# Patient Record
Sex: Female | Born: 1995 | Hispanic: No | Marital: Single | Smoking: Former smoker
Health system: Southern US, Community
[De-identification: ages and names within clinical notes are randomized; demographics above are authoritative.]

## PROBLEM LIST (undated history)

## (undated) ENCOUNTER — Inpatient Hospital Stay (HOSPITAL_COMMUNITY): Payer: Self-pay

## (undated) DIAGNOSIS — R569 Unspecified convulsions: Secondary | ICD-10-CM

## (undated) DIAGNOSIS — A419 Sepsis, unspecified organism: Secondary | ICD-10-CM

## (undated) DIAGNOSIS — I10 Essential (primary) hypertension: Secondary | ICD-10-CM

## (undated) DIAGNOSIS — F419 Anxiety disorder, unspecified: Secondary | ICD-10-CM

## (undated) DIAGNOSIS — J45909 Unspecified asthma, uncomplicated: Secondary | ICD-10-CM

## (undated) DIAGNOSIS — F32A Depression, unspecified: Secondary | ICD-10-CM

## (undated) DIAGNOSIS — F329 Major depressive disorder, single episode, unspecified: Secondary | ICD-10-CM

## (undated) DIAGNOSIS — N12 Tubulo-interstitial nephritis, not specified as acute or chronic: Secondary | ICD-10-CM

## (undated) HISTORY — DX: Essential (primary) hypertension: I10

## (undated) HISTORY — DX: Unspecified convulsions: R56.9

## (undated) HISTORY — PX: NO PAST SURGERIES: SHX2092

## (undated) HISTORY — PX: OTHER SURGICAL HISTORY: SHX169

---
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
## 2017-09-10 ENCOUNTER — Emergency Department (HOSPITAL_BASED_OUTPATIENT_CLINIC_OR_DEPARTMENT_OTHER)
Admission: EM | Admit: 2017-09-10 | Discharge: 2017-09-10 | Disposition: A | Discharge status: Home / Self Care | Payer: Self-pay | Attending: Emergency Medicine | Admitting: Emergency Medicine

## 2017-09-10 ENCOUNTER — Encounter (HOSPITAL_BASED_OUTPATIENT_CLINIC_OR_DEPARTMENT_OTHER): Payer: Self-pay | Admitting: Emergency Medicine

## 2017-09-10 ENCOUNTER — Other Ambulatory Visit: Payer: Self-pay

## 2017-09-10 DIAGNOSIS — M791 Myalgia, unspecified site: Secondary | ICD-10-CM | POA: Insufficient documentation

## 2017-09-10 DIAGNOSIS — R0981 Nasal congestion: Secondary | ICD-10-CM | POA: Insufficient documentation

## 2017-09-10 DIAGNOSIS — B349 Viral infection, unspecified: Secondary | ICD-10-CM | POA: Insufficient documentation

## 2017-09-10 DIAGNOSIS — Z79899 Other long term (current) drug therapy: Secondary | ICD-10-CM | POA: Insufficient documentation

## 2017-09-10 DIAGNOSIS — R112 Nausea with vomiting, unspecified: Secondary | ICD-10-CM | POA: Insufficient documentation

## 2017-09-10 DIAGNOSIS — F17210 Nicotine dependence, cigarettes, uncomplicated: Secondary | ICD-10-CM | POA: Insufficient documentation

## 2017-09-10 LAB — URINALYSIS, ROUTINE W REFLEX MICROSCOPIC
Glucose, UA: NEGATIVE mg/dL
Hgb urine dipstick: NEGATIVE
KETONES UR: 15 mg/dL — AB
Leukocytes, UA: NEGATIVE
NITRITE: NEGATIVE
Protein, ur: 30 mg/dL — AB
Specific Gravity, Urine: 1.01 (ref 1.005–1.030)
pH: 8 (ref 5.0–8.0)

## 2017-09-10 LAB — PREGNANCY, URINE: Preg Test, Ur: NEGATIVE

## 2017-09-10 LAB — URINALYSIS, MICROSCOPIC (REFLEX): RBC / HPF: NONE SEEN RBC/hpf (ref 0–5)

## 2017-09-10 MED ORDER — ONDANSETRON 8 MG PO TBDP
8.0000 mg | ORAL_TABLET | Freq: Once | ORAL | Status: AC
  Administered 2017-09-10: 8 mg via ORAL
  Filled 2017-09-10: qty 1

## 2017-09-10 MED ORDER — ONDANSETRON 8 MG PO TBDP: 8.0000 mg | ORAL_TABLET | Freq: Three times a day (TID) | ORAL | 0 refills | Status: DC | PRN

## 2017-09-10 MED ORDER — IBUPROFEN 400 MG PO TABS
400.0000 mg | ORAL_TABLET | Freq: Once | ORAL | Status: AC
  Administered 2017-09-10: 400 mg via ORAL
  Filled 2017-09-10: qty 1

## 2017-09-10 NOTE — ED Triage Notes (Signed)
Vomiting since 9:30 last night.

## 2017-09-10 NOTE — ED Provider Notes (Signed)
[facility] EMERGENCY DEPARTMENT Provider Note   CSN: [id number] Arrival date & time: 09/10/17  [id number]     History   Chief Complaint Chief Complaint  Patient presents with  . Emesis    HPI EritreaVictoria Michalik is a 22 y.o. female.  Patient with nausea and vomiting in past day. A few episode. Emesis is not bloody or bilious. No diarrhea. Denies abd pain. Also w nasal congestion, and body aches. No fevers. Family member w recent similar symptoms. No known bad food ingestion. Denies headache. Dysuria or gu c/o. Having normal periods.      The history is provided by the patient.  Emesis   Associated symptoms include myalgias. Pertinent negatives include no abdominal pain, no fever and no headaches.    History reviewed. No pertinent past medical history.  There are no active problems to display for this patient.   History reviewed. No pertinent surgical history.  OB History    No data available       Home Medications    Prior to Admission medications   Medication Sig Start Date End Date Taking? Authorizing Provider  norethindrone-ethinyl estradiol (JUNEL FE,GILDESS FE,LOESTRIN FE) 1-20 MG-MCG tablet Take 1 tablet by mouth daily.   Yes [provider]    Family History No family history on file.  Social History Social History   Tobacco Use  . Smoking status: Current Every Day Smoker    Packs/day: 0.50    Types: Cigarettes  . Smokeless tobacco: Never Used  Substance Use Topics  . Alcohol use: Yes    Comment: 2-3 days/week  . Drug use: No     Allergies   Patient has no known allergies.   Review of Systems Review of Systems  Constitutional: Negative for fever.  HENT: Positive for congestion and rhinorrhea. Negative for sore throat.   Eyes: Negative for redness.  Respiratory: Negative for shortness of breath.   Cardiovascular: Negative for chest pain.  Gastrointestinal: Positive for vomiting. Negative for abdominal pain.    Genitourinary: Negative for dysuria and flank pain.  Musculoskeletal: Positive for myalgias. Negative for back pain, neck pain and neck stiffness.  Skin: Negative for rash.  Neurological: Negative for headaches.  Hematological: Does not bruise/bleed easily.  Psychiatric/Behavioral: Negative for confusion.     Physical Exam Updated Vital Signs BP 127/64 (BP Location: Left Arm)   Pulse (!) 112   Temp 98.8 F (37.1 C) (Oral)   Resp 18   Ht 1.702 m (5\' 7" )   Wt 63.5 kg (140 lb)   LMP 09/03/2017 (Approximate)   SpO2 98%   BMI 21.93 kg/m   Physical Exam  Constitutional: She appears well-developed and well-nourished. No distress.  HENT:  Mouth/Throat: Oropharynx is clear and moist.  Nasal congestion  Eyes: Conjunctivae are normal. No scleral icterus.  Neck: Neck supple. No tracheal deviation present.  No stiffness/rigidity  Cardiovascular: Normal rate, regular rhythm, normal heart sounds and intact distal pulses.  No murmur heard. Pulmonary/Chest: Effort normal and breath sounds normal. No respiratory distress.  Abdominal: Soft. Normal appearance. She exhibits no distension. There is no tenderness.  Genitourinary:  Genitourinary Comments: No cva tenderness  Musculoskeletal: She exhibits no edema.  Neurological: She is alert.  Skin: Skin is warm and dry. No rash noted. She is not diaphoretic.  Psychiatric: She has a normal mood and affect.  Nursing note and vitals reviewed.    ED Treatments / Results  Labs (all labs ordered are listed, but only abnormal results are  displayed) Results for orders placed or performed during the hospital encounter of 09/10/17  Urinalysis, Routine w reflex microscopic  Result Value Ref Range   Color, Urine YELLOW YELLOW   APPearance CLEAR CLEAR   Specific Gravity, Urine 1.010 1.005 - 1.030   pH 8.0 5.0 - 8.0   Glucose, UA NEGATIVE NEGATIVE mg/dL   Hgb urine dipstick NEGATIVE NEGATIVE   Bilirubin Urine SMALL (A) NEGATIVE   Ketones, ur 15  (A) NEGATIVE mg/dL   Protein, ur 30 (A) NEGATIVE mg/dL   Nitrite NEGATIVE NEGATIVE   Leukocytes, UA NEGATIVE NEGATIVE  Pregnancy, urine  Result Value Ref Range   Preg Test, Ur NEGATIVE NEGATIVE  Urinalysis, Microscopic (reflex)  Result Value Ref Range   RBC / HPF NONE SEEN 0 - 5 RBC/hpf   WBC, UA 0-5 0 - 5 WBC/hpf   Bacteria, UA RARE (A) NONE SEEN   Squamous Epithelial / LPF 0-5 (A) NONE SEEN    EKG  EKG Interpretation None       Radiology No results found.  Procedures Procedures (including critical care time)  Medications Ordered in ED Medications  ondansetron (ZOFRAN-ODT) disintegrating tablet 8 mg (8 mg Oral Given 09/10/17 0903)  ibuprofen (ADVIL,MOTRIN) tablet 400 mg (400 mg Oral Given 09/10/17 0903)     Initial Impression / Assessment and Plan / ED Course  I have reviewed the triage vital signs and the nursing notes.  Pertinent labs & imaging results that were available during my care of the patient were reviewed by me and considered in my medical decision making (see chart for details).  zofran po.  Po fluids.  Reviewed nursing notes and prior charts for additional history.   Labs reviewed, small ketones on ua.  Pt declines iv. Po fluids.    Pt tolerating po fluids well. abd is soft nt.   Suspect viral illness.  Patient currently appears stable for d/c.     Final Clinical Impressions(s) / ED Diagnoses   Final diagnoses:  None    ED Discharge Orders    None       Cathren Laine, MD 09/10/17 [phone]

## 2017-09-10 NOTE — Discharge Instructions (Signed)
It was our pleasure to provide your ER care today - we hope that you feel better.  Your symptoms appear most consistent with a viral or flu-like illness that should get better over the course of the next few days.  Rest. Drink plenty of fluids.  If nausea, you may take zofran as need.   Take acetaminophen and/or ibuprofen as need for body aches.  Follow up with primary care doctor in 1 week if symptoms fail to improve/resolve.  Return to ER if worse, new symptoms, new or severe  pain, persistent vomiting, trouble breathing, severe abdominal pain, other concern.

## 2017-09-10 NOTE — ED Notes (Signed)
Ginger Ale given  

## 2017-09-10 NOTE — ED Notes (Signed)
ED Provider at bedside. 

## 2018-01-31 ENCOUNTER — Ambulatory Visit: Payer: Managed Care, Other (non HMO) | Admitting: Family Medicine

## 2018-01-31 ENCOUNTER — Telehealth: Payer: Self-pay | Admitting: Family Medicine

## 2018-01-31 ENCOUNTER — Other Ambulatory Visit (HOSPITAL_COMMUNITY)
Admission: RE | Admit: 2018-01-31 | Discharge: 2018-01-31 | Disposition: A | Discharge status: Home / Self Care | Payer: Managed Care, Other (non HMO) | Source: Ambulatory Visit | Attending: Family Medicine | Admitting: Family Medicine

## 2018-01-31 ENCOUNTER — Other Ambulatory Visit: Payer: Self-pay

## 2018-01-31 ENCOUNTER — Encounter: Payer: Self-pay | Admitting: Family Medicine

## 2018-01-31 VITALS — BP 110/80 | HR 86 | Temp 98.9°F | Ht 67.0 in | Wt 148.4 lb

## 2018-01-31 DIAGNOSIS — F411 Generalized anxiety disorder: Secondary | ICD-10-CM | POA: Diagnosis not present

## 2018-01-31 DIAGNOSIS — N6001 Solitary cyst of right breast: Secondary | ICD-10-CM

## 2018-01-31 DIAGNOSIS — Z113 Encounter for screening for infections with a predominantly sexual mode of transmission: Secondary | ICD-10-CM

## 2018-01-31 DIAGNOSIS — Z114 Encounter for screening for human immunodeficiency virus [HIV]: Secondary | ICD-10-CM | POA: Diagnosis not present

## 2018-01-31 DIAGNOSIS — F17290 Nicotine dependence, other tobacco product, uncomplicated: Secondary | ICD-10-CM | POA: Insufficient documentation

## 2018-01-31 DIAGNOSIS — J45909 Unspecified asthma, uncomplicated: Secondary | ICD-10-CM | POA: Insufficient documentation

## 2018-01-31 DIAGNOSIS — Z720 Tobacco use: Secondary | ICD-10-CM | POA: Insufficient documentation

## 2018-01-31 DIAGNOSIS — Z0000 Encounter for general adult medical examination without abnormal findings: Secondary | ICD-10-CM | POA: Insufficient documentation

## 2018-01-31 DIAGNOSIS — B370 Candidal stomatitis: Secondary | ICD-10-CM

## 2018-01-31 DIAGNOSIS — R21 Rash and other nonspecific skin eruption: Secondary | ICD-10-CM

## 2018-01-31 HISTORY — DX: Nicotine dependence, other tobacco product, uncomplicated: F17.290

## 2018-01-31 LAB — TSH: TSH: 2.6 u[IU]/mL (ref 0.35–4.50)

## 2018-01-31 LAB — LIPID PANEL
CHOLESTEROL: 164 mg/dL (ref 0–200)
HDL: 81.4 mg/dL (ref >39.00)
LDL Cholesterol: 58 mg/dL (ref 0–99)
NonHDL: 82.83
TRIGLYCERIDES: 122 mg/dL (ref 0.0–149.0)
Total CHOL/HDL Ratio: 2
VLDL: 24.4 mg/dL (ref 0.0–40.0)

## 2018-01-31 LAB — CBC WITH DIFFERENTIAL/PLATELET
BASOS PCT: 0.7 % (ref 0.0–3.0)
Basophils Absolute: 0 10*3/uL (ref 0.0–0.1)
EOS ABS: 0.1 10*3/uL (ref 0.0–0.7)
Eosinophils Relative: 2.6 % (ref 0.0–5.0)
HEMATOCRIT: 42.5 % (ref 36.0–46.0)
HEMOGLOBIN: 14.5 g/dL (ref 12.0–15.0)
LYMPHS PCT: 24.9 % (ref 12.0–46.0)
Lymphs Abs: 1.2 10*3/uL (ref 0.7–4.0)
MCHC: 34 g/dL (ref 30.0–36.0)
MCV: 107.3 fl — ABNORMAL HIGH (ref 78.0–100.0)
Monocytes Absolute: 0.5 10*3/uL (ref 0.1–1.0)
Monocytes Relative: 9.3 % (ref 3.0–12.0)
Neutro Abs: 3 10*3/uL (ref 1.4–7.7)
Neutrophils Relative %: 62.5 % (ref 43.0–77.0)
Platelets: 215 10*3/uL (ref 150.0–400.0)
RBC: 3.97 Mil/uL (ref 3.87–5.11)
RDW: 18.4 % — AB (ref 11.5–15.5)
WBC: 4.9 10*3/uL (ref 4.0–10.5)

## 2018-01-31 LAB — COMPREHENSIVE METABOLIC PANEL
ALBUMIN: 4.5 g/dL (ref 3.5–5.2)
ALT: 19 U/L (ref 0–35)
AST: 54 U/L — AB (ref 0–37)
Alkaline Phosphatase: 68 U/L (ref 39–117)
BUN: 4 mg/dL — ABNORMAL LOW (ref 6–23)
CALCIUM: 9.5 mg/dL (ref 8.4–10.5)
CHLORIDE: 98 meq/L (ref 96–112)
CO2: 29 mEq/L (ref 19–32)
CREATININE: 0.62 mg/dL (ref 0.40–1.20)
GFR: 128.92 mL/min (ref >60.00)
Glucose, Bld: 83 mg/dL (ref 70–99)
POTASSIUM: 3 meq/L — AB (ref 3.5–5.1)
Sodium: 141 mEq/L (ref 135–145)
Total Bilirubin: 0.6 mg/dL (ref 0.2–1.2)
Total Protein: 7.2 g/dL (ref 6.0–8.3)

## 2018-01-31 MED ORDER — NORETHIN ACE-ETH ESTRAD-FE 1-20 MG-MCG PO TABS: 1.0000 | ORAL_TABLET | Freq: Every day | ORAL | 3 refills | Status: DC

## 2018-01-31 MED ORDER — TRIAMCINOLONE ACETONIDE 0.1 % EX CREA: TOPICAL_CREAM | CUTANEOUS | 1 refills | Status: DC

## 2018-01-31 MED ORDER — NYSTATIN 100000 UNIT/ML MT SUSP: 5.0000 mL | Freq: Four times a day (QID) | OROMUCOSAL | 0 refills | Status: DC

## 2018-01-31 MED ORDER — HYDROXYZINE HCL 25 MG PO TABS: ORAL_TABLET | ORAL | 3 refills | Status: DC

## 2018-01-31 NOTE — Telephone Encounter (Signed)
Copied from CRM [phone]#[id number]. Topic: Quick Communication - See Telephone Encounter >> Jan 31, 2018 12:48 PM Lorrine KinMcGee, Warner Laduca B, NT wrote: CRM for notification. See Telephone encounter for: 01/31/18. Patient's grandmother calling and states that the patient was seen this morning and Dr Artis FlockWolfe told the patient that she would sent something to the pharmacy for vaginal yeast. Patient went to pick up medication and there was nothing therre for her yeast infection. Please advise. [id number] - [place], [place] - [address]  CB#:[phone]

## 2018-01-31 NOTE — Progress Notes (Signed)
Patient: Natasha Pope MRN: [id number] DOB: [birth year] PCP: Orma Flaming, MD     Subjective:  Chief Complaint  Patient presents with  . Establish Care    requesting cpe    HPI: The patient is a 22 y.o. female who presents today for annual exam. She denies any changes to past medical history. There have been no recent hospitalizations. They are following a well balanced diet and exercise plan. Weight has been stable. No complaints today. She just moved from [place], [place]. Looking for jobs and planning to enroll in some college classes. She has concerns for anxiety.   Anxiety: She first noticed this about one year, but just recently got intense. Grandma states she has had anxiety since she was 22 years old. She has had a lot of changes with her parents getting a divorce and moving from [place] to [place]. She is not sure what triggers her anxiety. She has called her grandma crying and can't get out of bed. Not sure why she is crying because she doesn't feel sad. Family history of possible depression in parents, but unsure if this is a clinical diagnosis or just family diagnosis. No SI/HI/Ah/VH. She does take walks when she is feeling anxious. She does not exercise regularly. She does have panic attacks and gets shaky, sweaty, chest pressure, fast heart rate. She states they can last anywhere from short to 1.5 hours. She has never been on any medication for depression or anxiety. She has  Done counseling in the past and didn't really think it helped her anxiety.   Rash: she has had a rash for at least 2 months that started out on her neck and then spread diffusely over her body to her breasts, abdomen. It was purple/dark in color and in "spots." her neck had a scaly appearance to it as well. It really itched her and it burned. It is getting much better.   Right areola issue: she noticed a few days ago that she has little bump on the medial aspect of her right nipple. It looked like  a little bubble and was painful.   Immunization History  Administered Date(s) Administered  . DTaP 01/19/1997, 04/01/1997, 05/31/1997, 01/13/2002  . Hepatitis B 11/18/1996, 01/19/1997, 04/01/1997, 05/31/1997  . HiB (PRP-OMP) 01/19/1997, 04/01/1997  . IPV 01/19/1997, 04/01/1997, 01/13/2002  . MMR 01/13/2002, 03/08/2008  . Meningococcal Conjugate 03/08/2008  . Td 03/08/2008  . Tdap 03/08/2008  . Varicella 03/08/2008    Pap smear: 2016 Tdap: declined today  hiv screen: never, today  Gc/c screen: today    Review of Systems  Constitutional: Negative for activity change, appetite change and fatigue.  Respiratory: Negative for shortness of breath.   Cardiovascular: Negative for chest pain.  Gastrointestinal: Negative for abdominal pain, nausea and vomiting.  Genitourinary: Negative.   Musculoskeletal: Negative for back pain and neck pain.  Neurological: Positive for dizziness. Negative for headaches.  Psychiatric/Behavioral: The patient is nervous/anxious.     Allergies Patient has No Known Allergies.  Past Medical History Patient  has no past medical history on file.  Surgical History Patient  has no past surgical history on file.  Family History [name]'s family history includes Alcohol abuse in her father, maternal grandfather, and paternal grandmother; Depression in her mother; Hyperlipidemia in her maternal grandmother; Hypertension in her father and paternal grandfather; Learning disabilities in her sister; Miscarriages / Stillbirths in her mother.  Social History Patient  reports that she has been smoking cigarettes.  She has been smoking about 0.50  packs per day. She has never used smokeless tobacco. She reports that she drinks alcohol. She reports that she does not use drugs.    Objective: Vitals:   01/31/18 0844  BP: 110/80  Pulse: 86  Temp: 98.9 F (37.2 C)  TempSrc: Oral  SpO2: 98%  Weight: 148 lb 6.4 oz (67.3 kg)  Height: '5\' 7"'$  (1.702 m)    Body mass  index is 23.24 kg/m.  Physical Exam  Constitutional: She is oriented to person, place, and time. She appears well-developed and well-nourished.  HENT:  Right Ear: External ear normal.  Left Ear: External ear normal.  Mouth/Throat: Oropharynx is clear and moist.  TM pearly with light reflex bilaterally  thrush on tongue   Eyes: Pupils are equal, round, and reactive to light. Conjunctivae and EOM are normal.  Neck: Normal range of motion. Neck supple. No thyromegaly present.  Cardiovascular: Normal rate, regular rhythm, normal heart sounds and intact distal pulses.  No murmur heard. Pulmonary/Chest: Effort normal and breath sounds normal.  Abdominal: Soft. Bowel sounds are normal. She exhibits no distension. There is no tenderness.  Lymphadenopathy:    She has no cervical adenopathy.  Neurological: She is alert and oriented to person, place, and time. She displays normal reflexes. No cranial nerve deficit. Coordination normal.  Skin: Skin is warm and dry. Rash noted.  She has a slightly scaly, plaque like rash with mild violet color all around her neck. Other areas of rash have cleared up.   Right areola: very small <10m enlarged areolar gland medial aspect of right areola   Psychiatric: She has a normal mood and affect. Her behavior is normal.  Vitals reviewed.      GAD 7 : Generalized Anxiety Score 01/31/2018  Nervous, Anxious, on Edge 2  Control/stop worrying 1  Worry too much - different things 1  Trouble relaxing 1  Restless 2  Easily annoyed or irritable 1  Afraid - awful might happen 1  Total GAD 7 Score 9  Anxiety Difficulty Somewhat difficult    Dep[facility] screen [facility] 01/31/2018 01/31/2018  Decreased Interest 0 -  Down, Depressed, Hopeless 2 1  PHQ - 2 Score 2 1  Altered sleeping 2 -  Tired, decreased energy 1 -  Change in appetite 0 -  Feeling bad or failure about yourself  1 -  Trouble concentrating 0 -  Moving slowly or fidgety/restless 1 -  Suicidal  thoughts 0 -  PHQ-9 Score 7 -  Difficult doing work/chores Not difficult at all -     Assessment/plan: 1. Encounter for screening for HIV  - HIV antibody  2. Annual physical exam Routine labs. Declined tdap today due to fear. Will try again next time. Needs pap at next visit.  Patient counseling '[x]'$    Nutrition: Stressed importance of moderation in sodium/caffeine intake, saturated fat and cholesterol, caloric balance, sufficient intake of fresh fruits, vegetables, fiber, calcium, iron, and 1 mg of folate supplement per day (for females capable of pregnancy).  '[x]'$    Stressed the importance of regular exercise.   '[x]'$    Substance Abuse: Discussed cessation/primary prevention of tobacco, alcohol, or other drug use; driving or other dangerous activities under the influence; availability of treatment for abuse.   '[x]'$    Injury prevention: Discussed safety belts, safety helmets, smoke detector, smoking near bedding or upholstery.   '[x]'$    Sexuality: Discussed sexually transmitted diseases, partner selection, use of condoms, avoidance of unintended pregnancy  and contraceptive alternatives.  '[x]'$   Dental health: Discussed importance of regular tooth brushing, flossing, and dental visits.  '[x]'$    Health maintenance and immunizations reviewed. Please refer to Health maintenance section.    - Comprehensive metabolic panel - CBC with Differential/Platelet - TSH - Lipid panel  3. Screen for STD (sexually transmitted disease)  - Urine cytology ancillary only; Future - Urine cytology ancillary only  4. GAD (generalized anxiety disorder) I feel like her anxiety is much worse than what her GAD7 score is showing.   She has a score of 9, but is telling me it affects her daily and has bad anxiety. We are going to start her on an as needed medication as she is very hesitant to start a daily medication, even though I think she really needs this. Recommended counseling, but she is not interested in this  either. Recommended daily exercise/yoga as this is the best thing she can do besides counseling for anxiety. I do feel like a lot of this stems from family issues and all of the changes she has been through. Im going to give her a month to see how the hydroxyzine helps her, but if not better in one month am really going to encourage we start a SSRI/daily medication to help with this.   5. Rash I think she has a few different things going on. Rash on body has almost disappeared, but her neck looks more like eczema/dermatitis. We are going to do steroid cream BID and emollient. Will follow up on this in one month. Discussed not to use on face or for prolonged stretches of time.   6. Cyst of right breast Not really a cyst. It's an enlarged areolar gland. Warm compresses TID. If not getting better, growing, more painful she is to return so we can drain.   7. Oral thrush Nystatin oral QID.     Return in about 1 month (around 03/03/2018) for rash/anxiety/pap smear. 45 minutes please .     Orma Flaming, MD [place]  01/31/2018

## 2018-01-31 NOTE — Telephone Encounter (Signed)
See CRM.  Incorrect information in message.  Spoke to patient's TEPPCO Partnersgrandmother-Natasha Pope.  Original message stated that "Dr. Artis FlockWolfe told patient that she would send something to the pharmacy for *vaginal yeast* .  Patient's grandmother confirmed that was incorrect info and that rx was supposed to be sent to pharmacy for oral thrush (yeast).  Spoke to Dr. Artis FlockWolfe and [facility] [address]  Pt's grandmother aware.

## 2018-01-31 NOTE — Patient Instructions (Signed)
Steroid cream: put on neck twice a day for up to 7 days then off for 7 days. Can also use cetaphil cream to help.    Hydroxyzine pill. 1/2-1 tablet up to three times a day as needed for anxiety.

## 2018-02-01 LAB — HIV ANTIBODY (ROUTINE TESTING W REFLEX): HIV: NONREACTIVE

## 2018-02-03 ENCOUNTER — Other Ambulatory Visit: Payer: Self-pay | Admitting: Family Medicine

## 2018-02-03 DIAGNOSIS — D7589 Other specified diseases of blood and blood-forming organs: Secondary | ICD-10-CM

## 2018-02-03 LAB — URINE CYTOLOGY ANCILLARY ONLY
CHLAMYDIA, DNA PROBE: NEGATIVE
Neisseria Gonorrhea: NEGATIVE

## 2018-03-03 ENCOUNTER — Other Ambulatory Visit: Payer: Self-pay | Admitting: Family Medicine

## 2018-03-03 ENCOUNTER — Ambulatory Visit: Payer: Managed Care, Other (non HMO)

## 2018-03-03 ENCOUNTER — Ambulatory Visit (INDEPENDENT_AMBULATORY_CARE_PROVIDER_SITE_OTHER): Payer: Managed Care, Other (non HMO) | Admitting: Family Medicine

## 2018-03-03 ENCOUNTER — Telehealth: Payer: Self-pay | Admitting: Family Medicine

## 2018-03-03 ENCOUNTER — Encounter: Payer: Self-pay | Admitting: Family Medicine

## 2018-03-03 ENCOUNTER — Other Ambulatory Visit: Payer: Managed Care, Other (non HMO)

## 2018-03-03 ENCOUNTER — Other Ambulatory Visit (HOSPITAL_COMMUNITY)
Admission: RE | Admit: 2018-03-03 | Discharge: 2018-03-03 | Disposition: A | Discharge status: Home / Self Care | Payer: Managed Care, Other (non HMO) | Source: Ambulatory Visit | Attending: Family Medicine | Admitting: Family Medicine

## 2018-03-03 ENCOUNTER — Telehealth: Payer: Self-pay

## 2018-03-03 VITALS — BP 132/90 | HR 74 | Temp 98.1°F | Ht 67.0 in | Wt 149.2 lb

## 2018-03-03 DIAGNOSIS — F411 Generalized anxiety disorder: Secondary | ICD-10-CM | POA: Diagnosis not present

## 2018-03-03 DIAGNOSIS — E876 Hypokalemia: Secondary | ICD-10-CM

## 2018-03-03 DIAGNOSIS — E875 Hyperkalemia: Secondary | ICD-10-CM

## 2018-03-03 DIAGNOSIS — D7589 Other specified diseases of blood and blood-forming organs: Secondary | ICD-10-CM

## 2018-03-03 DIAGNOSIS — Z124 Encounter for screening for malignant neoplasm of cervix: Secondary | ICD-10-CM

## 2018-03-03 DIAGNOSIS — R748 Abnormal levels of other serum enzymes: Secondary | ICD-10-CM

## 2018-03-03 DIAGNOSIS — R87610 Atypical squamous cells of undetermined significance on cytologic smear of cervix (ASC-US): Secondary | ICD-10-CM | POA: Insufficient documentation

## 2018-03-03 LAB — COMPREHENSIVE METABOLIC PANEL
ALBUMIN: 4.5 g/dL (ref 3.5–5.2)
ALT: 24 U/L (ref 0–35)
AST: 91 U/L — ABNORMAL HIGH (ref 0–37)
Alkaline Phosphatase: 86 U/L (ref 39–117)
BUN: 4 mg/dL — ABNORMAL LOW (ref 6–23)
CALCIUM: 9.6 mg/dL (ref 8.4–10.5)
CHLORIDE: 97 meq/L (ref 96–112)
CO2: 33 meq/L — AB (ref 19–32)
CREATININE: 0.87 mg/dL (ref 0.40–1.20)
GFR: 87.13 mL/min (ref >60.00)
Glucose, Bld: 95 mg/dL (ref 70–99)
POTASSIUM: 2.8 meq/L — AB (ref 3.5–5.1)
Sodium: 140 mEq/L (ref 135–145)
Total Bilirubin: 1.3 mg/dL — ABNORMAL HIGH (ref 0.2–1.2)
Total Protein: 7.1 g/dL (ref 6.0–8.3)

## 2018-03-03 LAB — FOLATE: FOLATE: 3.2 ng/mL — AB (ref >5.9)

## 2018-03-03 LAB — VITAMIN B12: VITAMIN B 12: 217 pg/mL (ref 211–911)

## 2018-03-03 MED ORDER — POTASSIUM CHLORIDE ER 10 MEQ PO TBCR: 10.0000 meq | EXTENDED_RELEASE_TABLET | Freq: Two times a day (BID) | ORAL | 0 refills | Status: DC

## 2018-03-03 MED ORDER — FOLIC ACID 1 MG PO TABS: 1.0000 mg | ORAL_TABLET | Freq: Every day | ORAL | 1 refills | Status: DC

## 2018-03-03 NOTE — Telephone Encounter (Signed)
CRITICAL VALUE STICKER  CRITICAL VALUE: 2.8  RECEIVER (on-site recipient of call): [employer]Callie Pope.  Verbal result given to [facility], CMA.    DATE & TIME NOTIFIED: 03/03/18 @ 3:20 pm  MESSENGER (representative from Pope): Natasha [facility]  MD NOTIFIED: yes-Dr. Orland MustardAllison Pope  TIME OF NOTIFICATION: 3:20 p.m.  RESPONSE:

## 2018-03-03 NOTE — Addendum Note (Signed)
Addended by: Olevia BowensZELLMER, Connar Keating M on: 03/03/2018 04:50 PM   Modules accepted: Orders

## 2018-03-03 NOTE — Telephone Encounter (Signed)
Pt notified verbally in person that potassium [facility].

## 2018-03-03 NOTE — Progress Notes (Signed)
Patient: Natasha Pope MRN: [id number] DOB: 05/13/1996 PCP: Orland MustardWolfe, Ahlia Lemanski, MD     Subjective:  Chief Complaint  Patient presents with  . Follow-up    anxiety  . Gynecologic Exam    HPI: The patient is a 22 y.o. female who presents today for anxiety and pap smear as well as follow up for some labs.   Anxiety: I saw her a month ago and her GAd7 score was a 9; however, her anxiety seemed to be much worse than this on a day to day basis. She feels like her anxiety is much better. We started her on hydroxyzine and she really thinks this has helped her. She can take it at work and does well and it doesn't make her drowsy. Her grandmother is with her and can tell a big difference.  She also has a job now.   Gyn history: She had a pap smear in 2016. She states she has had 2 or 3 of them 6 months apart. Menarche age 22 or [age]. Periods are monthly and last for a few days. Period is well controlled on her ocp. She takes her ocp daily at the same time. She went on the ocp for painful periods. Sexually active with one person. Does not use condoms. 1/2 and 1/2. In monogamous relationship for 3-4 years. Gc/C were negative. No vaginal complaints, no pain with sex and no urinary issues. No breast cancer or colon cancer in the family.   Elevated liver enzymes: she had some elevated AST and macrocytosis. She drank a lot of liquor about 2 years ago and drink heavy nightly about one year ago when she was living with her father. She moved out of his house a few months ago and cut down to 1-2 beers/night. When I got her labs back, she cut this out as well. She drinks about one beer every few nights. Her dad is likely an alcoholic and his mother also had an issue with drugs/alcholism. She denies tylenol use and has not used any tylenol since I got her labs back last month.   Headache: she has had a headache for a week. Right temple and radiates to the back of her head. Comes and goes. Not worst headache of  her life, does not wake her up and no focal deficits. Has not taken any medication for this.   Review of Systems  Constitutional: Positive for fatigue.  Respiratory: Negative for shortness of breath.   Cardiovascular: Negative for chest pain.  Gastrointestinal: Positive for diarrhea. Negative for abdominal pain, blood in stool, nausea and vomiting.  Neurological: Positive for headaches. Negative for dizziness.  Psychiatric/Behavioral: Positive for sleep disturbance. The patient is not nervous/anxious.     Allergies Patient has No Known Allergies.  Past Medical History Patient  has no past medical history on file.  Surgical History Patient  has no past surgical history on file.  Family History [name]'s family history includes Alcohol abuse in her father, maternal grandfather, and paternal grandmother; Depression in her mother; Hyperlipidemia in her maternal grandmother; Hypertension in her father and paternal grandfather; Learning disabilities in her sister; Miscarriages / Stillbirths in her mother.  Social History Patient  reports that she has been smoking cigarettes. She has been smoking about 0.50 packs per day. She has never used smokeless tobacco. She reports that she drinks alcohol. She reports that she does not use drugs.    Objective: Vitals:   03/03/18 1002  BP: 132/90  Pulse: 74  Temp: 98.1  F (36.7 C)  TempSrc: Oral  SpO2: 98%  Weight: 149 lb 3.2 oz (67.7 kg)  Height: 5\' 7"  (1.702 m)    Body mass index is 23.37 kg/m.  Physical Exam  Constitutional: She is oriented to person, place, and time. She appears well-developed and well-nourished.  Neck: Normal range of motion. Neck supple. No thyromegaly present.  Cardiovascular: Normal rate, regular rhythm and normal heart sounds.  Pulmonary/Chest: Effort normal and breath sounds normal.  Abdominal: Soft. Bowel sounds are normal.  Genitourinary: Rectum normal, vagina normal and uterus normal. Cervix exhibits  friability. Cervix exhibits no motion tenderness and no discharge. Right adnexum displays no tenderness. Left adnexum displays no tenderness. No vaginal discharge found.  Neurological: She is alert and oriented to person, place, and time.  Skin: Skin is warm and dry. No rash noted.  Vitals reviewed.   GAD 7 : Generalized Anxiety Score 03/03/2018 01/31/2018  Nervous, Anxious, on Edge 1 2  Control/stop worrying 0 1  Worry too much - different things 0 1  Trouble relaxing 0 1  Restless 0 2  Easily annoyed or irritable 1 1  Afraid - awful might happen 0 1  Total GAD 7 Score 2 9  Anxiety Difficulty Somewhat difficult Somewhat difficult    .    Assessment/plan: 1. GAD (generalized anxiety disorder) GAD7 score significantly improved. She is doing well with prn hydroxyzine and even her grandmother agrees. She is well controlled on current prn medication. Want her to keep working on exercise and discussed counseling again. If seems to be uncontrolled can return; otherwise f/u in 6-12 months.   2. Cervical cancer screening Gc/c already checked and negative. Pap today with reflux ascus. Discussed safe sex and that I really want her to use condoms to help decrease risk of hpv, herpes, hiv, etc... - Cytology - PAP  3. Elevated liver enzymes Repeat today. **after labs returned AST trending upwards despite her saying she is not drinking. Will try to add on hepatitis panel and check liver ultrasound. Again abstain from tylenol and all alcohol.  - Comprehensive metabolic panel - Hepatic function panel  4. Macrocytosis Checking folate and b12. Folate found to be decreased. Likely from drinking. Starting folic acid daily. Repeat in 3 months.  - Folate - Vitamin B12  5. Hypokalemia After labs found to have critical potassium at 2.8. I had her come back and repeat potassium as well as check ekg. ekg wnl and repeat potassium was 3.2. I sent in replacement to take daily x 3 days. Will need to watch  this. Increase potassium in diet and may need to work this up.  - EKG 12-Lead - Hepatic function panel  6. Low serum potassium See above.  - Basic metabolic panel      Return in about 6 months (around 09/03/2018).     Orland MustardAllison Alyxandria Wentz, MD [place] [facility]   03/03/2018

## 2018-03-03 NOTE — Telephone Encounter (Signed)
[facility] called with critical lab result. Potassium of 2.8

## 2018-03-03 NOTE — Progress Notes (Signed)
Repeat potassium today. If still low needs to go to ER.

## 2018-03-03 NOTE — Telephone Encounter (Signed)
Hey jen- Can you have her come back for a repeat draw today? She was asymptomatic. We can do EKG as well. We need to send lab stat if we can and if still low will need to go to ER. I put in order for another lab today and ekg.

## 2018-03-03 NOTE — Progress Notes (Signed)
[facility] wnl. Repeat potassium was much improved at 3.2. Sent in 3 days of potassium replacement which was already discussed with patient. Please see lab notes for rest of her labs.

## 2018-03-03 NOTE — Telephone Encounter (Signed)
Called and spoke to patient/grandmother.  Advised of critically low potassium level.  Explained to pt that she needs to come back in to office for repeat potassium level as well as EKG.  Explained that if they could not get here to office before we close at 5 pm, would need to go directly to ED.  Pt's grandmother verbalized understanding.  Will head over to office now.

## 2018-03-03 NOTE — Telephone Encounter (Signed)
Let her know I sent oral potassium replacement over to pharmacy. She will take two/day for the next 3 days. If potassium is critically low again will need to go to ER

## 2018-03-03 NOTE — Patient Instructions (Signed)
Tension Headache A tension headache is pain, pressure, or aching that is felt over the front and sides of your head. These headaches can last from 30 minutes to several days. Follow these instructions at home: Managing pain  Take over-the-counter and prescription medicines only as told by your doctor.  Lie down in a dark, quiet room when you have a headache.  If directed, apply ice to your head and neck area: ? Put ice in a plastic bag. ? Place a towel between your skin and the bag. ? Leave the ice on for 20 minutes, 2-3 times per day.  Use a heating pad or a hot shower to apply heat to your head and neck area as told by your doctor. Eating and drinking  Eat meals on a regular schedule.  Do not drink a lot of alcohol.  Do not use a lot of caffeine, or stop using caffeine. General instructions  Keep all follow-up visits as told by your doctor. This is important.  Keep a journal to find out if certain things bring on headaches. For example, write down: ? What you eat and drink. ? How much sleep you get. ? Any change to your diet or medicines.  Try getting a massage, or doing other things that help you to relax.  Lessen stress.  Sit up straight. Do not tighten (tense) your muscles.  Do not use tobacco products. This includes cigarettes, chewing tobacco, or e-cigarettes. If you need help quitting, ask your doctor.  Exercise regularly as told by your doctor.  Get enough sleep. This may mean 7-9 hours of sleep. Contact a doctor if:  Your symptoms are not helped by medicine.  You have a headache that feels different from your usual headache.  You feel sick to your stomach (nauseous) or you throw up (vomit).  You have a fever. Get help right away if:  Your headache becomes very bad.  You keep throwing up.  You have a stiff neck.  You have trouble seeing.  You have trouble speaking.  You have pain in your eye or ear.  Your muscles are weak or you lose muscle  control.  You lose your balance or you have trouble walking.  You feel like you will pass out (faint) or you pass out.  You have confusion. This information is not intended to replace advice given to you by your health care provider. Make sure you discuss any questions you have with your health care provider. Document Released: 10/03/2009 Document Revised: 03/08/2016 Document Reviewed: 11/01/2014 Elsevier Interactive Patient Education  2018 [employer]  

## 2018-03-03 NOTE — Progress Notes (Signed)
Per orders of Dr. Artis FlockWolfe, pt returned this afternoon 03/03/18 for an ekg and repeat STAT potassium level as well as hepatitis panel due to critically low potassium drawn this morning when patient was seen for office visit.  Results of EKG reviewed with Dr. Artis FlockWolfe.

## 2018-03-04 NOTE — Addendum Note (Signed)
Addended by: Olevia BowensZELLMER, JENNIFER M on: 03/04/2018 09:08 AM   Modules accepted: Orders

## 2018-03-04 NOTE — Progress Notes (Signed)
I called [facility] to add on acute hepatitis panel and the magnesium. If not enough blood was sent they will call and notify.

## 2018-03-05 ENCOUNTER — Other Ambulatory Visit: Payer: Self-pay | Admitting: Family Medicine

## 2018-03-05 DIAGNOSIS — R748 Abnormal levels of other serum enzymes: Secondary | ICD-10-CM

## 2018-03-05 LAB — BASIC METABOLIC PANEL
BUN / CREAT RATIO: 5 (calc) — AB (ref 6–22)
BUN: 4 mg/dL — ABNORMAL LOW (ref 7–25)
CHLORIDE: 98 mmol/L (ref 98–110)
CO2: 32 mmol/L (ref 20–32)
CREATININE: 0.78 mg/dL (ref 0.50–1.10)
Calcium: 9.3 mg/dL (ref 8.6–10.2)
GLUCOSE: 100 mg/dL — AB (ref 65–99)
Potassium: 3.2 mmol/L — ABNORMAL LOW (ref 3.5–5.3)
Sodium: 139 mmol/L (ref 135–146)

## 2018-03-05 LAB — TEST AUTHORIZATION 2

## 2018-03-05 LAB — TEST AUTHORIZATION

## 2018-03-05 LAB — HEPATIC FUNCTION PANEL
AG RATIO: 1.7 (calc) (ref 1.0–2.5)
ALBUMIN MSPROF: 4.3 g/dL (ref 3.6–5.1)
ALKALINE PHOSPHATASE (APISO): 88 U/L (ref 33–115)
ALT: 21 U/L (ref 6–29)
AST: 67 U/L — AB (ref 10–30)
Bilirubin, Direct: 0.3 mg/dL — ABNORMAL HIGH (ref 0.0–0.2)
Globulin: 2.5 g/dL (calc) (ref 1.9–3.7)
Indirect Bilirubin: 0.6 mg/dL (calc) (ref 0.2–1.2)
TOTAL PROTEIN: 6.8 g/dL (ref 6.1–8.1)
Total Bilirubin: 0.9 mg/dL (ref 0.2–1.2)

## 2018-03-05 LAB — HEPATITIS PANEL, ACUTE
HEP A IGM: NONREACTIVE
HEP B S AG: NONREACTIVE
Hep B C IgM: NONREACTIVE
Hepatitis C Ab: NONREACTIVE
SIGNAL TO CUT-OFF: 0.03 (ref <1.00)

## 2018-03-05 LAB — MAGNESIUM: Magnesium: 1.7 mg/dL (ref 1.5–2.5)

## 2018-03-07 ENCOUNTER — Encounter: Payer: Self-pay | Admitting: Family Medicine

## 2018-03-07 ENCOUNTER — Other Ambulatory Visit: Payer: Self-pay

## 2018-03-07 DIAGNOSIS — E876 Hypokalemia: Secondary | ICD-10-CM

## 2018-03-07 DIAGNOSIS — E538 Deficiency of other specified B group vitamins: Secondary | ICD-10-CM

## 2018-03-07 DIAGNOSIS — D539 Nutritional anemia, unspecified: Secondary | ICD-10-CM

## 2018-03-07 DIAGNOSIS — R87610 Atypical squamous cells of undetermined significance on cytologic smear of cervix (ASC-US): Secondary | ICD-10-CM | POA: Insufficient documentation

## 2018-03-07 LAB — CYTOLOGY - PAP
Diagnosis: UNDETERMINED — AB
HPV: NOT DETECTED

## 2018-04-08 ENCOUNTER — Telehealth: Payer: Self-pay | Admitting: Family Medicine

## 2018-04-08 NOTE — Telephone Encounter (Signed)
Copied from CRM [id number]. Topic: Quick Communication - See Telephone Encounter >> Apr 08, 2018  4:04 PM Luanna Coleawoud, Jessica L wrote: CRM for notification. See Telephone encounter for: 04/08/18. Pt called and stated that she is feeling tired. Pt states its hard to get out of bed. Pt would like to talk to Orland Mustardallison Wolfe about this. Pt states she needs some help. She feels like her mind and body is slow. Pt would like energy pills Please advise.

## 2018-04-08 NOTE — Telephone Encounter (Signed)
Called and spoke with patient. She endorses all symptoms in previous call. She states that she really needs an energy pill. She has tried the 5 hr energy drinks and energy pills from stores and she always vomits after taking them. She does endorse intermittent depression and anxiety. She states she has woken up in the middle of the night and can't breath. She denies any suicidal or homicidal thoughts. I explained that Dr Artis FlockWolfe would want to see her prior to recommending any treatment. Patient agreed to schedule an appt for tomorrow morning.

## 2018-04-09 ENCOUNTER — Ambulatory Visit: Payer: Managed Care, Other (non HMO) | Admitting: Family Medicine

## 2018-04-09 DIAGNOSIS — Z0289 Encounter for other administrative examinations: Secondary | ICD-10-CM

## 2018-04-09 NOTE — Telephone Encounter (Signed)
Noted.  Pt has appt today 9/18 @ 9:15 am.

## 2018-04-10 ENCOUNTER — Ambulatory Visit: Payer: Managed Care, Other (non HMO) | Admitting: Family Medicine

## 2018-04-14 ENCOUNTER — Other Ambulatory Visit: Payer: Self-pay | Admitting: Family Medicine

## 2018-04-17 ENCOUNTER — Encounter: Payer: Self-pay | Admitting: Family Medicine

## 2018-05-19 ENCOUNTER — Ambulatory Visit: Payer: Self-pay

## 2018-05-19 ENCOUNTER — Telehealth: Payer: Self-pay | Admitting: Family Medicine

## 2018-05-19 ENCOUNTER — Other Ambulatory Visit: Payer: Self-pay

## 2018-05-19 ENCOUNTER — Encounter: Payer: Self-pay | Admitting: Family Medicine

## 2018-05-19 ENCOUNTER — Emergency Department (HOSPITAL_COMMUNITY): Payer: Managed Care, Other (non HMO)

## 2018-05-19 ENCOUNTER — Other Ambulatory Visit: Payer: Self-pay | Admitting: Family Medicine

## 2018-05-19 ENCOUNTER — Inpatient Hospital Stay (HOSPITAL_COMMUNITY)
Admission: EM | Admit: 2018-05-19 | Discharge: 2018-05-23 | DRG: 872 | Disposition: A | Discharge status: Home / Self Care | Payer: Managed Care, Other (non HMO) | Attending: Family Medicine | Admitting: Family Medicine

## 2018-05-19 ENCOUNTER — Ambulatory Visit (INDEPENDENT_AMBULATORY_CARE_PROVIDER_SITE_OTHER): Payer: Managed Care, Other (non HMO) | Admitting: Family Medicine

## 2018-05-19 ENCOUNTER — Encounter (HOSPITAL_COMMUNITY): Payer: Self-pay

## 2018-05-19 VITALS — BP 96/62 | HR 100 | Temp 100.3°F | Resp 26

## 2018-05-19 DIAGNOSIS — R1011 Right upper quadrant pain: Secondary | ICD-10-CM | POA: Diagnosis not present

## 2018-05-19 DIAGNOSIS — F1010 Alcohol abuse, uncomplicated: Secondary | ICD-10-CM | POA: Diagnosis present

## 2018-05-19 DIAGNOSIS — N10 Acute tubulo-interstitial nephritis: Secondary | ICD-10-CM | POA: Diagnosis present

## 2018-05-19 DIAGNOSIS — A419 Sepsis, unspecified organism: Secondary | ICD-10-CM

## 2018-05-19 DIAGNOSIS — E878 Other disorders of electrolyte and fluid balance, not elsewhere classified: Secondary | ICD-10-CM | POA: Diagnosis present

## 2018-05-19 DIAGNOSIS — N390 Urinary tract infection, site not specified: Secondary | ICD-10-CM

## 2018-05-19 DIAGNOSIS — R402363 Coma scale, best motor response, obeys commands, at hospital admission: Secondary | ICD-10-CM | POA: Diagnosis present

## 2018-05-19 DIAGNOSIS — H538 Other visual disturbances: Secondary | ICD-10-CM | POA: Diagnosis present

## 2018-05-19 DIAGNOSIS — Z8659 Personal history of other mental and behavioral disorders: Secondary | ICD-10-CM

## 2018-05-19 DIAGNOSIS — R402143 Coma scale, eyes open, spontaneous, at hospital admission: Secondary | ICD-10-CM | POA: Diagnosis present

## 2018-05-19 DIAGNOSIS — D696 Thrombocytopenia, unspecified: Secondary | ICD-10-CM

## 2018-05-19 DIAGNOSIS — Z793 Long term (current) use of hormonal contraceptives: Secondary | ICD-10-CM

## 2018-05-19 DIAGNOSIS — Z811 Family history of alcohol abuse and dependence: Secondary | ICD-10-CM

## 2018-05-19 DIAGNOSIS — N12 Tubulo-interstitial nephritis, not specified as acute or chronic: Secondary | ICD-10-CM

## 2018-05-19 DIAGNOSIS — Z720 Tobacco use: Secondary | ICD-10-CM | POA: Diagnosis present

## 2018-05-19 DIAGNOSIS — A4151 Sepsis due to Escherichia coli [E. coli]: Principal | ICD-10-CM | POA: Diagnosis present

## 2018-05-19 DIAGNOSIS — R112 Nausea with vomiting, unspecified: Secondary | ICD-10-CM

## 2018-05-19 DIAGNOSIS — N179 Acute kidney failure, unspecified: Secondary | ICD-10-CM | POA: Diagnosis not present

## 2018-05-19 DIAGNOSIS — E876 Hypokalemia: Secondary | ICD-10-CM

## 2018-05-19 DIAGNOSIS — R6520 Severe sepsis without septic shock: Secondary | ICD-10-CM

## 2018-05-19 DIAGNOSIS — Z789 Other specified health status: Secondary | ICD-10-CM

## 2018-05-19 DIAGNOSIS — D7589 Other specified diseases of blood and blood-forming organs: Secondary | ICD-10-CM | POA: Diagnosis present

## 2018-05-19 DIAGNOSIS — I951 Orthostatic hypotension: Secondary | ICD-10-CM | POA: Diagnosis present

## 2018-05-19 DIAGNOSIS — F411 Generalized anxiety disorder: Secondary | ICD-10-CM | POA: Diagnosis present

## 2018-05-19 DIAGNOSIS — F17210 Nicotine dependence, cigarettes, uncomplicated: Secondary | ICD-10-CM | POA: Diagnosis present

## 2018-05-19 DIAGNOSIS — D539 Nutritional anemia, unspecified: Secondary | ICD-10-CM | POA: Diagnosis present

## 2018-05-19 DIAGNOSIS — D509 Iron deficiency anemia, unspecified: Secondary | ICD-10-CM | POA: Diagnosis present

## 2018-05-19 DIAGNOSIS — J45909 Unspecified asthma, uncomplicated: Secondary | ICD-10-CM | POA: Diagnosis present

## 2018-05-19 DIAGNOSIS — Z818 Family history of other mental and behavioral disorders: Secondary | ICD-10-CM

## 2018-05-19 DIAGNOSIS — R402253 Coma scale, best verbal response, oriented, at hospital admission: Secondary | ICD-10-CM | POA: Diagnosis present

## 2018-05-19 DIAGNOSIS — Z79899 Other long term (current) drug therapy: Secondary | ICD-10-CM

## 2018-05-19 DIAGNOSIS — Z7289 Other problems related to lifestyle: Secondary | ICD-10-CM

## 2018-05-19 DIAGNOSIS — F1090 Alcohol use, unspecified, uncomplicated: Secondary | ICD-10-CM

## 2018-05-19 DIAGNOSIS — F17290 Nicotine dependence, other tobacco product, uncomplicated: Secondary | ICD-10-CM | POA: Diagnosis present

## 2018-05-19 DIAGNOSIS — E538 Deficiency of other specified B group vitamins: Secondary | ICD-10-CM | POA: Diagnosis present

## 2018-05-19 HISTORY — DX: Unspecified asthma, uncomplicated: J45.909

## 2018-05-19 HISTORY — DX: Thrombocytopenia, unspecified: D69.6

## 2018-05-19 LAB — URINALYSIS, ROUTINE W REFLEX MICROSCOPIC
GLUCOSE, UA: NEGATIVE mg/dL
Ketones, ur: NEGATIVE mg/dL
Nitrite: NEGATIVE
RBC / HPF: >50 RBC/hpf — ABNORMAL HIGH (ref 0–5)
Specific Gravity, Urine: 1.026 (ref 1.005–1.030)
pH: 5 (ref 5.0–8.0)

## 2018-05-19 LAB — CBC WITH DIFFERENTIAL/PLATELET
ABS IMMATURE GRANULOCYTES: 0.48 10*3/uL — AB (ref 0.00–0.07)
BASOS ABS: 0 10*3/uL (ref 0.0–0.1)
Basophils Relative: 0 %
EOS PCT: 0 %
Eosinophils Absolute: 0.1 10*3/uL (ref 0.0–0.5)
HCT: 28.3 % — ABNORMAL LOW (ref 36.0–46.0)
Hemoglobin: 9.7 g/dL — ABNORMAL LOW (ref 12.0–15.0)
IMMATURE GRANULOCYTES: 2 %
LYMPHS ABS: 0.9 10*3/uL (ref 0.7–4.0)
LYMPHS PCT: 5 %
MCH: 36.7 pg — ABNORMAL HIGH (ref 26.0–34.0)
MCHC: 34.3 g/dL (ref 30.0–36.0)
MCV: 107.2 fL — ABNORMAL HIGH (ref 80.0–100.0)
Monocytes Absolute: 0.8 10*3/uL (ref 0.1–1.0)
Monocytes Relative: 4 %
NEUTROS ABS: 17.7 10*3/uL — AB (ref 1.7–7.7)
NRBC: 0.2 % (ref 0.0–0.2)
Neutrophils Relative %: 89 %
PLATELETS: 105 10*3/uL — AB (ref 150–400)
RBC: 2.64 MIL/uL — ABNORMAL LOW (ref 3.87–5.11)
RDW: 15.6 % — ABNORMAL HIGH (ref 11.5–15.5)
WBC: 20 10*3/uL — ABNORMAL HIGH (ref 4.0–10.5)

## 2018-05-19 LAB — I-STAT CHEM 8, ED
BUN: 8 mg/dL (ref 6–20)
BUN: 11 mg/dL (ref 6–20)
CALCIUM ION: 0.74 mmol/L — AB (ref 1.15–1.40)
CREATININE: 1.1 mg/dL — AB (ref 0.44–1.00)
Calcium, Ion: 0.76 mmol/L — CL (ref 1.15–1.40)
Chloride: 96 mmol/L — ABNORMAL LOW (ref 98–111)
Chloride: 104 mmol/L (ref 98–111)
Creatinine, Ser: 1.2 mg/dL — ABNORMAL HIGH (ref 0.44–1.00)
GLUCOSE: 88 mg/dL (ref 70–99)
Glucose, Bld: 88 mg/dL (ref 70–99)
HCT: 29 % — ABNORMAL LOW (ref 36.0–46.0)
HEMATOCRIT: 30 % — AB (ref 36.0–46.0)
HEMOGLOBIN: 9.9 g/dL — AB (ref 12.0–15.0)
Hemoglobin: 10.2 g/dL — ABNORMAL LOW (ref 12.0–15.0)
Potassium: 2 mmol/L — CL (ref 3.5–5.1)
Potassium: 3.5 mmol/L (ref 3.5–5.1)
SODIUM: 139 mmol/L (ref 135–145)
Sodium: 137 mmol/L (ref 135–145)
TCO2: 29 mmol/L (ref 22–32)
TCO2: 13 mmol/L — AB (ref 22–32)

## 2018-05-19 LAB — I-STAT CG4 LACTIC ACID, ED
Lactic Acid, Venous: 3.57 mmol/L (ref 0.5–1.9)
Lactic Acid, Venous: 2.29 mmol/L (ref 0.5–1.9)

## 2018-05-19 LAB — COMPREHENSIVE METABOLIC PANEL
ALBUMIN: 2.7 g/dL — AB (ref 3.5–5.0)
ALK PHOS: 61 U/L (ref 38–126)
ALT: 21 U/L (ref 0–44)
ANION GAP: 15 (ref 5–15)
AST: 39 U/L (ref 15–41)
BUN: 10 mg/dL (ref 6–20)
CALCIUM: 6.6 mg/dL — AB (ref 8.9–10.3)
CO2: 26 mmol/L (ref 22–32)
CREATININE: 1.36 mg/dL — AB (ref 0.44–1.00)
Chloride: 99 mmol/L (ref 98–111)
GFR calc Af Amer: >60 mL/min (ref >60)
GFR calc non Af Amer: 55 mL/min — ABNORMAL LOW (ref >60)
GLUCOSE: 92 mg/dL (ref 70–99)
Potassium: <2 mmol/L — CL (ref 3.5–5.1)
Sodium: 140 mmol/L (ref 135–145)
Total Bilirubin: 2.4 mg/dL — ABNORMAL HIGH (ref 0.3–1.2)
Total Protein: 5.6 g/dL — ABNORMAL LOW (ref 6.5–8.1)

## 2018-05-19 LAB — MAGNESIUM
MAGNESIUM: 0.7 mg/dL — AB (ref 1.7–2.4)
Magnesium: 1.5 mg/dL — ABNORMAL LOW (ref 1.7–2.4)

## 2018-05-19 LAB — I-STAT BETA HCG BLOOD, ED (MC, WL, AP ONLY): I-stat hCG, quantitative: 16.5 m[IU]/mL — ABNORMAL HIGH (ref <5)

## 2018-05-19 LAB — POTASSIUM: POTASSIUM: 2.6 mmol/L — AB (ref 3.5–5.1)

## 2018-05-19 LAB — RETICULOCYTES
Immature Retic Fract: 30.9 % — ABNORMAL HIGH (ref 2.3–15.9)
RBC.: 2.48 MIL/uL — ABNORMAL LOW (ref 3.87–5.11)
RETIC CT PCT: 2.5 % (ref 0.4–3.1)
Retic Count, Absolute: 61.3 10*3/uL (ref 19.0–186.0)

## 2018-05-19 LAB — LIPASE, BLOOD: Lipase: 15 U/L (ref 11–51)

## 2018-05-19 LAB — LACTATE DEHYDROGENASE: LDH: 258 U/L — ABNORMAL HIGH (ref 98–192)

## 2018-05-19 LAB — CBG MONITORING, ED: Glucose-Capillary: 98 mg/dL (ref 70–99)

## 2018-05-19 LAB — PHOSPHORUS: Phosphorus: 1.6 mg/dL — ABNORMAL LOW (ref 2.5–4.6)

## 2018-05-19 LAB — HCG, QUANTITATIVE, PREGNANCY: hCG, Beta Chain, Quant, S: <1 m[IU]/mL (ref <5)

## 2018-05-19 MED ORDER — SODIUM CHLORIDE 0.9 % IV BOLUS
1500.0000 mL | Freq: Once | INTRAVENOUS | Status: AC
  Administered 2018-05-19: 1500 mL via INTRAVENOUS

## 2018-05-19 MED ORDER — SODIUM CHLORIDE 0.9 % IV BOLUS
1000.0000 mL | Freq: Once | INTRAVENOUS | Status: AC
  Administered 2018-05-19: 1000 mL via INTRAVENOUS

## 2018-05-19 MED ORDER — SODIUM CHLORIDE 0.9 % IJ SOLN
INTRAMUSCULAR | Status: AC
  Filled 2018-05-19: qty 50

## 2018-05-19 MED ORDER — POTASSIUM CHLORIDE 10 MEQ/100ML IV SOLN
10.0000 meq | Freq: Once | INTRAVENOUS | Status: AC
  Administered 2018-05-19: 10 meq via INTRAVENOUS

## 2018-05-19 MED ORDER — MORPHINE SULFATE (PF) 2 MG/ML IV SOLN: 2.0000 mg | INTRAVENOUS | Status: DC | PRN

## 2018-05-19 MED ORDER — ZOLPIDEM TARTRATE 5 MG PO TABS
5.0000 mg | ORAL_TABLET | Freq: Every evening | ORAL | Status: DC | PRN
  Administered 2018-05-20 – 2018-05-21 (×2): 5 mg via ORAL
  Filled 2018-05-19 (×2): qty 1

## 2018-05-19 MED ORDER — VITAMIN B-1 100 MG PO TABS
100.0000 mg | ORAL_TABLET | Freq: Every day | ORAL | Status: DC
  Administered 2018-05-20 – 2018-05-23 (×4): 100 mg via ORAL
  Filled 2018-05-19 (×4): qty 1

## 2018-05-19 MED ORDER — ACETAMINOPHEN 325 MG PO TABS
650.0000 mg | ORAL_TABLET | Freq: Four times a day (QID) | ORAL | Status: DC | PRN
  Administered 2018-05-20 – 2018-05-22 (×5): 650 mg via ORAL
  Filled 2018-05-19 (×5): qty 2

## 2018-05-19 MED ORDER — ONDANSETRON HCL 4 MG PO TABS: 4.0000 mg | ORAL_TABLET | Freq: Four times a day (QID) | ORAL | Status: DC | PRN

## 2018-05-19 MED ORDER — ACETAMINOPHEN 325 MG PO TABS
650.0000 mg | ORAL_TABLET | Freq: Once | ORAL | Status: AC
  Administered 2018-05-19: 650 mg via ORAL
  Filled 2018-05-19: qty 2

## 2018-05-19 MED ORDER — POTASSIUM CHLORIDE 20 MEQ/15ML (10%) PO SOLN
40.0000 meq | Freq: Once | ORAL | Status: AC
  Administered 2018-05-19: 40 meq via ORAL
  Filled 2018-05-19: qty 30

## 2018-05-19 MED ORDER — CALCIUM GLUCONATE-NACL 2-0.675 GM/100ML-% IV SOLN
2.0000 g | Freq: Once | INTRAVENOUS | Status: AC
  Administered 2018-05-19: 2000 mg via INTRAVENOUS
  Filled 2018-05-19: qty 100

## 2018-05-19 MED ORDER — LORAZEPAM 2 MG/ML IJ SOLN: 1.0000 mg | Freq: Four times a day (QID) | INTRAMUSCULAR | Status: AC | PRN

## 2018-05-19 MED ORDER — ONDANSETRON HCL 4 MG/2ML IJ SOLN
4.0000 mg | Freq: Once | INTRAMUSCULAR | Status: AC
  Administered 2018-05-19: 4 mg via INTRAVENOUS
  Filled 2018-05-19: qty 2

## 2018-05-19 MED ORDER — ONDANSETRON HCL 4 MG/2ML IJ SOLN: 4.0000 mg | Freq: Four times a day (QID) | INTRAMUSCULAR | Status: DC | PRN

## 2018-05-19 MED ORDER — LORAZEPAM 1 MG PO TABS
1.0000 mg | ORAL_TABLET | Freq: Four times a day (QID) | ORAL | Status: AC | PRN
  Administered 2018-05-22 (×2): 1 mg via ORAL
  Filled 2018-05-19 (×3): qty 1

## 2018-05-19 MED ORDER — FENTANYL CITRATE (PF) 100 MCG/2ML IJ SOLN
50.0000 ug | Freq: Once | INTRAMUSCULAR | Status: AC
  Administered 2018-05-19: 50 ug via INTRAVENOUS
  Filled 2018-05-19: qty 2

## 2018-05-19 MED ORDER — LORAZEPAM 2 MG/ML IJ SOLN
0.0000 mg | Freq: Two times a day (BID) | INTRAMUSCULAR | Status: DC
  Administered 2018-05-22: 1 mg via INTRAVENOUS
  Filled 2018-05-19: qty 1

## 2018-05-19 MED ORDER — MAGNESIUM SULFATE 2 GM/50ML IV SOLN
2.0000 g | Freq: Once | INTRAVENOUS | Status: AC
  Administered 2018-05-20: 2 g via INTRAVENOUS
  Filled 2018-05-19: qty 50

## 2018-05-19 MED ORDER — IOPAMIDOL (ISOVUE-300) INJECTION 61%
100.0000 mL | Freq: Once | INTRAVENOUS | Status: AC | PRN
  Administered 2018-05-19: 100 mL via INTRAVENOUS

## 2018-05-19 MED ORDER — HYDROXYZINE HCL 25 MG PO TABS: ORAL_TABLET | ORAL | 3 refills | Status: DC

## 2018-05-19 MED ORDER — MAGNESIUM SULFATE 2 GM/50ML IV SOLN
2.0000 g | Freq: Once | INTRAVENOUS | Status: AC
  Administered 2018-05-19: 2 g via INTRAVENOUS
  Filled 2018-05-19: qty 50

## 2018-05-19 MED ORDER — ALBUTEROL SULFATE (2.5 MG/3ML) 0.083% IN NEBU
2.5000 mg | INHALATION_SOLUTION | Freq: Four times a day (QID) | RESPIRATORY_TRACT | Status: DC | PRN
  Administered 2018-05-22: 2.5 mg via RESPIRATORY_TRACT
  Filled 2018-05-19: qty 3

## 2018-05-19 MED ORDER — SODIUM CHLORIDE 0.9 % IV SOLN
2.0000 g | INTRAVENOUS | Status: DC
  Administered 2018-05-19 – 2018-05-20 (×2): 2 g via INTRAVENOUS
  Filled 2018-05-19 (×2): qty 20
  Filled 2018-05-19: qty 2

## 2018-05-19 MED ORDER — THIAMINE HCL 100 MG/ML IJ SOLN: 100.0000 mg | Freq: Every day | INTRAMUSCULAR | Status: DC

## 2018-05-19 MED ORDER — SODIUM CHLORIDE 0.9 % IV SOLN
INTRAVENOUS | Status: DC
  Administered 2018-05-19 – 2018-05-23 (×5): via INTRAVENOUS

## 2018-05-19 MED ORDER — LORAZEPAM 2 MG/ML IJ SOLN
0.0000 mg | Freq: Four times a day (QID) | INTRAMUSCULAR | Status: AC
  Administered 2018-05-19: 2 mg via INTRAVENOUS
  Administered 2018-05-20 – 2018-05-21 (×4): 1 mg via INTRAVENOUS
  Filled 2018-05-19 (×5): qty 1

## 2018-05-19 MED ORDER — NORETHIN ACE-ETH ESTRAD-FE 1-20 MG-MCG PO TABS: 1.0000 | ORAL_TABLET | Freq: Every day | ORAL | 2 refills | Status: DC

## 2018-05-19 MED ORDER — TRIAMCINOLONE ACETONIDE 0.1 % EX CREA
1.0000 "application " | TOPICAL_CREAM | Freq: Two times a day (BID) | CUTANEOUS | Status: DC | PRN
  Filled 2018-05-19: qty 15

## 2018-05-19 MED ORDER — LORAZEPAM 2 MG/ML IJ SOLN
INTRAMUSCULAR | Status: AC
  Filled 2018-05-19: qty 1

## 2018-05-19 MED ORDER — METRONIDAZOLE IN NACL 5-0.79 MG/ML-% IV SOLN
500.0000 mg | Freq: Three times a day (TID) | INTRAVENOUS | Status: DC
  Filled 2018-05-19: qty 100

## 2018-05-19 MED ORDER — FOLIC ACID 1 MG PO TABS
1.0000 mg | ORAL_TABLET | Freq: Every day | ORAL | Status: DC
  Administered 2018-05-20 – 2018-05-23 (×5): 1 mg via ORAL
  Filled 2018-05-19 (×5): qty 1

## 2018-05-19 MED ORDER — ACETAMINOPHEN 650 MG RE SUPP: 650.0000 mg | Freq: Four times a day (QID) | RECTAL | Status: DC | PRN

## 2018-05-19 MED ORDER — NORETHIN ACE-ETH ESTRAD-FE 1-20 MG-MCG PO TABS: 1.0000 | ORAL_TABLET | Freq: Every day | ORAL | Status: DC

## 2018-05-19 MED ORDER — POTASSIUM CHLORIDE 20 MEQ/15ML (10%) PO SOLN
40.0000 meq | Freq: Once | ORAL | Status: AC
  Administered 2018-05-20: 40 meq via ORAL
  Filled 2018-05-19 (×2): qty 30

## 2018-05-19 MED ORDER — MAGNESIUM SULFATE 50 % IJ SOLN: 2.0000 g | Freq: Once | INTRAVENOUS | Status: DC

## 2018-05-19 MED ORDER — MORPHINE SULFATE (PF) 4 MG/ML IV SOLN: 4.0000 mg | Freq: Once | INTRAVENOUS | Status: DC

## 2018-05-19 MED ORDER — POTASSIUM CHLORIDE 10 MEQ/100ML IV SOLN
10.0000 meq | Freq: Once | INTRAVENOUS | Status: DC
  Filled 2018-05-19: qty 100

## 2018-05-19 MED ORDER — ADULT MULTIVITAMIN W/MINERALS CH
1.0000 | ORAL_TABLET | Freq: Every day | ORAL | Status: DC
  Administered 2018-05-20 – 2018-05-23 (×5): 1 via ORAL
  Filled 2018-05-19 (×5): qty 1

## 2018-05-19 MED ORDER — HYDROXYZINE HCL 25 MG PO TABS
25.0000 mg | ORAL_TABLET | Freq: Three times a day (TID) | ORAL | Status: DC | PRN
  Administered 2018-05-20 – 2018-05-22 (×4): 25 mg via ORAL
  Filled 2018-05-19 (×4): qty 1

## 2018-05-19 MED ORDER — IOPAMIDOL (ISOVUE-300) INJECTION 61%
INTRAVENOUS | Status: AC
  Filled 2018-05-19: qty 100

## 2018-05-19 NOTE — Progress Notes (Addendum)
Patient: Natasha Pope MRN: [id number] DOB: [birth year] PCP: Orland Mustard, MD     Subjective:  Chief Complaint  Patient presents with  . Emesis  . Abdominal Pain  . Headache    HPI: The patient is a 22 y.o. female who presents today for abdominal pain and emesis x 3 days. She woke up 3 days ago and had abdominal pain in the RLQ that transfers to her back. She thinks she has had a temperature as well. She has been throwing up constantly and has thrown up 9x today. Her vomit is yellow and chunky. Pain in her stomach is constant and is rated as 10/10. She is unable to keep fluids down. She can not recall any sick contacts. She came by [facility] and has thrown up in office and is shaking. She stopped drinking 1.5 weeks ago. Has hx of elevated AST and never followed up with imaging/labs.   So weak she can hardly stand..   Review of Systems  Constitutional: Positive for chills and fever.  Respiratory: Negative for cough and shortness of breath.   Cardiovascular: Negative for chest pain.  Gastrointestinal: Positive for abdominal pain, nausea and vomiting. Negative for blood in stool.  Neurological: Positive for dizziness, weakness and light-headedness. Negative for syncope.    Allergies Patient has No Known Allergies.  Past Medical History Patient  has no past medical history on file.  Surgical History Patient  has no past surgical history on file.  Family History [name]'s family history includes Alcohol abuse in her father, maternal grandfather, and paternal grandmother; Depression in her mother; Hyperlipidemia in her maternal grandmother; Hypertension in her father and paternal grandfather; Learning disabilities in her sister; Miscarriages / Stillbirths in her mother.  Social History Patient  reports that she has been smoking cigarettes. She has been smoking about 0.50 packs per day. She has never used smokeless tobacco. She reports that she drinks alcohol. She reports that  she does not use drugs.    Objective: Vitals:   05/19/18 1314  BP: 96/62  Pulse: 100  Resp: (!) 26  Temp: 100.3 F (37.9 C)  TempSrc: Temporal    There is no height or weight on file to calculate BMI.  Physical Exam  Constitutional: She appears ill. She appears distressed.  Eyes: Scleral icterus (very mild ) is present.  Cardiovascular: Regular rhythm and normal heart sounds.  No murmur heard. Tachycardic   Pulmonary/Chest: Effort normal and breath sounds normal.  Abdominal: Bowel sounds are decreased. There is tenderness in the right upper quadrant. There is positive Murphy's sign. There is no rebound, no guarding and no tenderness at McBurney's point.  Negative psoas, negative obturator and negative rvosing sign.   Skin: Capillary refill takes less than 2 seconds.  Vitals reviewed.      Assessment/plan: 1. Intractable vomiting with nausea, unspecified vomiting type Needs IVF. Hypotensive/tachycardic and can't keep anything down. 911 called for transfer to hospital.   2. RUQ pain Concern for cholecystitis. Fever, pain in RUQ and ill appearing.  Needs urgent work up. 911 to transfer to hospital for further care.    3. Alcohol abuse -has hx of alcohol abuse but states she stopped drinking 1.5 weeks ago. Unsure how truthful this is and concern for alcohol withdraw as well.    Return if symptoms worsen or fail to improve.    Orland Mustard, MD [facility]   05/19/2018

## 2018-05-19 NOTE — ED Notes (Signed)
Informed PA JORDYN OF RESULT OF I-STAT LACTIC ACID 3.57

## 2018-05-19 NOTE — ED Triage Notes (Signed)
Per EMS Pt from pcp. 3 days ago, pt had sudden onset of RUQ abdominal pain.  Pt thought it was menstrual cramps, but it never went away.  Pt reports feeling dizzy when sitting/standing up.  Pt has not eaten/drank in 3 days due to N/V.  PCP is concerned for gall-bladder.EMS gave 400 cc.  Initial BP 100/60. Pt's initial pulse was 110 laying down and 140 sitting up

## 2018-05-19 NOTE — ED Notes (Signed)
Patient transported to CT 

## 2018-05-19 NOTE — ED Notes (Signed)
Bed: WA01 Expected date:  Expected time:  Means of arrival:  Comments: EMS- 22yo F, abdominal pain/orthostatic

## 2018-05-19 NOTE — Telephone Encounter (Signed)
Pt seen for appt today and after visit was transported to [facility] via EMS per Dr. Artis Flock.

## 2018-05-19 NOTE — Telephone Encounter (Signed)
Pt. Reports she has had vomiting x 3 days. Has vomited 8 times today and had 3 diarrhea stools. States she is not "keeping anything down, but I am taking sips of water." Voiding without difficulty. C/o pain to right side of abdomen - " My ribs and goes around to my back.My grandmother thinks it's my appendix."  Reports she is "sore all over."States she might have a fever, but does not have a thermometer. Appointment made for today.  Reason for Disposition . [1] MILD or MODERATE vomiting AND [2] present > 48 hours (2 days) (Exception: mild vomiting with associated diarrhea)  Answer Assessment - Initial Assessment Questions 1. VOMITING SEVERITY: "How many times have you vomited in the past 24 hours?"     - MILD:  1 - 2 times/day    - MODERATE: 3 - 5 times/day, decreased oral intake without significant weight loss or symptoms of dehydration    - SEVERE: 6 or more times/day, vomits everything or nearly everything, with significant weight loss, symptoms of dehydration      8 2. ONSET: "When did the vomiting begin?"      3 days ago 3. FLUIDS: "What fluids or food have you vomited up today?" "Have you been able to keep any fluids down?"     Not keeping anything down 4. ABDOMINAL PAIN: "Are your having any abdominal pain?" If yes : "How bad is it and what does it feel like?" (e.g., crampy, dull, intermittent, constant)      Constant pain 5. DIARRHEA: "Is there any diarrhea?" If so, ask: "How many times today?"       Diarrhea x 3 today 6. CONTACTS: "Is there anyone else in the family with the same symptoms?"       No 7. CAUSE: "What do you think is causing your vomiting?"     Unsure 8. HYDRATION STATUS: "Any signs of dehydration?" (e.g., dry mouth [not only dry lips], too weak to stand) "When did you last urinate?"     Voiding well - a little dark 9. OTHER SYMPTOMS: "Do you have any other symptoms?" (e.g., fever, headache, vertigo, vomiting blood or coffee grounds, recent head injury)     Right rib  pain, headache 10. PREGNANCY: "Is there any chance you are pregnant?" "When was your last menstrual period?"       No  Protocols used: [facility]

## 2018-05-19 NOTE — Progress Notes (Signed)
Needs refill of anxiety and birth control. Did at her visit today.

## 2018-05-19 NOTE — ED Notes (Signed)
I gave critical I Stat Chem 8 results to PA [facility]

## 2018-05-19 NOTE — ED Notes (Signed)
Pt has began breathing at a normal rate (respiration 12) with a heart rate of 130. Pt stated she doesn't feel like she is having trouble catching her breath now and no pain. Stated she was just scared earlier because she didn't know what was going on with her body.

## 2018-05-19 NOTE — ED Notes (Signed)
Pt refused second set of cultures.

## 2018-05-19 NOTE — ED Notes (Addendum)
Pt is c/o of 6/10 pain in right flank as well as new onset of "blurry vision". Reports everything "looks like static." Pt assisted to the bathroom. PA notified

## 2018-05-19 NOTE — ED Notes (Addendum)
Natasha Lundborg, MD at bedside. Pt is aware about new onset of blurry vision

## 2018-05-19 NOTE — Telephone Encounter (Signed)
See note  Copied from CRM [id number]. Topic: General - Inquiry >> May 19, 2018  3:03 PM Crist Infante wrote: Reason for CRM: pt saw Dr Artis Flock at pm .Grandmother would like Dr Blair Heys asst to call her.  She wants to know how the visit went today. She is worried about the pt.

## 2018-05-19 NOTE — H&P (Addendum)
History and Physical    Natasha Pope [name]:[id number] DOB: Jul 17, 1996 DOA: 05/19/2018  Referring MD/NP/PA:   PCP: Orland Mustard, MD   Patient coming from:  The patient is coming from home.  At baseline, pt is independent for most of ADL.   Chief Complaint: Right flank pain, fever, chills, dysuria  HPI: Natasha Pope is a 22 y.o. female with medical history significant of asthma, tobacco abuse, who presents with right flank pain, fever, chills, dysuria.  Patient states that she has been having right flank pain, fever, chills for more than 3 days.  The right flank pain is constant, initially 10 out of 10 severity, sharp, radiating to the right upper quadrant, currently 4 out of 10 in severity.  It is associated with nausea, and non-bloody and non-biliary vomiting for more than 30 times in the past 2 days.  She also had 7 times of loose stool bowel movement in the initial 2 days, which has resolved.  Currently no diarrhea.  Patient denies chest pain, shortness breath, cough, wheezing.  She also denies any rectal bleeding, hematuria, hematemesis, vaginal bleeding.  She states that she has blurred  vision in both eyes in the ED, no unilateral weakness, numbness or tingling in her extremities, no facial droop or slurred speech.  She also has symptoms of UTI, including dysuria, burning on urination and increased urinary frequency.  Patient states that he quit smoking 10 days ago, she states that she does not need nicotine patch.  She used to drink 2 beers each day, approximately 4 times each week, but she quit drinking beer a week ago,.  ED Course: pt was found to have positive urinalysis (cloudy appearance, moderate amount of leukocyte, many bacteria, WBC>50), WBC 20.0, lactic acid 3.57-->2.29, hemoglobin dropped from 14. 5 on 01/31/18 to 9.9, lipase normal 15, pregnancy test with beta hCG 16.5, quantitative beta-hCG less than 1, electrolytes disturbance (hypokalemia with potassium  2.0, hypomagnesemia with magnesium 0.7, hypocalcemia with calcium 6.6 which is corrected to 8.0), temperature 100.9, tachycardia, tachypnea, oxygen saturation 92% on room air.  CT abdomen/pelvis showed evidence of right pyelonephritis without hydronephrosis.  Patient is placed on telemetry bed of observation.  Review of Systems:   General: has fevers, chills, no body weight gain, has poor appetite, has fatigue HEENT: has blurry vision, hearing changes or sore throat Respiratory: no dyspnea, coughing, wheezing CV: no chest pain, no palpitations GI: has nausea, vomiting, abdominal pain, no diarrhea, constipation GU: has dysuria, burning on urination, increased urinary frequency, no hematuria. Has right flank pain. Ext: no leg edema Neuro: no unilateral weakness, numbness, or tingling, no hearing loss. Has bilateral blurry vision. Skin: no rash, no skin tear. MSK: No muscle spasm, no deformity, no limitation of range of movement in spin Heme: No easy bruising.  Travel history: No recent long distant travel.  Allergy: No Known Allergies  Past Medical History:  Diagnosis Date  . Asthma     Past Surgical History:  Procedure Laterality Date  . dental procedure      Social History:  reports that she has been smoking cigarettes. She has been smoking about 0.50 packs per day. She has never used smokeless tobacco. She reports that she drinks alcohol. She reports that she does not use drugs.  Family History:  Family History  Problem Relation Age of Onset  . Depression Mother   . Miscarriages / [place] Mother   . Hypertension Father   . Alcohol abuse Father   . Learning disabilities  Sister   . Hyperlipidemia Maternal Grandmother   . Alcohol abuse Maternal Grandfather   . Alcohol abuse Paternal Grandmother   . Hypertension Paternal Grandfather      Prior to Admission medications   Medication Sig Start Date End Date Taking? Authorizing Provider  folic acid (FOLVITE) 1 MG tablet  Take 1 tablet (1 mg total) by mouth daily. 03/03/18  Yes Orland Mustard, MD  hydrOXYzine (ATARAX/VISTARIL) 25 MG tablet Take 1/2-1 tablet up to three times a day as needed for anxiety Patient taking differently: Take 25 mg by mouth 3 (three) times daily as needed for anxiety. Take 1 tablet up to three times a day as needed for anxiety 05/19/18  Yes Orland Mustard, MD  Multiple Vitamins-Minerals (MULTIVITAMIN ADULT PO) Take 1 tablet by mouth daily.   Yes [provider]  norethindrone-ethinyl estradiol (JUNEL FE,GILDESS FE,LOESTRIN FE) 1-20 MG-MCG tablet Take 1 tablet by mouth daily. 05/19/18  Yes Orland Mustard, MD  triamcinolone cream (KENALOG) 0.1 % Can use twice a day for 7 days then off for 7 days Patient taking differently: Apply 1 application topically 2 (two) times daily as needed (dryness).  01/31/18  Yes Orland Mustard, MD  nystatin (MYCOSTATIN) 100000 UNIT/ML suspension Take 5 mLs (500,000 Units total) by mouth 4 (four) times daily. Swish in mouth for as long as possible Patient not taking: Reported on 05/19/2018 01/31/18   Orland Mustard, MD  potassium chloride (K-DUR) 10 MEQ tablet Take 1 tablet (10 mEq total) by mouth 2 (two) times daily. Patient not taking: Reported on 05/19/2018 03/03/18   Orland Mustard, MD    Physical Exam: Vitals:   05/19/18 2000 05/19/18 2002 05/19/18 2030 05/19/18 2100  BP:  112/75 119/73 (!) 107/92  Pulse: (!) 103 (!) 104 (!) 108 (!) 113  Resp:  16 (!) 21 (!) 26  Temp:  98.7 F (37.1 C)    TempSrc:  Oral    SpO2: 99% 100% 100% 100%  Weight:      Height:       General: Not in acute distress HEENT:       Eyes: PERRL, EOMI, no scleral icterus.       ENT: No discharge from the ears and nose, no pharynx injection, no tonsillar enlargement.        Neck: No JVD, no bruit, no mass felt. Heme: No neck lymph node enlargement. Cardiac: S1/S2, RRR, No murmurs, No gallops or rubs. Respiratory: No rales, wheezing, rhonchi or rubs. GI: Soft, nondistended,  nontender, no rebound pain, no organomegaly, BS present. GU: Has positive right CVA tenderness Ext: No pitting leg edema bilaterally. 2+DP/PT pulse bilaterally. Musculoskeletal: No joint deformities, No joint redness or warmth, no limitation of ROM in spin. Skin: No rashes.  Neuro: Alert, oriented X3, cranial nerves II-XII grossly intact, moves all extremities normally.  Psych: Patient is not psychotic, no suicidal or hemocidal ideation.  Labs on Admission: I have personally reviewed following labs and imaging studies  CBC: Recent Labs  Lab 05/19/18 1607 05/19/18 1834 05/19/18 2252  WBC 20.0*  --   --   NEUTROABS 17.7*  --   --   HGB 9.7* 9.9* 10.2*  HCT 28.3* 29.0* 30.0*  MCV 107.2*  --   --   PLT 105*  --   --    Basic Metabolic Panel: Recent Labs  Lab 05/19/18 1607 05/19/18 1834 05/19/18 1839 05/19/18 2252  NA 140 139  --  137  K <2.0* 2.0* 2.6* 3.5  CL 99 96*  --  104  CO2 26  --   --   --   GLUCOSE 92 88  --  88  BUN 10 8  --  11  CREATININE 1.36* 1.20*  --  1.10*  CALCIUM 6.6*  --   --   --   MG  --   --  0.7*  --    GFR: Estimated Creatinine Clearance: 78.7 mL/min (A) (by C-G formula based on SCr of 1.1 mg/dL (H)). Liver Function Tests: Recent Labs  Lab 05/19/18 1607  AST 39  ALT 21  ALKPHOS 61  BILITOT 2.4*  PROT 5.6*  ALBUMIN 2.7*   Recent Labs  Lab 05/19/18 1607  LIPASE 15   No results for input(s): AMMONIA in the last 168 hours. Coagulation Profile: No results for input(s): INR, PROTIME in the last 168 hours. Cardiac Enzymes: No results for input(s): CKTOTAL, CKMB, CKMBINDEX, TROPONINI in the last 168 hours. BNP (last 3 results) No results for input(s): PROBNP in the last 8760 hours. HbA1C: No results for input(s): HGBA1C in the last 72 hours. CBG: Recent Labs  Lab 05/19/18 1521  GLUCAP 98   Lipid Profile: No results for input(s): CHOL, HDL, LDLCALC, TRIG, CHOLHDL, LDLDIRECT in the last 72 hours. Thyroid Function Tests: No results  for input(s): TSH, T4TOTAL, FREET4, T3FREE, THYROIDAB in the last 72 hours. Anemia Panel: No results for input(s): VITAMINB12, FOLATE, FERRITIN, TIBC, IRON, RETICCTPCT in the last 72 hours. Urine analysis:    Component Value Date/Time   COLORURINE AMBER (A) 05/19/2018 1516   APPEARANCEUR CLOUDY (A) 05/19/2018 1516   LABSPEC 1.026 05/19/2018 1516   PHURINE 5.0 05/19/2018 1516   GLUCOSEU NEGATIVE 05/19/2018 1516   HGBUR SMALL (A) 05/19/2018 1516   BILIRUBINUR MODERATE (A) 05/19/2018 1516   KETONESUR NEGATIVE 05/19/2018 1516   PROTEINUR >=300 (A) 05/19/2018 1516   NITRITE NEGATIVE 05/19/2018 1516   LEUKOCYTESUR MODERATE (A) 05/19/2018 1516   Sepsis Labs: @LABRCNTIP (procalcitonin:4,lacticidven:4) )No results found for this or any previous visit (from the past 240 hour(s)).   Radiological Exams on Admission: Ct Abdomen Pelvis W Contrast  Result Date: 05/19/2018 CLINICAL DATA:  Right lower quadrant pain with nausea and vomiting x3 days. EXAM: CT ABDOMEN AND PELVIS WITH CONTRAST TECHNIQUE: Multidetector CT imaging of the abdomen and pelvis was performed using the standard protocol following bolus administration of intravenous contrast. CONTRAST:  ISOVUE-300 IOPAMIDOL (ISOVUE-300) INJECTION 61% COMPARISON:  None. FINDINGS: Lower chest: No acute abnormality. Hepatobiliary: Steatosis of the liver. More focal fatty infiltration about the falciform ligament is also identified. The gallbladder is physiologically distended without stones. Pancreas: Normal Spleen: Normal Adrenals/Urinary Tract: Abnormal striated enhancement pattern of the right kidney with mild to moderate nephromegaly and perinephric fat stranding. Findings suggest the presence of pyelonephritis. No lobar nephronia. No hydroureteronephrosis. The urinary bladder is decompressed. Stomach/Bowel: Stomach is within normal limits. Appendix appears normal. No evidence of bowel wall thickening, distention, or inflammatory changes.  Vascular/Lymphatic: No significant vascular findings are present. No enlarged abdominal or pelvic lymph nodes. Reproductive: Uterus and bilateral adnexa are unremarkable. Other: Trace edema and fluid along the right paracolic gutter. Musculoskeletal: No acute or significant osseous findings. IMPRESSION: Nephromegaly with striated enhancement pattern and perinephric edema about the right kidney consistent with right-sided pyelonephritis. Electronically Signed   By: Tollie Eth M.D.   On: 05/19/2018 18:43     EKG: Independently reviewed.  Sinus rhythm, tachycardia, QTC 453, nonspecific T wave change.  Assessment/Plan Principal Problem:   Acute pyelonephritis Active Problems:   Tobacco  abuse   Asthma   Sepsis (HCC)   Hypokalemia   Hypocalcemia   Hypomagnesemia   AKI (acute kidney injury) (HCC)   Macrocytic anemia   Electrolyte disturbance   Thrombocytopenia (HCC)   Alcohol use   Sepsis due to  acute pyelonephritis: Patient meets criteria for sepsis with leukocytosis, fever, tachycardia and tachypnea.  Elevated lactic acid, 3.57, 2.29.  Currently hemodynamically stable.  - Place on med-surg bed for obs - Ceftriaxone by IV - Follow up results of urine and blood cx and amend antibiotic regimen if needed per sensitivity results - prn Zofran for nausea and moprhin for pain - will get Procalcitonin and trend lactic acid levels per sepsis protocol. - IVF: 2.5L of NS bolus in ED, followed by 125 cc/h -->bolus as needed  Profound electrolyte disturbance: Hypokalemia, Hypocalcemia, Hypomagnesemia; -repleted all-->will give more repletion depending on the repeated Labs -check phosphorus level - Follow-up BMP, magnesium level in the morning  Tobacco abuse and alcohol use: quit drinking 10 days ago, refused nicotine patch -Encouraged pt not to restart smoking again. -start CiWA protocol   Asthma: stable. -prn albuterol nebs  AKI (acute kidney injury) [facility]): Creatinine 1.20, BUN 8.  Likely  due to pyelonephritis, -IV fluid as above -Follow-up renal function by BMP  Macrocytic anemia: Hemoglobin dropped from 14.5 on 01/31/18-9.9.  Patient denies any bleeding.  No vaginal bleeding, rectal bleeding, or hematemesis. -Check anemia panel -Follow-up with CBC  Thrombocytopenia (HCC): Possibly due to ongoing infection.  Platelets 105, no bleeding tendency.  Mental status normal. -Check LDH, peripheral smear  Blurry vision: This is a started in the ED.  Etiology is not clear.  No focal neurologic Findings on physical examination.  Very low risk for stroke.  Possibly due to profound electrolytes disturbance. - Observe closely while correcting electrolytes disturbance.    DVT ppx: SCD Code Status: Full code Family Communication: None at bed side Disposition Plan:  Anticipate discharge back to previous home environment Consults called:  none Admission status: Obs / tele   Date of Service 05/19/2018    Lorretta Harp [facility] Pager [phone]  If 7PM-7AM, please contact night-coverage [email or web address] Password [email or web address] 05/19/2018, 11:14 PM

## 2018-05-19 NOTE — ED Notes (Signed)
Ekg given to Pfeiffer, MD after pt has slowed down breathing

## 2018-05-19 NOTE — ED Notes (Signed)
Pt is refusing any more medication. Toniann Fail, RN- AD at bedside talking to pt and grandmother explaining the importance of medication. MD notified

## 2018-05-19 NOTE — ED Notes (Addendum)
Attempted to call report to 4W. Rn stated she will call right back

## 2018-05-19 NOTE — ED Notes (Addendum)
While giving report to [place], RN on the floor, pt began screaming uncontrollably. Pt began hyperventilating begging for someone to help her stating "I don't feel like myself. I feel like an alien." Pt heart rate at 165 and shaking. MD and PA at bedside. Verbal order for I-stat chem 8 and magnesium to determine new lab values. Pt has been deescalated through verbal communication techniques and heart rate lowering as well as respiration. Pt given 2mg  Ativan total (1mg  per administration) 5 minutes apart per verbal order from [place], MD at 2235 and 2240.

## 2018-05-19 NOTE — Telephone Encounter (Signed)
Called and spoke with patient's grandmother and advised that patient was seen here in office and advised of patient's state when she came in for appt.  Pt was seen and sent via EMS to [facility] ER for further evaluation.  Advised that she contact hospital for further information.  Pt's grandmother verbalized understanding.

## 2018-05-19 NOTE — ED Notes (Addendum)
Report given to Jill Side on the 4th floor, updating her regarding pt condition and letting them know that the pt is on the way up. Toniann Fail, RN-AD is taking pt up and will sit with her to keep her calm

## 2018-05-19 NOTE — ED Provider Notes (Signed)
Natasha Pope Provider Note   CSN: [id number] Arrival date & time: 05/19/18  1440     History   Chief Complaint Chief Complaint  Patient presents with  . Abdominal Pain  . orthostatic hypotension    HPI Natasha Pope is a 22 y.o. female w PMHx asthma, GAD, presenting to the ED from PCP office with acute onset of RUQ abdominal pain and fever since Friday. Pt states she woke up in the middle of the night Friday and went to sit up, felt immediate terrible pain in RUQ. Shortly after began vomiting, vomiting is described as "chunky yellow" and clear. Has had N/V and persistent pain in the RUQ since that time. Pain is constant and intermittently sharp/stabbing, made worse with some movements, sometimes radiates to her back. Has not been able to keep fluids down. Endorses fever all weekend. Intermittent normal loose stools. Today woke up feeling very weak, and went to PCP who sent here with concern for cholecystitis. Pt endorses alcohol use, last drink about 1.5 weeks ago. She states she usually drinks 1-2 beers about 3-4 days out of the week. No hx of DT. Denies urinary sx, pelvic complaints, constipation.  No sick contacts. No medications tried PTA. No hx abdominal surgeries.  The history is provided by the patient and medical records.    Past Medical History:  Diagnosis Date  . Asthma     Patient Active Problem List   Diagnosis Date Noted  . Acute pyelonephritis 05/19/2018  . Sepsis (HCC) 05/19/2018  . Hypokalemia 05/19/2018  . Hypocalcemia 05/19/2018  . Hypomagnesemia 05/19/2018  . AKI (acute kidney injury) (HCC) 05/19/2018  . Macrocytic anemia 05/19/2018  . Electrolyte disturbance 05/19/2018  . Thrombocytopenia (HCC) 05/19/2018  . Alcohol use 05/19/2018  . ASCUS of cervix with negative high risk HPV 03/07/2018  . Tobacco abuse 01/31/2018  . Asthma 01/31/2018  . GAD (generalized anxiety disorder) 01/31/2018    Past Surgical  History:  Procedure Laterality Date  . dental procedure       OB History   None      Home Medications    Prior to Admission medications   Medication Sig Start Date End Date Taking? Authorizing Provider  folic acid (FOLVITE) 1 MG tablet Take 1 tablet (1 mg total) by mouth daily. 03/03/18  Yes Orland Mustard, MD  hydrOXYzine (ATARAX/VISTARIL) 25 MG tablet Take 1/2-1 tablet up to three times a day as needed for anxiety Patient taking differently: Take 25 mg by mouth 3 (three) times daily as needed for anxiety. Take 1 tablet up to three times a day as needed for anxiety 05/19/18  Yes Orland Mustard, MD  Multiple Vitamins-Minerals (MULTIVITAMIN ADULT PO) Take 1 tablet by mouth daily.   Yes [provider]  norethindrone-ethinyl estradiol (JUNEL FE,GILDESS FE,LOESTRIN FE) 1-20 MG-MCG tablet Take 1 tablet by mouth daily. 05/19/18  Yes Orland Mustard, MD  triamcinolone cream (KENALOG) 0.1 % Can use twice a day for 7 days then off for 7 days Patient taking differently: Apply 1 application topically 2 (two) times daily as needed (dryness).  01/31/18  Yes Orland Mustard, MD  nystatin (MYCOSTATIN) 100000 UNIT/ML suspension Take 5 mLs (500,000 Units total) by mouth 4 (four) times daily. Swish in mouth for as long as possible Patient not taking: Reported on 05/19/2018 01/31/18   Orland Mustard, MD  potassium chloride (K-DUR) 10 MEQ tablet Take 1 tablet (10 mEq total) by mouth 2 (two) times daily. Patient not taking: Reported on 05/19/2018  03/03/18   Orland Mustard, MD    Family History Family History  Problem Relation Age of Onset  . Depression Mother   . Miscarriages / [place] Mother   . Hypertension Father   . Alcohol abuse Father   . Learning disabilities Sister   . Hyperlipidemia Maternal Grandmother   . Alcohol abuse Maternal Grandfather   . Alcohol abuse Paternal Grandmother   . Hypertension Paternal Grandfather     Social History Social History   Tobacco Use  . Smoking  status: Current Every Day Smoker    Packs/day: 0.50    Types: Cigarettes  . Smokeless tobacco: Never Used  Substance Use Topics  . Alcohol use: Yes    Comment: 2-3 days/week  . Drug use: No     Allergies   Patient has no known allergies.   Review of Systems Review of Systems  Constitutional: Positive for appetite change, fatigue and fever.  Gastrointestinal: Positive for abdominal pain, diarrhea, nausea and vomiting. Negative for blood in stool and constipation.  Genitourinary: Negative for dysuria, frequency, vaginal bleeding and vaginal discharge.  Neurological: Positive for weakness.  All other systems reviewed and are negative.    Physical Exam Updated Vital Signs BP 125/77 (BP Location: Right Arm)   Pulse (!) 128   Temp 99.6 F (37.6 C) (Oral)   Resp 20   Ht 5\' 7"  (1.702 m)   Wt 70.8 kg   LMP 05/13/2018 (Within Days)   SpO2 91%   BMI 24.45 kg/m   Physical Exam  Constitutional: She is oriented to person, place, and time. She appears well-developed and well-nourished. She appears ill.  Pt is slightly pale-appearing, shaky.   HENT:  Head: Normocephalic and atraumatic.  Mouth/Throat: Oropharynx is clear and moist.  Eyes: Pupils are equal, round, and reactive to light. Conjunctivae and EOM are normal.  Neck: Normal range of motion.  Cardiovascular: Regular rhythm, normal heart sounds and intact distal pulses.  tachycardic  Pulmonary/Chest: Effort normal and breath sounds normal. No respiratory distress.  Abdominal: Soft. Normal appearance and bowel sounds are normal. She exhibits no distension and no mass. There is tenderness (RUQ to right flank) in the right upper quadrant. There is no rebound and no guarding.  Neurological: She is alert and oriented to person, place, and time.  Skin: Skin is warm.  Psychiatric: She has a normal mood and affect. Her behavior is normal.  Nursing note and vitals reviewed.    ED Treatments / Results  Labs (all labs ordered  are listed, but only abnormal results are displayed) Labs Reviewed  COMPREHENSIVE METABOLIC PANEL - Abnormal; Notable for the following components:      Result Value   Potassium <2.0 (*)    Creatinine, Ser 1.36 (*)    Calcium 6.6 (*)    Total Protein 5.6 (*)    Albumin 2.7 (*)    Total Bilirubin 2.4 (*)    GFR calc non Af Amer 55 (*)    All other components within normal limits  CBC WITH DIFFERENTIAL/PLATELET - Abnormal; Notable for the following components:   WBC 20.0 (*)    RBC 2.64 (*)    Hemoglobin 9.7 (*)    HCT 28.3 (*)    MCV 107.2 (*)    MCH 36.7 (*)    RDW 15.6 (*)    Platelets 105 (*)    Neutro Abs 17.7 (*)    Abs Immature Granulocytes 0.48 (*)    All other components within normal limits  URINALYSIS,  ROUTINE W REFLEX MICROSCOPIC - Abnormal; Notable for the following components:   Color, Urine AMBER (*)    APPearance CLOUDY (*)    Hgb urine dipstick SMALL (*)    Bilirubin Urine MODERATE (*)    Protein, ur >=300 (*)    Leukocytes, UA MODERATE (*)    RBC / HPF >50 (*)    WBC, UA >50 (*)    Bacteria, UA MANY (*)    All other components within normal limits  MAGNESIUM - Abnormal; Notable for the following components:   Magnesium 0.7 (*)    All other components within normal limits  POTASSIUM - Abnormal; Notable for the following components:   Potassium 2.6 (*)    All other components within normal limits  LACTATE DEHYDROGENASE - Abnormal; Notable for the following components:   LDH 258 (*)    All other components within normal limits  PHOSPHORUS - Abnormal; Notable for the following components:   Phosphorus 1.6 (*)    All other components within normal limits  RETICULOCYTES - Abnormal; Notable for the following components:   RBC. 2.48 (*)    Immature Retic Fract 30.9 (*)    All other components within normal limits  MAGNESIUM - Abnormal; Notable for the following components:   Magnesium 1.5 (*)    All other components within normal limits  I-STAT BETA HCG  BLOOD, ED ([facility], [facility], [facility] ONLY) - Abnormal; Notable for the following components:   I-stat hCG, quantitative 16.5 (*)    All other components within normal limits  I-STAT CG4 LACTIC ACID, ED - Abnormal; Notable for the following components:   Lactic Acid, Venous 3.57 (*)    All other components within normal limits  I-STAT CG4 LACTIC ACID, ED - Abnormal; Notable for the following components:   Lactic Acid, Venous 2.29 (*)    All other components within normal limits  I-STAT CHEM 8, ED - Abnormal; Notable for the following components:   Potassium 2.0 (*)    Chloride 96 (*)    Creatinine, Ser 1.20 (*)    Calcium, Ion 0.76 (*)    Hemoglobin 9.9 (*)    HCT 29.0 (*)    All other components within normal limits  I-STAT CHEM 8, ED - Abnormal; Notable for the following components:   Creatinine, Ser 1.10 (*)    Calcium, Ion 0.74 (*)    TCO2 13 (*)    Hemoglobin 10.2 (*)    HCT 30.0 (*)    All other components within normal limits  CULTURE, BLOOD (ROUTINE X 2)  CULTURE, BLOOD (ROUTINE X 2)  URINE CULTURE  LIPASE, BLOOD  HCG, QUANTITATIVE, PREGNANCY  SAVE SMEAR (SSMR)  PATHOLOGIST SMEAR REVIEW  VITAMIN B12  FOLATE  IRON AND TIBC  FERRITIN  LACTIC ACID, PLASMA  LACTIC ACID, PLASMA  PROCALCITONIN  PROTIME-INR  APTT  HIV ANTIBODY (ROUTINE TESTING W REFLEX)  MAGNESIUM  BASIC METABOLIC PANEL  CBG MONITORING, ED    EKG None  Radiology Ct Abdomen Pelvis W Contrast  Result Date: 05/19/2018 CLINICAL DATA:  Right lower quadrant pain with nausea and vomiting x3 days. EXAM: CT ABDOMEN AND PELVIS WITH CONTRAST TECHNIQUE: Multidetector CT imaging of the abdomen and pelvis was performed using the standard protocol following bolus administration of intravenous contrast. CONTRAST:  ISOVUE-300 IOPAMIDOL (ISOVUE-300) INJECTION 61% COMPARISON:  None. FINDINGS: Lower chest: No acute abnormality. Hepatobiliary: Steatosis of the liver. More focal fatty infiltration about the falciform ligament  is also identified. The gallbladder is physiologically distended  without stones. Pancreas: Normal Spleen: Normal Adrenals/Urinary Tract: Abnormal striated enhancement pattern of the right kidney with mild to moderate nephromegaly and perinephric fat stranding. Findings suggest the presence of pyelonephritis. No lobar nephronia. No hydroureteronephrosis. The urinary bladder is decompressed. Stomach/Bowel: Stomach is within normal limits. Appendix appears normal. No evidence of bowel wall thickening, distention, or inflammatory changes. Vascular/Lymphatic: No significant vascular findings are present. No enlarged abdominal or pelvic lymph nodes. Reproductive: Uterus and bilateral adnexa are unremarkable. Other: Trace edema and fluid along the right paracolic gutter. Musculoskeletal: No acute or significant osseous findings. IMPRESSION: Nephromegaly with striated enhancement pattern and perinephric edema about the right kidney consistent with right-sided pyelonephritis. Electronically Signed   By: Tollie Eth M.D.   On: 05/19/2018 18:43    Procedures Procedures (including critical care time) CRITICAL CARE Performed by: [place] Reyce Lubeck   Total critical care time: 80 minutes  Critical care time was exclusive of separately billable procedures and treating other patients.  Critical care was necessary to treat or prevent imminent or life-threatening deterioration.  Critical care was time spent personally by me on the following activities: development of treatment plan with patient and/or surrogate as well as nursing, discussions with consultants, evaluation of patient's response to treatment, examination of patient, obtaining history from patient or surrogate, ordering and performing treatments and interventions, ordering and review of laboratory studies, ordering and review of radiographic studies, pulse oximetry and re-evaluation of patient's condition multiple times.  Medications Ordered in  ED Medications  cefTRIAXone (ROCEPHIN) 2 g in sodium chloride 0.9 % 100 mL IVPB (0 g Intravenous Stopped 05/19/18 1734)  potassium chloride 10 mEq in 100 mL IVPB (10 mEq Intravenous Not Given 05/19/18 1815)  iopamidol (ISOVUE-300) 61 % injection (has no administration in time range)  sodium chloride 0.9 % injection (has no administration in time range)  0.9 %  sodium chloride infusion ( Intravenous New Bag/Given 05/19/18 2041)  hydrOXYzine (ATARAX/VISTARIL) tablet 25 mg (has no administration in time range)  norethindrone-ethinyl estradiol (JUNEL FE,GILDESS FE,LOESTRIN FE) 1-20 MG-MCG per tablet 1 tablet (1 tablet Oral Not Given 05/19/18 2358)  folic acid (FOLVITE) tablet 1 mg (has no administration in time range)  multivitamin with minerals tablet 1 tablet (has no administration in time range)  triamcinolone cream (KENALOG) 0.1 % 1 application (has no administration in time range)  morphine 2 MG/ML injection 2 mg (has no administration in time range)  albuterol (PROVENTIL) (2.5 MG/3ML) 0.083% nebulizer solution 2.5 mg (has no administration in time range)  acetaminophen (TYLENOL) tablet 650 mg (has no administration in time range)    Or  acetaminophen (TYLENOL) suppository 650 mg (has no administration in time range)  ondansetron (ZOFRAN) tablet 4 mg (has no administration in time range)    Or  ondansetron (ZOFRAN) injection 4 mg (has no administration in time range)  zolpidem (AMBIEN) tablet 5 mg (has no administration in time range)  potassium chloride 20 MEQ/15ML (10%) solution 40 mEq (has no administration in time range)  LORazepam (ATIVAN) 2 MG/ML injection (  Canceled Entry 05/19/18 2353)  LORazepam (ATIVAN) tablet 1 mg (has no administration in time range)    Or  LORazepam (ATIVAN) injection 1 mg (has no administration in time range)  thiamine (VITAMIN B-1) tablet 100 mg (has no administration in time range)    Or  thiamine (B-1) injection 100 mg (has no administration in time  range)  LORazepam (ATIVAN) injection 0-4 mg (2 mg Intravenous Given 05/19/18 2351)    Followed by  LORazepam (ATIVAN) injection 0-4 mg (has no administration in time range)  magnesium sulfate IVPB 2 g 50 mL (has no administration in time range)  sodium chloride 0.9 % bolus 1,000 mL (0 mLs Intravenous Stopped 05/19/18 1746)  ondansetron (ZOFRAN) injection 4 mg (4 mg Intravenous Given 05/19/18 1602)  fentaNYL (SUBLIMAZE) injection 50 mcg (50 mcg Intravenous Given 05/19/18 1603)  acetaminophen (TYLENOL) tablet 650 mg (650 mg Oral Given 05/19/18 1605)  potassium chloride 10 mEq in 100 mL IVPB (0 mEq Intravenous Stopped 05/19/18 1952)  magnesium sulfate IVPB 2 g 50 mL (0 g Intravenous Stopped 05/19/18 1952)  iopamidol (ISOVUE-300) 61 % injection 100 mL (100 mLs Intravenous Contrast Given 05/19/18 1830)  potassium chloride 20 MEQ/15ML (10%) solution 40 mEq (40 mEq Oral Given 05/19/18 2020)  calcium gluconate 2 g/ 100 mL sodium chloride IVPB (2,000 mg Intravenous New Bag/Given 05/19/18 2349)  sodium chloride 0.9 % bolus 1,500 mL (1,500 mLs Intravenous New Bag/Given 05/19/18 2042)     Initial Impression / Assessment and Plan / ED Course  I have reviewed the triage vital signs and the nursing notes.  Pertinent labs & imaging results that were available during my care of the patient were reviewed by me and considered in my medical decision making (see chart for details).  Clinical Course as of Oct 29 0001  Mon May 19, 2018  1810 Pt vital signs improving. Lactic improved   [JR]  1930 Patient reevaluated, reporting improvement in symptoms.  Discussed all results and recommendation for admission for sepsis secondary to pyelonephritis, as well as severe electrolyte disturbances.  Patient agreeable to plan.   [JR]  2300 Pt began crying out in the Ed. At bedside, appears patient is panicking, and may be hallucinating. Pt hyperventilating with tachycardia into 170s. Dr. Donnald Garre at bedside as well. Pt  given ativan with some improvement. Recheck istat chem 8 and mg level.    [JR]  2315 Discussed with Dr. Clyde Lundborg, concern for possible alcohol withdrawal. He will order CIWA protocol.    [JR]  2330 Pt reportedly becoming anxious again, and refusing medications. Administered another dose of ativan, pt does not have mental capacity at this time to make medical decisions.   [JR]    Clinical Course User Index [JR] Tymeer Vaquera, [place] N, PA-C    Pt presenting from PCP office with 3 days of RUQ abd pain, N/V, fever. On arrival, pt is tachycardic to 123, fever 100.34F, BP 115/71. Pt is ill-appearing. Abdomen is soft with tenderness to the RUQ and right flank, no peritoneal signs. Code sepsis initiated. Pt resuscitated with fluids, tylenol, intra-abdominal abx ordered. CT abdomen ordered with concern for acute cholecystitis vs bowel obstruction vs bowel perforation vs pyelonephritis vs intraabdominal abscess. Lactic is 3.57. Significant leukocytosis of 20. Hgb 9.7, down from 14.5 three months ago. Mild AKI. Lipase wnl. U/A suspicious of infection, though patient is currently menstruating. hcg neg. Potassium resulting as critically low,  <2.0, no assoc EKG changes. Two bags of potassium ordered, 2g magnesium. Mg level ordered and critically low as well at 0.7. Calcium low, calcium ionized low, corrected Ca 8. Calcium ordered as well. On re-evaluation, pt reporting improvement in sx. Discussed critical results and recommendation for admission for sepsis d/t pyelonephritis, as well as severe electrolyte disturbances. Pt agreeable to plan.  Dr. Clyde Lundborg with [facility] accepting admission to telemetry.  Pt became hysterical prior to transfer to inpatient bed. Pt hyperventilating and significantly tachycardic. Question hallucinating? Treated with ativan. Repeat K and Mg levels drawn.  Discussed with Dr. Clyde Lundborg, suggesting possibility of EtOH withdraw. CIWA protocol ordered by Dr. Clyde Lundborg.  Pt needed another dose of ativan prior to  transport upstairs.   Patient discussed with and evaluated by Dr. Donnald Garre.  Final Clinical Impressions(s) / ED Diagnoses   Final diagnoses:  Pyelonephritis of right kidney  Sepsis due to urinary tract infection (HCC)  Acute hypokalemia  Hypomagnesemia  Hypocalcemia    ED Discharge Orders    None       Reese Stockman, [place] N, PA-C 05/20/18 2130    Arby Barrette, MD 05/20/18 [phone]

## 2018-05-19 NOTE — ED Notes (Signed)
ED TO INPATIENT HANDOFF REPORT  Name/Age/Gender Natasha Pope 22 y.o. female  Code Status    Code Status Orders  (From admission, onward)         Start     Ordered   05/19/18 2101  Full code  Continuous     05/19/18 2101        Code Status History    This patient has a current code status but no historical code status.      Home/SNF/Other Home  Chief Complaint Abd Pain; Hypotensive  Level of Care/Admitting Diagnosis ED Disposition    ED Disposition Condition [facility] Area: [place] [[id number]]  Level of Care: Telemetry [5]  Admit to tele based on following criteria: Other see comments  Comments: Sepsis, tachycardia, severe electrolyte disturbance  Diagnosis: Acute pyelonephritis [185631]  Admitting Physician: Ivor Costa [[id number]]  Attending Physician: Ivor Costa [[id number]]  PT Class (Do Not Modify): Observation [104]  PT Acc Code (Do Not Modify): Observation [10022]       Medical History History reviewed. No pertinent past medical history.  Allergies No Known Allergies  IV Location/Drains/Wounds Patient Lines/Drains/Airways Status   Active Line/Drains/Airways    Name:   Placement date:   Placement time:   Site:   Days:   Peripheral IV 05/19/18 Left Hand   05/19/18    -    Hand   less than 1          Labs/Imaging Results for orders placed or performed during the hospital encounter of 05/19/18 (from the past 48 hour(s))  Urinalysis, Routine w reflex microscopic     Status: Abnormal   Collection Time: 05/19/18  3:16 PM  Result Value Ref Range   Color, Urine AMBER (A) YELLOW    Comment: BIOCHEMICALS MAY BE AFFECTED BY COLOR   APPearance CLOUDY (A) CLEAR   Specific Gravity, Urine 1.026 1.005 - 1.030   pH 5.0 5.0 - 8.0   Glucose, UA NEGATIVE NEGATIVE mg/dL   Hgb urine dipstick SMALL (A) NEGATIVE   Bilirubin Urine MODERATE (A) NEGATIVE   Ketones, ur NEGATIVE NEGATIVE mg/dL   Protein, ur >=300 (A) NEGATIVE  mg/dL   Nitrite NEGATIVE NEGATIVE   Leukocytes, UA MODERATE (A) NEGATIVE   RBC / HPF >50 (H) 0 - 5 RBC/hpf   WBC, UA >50 (H) 0 - 5 WBC/hpf   Bacteria, UA MANY (A) NONE SEEN   Squamous Epithelial / LPF 0-5 0 - 5   WBC Clumps PRESENT    Mucus PRESENT    Hyaline Casts, UA PRESENT     Comment: Performed at [facility], [place] [address] [place], [place] [address]  CBG monitoring, ED     Status: None   Collection Time: 05/19/18  3:21 PM  Result Value Ref Range   Glucose-Capillary 98 70 - 99 mg/dL  Comprehensive metabolic panel     Status: Abnormal   Collection Time: 05/19/18  4:07 PM  Result Value Ref Range   Sodium 140 135 - 145 mmol/L   Potassium <2.0 (LL) 3.5 - 5.1 mmol/L    Comment: REPEATED TO VERIFY CRITICAL RESULT CALLED TO, READ BACK BY AND VERIFIED WITH: GRIFFEN,M RN '@1804'$  ON 05/19/18 JACKSON,K    Chloride 99 98 - 111 mmol/L   CO2 26 22 - 32 mmol/L   Glucose, Bld 92 70 - 99 mg/dL   BUN 10 6 - 20 mg/dL   Creatinine, Ser 1.36 (H) 0.44 - 1.00 mg/dL  Calcium 6.6 (L) 8.9 - 10.3 mg/dL   Total Protein 5.6 (L) 6.5 - 8.1 g/dL   Albumin 2.7 (L) 3.5 - 5.0 g/dL   AST 39 15 - 41 U/L   ALT 21 0 - 44 U/L   Alkaline Phosphatase 61 38 - 126 U/L   Total Bilirubin 2.4 (H) 0.3 - 1.2 mg/dL   GFR calc non Af Amer 55 (L) >60 mL/min   GFR calc Af Amer >60 >60 mL/min    Comment: (NOTE) The eGFR has been calculated using the CKD EPI equation. This calculation has not been validated in all clinical situations. eGFR's persistently <60 mL/min signify possible Chronic Kidney Disease.    Anion gap 15 5 - 15    Comment: Performed at [facility], [place] [address] [place], [place] [address]  Lipase, blood     Status: None   Collection Time: 05/19/18  4:07 PM  Result Value Ref Range   Lipase 15 11 - 51 U/L    Comment: Performed at [facility], [place] [address], [place], [place] [address]  CBC with Differential     Status: Abnormal    Collection Time: 05/19/18  4:07 PM  Result Value Ref Range   WBC 20.0 (H) 4.0 - 10.5 K/uL   RBC 2.64 (L) 3.87 - 5.11 MIL/uL   Hemoglobin 9.7 (L) 12.0 - 15.0 g/dL   HCT 28.3 (L) 36.0 - 46.0 %   MCV 107.2 (H) 80.0 - 100.0 fL   MCH 36.7 (H) 26.0 - 34.0 pg   MCHC 34.3 30.0 - 36.0 g/dL   RDW 15.6 (H) 11.5 - 15.5 %   Platelets 105 (L) 150 - 400 K/uL    Comment: Immature Platelet Fraction may be clinically indicated, consider ordering this additional test [id number]    nRBC 0.2 0.0 - 0.2 %   Neutrophils Relative % 89 %   Neutro Abs 17.7 (H) 1.7 - 7.7 K/uL   Lymphocytes Relative 5 %   Lymphs Abs 0.9 0.7 - 4.0 K/uL   Monocytes Relative 4 %   Monocytes Absolute 0.8 0.1 - 1.0 K/uL   Eosinophils Relative 0 %   Eosinophils Absolute 0.1 0.0 - 0.5 K/uL   Basophils Relative 0 %   Basophils Absolute 0.0 0.0 - 0.1 K/uL   WBC Morphology MILD LEFT SHIFT (1-5% METAS, OCC MYELO, OCC BANDS)     Comment: WHITE COUNT CONFIRMED ON SMEAR   Smear Review PLATELET COUNT CONFIRMED BY SMEAR    Immature Granulocytes 2 %   Abs Immature Granulocytes 0.48 (H) 0.00 - 0.07 K/uL    Comment: Performed at [facility], [place] [address], [place],  [address]  hCG, quantitative, pregnancy     Status: None   Collection Time: 05/19/18  4:07 PM  Result Value Ref Range   hCG, Beta Chain, Quant, S <1 <5 mIU/mL    Comment:          GEST. AGE      CONC.  (mIU/mL)   <=1 WEEK        5 - 50     2 WEEKS       50 - 500     3 WEEKS       100 - 10,000     4 WEEKS     1,000 - 30,000     5 WEEKS     3,500 - 115,000   6-8 WEEKS     12,000 - 270,000  12 WEEKS     15,000 - 220,000        FEMALE AND NON-PREGNANT FEMALE:     LESS THAN 5 mIU/mL Performed at [facility], [place] [address], [place], [place] [address]   I-Stat beta hCG blood, ED     Status: Abnormal   Collection Time: 05/19/18  4:30 PM  Result Value Ref Range   I-stat hCG, quantitative 16.5 (H) <5 mIU/mL   Comment 3             Comment:   GEST. AGE      CONC.  (mIU/mL)   <=1 WEEK        5 - 50     2 WEEKS       50 - 500     3 WEEKS       100 - 10,000     4 WEEKS     1,000 - 30,000        FEMALE AND NON-PREGNANT FEMALE:     LESS THAN 5 mIU/mL   I-Stat CG4 Lactic Acid, ED     Status: Abnormal   Collection Time: 05/19/18  4:30 PM  Result Value Ref Range   Lactic Acid, Venous 3.57 (HH) 0.5 - 1.9 mmol/L   Comment NOTIFIED PHYSICIAN   I-Stat CG4 Lactic Acid, ED     Status: Abnormal   Collection Time: 05/19/18  6:34 PM  Result Value Ref Range   Lactic Acid, Venous 2.29 (HH) 0.5 - 1.9 mmol/L   Comment NOTIFIED PHYSICIAN   I-Stat Chem 8, ED     Status: Abnormal   Collection Time: 05/19/18  6:34 PM  Result Value Ref Range   Sodium 139 135 - 145 mmol/L   Potassium 2.0 (LL) 3.5 - 5.1 mmol/L   Chloride 96 (L) 98 - 111 mmol/L   BUN 8 6 - 20 mg/dL   Creatinine, Ser 1.20 (H) 0.44 - 1.00 mg/dL   Glucose, Bld 88 70 - 99 mg/dL   Calcium, Ion 0.76 (LL) 1.15 - 1.40 mmol/L   TCO2 29 22 - 32 mmol/L   Hemoglobin 9.9 (L) 12.0 - 15.0 g/dL   HCT 29.0 (L) 36.0 - 46.0 %   Comment NOTIFIED PHYSICIAN   Magnesium     Status: Abnormal   Collection Time: 05/19/18  6:39 PM  Result Value Ref Range   Magnesium 0.7 (LL) 1.7 - 2.4 mg/dL    Comment: CRITICAL RESULT CALLED TO, READ BACK BY AND VERIFIED WITH: OXENDINE,J RN '@1942'$  ON 05/19/18 JACKSON,K Performed at [facility], [place] [address], [place], [place] [address]   Potassium     Status: Abnormal   Collection Time: 05/19/18  6:39 PM  Result Value Ref Range   Potassium 2.6 (LL) 3.5 - 5.1 mmol/L    Comment: SLIGHT HEMOLYSIS CRITICAL RESULT CALLED TO, READ BACK BY AND VERIFIED WITH: OXENDINE,J RN '@1942'$  ON 05/19/18 JACKSON,K Performed at [facility], [place] [address], [place], [place] [address]    Ct Abdomen Pelvis W Contrast  Result Date: 05/19/2018 CLINICAL DATA:  Right lower quadrant pain with nausea and vomiting x3 days. EXAM: CT  ABDOMEN AND PELVIS WITH CONTRAST TECHNIQUE: Multidetector CT imaging of the abdomen and pelvis was performed using the standard protocol following bolus administration of intravenous contrast. CONTRAST:  137m ISOVUE-300 IOPAMIDOL (ISOVUE-300) INJECTION 61% COMPARISON:  None. FINDINGS: Lower chest: No acute abnormality. Hepatobiliary: Steatosis of the liver. More focal fatty infiltration about the falciform ligament is also  identified. The gallbladder is physiologically distended without stones. Pancreas: Normal Spleen: Normal Adrenals/Urinary Tract: Abnormal striated enhancement pattern of the right kidney with mild to moderate nephromegaly and perinephric fat stranding. Findings suggest the presence of pyelonephritis. No lobar nephronia. No hydroureteronephrosis. The urinary bladder is decompressed. Stomach/Bowel: Stomach is within normal limits. Appendix appears normal. No evidence of bowel wall thickening, distention, or inflammatory changes. Vascular/Lymphatic: No significant vascular findings are present. No enlarged abdominal or pelvic lymph nodes. Reproductive: Uterus and bilateral adnexa are unremarkable. Other: Trace edema and fluid along the right paracolic gutter. Musculoskeletal: No acute or significant osseous findings. IMPRESSION: Nephromegaly with striated enhancement pattern and perinephric edema about the right kidney consistent with right-sided pyelonephritis. Electronically Signed   By: Ashley Royalty M.D.   On: 05/19/2018 18:43    Pending Labs Unresulted Labs (From admission, onward)    Start     Ordered   05/19/18 2100  Lactic acid, plasma  STAT Now then every 3 hours,   STAT     05/19/18 2059   05/19/18 2100  Procalcitonin  STAT,   R     05/19/18 2059   05/19/18 2100  Protime-INR  Once,   R     05/19/18 2059   05/19/18 2100  APTT  Once,   R     05/19/18 2059   05/19/18 2059  Phosphorus  Once,   R     05/19/18 2058   05/19/18 2059  Vitamin B12  (Anemia Panel (PNL))  Once,   R      05/19/18 2058   05/19/18 2059  Folate  (Anemia Panel (PNL))  Once,   R     05/19/18 2058   05/19/18 2059  Iron and TIBC  (Anemia Panel (PNL))  Once,   R     05/19/18 2058   05/19/18 2059  Ferritin  (Anemia Panel (PNL))  Once,   R     05/19/18 2058   05/19/18 2059  Reticulocytes  (Anemia Panel (PNL))  Once,   R     05/19/18 2058   05/19/18 2058  Lactate dehydrogenase  Once,   R     05/19/18 2057   05/19/18 2058  Save Smear  Once,   R     05/19/18 2057   05/19/18 2058  Pathologist smear review  Once,   R     05/19/18 2057   05/19/18 1939  Urine culture  Add-on,   STAT     05/19/18 1938   05/19/18 1513  Culture, blood (routine x 2)  BLOOD CULTURE X 2,   STAT     05/19/18 1512          Vitals/Pain Today's Vitals   05/19/18 1957 05/19/18 2000 05/19/18 2002 05/19/18 2031  BP: 125/89  112/75   Pulse:  (!) 103 (!) 104   Resp:   16   Temp:   98.7 F (37.1 C)   TempSrc:   Oral   SpO2:  99% 100%   Weight:      Height:      PainSc:    5     Isolation Precautions No active isolations  Medications Medications  cefTRIAXone (ROCEPHIN) 2 g in sodium chloride 0.9 % 100 mL IVPB (0 g Intravenous Stopped 05/19/18 1734)  potassium chloride 10 mEq in 100 mL IVPB (10 mEq Intravenous Not Given 05/19/18 1815)  iopamidol (ISOVUE-300) 61 % injection (has no administration in time range)  sodium chloride 0.9 % injection (has no  administration in time range)  calcium gluconate 2 g/ 100 mL sodium chloride IVPB (has no administration in time range)  sodium chloride 0.9 % bolus 1,500 mL (1,500 mLs Intravenous New Bag/Given 05/19/18 2042)  0.9 %  sodium chloride infusion ( Intravenous New Bag/Given 05/19/18 2041)  hydrOXYzine (ATARAX/VISTARIL) tablet 25 mg (has no administration in time range)  norethindrone-ethinyl estradiol (JUNEL FE,GILDESS FE,LOESTRIN FE) 1-20 MG-MCG per tablet 1 tablet (has no administration in time range)  folic acid (FOLVITE) tablet 1 mg (has no administration in time  range)  MULTIVITAMIN ADULT TABS (has no administration in time range)  triamcinolone cream (KENALOG) 0.1 % 1 application (has no administration in time range)  morphine 2 MG/ML injection 2 mg (has no administration in time range)  albuterol (PROVENTIL) (2.5 MG/3ML) 0.083% nebulizer solution 2.5 mg (has no administration in time range)  acetaminophen (TYLENOL) tablet 650 mg (has no administration in time range)    Or  acetaminophen (TYLENOL) suppository 650 mg (has no administration in time range)  ondansetron (ZOFRAN) tablet 4 mg (has no administration in time range)    Or  ondansetron (ZOFRAN) injection 4 mg (has no administration in time range)  zolpidem (AMBIEN) tablet 5 mg (has no administration in time range)  sodium chloride 0.9 % bolus 1,000 mL (0 mLs Intravenous Stopped 05/19/18 1746)  ondansetron (ZOFRAN) injection 4 mg (4 mg Intravenous Given 05/19/18 1602)  fentaNYL (SUBLIMAZE) injection 50 mcg (50 mcg Intravenous Given 05/19/18 1603)  acetaminophen (TYLENOL) tablet 650 mg (650 mg Oral Given 05/19/18 1605)  potassium chloride 10 mEq in 100 mL IVPB (0 mEq Intravenous Stopped 05/19/18 1952)  magnesium sulfate IVPB 2 g 50 mL (0 g Intravenous Stopped 05/19/18 1952)  iopamidol (ISOVUE-300) 61 % injection 100 mL (100 mLs Intravenous Contrast Given 05/19/18 1830)  potassium chloride 20 MEQ/15ML (10%) solution 40 mEq (40 mEq Oral Given 05/19/18 2020)    Mobility walks with person assist (standby assist due to dizzy)

## 2018-05-20 ENCOUNTER — Encounter (HOSPITAL_COMMUNITY): Payer: Self-pay

## 2018-05-20 ENCOUNTER — Other Ambulatory Visit: Payer: Self-pay

## 2018-05-20 ENCOUNTER — Inpatient Hospital Stay (HOSPITAL_COMMUNITY): Payer: Managed Care, Other (non HMO)

## 2018-05-20 DIAGNOSIS — H538 Other visual disturbances: Secondary | ICD-10-CM | POA: Diagnosis present

## 2018-05-20 DIAGNOSIS — Z7289 Other problems related to lifestyle: Secondary | ICD-10-CM | POA: Diagnosis not present

## 2018-05-20 DIAGNOSIS — I951 Orthostatic hypotension: Secondary | ICD-10-CM | POA: Diagnosis present

## 2018-05-20 DIAGNOSIS — E878 Other disorders of electrolyte and fluid balance, not elsewhere classified: Secondary | ICD-10-CM | POA: Diagnosis not present

## 2018-05-20 DIAGNOSIS — R402363 Coma scale, best motor response, obeys commands, at hospital admission: Secondary | ICD-10-CM | POA: Diagnosis present

## 2018-05-20 DIAGNOSIS — N179 Acute kidney failure, unspecified: Secondary | ICD-10-CM | POA: Diagnosis present

## 2018-05-20 DIAGNOSIS — Z811 Family history of alcohol abuse and dependence: Secondary | ICD-10-CM | POA: Diagnosis not present

## 2018-05-20 DIAGNOSIS — R402253 Coma scale, best verbal response, oriented, at hospital admission: Secondary | ICD-10-CM | POA: Diagnosis present

## 2018-05-20 DIAGNOSIS — E538 Deficiency of other specified B group vitamins: Secondary | ICD-10-CM | POA: Diagnosis present

## 2018-05-20 DIAGNOSIS — Z793 Long term (current) use of hormonal contraceptives: Secondary | ICD-10-CM | POA: Diagnosis not present

## 2018-05-20 DIAGNOSIS — F1010 Alcohol abuse, uncomplicated: Secondary | ICD-10-CM | POA: Diagnosis present

## 2018-05-20 DIAGNOSIS — Z818 Family history of other mental and behavioral disorders: Secondary | ICD-10-CM | POA: Diagnosis not present

## 2018-05-20 DIAGNOSIS — J4520 Mild intermittent asthma, uncomplicated: Secondary | ICD-10-CM

## 2018-05-20 DIAGNOSIS — E876 Hypokalemia: Secondary | ICD-10-CM | POA: Diagnosis present

## 2018-05-20 DIAGNOSIS — N12 Tubulo-interstitial nephritis, not specified as acute or chronic: Secondary | ICD-10-CM | POA: Diagnosis not present

## 2018-05-20 DIAGNOSIS — D509 Iron deficiency anemia, unspecified: Secondary | ICD-10-CM | POA: Diagnosis present

## 2018-05-20 DIAGNOSIS — A4151 Sepsis due to Escherichia coli [E. coli]: Secondary | ICD-10-CM | POA: Diagnosis present

## 2018-05-20 DIAGNOSIS — J45909 Unspecified asthma, uncomplicated: Secondary | ICD-10-CM | POA: Diagnosis present

## 2018-05-20 DIAGNOSIS — R402143 Coma scale, eyes open, spontaneous, at hospital admission: Secondary | ICD-10-CM | POA: Diagnosis present

## 2018-05-20 DIAGNOSIS — Z8659 Personal history of other mental and behavioral disorders: Secondary | ICD-10-CM | POA: Diagnosis not present

## 2018-05-20 DIAGNOSIS — N10 Acute tubulo-interstitial nephritis: Secondary | ICD-10-CM | POA: Diagnosis present

## 2018-05-20 DIAGNOSIS — F411 Generalized anxiety disorder: Secondary | ICD-10-CM | POA: Diagnosis present

## 2018-05-20 DIAGNOSIS — D696 Thrombocytopenia, unspecified: Secondary | ICD-10-CM | POA: Diagnosis present

## 2018-05-20 DIAGNOSIS — F17210 Nicotine dependence, cigarettes, uncomplicated: Secondary | ICD-10-CM | POA: Diagnosis present

## 2018-05-20 DIAGNOSIS — R7989 Other specified abnormal findings of blood chemistry: Secondary | ICD-10-CM | POA: Diagnosis not present

## 2018-05-20 DIAGNOSIS — Z79899 Other long term (current) drug therapy: Secondary | ICD-10-CM | POA: Diagnosis not present

## 2018-05-20 DIAGNOSIS — D539 Nutritional anemia, unspecified: Secondary | ICD-10-CM | POA: Diagnosis present

## 2018-05-20 LAB — PROTIME-INR
INR: 1.1
PROTHROMBIN TIME: 14.1 s (ref 11.4–15.2)

## 2018-05-20 LAB — BLOOD CULTURE ID PANEL (REFLEXED)
ACINETOBACTER BAUMANNII: NOT DETECTED
CANDIDA GLABRATA: NOT DETECTED
CANDIDA KRUSEI: NOT DETECTED
CANDIDA PARAPSILOSIS: NOT DETECTED
CANDIDA TROPICALIS: NOT DETECTED
Candida albicans: NOT DETECTED
Carbapenem resistance: NOT DETECTED
ESCHERICHIA COLI: DETECTED — AB
Enterobacter cloacae complex: NOT DETECTED
Enterobacteriaceae species: DETECTED — AB
Enterococcus species: NOT DETECTED
Haemophilus influenzae: NOT DETECTED
KLEBSIELLA OXYTOCA: NOT DETECTED
KLEBSIELLA PNEUMONIAE: NOT DETECTED
Listeria monocytogenes: NOT DETECTED
NEISSERIA MENINGITIDIS: NOT DETECTED
PROTEUS SPECIES: NOT DETECTED
Pseudomonas aeruginosa: NOT DETECTED
STREPTOCOCCUS AGALACTIAE: NOT DETECTED
Serratia marcescens: NOT DETECTED
Staphylococcus aureus (BCID): NOT DETECTED
Staphylococcus species: NOT DETECTED
Streptococcus pneumoniae: NOT DETECTED
Streptococcus pyogenes: NOT DETECTED
Streptococcus species: NOT DETECTED

## 2018-05-20 LAB — BASIC METABOLIC PANEL
Anion gap: 10 (ref 5–15)
Anion gap: 12 (ref 5–15)
BUN: 11 mg/dL (ref 6–20)
BUN: 11 mg/dL (ref 6–20)
CHLORIDE: 108 mmol/L (ref 98–111)
CHLORIDE: 106 mmol/L (ref 98–111)
CO2: 20 mmol/L — ABNORMAL LOW (ref 22–32)
CO2: 20 mmol/L — ABNORMAL LOW (ref 22–32)
CREATININE: 1.1 mg/dL — AB (ref 0.44–1.00)
CREATININE: 1.13 mg/dL — AB (ref 0.44–1.00)
Calcium: 6.4 mg/dL — CL (ref 8.9–10.3)
Calcium: 6.5 mg/dL — ABNORMAL LOW (ref 8.9–10.3)
GFR calc Af Amer: >60 mL/min (ref >60)
GFR calc Af Amer: >60 mL/min (ref >60)
GFR calc non Af Amer: >60 mL/min (ref >60)
GFR calc non Af Amer: >60 mL/min (ref >60)
GLUCOSE: 97 mg/dL (ref 70–99)
GLUCOSE: 94 mg/dL (ref 70–99)
POTASSIUM: 2 mmol/L — AB (ref 3.5–5.1)
Potassium: 2.4 mmol/L — CL (ref 3.5–5.1)
SODIUM: 138 mmol/L (ref 135–145)
Sodium: 138 mmol/L (ref 135–145)

## 2018-05-20 LAB — PROCALCITONIN: Procalcitonin: 8.49 ng/mL

## 2018-05-20 LAB — LACTIC ACID, PLASMA
LACTIC ACID, VENOUS: 2.1 mmol/L — AB (ref 0.5–1.9)
LACTIC ACID, VENOUS: 5.8 mmol/L — AB (ref 0.5–1.9)
Lactic Acid, Venous: 2.8 mmol/L (ref 0.5–1.9)
Lactic Acid, Venous: 1.9 mmol/L (ref 0.5–1.9)

## 2018-05-20 LAB — RAPID URINE DRUG SCREEN, HOSP PERFORMED
AMPHETAMINES: NOT DETECTED
Barbiturates: NOT DETECTED
Benzodiazepines: POSITIVE — AB
COCAINE: NOT DETECTED
OPIATES: NOT DETECTED
Tetrahydrocannabinol: NOT DETECTED

## 2018-05-20 LAB — SAVE SMEAR(SSMR), FOR PROVIDER SLIDE REVIEW

## 2018-05-20 LAB — CBC
HEMATOCRIT: 26.5 % — AB (ref 36.0–46.0)
HEMOGLOBIN: 8.8 g/dL — AB (ref 12.0–15.0)
MCH: 36.2 pg — AB (ref 26.0–34.0)
MCHC: 33.2 g/dL (ref 30.0–36.0)
MCV: 109.1 fL — AB (ref 80.0–100.0)
Platelets: 87 10*3/uL — ABNORMAL LOW (ref 150–400)
RBC: 2.43 MIL/uL — ABNORMAL LOW (ref 3.87–5.11)
RDW: 15.9 % — AB (ref 11.5–15.5)
WBC: 16.9 10*3/uL — ABNORMAL HIGH (ref 4.0–10.5)
nRBC: 0.3 % — ABNORMAL HIGH (ref 0.0–0.2)

## 2018-05-20 LAB — D-DIMER, QUANTITATIVE: D-Dimer, Quant: 3.46 ug/mL-FEU — ABNORMAL HIGH (ref 0.00–0.50)

## 2018-05-20 LAB — IRON AND TIBC
Iron: 8 ug/dL — ABNORMAL LOW (ref 28–170)
SATURATION RATIOS: 6 % — AB (ref 10.4–31.8)
TIBC: 127 ug/dL — AB (ref 250–450)
UIBC: 119 ug/dL

## 2018-05-20 LAB — POTASSIUM: Potassium: 2.9 mmol/L — ABNORMAL LOW (ref 3.5–5.1)

## 2018-05-20 LAB — APTT: aPTT: 37 seconds — ABNORMAL HIGH (ref 24–36)

## 2018-05-20 LAB — MAGNESIUM: MAGNESIUM: 2.1 mg/dL (ref 1.7–2.4)

## 2018-05-20 LAB — PATHOLOGIST SMEAR REVIEW

## 2018-05-20 LAB — FERRITIN: FERRITIN: 345 ng/mL — AB (ref 11–307)

## 2018-05-20 LAB — FOLATE: Folate: 2.7 ng/mL — ABNORMAL LOW (ref >5.9)

## 2018-05-20 LAB — VITAMIN B12: Vitamin B-12: 686 pg/mL (ref 180–914)

## 2018-05-20 LAB — HIV ANTIBODY (ROUTINE TESTING W REFLEX): HIV Screen 4th Generation wRfx: NONREACTIVE

## 2018-05-20 MED ORDER — TRAMADOL HCL 50 MG PO TABS
50.0000 mg | ORAL_TABLET | Freq: Three times a day (TID) | ORAL | Status: DC
  Administered 2018-05-20 (×2): 50 mg via ORAL
  Filled 2018-05-20 (×2): qty 1

## 2018-05-20 MED ORDER — POTASSIUM CHLORIDE 10 MEQ/100ML IV SOLN: 10.0000 meq | INTRAVENOUS | Status: DC

## 2018-05-20 MED ORDER — IOPAMIDOL (ISOVUE-370) INJECTION 76%
100.0000 mL | Freq: Once | INTRAVENOUS | Status: AC | PRN
  Administered 2018-05-20: 100 mL via INTRAVENOUS

## 2018-05-20 MED ORDER — TRAMADOL HCL 50 MG PO TABS
50.0000 mg | ORAL_TABLET | Freq: Two times a day (BID) | ORAL | Status: DC
  Administered 2018-05-20: 50 mg via ORAL
  Filled 2018-05-20: qty 1

## 2018-05-20 MED ORDER — POTASSIUM CHLORIDE CRYS ER 20 MEQ PO TBCR
40.0000 meq | EXTENDED_RELEASE_TABLET | Freq: Three times a day (TID) | ORAL | Status: AC
  Administered 2018-05-20 – 2018-05-21 (×3): 40 meq via ORAL
  Filled 2018-05-20 (×3): qty 2

## 2018-05-20 MED ORDER — IOPAMIDOL (ISOVUE-370) INJECTION 76%
INTRAVENOUS | Status: AC
  Filled 2018-05-20: qty 100

## 2018-05-20 MED ORDER — SODIUM CHLORIDE 0.9 % IV BOLUS
500.0000 mL | Freq: Once | INTRAVENOUS | Status: AC
  Administered 2018-05-20: 500 mL via INTRAVENOUS

## 2018-05-20 MED ORDER — SODIUM CHLORIDE 0.9 % IV BOLUS: 500.0000 mL | Freq: Once | INTRAVENOUS | Status: DC

## 2018-05-20 MED ORDER — POTASSIUM CHLORIDE 20 MEQ/15ML (10%) PO SOLN
40.0000 meq | Freq: Once | ORAL | Status: AC
  Administered 2018-05-20: 40 meq via ORAL
  Filled 2018-05-20: qty 30

## 2018-05-20 MED ORDER — IBUPROFEN 800 MG PO TABS
800.0000 mg | ORAL_TABLET | Freq: Four times a day (QID) | ORAL | Status: DC | PRN
  Administered 2018-05-20: 800 mg via ORAL
  Filled 2018-05-20: qty 1

## 2018-05-20 MED ORDER — SODIUM CHLORIDE 0.9 % IJ SOLN
INTRAMUSCULAR | Status: AC
  Filled 2018-05-20: qty 50

## 2018-05-20 MED ORDER — POTASSIUM PHOSPHATES 15 MMOLE/5ML IV SOLN
20.0000 meq | Freq: Once | INTRAVENOUS | Status: AC
  Administered 2018-05-20: 20 meq via INTRAVENOUS
  Filled 2018-05-20: qty 4.55

## 2018-05-20 MED ORDER — SODIUM CHLORIDE 0.9 % IV BOLUS
1000.0000 mL | Freq: Once | INTRAVENOUS | Status: AC
  Administered 2018-05-20: 1000 mL via INTRAVENOUS

## 2018-05-20 MED ORDER — ENOXAPARIN SODIUM 40 MG/0.4ML ~~LOC~~ SOLN
40.0000 mg | SUBCUTANEOUS | Status: DC
  Administered 2018-05-20: 40 mg via SUBCUTANEOUS
  Filled 2018-05-20: qty 0.4

## 2018-05-20 MED ORDER — FERROUS SULFATE 325 (65 FE) MG PO TABS
325.0000 mg | ORAL_TABLET | Freq: Every day | ORAL | Status: DC
  Administered 2018-05-21 – 2018-05-23 (×3): 325 mg via ORAL
  Filled 2018-05-20 (×3): qty 1

## 2018-05-20 MED ORDER — CALCIUM GLUCONATE-NACL 2-0.675 GM/100ML-% IV SOLN
2.0000 g | Freq: Once | INTRAVENOUS | Status: AC
  Administered 2018-05-20: 2000 mg via INTRAVENOUS
  Filled 2018-05-20: qty 100

## 2018-05-20 NOTE — Progress Notes (Signed)
10/29 @ 0305:  Called by primary nurse, in reference to elevated Lactic Acid of 5.8.  Pt admitted with Acute Pyelonephritis on 10/28.  Lactic Acid had previously been at 2.6 at 1839 on the 28th.  Patient's current temp is 100.2.  HR 130s, lowest BP for the last 6 hours has been 113/82, which was current, with the previous ones all having SBP above 100.  Primary nurse did advise me that patient had a "Panic Attack" earlier in ED, which required the assistant director to stay with patient up until an hour ago.  Nurse also advised me that she is on her 3rd bolus, which is to washout the Lactic Acid, boluses not given for low BP.  Patient also has a history of ETOH abuse and is current getting CIWAs q 6 hours.  Dr. Clyde Lundborg aware of all the above.  I advised the primary nurse that if resting HR gets above 150 or SBP gets below 95 to notify me.  Also advised her that I am available at anytime and that if she has any other concerns to please call the rapid response phone at [phone].  Jewel Baize Chance Karam,RN,BSN,MHA,CCRN [facility] ICU/SD  RN IV / Care Coordinator / Rapid Response Nurse Rapid Response Number:  [phone]

## 2018-05-20 NOTE — Progress Notes (Signed)
Admitting MD notified pt now sleeping with HR at 128. Pt up to BR and HR up to 150's. BP 125/77 temp 99.36F oral. Pt has received 4 mg ativan, hydroxyzine PO IVPB calcium and mag. MIVF NS running at 125 mL/hr. Awaiting call back/new orders. Chiropodist of ED at bedside.

## 2018-05-20 NOTE — Progress Notes (Addendum)
D-dimer results paged to Dr. Clyde Lundborg and Merdis Delay, NP.

## 2018-05-20 NOTE — Progress Notes (Signed)
Critical labs paged to Dr. Clyde Lundborg : lactic 2.1, K 2.0 (down from previous), Calcium 6.4

## 2018-05-20 NOTE — Progress Notes (Signed)
Schorr, NP notified of CTA results negative for PE, recent SVT up to 161 bpm. Pt wants to leave AMA.

## 2018-05-20 NOTE — Progress Notes (Signed)
PHARMACY - PHYSICIAN COMMUNICATION CRITICAL VALUE ALERT - BLOOD CULTURE IDENTIFICATION (BCID)  Natasha Pope is an 22 y.o. female who presented to [facility] on 05/19/2018 with a chief complaint of right flank pain, chills/fever, and dysuria.  Assessment:  Admitted with sepsis 2/2 pyelonephritis. BCID resulted with E.coli bacteremia.   Name of physician (or Provider) Contacted: Dr. Margo Aye  Current antibiotics: Ceftriaxone 2 g IV q24h  Changes to prescribed antibiotics recommended:  Patient is on recommended antibiotics - No changes needed. Discussed with MD.  Results for orders placed or performed during the hospital encounter of 05/19/18  Blood Culture ID Panel (Reflexed) (Collected: 05/19/2018  4:07 PM)  Result Value Ref Range   Enterococcus species NOT DETECTED NOT DETECTED   Listeria monocytogenes NOT DETECTED NOT DETECTED   Staphylococcus species NOT DETECTED NOT DETECTED   Staphylococcus aureus (BCID) NOT DETECTED NOT DETECTED   Streptococcus species NOT DETECTED NOT DETECTED   Streptococcus agalactiae NOT DETECTED NOT DETECTED   Streptococcus pneumoniae NOT DETECTED NOT DETECTED   Streptococcus pyogenes NOT DETECTED NOT DETECTED   Acinetobacter baumannii NOT DETECTED NOT DETECTED   Enterobacteriaceae species DETECTED (A) NOT DETECTED   Enterobacter cloacae complex NOT DETECTED NOT DETECTED   Escherichia coli DETECTED (A) NOT DETECTED   Klebsiella oxytoca NOT DETECTED NOT DETECTED   Klebsiella pneumoniae NOT DETECTED NOT DETECTED   Proteus species NOT DETECTED NOT DETECTED   Serratia marcescens NOT DETECTED NOT DETECTED   Carbapenem resistance NOT DETECTED NOT DETECTED   Haemophilus influenzae NOT DETECTED NOT DETECTED   Neisseria meningitidis NOT DETECTED NOT DETECTED   Pseudomonas aeruginosa NOT DETECTED NOT DETECTED   Candida albicans NOT DETECTED NOT DETECTED   Candida glabrata NOT DETECTED NOT DETECTED   Candida krusei NOT DETECTED NOT DETECTED   Candida  parapsilosis NOT DETECTED NOT DETECTED   Candida tropicalis NOT DETECTED NOT DETECTED    Cindi Carbon, PharmD, BCPS Clinical Pharmacist 05/20/2018  11:49 AM

## 2018-05-20 NOTE — Progress Notes (Signed)
Dr. Clyde Lundborg notified of critical lactic acid of 5.8

## 2018-05-20 NOTE — Progress Notes (Addendum)
PROGRESS NOTE  Natasha Pope [name]:[id number] DOB: 11/15/1995 DOA: 05/19/2018 PCP: Orland Mustard, MD  HPI/Recap of past 24 hours: Natasha Pope is a 22 y.o. female with medical history significant of asthma, tobacco abuse,  alcohol use, who presents with right flank pain, fever, chills, dysuria.  CT abdomen pelvis with contrast revealed right pyelonephritis.  On IV Rocephin empirically.  05/20/2018: Patient seen and examined with her mother at bedside.  Reports right flank pain is improving with pain medications.  Nausea is also improving.  Sirs criteriae are persistent.  Patient will require another day of IV antibiotics administration.  Update: Blood culture positive for E. coli.  We will continue Rocephin 2 g daily.  Pharmacy also managing.  Assessment/Plan: Principal Problem:   Acute pyelonephritis Active Problems:   Tobacco abuse   Asthma   Sepsis (HCC)   Hypokalemia   Hypocalcemia   Hypomagnesemia   AKI (acute kidney injury) (HCC)   Macrocytic anemia   Electrolyte disturbance   Thrombocytopenia (HCC)   Alcohol use  Sepsis secondary to acute pyelonephritis and E. coli bacteremia SIRS criteriae persistent with fever T-max 100.8, tachycardia with heart rate of 130, and respiration rate of 24 Continue IV antibiotics Rocephin Continue IV fluid Monitor fever curve and WBC Lactic acidosis is resolving Repeat CBC in the morning  E. coli bacteremia Continue IV antibiotics as stated above Repeat blood cultures tomorrow  Severe hypokalemia Potassium on presentation 2.0 Continue to replete and repeat potassium level this afternoon at 2 PM Magnesium 2.1  Iron deficiency anemia/macrocytic anemia secondary to folate deficiency Start ferrous sulfate 325 mg daily Continue folate supplement  Thrombocytopenia possibly from chronic alcohol use Plt 87K on 05/20/2018 Platelet on 01/31/2018 was 215 K No sign of overt bleeding Repeat CBC in the  morning  Resolving lactic acidosis Lactic acid from 5.8 on presentation to 1.9 today 05/20/18 Continue gentle IV fluid hydration  Alcohol use disorder Continue CIWA protocol  Tobacco use disorder Continue nicotine patch  Risks: High risk for decompensation due to ongoing sepsis and bacteremia with persistent fevers requiring IV antibiotics and IV fluid administration.   Code Status: Full code  Family Communication: Mother at bedside  Disposition Plan: Home possibly 1 to 2 days when clinically stable   Consultants:  None  Procedures:  None  Antimicrobials:  Rocephin  DVT prophylaxis: Subcu Lovenox daily   Objective: Vitals:   05/19/18 2343 05/20/18 0235 05/20/18 0537 05/20/18 0653  BP:  113/82 117/78 112/65  Pulse:  (!) 130 (!) 125 (!) 126  Resp:  20 (!) 24 (!) 22  Temp:  100.2 F (37.9 C) (!) 100.8 F (38.2 C) 100.2 F (37.9 C)  TempSrc:  Oral Oral Oral  SpO2:  100% 97% 99%  Weight: 70.8 kg     Height: 5\' 7"  (1.702 m)       Intake/Output Summary (Last 24 hours) at 05/20/2018 1222 Last data filed at 05/20/2018 0510 Gross per 24 hour  Intake 4157.47 ml  Output -  Net 4157.47 ml   Filed Weights   05/19/18 1521 05/19/18 2343  Weight: 68 kg 70.8 kg    Exam:  . General: 22 y.o. year-old female well developed well nourished in no acute distress.  Alert and oriented x3. . Cardiovascular: Regular rate and rhythm with no rubs or gallops.  No thyromegaly or JVD noted.   Marland Kitchen Respiratory: Clear to auscultation with no wheezes or rales. Good inspiratory effort. . Abdomen: Soft nontender nondistended with normal bowel sounds  x4 quadrants.  Mild tenderness on palpation of right flank. . Musculoskeletal: No lower extremity edema. 2/4 pulses in all 4 extremities. . Skin: No ulcerative lesions noted or rashes, . Psychiatry: Mood is appropriate for condition and setting   Data Reviewed: CBC: Recent Labs  Lab 05/19/18 1607 05/19/18 1834 05/19/18 2252  05/20/18 0552  WBC 20.0*  --   --  16.9*  NEUTROABS 17.7*  --   --   --   HGB 9.7* 9.9* 10.2* 8.8*  HCT 28.3* 29.0* 30.0* 26.5*  MCV 107.2*  --   --  109.1*  PLT 105*  --   --  87*   Basic Metabolic Panel: Recent Labs  Lab 05/19/18 1607 05/19/18 1834 05/19/18 1839 05/19/18 2239 05/19/18 2252 05/19/18 2320 05/20/18 0344 05/20/18 0552  NA 140 139  --   --  137  --  138 138  K <2.0* 2.0* 2.6*  --  3.5  --  2.0* 2.4*  CL 99 96*  --   --  104  --  108 106  CO2 26  --   --   --   --   --  20* 20*  GLUCOSE 92 88  --   --  88  --  97 94  BUN 10 8  --   --  11  --  11 11  CREATININE 1.36* 1.20*  --   --  1.10*  --  1.10* 1.13*  CALCIUM 6.6*  --   --   --   --   --  6.4* 6.5*  MG  --   --  0.7* 1.5*  --   --  2.1  --   PHOS  --   --   --   --   --  1.6*  --   --    GFR: Estimated Creatinine Clearance: 76.6 mL/min (A) (by C-G formula based on SCr of 1.13 mg/dL (H)). Liver Function Tests: Recent Labs  Lab 05/19/18 1607  AST 39  ALT 21  ALKPHOS 61  BILITOT 2.4*  PROT 5.6*  ALBUMIN 2.7*   Recent Labs  Lab 05/19/18 1607  LIPASE 15   No results for input(s): AMMONIA in the last 168 hours. Coagulation Profile: Recent Labs  Lab 05/19/18 2320  INR 1.10   Cardiac Enzymes: No results for input(s): CKTOTAL, CKMB, CKMBINDEX, TROPONINI in the last 168 hours. BNP (last 3 results) No results for input(s): PROBNP in the last 8760 hours. HbA1C: No results for input(s): HGBA1C in the last 72 hours. CBG: Recent Labs  Lab 05/19/18 1521  GLUCAP 98   Lipid Profile: No results for input(s): CHOL, HDL, LDLCALC, TRIG, CHOLHDL, LDLDIRECT in the last 72 hours. Thyroid Function Tests: No results for input(s): TSH, T4TOTAL, FREET4, T3FREE, THYROIDAB in the last 72 hours. Anemia Panel: Recent Labs    05/19/18 2320 05/19/18 2321  VITAMINB12  --  686  FOLATE  --  2.7*  FERRITIN  --  345*  TIBC  --  127*  IRON  --  8*  RETICCTPCT 2.5  --    Urine analysis:    Component Value  Date/Time   COLORURINE AMBER (A) 05/19/2018 1516   APPEARANCEUR CLOUDY (A) 05/19/2018 1516   LABSPEC 1.026 05/19/2018 1516   PHURINE 5.0 05/19/2018 1516   GLUCOSEU NEGATIVE 05/19/2018 1516   HGBUR SMALL (A) 05/19/2018 1516   BILIRUBINUR MODERATE (A) 05/19/2018 1516   KETONESUR NEGATIVE 05/19/2018 1516   PROTEINUR >=300 (A) 05/19/2018 1516  NITRITE NEGATIVE 05/19/2018 1516   LEUKOCYTESUR MODERATE (A) 05/19/2018 1516   Sepsis Labs: @LABRCNTIP (procalcitonin:4,lacticidven:4)  ) Recent Results (from the past 240 hour(s))  Culture, blood (routine x 2)     Status: None (Preliminary result)   Collection Time: 05/19/18  4:07 PM  Result Value Ref Range Status   Specimen Description   Final    BLOOD LEFT ANTECUBITAL Performed at [facility], [address] [place], [place] [address]    Special Requests   Final    BOTTLES DRAWN AEROBIC AND ANAEROBIC Blood Culture results may not be optimal due to an excessive volume of blood received in culture bottles Performed at [facility], [address], [place], [place] [address]    Culture  Setup Time   Final    GRAM NEGATIVE RODS IN BOTH AEROBIC AND ANAEROBIC BOTTLES Organism ID to follow CRITICAL RESULT CALLED TO, READ BACK BY AND VERIFIED WITH: Erling Cruz PharmD 11:35 05/20/18 ([email or web address])    Culture GRAM NEGATIVE RODS  Final   Report Status PENDING  Incomplete  Blood Culture ID Panel (Reflexed)     Status: Abnormal   Collection Time: 05/19/18  4:07 PM  Result Value Ref Range Status   Enterococcus species NOT DETECTED NOT DETECTED Final   Listeria monocytogenes NOT DETECTED NOT DETECTED Final   Staphylococcus species NOT DETECTED NOT DETECTED Final   Staphylococcus aureus (BCID) NOT DETECTED NOT DETECTED Final   Streptococcus species NOT DETECTED NOT DETECTED Final   Streptococcus agalactiae NOT DETECTED NOT DETECTED Final   Streptococcus pneumoniae NOT DETECTED NOT DETECTED Final   Streptococcus  pyogenes NOT DETECTED NOT DETECTED Final   Acinetobacter baumannii NOT DETECTED NOT DETECTED Final   Enterobacteriaceae species DETECTED (A) NOT DETECTED Final    Comment: Enterobacteriaceae represent a large family of gram-negative bacteria, not a single organism. CRITICAL RESULT CALLED TO, READ BACK BY AND VERIFIED WITH: Erling Cruz PharmD 11:35 05/20/18 ([email or web address])    Enterobacter cloacae complex NOT DETECTED NOT DETECTED Final   Escherichia coli DETECTED (A) NOT DETECTED Final    Comment: CRITICAL RESULT CALLED TO, READ BACK BY AND VERIFIED WITH: Erling Cruz PharmD 11:35 05/20/18 ([email or web address])    Klebsiella oxytoca NOT DETECTED NOT DETECTED Final   Klebsiella pneumoniae NOT DETECTED NOT DETECTED Final   Proteus species NOT DETECTED NOT DETECTED Final   Serratia marcescens NOT DETECTED NOT DETECTED Final   Carbapenem resistance NOT DETECTED NOT DETECTED Final   Haemophilus influenzae NOT DETECTED NOT DETECTED Final   Neisseria meningitidis NOT DETECTED NOT DETECTED Final   Pseudomonas aeruginosa NOT DETECTED NOT DETECTED Final   Candida albicans NOT DETECTED NOT DETECTED Final   Candida glabrata NOT DETECTED NOT DETECTED Final   Candida krusei NOT DETECTED NOT DETECTED Final   Candida parapsilosis NOT DETECTED NOT DETECTED Final   Candida tropicalis NOT DETECTED NOT DETECTED Final      Studies: Ct Abdomen Pelvis W Contrast  Result Date: 05/19/2018 CLINICAL DATA:  Right lower quadrant pain with nausea and vomiting x3 days. EXAM: CT ABDOMEN AND PELVIS WITH CONTRAST TECHNIQUE: Multidetector CT imaging of the abdomen and pelvis was performed using the standard protocol following bolus administration of intravenous contrast. CONTRAST:  ISOVUE-300 IOPAMIDOL (ISOVUE-300) INJECTION 61% COMPARISON:  None. FINDINGS: Lower chest: No acute abnormality. Hepatobiliary: Steatosis of the liver. More focal fatty infiltration about the falciform ligament is also identified. The gallbladder is  physiologically distended without stones. Pancreas: Normal Spleen: Normal Adrenals/Urinary Tract: Abnormal striated enhancement pattern of  the right kidney with mild to moderate nephromegaly and perinephric fat stranding. Findings suggest the presence of pyelonephritis. No lobar nephronia. No hydroureteronephrosis. The urinary bladder is decompressed. Stomach/Bowel: Stomach is within normal limits. Appendix appears normal. No evidence of bowel wall thickening, distention, or inflammatory changes. Vascular/Lymphatic: No significant vascular findings are present. No enlarged abdominal or pelvic lymph nodes. Reproductive: Uterus and bilateral adnexa are unremarkable. Other: Trace edema and fluid along the right paracolic gutter. Musculoskeletal: No acute or significant osseous findings. IMPRESSION: Nephromegaly with striated enhancement pattern and perinephric edema about the right kidney consistent with right-sided pyelonephritis. Electronically Signed   By: Tollie Eth M.D.   On: 05/19/2018 18:43    Scheduled Meds: . folic acid  1 mg Oral Daily  . LORazepam  0-4 mg Intravenous Q6H   Followed by  . [START ON 05/21/2018] LORazepam  0-4 mg Intravenous Q12H  . multivitamin with minerals  1 tablet Oral Daily  . norethindrone-ethinyl estradiol  1 tablet Oral QHS  . potassium chloride  40 mEq Oral TID  . thiamine  100 mg Oral Daily   Or  . thiamine  100 mg Intravenous Daily  . traMADol  50 mg Oral Q12H    Continuous Infusions: . sodium chloride 75 mL/hr at 05/20/18 0734  . cefTRIAXone (ROCEPHIN)  IV Stopped (05/19/18 1734)     LOS: 0 days     Darlin Drop, MD [facility] Pager [phone]  If 7PM-7AM, please contact night-coverage [email or web address] Password [email or web address] 05/20/2018, 12:22 PM

## 2018-05-20 NOTE — Progress Notes (Signed)
Notified on call provider of HR 130s, temp 99.61F, VS ordered Q2

## 2018-05-20 NOTE — Progress Notes (Signed)
DR. Clyde Lundborg notified of critical lactic acid 2.8 up from 2.1 and potassium of 2.4 up from 2.0.

## 2018-05-20 NOTE — ED Notes (Signed)
Sat with patient to help her focus and concentrate on her breathing. Pt was extremely anxious. With patient I spoke to pts grandmother on Speakerphone, grandmother stressed the importance of taking medications to slow her heart rate.  pt finally agreed to take heart medications if I stayed with er " so she didn't die".  I agreed to take patient up to the floor and sat with her until 0200 when patient was asleep.

## 2018-05-21 ENCOUNTER — Inpatient Hospital Stay (HOSPITAL_COMMUNITY): Payer: Managed Care, Other (non HMO)

## 2018-05-21 DIAGNOSIS — E876 Hypokalemia: Secondary | ICD-10-CM

## 2018-05-21 DIAGNOSIS — D539 Nutritional anemia, unspecified: Secondary | ICD-10-CM

## 2018-05-21 DIAGNOSIS — N12 Tubulo-interstitial nephritis, not specified as acute or chronic: Secondary | ICD-10-CM

## 2018-05-21 DIAGNOSIS — E878 Other disorders of electrolyte and fluid balance, not elsewhere classified: Secondary | ICD-10-CM

## 2018-05-21 DIAGNOSIS — A419 Sepsis, unspecified organism: Secondary | ICD-10-CM

## 2018-05-21 DIAGNOSIS — N390 Urinary tract infection, site not specified: Secondary | ICD-10-CM

## 2018-05-21 DIAGNOSIS — N10 Acute tubulo-interstitial nephritis: Secondary | ICD-10-CM

## 2018-05-21 DIAGNOSIS — D696 Thrombocytopenia, unspecified: Secondary | ICD-10-CM

## 2018-05-21 DIAGNOSIS — R7989 Other specified abnormal findings of blood chemistry: Secondary | ICD-10-CM

## 2018-05-21 LAB — CBC
HCT: 34 % — ABNORMAL LOW (ref 36.0–46.0)
HEMOGLOBIN: 11.8 g/dL — AB (ref 12.0–15.0)
MCH: 36.4 pg — ABNORMAL HIGH (ref 26.0–34.0)
MCHC: 34.7 g/dL (ref 30.0–36.0)
MCV: 104.9 fL — ABNORMAL HIGH (ref 80.0–100.0)
NRBC: 0.8 % — AB (ref 0.0–0.2)
PLATELETS: 64 10*3/uL — AB (ref 150–400)
RBC: 3.24 MIL/uL — AB (ref 3.87–5.11)
RDW: 16.2 % — ABNORMAL HIGH (ref 11.5–15.5)
WBC: 11.2 10*3/uL — ABNORMAL HIGH (ref 4.0–10.5)

## 2018-05-21 LAB — BASIC METABOLIC PANEL
ANION GAP: 10 (ref 5–15)
BUN: 7 mg/dL (ref 6–20)
CHLORIDE: 109 mmol/L (ref 98–111)
CO2: 18 mmol/L — ABNORMAL LOW (ref 22–32)
Calcium: 7.3 mg/dL — ABNORMAL LOW (ref 8.9–10.3)
Creatinine, Ser: 0.79 mg/dL (ref 0.44–1.00)
GFR calc Af Amer: >60 mL/min (ref >60)
GLUCOSE: 78 mg/dL (ref 70–99)
POTASSIUM: 3.3 mmol/L — AB (ref 3.5–5.1)
Sodium: 137 mmol/L (ref 135–145)

## 2018-05-21 MED ORDER — IBUPROFEN 200 MG PO TABS
600.0000 mg | ORAL_TABLET | Freq: Four times a day (QID) | ORAL | Status: DC | PRN
  Administered 2018-05-21 – 2018-05-23 (×2): 600 mg via ORAL
  Filled 2018-05-21 (×2): qty 3

## 2018-05-21 MED ORDER — OXYCODONE HCL 5 MG PO TABS
5.0000 mg | ORAL_TABLET | ORAL | Status: DC | PRN
  Administered 2018-05-22 – 2018-05-23 (×4): 5 mg via ORAL
  Filled 2018-05-21 (×4): qty 1

## 2018-05-21 MED ORDER — POTASSIUM CHLORIDE CRYS ER 20 MEQ PO TBCR
40.0000 meq | EXTENDED_RELEASE_TABLET | Freq: Once | ORAL | Status: AC
  Administered 2018-05-21: 40 meq via ORAL
  Filled 2018-05-21: qty 2

## 2018-05-21 MED ORDER — SODIUM CHLORIDE 0.9 % IV SOLN
1.0000 g | Freq: Three times a day (TID) | INTRAVENOUS | Status: DC
  Administered 2018-05-21 – 2018-05-22 (×3): 1 g via INTRAVENOUS
  Filled 2018-05-21 (×4): qty 1

## 2018-05-21 NOTE — Progress Notes (Signed)
Dr. Sharl Ma made aware of MEWS score of 5. Natasha Pope

## 2018-05-21 NOTE — Progress Notes (Signed)
[facility]  PROGRESS NOTE  Natasha Pope ZOX:[id number] DOB: [birth year] DOA: 05/19/2018 PCP: Orland Mustard, MD   Brief HPI:   22 year old female with a history of asthma, tobacco use, alcohol use came to the hospital with right flank pain, fever chills, dysuria.  CT abdomen and pelvis with contrast revealed right pyonephritis.  Patient started on IV ceftriaxone.    Subjective   Patient seen and examined, complains of headache this morning.   Assessment/Plan:     1. Sepsis secondary to acute pyelonephritis and E. coli bacteremia-patient presented with tachycardia, temp 101, tachypnea with respiration greater than 20.  Initially started on IV Rocephin.  Patient continues to spike fever, will switch to IV meropenem.  Urine and blood culture growing E. coli, sensitivities pending.  We will follow the results.  2. E. coli bacteremia-continue IV meropenem as above, follow blood culture results.  3. Hypokalemia-potassium was replaced yesterday, today potassium is 3.3.  Will give another dose of K-Dur 40 mg p.o. x1.  Follow BMP in am.  4. Iron deficiency anemia/macrocytic anemia-folate 2.7, iron saturation 6%, continue iron and folate supplementation.  5. Thrombocytopenia-likely from sepsis, platelet count on 01/31/2018 was 215.  Continue to monitor platelet count in the hospital.  Today platelets are 64.  6. Alcohol use disorder-continue CIWA protocol  7. Tobacco use-continue nicotine patch.     CBG: Recent Labs  Lab 05/19/18 1521  GLUCAP 98    CBC: Recent Labs  Lab 05/19/18 1607 05/19/18 1834 05/19/18 2252 05/20/18 0552 05/21/18 0548  WBC 20.0*  --   --  16.9* 11.2*  NEUTROABS 17.7*  --   --   --   --   HGB 9.7* 9.9* 10.2* 8.8* 11.8*  HCT 28.3* 29.0* 30.0* 26.5* 34.0*  MCV 107.2*  --   --  109.1* 104.9*  PLT 105*  --   --  87* 64*    Basic Metabolic Panel: Recent Labs  Lab 05/19/18 1607 05/19/18 1834 05/19/18 1839 05/19/18 2239 05/19/18 2252  05/19/18 2320 05/20/18 0344 05/20/18 0552 05/20/18 1348 05/21/18 0729  NA 140 139  --   --  137  --  138 138  --  137  K <2.0* 2.0* 2.6*  --  3.5  --  2.0* 2.4* 2.9* 3.3*  CL 99 96*  --   --  104  --  108 106  --  109  CO2 26  --   --   --   --   --  20* 20*  --  18*  GLUCOSE 92 88  --   --  88  --  97 94  --  78  BUN 10 8  --   --  11  --  11 11  --  7  CREATININE 1.36* 1.20*  --   --  1.10*  --  1.10* 1.13*  --  0.79  CALCIUM 6.6*  --   --   --   --   --  6.4* 6.5*  --  7.3*  MG  --   --  0.7* 1.5*  --   --  2.1  --   --   --   PHOS  --   --   --   --   --  1.6*  --   --   --   --      DVT prophylaxis: SCDs  Code Status: Full code  Family Communication: Discussed with mother and grandmother at bedside  Disposition Plan:  likely home when medically ready for discharge   Consultants:  None  Procedures:  None   Antibiotics:   Anti-infectives (From admission, onward)   Start     Dose/Rate Route Frequency Ordered Stop   05/21/18 1400  meropenem (MERREM) 1 g in sodium chloride 0.9 % 100 mL IVPB     1 g 200 mL/hr over 30 Minutes Intravenous Every 8 hours 05/21/18 1348     05/19/18 1615  cefTRIAXone (ROCEPHIN) 2 g in sodium chloride 0.9 % 100 mL IVPB  Status:  Discontinued     2 g 200 mL/hr over 30 Minutes Intravenous Every 24 hours 05/19/18 1606 05/21/18 1322   05/19/18 1615  metroNIDAZOLE (FLAGYL) IVPB 500 mg  Status:  Discontinued     500 mg 100 mL/hr over 60 Minutes Intravenous Every 8 hours 05/19/18 1606 05/19/18 1850       Objective   Vitals:   05/20/18 2006 05/21/18 0152 05/21/18 0600 05/21/18 1314  BP: (!) 146/101 (!) 128/92 (!) 136/102 130/89  Pulse: (!) 130 (!) 131 (!) 136 (!) 153  Resp: 16 16 16 20   Temp: 99.8 F (37.7 C) 98.3 F (36.8 C) 98.3 F (36.8 C) (!) 101.9 F (38.8 C)  TempSrc: Oral Oral Oral Oral  SpO2: 98% 96% 96% 96%  Weight:      Height:        Intake/Output Summary (Last 24 hours) at 05/21/2018 1501 Last data filed at  05/21/2018 0200 Gross per 24 hour  Intake 1780 ml  Output -  Net 1780 ml   Filed Weights   05/19/18 1521 05/19/18 2343  Weight: 68 kg 70.8 kg     Physical Examination:    General: Appears in no acute distress  Cardiovascular: S1-S2, regular, no murmurs auscultated  Respiratory: Clear to auscultation bilaterally  Abdomen: Soft, tenderness in right lower quadrant, no organomegaly  Extremities: No edema of the lower extremities  Neurologic: Alert, oriented x3     Data Reviewed: I have personally reviewed following labs and imaging studies   Recent Results (from the past 240 hour(s))  Urine culture     Status: Abnormal (Preliminary result)   Collection Time: 05/19/18  3:16 PM  Result Value Ref Range Status   Specimen Description   Final    URINE, CLEAN CATCH Performed at [facility], [address], [place], [place] [address]    Special Requests   Final    NONE Performed at [facility], [address], [place], [place] [address]    Culture >=100,000 COLONIES/mL ESCHERICHIA COLI (A)  Final   Report Status PENDING  Incomplete  Culture, blood (routine x 2)     Status: Abnormal (Preliminary result)   Collection Time: 05/19/18  4:07 PM  Result Value Ref Range Status   Specimen Description   Final    BLOOD LEFT ANTECUBITAL Performed at [facility], [address], [place], [place] [address]    Special Requests   Final    BOTTLES DRAWN AEROBIC AND ANAEROBIC Blood Culture results may not be optimal due to an excessive volume of blood received in culture bottles Performed at [facility], [address], [place], [place] [address]    Culture  Setup Time   Final    GRAM NEGATIVE RODS IN BOTH AEROBIC AND ANAEROBIC BOTTLES CRITICAL RESULT CALLED TO, READ BACK BY AND VERIFIED WITH: Erling Cruz PharmD 11:35 05/20/18 ([email or web address])    Culture (A)  Final  ESCHERICHIA COLI SUSCEPTIBILITIES TO  FOLLOW Performed at [facility], [address], [place], [place] [address]    Report Status PENDING  Incomplete  Blood Culture ID Panel (Reflexed)     Status: Abnormal   Collection Time: 05/19/18  4:07 PM  Result Value Ref Range Status   Enterococcus species NOT DETECTED NOT DETECTED Final   Listeria monocytogenes NOT DETECTED NOT DETECTED Final   Staphylococcus species NOT DETECTED NOT DETECTED Final   Staphylococcus aureus (BCID) NOT DETECTED NOT DETECTED Final   Streptococcus species NOT DETECTED NOT DETECTED Final   Streptococcus agalactiae NOT DETECTED NOT DETECTED Final   Streptococcus pneumoniae NOT DETECTED NOT DETECTED Final   Streptococcus pyogenes NOT DETECTED NOT DETECTED Final   Acinetobacter baumannii NOT DETECTED NOT DETECTED Final   Enterobacteriaceae species DETECTED (A) NOT DETECTED Final    Comment: Enterobacteriaceae represent a large family of gram-negative bacteria, not a single organism. CRITICAL RESULT CALLED TO, READ BACK BY AND VERIFIED WITH: Erling Cruz PharmD 11:35 05/20/18 ([email or web address])    Enterobacter cloacae complex NOT DETECTED NOT DETECTED Final   Escherichia coli DETECTED (A) NOT DETECTED Final    Comment: CRITICAL RESULT CALLED TO, READ BACK BY AND VERIFIED WITH: Erling Cruz PharmD 11:35 05/20/18 ([email or web address])    Klebsiella oxytoca NOT DETECTED NOT DETECTED Final   Klebsiella pneumoniae NOT DETECTED NOT DETECTED Final   Proteus species NOT DETECTED NOT DETECTED Final   Serratia marcescens NOT DETECTED NOT DETECTED Final   Carbapenem resistance NOT DETECTED NOT DETECTED Final   Haemophilus influenzae NOT DETECTED NOT DETECTED Final   Neisseria meningitidis NOT DETECTED NOT DETECTED Final   Pseudomonas aeruginosa NOT DETECTED NOT DETECTED Final   Candida albicans NOT DETECTED NOT DETECTED Final   Candida glabrata NOT DETECTED NOT DETECTED Final   Candida krusei NOT DETECTED NOT DETECTED Final   Candida parapsilosis NOT DETECTED NOT DETECTED Final    Candida tropicalis NOT DETECTED NOT DETECTED Final  Culture, blood (routine x 2)     Status: None (Preliminary result)   Collection Time: 05/20/18 12:36 AM  Result Value Ref Range Status   Specimen Description   Final    BLOOD RIGHT ANTECUBITAL Performed at [facility], [address], [place], [place] [address]    Special Requests   Final    BOTTLES DRAWN AEROBIC AND ANAEROBIC Blood Culture adequate volume Performed at [facility], [address], [place], [place] [address]    Culture   Final    NO GROWTH 1 DAY Performed at [facility], [address] [place], [place] [address]    Report Status PENDING  Incomplete     Liver Function Tests: Recent Labs  Lab 05/19/18 1607  AST 39  ALT 21  ALKPHOS 61  BILITOT 2.4*  PROT 5.6*  ALBUMIN 2.7*   Recent Labs  Lab 05/19/18 1607  LIPASE 15   No results for input(s): AMMONIA in the last 168 hours.  Cardiac Enzymes: No results for input(s): CKTOTAL, CKMB, CKMBINDEX, TROPONINI in the last 168 hours. BNP (last 3 results) No results for input(s): BNP in the last 8760 hours.  ProBNP (last 3 results) No results for input(s): PROBNP in the last 8760 hours.    Studies: Ct Angio Chest Pe W Or Wo Contrast  Result Date: 05/20/2018 CLINICAL DATA:  Elevated D-dimer EXAM: CT ANGIOGRAPHY CHEST WITH CONTRAST TECHNIQUE: Multidetector CT imaging of the chest was performed using the standard protocol during bolus administration of intravenous  contrast. Multiplanar CT image reconstructions and MIPs were obtained to evaluate the vascular anatomy. CONTRAST:  ISOVUE-370 IOPAMIDOL (ISOVUE-370) INJECTION 76% COMPARISON:  None. FINDINGS: Cardiovascular: --Pulmonary arteries: Contrast injection is sufficient to demonstrate satisfactory opacification of the pulmonary arteries to the segmental level. There is no pulmonary embolus. The main pulmonary artery is within normal limits for size. --Aorta:  Limited opacification of the aorta due to bolus timing optimization for the pulmonary arteries. Conventional 3 vessel aortic branching pattern. The aortic course and caliber are normal. There is no aortic atherosclerosis. --Heart: Normal size. No pericardial effusion. Mediastinum/Nodes: No mediastinal, hilar or axillary lymphadenopathy. The visualized thyroid and thoracic esophageal course are unremarkable. Lungs/Pleura: Small pleural effusions with mild associated atelectasis. No nodules or masses. No pneumothorax. Upper Abdomen: Contrast bolus timing is not optimized for evaluation of the abdominal organs. Hepatic steatosis. Musculoskeletal: No chest wall abnormality. No acute or significant osseous findings. Review of the MIP images confirms the above findings. IMPRESSION: 1. No pulmonary embolus. 2. Small pleural effusions with associated atelectasis. 3. Hepatic steatosis. Electronically Signed   By: Deatra Robinson M.D.   On: 05/20/2018 22:28   Ct Abdomen Pelvis W Contrast  Result Date: 05/19/2018 CLINICAL DATA:  Right lower quadrant pain with nausea and vomiting x3 days. EXAM: CT ABDOMEN AND PELVIS WITH CONTRAST TECHNIQUE: Multidetector CT imaging of the abdomen and pelvis was performed using the standard protocol following bolus administration of intravenous contrast. CONTRAST:  ISOVUE-300 IOPAMIDOL (ISOVUE-300) INJECTION 61% COMPARISON:  None. FINDINGS: Lower chest: No acute abnormality. Hepatobiliary: Steatosis of the liver. More focal fatty infiltration about the falciform ligament is also identified. The gallbladder is physiologically distended without stones. Pancreas: Normal Spleen: Normal Adrenals/Urinary Tract: Abnormal striated enhancement pattern of the right kidney with mild to moderate nephromegaly and perinephric fat stranding. Findings suggest the presence of pyelonephritis. No lobar nephronia. No hydroureteronephrosis. The urinary bladder is decompressed. Stomach/Bowel: Stomach is  within normal limits. Appendix appears normal. No evidence of bowel wall thickening, distention, or inflammatory changes. Vascular/Lymphatic: No significant vascular findings are present. No enlarged abdominal or pelvic lymph nodes. Reproductive: Uterus and bilateral adnexa are unremarkable. Other: Trace edema and fluid along the right paracolic gutter. Musculoskeletal: No acute or significant osseous findings. IMPRESSION: Nephromegaly with striated enhancement pattern and perinephric edema about the right kidney consistent with right-sided pyelonephritis. Electronically Signed   By: Tollie Eth M.D.   On: 05/19/2018 18:43    Scheduled Meds: . ferrous sulfate  325 mg Oral Q breakfast  . folic acid  1 mg Oral Daily  . LORazepam  0-4 mg Intravenous Q6H   Followed by  . LORazepam  0-4 mg Intravenous Q12H  . multivitamin with minerals  1 tablet Oral Daily  . norethindrone-ethinyl estradiol  1 tablet Oral QHS  . thiamine  100 mg Oral Daily   Or  . thiamine  100 mg Intravenous Daily    Admission status: Inpatient: Based on patients clinical presentation and evaluation of above clinical data, I have made determination that patient meets Inpatient criteria at this time.  Patient requiring IV antibiotics, blood cultures growing E. coli, urine culture growing E. coli sensitivities is currently pending.  Patient continues to be tachycardic and spiking fever.  Time spent: 20 min  Meredeth Ide   [facility] Pager [phone]. If 7PM-7AM, please contact night-coverage at [email or web address], Office  [phone]  password [email or web address]  05/21/2018, 3:01 PM  LOS: 1 day

## 2018-05-21 NOTE — Progress Notes (Signed)
Preliminary notes--Bilateral lower extremities venous duplex exam completed. Negative for DVT.   Incidental finding:  2.74x4.64x1.03cm complex cystic structure with internal echoes at right popliteal fossa medially, with non-vascularity.  Eliza Grissinger H Sabella Traore(RDMS RVT) 05/21/18 10:16 AM  .

## 2018-05-21 NOTE — Progress Notes (Signed)
Pharmacy Antibiotic Note  Natasha Pope is a 22 y.o. female presented to the ED on 05/19/2018 with c/o abd pain and n/v.  Abdominal CT on 10/28 showed findings consistent with right sided pyelonephritis. Ceftriaxone started on admission for infection. Bcx and ucx now with Ecoli -- sensitivities pending.  Patient remains febrile- to change abx to meropenem on 10/30.  Today, 05/21/2018: - Tmax 101.9, wbc down 11.2 -  scr 0.79 (crcl~100)   Plan: - meropenem 1gm IV q8h - f/u with susceptibilities for Ecoli  ________________________________  Height: 5\' 7"  (170.2 cm) Weight: 156 lb 1.6 oz (70.8 kg) IBW/kg (Calculated) : 61.6  Temp (24hrs), Avg:99.5 F (37.5 C), Min:98.3 F (36.8 C), Max:101.9 F (38.8 C)  Recent Labs  Lab 05/19/18 1607  05/19/18 1834 05/19/18 2252 05/20/18 0036 05/20/18 0344 05/20/18 0552 05/20/18 0821 05/21/18 0548 05/21/18 0729  WBC 20.0*  --   --   --   --   --  16.9*  --  11.2*  --   CREATININE 1.36*  --  1.20* 1.10*  --  1.10* 1.13*  --   --  0.79  LATICACIDVEN  --    < > 2.29*  --  5.8* 2.1* 2.8* 1.9  --   --    < > = values in this interval not displayed.    Estimated Creatinine Clearance: 108.2 mL/min (by C-G formula based on SCr of 0.79 mg/dL).    No Known Allergies  Antimicrobials this admission:  10/28 CTX>>10/30 10/30 merrem>>  Dose adjustments this admission:  --  Microbiology results:  10/28 Urine: >100K Ecoli 10/28 Blood: E.coli 10/29 bcx:  Thank you for allowing pharmacy to be a part of this patient's care.  Lucia Gaskins 05/21/2018 1:35 PM

## 2018-05-22 ENCOUNTER — Encounter (HOSPITAL_COMMUNITY): Payer: Self-pay

## 2018-05-22 DIAGNOSIS — N179 Acute kidney failure, unspecified: Secondary | ICD-10-CM

## 2018-05-22 DIAGNOSIS — Z7289 Other problems related to lifestyle: Secondary | ICD-10-CM

## 2018-05-22 LAB — COMPREHENSIVE METABOLIC PANEL
ALT: 21 U/L (ref 0–44)
AST: 49 U/L — ABNORMAL HIGH (ref 15–41)
Albumin: 2.5 g/dL — ABNORMAL LOW (ref 3.5–5.0)
Alkaline Phosphatase: 105 U/L (ref 38–126)
Anion gap: 9 (ref 5–15)
BUN: 7 mg/dL (ref 6–20)
CO2: 18 mmol/L — ABNORMAL LOW (ref 22–32)
CREATININE: 0.91 mg/dL (ref 0.44–1.00)
Calcium: 7.8 mg/dL — ABNORMAL LOW (ref 8.9–10.3)
Chloride: 109 mmol/L (ref 98–111)
GFR calc Af Amer: >60 mL/min (ref >60)
GLUCOSE: 106 mg/dL — AB (ref 70–99)
Potassium: 3.2 mmol/L — ABNORMAL LOW (ref 3.5–5.1)
Sodium: 136 mmol/L (ref 135–145)
Total Bilirubin: 1.8 mg/dL — ABNORMAL HIGH (ref 0.3–1.2)
Total Protein: 5.4 g/dL — ABNORMAL LOW (ref 6.5–8.1)

## 2018-05-22 LAB — CULTURE, BLOOD (ROUTINE X 2)

## 2018-05-22 LAB — URINE CULTURE

## 2018-05-22 LAB — CBC
HCT: 27 % — ABNORMAL LOW (ref 36.0–46.0)
Hemoglobin: 9.3 g/dL — ABNORMAL LOW (ref 12.0–15.0)
MCH: 36.5 pg — AB (ref 26.0–34.0)
MCHC: 34.4 g/dL (ref 30.0–36.0)
MCV: 105.9 fL — ABNORMAL HIGH (ref 80.0–100.0)
PLATELETS: 101 10*3/uL — AB (ref 150–400)
RBC: 2.55 MIL/uL — ABNORMAL LOW (ref 3.87–5.11)
RDW: 16.7 % — AB (ref 11.5–15.5)
WBC: 10.1 10*3/uL (ref 4.0–10.5)
nRBC: 1.1 % — ABNORMAL HIGH (ref 0.0–0.2)

## 2018-05-22 LAB — MAGNESIUM: MAGNESIUM: 1.9 mg/dL (ref 1.7–2.4)

## 2018-05-22 MED ORDER — SODIUM CHLORIDE 0.9 % IV SOLN
1.0000 g | Freq: Four times a day (QID) | INTRAVENOUS | Status: DC
  Filled 2018-05-22: qty 1000

## 2018-05-22 MED ORDER — POTASSIUM CHLORIDE 10 MEQ/100ML IV SOLN
10.0000 meq | INTRAVENOUS | Status: AC
  Administered 2018-05-22 (×3): 10 meq via INTRAVENOUS
  Filled 2018-05-22 (×3): qty 100

## 2018-05-22 MED ORDER — SODIUM CHLORIDE 0.9 % IV SOLN
2.0000 g | Freq: Four times a day (QID) | INTRAVENOUS | Status: DC
  Administered 2018-05-22 – 2018-05-23 (×4): 2 g via INTRAVENOUS
  Filled 2018-05-22 (×2): qty 2
  Filled 2018-05-22: qty 2000
  Filled 2018-05-22 (×2): qty 2

## 2018-05-22 NOTE — Progress Notes (Signed)
Pt took telemetry leads and box off, refuses further cardiac monitoring at this time. Pt expressing wishes to leave hospital tonight against medical advice, pt is discussing with family at bedside at this time before making decision. Will continue to monitor.

## 2018-05-22 NOTE — Clinical Social Work Note (Signed)
Clinical Social Work Assessment  Patient Details  Name: Natasha Pope MRN: [id number] Date of Birth: 1995-11-17  Date of referral:  05/22/18               Reason for consult:  Substance Use/ETOH Abuse                Permission sought to share information with:    Permission granted to share information::     Name::        Agency::     Relationship::     Contact Information:     Housing/Transportation Living arrangements for the past 2 months:  Apartment Source of Information:  Patient Patient Interpreter Needed:  None Criminal Activity/Legal Involvement Pertinent to Current Situation/Hospitalization:  No - Comment as needed Significant Relationships:  Other Family Members(Grandmother) Lives with:  Self Do you feel safe going back to the place where you live?  Yes Need for family participation in patient care:  No (Coment)  Care giving concerns:  CSW consulted for possible substance use resources.    Social Worker assessment / plan:  CSW spoke with patient and patient's grandmother at bedside. Per patient, she is here for a possible kidney infection and came in after throwing up multiple times. Patient reports she is feeling much better. Per patient, she lives in [place] in an apartment by herself. Patient stated that she was living in [place] with her dad and has, since May, moved back and forth between [place] and [place]. Patient reported that she moved to [facility] to live with a cousin but that it did not work out well and so her grandmother moved her to [place]. Patient reports she has recently gotten a job at [employer]. Patient reports her grandmother as her only current support. Per patient, grandmother lives in [place] and comes to visit when she can. Patient reported that she does not use substances. Per patient, she quit drinking a week and a half ago. Patient states she was drinking a couple of beers a few nights a week but did not like the way  it made her feel. Patient denies any withdrawal symptoms and denies needing any substance use resources. CSW spoke to patient about possible mental health resources to which patient also denied. Patient states "she's good" mentally.   Employment status:  Hydrologist PT Recommendations:    Information / Referral to community resources:     Patient/Family's Response to care:  Patient was appropriate throughout assessment and was open with CSW. Patient appreciative of CSW's efforts despite not being interested in resources.  Patient/Family's Understanding of and Emotional Response to Diagnosis, Current Treatment, and Prognosis:  Patient denied needing any resources but understood if she thought of anything she could contact CSW again. Patient has pending psych consult. Patient to discharge back home once medically and psychiatrically stable for discharge.  Emotional Assessment Appearance:  Appears stated age Attitude/Demeanor/Rapport:    Affect (typically observed):  Flat, Calm, Pleasant, Accepting Orientation:  Oriented to Self, Oriented to Situation, Oriented to Place, Oriented to  Time Alcohol / Substance use:  Tobacco Use, Alcohol Use Psych involvement (Current and /or in the community):  Yes (Comment)(Psych consult pending)  Discharge Needs  Concerns to be addressed:  Denies Needs/Concerns at this time, No discharge needs identified Readmission within the last 30 days:  Yes Current discharge risk:  None Barriers to Discharge:  No Barriers Identified   Archie Balboa, LCSWA 05/22/2018, 2:51 PM

## 2018-05-22 NOTE — Progress Notes (Addendum)
[facility]  PROGRESS NOTE  Natasha Pope [place] WJX:[id number] DOB: Dec 19, 1995 DOA: 05/19/2018 PCP: Orland Mustard, MD   Brief HPI:   22 year old female with a history of asthma, tobacco use, alcohol use came to the hospital with right flank pain, fever chills, dysuria.  CT abdomen and pelvis with contrast revealed right pyonephritis.  Patient started on IV ceftriaxone.    Subjective   Patient seen and examined, denies any pain.  Was confused this morning after she received breathing treatment.  Wanted to leave AMA last night,  patient denies being depressed, has seen psychiatrist in the past.   Assessment/Plan:     1. Sepsis secondary to acute pyelonephritis and E. coli bacteremia-patient presented with tachycardia, temp 101, tachypnea with respiration greater than 20.  Initially started on IV Rocephin.  Patient continued to spike fever, was switched to IV meropenem.  Urine and blood culture growing E. coli, which is pansensitive.  Will start IV ampicillin 1 g IV every 6 hours.  2. E. coli bacteremia-continue IV ampicillin 1 g IV every 6 hours.  3. Hypokalemia-potassium was replaced yesterday, potassium is still 3.1, will replace potassium.  We will also check serum magnesium level.  4. Iron deficiency anemia/macrocytic anemia-folate 2.7, iron saturation 6%, continue iron and folate supplementation.  5. History of depression-patient wanted to leave AMA last night, was supposedly confused this morning.  Spoke to patient, will request psychiatric evaluation in the hospital.  6. Thrombocytopenia-likely from sepsis, platelet count on 01/31/2018 was 215.  Continue to monitor platelet count in the hospital.  Platelet count went down to 64,000, slowly improving.  Today 101  7. Alcohol use disorder-continue CIWA protocol  8. Tobacco use-continue nicotine patch.     CBG: Recent Labs  Lab 05/19/18 1521  GLUCAP 98    CBC: Recent Labs  Lab 05/19/18 1607 05/19/18 1834  05/19/18 2252 05/20/18 0552 05/21/18 0548 05/22/18 0538  WBC 20.0*  --   --  16.9* 11.2* 10.1  NEUTROABS 17.7*  --   --   --   --   --   HGB 9.7* 9.9* 10.2* 8.8* 11.8* 9.3*  HCT 28.3* 29.0* 30.0* 26.5* 34.0* 27.0*  MCV 107.2*  --   --  109.1* 104.9* 105.9*  PLT 105*  --   --  87* 64* 101*    Basic Metabolic Panel: Recent Labs  Lab 05/19/18 1607  05/19/18 1839 05/19/18 2239 05/19/18 2252 05/19/18 2320 05/20/18 0344 05/20/18 0552 05/20/18 1348 05/21/18 0729 05/22/18 0538  NA 140   < >  --   --  137  --  138 138  --  137 136  K <2.0*   < > 2.6*  --  3.5  --  2.0* 2.4* 2.9* 3.3* 3.2*  CL 99   < >  --   --  104  --  108 106  --  109 109  CO2 26  --   --   --   --   --  20* 20*  --  18* 18*  GLUCOSE 92   < >  --   --  88  --  97 94  --  78 106*  BUN 10   < >  --   --  11  --  11 11  --  7 7  CREATININE 1.36*   < >  --   --  1.10*  --  1.10* 1.13*  --  0.79 0.91  CALCIUM 6.6*  --   --   --   --   --  6.4* 6.5*  --  7.3* 7.8*  MG  --   --  0.7* 1.5*  --   --  2.1  --   --   --   --   PHOS  --   --   --   --   --  1.6*  --   --   --   --   --    < > = values in this interval not displayed.     DVT prophylaxis: SCDs  Code Status: Full code  Family Communication: Discussed with mother and grandmother at bedside  Disposition Plan: likely home when medically ready for discharge   Consultants:  None  Procedures:  None   Antibiotics:   Anti-infectives (From admission, onward)   Start     Dose/Rate Route Frequency Ordered Stop   05/22/18 1200  ampicillin (OMNIPEN) 1 g in sodium chloride 0.9 % 100 mL IVPB  Status:  Discontinued     1 g 300 mL/hr over 20 Minutes Intravenous Every 6 hours 05/22/18 1116 05/22/18 1129   05/22/18 1200  ampicillin (OMNIPEN) 2 g in sodium chloride 0.9 % 100 mL IVPB     2 g 300 mL/hr over 20 Minutes Intravenous Every 6 hours 05/22/18 1129     05/21/18 1400  meropenem (MERREM) 1 g in sodium chloride 0.9 % 100 mL IVPB  Status:  Discontinued      1 g 200 mL/hr over 30 Minutes Intravenous Every 8 hours 05/21/18 1348 05/22/18 1116   05/19/18 1615  cefTRIAXone (ROCEPHIN) 2 g in sodium chloride 0.9 % 100 mL IVPB  Status:  Discontinued     2 g 200 mL/hr over 30 Minutes Intravenous Every 24 hours 05/19/18 1606 05/21/18 1322   05/19/18 1615  metroNIDAZOLE (FLAGYL) IVPB 500 mg  Status:  Discontinued     500 mg 100 mL/hr over 60 Minutes Intravenous Every 8 hours 05/19/18 1606 05/19/18 1850       Objective   Vitals:   05/21/18 2121 05/22/18 0652 05/22/18 0733 05/22/18 0859  BP: 117/87 (!) 132/99  132/81  Pulse: (!) 126 (!) 129  (!) 127  Resp: 18 19  (!) 25  Temp: 97.9 F (36.6 C) (!) 102.8 F (39.3 C)  99.2 F (37.3 C)  TempSrc: Oral Oral  Oral  SpO2: 98% 94% 98% 97%  Weight:      Height:        Intake/Output Summary (Last 24 hours) at 05/22/2018 1211 Last data filed at 05/22/2018 0200 Gross per 24 hour  Intake 1297.83 ml  Output -  Net 1297.83 ml   Filed Weights   05/19/18 1521 05/19/18 2343  Weight: 68 kg 70.8 kg     Physical Examination:    Neck: Supple, no deformities, masses, or tenderness Lungs: Normal respiratory effort, bilateral clear to auscultation, no crackles or wheezes.  Heart: Regular rate and rhythm, S1 and S2 normal, no murmurs, rubs auscultated Abdomen: BS normoactive,soft,nondistended,non-tender to palpation,no organomegaly Extremities: No pretibial edema, no erythema, no cyanosis, no clubbing Neuro : Alert and oriented to time, place and person, No focal deficits     Data Reviewed: I have personally reviewed following labs and imaging studies   Recent Results (from the past 240 hour(s))  Urine culture     Status: Abnormal   Collection Time: 05/19/18  3:16 PM  Result Value Ref Range Status   Specimen Description   Final    URINE, CLEAN CATCH Performed at [facility],  [address] [place], [place] [address]    Special Requests   Final    NONE Performed at  [facility], [address], [place], [place] [address]    Culture >=100,000 COLONIES/mL ESCHERICHIA COLI (A)  Final   Report Status 05/22/2018 FINAL  Final   Organism ID, Bacteria ESCHERICHIA COLI (A)  Final      Susceptibility   Escherichia coli - MIC*    AMPICILLIN <=2 SENSITIVE Sensitive     CEFAZOLIN <=4 SENSITIVE Sensitive     CEFTRIAXONE <=1 SENSITIVE Sensitive     CIPROFLOXACIN <=0.25 SENSITIVE Sensitive     GENTAMICIN <=1 SENSITIVE Sensitive     IMIPENEM <=0.25 SENSITIVE Sensitive     NITROFURANTOIN <=16 SENSITIVE Sensitive     TRIMETH/SULFA <=20 SENSITIVE Sensitive     AMPICILLIN/SULBACTAM <=2 SENSITIVE Sensitive     PIP/TAZO <=4 SENSITIVE Sensitive     Extended ESBL NEGATIVE Sensitive     * >=100,000 COLONIES/mL ESCHERICHIA COLI  Culture, blood (routine x 2)     Status: Abnormal   Collection Time: 05/19/18  4:07 PM  Result Value Ref Range Status   Specimen Description   Final    BLOOD LEFT ANTECUBITAL Performed at [facility], [address], [place], [place] [address]    Special Requests   Final    BOTTLES DRAWN AEROBIC AND ANAEROBIC Blood Culture results may not be optimal due to an excessive volume of blood received in culture bottles Performed at [facility], [address], [place], [place] [address]    Culture  Setup Time   Final    GRAM NEGATIVE RODS IN BOTH AEROBIC AND ANAEROBIC BOTTLES CRITICAL RESULT CALLED TO, READ BACK BY AND VERIFIED WITH: Erling Cruz PharmD 11:35 05/20/18 ([email or web address]) Performed at [facility], [address], [place], [place] [address]    Culture ESCHERICHIA COLI (A)  Final   Report Status 05/22/2018 FINAL  Final   Organism ID, Bacteria ESCHERICHIA COLI  Final      Susceptibility   Escherichia coli - MIC*    AMPICILLIN <=2 SENSITIVE Sensitive     CEFAZOLIN <=4 SENSITIVE Sensitive     CEFEPIME <=1 SENSITIVE Sensitive     CEFTAZIDIME <=1 SENSITIVE Sensitive      CEFTRIAXONE <=1 SENSITIVE Sensitive     CIPROFLOXACIN <=0.25 SENSITIVE Sensitive     GENTAMICIN <=1 SENSITIVE Sensitive     IMIPENEM <=0.25 SENSITIVE Sensitive     TRIMETH/SULFA <=20 SENSITIVE Sensitive     AMPICILLIN/SULBACTAM <=2 SENSITIVE Sensitive     PIP/TAZO <=4 SENSITIVE Sensitive     Extended ESBL NEGATIVE Sensitive     * ESCHERICHIA COLI  Blood Culture ID Panel (Reflexed)     Status: Abnormal   Collection Time: 05/19/18  4:07 PM  Result Value Ref Range Status   Enterococcus species NOT DETECTED NOT DETECTED Final   Listeria monocytogenes NOT DETECTED NOT DETECTED Final   Staphylococcus species NOT DETECTED NOT DETECTED Final   Staphylococcus aureus (BCID) NOT DETECTED NOT DETECTED Final   Streptococcus species NOT DETECTED NOT DETECTED Final   Streptococcus agalactiae NOT DETECTED NOT DETECTED Final   Streptococcus pneumoniae NOT DETECTED NOT DETECTED Final   Streptococcus pyogenes NOT DETECTED NOT DETECTED Final   Acinetobacter baumannii NOT DETECTED NOT DETECTED Final   Enterobacteriaceae species DETECTED (A) NOT DETECTED Final    Comment: Enterobacteriaceae represent a large family of gram-negative bacteria, not a single organism. CRITICAL RESULT CALLED TO, READ BACK  BY AND VERIFIED WITH: Erling Cruz PharmD 11:35 05/20/18 ([email or web address])    Enterobacter cloacae complex NOT DETECTED NOT DETECTED Final   Escherichia coli DETECTED (A) NOT DETECTED Final    Comment: CRITICAL RESULT CALLED TO, READ BACK BY AND VERIFIED WITH: Erling Cruz PharmD 11:35 05/20/18 ([email or web address])    Klebsiella oxytoca NOT DETECTED NOT DETECTED Final   Klebsiella pneumoniae NOT DETECTED NOT DETECTED Final   Proteus species NOT DETECTED NOT DETECTED Final   Serratia marcescens NOT DETECTED NOT DETECTED Final   Carbapenem resistance NOT DETECTED NOT DETECTED Final   Haemophilus influenzae NOT DETECTED NOT DETECTED Final   Neisseria meningitidis NOT DETECTED NOT DETECTED Final   Pseudomonas aeruginosa NOT  DETECTED NOT DETECTED Final   Candida albicans NOT DETECTED NOT DETECTED Final   Candida glabrata NOT DETECTED NOT DETECTED Final   Candida krusei NOT DETECTED NOT DETECTED Final   Candida parapsilosis NOT DETECTED NOT DETECTED Final   Candida tropicalis NOT DETECTED NOT DETECTED Final  Culture, blood (routine x 2)     Status: None (Preliminary result)   Collection Time: 05/20/18 12:36 AM  Result Value Ref Range Status   Specimen Description   Final    BLOOD RIGHT ANTECUBITAL Performed at [facility], [address] [place], [place] [address]    Special Requests   Final    BOTTLES DRAWN AEROBIC AND ANAEROBIC Blood Culture adequate volume Performed at [facility], [address], [place], [place] [address]    Culture   Final    NO GROWTH 1 DAY Performed at [facility], [address], [place], [place] [address]    Report Status PENDING  Incomplete     Liver Function Tests: Recent Labs  Lab 05/19/18 1607 05/22/18 0538  AST 39 49*  ALT 21 21  ALKPHOS 61 105  BILITOT 2.4* 1.8*  PROT 5.6* 5.4*  ALBUMIN 2.7* 2.5*   Recent Labs  Lab 05/19/18 1607  LIPASE 15   No results for input(s): AMMONIA in the last 168 hours.  Cardiac Enzymes: No results for input(s): CKTOTAL, CKMB, CKMBINDEX, TROPONINI in the last 168 hours. BNP (last 3 results) No results for input(s): BNP in the last 8760 hours.  ProBNP (last 3 results) No results for input(s): PROBNP in the last 8760 hours.    Studies: Ct Angio Chest Pe W Or Wo Contrast  Result Date: 05/20/2018 CLINICAL DATA:  Elevated D-dimer EXAM: CT ANGIOGRAPHY CHEST WITH CONTRAST TECHNIQUE: Multidetector CT imaging of the chest was performed using the standard protocol during bolus administration of intravenous contrast. Multiplanar CT image reconstructions and MIPs were obtained to evaluate the vascular anatomy. CONTRAST:  ISOVUE-370 IOPAMIDOL (ISOVUE-370) INJECTION 76% COMPARISON:   None. FINDINGS: Cardiovascular: --Pulmonary arteries: Contrast injection is sufficient to demonstrate satisfactory opacification of the pulmonary arteries to the segmental level. There is no pulmonary embolus. The main pulmonary artery is within normal limits for size. --Aorta: Limited opacification of the aorta due to bolus timing optimization for the pulmonary arteries. Conventional 3 vessel aortic branching pattern. The aortic course and caliber are normal. There is no aortic atherosclerosis. --Heart: Normal size. No pericardial effusion. Mediastinum/Nodes: No mediastinal, hilar or axillary lymphadenopathy. The visualized thyroid and thoracic esophageal course are unremarkable. Lungs/Pleura: Small pleural effusions with mild associated atelectasis. No nodules or masses. No pneumothorax. Upper Abdomen: Contrast bolus timing is not optimized for evaluation of the abdominal organs. Hepatic steatosis. Musculoskeletal: No chest wall abnormality. No acute or significant osseous findings. Review  of the MIP images confirms the above findings. IMPRESSION: 1. No pulmonary embolus. 2. Small pleural effusions with associated atelectasis. 3. Hepatic steatosis. Electronically Signed   By: Deatra Robinson M.D.   On: 05/20/2018 22:28   Vas [place] Lower Extremity Venous (dvt)  Result Date: 05/21/2018  Lower Venous Study Indications: Elevated D-dimer.  Limitations: Musculoskeletal features. Performing Technologist: Annamaria Helling  Examination Guidelines: A complete evaluation includes B-mode imaging, spectral Doppler, color Doppler, and power Doppler as needed of all accessible portions of each vessel. Bilateral testing is considered an integral part of a complete examination. Limited examinations for reoccurring indications may be performed as noted.  Right Venous Findings: +---------+---------------+---------+-----------+----------+-------+          CompressibilityPhasicitySpontaneityPropertiesSummary  +---------+---------------+---------+-----------+----------+-------+ CFV      Full           Yes      Yes                          +---------+---------------+---------+-----------+----------+-------+ SFJ      Full                                                 +---------+---------------+---------+-----------+----------+-------+ FV Prox  Full                                                 +---------+---------------+---------+-----------+----------+-------+ FV Mid   Full                                                 +---------+---------------+---------+-----------+----------+-------+ FV DistalFull                                                 +---------+---------------+---------+-----------+----------+-------+ PFV      Full                                                 +---------+---------------+---------+-----------+----------+-------+ POP      Full           Yes      Yes                          +---------+---------------+---------+-----------+----------+-------+ PTV      Full                                                 +---------+---------------+---------+-----------+----------+-------+ PERO     Full                                                 +---------+---------------+---------+-----------+----------+-------+  2.74x4.64x1.03cm complex cystic structure with internal echoes at popliteal fossa medially, non-vascularity.  Left Venous Findings: +---------+---------------+---------+-----------+----------+-------+          CompressibilityPhasicitySpontaneityPropertiesSummary +---------+---------------+---------+-----------+----------+-------+ CFV      Full           Yes      Yes                          +---------+---------------+---------+-----------+----------+-------+ SFJ      Full                                                 +---------+---------------+---------+-----------+----------+-------+ FV Prox  Full                                                  +---------+---------------+---------+-----------+----------+-------+ FV Mid   Full                                                 +---------+---------------+---------+-----------+----------+-------+ FV DistalFull                                                 +---------+---------------+---------+-----------+----------+-------+ PFV      Full                                                 +---------+---------------+---------+-----------+----------+-------+ POP      Full           Yes      Yes                          +---------+---------------+---------+-----------+----------+-------+ PTV      Full                                                 +---------+---------------+---------+-----------+----------+-------+ PERO     Full                                                 +---------+---------------+---------+-----------+----------+-------+    Summary: Right: There is no evidence of deep vein thrombosis in the lower extremity. A cystic structure is found in the popliteal fossa. Left: There is no evidence of deep vein thrombosis in the lower extremity. No cystic structure found in the popliteal fossa.  *See table(s) above for measurements and observations. Electronically signed by Gretta Began MD on 05/21/2018 at 3:37:50 PM.    Final     Scheduled Meds: . ferrous sulfate  325 mg Oral Q breakfast  . folic acid  1 mg Oral Daily  . LORazepam  0-4 mg Intravenous Q12H  . multivitamin with minerals  1 tablet Oral Daily  . norethindrone-ethinyl estradiol  1 tablet Oral QHS  . thiamine  100 mg Oral Daily   Or  . thiamine  100 mg Intravenous Daily    Admission status: Inpatient: Based on patients clinical presentation and evaluation of above clinical data, I have made determination that patient meets Inpatient criteria at this time.  Patient requiring IV antibiotics, blood cultures growing E. coli, urine culture growing E.  coli sensitivities is currently pending.  Patient continues to be tachycardic and spiking fever.  Time spent: 20 min  Meredeth Ide   [facility] Pager [phone]. If 7PM-7AM, please contact night-coverage at [email or web address], Office  [phone]  password [email or web address]  05/22/2018, 12:11 PM  LOS: 2 days

## 2018-05-23 LAB — BASIC METABOLIC PANEL
ANION GAP: 7 (ref 5–15)
BUN: 6 mg/dL (ref 6–20)
CALCIUM: 7.7 mg/dL — AB (ref 8.9–10.3)
CO2: 21 mmol/L — ABNORMAL LOW (ref 22–32)
Chloride: 109 mmol/L (ref 98–111)
Creatinine, Ser: 0.76 mg/dL (ref 0.44–1.00)
GFR calc Af Amer: >60 mL/min (ref >60)
GLUCOSE: 86 mg/dL (ref 70–99)
Potassium: 3.2 mmol/L — ABNORMAL LOW (ref 3.5–5.1)
Sodium: 137 mmol/L (ref 135–145)

## 2018-05-23 LAB — POTASSIUM: Potassium: 3.5 mmol/L (ref 3.5–5.1)

## 2018-05-23 MED ORDER — POTASSIUM CHLORIDE CRYS ER 20 MEQ PO TBCR
40.0000 meq | EXTENDED_RELEASE_TABLET | Freq: Once | ORAL | Status: AC
  Administered 2018-05-23: 40 meq via ORAL
  Filled 2018-05-23: qty 2

## 2018-05-23 MED ORDER — AMOXICILLIN 500 MG PO CAPS: 500.0000 mg | ORAL_CAPSULE | Freq: Three times a day (TID) | ORAL | 0 refills | Status: DC

## 2018-05-23 MED ORDER — OXYCODONE HCL 5 MG PO TABS: 5.0000 mg | ORAL_TABLET | Freq: Four times a day (QID) | ORAL | 0 refills | Status: AC | PRN

## 2018-05-23 MED ORDER — LORAZEPAM 1 MG PO TABS
1.0000 mg | ORAL_TABLET | Freq: Four times a day (QID) | ORAL | Status: DC | PRN
  Administered 2018-05-23: 1 mg via ORAL
  Filled 2018-05-23: qty 1

## 2018-05-23 MED ORDER — POTASSIUM CHLORIDE CRYS ER 20 MEQ PO TBCR
40.0000 meq | EXTENDED_RELEASE_TABLET | ORAL | Status: DC
  Administered 2018-05-23: 40 meq via ORAL
  Filled 2018-05-23: qty 2

## 2018-05-23 MED ORDER — AMOXICILLIN 250 MG PO CAPS
500.0000 mg | ORAL_CAPSULE | Freq: Three times a day (TID) | ORAL | Status: DC
  Administered 2018-05-23: 500 mg via ORAL
  Filled 2018-05-23: qty 2

## 2018-05-23 MED ORDER — LORAZEPAM 2 MG/ML IJ SOLN: 1.0000 mg | Freq: Four times a day (QID) | INTRAMUSCULAR | Status: DC | PRN

## 2018-05-23 NOTE — Discharge Summary (Addendum)
Physician Discharge Summary  Natasha Pope [place] BJY:[id number] DOB: 19-Jul-1996 DOA: 05/19/2018  PCP: Orland Mustard, MD  Admit date: 05/19/2018 Discharge date: 05/23/2018  Time spent: 40 minutes  Recommendations for Outpatient Follow-up:  1. Follow up PCP in 2 weeks   Discharge Diagnoses:  Principal Problem:   Acute pyelonephritis Active Problems:   Tobacco abuse   Asthma   Sepsis (HCC)   Hypokalemia   Hypocalcemia   Hypomagnesemia   AKI (acute kidney injury) (HCC)   Macrocytic anemia   Electrolyte disturbance   Thrombocytopenia (HCC)   Alcohol use   Discharge Condition: Stable  Diet recommendation: Regular diet  Filed Weights   05/19/18 1521 05/19/18 2343  Weight: 68 kg 70.8 kg    History of present illness:  22 year old female with a history of asthma, tobacco use, alcohol use came to the hospital with right flank pain, fever chills, dysuria.  CT abdomen and pelvis with contrast revealed right pyonephritis.  Patient started on IV ceftriaxone  Hospital Course:   1. Sepsis secondary to acute pyelonephritis and E. coli bacteremia-patient presented with tachycardia, temp 101, tachypnea with respiration greater than 20.  Initially started on IV Rocephin.  Patient continued to spike fever, was switched to IV meropenem.  Urine and blood culture growing E. coli, which is pansensitive.  Was started on  IV ampicillin 1 g IV every 6 hours. Will discharge on Amoxicillin 500 mg po TID x 10 days  2. E. coli bacteremia-continue antibiotics as above.  3. Hypokalemia-potassium was replaced today, repeat potassium is 3.5.  4. Iron deficiency anemia/macrocytic anemia-folate 2.7, iron saturation 6%, continue iron and folate supplementation.  5. History of depression-patient wanted to leave AMA last night, was supposedly confused this morning.  Patient is AO x 3, no suicidal ideation, was seen by social work. No mental health needs.Patient says she just woke up from sleep and  got breathing treatmnet, which could have resulted in mild confusion.  6. Thrombocytopenia-likely from sepsis, platelet count on 01/31/2018 was 215.  Continue to monitor platelet count in the hospital.  Platelet count went down to 64,000, slowly improving.  Today 101  7. Alcohol use disorder- No withdrawal symptoms. Social work consulted.     Procedures:    Consultations:    Discharge Exam: Vitals:   05/23/18 0429 05/23/18 1504  BP: (!) 147/100 (!) 134/94  Pulse: (!) 114 (!) 103  Resp: 20 (!) 24  Temp: 98 F (36.7 C) 97.9 F (36.6 C)  SpO2: 99% 100%    General: Appears in no acute distress Cardiovascular: S1S2 RRR Respiratory: Clear bilaterally  Discharge Instructions   Discharge Instructions    Diet - low sodium heart healthy   Complete by:  As directed    Diet - low sodium heart healthy   Complete by:  As directed    Diet - low sodium heart healthy   Complete by:  As directed    Increase activity slowly   Complete by:  As directed    Increase activity slowly   Complete by:  As directed    Increase activity slowly   Complete by:  As directed      Allergies as of 05/23/2018   No Known Allergies     Medication List    STOP taking these medications   MULTIVITAMIN ADULT PO   potassium chloride 10 MEQ tablet Commonly known as:  K-DUR     TAKE these medications   amoxicillin 500 MG capsule Commonly known as:  AMOXIL Take  1 capsule (500 mg total) by mouth every 8 (eight) hours.   folic acid 1 MG tablet Commonly known as:  FOLVITE Take 1 tablet (1 mg total) by mouth daily.   hydrOXYzine 25 MG tablet Commonly known as:  ATARAX/VISTARIL Take 1/2-1 tablet up to three times a day as needed for anxiety What changed:    how much to take  how to take this  when to take this  reasons to take this  additional instructions   norethindrone-ethinyl estradiol 1-20 MG-MCG tablet Commonly known as:  JUNEL FE,GILDESS FE,LOESTRIN FE Take 1 tablet by  mouth daily.   nystatin 100000 UNIT/ML suspension Commonly known as:  MYCOSTATIN Take 5 mLs (500,000 Units total) by mouth 4 (four) times daily. Swish in mouth for as long as possible   oxyCODONE 5 MG immediate release tablet Commonly known as:  Oxy IR/ROXICODONE Take 1 tablet (5 mg total) by mouth every 6 (six) hours as needed for up to 4 days for moderate pain or severe pain.   triamcinolone cream 0.1 % Commonly known as:  KENALOG Can use twice a day for 7 days then off for 7 days What changed:    how much to take  how to take this  when to take this  reasons to take this  additional instructions      No Known Allergies Follow-up Information    Orland Mustard, MD Follow up in 2 week(s).   Specialty:  Family Medicine Contact information: [address] [place] [address] [phone]            The results of significant diagnostics from this hospitalization (including imaging, microbiology, ancillary and laboratory) are listed below for reference.    Significant Diagnostic Studies: Ct Angio Chest Pe W Or Wo Contrast  Result Date: 05/20/2018 CLINICAL DATA:  Elevated D-dimer EXAM: CT ANGIOGRAPHY CHEST WITH CONTRAST TECHNIQUE: Multidetector CT imaging of the chest was performed using the standard protocol during bolus administration of intravenous contrast. Multiplanar CT image reconstructions and MIPs were obtained to evaluate the vascular anatomy. CONTRAST:  ISOVUE-370 IOPAMIDOL (ISOVUE-370) INJECTION 76% COMPARISON:  None. FINDINGS: Cardiovascular: --Pulmonary arteries: Contrast injection is sufficient to demonstrate satisfactory opacification of the pulmonary arteries to the segmental level. There is no pulmonary embolus. The main pulmonary artery is within normal limits for size. --Aorta: Limited opacification of the aorta due to bolus timing optimization for the pulmonary arteries. Conventional 3 vessel aortic branching pattern. The aortic course and  caliber are normal. There is no aortic atherosclerosis. --Heart: Normal size. No pericardial effusion. Mediastinum/Nodes: No mediastinal, hilar or axillary lymphadenopathy. The visualized thyroid and thoracic esophageal course are unremarkable. Lungs/Pleura: Small pleural effusions with mild associated atelectasis. No nodules or masses. No pneumothorax. Upper Abdomen: Contrast bolus timing is not optimized for evaluation of the abdominal organs. Hepatic steatosis. Musculoskeletal: No chest wall abnormality. No acute or significant osseous findings. Review of the MIP images confirms the above findings. IMPRESSION: 1. No pulmonary embolus. 2. Small pleural effusions with associated atelectasis. 3. Hepatic steatosis. Electronically Signed   By: Deatra Robinson M.D.   On: 05/20/2018 22:28   Ct Abdomen Pelvis W Contrast  Result Date: 05/19/2018 CLINICAL DATA:  Right lower quadrant pain with nausea and vomiting x3 days. EXAM: CT ABDOMEN AND PELVIS WITH CONTRAST TECHNIQUE: Multidetector CT imaging of the abdomen and pelvis was performed using the standard protocol following bolus administration of intravenous contrast. CONTRAST:  ISOVUE-300 IOPAMIDOL (ISOVUE-300) INJECTION 61% COMPARISON:  None. FINDINGS: Lower  chest: No acute abnormality. Hepatobiliary: Steatosis of the liver. More focal fatty infiltration about the falciform ligament is also identified. The gallbladder is physiologically distended without stones. Pancreas: Normal Spleen: Normal Adrenals/Urinary Tract: Abnormal striated enhancement pattern of the right kidney with mild to moderate nephromegaly and perinephric fat stranding. Findings suggest the presence of pyelonephritis. No lobar nephronia. No hydroureteronephrosis. The urinary bladder is decompressed. Stomach/Bowel: Stomach is within normal limits. Appendix appears normal. No evidence of bowel wall thickening, distention, or inflammatory changes. Vascular/Lymphatic: No significant vascular  findings are present. No enlarged abdominal or pelvic lymph nodes. Reproductive: Uterus and bilateral adnexa are unremarkable. Other: Trace edema and fluid along the right paracolic gutter. Musculoskeletal: No acute or significant osseous findings. IMPRESSION: Nephromegaly with striated enhancement pattern and perinephric edema about the right kidney consistent with right-sided pyelonephritis. Electronically Signed   By: Tollie Eth M.D.   On: 05/19/2018 18:43   Vas [place] Lower Extremity Venous (dvt)  Result Date: 05/21/2018  Lower Venous Study Indications: Elevated D-dimer.  Limitations: Musculoskeletal features. Performing Technologist: Annamaria Helling  Examination Guidelines: A complete evaluation includes B-mode imaging, spectral Doppler, color Doppler, and power Doppler as needed of all accessible portions of each vessel. Bilateral testing is considered an integral part of a complete examination. Limited examinations for reoccurring indications may be performed as noted.  Right Venous Findings: +---------+---------------+---------+-----------+----------+-------+          CompressibilityPhasicitySpontaneityPropertiesSummary +---------+---------------+---------+-----------+----------+-------+ CFV      Full           Yes      Yes                          +---------+---------------+---------+-----------+----------+-------+ SFJ      Full                                                 +---------+---------------+---------+-----------+----------+-------+ FV Prox  Full                                                 +---------+---------------+---------+-----------+----------+-------+ FV Mid   Full                                                 +---------+---------------+---------+-----------+----------+-------+ FV DistalFull                                                 +---------+---------------+---------+-----------+----------+-------+ PFV      Full                                                  +---------+---------------+---------+-----------+----------+-------+ POP      Full           Yes      Yes                          +---------+---------------+---------+-----------+----------+-------+  PTV      Full                                                 +---------+---------------+---------+-----------+----------+-------+ PERO     Full                                                 +---------+---------------+---------+-----------+----------+-------+ 2.74x4.64x1.03cm complex cystic structure with internal echoes at popliteal fossa medially, non-vascularity.  Left Venous Findings: +---------+---------------+---------+-----------+----------+-------+          CompressibilityPhasicitySpontaneityPropertiesSummary +---------+---------------+---------+-----------+----------+-------+ CFV      Full           Yes      Yes                          +---------+---------------+---------+-----------+----------+-------+ SFJ      Full                                                 +---------+---------------+---------+-----------+----------+-------+ FV Prox  Full                                                 +---------+---------------+---------+-----------+----------+-------+ FV Mid   Full                                                 +---------+---------------+---------+-----------+----------+-------+ FV DistalFull                                                 +---------+---------------+---------+-----------+----------+-------+ PFV      Full                                                 +---------+---------------+---------+-----------+----------+-------+ POP      Full           Yes      Yes                          +---------+---------------+---------+-----------+----------+-------+ PTV      Full                                                  +---------+---------------+---------+-----------+----------+-------+ PERO     Full                                                 +---------+---------------+---------+-----------+----------+-------+  Summary: Right: There is no evidence of deep vein thrombosis in the lower extremity. A cystic structure is found in the popliteal fossa. Left: There is no evidence of deep vein thrombosis in the lower extremity. No cystic structure found in the popliteal fossa.  *See table(s) above for measurements and observations. Electronically signed by Gretta Began MD on 05/21/2018 at 3:37:50 PM.    Final     Microbiology: Recent Results (from the past 240 hour(s))  Urine culture     Status: Abnormal   Collection Time: 05/19/18  3:16 PM  Result Value Ref Range Status   Specimen Description   Final    URINE, CLEAN CATCH Performed at [facility], [address] [place], [place] [address]    Special Requests   Final    NONE Performed at [facility], [address], [place], [place] [address]    Culture >=100,000 COLONIES/mL ESCHERICHIA COLI (A)  Final   Report Status 05/22/2018 FINAL  Final   Organism ID, Bacteria ESCHERICHIA COLI (A)  Final      Susceptibility   Escherichia coli - MIC*    AMPICILLIN <=2 SENSITIVE Sensitive     CEFAZOLIN <=4 SENSITIVE Sensitive     CEFTRIAXONE <=1 SENSITIVE Sensitive     CIPROFLOXACIN <=0.25 SENSITIVE Sensitive     GENTAMICIN <=1 SENSITIVE Sensitive     IMIPENEM <=0.25 SENSITIVE Sensitive     NITROFURANTOIN <=16 SENSITIVE Sensitive     TRIMETH/SULFA <=20 SENSITIVE Sensitive     AMPICILLIN/SULBACTAM <=2 SENSITIVE Sensitive     PIP/TAZO <=4 SENSITIVE Sensitive     Extended ESBL NEGATIVE Sensitive     * >=100,000 COLONIES/mL ESCHERICHIA COLI  Culture, blood (routine x 2)     Status: Abnormal   Collection Time: 05/19/18  4:07 PM  Result Value Ref Range Status   Specimen Description   Final    BLOOD LEFT  ANTECUBITAL Performed at [facility], [address], [place], [place] [address]    Special Requests   Final    BOTTLES DRAWN AEROBIC AND ANAEROBIC Blood Culture results may not be optimal due to an excessive volume of blood received in culture bottles Performed at [facility], [address] [place], [place] [address]    Culture  Setup Time   Final    GRAM NEGATIVE RODS IN BOTH AEROBIC AND ANAEROBIC BOTTLES CRITICAL RESULT CALLED TO, READ BACK BY AND VERIFIED WITH: Erling Cruz PharmD 11:35 05/20/18 ([email or web address]) Performed at [facility], [address] [place], [place] [address]    Culture ESCHERICHIA COLI (A)  Final   Report Status 05/22/2018 FINAL  Final   Organism ID, Bacteria ESCHERICHIA COLI  Final      Susceptibility   Escherichia coli - MIC*    AMPICILLIN <=2 SENSITIVE Sensitive     CEFAZOLIN <=4 SENSITIVE Sensitive     CEFEPIME <=1 SENSITIVE Sensitive     CEFTAZIDIME <=1 SENSITIVE Sensitive     CEFTRIAXONE <=1 SENSITIVE Sensitive     CIPROFLOXACIN <=0.25 SENSITIVE Sensitive     GENTAMICIN <=1 SENSITIVE Sensitive     IMIPENEM <=0.25 SENSITIVE Sensitive     TRIMETH/SULFA <=20 SENSITIVE Sensitive     AMPICILLIN/SULBACTAM <=2 SENSITIVE Sensitive     PIP/TAZO <=4 SENSITIVE Sensitive     Extended ESBL NEGATIVE Sensitive     * ESCHERICHIA COLI  Blood Culture ID Panel (Reflexed)     Status: Abnormal   Collection Time: 05/19/18  4:07  PM  Result Value Ref Range Status   Enterococcus species NOT DETECTED NOT DETECTED Final   Listeria monocytogenes NOT DETECTED NOT DETECTED Final   Staphylococcus species NOT DETECTED NOT DETECTED Final   Staphylococcus aureus (BCID) NOT DETECTED NOT DETECTED Final   Streptococcus species NOT DETECTED NOT DETECTED Final   Streptococcus agalactiae NOT DETECTED NOT DETECTED Final   Streptococcus pneumoniae NOT DETECTED NOT DETECTED Final   Streptococcus pyogenes NOT DETECTED NOT DETECTED Final    Acinetobacter baumannii NOT DETECTED NOT DETECTED Final   Enterobacteriaceae species DETECTED (A) NOT DETECTED Final    Comment: Enterobacteriaceae represent a large family of gram-negative bacteria, not a single organism. CRITICAL RESULT CALLED TO, READ BACK BY AND VERIFIED WITH: Erling Cruz PharmD 11:35 05/20/18 ([email or web address])    Enterobacter cloacae complex NOT DETECTED NOT DETECTED Final   Escherichia coli DETECTED (A) NOT DETECTED Final    Comment: CRITICAL RESULT CALLED TO, READ BACK BY AND VERIFIED WITH: Erling Cruz PharmD 11:35 05/20/18 ([email or web address])    Klebsiella oxytoca NOT DETECTED NOT DETECTED Final   Klebsiella pneumoniae NOT DETECTED NOT DETECTED Final   Proteus species NOT DETECTED NOT DETECTED Final   Serratia marcescens NOT DETECTED NOT DETECTED Final   Carbapenem resistance NOT DETECTED NOT DETECTED Final   Haemophilus influenzae NOT DETECTED NOT DETECTED Final   Neisseria meningitidis NOT DETECTED NOT DETECTED Final   Pseudomonas aeruginosa NOT DETECTED NOT DETECTED Final   Candida albicans NOT DETECTED NOT DETECTED Final   Candida glabrata NOT DETECTED NOT DETECTED Final   Candida krusei NOT DETECTED NOT DETECTED Final   Candida parapsilosis NOT DETECTED NOT DETECTED Final   Candida tropicalis NOT DETECTED NOT DETECTED Final  Culture, blood (routine x 2)     Status: None (Preliminary result)   Collection Time: 05/20/18 12:36 AM  Result Value Ref Range Status   Specimen Description   Final    BLOOD RIGHT ANTECUBITAL Performed at [facility], [address], [place], [place] [address]    Special Requests   Final    BOTTLES DRAWN AEROBIC AND ANAEROBIC Blood Culture adequate volume Performed at [facility], [address], [place], [place] [address]    Culture   Final    NO GROWTH 3 DAYS Performed at [facility], [address], [place], [place] [address]    Report Status PENDING  Incomplete     Labs: Basic Metabolic  Panel: Recent Labs  Lab 05/19/18 1839 05/19/18 2239  05/19/18 2320 05/20/18 0344 05/20/18 0552 05/20/18 1348 05/21/18 0729 05/22/18 0538 05/23/18 1157 05/23/18 1559  NA  --   --    < >  --  138 138  --  137 136 137  --   K 2.6*  --    < >  --  2.0* 2.4* 2.9* 3.3* 3.2* 3.2* 3.5  CL  --   --    < >  --  108 106  --  109 109 109  --   CO2  --   --   --   --  20* 20*  --  18* 18* 21*  --   GLUCOSE  --   --    < >  --  97 94  --  78 106* 86  --   BUN  --   --    < >  --  11 11  --  7 7 6   --   CREATININE  --   --    < >  --  1.10* 1.13*  --  0.79 0.91 0.76  --   CALCIUM  --   --   --   --  6.4* 6.5*  --  7.3* 7.8* 7.7*  --   MG 0.7* 1.5*  --   --  2.1  --   --   --  1.9  --   --   PHOS  --   --   --  1.6*  --   --   --   --   --   --   --    < > = values in this interval not displayed.   Liver Function Tests: Recent Labs  Lab 05/19/18 1607 05/22/18 0538  AST 39 49*  ALT 21 21  ALKPHOS 61 105  BILITOT 2.4* 1.8*  PROT 5.6* 5.4*  ALBUMIN 2.7* 2.5*   Recent Labs  Lab 05/19/18 1607  LIPASE 15   No results for input(s): AMMONIA in the last 168 hours. CBC: Recent Labs  Lab 05/19/18 1607 05/19/18 1834 05/19/18 2252 05/20/18 0552 05/21/18 0548 05/22/18 0538  WBC 20.0*  --   --  16.9* 11.2* 10.1  NEUTROABS 17.7*  --   --   --   --   --   HGB 9.7* 9.9* 10.2* 8.8* 11.8* 9.3*  HCT 28.3* 29.0* 30.0* 26.5* 34.0* 27.0*  MCV 107.2*  --   --  109.1* 104.9* 105.9*  PLT 105*  --   --  87* 64* 101*    CBG: Recent Labs  Lab 05/19/18 1521  GLUCAP 98       Signed:  Meredeth Ide MD.  [facility] 05/23/2018, 4:40 PM

## 2018-05-23 NOTE — Care Management Note (Signed)
Case Management Note  Patient Details  Name: Natasha Pope MRN: [id number] Date of Birth: September 24, 1995  Subjective/Objective:Per attending. No psych cons needed. Patient for d/c home. No CM needs.                    Action/Plan:d/c home.   Expected Discharge Date:  (unknown)               Expected Discharge Plan:  Home/Self Care  In-House Referral:  Clinical Social Work  Discharge planning Services  CM Consult  Post Acute Care Choice:    Choice offered to:     DME Arranged:    DME Agency:     HH Arranged:    HH Agency:     Status of Service:  Completed, signed off  If discussed at [employer], dates discussed:    Additional Comments:  Lanier Clam, RN 05/23/2018, 11:04 AM

## 2018-05-23 NOTE — Care Management Note (Signed)
Case Management Note  Patient Details  Name: Natasha Pope MRN: [id number] Date of Birth: March 02, 1996  Subjective/Objective:  Admitted w/Acute pyelonephritis. Hx: Depression,etoh/tobacco use. From home,grandmother. CSW-see note-patient declines etoh/tobacco resources. Awaiting Psych cons.                  Action/Plan:d/c plan home.   Expected Discharge Date:  (unknown)               Expected Discharge Plan:  Home/Self Care  In-House Referral:  Clinical Social Work  Discharge planning Services  CM Consult  Post Acute Care Choice:    Choice offered to:     DME Arranged:    DME Agency:     HH Arranged:    HH Agency:     Status of Service:     If discussed at [employer], dates discussed:    Additional Comments:  Lanier Clam, RN 05/23/2018, 9:13 AM

## 2018-05-23 NOTE — Progress Notes (Signed)
Patient discharged to home w/ family. Given all belongings, instructions, prescriptions. Patient verbalized understanding of all instructions. Escorted to pov via w/c.

## 2018-05-25 ENCOUNTER — Encounter (HOSPITAL_COMMUNITY): Payer: Self-pay | Admitting: *Deleted

## 2018-05-25 ENCOUNTER — Ambulatory Visit (HOSPITAL_COMMUNITY)
Admission: EM | Admit: 2018-05-25 | Discharge: 2018-05-25 | Disposition: A | Discharge status: Home / Self Care | Payer: Managed Care, Other (non HMO) | Attending: Family Medicine | Admitting: Family Medicine

## 2018-05-25 DIAGNOSIS — E8770 Fluid overload, unspecified: Secondary | ICD-10-CM | POA: Diagnosis not present

## 2018-05-25 HISTORY — DX: Sepsis, unspecified organism: A41.9

## 2018-05-25 HISTORY — DX: Tubulo-interstitial nephritis, not specified as acute or chronic: N12

## 2018-05-25 LAB — POCT I-STAT, CHEM 8
BUN: <3 mg/dL — ABNORMAL LOW (ref 6–20)
Calcium, Ion: 1.11 mmol/L — ABNORMAL LOW (ref 1.15–1.40)
Chloride: 104 mmol/L (ref 98–111)
Creatinine, Ser: 0.5 mg/dL (ref 0.44–1.00)
GLUCOSE: 90 mg/dL (ref 70–99)
HEMATOCRIT: 24 % — AB (ref 36.0–46.0)
HEMOGLOBIN: 8.2 g/dL — AB (ref 12.0–15.0)
POTASSIUM: 3.4 mmol/L — AB (ref 3.5–5.1)
SODIUM: 140 mmol/L (ref 135–145)
TCO2: 24 mmol/L (ref 22–32)

## 2018-05-25 LAB — CULTURE, BLOOD (ROUTINE X 2)
Culture: NO GROWTH
SPECIAL REQUESTS: ADEQUATE

## 2018-05-25 MED ORDER — PRENATAL COMPLETE 14-0.4 MG PO TABS: ORAL_TABLET | ORAL | 30 refills | Status: DC

## 2018-05-25 NOTE — Discharge Instructions (Addendum)
Please continue your anitbiotics. You creatinine is good today. You kidneys will continue to move the extra water out over the next few days.  Please start prenatal vitamins. I have sent a script.

## 2018-05-25 NOTE — ED Triage Notes (Signed)
Assessment per H. Wieters, PA. 

## 2018-05-25 NOTE — ED Provider Notes (Signed)
05/25/2018 4:53 PM   DOB: [birth year] / MRN: [id number]  SUBJECTIVE:  Natasha Pope is a 22 y.o. female presenting for bilateral lower leg edema that started after receiving volume resusitation for the treatment of pyelonephritis mediated sepsis.  Urine culuture show pan sensitivity.  She is taking amox as directed. Overall feels much better. No dizziness, chest pain, SOB.    She has No Known Allergies.   She  has a past medical history of Asthma, Pyelonephritis, and Sepsis (HCC).    She  reports that she quit smoking about 2 weeks ago. Her smoking use included cigarettes. She smoked 0.50 packs per day. She has never used smokeless tobacco. She reports that she drinks alcohol. She reports that she does not use drugs. She  has no sexual activity history on file. The patient  has a past surgical history that includes dental procedure.  Her family history includes Alcohol abuse in her father, maternal grandfather, and paternal grandmother; Depression in her mother; Hyperlipidemia in her maternal grandmother; Hypertension in her father and paternal grandfather; Learning disabilities in her sister; Miscarriages / Stillbirths in her mother.  Review of Systems  Constitutional: Negative for diaphoresis.  Respiratory: Negative for cough, hemoptysis, sputum production, shortness of breath and wheezing.   Cardiovascular: Negative for chest pain, orthopnea and leg swelling.  Gastrointestinal: Negative for nausea.  Neurological: Negative for dizziness.    OBJECTIVE:  BP 117/61 (BP Location: Left Arm)   Pulse 76   Temp 98.1 F (36.7 C) (Oral)   Resp 18   LMP 05/13/2018 (Within Days)   SpO2 100%   Wt Readings from Last 3 Encounters:  05/19/18 156 lb 1.6 oz (70.8 kg)  03/03/18 149 lb 3.2 oz (67.7 kg)  01/31/18 148 lb 6.4 oz (67.3 kg)   Temp Readings from Last 3 Encounters:  05/25/18 98.1 F (36.7 C) (Oral)  05/23/18 97.9 F (36.6 C) (Oral)  05/19/18 100.3 F (37.9 C) (Temporal)   BP  Readings from Last 3 Encounters:  05/25/18 117/61  05/23/18 (!) 134/94  05/19/18 96/62   Pulse Readings from Last 3 Encounters:  05/25/18 76  05/23/18 (!) 103  05/19/18 100    Physical Exam  Constitutional: She is oriented to person, place, and time. She appears well-nourished. No distress.  Eyes: Pupils are equal, round, and reactive to light. EOM are normal.  Cardiovascular: Normal rate, regular rhythm, S1 normal, S2 normal, normal heart sounds and intact distal pulses. Exam reveals no gallop, no friction rub and no decreased pulses.  No murmur heard. Pulmonary/Chest: Effort normal. No stridor. No respiratory distress. She has no wheezes. She has no rales.  Abdominal: She exhibits no distension.  Musculoskeletal: She exhibits no edema.       Feet:  Neurological: She is alert and oriented to person, place, and time. No cranial nerve deficit. Gait normal.  Skin: Skin is dry. She is not diaphoretic.  Psychiatric: She has a normal mood and affect.  Vitals reviewed.   Results for orders placed or performed during the hospital encounter of 05/25/18 (from the past 72 hour(s))  I-STAT, chem 8     Status: Abnormal   Collection Time: 05/25/18  4:37 PM  Result Value Ref Range   Sodium 140 135 - 145 mmol/L   Potassium 3.4 (L) 3.5 - 5.1 mmol/L   Chloride 104 98 - 111 mmol/L   BUN <3 (L) 6 - 20 mg/dL   Creatinine, Ser 4.09 0.44 - 1.00 mg/dL   Glucose, Bld 90  70 - 99 mg/dL   Calcium, Ion 1.61 (L) 1.15 - 1.40 mmol/L   TCO2 24 22 - 32 mmol/L   Hemoglobin 8.2 (L) 12.0 - 15.0 g/dL   HCT 09.6 (L) 04.5 - 40.9 %    No results found.  ASSESSMENT AND PLAN:   Hypervolemia, unspecified hypervolemia type    Discharge Instructions     Please continue your anitbiotics. You creatinine is good today. You kidneys will continue to move the extra water out over the next few days.  Please start prenatal vitamins. I have sent a script.         The patient is advised to call or return to  clinic if she does not see an improvement in symptoms, or to seek the care of the closest emergency department if she worsens with the above plan.   Deliah Boston, MHS, PA-C 05/25/2018 4:53 PM   Ofilia Neas, PA-C 05/25/18 1653

## 2018-05-26 ENCOUNTER — Telehealth: Payer: Self-pay | Admitting: *Deleted

## 2018-05-26 NOTE — Telephone Encounter (Signed)
Per chart review: Admit date: 05/19/2018 Discharge date: 05/23/2018  Time spent: 40 minutes  Recommendations for Outpatient Follow-up:  1. Follow up PCP in 2 weeks   Discharge Diagnoses:  Principal Problem:   Acute pyelonephritis Active Problems:   Tobacco abuse   Asthma   Sepsis (HCC)   Hypokalemia   Hypocalcemia   Hypomagnesemia   AKI (acute kidney injury) (HCC)   Macrocytic anemia   Electrolyte disturbance   Thrombocytopenia (HCC)   Alcohol use   Discharge Condition: Stable  Diet recommendation: Regular diet _______________________________________________________________ Per telephone call:  Transition Care Management Follow-up Telephone Call   Date discharged? 05/23/18   How have you been since you were released from the hospital? "ok" patient states she followed up at urgent care yesterday.    Do you understand why you were in the hospital? yes   Do you understand the discharge instructions? yes   Where were you discharged to? Home   Items Reviewed:  Medications reviewed: yes  Allergies reviewed: yes  Dietary changes reviewed: yes  Referrals reviewed: yes   Functional Questionnaire:   Activities of Daily Living (ADLs):   She states they are independent in the following: ambulation, bathing and hygiene, feeding, continence, grooming, toileting and dressing States they require assistance with the following: none   Any transportation issues/concerns?: no   Any patient concerns? no   Confirmed importance and date/time of follow-up visits scheduled yes  Provider Appointment booked with Dr Artis Flock 06/09/18. Patient cancelled lab appt for 06/06/18.   Confirmed with patient if condition begins to worsen call PCP or go to the ER.  Patient was given the office number and encouraged to call back with question or concerns.  : yes

## 2018-05-27 ENCOUNTER — Telehealth: Payer: Self-pay

## 2018-05-27 ENCOUNTER — Telehealth: Payer: Self-pay | Admitting: Family Medicine

## 2018-05-27 NOTE — Telephone Encounter (Signed)
Pt's grandmother, [name] called in and had over 10 min conversation with grandmother.  She is very concerned about patient since she was in the hospital.  Grandmother states that she thought pt appeared to be very swollen all over.  States that doctor in hospital assured her that was a normal reaction.  Pt eating and drinking fine and no more vomiting.  Grandmother very concerned about pt's alcohol intake.  States that she feels it is still very excessive and knows that this can cause pt to become very sick and can affect her liver functions and poss send her back to the hospital.    Pt's grandmother wanted Dr. Artis Flock to be aware of what was going on prior to pt coming in for hospital follow up visit on 11/18.

## 2018-05-27 NOTE — Telephone Encounter (Signed)
See note  Copied from CRM [id number]. Topic: General - Other >> May 27, 2018 11:34 AM [name] wrote:  Pt grandmother ask that Natasha Pope give her a call.

## 2018-05-27 NOTE — Telephone Encounter (Signed)
See telephone encounter dated 11/5.

## 2018-05-29 ENCOUNTER — Ambulatory Visit (INDEPENDENT_AMBULATORY_CARE_PROVIDER_SITE_OTHER): Payer: Managed Care, Other (non HMO) | Admitting: Family Medicine

## 2018-05-29 ENCOUNTER — Telehealth: Payer: Self-pay

## 2018-05-29 ENCOUNTER — Encounter: Payer: Self-pay | Admitting: Family Medicine

## 2018-05-29 ENCOUNTER — Telehealth: Payer: Self-pay | Admitting: Family Medicine

## 2018-05-29 VITALS — BP 138/92 | HR 96 | Temp 98.4°F | Resp 20 | Ht 67.0 in | Wt 170.2 lb

## 2018-05-29 DIAGNOSIS — R748 Abnormal levels of other serum enzymes: Secondary | ICD-10-CM

## 2018-05-29 DIAGNOSIS — D539 Nutritional anemia, unspecified: Secondary | ICD-10-CM

## 2018-05-29 DIAGNOSIS — E876 Hypokalemia: Secondary | ICD-10-CM

## 2018-05-29 DIAGNOSIS — Z87448 Personal history of other diseases of urinary system: Secondary | ICD-10-CM | POA: Diagnosis not present

## 2018-05-29 DIAGNOSIS — N10 Acute tubulo-interstitial nephritis: Secondary | ICD-10-CM

## 2018-05-29 LAB — CBC WITH DIFFERENTIAL/PLATELET
Basophils Absolute: 0.1 10*3/uL (ref 0.0–0.1)
Basophils Relative: 0.8 % (ref 0.0–3.0)
EOS PCT: 0.8 % (ref 0.0–5.0)
Eosinophils Absolute: 0.1 10*3/uL (ref 0.0–0.7)
HCT: 23.4 % — CL (ref 36.0–46.0)
Hemoglobin: 8 g/dL — CL (ref 12.0–15.0)
LYMPHS ABS: 1.6 10*3/uL (ref 0.7–4.0)
Lymphocytes Relative: 22.3 % (ref 12.0–46.0)
MCHC: 34.2 g/dL (ref 30.0–36.0)
MCV: 109 fl — AB (ref 78.0–100.0)
MONO ABS: 0.3 10*3/uL (ref 0.1–1.0)
MONOS PCT: 4.6 % (ref 3.0–12.0)
NEUTROS ABS: 5.1 10*3/uL (ref 1.4–7.7)
NEUTROS PCT: 71.5 % (ref 43.0–77.0)
PLATELETS: 788 10*3/uL — AB (ref 150.0–400.0)
RBC: 2.15 Mil/uL — AB (ref 3.87–5.11)
RDW: 17.2 % — ABNORMAL HIGH (ref 11.5–15.5)
WBC: 7.2 10*3/uL (ref 4.0–10.5)

## 2018-05-29 LAB — COMPREHENSIVE METABOLIC PANEL
ALT: 20 U/L (ref 0–35)
AST: 21 U/L (ref 0–37)
Albumin: 3 g/dL — ABNORMAL LOW (ref 3.5–5.2)
Alkaline Phosphatase: 84 U/L (ref 39–117)
BUN: 1 mg/dL — AB (ref 6–23)
CHLORIDE: 103 meq/L (ref 96–112)
CO2: 30 meq/L (ref 19–32)
Calcium: 8 mg/dL — ABNORMAL LOW (ref 8.4–10.5)
Creatinine, Ser: 0.44 mg/dL (ref 0.40–1.20)
GFR: 190.92 mL/min (ref >60.00)
GLUCOSE: 78 mg/dL (ref 70–99)
POTASSIUM: 3.7 meq/L (ref 3.5–5.1)
SODIUM: 144 meq/L (ref 135–145)
Total Bilirubin: 0.9 mg/dL (ref 0.2–1.2)
Total Protein: 5.4 g/dL — ABNORMAL LOW (ref 6.0–8.3)

## 2018-05-29 LAB — MAGNESIUM: Magnesium: 1.6 mg/dL (ref 1.5–2.5)

## 2018-05-29 MED ORDER — FUROSEMIDE 40 MG PO TABS: ORAL_TABLET | ORAL | 0 refills | Status: DC

## 2018-05-29 MED ORDER — POTASSIUM CHLORIDE CRYS ER 20 MEQ PO TBCR: 20.0000 meq | EXTENDED_RELEASE_TABLET | Freq: Every day | ORAL | 3 refills | Status: DC

## 2018-05-29 NOTE — Telephone Encounter (Signed)
See other telephone encounter with Dr Jennette Kettle response.

## 2018-05-29 NOTE — Telephone Encounter (Signed)
Received lab results call from [facility] on patient  hct 23.4 hgb 8

## 2018-05-29 NOTE — Progress Notes (Signed)
Patient: Natasha Pope MRN: [id number] DOB: 04-20-1996 PCP: Orland Mustard, MD     Subjective:  Chief Complaint  Patient presents with  . Hospitalization Follow-up    HPI: The patient is a 22 y.o. female who presents today for follow up from hospital.  Admit date: 05/19/2018 Discharge date: 05/23/2018  Admitted for acute pyelonephritis with sepsis as well as multiple electrolyte abnormalities. She had AKI as well.   1) sepsis: secondary to acute pyelonephritis and e.coli bacteremia. Initially started on IV rocephin. She continued to spike fever and was switched ot IV meropenem. Urine and blood grew e.coli which was pansensitive. Started on IV ampicillin. Discharged on amoxicillin 500mg  TID x 10 days. She is still on her antibiotics.   2) e.coli bacteremia: per above  3) hypokalemia: repleted in hospital   4) hx of depression: Wanted to leave AMA and had some confusion. Denied any SI and was seen by social work. No mental health needs.   5) thrombocytopenia: likely secondary to sepsis. Lowest was 64,000. Improved to 101 on discharge.   6) elevated liver enzymes with hx of alcohol abuse. She drank 1.5 glasses of wine last night and then 2 beers. Not drinking daily, but still having alcohol.   7) lower leg edema: complaining of painful le edema. Has improved some. Not wearing compression hose or elevating legs. requestin oxycodone for this. 20 pound weight gain since hospital stay. No cough/shortness of breath.   Review of Systems  Constitutional: Positive for fatigue. Negative for chills and fever.  Respiratory: Negative for shortness of breath.   Cardiovascular: Negative for chest pain.  Gastrointestinal: Negative for abdominal pain, blood in stool, constipation, diarrhea, nausea and vomiting.  Genitourinary: Positive for frequency. Negative for difficulty urinating, dysuria, hematuria and pelvic pain.  Musculoskeletal: Positive for back pain. Negative for neck pain.        Lower back pain  Neurological: Positive for headaches. Negative for dizziness, tremors and weakness.    Allergies Patient has No Known Allergies.  Past Medical History Patient  has a past medical history of Asthma, Pyelonephritis, and Sepsis (HCC).  Surgical History Patient  has a past surgical history that includes dental procedure.  Family History [name]'s family history includes Alcohol abuse in her father, maternal grandfather, and paternal grandmother; Depression in her mother; Hyperlipidemia in her maternal grandmother; Hypertension in her father and paternal grandfather; Learning disabilities in her sister; Miscarriages / Stillbirths in her mother.  Social History Patient  reports that she quit smoking about 2 weeks ago. Her smoking use included cigarettes. She smoked 0.50 packs per day. She has never used smokeless tobacco. She reports that she drinks alcohol. She reports that she does not use drugs.    Objective: Vitals:   05/29/18 1030  BP: (!) 138/92  Pulse: 96  Resp: 20  Temp: 98.4 F (36.9 C)  TempSrc: Oral  Weight: 170 lb 3.2 oz (77.2 kg)  Height: 5\' 7"  (1.702 m)    Body mass index is 26.66 kg/m.  Physical Exam  Constitutional: She is oriented to person, place, and time. She appears well-developed and well-nourished.  Neck: Normal range of motion. Neck supple.  Cardiovascular: Normal rate, regular rhythm and normal heart sounds.  No murmur heard. Bilateral LE edema 3+ from mid calf to foot   Pulmonary/Chest: Effort normal and breath sounds normal.  Abdominal: Soft. Bowel sounds are normal. There is no tenderness.  Musculoskeletal:  No cva tenderness   Neurological: She is alert and oriented to person,  place, and time.  Skin: Capillary refill takes less than 2 seconds.  Vitals reviewed.      Assessment/plan: 1. Acute pyelonephritis Resolved. Doing well. Continue to take antibiotics and complete course. Repeat blood cultures negative x 5 days for  final. Urine culture with e.coli and pansensitive. Reviewed all hospital notes/labs/imaging.   2. Hypokalemia repleted in hospital, but has hx of chronically low potassium. Her low potassium and magnesium during this stay could be due to her intractable vomiting, but I wonder if she has alcohol induced tubular dysfunction. She has not abstained from alcohol to see if this will resolve.   Checking mag today and starting her on daily potassium. discussed may be worth working up with urine studies in future.  - Comprehensive metabolic panel - Magnesium  3. Elevated liver enzymes Fatty liver seen on CT. Never came back for labs and never scheduled her ultrasound from last visit. Discussed with her she needs to be more compliant and has to stop drinking. Does not seem impressed to stop. States she has cut back, but continues to drink. Ordering ultrasound again.   4. Macrocytic anemia Known alcohol abuse. On folic acid/thiamine. Had thrombocytopenia and anemia in hospital likely secondary to sepsis, so will recheck today.  - CBC with Differential/Platelet  5. Bilateral lower leg edema  -she is requesting oxycodone for edema pain. Discussed I will not give her this and we need to get fluid off of her. She is a little tricky as we struggle with her potassium level. Im going to have her take 40mg  of lasix x 3 days and put her on of potassium x 3 days then drop down to daily. I also want her to start her compression hose, walk and elevate her legs when sitting. The edema will resolve. She was heavily bolused secondary to sepsis. Again discussed no pain medication warranted and pain will resolve once we get fluid off. Wrote down instructions for medication, went over them and then had nurse go over them with her.     Return if symptoms worsen or fail to improve.   Orland Mustard, MD [place] [facility]   05/29/2018

## 2018-05-29 NOTE — Telephone Encounter (Signed)
[name], Med Tech from [facility] lab called to report pt's hgb 8.0, and hct 23.4 drawn 05/29/18 at 1121; values repeated for verification;notified [name] at [facility]; will also route to office for notification; the pt was last seen by Dr Orland Mustard.

## 2018-05-29 NOTE — Telephone Encounter (Signed)
hgb from 11/3 was 8.2. Patient asymptomatic. Will recheck in 2 weeks.

## 2018-05-29 NOTE — Telephone Encounter (Signed)
See note

## 2018-05-29 NOTE — Patient Instructions (Addendum)
I want you to take 2 potassium pills on the days you take the lasix (so 3 days you will take ) then go back to daily  Give you lasix to help get fluid off your legs. Will take one pill x 3 days.    Doing labs today. And getting a liver ultrasound. Make sure you get this scheduled.

## 2018-05-30 ENCOUNTER — Telehealth: Payer: Self-pay | Admitting: Family Medicine

## 2018-05-30 ENCOUNTER — Other Ambulatory Visit: Payer: Self-pay | Admitting: Family Medicine

## 2018-05-30 DIAGNOSIS — E876 Hypokalemia: Secondary | ICD-10-CM

## 2018-05-30 DIAGNOSIS — D638 Anemia in other chronic diseases classified elsewhere: Secondary | ICD-10-CM

## 2018-05-30 NOTE — Addendum Note (Signed)
Addended by: Orland Mustard on: 05/30/2018 10:46 AM   Modules accepted: Orders

## 2018-05-30 NOTE — Telephone Encounter (Signed)
Copied from CRM [id number]. Topic: Quick Communication - See Telephone Encounter >> May 30, 2018  3:44 PM Terisa Starr wrote: CRM for notification. See Telephone encounter for: 05/30/18.  Pt is requesting her lab results from yesterday. She also states that the hospital gave her over 20 pounds of fluid. She said that Dr Artis Flock prescribed her furosemide (LASIX) 40 MG tablet and told her to take potassium along with this, she is requesting 3 more pills of furosemide (LASIX) 40 MG tablet. She said that it is working but still has some swelling and tomorrow she will take her last pill. Please advise.

## 2018-05-30 NOTE — Telephone Encounter (Signed)
See note

## 2018-05-30 NOTE — Telephone Encounter (Signed)
Routing to correct office

## 2018-06-02 ENCOUNTER — Telehealth: Payer: Self-pay | Admitting: Family Medicine

## 2018-06-02 NOTE — Telephone Encounter (Signed)
Caller states she would like to know if the results to her lab testing are ready. Caller also states she would like to schedule an appointment for her vision. Caller states her vision has been blurry and white after her visit to the hospital.

## 2018-06-02 NOTE — Telephone Encounter (Signed)
I have already contacted patient and spoke to her in regard to her lab results.  (See result note from 11/7)  Advised patient per Dr. Artis Flock that we will place referral for ophthamology and specialist office will contact her to schedule appt for her vision.  Pt verbalized understanding of this.

## 2018-06-02 NOTE — Telephone Encounter (Signed)
Called and spoke with patient and advised her per Dr. Artis Flock that she does not need additional Lasix sent to pharmacy.  Dr. Artis Flock prescribed short term course of Lasix due to patient's electrolytes being so out of whack and did not want to affect her potassium levels with any further treatment of Lasix.  Pt verbalized understanding.

## 2018-06-06 ENCOUNTER — Other Ambulatory Visit: Payer: Managed Care, Other (non HMO)

## 2018-06-09 ENCOUNTER — Inpatient Hospital Stay: Payer: Managed Care, Other (non HMO) | Admitting: Family Medicine

## 2018-07-02 ENCOUNTER — Other Ambulatory Visit: Payer: Managed Care, Other (non HMO)

## 2018-09-16 ENCOUNTER — Other Ambulatory Visit: Payer: Self-pay | Admitting: Family Medicine

## 2018-09-16 NOTE — Telephone Encounter (Signed)
Copied from CRM [id number]. Topic: Quick Communication - Rx Refill/Question >> Sep 16, 2018  3:34 PM Angela Nevin wrote: Medication: norethindrone-ethinyl estradiol (JUNEL FE,GILDESS FE,LOESTRIN FE) 1-20 MG-MCG tablet and  potassium chloride SA (K-DUR,KLOR-CON) 20 MEQ tablet    Patient is requesting refills of these medications.   Preferred Pharmacy (with phone number or street name):[facility] [address], [place] - [address] [phone] (Phone) [phone] (Fax)

## 2018-09-16 NOTE — Telephone Encounter (Signed)
Requested medication (s) are due for refill today -yes  Requested medication (s) are on the active medication list -yes  Future visit scheduled -no  Last refill: 05/19/18  Notes to clinic: Patient is requesting refill of Rx- passes protocol- but sent for review due to anxiety use.  Requested Prescriptions  Pending Prescriptions Disp Refills   hydrOXYzine (ATARAX/VISTARIL) 25 MG tablet 60 tablet 3    Sig: Take 1/2-1 tablet up to three times a day as needed for anxiety     Ear, Nose, and Throat:  Antihistamines Passed - 09/16/2018  3:43 PM      Passed - Valid encounter within last 12 months    Recent Outpatient Visits          3 months ago Acute pyelonephritis   [facility], Revonda Standard, MD   4 months ago Intractable vomiting with nausea, unspecified vomiting type   [facility], Revonda Standard, MD   6 months ago Cervical cancer screening   [facility], Revonda Standard, MD   7 months ago Encounter for screening for HIV   [facility], Revonda Standard, MD              Requested Prescriptions  Pending Prescriptions Disp Refills   hydrOXYzine (ATARAX/VISTARIL) 25 MG tablet 60 tablet 3    Sig: Take 1/2-1 tablet up to three times a day as needed for anxiety     Ear, Nose, and Throat:  Antihistamines Passed - 09/16/2018  3:43 PM      Passed - Valid encounter within last 12 months    Recent Outpatient Visits          3 months ago Acute pyelonephritis   [facility] [facility], Revonda Standard, MD   4 months ago Intractable vomiting with nausea, unspecified vomiting type   [facility], MD   6 months ago Cervical cancer screening   [facility], MD   7 months ago Encounter for screening for HIV    [facility], [facility]

## 2018-09-16 NOTE — Telephone Encounter (Signed)
Copied from CRM [id number]. Topic: Quick Communication - Rx Refill/Question >> Sep 16, 2018  3:36 PM Angela Nevin wrote: Medication:  hydrOXYzine (ATARAX/VISTARIL) 25 MG tablet  Patient is requesting a refill of this medication.   Preferred Pharmacy (with phone number or street name):[facility] [address] [place] - [address]  [phone] (Phone) [phone] (Fax)

## 2018-09-16 NOTE — Telephone Encounter (Signed)
Requested medication (s) are due for refill today -yes  Requested medication (s) are on the active medication list -yes  Future visit scheduled -no  Last refill: oral contraceptive- 05/19/18                  Potassium- 05/25/18  Notes to clinic: Rx refill request sent for PCP review- patient fails OCP refill with elevated BP- want to make sure to continue. Patient was given potassium due to severe illness- sent for review to continue at this time.  Requested Prescriptions  Pending Prescriptions Disp Refills   norethindrone-ethinyl estradiol (JUNEL FE,GILDESS FE,LOESTRIN FE) 1-20 MG-MCG tablet 3 Package 2    Sig: Take 1 tablet by mouth daily.     OB/GYN:  Contraceptives Failed - 09/16/2018  3:40 PM      Failed - Last BP in normal range    BP Readings from Last 1 Encounters:  05/29/18 (!) 138/92         Passed - Valid encounter within last 12 months    Recent Outpatient Visits          3 months ago Acute pyelonephritis   [facility], Revonda Standard, MD   4 months ago Intractable vomiting with nausea, unspecified vomiting type   [place], Revonda Standard, MD   6 months ago Cervical cancer screening   [facility], Revonda Standard, MD   7 months ago Encounter for screening for HIV   [facility], Revonda Standard, MD            potassium chloride SA (K-DUR,KLOR-CON) 20 MEQ tablet 30 tablet 3    Sig: Take 1 tablet (20 mEq total) by mouth daily.     Endocrinology:  Minerals - Potassium Supplementation Passed - 09/16/2018  3:40 PM      Passed - K in normal range and within 360 days    Potassium  Date Value Ref Range Status  05/29/2018 3.7 3.5 - 5.1 mEq/L Final         Passed - Cr in normal range and within 360 days    Creat  Date Value Ref Range Status  03/03/2018 0.78 0.50 - 1.10 mg/dL Final   Creatinine, Ser  Date Value Ref Range Status  05/29/2018 0.44 0.40 - 1.20 mg/dL Final          Passed - Valid encounter within last 12 months    Recent Outpatient Visits          3 months ago Acute pyelonephritis   [facility], Revonda Standard, MD   4 months ago Intractable vomiting with nausea, unspecified vomiting type   [facility] Re[facility], MD   6 months ago Cervical cancer screening   [place], Revonda Standard, MD   7 months ago Encounter for screening for HIV   [facility] Re[facility], MD              Requested Prescriptions  Pending Prescriptions Disp Refills   norethindrone-ethinyl estradiol (JUNEL FE,GILDESS FE,LOESTRIN FE) 1-20 MG-MCG tablet 3 Package 2    Sig: Take 1 tablet by mouth daily.     OB/GYN:  Contraceptives Failed - 09/16/2018  3:40 PM      Failed - Last BP in normal range    BP Readings from Last 1 Encounters:  05/29/18 (!) 138/92         Passed - Valid encounter within last 12 months  Recent Outpatient Visits          3 months ago Acute pyelonephritis   [facility], Revonda Standard, MD   4 months ago Intractable vomiting with nausea, unspecified vomiting type   [facility], Revonda Standard, MD   6 months ago Cervical cancer screening   [facility], Revonda Standard, MD   7 months ago Encounter for screening for HIV   [place], Revonda Standard, MD            potassium chloride SA (K-DUR,KLOR-CON) 20 MEQ tablet 30 tablet 3    Sig: Take 1 tablet (20 mEq total) by mouth daily.     Endocrinology:  Minerals - Potassium Supplementation Passed - 09/16/2018  3:40 PM      Passed - K in normal range and within 360 days    Potassium  Date Value Ref Range Status  05/29/2018 3.7 3.5 - 5.1 mEq/L Final         Passed - Cr in normal range and within 360 days    Creat  Date Value Ref Range Status  03/03/2018 0.78 0.50 - 1.10 mg/dL Final    Creatinine, Ser  Date Value Ref Range Status  05/29/2018 0.44 0.40 - 1.20 mg/dL Final         Passed - Valid encounter within last 12 months    Recent Outpatient Visits          3 months ago Acute pyelonephritis   [facility], Revonda Standard, MD   4 months ago Intractable vomiting with nausea, unspecified vomiting type   [place] [facility] Re[facility], MD   6 months ago Cervical cancer screening   [facility] Re[facility], MD   7 months ago Encounter for screening for HIV   [facility], MD

## 2018-09-17 MED ORDER — NORETHIN ACE-ETH ESTRAD-FE 1-20 MG-MCG PO TABS: 1.0000 | ORAL_TABLET | Freq: Every day | ORAL | 2 refills | Status: DC

## 2018-09-17 MED ORDER — HYDROXYZINE HCL 25 MG PO TABS: ORAL_TABLET | ORAL | 3 refills | Status: DC

## 2018-09-17 NOTE — Telephone Encounter (Signed)
See note

## 2018-11-15 ENCOUNTER — Encounter (HOSPITAL_COMMUNITY): Payer: Self-pay

## 2018-11-15 ENCOUNTER — Emergency Department (HOSPITAL_COMMUNITY): Payer: 59

## 2018-11-15 ENCOUNTER — Other Ambulatory Visit: Payer: Self-pay

## 2018-11-15 ENCOUNTER — Emergency Department (HOSPITAL_COMMUNITY)
Admission: EM | Admit: 2018-11-15 | Discharge: 2018-11-15 | Disposition: A | Discharge status: Home / Self Care | Payer: 59 | Attending: Emergency Medicine | Admitting: Emergency Medicine

## 2018-11-15 DIAGNOSIS — Z87891 Personal history of nicotine dependence: Secondary | ICD-10-CM | POA: Diagnosis not present

## 2018-11-15 DIAGNOSIS — R0602 Shortness of breath: Secondary | ICD-10-CM | POA: Diagnosis present

## 2018-11-15 DIAGNOSIS — J45909 Unspecified asthma, uncomplicated: Secondary | ICD-10-CM | POA: Insufficient documentation

## 2018-11-15 DIAGNOSIS — Z79899 Other long term (current) drug therapy: Secondary | ICD-10-CM | POA: Insufficient documentation

## 2018-11-15 DIAGNOSIS — R072 Precordial pain: Secondary | ICD-10-CM | POA: Insufficient documentation

## 2018-11-15 DIAGNOSIS — E876 Hypokalemia: Secondary | ICD-10-CM

## 2018-11-15 LAB — CBC
HCT: 42.1 % (ref 36.0–46.0)
Hemoglobin: 14.4 g/dL (ref 12.0–15.0)
MCH: 36.5 pg — ABNORMAL HIGH (ref 26.0–34.0)
MCHC: 34.2 g/dL (ref 30.0–36.0)
MCV: 106.6 fL — ABNORMAL HIGH (ref 80.0–100.0)
Platelets: 234 10*3/uL (ref 150–400)
RBC: 3.95 MIL/uL (ref 3.87–5.11)
RDW: 13.9 % (ref 11.5–15.5)
WBC: 5.1 10*3/uL (ref 4.0–10.5)
nRBC: 0 % (ref 0.0–0.2)

## 2018-11-15 LAB — BASIC METABOLIC PANEL
Anion gap: 19 — ABNORMAL HIGH (ref 5–15)
BUN: 5 mg/dL — ABNORMAL LOW (ref 6–20)
CO2: 25 mmol/L (ref 22–32)
Calcium: 9.6 mg/dL (ref 8.9–10.3)
Chloride: 94 mmol/L — ABNORMAL LOW (ref 98–111)
Creatinine, Ser: 0.61 mg/dL (ref 0.44–1.00)
GFR calc Af Amer: >60 mL/min (ref >60)
GFR calc non Af Amer: >60 mL/min (ref >60)
Glucose, Bld: 98 mg/dL (ref 70–99)
Potassium: 2.6 mmol/L — CL (ref 3.5–5.1)
Sodium: 138 mmol/L (ref 135–145)

## 2018-11-15 LAB — D-DIMER, QUANTITATIVE (NOT AT ARMC): D-Dimer, Quant: 0.27 ug/mL-FEU (ref 0.00–0.50)

## 2018-11-15 MED ORDER — LORAZEPAM 1 MG PO TABS
1.0000 mg | ORAL_TABLET | Freq: Once | ORAL | Status: AC
  Administered 2018-11-15: 1 mg via ORAL
  Filled 2018-11-15: qty 1

## 2018-11-15 MED ORDER — KETOROLAC TROMETHAMINE 15 MG/ML IJ SOLN
15.0000 mg | Freq: Once | INTRAMUSCULAR | Status: AC
  Administered 2018-11-15: 15 mg via INTRAVENOUS
  Filled 2018-11-15: qty 1

## 2018-11-15 MED ORDER — POTASSIUM CHLORIDE 10 MEQ/100ML IV SOLN
10.0000 meq | Freq: Once | INTRAVENOUS | Status: AC
  Administered 2018-11-15: 10 meq via INTRAVENOUS
  Filled 2018-11-15: qty 100

## 2018-11-15 MED ORDER — POTASSIUM CHLORIDE CRYS ER 20 MEQ PO TBCR
40.0000 meq | EXTENDED_RELEASE_TABLET | Freq: Once | ORAL | Status: AC
  Administered 2018-11-15: 40 meq via ORAL
  Filled 2018-11-15: qty 2

## 2018-11-15 MED ORDER — LORAZEPAM 2 MG/ML IJ SOLN
0.5000 mg | Freq: Once | INTRAMUSCULAR | Status: AC
  Administered 2018-11-15: 0.5 mg via INTRAVENOUS
  Filled 2018-11-15: qty 1

## 2018-11-15 MED ORDER — HYDROXYZINE HCL 25 MG PO TABS
25.0000 mg | ORAL_TABLET | Freq: Once | ORAL | Status: AC
  Administered 2018-11-15: 25 mg via ORAL
  Filled 2018-11-15: qty 1

## 2018-11-15 MED ORDER — POTASSIUM CHLORIDE ER 10 MEQ PO TBCR: 10.0000 meq | EXTENDED_RELEASE_TABLET | Freq: Two times a day (BID) | ORAL | 0 refills | Status: DC

## 2018-11-15 NOTE — ED Provider Notes (Signed)
[facility]-EMERGENCY DEPT Provider Note   CSN: [id number] Arrival date & time: 11/15/18  0806    History   Chief Complaint Chief Complaint  Patient presents with  . Anxiety    HPI Natasha Pope is a 23 y.o. female.     HPI Patient is a 23 year old female who presents to the emergency department with complaints of shortness of breath this morning since approximately 5 AM.  Is been persistent.  She feels anxious.  She has a history of anxiety but reports she has not had a panic attack in some period of time.  She denies a history of DVT or pulmonary embolism.  No family history of venous thromboembolic disease.  She feels more comfortable sitting forward.  Some pleuritic anterior chest pain.  No radiation.  Past Medical History:  Diagnosis Date  . Asthma   . Pyelonephritis   . Sepsis [facility])     Patient Active Problem List   Diagnosis Date Noted  . Acute pyelonephritis 05/19/2018  . Sepsis (HCC) 05/19/2018  . Hypokalemia 05/19/2018  . Hypocalcemia 05/19/2018  . Hypomagnesemia 05/19/2018  . AKI (acute kidney injury) (HCC) 05/19/2018  . Macrocytic anemia 05/19/2018  . Electrolyte disturbance 05/19/2018  . Thrombocytopenia (HCC) 05/19/2018  . Alcohol use 05/19/2018  . ASCUS of cervix with negative high risk HPV 03/07/2018  . Tobacco abuse 01/31/2018  . Asthma 01/31/2018  . GAD (generalized anxiety disorder) 01/31/2018    Past Surgical History:  Procedure Laterality Date  . dental procedure       OB History   No obstetric history on file.      Home Medications    Prior to Admission medications   Medication Sig Start Date End Date Taking? Authorizing Provider  hydrOXYzine (ATARAX/VISTARIL) 25 MG tablet Take 1/2-1 tablet up to three times a day as needed for anxiety 09/17/18  Yes Orland Mustard, MD  Lactobacillus Rhamnosus, GG, (CULTURELLE PO) Take 1 tablet by mouth daily.   Yes [provider]  norethindrone-ethinyl  estradiol (JUNEL FE,GILDESS FE,LOESTRIN FE) 1-20 MG-MCG tablet Take 1 tablet by mouth daily. 09/17/18  Yes Orland Mustard, MD  potassium chloride SA (K-DUR,KLOR-CON) 20 MEQ tablet Take 1 tablet (20 mEq total) by mouth daily. 05/29/18  Yes Orland Mustard, MD    Family History Family History  Problem Relation Age of Onset  . Depression Mother   . Miscarriages / [place] Mother   . Hypertension Father   . Alcohol abuse Father   . Learning disabilities Sister   . Hyperlipidemia Maternal Grandmother   . Alcohol abuse Maternal Grandfather   . Alcohol abuse Paternal Grandmother   . Hypertension Paternal Grandfather     Social History Social History   Tobacco Use  . Smoking status: Former Smoker    Packs/day: 0.50    Types: Cigarettes    Last attempt to quit: 05/10/2018    Years since quitting: 0.5  . Smokeless tobacco: Never Used  Substance Use Topics  . Alcohol use: Yes    Comment: 2-3 days/week  . Drug use: No     Allergies   Patient has no known allergies.   Review of Systems Review of Systems  All other systems reviewed and are negative.    Physical Exam Updated Vital Signs BP (!) 140/108   Pulse 94   Temp 98.6 F (37 C) (Oral)   Resp 15   LMP 11/01/2018 (Approximate)   SpO2 100%   Physical Exam Vitals signs and nursing note reviewed.  Constitutional:      General: She is not in acute distress.    Appearance: She is well-developed.  HENT:     Head: Normocephalic and atraumatic.  Neck:     Musculoskeletal: Normal range of motion.  Cardiovascular:     Rate and Rhythm: Normal rate and regular rhythm.     Heart sounds: Normal heart sounds.  Pulmonary:     Effort: Pulmonary effort is normal.     Breath sounds: Normal breath sounds.  Abdominal:     General: There is no distension.     Palpations: Abdomen is soft.     Tenderness: There is no abdominal tenderness.  Musculoskeletal: Normal range of motion.  Skin:    General: Skin is warm and dry.   Neurological:     Mental Status: She is alert and oriented to person, place, and time.  Psychiatric:        Judgment: Judgment normal.      ED Treatments / Results  Labs (all labs ordered are listed, but only abnormal results are displayed) Labs Reviewed  CBC - Abnormal; Notable for the following components:      Result Value   MCV 106.6 (*)    MCH 36.5 (*)    All other components within normal limits  BASIC METABOLIC PANEL - Abnormal; Notable for the following components:   Potassium 2.6 (*)    Chloride 94 (*)    BUN 5 (*)    Anion gap 19 (*)    All other components within normal limits  D-DIMER, QUANTITATIVE (NOT AT [facility])    EKG EKG Interpretation  Date/Time:  Saturday November 15 2018 09:42:31 EDT Ventricular Rate:  74 PR Interval:    QRS Duration: 94 QT Interval:  464 QTC Calculation: 515 R Axis:   70 Text Interpretation:  Sinus or ectopic atrial rhythm Nonspecific T abnrm, anterolateral leads Prolonged QT interval No significant change was found Confirmed by Azalia Bilisampos, Kalista Laguardia ([id number]) on 11/15/2018 10:03:47 AM   Radiology Dg Chest 2 View  Result Date: 11/15/2018 CLINICAL DATA:  Shortness of breath. EXAM: CHEST - 2 VIEW COMPARISON:  None. FINDINGS: The heart size and mediastinal contours are within normal limits. Both lungs are clear. The visualized skeletal structures are unremarkable. IMPRESSION: No active cardiopulmonary disease. Electronically Signed   By: Gerome Samavid  Williams III M.D   On: 11/15/2018 08:59    Procedures Procedures (including critical care time)  Medications Ordered in ED Medications  LORazepam (ATIVAN) tablet 1 mg (has no administration in time range)  ketorolac (TORADOL) 15 MG/ML injection 15 mg (15 mg Intravenous Given 11/15/18 0909)  LORazepam (ATIVAN) injection 0.5 mg (0.5 mg Intravenous Given 11/15/18 0910)  potassium chloride 10 mEq in 100 mL IVPB (0 mEq Intravenous Stopped 11/15/18 1214)  potassium chloride SA (K-DUR) CR tablet 40 mEq (40 mEq  Oral Given 11/15/18 1053)  hydrOXYzine (ATARAX/VISTARIL) tablet 25 mg (25 mg Oral Given 11/15/18 1153)     Initial Impression / Assessment and Plan / ED Course  I have reviewed the triage vital signs and the nursing notes.  Pertinent labs & imaging results that were available during my care of the patient were reviewed by me and considered in my medical decision making (see chart for details).        Work-up in the emergency department without significant abnormality.  Patient seems anxious.  Vistaril and Ativan given here in the emergency department.  Discharged home in good condition.  Could represent gastroesophageal reflux disease.  Will  recommend Maalox or PPI.  Also found to be hypokalemic.  Potassium replaced here in the emergency department.  Home with several doses of supplemental potassium and encouraged to eat potassium rich foods.  Final Clinical Impressions(s) / ED Diagnoses   Final diagnoses:  Precordial chest pain    ED Discharge Orders    None       Azalia Bilis, MD 11/15/18 1242

## 2018-11-15 NOTE — ED Notes (Signed)
Bed: WI20 Expected date: 11/15/18 Expected time: 8:09 AM Means of arrival: Ambulance Comments: 23 yo anxiety-cough

## 2018-11-15 NOTE — ED Triage Notes (Signed)
She reports feeling "short of breath this morning", which began at ~0500 and persists. She states she vomited x 1. sehedenies fever, nor any other sign of current illness. She tells me that she was hospitalized "a few months ago for sepsis. The told me it came from a kidney infection". She is in no distress.

## 2018-11-15 NOTE — ED Notes (Signed)
Date and time results received: 11/15/18 1000   Test: K+ Critical Value: 2.6  Name of Provider Notified: Patria Mane  Orders Received? Or Actions Taken?:

## 2018-11-17 ENCOUNTER — Ambulatory Visit (INDEPENDENT_AMBULATORY_CARE_PROVIDER_SITE_OTHER): Payer: 59 | Admitting: Family Medicine

## 2018-11-17 ENCOUNTER — Encounter: Payer: Self-pay | Admitting: Family Medicine

## 2018-11-17 VITALS — Temp 98.6°F

## 2018-11-17 DIAGNOSIS — F411 Generalized anxiety disorder: Secondary | ICD-10-CM | POA: Diagnosis not present

## 2018-11-17 DIAGNOSIS — Z7289 Other problems related to lifestyle: Secondary | ICD-10-CM | POA: Diagnosis not present

## 2018-11-17 DIAGNOSIS — Z789 Other specified health status: Secondary | ICD-10-CM

## 2018-11-17 DIAGNOSIS — E876 Hypokalemia: Secondary | ICD-10-CM

## 2018-11-17 DIAGNOSIS — J029 Acute pharyngitis, unspecified: Secondary | ICD-10-CM

## 2018-11-17 MED ORDER — FLUOXETINE HCL 20 MG PO TABS: 20.0000 mg | ORAL_TABLET | Freq: Every day | ORAL | 1 refills | Status: DC

## 2018-11-17 MED ORDER — AMOXICILLIN 875 MG PO TABS: 875.0000 mg | ORAL_TABLET | Freq: Two times a day (BID) | ORAL | 0 refills | Status: DC

## 2018-11-17 NOTE — Progress Notes (Signed)
Patient: Natasha Pope MRN: [id number] DOB: 01/04/1996 PCP: Orland MustardWolfe, Fidelis Loth, MD     I connected with [name] on 11/17/18 at 8:40am by a video enabled telemedicine application and verified that I am speaking with the correct person using two identifiers.  Location patient: Home Location provider: [facility], Office Persons participating in this virtual visit: [name] and Dr. Artis FlockWolfe   I discussed the limitations of evaluation and management by telemedicine and the availability of in person appointments. The patient expressed understanding and agreed to proceed.   Subjective:  Chief Complaint  Patient presents with  . Anxiety  . Sore Throat  . Alcohol Intoxication    HPI: The patient is a 23 y.o. female who presents today for ER follow up over the weekend for anxiety. She presented to ER with complaints of shortness of breath that had been persistent for most of the day. She was also anxious. EKG showed sinus rhythm with some prolonged QT in anterior lead. No significant change was found compared to previous ekg. CXR normal. Recommended GERD treatment. Lab work found her to be hypokalemic. D-dimer was negative. Cbc was normal except for microcytosis, which was at her baseline and secondary to excessive alcohol intake. Likely cause of her hypokalemia. Potassium was replaced.   Anxiety: She states she is taking the hydroxyzine twice a day, but told the nurse she is not taking it all. She is not working. Her grandmother is supporting her as she does nothing. She is still drinking excessive amounts of alcohol. After asking her directly she does tell me she thinks she needs daily medication.   Hypokalemia: states she is taking the daily supplement I gave her (20meq); however, I do not know if this is true as all of her medications have been stated as not taking and potassium was still quite low.   She also has new symptoms of sore throat, ear ache that started  yesterday. Her grandmother thinks she can see white stuff on the back of her throat. They think her glands are swollen, but are not even touching in the right place. It hurts to swallow and occasionally she has to clear her throat as it feels like something is in it. No cough, fever, chills. No known strep contacts.   Alcohol abuse: still drinking excessively as evidenced by her labs. Tells me she is taking her folic acid and thiamine, but I do not believe this. She has not followed up like she should. Does admit to drinking as she has denied in the past. States she doesn't drink daily, but every other day and has 2-3+ glasses of wine/beer.   Review of Systems  Constitutional: Negative for chills and fever.  HENT: Positive for ear pain and sore throat. Negative for congestion, sinus pressure, sinus pain and trouble swallowing.   Eyes: Negative for visual disturbance.  Respiratory: Negative for cough, shortness of breath and wheezing.   Cardiovascular: Negative for chest pain and palpitations.  Gastrointestinal: Negative for abdominal pain, diarrhea, nausea and vomiting.  Neurological: Negative for light-headedness and headaches.    Allergies Patient has No Known Allergies.  Past Medical History Patient  has a past medical history of Asthma, Pyelonephritis, and Sepsis (HCC).  Surgical History Patient  has a past surgical history that includes dental procedure.  Family History [name]'s family history includes Alcohol abuse in her father, maternal grandfather, and paternal grandmother; Depression in her mother; Hyperlipidemia in her maternal grandmother; Hypertension in her father and paternal grandfather; Learning  disabilities in her sister; Miscarriages / Stillbirths in her mother.  Social History Patient  reports that she quit smoking about 6 months ago. Her smoking use included cigarettes. She smoked 0.50 packs per day. She has never used smokeless tobacco. She reports current alcohol  use. She reports that she does not use drugs.    Objective: Vitals:   11/17/18 0920  Temp: 98.6 F (37 C)  TempSrc: Oral    There is no height or weight on file to calculate BMI.  Physical Exam Constitutional:      Appearance: She is well-developed.  HENT:     Head: Normocephalic and atraumatic.  Pulmonary:     Effort: Pulmonary effort is normal. No respiratory distress.  Neurological:     General: No focal deficit present.     Mental Status: She is alert and oriented to person, place, and time.  Psychiatric:        Mood and Affect: Mood normal.        Behavior: Behavior normal.        GAD 7 : Generalized Anxiety Score 11/17/2018 03/03/2018 01/31/2018  Nervous, Anxious, on Edge 2 1 2   Control/stop worrying 0 0 1  Worry too much - different things 0 0 1  Trouble relaxing 1 0 1  Restless 1 0 2  Easily annoyed or irritable 1 1 1   Afraid - awful might happen 0 0 1  Total GAD 7 Score 5 2 9   Anxiety Difficulty - Somewhat difficult Somewhat difficult     Assessment/plan: 1. GAD (generalized anxiety disorder) Starting her on daily prozac. Discussed how to take this and side effects. Can continue hydroxyzine, but on more prn basis. Again, she needs to stop drinking and has so many social issues that is likely contributing to this. Would love to get her into counseling as well. Will see her back in 1 month for f/u. Any si/hi she is to call 911 or go to ER.   2. Hypokalemia On daily replacement. States she is taking, but I doubt this. likely secondary to renal wasting from drinking. On . F/u in 1 month for labs to make sure I don't need to increase this further, but compliance is an issue.   3. Alcohol use Instructed her to make sure she is taking her folic acid and thiamine. Again encouraged her to stop drinking. F/u in 1 month in office for labs.   4. Sore throat Treating for strep with 10 day course of amoxicillin. Instructed to use ibuprofen prn for pain, warm salt  water gurgles and throw away tooth brush after 24 hours. Let me know if not getting better and precautions given.    Return in about 1 month (around 12/17/2018) for in office for labs/anxiety .    Orland Mustard, MD [facility]  11/17/2018

## 2018-11-17 NOTE — Progress Notes (Deleted)
Acute Office Visit  Subjective:    Patient ID: Natasha Pope, female    DOB: May 03, 1996, 23 y.o.   MRN: [id number]  No chief complaint on file.   HPI Patient is in today for ***  Past Medical History:  Diagnosis Date  . Asthma   . Pyelonephritis   . Sepsis [facility])     Past Surgical History:  Procedure Laterality Date  . dental procedure      Family History  Problem Relation Age of Onset  . Depression Mother   . Miscarriages / [place] Mother   . Hypertension Father   . Alcohol abuse Father   . Learning disabilities Sister   . Hyperlipidemia Maternal Grandmother   . Alcohol abuse Maternal Grandfather   . Alcohol abuse Paternal Grandmother   . Hypertension Paternal Grandfather     Social History   Socioeconomic History  . Marital status: Single    Spouse name: Not on file  . Number of children: Not on file  . Years of education: Not on file  . Highest education level: Not on file  Occupational History  . Not on file  Social Needs  . Financial resource strain: Not hard at all  . Food insecurity:    Worry: Never true    Inability: Never true  . Transportation needs:    Medical: No    Non-medical: No  Tobacco Use  . Smoking status: Former Smoker    Packs/day: 0.50    Types: Cigarettes    Last attempt to quit: 05/10/2018    Years since quitting: 0.5  . Smokeless tobacco: Never Used  Substance and Sexual Activity  . Alcohol use: Yes    Comment: 2-3 days/week  . Drug use: No  . Sexual activity: Not on file  Lifestyle  . Physical activity:    Days per week: 7 days    Minutes per session: 70 min  . Stress: Rather much  Relationships  . Social connections:    Talks on phone: More than three times a week    Gets together: Once a week    Attends religious service: Never    Active member of club or organization: No    Attends meetings of clubs or organizations: Never    Relationship status: Never married  . Intimate partner violence:   Fear of current or ex partner: No    Emotionally abused: No    Physically abused: No    Forced sexual activity: No  Other Topics Concern  . Not on file  Social History Narrative  . Not on file    Outpatient Medications Prior to Visit  Medication Sig Dispense Refill  . hydrOXYzine (ATARAX/VISTARIL) 25 MG tablet Take 1/2-1 tablet up to three times a day as needed for anxiety 60 tablet 3  . Lactobacillus Rhamnosus, GG, (CULTURELLE PO) Take 1 tablet by mouth daily.    . norethindrone-ethinyl estradiol (JUNEL FE,GILDESS FE,LOESTRIN FE) 1-20 MG-MCG tablet Take 1 tablet by mouth daily. 3 Package 2  . potassium chloride (K-DUR) 10 MEQ tablet Take 1 tablet (10 mEq total) by mouth 2 (two) times daily. 8 tablet 0  . potassium chloride SA (K-DUR,KLOR-CON) 20 MEQ tablet Take 1 tablet (20 mEq total) by mouth daily. 30 tablet 3   No facility-administered medications prior to visit.     No Known Allergies  ROS     Objective:    Physical Exam  LMP 11/01/2018 (Approximate)  Wt Readings from Last 3 Encounters:  05/29/18  170 lb 3.2 oz (77.2 kg)  05/19/18 156 lb 1.6 oz (70.8 kg)  03/03/18 149 lb 3.2 oz (67.7 kg)    Health Maintenance Due  Topic Date Due  . TETANUS/TDAP  03/08/2018    There are no preventive care reminders to display for this patient.   Lab Results  Component Value Date   TSH 2.60 01/31/2018   Lab Results  Component Value Date   WBC 5.1 11/15/2018   HGB 14.4 11/15/2018   HCT 42.1 11/15/2018   MCV 106.6 (H) 11/15/2018   PLT 234 11/15/2018   Lab Results  Component Value Date   NA 138 11/15/2018   K 2.6 (LL) 11/15/2018   CO2 25 11/15/2018   GLUCOSE 98 11/15/2018   BUN 5 (L) 11/15/2018   CREATININE 0.61 11/15/2018   BILITOT 0.9 05/29/2018   ALKPHOS 84 05/29/2018   AST 21 05/29/2018   ALT 20 05/29/2018   PROT 5.4 (L) 05/29/2018   ALBUMIN 3.0 (L) 05/29/2018   CALCIUM 9.6 11/15/2018   ANIONGAP 19 (H) 11/15/2018   GFR 190.92 05/29/2018   Lab Results   Component Value Date   CHOL 164 01/31/2018   Lab Results  Component Value Date   HDL 81.40 01/31/2018   Lab Results  Component Value Date   LDLCALC 58 01/31/2018   Lab Results  Component Value Date   TRIG 122.0 01/31/2018   Lab Results  Component Value Date   CHOLHDL 2 01/31/2018   No results found for: HGBA1C     Assessment & Plan:   Problem List Items Addressed This Visit    None       No orders of the defined types were placed in this encounter.    Erick AlleyKhamika Edvin Albus, CMA

## 2018-11-27 ENCOUNTER — Ambulatory Visit: Payer: Self-pay

## 2018-11-27 ENCOUNTER — Other Ambulatory Visit: Payer: Self-pay | Admitting: Family Medicine

## 2018-11-27 NOTE — Telephone Encounter (Signed)
Pt called asking if it was ok to take the atarax and the Prozac together. Advised pt to continue taking her Prozac every morning as ordered and to take the atarax as prescribed.  Advised pt to take the Atarax when first feeling or having signs of anxiety.  Pt verbalized understanding.

## 2018-12-04 ENCOUNTER — Telehealth: Payer: Self-pay

## 2018-12-04 NOTE — Telephone Encounter (Signed)
Erroneous encounter-disregard

## 2018-12-04 NOTE — Telephone Encounter (Signed)
Patient returning my call from 1 week ago.  States that she had already spoken w/triage nurse who answered her question about whether she could take her Hydroxyzine along with her Prozac.  Patient states still having issues w/continued anxiety and feels that medication (Prozac) needs to be adjusted.  Advised her that Dr. Artis Flock could make that determination of adjusting the dosage.

## 2018-12-08 ENCOUNTER — Other Ambulatory Visit: Payer: Self-pay

## 2018-12-08 ENCOUNTER — Telehealth: Payer: Self-pay | Admitting: Family Medicine

## 2018-12-08 ENCOUNTER — Encounter (HOSPITAL_COMMUNITY): Payer: Self-pay | Admitting: Emergency Medicine

## 2018-12-08 ENCOUNTER — Emergency Department (HOSPITAL_COMMUNITY)
Admission: EM | Admit: 2018-12-08 | Discharge: 2018-12-09 | Disposition: A | Discharge status: Home / Self Care | Payer: 59 | Attending: Emergency Medicine | Admitting: Emergency Medicine

## 2018-12-08 DIAGNOSIS — R45851 Suicidal ideations: Secondary | ICD-10-CM | POA: Diagnosis not present

## 2018-12-08 DIAGNOSIS — Z1159 Encounter for screening for other viral diseases: Secondary | ICD-10-CM | POA: Diagnosis not present

## 2018-12-08 DIAGNOSIS — J45909 Unspecified asthma, uncomplicated: Secondary | ICD-10-CM | POA: Insufficient documentation

## 2018-12-08 DIAGNOSIS — R064 Hyperventilation: Secondary | ICD-10-CM | POA: Diagnosis not present

## 2018-12-08 DIAGNOSIS — F1994 Other psychoactive substance use, unspecified with psychoactive substance-induced mood disorder: Secondary | ICD-10-CM | POA: Diagnosis present

## 2018-12-08 DIAGNOSIS — F329 Major depressive disorder, single episode, unspecified: Secondary | ICD-10-CM | POA: Diagnosis not present

## 2018-12-08 DIAGNOSIS — Y908 Blood alcohol level of 240 mg/100 ml or more: Secondary | ICD-10-CM | POA: Insufficient documentation

## 2018-12-08 DIAGNOSIS — F1010 Alcohol abuse, uncomplicated: Secondary | ICD-10-CM | POA: Diagnosis present

## 2018-12-08 DIAGNOSIS — Z87891 Personal history of nicotine dependence: Secondary | ICD-10-CM | POA: Diagnosis not present

## 2018-12-08 DIAGNOSIS — Z7289 Other problems related to lifestyle: Secondary | ICD-10-CM | POA: Diagnosis not present

## 2018-12-08 DIAGNOSIS — F419 Anxiety disorder, unspecified: Secondary | ICD-10-CM | POA: Insufficient documentation

## 2018-12-08 DIAGNOSIS — Z046 Encounter for general psychiatric examination, requested by authority: Secondary | ICD-10-CM | POA: Diagnosis not present

## 2018-12-08 DIAGNOSIS — Z79899 Other long term (current) drug therapy: Secondary | ICD-10-CM | POA: Insufficient documentation

## 2018-12-08 LAB — COMPREHENSIVE METABOLIC PANEL
ALT: 75 U/L — ABNORMAL HIGH (ref 0–44)
AST: 198 U/L — ABNORMAL HIGH (ref 15–41)
Albumin: 4.4 g/dL (ref 3.5–5.0)
Alkaline Phosphatase: 94 U/L (ref 38–126)
Anion gap: 17 — ABNORMAL HIGH (ref 5–15)
BUN: <5 mg/dL — ABNORMAL LOW (ref 6–20)
CO2: 20 mmol/L — ABNORMAL LOW (ref 22–32)
Calcium: 9 mg/dL (ref 8.9–10.3)
Chloride: 105 mmol/L (ref 98–111)
Creatinine, Ser: 0.63 mg/dL (ref 0.44–1.00)
GFR calc Af Amer: >60 mL/min (ref >60)
GFR calc non Af Amer: >60 mL/min (ref >60)
Glucose, Bld: 121 mg/dL — ABNORMAL HIGH (ref 70–99)
Potassium: 3.4 mmol/L — ABNORMAL LOW (ref 3.5–5.1)
Sodium: 142 mmol/L (ref 135–145)
Total Bilirubin: 0.7 mg/dL (ref 0.3–1.2)
Total Protein: 7.8 g/dL (ref 6.5–8.1)

## 2018-12-08 LAB — CBC
HCT: 43.3 % (ref 36.0–46.0)
Hemoglobin: 14.7 g/dL (ref 12.0–15.0)
MCH: 35 pg — ABNORMAL HIGH (ref 26.0–34.0)
MCHC: 33.9 g/dL (ref 30.0–36.0)
MCV: 103.1 fL — ABNORMAL HIGH (ref 80.0–100.0)
Platelets: 241 10*3/uL (ref 150–400)
RBC: 4.2 MIL/uL (ref 3.87–5.11)
RDW: 14.3 % (ref 11.5–15.5)
WBC: 7.3 10*3/uL (ref 4.0–10.5)
nRBC: 0 % (ref 0.0–0.2)

## 2018-12-08 LAB — I-STAT BETA HCG BLOOD, ED (MC, WL, AP ONLY): I-stat hCG, quantitative: <5 m[IU]/mL (ref <5)

## 2018-12-08 LAB — SALICYLATE LEVEL: Salicylate Lvl: <7 mg/dL (ref 2.8–30.0)

## 2018-12-08 LAB — ETHANOL: Alcohol, Ethyl (B): 411 mg/dL (ref <10)

## 2018-12-08 LAB — ACETAMINOPHEN LEVEL: Acetaminophen (Tylenol), Serum: <10 ug/mL — ABNORMAL LOW (ref 10–30)

## 2018-12-08 MED ORDER — ONDANSETRON HCL 4 MG PO TABS: 4.0000 mg | ORAL_TABLET | Freq: Three times a day (TID) | ORAL | Status: DC | PRN

## 2018-12-08 MED ORDER — ZOLPIDEM TARTRATE 5 MG PO TABS
5.0000 mg | ORAL_TABLET | Freq: Every evening | ORAL | Status: DC | PRN
  Administered 2018-12-08: 5 mg via ORAL
  Filled 2018-12-08: qty 1

## 2018-12-08 MED ORDER — ALUM & MAG HYDROXIDE-SIMETH 200-200-20 MG/5ML PO SUSP: 30.0000 mL | Freq: Four times a day (QID) | ORAL | Status: DC | PRN

## 2018-12-08 MED ORDER — LORAZEPAM 2 MG/ML IJ SOLN
2.0000 mg | Freq: Once | INTRAMUSCULAR | Status: AC
  Administered 2018-12-08: 2 mg via INTRAMUSCULAR
  Filled 2018-12-08: qty 1

## 2018-12-08 MED ORDER — IBUPROFEN 200 MG PO TABS: 600.0000 mg | ORAL_TABLET | Freq: Three times a day (TID) | ORAL | Status: DC | PRN

## 2018-12-08 NOTE — ED Provider Notes (Addendum)
[facility]-EMERGENCY DEPT Provider Note   CSN: [id number] Arrival date & time: 12/08/18  2143    History   Chief Complaint Chief Complaint  Patient presents with   Panic Attack   Suicidal    HPI Natasha Pope is a 23 y.o. female.     The history is provided by the patient and medical records.    LEVEL V CAVEAT:  PSYCHIATRIC CONDITION  23 y.o. F with hx of asthma, anxiety, anemia, alcohol abuse, presenting to the ED for alleged anxiety.  The only question I can get patient to answer for me is that she does take meds for anxiety, but cannot tell me what they are or the last time she took them.  Patient is yelling and screaming "help me, help me, I'm going to die, I can't breathe".  She reported to RN that her medications at home are not working and she cannot tolerate this anymore.  She admitted that she has cut her hands and wants to hurt herself.  Past Medical History:  Diagnosis Date   Asthma    Pyelonephritis    Sepsis [facility])     Patient Active Problem List   Diagnosis Date Noted   Acute pyelonephritis 05/19/2018   Sepsis (HCC) 05/19/2018   Hypokalemia 05/19/2018   Hypocalcemia 05/19/2018   Hypomagnesemia 05/19/2018   AKI (acute kidney injury) (HCC) 05/19/2018   Macrocytic anemia 05/19/2018   Electrolyte disturbance 05/19/2018   Thrombocytopenia (HCC) 05/19/2018   Alcohol use 05/19/2018   ASCUS of cervix with negative high risk HPV 03/07/2018   Tobacco abuse 01/31/2018   Asthma 01/31/2018   GAD (generalized anxiety disorder) 01/31/2018    Past Surgical History:  Procedure Laterality Date   dental procedure       OB History   No obstetric history on file.      Home Medications    Prior to Admission medications   Medication Sig Start Date End Date Taking? Authorizing Provider  amoxicillin (AMOXIL) 875 MG tablet Take 1 tablet (875 mg total) by mouth 2 (two) times daily. 11/17/18   Orland Mustard, MD    FLUoxetine (PROZAC) 20 MG tablet Take 1 tablet (20 mg total) by mouth daily. 11/17/18   Orland Mustard, MD  hydrOXYzine (ATARAX/VISTARIL) 25 MG tablet take half to one tablet by mouth up to three times a day as needed for anxiety 11/27/18   Orland Mustard, MD  norethindrone-ethinyl estradiol (JUNEL FE,GILDESS FE,LOESTRIN FE) 1-20 MG-MCG tablet Take 1 tablet by mouth daily. 09/17/18   Orland Mustard, MD  potassium chloride (K-DUR) 10 MEQ tablet Take 1 tablet (10 mEq total) by mouth 2 (two) times daily. 11/15/18   Azalia Bilis, MD    Family History Family History  Problem Relation Age of Onset   Depression Mother    Miscarriages / [place] Mother    Hypertension Father    Alcohol abuse Father    Learning disabilities Sister    Hyperlipidemia Maternal Grandmother    Alcohol abuse Maternal Grandfather    Alcohol abuse Paternal Grandmother    Hypertension Paternal Grandfather     Social History Social History   Tobacco Use   Smoking status: Former Smoker    Packs/day: 0.50    Types: Cigarettes    Last attempt to quit: 05/10/2018    Years since quitting: 0.5   Smokeless tobacco: Never Used  Substance Use Topics   Alcohol use: Yes    Comment: 2-3 days/week   Drug use: No  Allergies   Patient has no known allergies.   Review of Systems Review of Systems  Unable to perform ROS: Psychiatric disorder     Physical Exam Updated Vital Signs BP (!) 157/130 (BP Location: Left Arm)    Temp 98.2 F (36.8 C) (Oral)    Resp (!) 30    Ht 5\' 7"  (1.702 m)    Wt 70 kg    SpO2 98%    BMI 24.17 kg/m   Physical Exam Vitals signs and nursing note reviewed.  Constitutional:      Appearance: She is well-developed.  HENT:     Head: Normocephalic and atraumatic.  Eyes:     Conjunctiva/sclera: Conjunctivae normal.     Pupils: Pupils are equal, round, and reactive to light.  Neck:     Musculoskeletal: Normal range of motion.  Cardiovascular:     Rate and Rhythm: Normal  rate and regular rhythm.     Heart sounds: Normal heart sounds.  Pulmonary:     Effort: Pulmonary effort is normal.     Breath sounds: Normal breath sounds.     Comments: Hyperventilating but lungs clear Abdominal:     General: Bowel sounds are normal.     Palpations: Abdomen is soft.  Musculoskeletal: Normal range of motion.  Skin:    General: Skin is warm and dry.  Neurological:     Mental Status: She is alert and oriented to person, place, and time.  Psychiatric:        Mood and Affect: Mood is anxious. Affect is tearful.     Comments: Anxious, intermittently tearful, pacing Yelling "help me, i'm going to die, I can't breathe" repeatedly, cannot redirect her attention on multiple attempts      ED Treatments / Results  Labs (all labs ordered are listed, but only abnormal results are displayed) Labs Reviewed  COMPREHENSIVE METABOLIC PANEL - Abnormal; Notable for the following components:      Result Value   Potassium 3.4 (*)    CO2 20 (*)    Glucose, Bld 121 (*)    BUN <5 (*)    AST 198 (*)    ALT 75 (*)    Anion gap 17 (*)    All other components within normal limits  ETHANOL - Abnormal; Notable for the following components:   Alcohol, Ethyl (B) 411 (*)    All other components within normal limits  ACETAMINOPHEN LEVEL - Abnormal; Notable for the following components:   Acetaminophen (Tylenol), Serum <10 (*)    All other components within normal limits  CBC - Abnormal; Notable for the following components:   MCV 103.1 (*)    MCH 35.0 (*)    All other components within normal limits  RAPID URINE DRUG SCREEN, HOSP PERFORMED - Abnormal; Notable for the following components:   Benzodiazepines POSITIVE (*)    All other components within normal limits  SARS CORONAVIRUS 2 (HOSPITAL ORDER, PERFORMED IN [facility])  SALICYLATE LEVEL  I-STAT BETA HCG BLOOD, ED ([facility], [facility], [facility] ONLY)    EKG None  Radiology No results found.  Procedures Procedures (including  critical care time)  Medications Ordered in ED Medications  alum & mag hydroxide-simeth (MAALOX/MYLANTA) 200-200-20 MG/5ML suspension 30 mL (has no administration in time range)  ondansetron (ZOFRAN) tablet 4 mg (has no administration in time range)  zolpidem (AMBIEN) tablet 5 mg (5 mg Oral Given 12/08/18 2345)  ibuprofen (ADVIL) tablet 600 mg (has no administration in time range)  LORazepam (  ATIVAN) injection 2 mg (2 mg Intramuscular Given 12/08/18 2220)     Initial Impression / Assessment and Plan / ED Course  I have reviewed the triage vital signs and the nursing notes.  Pertinent labs & imaging results that were available during my care of the patient were reviewed by me and considered in my medical decision making (see chart for details).  23 y.o. F here with anxiety and reported thoughts of self harm, specifically wanting to cut her hands.  I am not able to get much information from her, she continually is yelling "help me, i'm going to die, I can't breathe".  She is ambulatory, pacing intermittently and other times refusing to move.  She is not able to be redirected.  She was given 2mg  IM ativan which seemed to calm her significantly.  Labs are pending.  Labs as above-- ethanol 411.  AST/ALT elevated in a 2:1 ratio that is most consistent with alcohol abuse (patient has history of same).  Will get TTS evaluation.  TTS has evaluated--- recommends IP placement.  [facility] pending further sobriety here.  Patient will be monitored until stable for transfer.  [facility]  Final Clinical Impressions(s) / ED Diagnoses   Final diagnoses:  Suicidal ideation  Anxiety  Alcohol abuse    ED Discharge Orders    None       Garlon HatchetSanders, Dream Nodal M, PA-C 12/09/18 0500    Garlon HatchetSanders, Ryen Heitmeyer M, PA-C 12/09/18 Wilber Oliphant0533    Linwood DibblesKnapp, Jon, MD 12/11/18 [phone]

## 2018-12-08 NOTE — Telephone Encounter (Signed)
Please Advise. Copied from CRM [id number]. Topic: Appointment Scheduling - Scheduling Inquiry for Clinic >> Dec 08, 2018 11:56 AM Reggie Pile, NT wrote: Reason for CRM: Patient's grandmother is calling in wanting to schedule an appointment with [facility] for the patient in regards to alcohol abuse. Patient states she is in need of possible outpatient rehab and would like help. Call back number is [phone]. Patient's grandmother is on [place].

## 2018-12-08 NOTE — Telephone Encounter (Signed)
Spoke with patient's grandmother.  Appointment r/s from 5/27 to 5/21.  Advised of all notes/recommendations per Dr. Artis Flock.  Patient's grandmother verbalized understanding.

## 2018-12-08 NOTE — ED Triage Notes (Addendum)
Pt having panic attack at time of triage stating " I can't breathe" pt screaming and states that she has really bad anxiety and her medication is not working. Pt states she was at home when attack started.   During triage pt stated that she wanted to hurt herself. Pt reported to cutting her hands

## 2018-12-08 NOTE — Telephone Encounter (Signed)
Please let grandmother know we already have an appointment scheduled for her next week I believe. Jen-can we make this a 40 minute appointment? Also let grandmother know that any outpatient/inpatient rehab will be all done on their own. There is nothing I can do to make her go or set this up for her as it's all privately funded. The only thing we can do is counsel her in the appointment and give resources. Of course, if any physical symptoms, altered mental status, etc. Needs to go to ER.

## 2018-12-08 NOTE — ED Notes (Signed)
Unable to obtain an accurate blood pressure reading due to patient yelling and moving arms around. When writer attempted to obtain a second blood pressure, pt yelled "take it off now," referring to blood pressure cuff

## 2018-12-08 NOTE — ED Notes (Signed)
Bed: [facility] Expected date:  Expected time:  Means of arrival:  Comments: 

## 2018-12-08 NOTE — ED Notes (Signed)
Pt's grandmother left and has pt's cell phone with her. Pt's grandmother is the emergency contact for the pt and told staff she could be contacted anytime and would be only a few minutes away from the hospital. Pt's other belongings are at the nurses station labeled and in triage.

## 2018-12-08 NOTE — ED Notes (Addendum)
Pt crying hysterically in triage and yelling "I can't breathe".

## 2018-12-09 ENCOUNTER — Inpatient Hospital Stay (HOSPITAL_COMMUNITY): Admission: AD | Admit: 2018-12-09 | Payer: 59 | Source: Intra-hospital | Admitting: Psychiatry

## 2018-12-09 ENCOUNTER — Telehealth: Payer: Self-pay | Admitting: Family Medicine

## 2018-12-09 DIAGNOSIS — F1010 Alcohol abuse, uncomplicated: Secondary | ICD-10-CM | POA: Diagnosis present

## 2018-12-09 DIAGNOSIS — F1994 Other psychoactive substance use, unspecified with psychoactive substance-induced mood disorder: Secondary | ICD-10-CM | POA: Diagnosis present

## 2018-12-09 HISTORY — DX: Other psychoactive substance use, unspecified with psychoactive substance-induced mood disorder: F19.94

## 2018-12-09 LAB — RAPID URINE DRUG SCREEN, HOSP PERFORMED
Amphetamines: NOT DETECTED
Barbiturates: NOT DETECTED
Benzodiazepines: POSITIVE — AB
Cocaine: NOT DETECTED
Opiates: NOT DETECTED
Tetrahydrocannabinol: NOT DETECTED

## 2018-12-09 LAB — SARS CORONAVIRUS 2 BY RT PCR (HOSPITAL ORDER, PERFORMED IN ~~LOC~~ HOSPITAL LAB): SARS Coronavirus 2: NEGATIVE

## 2018-12-09 MED ORDER — LORAZEPAM 2 MG/ML IJ SOLN: 0.0000 mg | Freq: Two times a day (BID) | INTRAMUSCULAR | Status: DC

## 2018-12-09 MED ORDER — THIAMINE HCL 100 MG/ML IJ SOLN: 100.0000 mg | Freq: Every day | INTRAMUSCULAR | Status: DC

## 2018-12-09 MED ORDER — LORAZEPAM 2 MG/ML IJ SOLN: 0.0000 mg | Freq: Four times a day (QID) | INTRAMUSCULAR | Status: DC

## 2018-12-09 MED ORDER — LORAZEPAM 1 MG PO TABS
0.0000 mg | ORAL_TABLET | Freq: Four times a day (QID) | ORAL | Status: DC
  Administered 2018-12-09: 1 mg via ORAL
  Filled 2018-12-09: qty 1

## 2018-12-09 MED ORDER — LORAZEPAM 1 MG PO TABS: 0.0000 mg | ORAL_TABLET | Freq: Two times a day (BID) | ORAL | Status: DC

## 2018-12-09 MED ORDER — VITAMIN B-1 100 MG PO TABS: 100.0000 mg | ORAL_TABLET | Freq: Every day | ORAL | Status: DC

## 2018-12-09 NOTE — BH Assessment (Addendum)
[facility] Assessment Progress Note  Per Juanetta Beets, DO, after further examination, this pt does not require psychiatric hospitalization at this time.  Pt is to be discharged from [facility] [address] with referral information for substance abuse treatment providers in the community.  Discharge instructions include referral information for the Chemical Dependency Intensive Outpatient Program at the [facility] [place].  Pt is to follow up at her own initiative.  Pt would also benefit from seeing Peer Support Specialists; they will be asked to speak to pt.  Pt's nurse, Kendal Hymen, has been notified.  Doylene Canning, MA Triage Specialist [phone]
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
## 2018-12-09 NOTE — Discharge Instructions (Signed)
For your behavioral health needs, you are advised to follow up with the Chemical Dependency Intensive Outpatient Program (CD-IOP) at the [facility] [place].  This program meets Monday, Wednesday and Thursday from 1:00 pm - 4:00 pm.  For more details about the program, or to schedule an intake appointment call Janit Pagan, LCAS-A at your earliest opportunity:       [facility]. [employer]. [address] [place] [address]      Contact person: Janit Pagan, LCAS-A      [phone]

## 2018-12-09 NOTE — ED Notes (Signed)
Pt was discharged safely with grandmother.  Discharge instructions were reviewed.  Pt was instructed that peer support would reach out to her.  All belongings were returned to pt.

## 2018-12-09 NOTE — ED Notes (Signed)
[phone], OTHER [phone]

## 2018-12-09 NOTE — ED Notes (Signed)
TTS with Samantha in progress at present.

## 2018-12-09 NOTE — Telephone Encounter (Signed)
Copied from CRM [id number]. Topic: Referral - Request for Referral >> Dec 09, 2018  4:53 PM Maye Hides wrote: Has patient seen PCP for this complaint? No.Seen at ER [facility]) *If NO, is insurance requiring patient see PCP for this issue before PCP can refer them? Referral for which specialty:Psychiatry Preferred provider/office: [facility] out pt behavioral care at [address] [facility] in [place] Reason for referral: panic attacks

## 2018-12-09 NOTE — ED Notes (Signed)
Pt accepted to [facility] for admission at 9am per [facility].

## 2018-12-09 NOTE — BHH Suicide Risk Assessment (Signed)
Suicide Risk Assessment  Discharge Assessment   [facility] Dis[facility] Suicide Risk Assessment   Principal Problem: Substance induced mood disorder [facility]) Discharge Diagnoses: Principal Problem:   Substance induced mood disorder (HCC) Active Problems:   Alcohol abuse  Patient presents today in her bed and she is awake and alert and oriented.  Patient denies any suicidal or homicidal ideations and denies any hallucinations.  Patient reports that yesterday she was celebrating someone's birthday and she was over drinking.  She reports daily drinking for the last year and a half.  Patient states that she does want to quit drinking but she is not interested in coming to a detox bed or to an inpatient facility.  Per the notes the patient's grandmother has been trying to assist her by working with her PCP on getting her admitted to a detox facility and the patient is continued to refuse.  Patient states that she continues to take her Prozac and her Atarax to assist with her depression and her anxiety.  Patient reports that she does start drinking more because of her depression and anxiety but denies any active suicidal thoughts still.  Patient is given options to continue with inpatient treatment to assess her depression as well as her substance abuse but patient refuses.  Patient is then also offered to go to a detox bed and she is refuses it as well.  Patient does not meet the criteria for IVC.  Patient will be discharged with outpatient resources.  Total Time spent with patient: 30 minutes  Musculoskeletal: Strength & Muscle Tone: within normal limits Gait & Station: normal Patient leans: N/A  Psychiatric Specialty Exam:   Blood pressure (!) 146/109, pulse (!) 101, temperature 97.7 F (36.5 C), temperature source Oral, resp. rate 17, height 5\' 7"  (1.702 m), weight 70 kg, SpO2 99 %.Body mass index is 24.17 kg/m.  General Appearance: Disheveled  Eye Contact::  Good  Speech:  Clear and Coherent and Normal  Rate409  Volume:  Normal  Mood:  Euthymic  Affect:  Congruent  Thought Process:  Coherent and Descriptions of Associations: Intact  Orientation:  Full (Time, Place, and Person)  Thought Content:  WDL  Suicidal Thoughts:  No  Homicidal Thoughts:  No  Memory:  Immediate;   Good Recent;   Good Remote;   Good  Judgement:  Fair  Insight:  Lacking  Psychomotor Activity:  Normal  Concentration:  Good  Recall:  Good  Fund of Knowledge:Good  Language: Good  Akathisia:  No  Handed:  Right  AIMS (if indicated):     Assets:  Communication Skills Desire for Improvement Financial Resources/Insurance Housing Physical Health Social Support Transportation  Sleep:     Cognition: WNL  ADL's:  Intact   Mental Status Per Nursing Assessment::   On Admission:   12/09/18 [facility] TTS Assessment: 23 y.o. female who was brought to [facility] ED by her grandmother due to experiencing a panic attack. At the time pt was seen, pt was calm due to having been provided an Ativan to assist in her being able to relax. Pt shares she has been experiencing panic attacks for approximately one month and that, during these attacks, she feels as if she is unable to breathe. Clinician inquired as to whether pt has experienced SI previously or recently. Pt acknowledged she has had some SI but, when clinician asked about any attempts to kill herself, pt paused for a long while before shaking her head "no." Clinician questioned pt about this, stating  that she was unsure pt was being honest about this. Pt was silent and refused to answer clinician's questions. Clinician stated that she would assume that this was something that pt was having a hard time discussing and they could move on and pt agreed. Pt's grandmother shared that pt has made the comment, "I wish I could go to sleep and not wake up," numerous times, which has been of concern to her. Pt denied HI, access, to guns or weapons, and any use of substances. Clinician  inquired about the use of EtOH, as clinician had seen in pt's notes that pt was seeking treatment for EtOH abuse, and pt again denied any use of substances, including EtOH. Pt's grandmother expressed concerns about pt drinking too much, stating pt began drinking when her parents divorced two years ago. Pt identified she has been experiencing AVH, stating she experiences AH "all the time," though she can't identify what she hears, and states that at home she sees dark figures and "not nice things." Pt states she has a hx of engaging in NSSIB via cutting herself and that the last time she engaged in this behavior was years ago. She states she has a scheduled court date for her DWI on Dec 10, 2018 but that she plans to continue this.  Demographic Factors:  Adolescent or young adult  Loss Factors: NA  Historical Factors: Family history of mental illness or substance abuse  Risk Reduction Factors:   Living with another person, especially a relative, Positive social support and Positive therapeutic relationship  Continued Clinical Symptoms:  Depression:   Comorbid alcohol abuse/dependence Alcohol/Substance Abuse/Dependencies  Cognitive Features That Contribute To Risk:  Closed-mindedness    Suicide Risk:  Minimal: No identifiable suicidal ideation.  Patients presenting with no risk factors but with morbid ruminations; may be classified as minimal risk based on the severity of the depressive symptoms    Plan Of Care/Follow-up recommendations:  Continue activity as tolerated. Continue diet as recommended by your PCP. Ensure to keep all appointments with outpatient providers.Continue all home medications. Follow up with outpatient detox resources  Maryfrances Bunnellravis B Arvil Utz, FNP 12/09/2018, 10:21 AM

## 2018-12-09 NOTE — Progress Notes (Signed)
Pt accepted to  [facility], Bed 300-1 Denzil Magnuson, NP is the accepting provider.  Landry Mellow, MD is the attending provider.  Call report to [phone]  @ [email or web address] ED notified.   Pt is  Voluntary.  Pt may be transported by [facility]  Pt scheduled  to arrive at [facility] as soon as transport is available  Carney Bern T. Kaylyn Lim, MSW, LCSW Disposition Clinical Social Work [phone] (cell) [phone] (office)

## 2018-12-09 NOTE — ED Notes (Signed)
Pt accepted to [facility] rm 300-1 after 9 am.

## 2018-12-09 NOTE — ED Notes (Signed)
Date and time results received: 12/08/2018 23:50 (use smartphrase ".now" to insert current time)  Test: ETOH  Critical Value: 411  Name of Provider Notified: Misty Stanley, [place].   Orders Received? Or Actions Taken?: Continue to monitor patient and await new orders.

## 2018-12-09 NOTE — ED Notes (Signed)
Pt awake, alert & responsive, no distress noted, calm at present.  Presents with SI, plan to cut self, superficial marks noted to hands.  Pt initially distraught and anxious, given Ativan in triage.  Monitoring for safety, sitter at bedside.  Pt resting at present.

## 2018-12-10 ENCOUNTER — Other Ambulatory Visit: Payer: Self-pay

## 2018-12-10 ENCOUNTER — Encounter (HOSPITAL_COMMUNITY): Payer: Self-pay | Admitting: Emergency Medicine

## 2018-12-10 ENCOUNTER — Emergency Department (HOSPITAL_COMMUNITY)
Admission: EM | Admit: 2018-12-10 | Discharge: 2018-12-10 | Disposition: A | Discharge status: Home / Self Care | Payer: 59 | Attending: Emergency Medicine | Admitting: Emergency Medicine

## 2018-12-10 DIAGNOSIS — F410 Panic disorder [episodic paroxysmal anxiety] without agoraphobia: Secondary | ICD-10-CM | POA: Insufficient documentation

## 2018-12-10 DIAGNOSIS — R0602 Shortness of breath: Secondary | ICD-10-CM | POA: Diagnosis present

## 2018-12-10 DIAGNOSIS — J45909 Unspecified asthma, uncomplicated: Secondary | ICD-10-CM | POA: Insufficient documentation

## 2018-12-10 DIAGNOSIS — Z87891 Personal history of nicotine dependence: Secondary | ICD-10-CM | POA: Diagnosis not present

## 2018-12-10 DIAGNOSIS — Z79899 Other long term (current) drug therapy: Secondary | ICD-10-CM | POA: Insufficient documentation

## 2018-12-10 MED ORDER — LORAZEPAM 1 MG PO TABS
2.0000 mg | ORAL_TABLET | Freq: Once | ORAL | Status: AC
  Administered 2018-12-10: 06:00:00 2 mg via ORAL
  Filled 2018-12-10: qty 2

## 2018-12-10 MED ORDER — LORAZEPAM 1 MG PO TABS: 1.0000 mg | ORAL_TABLET | Freq: Three times a day (TID) | ORAL | 0 refills | Status: DC | PRN

## 2018-12-10 NOTE — Telephone Encounter (Signed)
Patient has appt in office on 5/21 w/Dr. Artis Flock.  Will address at that time.

## 2018-12-10 NOTE — ED Notes (Signed)
Bed: WLPT2 Expected date:  Expected time:  Means of arrival:  Comments: 

## 2018-12-10 NOTE — ED Provider Notes (Signed)
[facility]-EMERGENCY DEPT Provider Note   CSN: [id number] Arrival date & time: 12/10/18  0442    History   Chief Complaint Chief Complaint  Patient presents with  . Anxiety    HPI Natasha Pope is a 23 y.o. female.     Patient with a history of anxiety and panic presents after waking tonight around 3:00 am with symptoms of SOB, feeling that she was going to die, heart racing, restlessness and intense fear consistent with previous panic attacks. She was seen in the emergency department yesterday for same, evaluated and referred for outpatient counseling through [facility]. The patient states she and her grandmother have started the process for getting in to be seen and therapy started. She states she has been prescribed Prozac and hydroxyzine and has been taking it as prescribed without control of her symptoms. She is starting to remember things that happened in her childhood and she feels this is contributing to worsening symptoms. No SI/HI/AVH.   The history is provided by the patient. No language interpreter was used.  Anxiety  Associated symptoms include shortness of breath.    Past Medical History:  Diagnosis Date  . Asthma   . Pyelonephritis   . Sepsis [facility])     Patient Active Problem List   Diagnosis Date Noted  . Substance induced mood disorder (HCC) 12/09/2018  . Alcohol abuse 12/09/2018  . Acute pyelonephritis 05/19/2018  . Sepsis (HCC) 05/19/2018  . Hypokalemia 05/19/2018  . Hypocalcemia 05/19/2018  . Hypomagnesemia 05/19/2018  . AKI (acute kidney injury) (HCC) 05/19/2018  . Macrocytic anemia 05/19/2018  . Electrolyte disturbance 05/19/2018  . Thrombocytopenia (HCC) 05/19/2018  . Alcohol use 05/19/2018  . ASCUS of cervix with negative high risk HPV 03/07/2018  . Tobacco abuse 01/31/2018  . Asthma 01/31/2018  . GAD (generalized anxiety disorder) 01/31/2018    Past Surgical History:  Procedure Laterality Date  .  dental procedure       OB History   No obstetric history on file.      Home Medications    Prior to Admission medications   Medication Sig Start Date End Date Taking? Authorizing Provider  amoxicillin (AMOXIL) 875 MG tablet Take 1 tablet (875 mg total) by mouth 2 (two) times daily. Patient not taking: Reported on 12/09/2018 11/17/18   Orland Mustard, MD  FLUoxetine (PROZAC) 20 MG tablet Take 1 tablet (20 mg total) by mouth daily. 11/17/18   Orland Mustard, MD  hydrOXYzine (ATARAX/VISTARIL) 25 MG tablet take half to one tablet by mouth up to three times a day as needed for anxiety Patient taking differently: Take 12.5-25 mg by mouth 3 (three) times daily as needed for anxiety. take half to one tablet by mouth up to three times a day as needed for anxiety 11/27/18   Orland Mustard, MD  norethindrone-ethinyl estradiol (JUNEL FE,GILDESS FE,LOESTRIN FE) 1-20 MG-MCG tablet Take 1 tablet by mouth daily. 09/17/18   Orland Mustard, MD    Family History Family History  Problem Relation Age of Onset  . Depression Mother   . Miscarriages / [place] Mother   . Hypertension Father   . Alcohol abuse Father   . Learning disabilities Sister   . Hyperlipidemia Maternal Grandmother   . Alcohol abuse Maternal Grandfather   . Alcohol abuse Paternal Grandmother   . Hypertension Paternal Grandfather     Social History Social History   Tobacco Use  . Smoking status: Former Smoker    Packs/day: 0.50  Types: Cigarettes    Last attempt to quit: 05/10/2018    Years since quitting: 0.5  . Smokeless tobacco: Never Used  Substance Use Topics  . Alcohol use: Yes    Comment: 2-3 days/week  . Drug use: No     Allergies   Patient has no known allergies.   Review of Systems Review of Systems  Constitutional: Negative for chills and fever.  HENT: Negative.   Respiratory: Positive for shortness of breath.   Cardiovascular: Positive for palpitations.  Gastrointestinal: Negative.    Musculoskeletal: Negative.   Skin: Negative.   Neurological: Negative.   Psychiatric/Behavioral: The patient is nervous/anxious.      Physical Exam Updated Vital Signs BP (!) 150/118   Pulse (!) 120   Temp 98 F (36.7 C) (Oral)   Resp 16   SpO2 100%   Physical Exam Vitals signs and nursing note reviewed.  Constitutional:      Appearance: She is well-developed.     Comments: Patient is pacing, tearful, restless, and angry about having to answer questions.   HENT:     Head: Normocephalic.  Neck:     Musculoskeletal: Normal range of motion and neck supple.  Cardiovascular:     Rate and Rhythm: Tachycardia present.  Pulmonary:     Comments: tachypneic. Musculoskeletal: Normal range of motion.  Skin:    General: Skin is warm and dry.  Neurological:     General: No focal deficit present.     Mental Status: She is alert and oriented to person, place, and time.  Psychiatric:        Mood and Affect: Mood is anxious. Affect is angry and tearful.        Speech: Speech is rapid and pressured and tangential.        Behavior: Behavior is agitated and hyperactive.        Thought Content: Thought content does not include homicidal or suicidal ideation.     Comments: Patient is hard to keep focused on topic but is directable.      ED Treatments / Results  Labs (all labs ordered are listed, but only abnormal results are displayed) Labs Reviewed - No data to display  EKG None  Radiology No results found.  Procedures Procedures (including critical care time)  Medications Ordered in ED Medications  LORazepam (ATIVAN) tablet 2 mg (2 mg Oral Given 12/10/18 [id number])     Initial Impression / Assessment and Plan / ED Course  I have reviewed the triage vital signs and the nursing notes.  Pertinent labs & imaging results that were available during my care of the patient were reviewed by me and considered in my medical decision making (see chart for details).        Patient  to ED with panic attack. She is hysterical, but can be directed with stern approach.   Ativan 2 mg provided. There is significant improvement in her affect. VS improved. She is apologetic when calm. I spoke to her grandmother by phone who confirms no concern for SI or HI. She also confirms that [facility].   Discussed discharge home. I do not feel she is a danger to herself. Now calm, she is actually very pleasant and polite. Will write for #5 Ativan and discussed that this type of medication is not something regularly prescribed through the ED. She states she understands. She agrees she is comfortable with discharge home.   Final Clinical Impressions(s) /  ED Diagnoses   Final diagnoses:  None   1. Panic attack  ED Discharge Orders    None       Elpidio Anis, PA-C 12/10/18 0717    Paula Libra, MD 12/10/18 [phone]

## 2018-12-10 NOTE — ED Notes (Signed)
Patient keeps yelling "please help me" "I can't breathe" while in triage

## 2018-12-10 NOTE — Discharge Instructions (Addendum)
Use the Ativan 1 mg tablet as needed for anxiety/panic, with a maximum of three times daily.   Continue to get established with [facility] as planned for counselling.

## 2018-12-10 NOTE — ED Notes (Signed)
Pt provided with blankets and pillow. Resting in triage room watching tv. Anxiety significantly decreased.

## 2018-12-10 NOTE — ED Triage Notes (Addendum)
Pt reports having a panic attack and shortness of breath. Pt getting restless and impatient with staff. Seen yesterday for same.

## 2018-12-11 ENCOUNTER — Telehealth: Payer: Self-pay

## 2018-12-11 ENCOUNTER — Ambulatory Visit: Payer: 59 | Admitting: Family Medicine

## 2018-12-11 ENCOUNTER — Other Ambulatory Visit: Payer: Self-pay | Admitting: Family Medicine

## 2018-12-11 DIAGNOSIS — R4589 Other symptoms and signs involving emotional state: Secondary | ICD-10-CM

## 2018-12-11 DIAGNOSIS — F419 Anxiety disorder, unspecified: Secondary | ICD-10-CM

## 2018-12-11 NOTE — Telephone Encounter (Signed)
Spoke with pt and rescheduled appt to 5/27 per pt.

## 2018-12-11 NOTE — Telephone Encounter (Signed)
Spoke with patient's grandmother.  Patient N/S for appt today at 1 pm.  Grandmother states that patient has been back and forth to ED twice in the past 2 days for her anxiety and as of yesterday, refused IP treatment advice per ED physician.  I spoke with patient and she states that she refused IP treatment because she knew that she could not have her phone, could not communicate w/anyone to "let them know that she was ok" and just "didn't want to be locked up somewhere"  I explained that there was only so much that we could do for her treating as OP, and ultimately her treatment was up to her and we could not force her to make a decision.  I further explained that the Ativan that was prescribed was not good for her to continue to take as long as she was continuing to drink.  She became very angry and per grandmother threw her phone across the room.  I could hear patient screaming obscenities in the background and also insisting that she "was not drinking" Grandmother then took her phone and said she would "talk to me later" and hung up the phone.

## 2018-12-11 NOTE — Progress Notes (Signed)
Referral placed to requested psychiatry.

## 2018-12-12 ENCOUNTER — Encounter (HOSPITAL_COMMUNITY): Payer: Self-pay | Admitting: Emergency Medicine

## 2018-12-12 ENCOUNTER — Other Ambulatory Visit: Payer: Self-pay

## 2018-12-12 ENCOUNTER — Emergency Department (HOSPITAL_COMMUNITY)
Admission: EM | Admit: 2018-12-12 | Discharge: 2018-12-13 | Disposition: A | Discharge status: Home / Self Care | Payer: 59 | Source: Home / Self Care | Attending: Emergency Medicine | Admitting: Emergency Medicine

## 2018-12-12 DIAGNOSIS — F10239 Alcohol dependence with withdrawal, unspecified: Secondary | ICD-10-CM | POA: Diagnosis not present

## 2018-12-12 DIAGNOSIS — F1024 Alcohol dependence with alcohol-induced mood disorder: Secondary | ICD-10-CM | POA: Insufficient documentation

## 2018-12-12 DIAGNOSIS — F1010 Alcohol abuse, uncomplicated: Secondary | ICD-10-CM

## 2018-12-12 DIAGNOSIS — Z79899 Other long term (current) drug therapy: Secondary | ICD-10-CM | POA: Insufficient documentation

## 2018-12-12 DIAGNOSIS — R569 Unspecified convulsions: Secondary | ICD-10-CM | POA: Diagnosis not present

## 2018-12-12 DIAGNOSIS — F603 Borderline personality disorder: Secondary | ICD-10-CM | POA: Insufficient documentation

## 2018-12-12 DIAGNOSIS — Z87891 Personal history of nicotine dependence: Secondary | ICD-10-CM | POA: Insufficient documentation

## 2018-12-12 DIAGNOSIS — F419 Anxiety disorder, unspecified: Secondary | ICD-10-CM

## 2018-12-12 DIAGNOSIS — J45909 Unspecified asthma, uncomplicated: Secondary | ICD-10-CM | POA: Insufficient documentation

## 2018-12-12 LAB — CBC WITH DIFFERENTIAL/PLATELET
Abs Immature Granulocytes: 0.01 10*3/uL (ref 0.00–0.07)
Basophils Absolute: 0 10*3/uL (ref 0.0–0.1)
Basophils Relative: 1 %
Eosinophils Absolute: 0.2 10*3/uL (ref 0.0–0.5)
Eosinophils Relative: 3 %
HCT: 38.6 % (ref 36.0–46.0)
Hemoglobin: 13.7 g/dL (ref 12.0–15.0)
Immature Granulocytes: 0 %
Lymphocytes Relative: 43 %
Lymphs Abs: 2.7 10*3/uL (ref 0.7–4.0)
MCH: 36.3 pg — ABNORMAL HIGH (ref 26.0–34.0)
MCHC: 35.5 g/dL (ref 30.0–36.0)
MCV: 102.4 fL — ABNORMAL HIGH (ref 80.0–100.0)
Monocytes Absolute: 0.4 10*3/uL (ref 0.1–1.0)
Monocytes Relative: 6 %
Neutro Abs: 3.1 10*3/uL (ref 1.7–7.7)
Neutrophils Relative %: 47 %
Platelets: 137 10*3/uL — ABNORMAL LOW (ref 150–400)
RBC: 3.77 MIL/uL — ABNORMAL LOW (ref 3.87–5.11)
RDW: 14 % (ref 11.5–15.5)
WBC: 6.4 10*3/uL (ref 4.0–10.5)
nRBC: 0 % (ref 0.0–0.2)

## 2018-12-12 LAB — BASIC METABOLIC PANEL
Anion gap: 13 (ref 5–15)
BUN: 7 mg/dL (ref 6–20)
CO2: 26 mmol/L (ref 22–32)
Calcium: 8.8 mg/dL — ABNORMAL LOW (ref 8.9–10.3)
Chloride: 104 mmol/L (ref 98–111)
Creatinine, Ser: 0.49 mg/dL (ref 0.44–1.00)
GFR calc Af Amer: >60 mL/min (ref >60)
GFR calc non Af Amer: >60 mL/min (ref >60)
Glucose, Bld: 106 mg/dL — ABNORMAL HIGH (ref 70–99)
Potassium: 3.1 mmol/L — ABNORMAL LOW (ref 3.5–5.1)
Sodium: 143 mmol/L (ref 135–145)

## 2018-12-12 LAB — ETHANOL: Alcohol, Ethyl (B): 418 mg/dL (ref <10)

## 2018-12-12 LAB — SALICYLATE LEVEL: Salicylate Lvl: <7 mg/dL (ref 2.8–30.0)

## 2018-12-12 LAB — I-STAT BETA HCG BLOOD, ED (MC, WL, AP ONLY): I-stat hCG, quantitative: <5 m[IU]/mL (ref <5)

## 2018-12-12 LAB — ACETAMINOPHEN LEVEL: Acetaminophen (Tylenol), Serum: <10 ug/mL — ABNORMAL LOW (ref 10–30)

## 2018-12-12 MED ORDER — LORAZEPAM 1 MG PO TABS
1.0000 mg | ORAL_TABLET | Freq: Once | ORAL | Status: AC
  Administered 2018-12-12: 20:00:00 1 mg via ORAL
  Filled 2018-12-12: qty 1

## 2018-12-12 NOTE — BH Assessment (Signed)
BHH Assessment Progress Note   Patient has a BAL of 418 at 20:17.  Pt to be seen when BAL is decreased.

## 2018-12-12 NOTE — ED Notes (Signed)
Bed: WLPT3 Expected date:  Expected time:  Means of arrival:  Comments: 

## 2018-12-12 NOTE — ED Notes (Signed)
etoh level 418 Dr.Balfi made aware.

## 2018-12-12 NOTE — ED Notes (Signed)
Bed: HQ46 Expected date:  Expected time:  Means of arrival:  Comments: Room 25

## 2018-12-12 NOTE — ED Triage Notes (Signed)
Pt coming in for SOB with panic attack that started earlier today. Reports that her grandmother is staying with her because "she knows if I am left alone I will do I will regret." her grandmother thinks that she is withdrawing from the ativan she was given here the other day.

## 2018-12-12 NOTE — Telephone Encounter (Signed)
Noted and agree. Should have been admitted to inpatient care for behavorial health, but patient refused. Patient no showed her appointment as well. ER precautions given.

## 2018-12-12 NOTE — ED Notes (Signed)
Patient resting quietly.

## 2018-12-12 NOTE — ED Notes (Signed)
ED Provider at bedside. 

## 2018-12-12 NOTE — Discharge Instructions (Addendum)
Call your behavioral health specialist to set up an appointment for follow up.  Please return to the emergency department for any new or worsening symptoms including any suicidal thoughts, homicidal thoughts, or hallucinations.

## 2018-12-12 NOTE — ED Notes (Signed)
Pt alert and lethargic. Pt denies si,hi and avh at this time. Pt repeatedly asked for ativan, Cortni PA made aware. Pt safe, will continue to monitor.
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
## 2018-12-12 NOTE — ED Notes (Signed)
Pt crying in room asking someone to sit with her.  Pt walked in hallway asking someone to come in.  Pt instructed to go back into room and that someone will be in soon to assist her.

## 2018-12-13 LAB — RAPID URINE DRUG SCREEN, HOSP PERFORMED
Amphetamines: NOT DETECTED
Barbiturates: NOT DETECTED
Benzodiazepines: POSITIVE — AB
Cocaine: NOT DETECTED
Opiates: NOT DETECTED
Tetrahydrocannabinol: NOT DETECTED

## 2018-12-13 MED ORDER — SODIUM CHLORIDE 0.9 % IV BOLUS
1000.0000 mL | Freq: Once | INTRAVENOUS | Status: AC
  Administered 2018-12-13: 12:00:00 1000 mL via INTRAVENOUS

## 2018-12-13 MED ORDER — ONDANSETRON 8 MG PO TBDP
8.0000 mg | ORAL_TABLET | Freq: Once | ORAL | Status: AC
  Administered 2018-12-13: 8 mg via ORAL
  Filled 2018-12-13: qty 1

## 2018-12-13 MED ORDER — LORAZEPAM 1 MG PO TABS
1.0000 mg | ORAL_TABLET | Freq: Once | ORAL | Status: AC
  Administered 2018-12-13: 1 mg via ORAL
  Filled 2018-12-13 (×2): qty 1

## 2018-12-13 MED ORDER — LORAZEPAM 1 MG PO TABS
0.0000 mg | ORAL_TABLET | Freq: Four times a day (QID) | ORAL | Status: DC
  Administered 2018-12-13: 12:00:00 1 mg via ORAL
  Filled 2018-12-13: qty 1

## 2018-12-13 MED ORDER — LORAZEPAM 1 MG PO TABS: 0.0000 mg | ORAL_TABLET | Freq: Two times a day (BID) | ORAL | Status: DC

## 2018-12-13 MED ORDER — LORAZEPAM 2 MG/ML IJ SOLN
0.0000 mg | Freq: Four times a day (QID) | INTRAMUSCULAR | Status: DC
  Administered 2018-12-13: 2 mg via INTRAVENOUS
  Filled 2018-12-13: qty 1

## 2018-12-13 MED ORDER — THIAMINE HCL 100 MG/ML IJ SOLN: 100.0000 mg | Freq: Every day | INTRAMUSCULAR | Status: DC

## 2018-12-13 MED ORDER — LORAZEPAM 2 MG/ML IJ SOLN
1.0000 mg | Freq: Once | INTRAMUSCULAR | Status: AC
  Administered 2018-12-13: 1 mg via INTRAVENOUS
  Filled 2018-12-13: qty 1

## 2018-12-13 MED ORDER — SODIUM CHLORIDE 0.9 % IV BOLUS
1000.0000 mL | Freq: Once | INTRAVENOUS | Status: AC
  Administered 2018-12-13: 1000 mL via INTRAVENOUS

## 2018-12-13 MED ORDER — VITAMIN B-1 100 MG PO TABS
100.0000 mg | ORAL_TABLET | Freq: Every day | ORAL | Status: DC
  Administered 2018-12-13: 100 mg via ORAL
  Filled 2018-12-13: qty 1

## 2018-12-13 MED ORDER — HYDROXYZINE HCL 25 MG PO TABS
25.0000 mg | ORAL_TABLET | Freq: Once | ORAL | Status: AC
  Administered 2018-12-13: 25 mg via ORAL
  Filled 2018-12-13: qty 1

## 2018-12-13 MED ORDER — POTASSIUM CHLORIDE CRYS ER 20 MEQ PO TBCR
40.0000 meq | EXTENDED_RELEASE_TABLET | Freq: Once | ORAL | Status: AC
  Administered 2018-12-13: 40 meq via ORAL
  Filled 2018-12-13: qty 2

## 2018-12-13 MED ORDER — LORAZEPAM 2 MG/ML IJ SOLN: 0.0000 mg | Freq: Two times a day (BID) | INTRAMUSCULAR | Status: DC

## 2018-12-13 MED ORDER — METOCLOPRAMIDE HCL 5 MG/ML IJ SOLN
10.0000 mg | Freq: Once | INTRAMUSCULAR | Status: AC
  Administered 2018-12-13: 10 mg via INTRAVENOUS
  Filled 2018-12-13: qty 2

## 2018-12-13 NOTE — BH Assessment (Signed)
[facility] Assessment Progress Note Patient was also evaluated by [facility] NP who recommended patient be discharged later this date and follow up with OP resources provided.

## 2018-12-13 NOTE — ED Notes (Signed)
Patient is acticley vomiting, constant nausea with dry heaves and small epiosdes of emesis.

## 2018-12-13 NOTE — ED Notes (Signed)
Bed: ZY60 Expected date:  Expected time:  Means of arrival:  Comments: [place] #[phone]

## 2018-12-13 NOTE — ED Notes (Signed)
Pt now complaining of nausea and anxiety. Emesis bag given, comfort measures given.

## 2018-12-13 NOTE — Patient Outreach (Signed)
CPSS met with the patient in order to provide substance use recovery support and help with getting connected to substance use treatment resources. Patient does report a history of alcohol use and plans to follow up with an outpatient substance use treatment center. CPSS talked to the patient in depth about outpatient substance use treatment resources located in [place], [place]. Some of these outpatient resources include the [facility] outpatient substance use treatment program for individuals ages 13-26. CPSS provided a flier for both the [facility] as well as an outpatient substance use treatment center list for the [place] area. CPSS also provided CPSS contact information. CPSS strongly encouraged the patient to continue to stay in contact with CPSS if needed after discharge from the [facility] for further help with getting connected to substance use treatment resources.

## 2018-12-13 NOTE — ED Notes (Signed)
Bed: WA09 Expected date:  Expected time:  Means of arrival:  Comments: 
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
## 2018-12-13 NOTE — ED Provider Notes (Signed)
Notified by nursing staff that the patient is vomiting.  Will give Zofran ODT.  Notified again by nursing staff that the patient is still vomiting.  Old EKGs are reviewed, patient does have some prolonged QTC, do not feel comfortable giving any further QT prolonging agents.  Will give Ativan.  RN states that patient cannot tolerate p.o. Ativan.  Will have patient moved back to the acute side for IV placement, fluids, and IV Ativan.  CIWA score calculated at 17, CIWA protocol activated.  6:13 AM Patient reassessed, she states that she is feeling better, but is still nauseated and having occasional dry heaves.  RN calculates updated CIWA score of 11.    Patient is not unstable, I do not believe she needs admission, but will continue with CIWA protocol and plan for TTS evaluation.  6:53 AM Patient signed out to Ward, PA-C, who will continue care.  TTS pending.   Roxy Horseman, PA-C 12/13/18 1572    Dione Booze, MD 12/13/18 [phone]

## 2018-12-13 NOTE — ED Notes (Signed)
Patient endorses that nausea has improved, less vomiting than before.

## 2018-12-13 NOTE — BH Assessment (Addendum)
[facility] Assessment Progress Note This Clinical research associate spoke with [facility] charge nurse Darl Pikes who reported that patient continues to have ongoing nausea and recommends patient be seen later this date to be assessed.

## 2018-12-13 NOTE — ED Provider Notes (Signed)
Care assumed from previous provider PA [facility]. Please see note for further details. Case discussed, plan agreed upon. Patient awaiting TTS consultation. Initially elevated ETOH. Showed signs of ETOH withdrawal overnight and placed on CIWA. Will continue to monitor with disposition pending TTS recommendations.   After several doses of Ativan, patient still complaining of nausea and did have episode of emesis after trying to eat lunch. EKG done to check QT. QT 436, QTc of 503. Discussed case with attending, Dr. Jacqulyn Bath, including persistent n/v and QT prolongation. Recommends cardiac monitoring and 10mg  Reglan for symptom control.   Patient evaluated by TTS who recommends discharge.  She was offered 300 bed for substance abuse, but declined.  She does not want to voluntarily stay for inpatient admission of any kind.   On my evaluation, patient states that she is ready to go home.  Benign abdominal exam.  Tolerating p.o.  I discussed with her once again the options of staying for further help, but she declines. Encouraged her to reach out with [facility] if she changed her mind.    Bannie Lobban, Chase Picket, PA-C 12/13/18 1452    Maia Plan, MD 12/13/18 1524

## 2018-12-13 NOTE — ED Notes (Signed)
Pt continues to vomit, PA Rob notified, pending transfer to Main ED.

## 2018-12-13 NOTE — ED Notes (Signed)
TTS at bedside talking with patient.  

## 2018-12-13 NOTE — ED Notes (Signed)
Pt A&O x 3, no distress noted, calm & cooperative at present, sleeping at present.  Monitoring for safety.
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
## 2018-12-15 ENCOUNTER — Emergency Department (HOSPITAL_COMMUNITY): Payer: 59

## 2018-12-15 ENCOUNTER — Other Ambulatory Visit: Payer: Self-pay

## 2018-12-15 ENCOUNTER — Inpatient Hospital Stay (HOSPITAL_COMMUNITY)
Admission: EM | Admit: 2018-12-15 | Discharge: 2018-12-18 | DRG: 897 | Disposition: A | Discharge status: Home / Self Care | Payer: 59 | Attending: Internal Medicine | Admitting: Internal Medicine

## 2018-12-15 ENCOUNTER — Encounter (HOSPITAL_COMMUNITY): Payer: Self-pay

## 2018-12-15 DIAGNOSIS — Y908 Blood alcohol level of 240 mg/100 ml or more: Secondary | ICD-10-CM | POA: Diagnosis present

## 2018-12-15 DIAGNOSIS — F1024 Alcohol dependence with alcohol-induced mood disorder: Secondary | ICD-10-CM | POA: Diagnosis present

## 2018-12-15 DIAGNOSIS — F419 Anxiety disorder, unspecified: Secondary | ICD-10-CM | POA: Diagnosis not present

## 2018-12-15 DIAGNOSIS — E876 Hypokalemia: Secondary | ICD-10-CM | POA: Diagnosis present

## 2018-12-15 DIAGNOSIS — Z87891 Personal history of nicotine dependence: Secondary | ICD-10-CM

## 2018-12-15 DIAGNOSIS — F10939 Alcohol use, unspecified with withdrawal, unspecified: Secondary | ICD-10-CM | POA: Diagnosis present

## 2018-12-15 DIAGNOSIS — F1094 Alcohol use, unspecified with alcohol-induced mood disorder: Secondary | ICD-10-CM | POA: Diagnosis not present

## 2018-12-15 DIAGNOSIS — F410 Panic disorder [episodic paroxysmal anxiety] without agoraphobia: Secondary | ICD-10-CM | POA: Diagnosis present

## 2018-12-15 DIAGNOSIS — Z765 Malingerer [conscious simulation]: Secondary | ICD-10-CM

## 2018-12-15 DIAGNOSIS — R569 Unspecified convulsions: Secondary | ICD-10-CM | POA: Diagnosis present

## 2018-12-15 DIAGNOSIS — Z811 Family history of alcohol abuse and dependence: Secondary | ICD-10-CM | POA: Diagnosis not present

## 2018-12-15 DIAGNOSIS — Z20828 Contact with and (suspected) exposure to other viral communicable diseases: Secondary | ICD-10-CM | POA: Diagnosis present

## 2018-12-15 DIAGNOSIS — Z8249 Family history of ischemic heart disease and other diseases of the circulatory system: Secondary | ICD-10-CM

## 2018-12-15 DIAGNOSIS — R45851 Suicidal ideations: Secondary | ICD-10-CM | POA: Diagnosis present

## 2018-12-15 DIAGNOSIS — F329 Major depressive disorder, single episode, unspecified: Secondary | ICD-10-CM | POA: Diagnosis present

## 2018-12-15 DIAGNOSIS — F411 Generalized anxiety disorder: Secondary | ICD-10-CM | POA: Diagnosis present

## 2018-12-15 DIAGNOSIS — F10239 Alcohol dependence with withdrawal, unspecified: Secondary | ICD-10-CM | POA: Diagnosis present

## 2018-12-15 DIAGNOSIS — J45909 Unspecified asthma, uncomplicated: Secondary | ICD-10-CM | POA: Diagnosis present

## 2018-12-15 DIAGNOSIS — Z818 Family history of other mental and behavioral disorders: Secondary | ICD-10-CM | POA: Diagnosis not present

## 2018-12-15 DIAGNOSIS — Z8349 Family history of other endocrine, nutritional and metabolic diseases: Secondary | ICD-10-CM

## 2018-12-15 HISTORY — DX: Depression, unspecified: F32.A

## 2018-12-15 HISTORY — DX: Anxiety disorder, unspecified: F41.9

## 2018-12-15 LAB — CBC WITH DIFFERENTIAL/PLATELET
Abs Immature Granulocytes: 0.03 10*3/uL (ref 0.00–0.07)
Basophils Absolute: 0 10*3/uL (ref 0.0–0.1)
Basophils Relative: 0 %
Eosinophils Absolute: 0.1 10*3/uL (ref 0.0–0.5)
Eosinophils Relative: 2 %
HCT: 37.1 % (ref 36.0–46.0)
Hemoglobin: 12.8 g/dL (ref 12.0–15.0)
Immature Granulocytes: 0 %
Lymphocytes Relative: 20 %
Lymphs Abs: 1.4 10*3/uL (ref 0.7–4.0)
MCH: 35.8 pg — ABNORMAL HIGH (ref 26.0–34.0)
MCHC: 34.5 g/dL (ref 30.0–36.0)
MCV: 103.6 fL — ABNORMAL HIGH (ref 80.0–100.0)
Monocytes Absolute: 0.4 10*3/uL (ref 0.1–1.0)
Monocytes Relative: 5 %
Neutro Abs: 5 10*3/uL (ref 1.7–7.7)
Neutrophils Relative %: 73 %
Platelets: 116 10*3/uL — ABNORMAL LOW (ref 150–400)
RBC: 3.58 MIL/uL — ABNORMAL LOW (ref 3.87–5.11)
RDW: 13.9 % (ref 11.5–15.5)
WBC: 6.9 10*3/uL (ref 4.0–10.5)
nRBC: 0.3 % — ABNORMAL HIGH (ref 0.0–0.2)

## 2018-12-15 LAB — COMPREHENSIVE METABOLIC PANEL
ALT: 40 U/L (ref 0–44)
AST: 87 U/L — ABNORMAL HIGH (ref 15–41)
Albumin: 4.1 g/dL (ref 3.5–5.0)
Alkaline Phosphatase: 97 U/L (ref 38–126)
Anion gap: 16 — ABNORMAL HIGH (ref 5–15)
BUN: <5 mg/dL — ABNORMAL LOW (ref 6–20)
CO2: 20 mmol/L — ABNORMAL LOW (ref 22–32)
Calcium: 9.1 mg/dL (ref 8.9–10.3)
Chloride: 101 mmol/L (ref 98–111)
Creatinine, Ser: 0.69 mg/dL (ref 0.44–1.00)
GFR calc Af Amer: >60 mL/min (ref >60)
GFR calc non Af Amer: >60 mL/min (ref >60)
Glucose, Bld: 153 mg/dL — ABNORMAL HIGH (ref 70–99)
Potassium: 3.1 mmol/L — ABNORMAL LOW (ref 3.5–5.1)
Sodium: 137 mmol/L (ref 135–145)
Total Bilirubin: 1 mg/dL (ref 0.3–1.2)
Total Protein: 7.1 g/dL (ref 6.5–8.1)

## 2018-12-15 LAB — RAPID URINE DRUG SCREEN, HOSP PERFORMED
Amphetamines: NOT DETECTED
Barbiturates: NOT DETECTED
Benzodiazepines: POSITIVE — AB
Cocaine: NOT DETECTED
Opiates: NOT DETECTED
Tetrahydrocannabinol: NOT DETECTED

## 2018-12-15 LAB — I-STAT BETA HCG BLOOD, ED (MC, WL, AP ONLY): I-stat hCG, quantitative: <5 m[IU]/mL (ref <5)

## 2018-12-15 LAB — SARS CORONAVIRUS 2 BY RT PCR (HOSPITAL ORDER, PERFORMED IN ~~LOC~~ HOSPITAL LAB): SARS Coronavirus 2: NEGATIVE

## 2018-12-15 LAB — ETHANOL: Alcohol, Ethyl (B): <10 mg/dL (ref <10)

## 2018-12-15 LAB — CBG MONITORING, ED: Glucose-Capillary: 115 mg/dL — ABNORMAL HIGH (ref 70–99)

## 2018-12-15 LAB — MAGNESIUM: Magnesium: 1.3 mg/dL — ABNORMAL LOW (ref 1.7–2.4)

## 2018-12-15 MED ORDER — ADULT MULTIVITAMIN W/MINERALS CH
1.0000 | ORAL_TABLET | Freq: Every day | ORAL | Status: DC
  Administered 2018-12-16 – 2018-12-18 (×3): 1 via ORAL
  Filled 2018-12-15 (×4): qty 1

## 2018-12-15 MED ORDER — SODIUM CHLORIDE 0.45 % IV SOLN
INTRAVENOUS | Status: DC
  Administered 2018-12-15 – 2018-12-17 (×4): via INTRAVENOUS

## 2018-12-15 MED ORDER — FOLIC ACID 1 MG PO TABS
1.0000 mg | ORAL_TABLET | Freq: Every day | ORAL | Status: DC
  Administered 2018-12-15 – 2018-12-18 (×4): 1 mg via ORAL
  Filled 2018-12-15 (×4): qty 1

## 2018-12-15 MED ORDER — HYDROXYZINE HCL 25 MG PO TABS
25.0000 mg | ORAL_TABLET | Freq: Three times a day (TID) | ORAL | Status: DC | PRN
  Administered 2018-12-16: 25 mg via ORAL
  Filled 2018-12-15: qty 1

## 2018-12-15 MED ORDER — ADULT MULTIVITAMIN W/MINERALS CH: 1.0000 | ORAL_TABLET | Freq: Every day | ORAL | Status: DC

## 2018-12-15 MED ORDER — LORAZEPAM 1 MG PO TABS
1.0000 mg | ORAL_TABLET | Freq: Four times a day (QID) | ORAL | Status: DC | PRN
  Administered 2018-12-16 – 2018-12-18 (×9): 1 mg via ORAL
  Filled 2018-12-15 (×10): qty 1

## 2018-12-15 MED ORDER — ENOXAPARIN SODIUM 40 MG/0.4ML ~~LOC~~ SOLN
40.0000 mg | SUBCUTANEOUS | Status: DC
  Administered 2018-12-16: 17:00:00 40 mg via SUBCUTANEOUS
  Filled 2018-12-15: qty 0.4

## 2018-12-15 MED ORDER — VITAMIN B-1 100 MG PO TABS
100.0000 mg | ORAL_TABLET | Freq: Every day | ORAL | Status: DC
  Administered 2018-12-15 – 2018-12-18 (×4): 100 mg via ORAL
  Filled 2018-12-15 (×4): qty 1

## 2018-12-15 MED ORDER — CULTURELLE PO CAPS: ORAL_CAPSULE | Freq: Every day | ORAL | Status: DC

## 2018-12-15 MED ORDER — FLUOXETINE HCL 20 MG PO CAPS
20.0000 mg | ORAL_CAPSULE | Freq: Every day | ORAL | Status: DC
  Administered 2018-12-15 – 2018-12-18 (×4): 20 mg via ORAL
  Filled 2018-12-15 (×4): qty 1

## 2018-12-15 MED ORDER — POTASSIUM CHLORIDE CRYS ER 20 MEQ PO TBCR
40.0000 meq | EXTENDED_RELEASE_TABLET | Freq: Once | ORAL | Status: AC
  Administered 2018-12-15: 40 meq via ORAL
  Filled 2018-12-15: qty 2

## 2018-12-15 MED ORDER — THIAMINE HCL 100 MG/ML IJ SOLN: 100.0000 mg | Freq: Every day | INTRAMUSCULAR | Status: DC

## 2018-12-15 MED ORDER — LORAZEPAM 2 MG/ML IJ SOLN
1.0000 mg | Freq: Once | INTRAMUSCULAR | Status: AC
  Administered 2018-12-15: 1 mg via INTRAVENOUS
  Filled 2018-12-15: qty 1

## 2018-12-15 MED ORDER — LORAZEPAM 2 MG/ML IJ SOLN
1.0000 mg | Freq: Four times a day (QID) | INTRAMUSCULAR | Status: DC | PRN
  Administered 2018-12-15 – 2018-12-16 (×2): 1 mg via INTRAVENOUS
  Filled 2018-12-15 (×2): qty 1

## 2018-12-15 MED ORDER — LORAZEPAM 2 MG/ML IJ SOLN
0.5000 mg | Freq: Once | INTRAMUSCULAR | Status: AC
  Administered 2018-12-15: 0.5 mg via INTRAVENOUS
  Filled 2018-12-15: qty 1

## 2018-12-15 MED ORDER — MAGNESIUM SULFATE 2 GM/50ML IV SOLN
2.0000 g | Freq: Once | INTRAVENOUS | Status: AC
  Administered 2018-12-15: 2 g via INTRAVENOUS
  Filled 2018-12-15: qty 50

## 2018-12-15 MED ORDER — THIAMINE HCL 100 MG/ML IJ SOLN
100.0000 mg | Freq: Once | INTRAMUSCULAR | Status: AC
  Administered 2018-12-15: 15:00:00 100 mg via INTRAVENOUS
  Filled 2018-12-15: qty 2

## 2018-12-15 MED ORDER — NORETHIN ACE-ETH ESTRAD-FE 1-20 MG-MCG PO TABS: 1.0000 | ORAL_TABLET | Freq: Every day | ORAL | Status: DC

## 2018-12-15 MED ORDER — MAGNESIUM OXIDE 400 (241.3 MG) MG PO TABS
400.0000 mg | ORAL_TABLET | Freq: Two times a day (BID) | ORAL | Status: DC
  Administered 2018-12-15 – 2018-12-18 (×6): 400 mg via ORAL
  Filled 2018-12-15 (×6): qty 1

## 2018-12-15 NOTE — ED Notes (Signed)
Pt c/o increasing anxiety. Will inform provider.

## 2018-12-15 NOTE — ED Notes (Signed)
ED TO INPATIENT HANDOFF REPORT  ED Nurse Name and Phone #: Aldean Jewett [phone]   S Name/Age/Gender [name] 23 y.o. female Room/Bed: 018C/018C  Code Status   Code Status: Prior  Home/SNF/Other Home Patient oriented to: self, place, time and situation Is this baseline? Yes   Triage Complete: Triage complete  Chief Complaint seizure  Triage Note Pt arrives EMS from home with c/oi witnessed seizure lasting 2 minutes per grandmother. No hx of seizure. Hx of panic attacks. Given iv Zofran 4 mg PTA .    Allergies No Known Allergies  Level of Care/Admitting Diagnosis ED Disposition    ED Disposition Condition Comment   Admit  Hospital Area: [facility] [[id number]]  Level of Care: Progressive [102]  Covid Evaluation: N/A  Diagnosis: Alcohol withdrawal (HCC) [291.81.ICD-9-CM]  Admitting Physician: Carron Curie [[id number]]  Attending Physician: Sharyon Medicus, ALI Bai.Lain  Estimated length of stay: past midnight tomorrow  Certification:: I certify this patient will need inpatient services for at least 2 midnights  PT Class (Do Not Modify): Inpatient [101]  PT Acc Code (Do Not Modify): Private [1]       B Medical/Surgery History Past Medical History:  Diagnosis Date  . Asthma   . Pyelonephritis   . Sepsis [facility])    Past Surgical History:  Procedure Laterality Date  . dental procedure       A IV Location/Drains/Wounds Patient Lines/Drains/Airways Status   Active Line/Drains/Airways    Name:   Placement date:   Placement time:   Site:   Days:   Peripheral IV 12/15/18 Right Hand   12/15/18    1349    Hand   less than 1          Intake/Output Last 24 hours No intake or output data in the 24 hours ending 12/15/18 1640  Labs/Imaging Results for orders placed or performed during the hospital encounter of 12/15/18 (from the past 48 hour(s))  CBC WITH DIFFERENTIAL     Status: Abnormal   Collection Time: 12/15/18  2:13 PM  Result Value Ref Range   WBC 6.9 4.0 - 10.5 K/uL   RBC 3.58 (L) 3.87 - 5.11 MIL/uL   Hemoglobin 12.8 12.0 - 15.0 g/dL   HCT 02.7 25.3 - 66.4 %   MCV 103.6 (H) 80.0 - 100.0 fL   MCH 35.8 (H) 26.0 - 34.0 pg   MCHC 34.5 30.0 - 36.0 g/dL   RDW 40.3 47.4 - 25.9 %   Platelets 116 (L) 150 - 400 K/uL    Comment: REPEATED TO VERIFY PLATELET COUNT CONFIRMED BY SMEAR Immature Platelet Fraction may be clinically indicated, consider ordering this additional test [id number]    nRBC 0.3 (H) 0.0 - 0.2 %   Neutrophils Relative % 73 %   Neutro Abs 5.0 1.7 - 7.7 K/uL   Lymphocytes Relative 20 %   Lymphs Abs 1.4 0.7 - 4.0 K/uL   Monocytes Relative 5 %   Monocytes Absolute 0.4 0.1 - 1.0 K/uL   Eosinophils Relative 2 %   Eosinophils Absolute 0.1 0.0 - 0.5 K/uL   Basophils Relative 0 %   Basophils Absolute 0.0 0.0 - 0.1 K/uL   Immature Granulocytes 0 %   Abs Immature Granulocytes 0.03 0.00 - 0.07 K/uL    Comment: Performed at [facility], [address], [place], [place] [address]  Ethanol     Status: None   Collection Time: 12/15/18  2:13 PM  Result Value Ref Range   Alcohol,  Ethyl (B) <10 <10 mg/dL    Comment: (NOTE) Lowest detectable limit for serum alcohol is 10 mg/dL. For medical purposes only. Performed at [facility], [address] [place], [place] [address]   Rapid urine drug screen (hospital performed)     Status: Abnormal   Collection Time: 12/15/18  2:13 PM  Result Value Ref Range   Opiates NONE DETECTED NONE DETECTED   Cocaine NONE DETECTED NONE DETECTED   Benzodiazepines POSITIVE (A) NONE DETECTED   Amphetamines NONE DETECTED NONE DETECTED   Tetrahydrocannabinol NONE DETECTED NONE DETECTED   Barbiturates NONE DETECTED NONE DETECTED    Comment: (NOTE) DRUG SCREEN FOR MEDICAL PURPOSES ONLY.  IF CONFIRMATION IS NEEDED FOR ANY PURPOSE, NOTIFY LAB WITHIN 5 DAYS. LOWEST DETECTABLE LIMITS FOR URINE DRUG SCREEN Drug Class                     Cutoff (ng/mL) Amphetamine and metabolites     1000 Barbiturate and metabolites    200 Benzodiazepine                 200 Tricyclics and metabolites     300 Opiates and metabolites        300 Cocaine and metabolites        300 THC                            50 Performed at [facility], [address] [place], [place] [address]   Magnesium     Status: Abnormal   Collection Time: 12/15/18  2:13 PM  Result Value Ref Range   Magnesium 1.3 (L) 1.7 - 2.4 mg/dL    Comment: Performed at [facility], [address] [place], [place] [address]  Comprehensive metabolic panel     Status: Abnormal   Collection Time: 12/15/18  2:16 PM  Result Value Ref Range   Sodium 137 135 - 145 mmol/L   Potassium 3.1 (L) 3.5 - 5.1 mmol/L   Chloride 101 98 - 111 mmol/L   CO2 20 (L) 22 - 32 mmol/L   Glucose, Bld 153 (H) 70 - 99 mg/dL   BUN <5 (L) 6 - 20 mg/dL   Creatinine, Ser 9.140.69 0.44 - 1.00 mg/dL   Calcium 9.1 8.9 - 78.210.3 mg/dL   Total Protein 7.1 6.5 - 8.1 g/dL   Albumin 4.1 3.5 - 5.0 g/dL   AST 87 (H) 15 - 41 U/L   ALT 40 0 - 44 U/L   Alkaline Phosphatase 97 38 - 126 U/L   Total Bilirubin 1.0 0.3 - 1.2 mg/dL   GFR calc non Af Amer >60 >60 mL/min   GFR calc Af Amer >60 >60 mL/min   Anion gap 16 (H) 5 - 15    Comment: Performed at [facility], [address] [place], [place] [address]  I-Stat beta hCG blood, ED     Status: None   Collection Time: 12/15/18  2:25 PM  Result Value Ref Range   I-stat hCG, quantitative <5.0 <5 mIU/mL   Comment 3            Comment:   GEST. AGE      CONC.  (mIU/mL)   <=1 WEEK        5 - 50     2 WEEKS       50 - 500     3 WEEKS  100 - 10,000     4 WEEKS     1,000 - 30,000        FEMALE AND NON-PREGNANT FEMALE:     LESS THAN 5 mIU/mL   CBG monitoring, ED     Status: Abnormal   Collection Time: 12/15/18  2:26 PM  Result Value Ref Range   Glucose-Capillary 115 (H) 70 - 99 mg/dL   Ct Head Wo Contrast  Result Date: 12/15/2018 CLINICAL DATA:  Seizure EXAM: CT HEAD WITHOUT CONTRAST  TECHNIQUE: Contiguous axial images were obtained from the base of the skull through the vertex without intravenous contrast. COMPARISON:  None. FINDINGS: Brain: No evidence of acute infarction, hemorrhage, hydrocephalus, extra-axial collection or mass lesion/mass effect. Vascular: No hyperdense vessel or unexpected calcification. Skull: Normal. Negative for fracture or focal lesion. Sinuses/Orbits: No acute finding. Other: None. IMPRESSION: No acute intracranial pathology. Electronically Signed   By: Lauralyn Primes M.D.   On: 12/15/2018 15:46    Pending Labs Unresulted Labs (From admission, onward)    Start     Ordered   12/15/18 1603  SARS Coronavirus 2 (CEPHEID - Performed in [facility] hospital lab), Hosp Order  (Asymptomatic Patients Labs)  Once,   R    Question:  Rule Out  Answer:  Yes   12/15/18 1603   Signed and Held  Basic metabolic panel  Tomorrow morning,   R     Signed and Held   Signed and Held  Magnesium  Tomorrow morning,   R     Signed and Held          Vitals/Pain Today's Vitals   12/15/18 1545 12/15/18 1600 12/15/18 1615 12/15/18 1630  BP: (!) 140/115 (!) 145/109 (!) 137/123 (!) 148/97  Pulse: (!) 112 (!) 106 (!) 101 97  Resp: 19 16 (!) 22 20  Temp:      TempSrc:      SpO2: 100% 99% 100% 97%  Weight:      Height:      PainSc:        Isolation Precautions No active isolations  Medications Medications  magnesium sulfate IVPB 2 g 50 mL (2 g Intravenous New Bag/Given 12/15/18 1617)  thiamine (B-1) injection 100 mg (100 mg Intravenous Given 12/15/18 1448)  LORazepam (ATIVAN) injection 0.5 mg (0.5 mg Intravenous Given 12/15/18 1442)  potassium chloride SA (K-DUR) CR tablet 40 mEq (40 mEq Oral Given 12/15/18 1608)  LORazepam (ATIVAN) injection 1 mg (1 mg Intravenous Given 12/15/18 1609)    Mobility walks Low fall risk   Focused Assessments Neuro Assessment Handoff:  Swallow screen pass? na Cardiac Rhythm: Sinus tachycardia       Neuro Assessment: Exceptions  to WDL Neuro Checks:      Last Documented NIHSS Modified Score:   Has TPA been given? No If patient is a Neuro Trauma and patient is going to OR before floor call report to 4N Charge nurse: [phone] or [phone]     R Recommendations: See Admitting Provider Note  Report given to:   Additional Notes:

## 2018-12-15 NOTE — ED Notes (Signed)
Patient transported to CT 

## 2018-12-15 NOTE — Progress Notes (Signed)
Patient reports that her home medication for anxiety and depression is not working for her. She would like some guidance and resources to help her.

## 2018-12-15 NOTE — H&P (Addendum)
[facility]                                                                                    Patient Demographics  Natasha Pope, is a 23 y.o. female  CSN: [id number]  MRN: [id number]  DOB - 01-Aug-1995  Admit Date - 12/15/2018  Outpatient Primary MD for the patient is Orland Mustard, MD   With History of -  Past Medical History:  Diagnosis Date  . Asthma   . Pyelonephritis   . Sepsis [facility])       Past Surgical History:  Procedure Laterality Date  . dental procedure      in for   Chief Complaint  Patient presents with  . Seizures     HPI  Natasha Pope  is a 23 y.o. female, with past medical history significant for asthma, severe anxiety and depression and alcoholism presenting with a 2 minutes witnessed seizure, grand mal by her grandmother.  Postictal episode was noted for short period of time.  No history of seizures in the past.  Patient reports a long history of alcoholism that stopped around 10 days ago.  No history of fever chills nausea vomiting or diarrhea.  When I saw the patient she was back to baseline with no confusion.  Patient was complaining that her primary medical doctor would not prescribe Ativan for her.    Review of Systems    In addition to the HPI above,  No Fever-chills, No Headache, No changes with Vision or hearing, No problems swallowing food or Liquids, No Chest pain, Cough or Shortness of Breath, No Abdominal pain, No Blood in stool or Urine, No dysuria, No new skin rashes or bruises, No new joints pains-aches,  No new weakness, tingling, numbness in any extremity, No recent weight gain or loss, No polyuria, polydypsia or polyphagia, No significant Mental Stressors.  A full 10 point Review of Systems was done, except as stated above, all other Review of Systems were negative.   Social History Social History   Tobacco Use  . Smoking status: Former Smoker    Packs/day: 0.50    Types: Cigarettes    Last attempt to quit: 05/10/2018    Years since quitting: 0.6  . Smokeless tobacco: Never Used  Substance Use Topics  . Alcohol use: Yes    Comment: 2-3 days/week     Family History Family History  Problem Relation Age of Onset  . Depression Mother   . Miscarriages / [place] Mother   . Hypertension Father   . Alcohol abuse Father   . Learning disabilities Sister   . Hyperlipidemia Maternal Grandmother   . Alcohol abuse Maternal Grandfather   . Alcohol abuse Paternal Grandmother   . Hypertension Paternal Grandfather      Prior to Admission medications   Medication Sig Start Date End Date Taking? Authorizing Provider  FLUoxetine (PROZAC) 20 MG tablet Take 1 tablet (20 mg total) by mouth daily. 11/17/18  Yes Orland Mustard, MD  hydrOXYzine (ATARAX/VISTARIL) 25 MG tablet take half to one tablet by mouth up to three times a day as needed for anxiety Patient taking differently: Take 25 mg by mouth  3 (three) times daily as needed for anxiety.  11/27/18  Yes Orland Mustard, MD  ibuprofen (ADVIL) 200 MG tablet Take 400-600 mg by mouth every 6 (six) hours as needed for headache or cramping (pain).   Yes [provider]  Lactobacillus Rhamnosus, GG, (CULTURELLE PO) Take 1 capsule by mouth daily.   Yes [provider]  Multiple Vitamin (MULTIVITAMIN WITH MINERALS) TABS tablet Take 1 tablet by mouth daily.   Yes [provider]  norethindrone-ethinyl estradiol (JUNEL FE,GILDESS FE,LOESTRIN FE) 1-20 MG-MCG tablet Take 1 tablet by mouth daily. 09/17/18  Yes Orland Mustard, MD  Potassium Chloride ER 20 MEQ TBCR Take 20 mEq by mouth 2 (two) times daily.    Yes [provider]  LORazepam (ATIVAN) 1 MG tablet Take 1 tablet (1 mg total) by mouth 3 (three) times daily as needed for anxiety. Patient not taking: Reported on 12/15/2018 12/10/18   Elpidio Anis, PA-C    No Known Allergies  Physical Exam  Vitals  Blood pressure (!) 137/123, pulse (!) 101,  temperature 98.5 F (36.9 C), temperature source Oral, resp. rate (!) 22, height 5\' 7"  (1.702 m), weight 63.5 kg, last menstrual period 12/04/2018, SpO2 100 %.   1. General Young female, well-nourished  2. Normal affect and insight, anxious, alert awake oriented x3  3. No F.N deficits, grossly, patient moving all extremities.  4. Ears and Eyes appear Normal, Conjunctivae clear, PERRLA. Moist Oral Mucosa.  5. Supple Neck, No JVD, No cervical lymphadenopathy appriciated, No Carotid Bruits.  6. Symmetrical Chest wall movement, Good air movement bilaterally, CTAB.  7. RRR, No Gallops, Rubs or Murmurs, No Parasternal Heave.  8. Positive Bowel Sounds, Abdomen Soft, Non tender,.  9.  No Cyanosis, Normal Skin Turgor, No Skin Rash or Bruise.  10. Good muscle tone,  joints appear normal , no effusions, Normal ROM.    Data Review  CBC Recent Labs  Lab 12/08/18 2214 12/12/18 2017 12/15/18 1413  WBC 7.3 6.4 6.9  HGB 14.7 13.7 12.8  HCT 43.3 38.6 37.1  PLT 241 137* 116*  MCV 103.1* 102.4* 103.6*  MCH 35.0* 36.3* 35.8*  MCHC 33.9 35.5 34.5  RDW 14.3 14.0 13.9  LYMPHSABS  --  2.7 1.4  MONOABS  --  0.4 0.4  EOSABS  --  0.2 0.1  BASOSABS  --  0.0 0.0   ------------------------------------------------------------------------------------------------------------------  Chemistries  Recent Labs  Lab 12/08/18 2214 12/12/18 2017 12/15/18 1413 12/15/18 1416  NA 142 143  --  137  K 3.4* 3.1*  --  3.1*  CL 105 104  --  101  CO2 20* 26  --  20*  GLUCOSE 121* 106*  --  153*  BUN <5* 7  --  <5*  CREATININE 0.63 0.49  --  0.69  CALCIUM 9.0 8.8*  --  9.1  MG  --   --  1.3*  --   AST 198*  --   --  87*  ALT 75*  --   --  40  ALKPHOS 94  --   --  97  BILITOT 0.7  --   --  1.0   ------------------------------------------------------------------------------------------------------------------ estimated creatinine clearance is 107.3 mL/min (by C-G formula based on SCr of 0.69  mg/dL). ------------------------------------------------------------------------------------------------------------------ No results for input(s): TSH, T4TOTAL, T3FREE, THYROIDAB in the last 72 hours.  Invalid input(s): FREET3   Coagulation profile No results for input(s): INR, PROTIME in the last 168 hours. ------------------------------------------------------------------------------------------------------------------- No results for input(s): DDIMER in the last 72  hours. -------------------------------------------------------------------------------------------------------------------  Cardiac Enzymes No results for input(s): CKMB, TROPONINI, MYOGLOBIN in the last 168 hours.  Invalid input(s): CK ------------------------------------------------------------------------------------------------------------------ Invalid input(s): POCBNP   ---------------------------------------------------------------------------------------------------------------  Urinalysis    Component Value Date/Time   COLORURINE AMBER (A) 05/19/2018 1516   APPEARANCEUR CLOUDY (A) 05/19/2018 1516   LABSPEC 1.026 05/19/2018 1516   PHURINE 5.0 05/19/2018 1516   GLUCOSEU NEGATIVE 05/19/2018 1516   HGBUR SMALL (A) 05/19/2018 1516   BILIRUBINUR MODERATE (A) 05/19/2018 1516   KETONESUR NEGATIVE 05/19/2018 1516   PROTEINUR >=300 (A) 05/19/2018 1516   NITRITE NEGATIVE 05/19/2018 1516   LEUKOCYTESUR MODERATE (A) 05/19/2018 1516    ----------------------------------------------------------------------------------------------------------------    Imaging results:   Ct Head Wo Contrast  Result Date: 12/15/2018 CLINICAL DATA:  Seizure EXAM: CT HEAD WITHOUT CONTRAST TECHNIQUE: Contiguous axial images were obtained from the base of the skull through the vertex without intravenous contrast. COMPARISON:  None. FINDINGS: Brain: No evidence of acute infarction, hemorrhage, hydrocephalus, extra-axial collection or  mass lesion/mass effect. Vascular: No hyperdense vessel or unexpected calcification. Skull: Normal. Negative for fracture or focal lesion. Sinuses/Orbits: No acute finding. Other: None. IMPRESSION: No acute intracranial pathology. Electronically Signed   By: Lauralyn PrimesAlex  Bibbey M.D.   On: 12/15/2018 15:46    My personal review of EKG:   Assessment & Plan  Seizure, first episode?  Alcohol withdrawal Check EEG Place in progressive Neurochecks  Alcoholism Thiamine/folate PRN Ativan  Anxiety/depression Continue with Prozac/hydroxyzine  Drug-seeking behavior Patient is asking for Ativan as outpatient !! Discussed with her and explained to her that withdrawal from Ativan is as bad as alcohol itself  Hypokalemia and hypomagnesemia Replete  DVT Prophylaxis Lovenox  AM Labs Ordered, also please review Full Orders   Code Status full  Disposition Plan: Home Time spent in minutes : 35 minutes  Condition GUARDED   @SIGNATURE @

## 2018-12-15 NOTE — ED Notes (Signed)
Pt aware of need for urine sample, oriented to call button to request assistance

## 2018-12-15 NOTE — ED Notes (Signed)
pT C/O DISCOMFORT WITH IV MAG. DISCUSSED WITH md AND GIVEN WITH ns AT 50 CC/H

## 2018-12-15 NOTE — ED Provider Notes (Signed)
[facility] EMERGENCY DEPARTMENT Provider Note   CSN: [id number] Arrival date & time: 12/15/18  1347    History   Chief Complaint Chief Complaint  Patient presents with  . Seizures    HPI Natasha Pope is a 23 y.o. female with history of asthma, alcohol abuse presents today for possible seizure.  Patient reports that she was lying in bed and she began feeling unwell and believes that she passed out, she reports that she remembers shaking and feeling confused afterwards.  Triage note reveals a seizure-like activity was witnessed by grandmother for 2 minutes.  Patient reports that she has never had a seizure before.  Patient reports that prior to her seizure today she was feeling tired and "shaky" she reports one episode of vomiting prior to seizure-like activity, 1-2 episodes of vomiting afterwards. Patient reports that she stopped using alcohol 2-3 days ago, multiple drinks daily prior.  Patient denies history of fever/chills, chest pain or shortness of breath she reports feeling anxious at this time.  With continued nausea however no abdominal pain no other complaints.    HPI  Past Medical History:  Diagnosis Date  . Asthma   . Pyelonephritis   . Sepsis [facility])     Patient Active Problem List   Diagnosis Date Noted  . Substance induced mood disorder (HCC) 12/09/2018  . Alcohol abuse 12/09/2018  . Acute pyelonephritis 05/19/2018  . Sepsis (HCC) 05/19/2018  . Hypokalemia 05/19/2018  . Hypocalcemia 05/19/2018  . Hypomagnesemia 05/19/2018  . AKI (acute kidney injury) (HCC) 05/19/2018  . Macrocytic anemia 05/19/2018  . Electrolyte disturbance 05/19/2018  . Thrombocytopenia (HCC) 05/19/2018  . Alcohol use 05/19/2018  . ASCUS of cervix with negative high risk HPV 03/07/2018  . Tobacco abuse 01/31/2018  . Asthma 01/31/2018  . GAD (generalized anxiety disorder) 01/31/2018    Past Surgical History:  Procedure Laterality Date  . dental procedure        OB History   No obstetric history on file.      Home Medications    Prior to Admission medications   Medication Sig Start Date End Date Taking? Authorizing Provider  FLUoxetine (PROZAC) 20 MG tablet Take 1 tablet (20 mg total) by mouth daily. 11/17/18  Yes Orland Mustard, MD  hydrOXYzine (ATARAX/VISTARIL) 25 MG tablet take half to one tablet by mouth up to three times a day as needed for anxiety Patient taking differently: Take 25 mg by mouth 3 (three) times daily as needed for anxiety.  11/27/18  Yes Orland Mustard, MD  ibuprofen (ADVIL) 200 MG tablet Take 400-600 mg by mouth every 6 (six) hours as needed for headache or cramping (pain).   Yes [provider]  Lactobacillus Rhamnosus, GG, (CULTURELLE PO) Take 1 capsule by mouth daily.   Yes [provider]  Multiple Vitamin (MULTIVITAMIN WITH MINERALS) TABS tablet Take 1 tablet by mouth daily.   Yes [provider]  norethindrone-ethinyl estradiol (JUNEL FE,GILDESS FE,LOESTRIN FE) 1-20 MG-MCG tablet Take 1 tablet by mouth daily. 09/17/18  Yes Orland Mustard, MD  Potassium Chloride ER 20 MEQ TBCR Take 20 mEq by mouth 2 (two) times daily.    Yes [provider]  LORazepam (ATIVAN) 1 MG tablet Take 1 tablet (1 mg total) by mouth 3 (three) times daily as needed for anxiety. Patient not taking: Reported on 12/15/2018 12/10/18   Elpidio Anis, PA-C    Family History Family History  Problem Relation Age of Onset  . Depression Mother   .  Miscarriages / [place] Mother   . Hypertension Father   . Alcohol abuse Father   . Learning disabilities Sister   . Hyperlipidemia Maternal Grandmother   . Alcohol abuse Maternal Grandfather   . Alcohol abuse Paternal Grandmother   . Hypertension Paternal Grandfather     Social History Social History   Tobacco Use  . Smoking status: Former Smoker    Packs/day: 0.50    Types: Cigarettes    Last attempt to quit: 05/10/2018    Years since quitting: 0.6  .  Smokeless tobacco: Never Used  Substance Use Topics  . Alcohol use: Yes    Comment: 2-3 days/week  . Drug use: No     Allergies   Patient has no known allergies.   Review of Systems Review of Systems  Constitutional: Negative.  Negative for chills and fever.  Eyes: Negative.  Negative for visual disturbance.  Respiratory: Negative.  Negative for cough and shortness of breath.   Cardiovascular: Negative.  Negative for chest pain.  Gastrointestinal: Positive for nausea and vomiting. Negative for abdominal pain and diarrhea.  Genitourinary: Negative.  Negative for dysuria and hematuria.  Musculoskeletal: Negative for back pain and neck pain.  Neurological: Positive for tremors, seizures and light-headedness. Negative for headaches.  Psychiatric/Behavioral: The patient is nervous/anxious.   All other systems reviewed and are negative.  Physical Exam Updated Vital Signs BP (!) 137/116   Pulse (!) 103   Temp 98.5 F (36.9 C) (Oral)   Resp 20   Ht 5\' 7"  (1.702 m)   Wt 63.5 kg   LMP 12/04/2018   SpO2 99%   BMI 21.93 kg/m   Physical Exam Constitutional:      General: She is not in acute distress.    Appearance: Normal appearance. She is well-developed. She is not ill-appearing or diaphoretic.     Comments: Disheveled appearing  HENT:     Head: Normocephalic and atraumatic.     Right Ear: External ear normal.     Left Ear: External ear normal.     Nose: Nose normal.     Mouth/Throat:     Mouth: Mucous membranes are moist.     Pharynx: Oropharynx is clear.  Eyes:     General: Vision grossly intact. Gaze aligned appropriately.     Extraocular Movements: Extraocular movements intact.     Conjunctiva/sclera: Conjunctivae normal.     Pupils: Pupils are equal, round, and reactive to light.  Neck:     Musculoskeletal: Normal range of motion.     Trachea: Trachea and phonation normal. No tracheal deviation.  Cardiovascular:     Rate and Rhythm: Regular rhythm. Tachycardia  present.     Pulses: Normal pulses.          Dorsalis pedis pulses are 2+ on the right side and 2+ on the left side.       Posterior tibial pulses are 2+ on the right side and 2+ on the left side.     Heart sounds: Normal heart sounds.  Pulmonary:     Effort: Pulmonary effort is normal. No respiratory distress.     Breath sounds: Normal breath sounds.  Chest:     Chest wall: No tenderness or crepitus.  Abdominal:     General: There is no distension.     Palpations: Abdomen is soft.     Tenderness: There is no abdominal tenderness. There is no guarding or rebound.     Comments: No sign of injury to the abdomen  Genitourinary:    Comments: Deferred Musculoskeletal: Normal range of motion.     Comments: No midline C/T/L spinal tenderness to palpation, no paraspinal muscle tenderness, no deformity, crepitus, or step-off noted. No sign of injury to the neck or back. - No pain with palpation of the hips bilaterally.  Patient is able bring bilateral knees to chest without pain.  Feet:     Right foot:     Protective Sensation: 5 sites tested. 5 sites sensed.     Left foot:     Protective Sensation: 5 sites tested. 5 sites sensed.  Skin:    General: Skin is warm and dry.  Neurological:     Mental Status: She is alert.     GCS: GCS eye subscore is 4. GCS verbal subscore is 5. GCS motor subscore is 6.     Comments: Speech is clear and goal oriented, follows commands Major Cranial nerves without deficit, no facial droop Normal strength in upper and lower extremities bilaterally including dorsiflexion and plantar flexion, strong and equal grip strength Sensation normal to light and sharp touch Moves extremities without ataxia, coordination intact Normal finger to nose and rapid alternating movements Neg romberg, no pronator drift Mild tremors noted extremities x 4  Psychiatric:        Behavior: Behavior normal.    ED Treatments / Results  Labs (all labs ordered are listed, but only  abnormal results are displayed) Labs Reviewed  CBC WITH DIFFERENTIAL/PLATELET - Abnormal; Notable for the following components:      Result Value   RBC 3.58 (*)    MCV 103.6 (*)    MCH 35.8 (*)    Platelets 116 (*)    nRBC 0.3 (*)    All other components within normal limits  RAPID URINE DRUG SCREEN, HOSP PERFORMED - Abnormal; Notable for the following components:   Benzodiazepines POSITIVE (*)    All other components within normal limits  COMPREHENSIVE METABOLIC PANEL - Abnormal; Notable for the following components:   Potassium 3.1 (*)    CO2 20 (*)    Glucose, Bld 153 (*)    BUN <5 (*)    AST 87 (*)    Anion gap 16 (*)    All other components within normal limits  MAGNESIUM - Abnormal; Notable for the following components:   Magnesium 1.3 (*)    All other components within normal limits  CBG MONITORING, ED - Abnormal; Notable for the following components:   Glucose-Capillary 115 (*)    All other components within normal limits  SARS CORONAVIRUS 2 (HOSPITAL ORDER, PERFORMED IN [facility])  ETHANOL  I-STAT BETA HCG BLOOD, ED ([facility], [facility], [facility] ONLY)    EKG None  Radiology Ct Head Wo Contrast  Result Date: 12/15/2018 CLINICAL DATA:  Seizure EXAM: CT HEAD WITHOUT CONTRAST TECHNIQUE: Contiguous axial images were obtained from the base of the skull through the vertex without intravenous contrast. COMPARISON:  None. FINDINGS: Brain: No evidence of acute infarction, hemorrhage, hydrocephalus, extra-axial collection or mass lesion/mass effect. Vascular: No hyperdense vessel or unexpected calcification. Skull: Normal. Negative for fracture or focal lesion. Sinuses/Orbits: No acute finding. Other: None. IMPRESSION: No acute intracranial pathology. Electronically Signed   By: Lauralyn Primes M.D.   On: 12/15/2018 15:46    Procedures Procedures (including critical care time)  Medications Ordered in ED Medications  magnesium sulfate IVPB 2 g 50 mL (has no administration in time  range)  potassium chloride SA (K-DUR) CR tablet 40 mEq (  has no administration in time range)  LORazepam (ATIVAN) injection 1 mg (has no administration in time range)  thiamine (B-1) injection 100 mg (100 mg Intravenous Given 12/15/18 1448)  LORazepam (ATIVAN) injection 0.5 mg (0.5 mg Intravenous Given 12/15/18 1442)     Initial Impression / Assessment and Plan / ED Course  I have reviewed the triage vital signs and the nursing notes.  Pertinent labs & imaging results that were available during my care of the patient were reviewed by me and considered in my medical decision making (see chart for details).  Clinical Course as of Dec 15 1611  Mon Dec 15, 2018  1526 CIWA 9   [BM]  1611 Discussed case w Dr. Sharyon Medicus hospitalist to see patient in ED.   [BM]    Clinical Course User Index [BM] Bill Salinas, PA-C   23 year old female with history of EtOH abuse presents today for apparent seizure-like activity x2 minutes while lying in bed.  Patient reports that she is feeling tremulous and nauseous with one episode of vomiting prior to seizure activity.  Denies seizure history.  Patient reports daily alcohol use however denies drinking any alcohol in the past 2-3 days.  Chart review shows that patient was here in the emergency department between 5/22 and 5/23 at which time patient required several doses of Ativan for signs of EtOH withdrawal.  Upon ED arrival patient with CIWA score of 9.  CBG 115 CMP with hypokalemia 3.1, AST elevated, anion gap 16 CBC nonacute UDS positive for benzodiazepines Ethanol level negative Magnesium 1.3 Beta-hCG negative CT Head:  IMPRESSION: No acute intracranial pathology. EKG with sinus tachycardia, borderline prolonged QT reviewed with Dr. Madilyn Hook  Case discussed with Dr. Madilyn Hook, will give patient 2 g of magnesium, 40 p.o. K-Dur, Ativan and place patient in CIWA protocol.  Patient reassessed, sitting up in bed, patient remains anxious and tremulous despite  Ativan, she will need to be brought in the hospital for observation and continued CIWA.  Patient is agreeable to admission/observation at this time.  She has no new complaints she is requesting more medication for anxiety.  On CIWA protocol with Ativan. - Case discussed with Dr. Sharyon Medicus from hospitalist service, seeing patient in ED for admission.  Screening COVID-19 test added for admission.  Note: Portions of this report may have been transcribed using voice recognition software. Every effort was made to ensure accuracy; however, inadvertent computerized transcription errors may still be present. Final Clinical Impressions(s) / ED Diagnoses   Final diagnoses:  Seizure-like activity (HCC)  Alcohol withdrawal syndrome with complication [facility])    ED Discharge Orders    None       Elizabeth Palau 12/15/18 1622    Tilden Fossa, MD 12/17/18 [phone]

## 2018-12-15 NOTE — ED Triage Notes (Signed)
Pt arrives EMS from home with c/oi witnessed seizure lasting 2 minutes per grandmother. No hx of seizure. Hx of panic attacks. Given iv Zofran 4 mg PTA .

## 2018-12-16 ENCOUNTER — Telehealth: Payer: Self-pay | Admitting: Family Medicine

## 2018-12-16 ENCOUNTER — Inpatient Hospital Stay (HOSPITAL_COMMUNITY): Payer: 59

## 2018-12-16 ENCOUNTER — Encounter (HOSPITAL_COMMUNITY): Payer: Self-pay | Admitting: General Practice

## 2018-12-16 DIAGNOSIS — F419 Anxiety disorder, unspecified: Secondary | ICD-10-CM

## 2018-12-16 DIAGNOSIS — F1094 Alcohol use, unspecified with alcohol-induced mood disorder: Secondary | ICD-10-CM | POA: Diagnosis present

## 2018-12-16 DIAGNOSIS — R569 Unspecified convulsions: Secondary | ICD-10-CM

## 2018-12-16 DIAGNOSIS — F329 Major depressive disorder, single episode, unspecified: Secondary | ICD-10-CM

## 2018-12-16 LAB — BASIC METABOLIC PANEL
Anion gap: 13 (ref 5–15)
BUN: <5 mg/dL — ABNORMAL LOW (ref 6–20)
CO2: 21 mmol/L — ABNORMAL LOW (ref 22–32)
Calcium: 9.6 mg/dL (ref 8.9–10.3)
Chloride: 105 mmol/L (ref 98–111)
Creatinine, Ser: 0.68 mg/dL (ref 0.44–1.00)
GFR calc Af Amer: >60 mL/min (ref >60)
GFR calc non Af Amer: >60 mL/min (ref >60)
Glucose, Bld: 82 mg/dL (ref 70–99)
Potassium: 4.2 mmol/L (ref 3.5–5.1)
Sodium: 139 mmol/L (ref 135–145)

## 2018-12-16 LAB — MAGNESIUM: Magnesium: 1.8 mg/dL (ref 1.7–2.4)

## 2018-12-16 MED ORDER — GABAPENTIN 100 MG PO CAPS
100.0000 mg | ORAL_CAPSULE | Freq: Three times a day (TID) | ORAL | Status: DC
  Administered 2018-12-16 – 2018-12-17 (×3): 100 mg via ORAL
  Filled 2018-12-16 (×3): qty 1

## 2018-12-16 MED ORDER — IBUPROFEN 400 MG PO TABS
400.0000 mg | ORAL_TABLET | Freq: Once | ORAL | Status: AC
  Administered 2018-12-16: 21:00:00 400 mg via ORAL
  Filled 2018-12-16: qty 1

## 2018-12-16 MED ORDER — ONDANSETRON HCL 4 MG/2ML IJ SOLN: 4.0000 mg | Freq: Four times a day (QID) | INTRAMUSCULAR | Status: DC | PRN

## 2018-12-16 NOTE — Progress Notes (Signed)
Pt continues to ask for Ativan even when it was just administrated and hour before. Pt stated that she can get it twice an hour. Pt was educated about her medication and how often she would be able to receive her medication per doctors orders. Pt was  concerned about her IV, she was assured   that her IV was fine as her fluids was running properly. RN even reinforced it with tape in order to make pt more comfortable. Pt then stepped in the hall and stated that her IV came out when questioned about it, pt stated she removed her IV after it came out.

## 2018-12-16 NOTE — Progress Notes (Signed)
Pt refused labs, pt was educated on the importance of lab work. Will continue to monitor.
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
## 2018-12-16 NOTE — Procedures (Signed)
  [facility] Josef Tourigny A. Gerilyn Pilgrim, MD     [email or web address]           HISTORY: The patient is a 23 year old female who presents with new onset seizures.  There is a history of alcohol abuse.  MEDICATIONS:  Current Facility-Administered Medications:  .  0.45 % sodium chloride infusion, , Intravenous, Continuous, Hijazi, Ali, MD, Last Rate: 75 mL/hr at 12/16/18 1607 .  enoxaparin (LOVENOX) injection 40 mg, 40 mg, Subcutaneous, Q24H, Hijazi, Ali, MD, 40 mg at 12/16/18 1714 .  FLUoxetine (PROZAC) capsule 20 mg, 20 mg, Oral, Daily, Carron Curie, MD, 20 mg at 12/16/18 0848 .  folic acid (FOLVITE) tablet 1 mg, 1 mg, Oral, Daily, Carron Curie, MD, 1 mg at 12/16/18 0847 .  gabapentin (NEURONTIN) capsule 100 mg, 100 mg, Oral, TID, Dahal, Binaya, MD, 100 mg at 12/16/18 1609 .  LORazepam (ATIVAN) tablet 1 mg, 1 mg, Oral, Q6H PRN, 1 mg at 12/16/18 1714 **OR** LORazepam (ATIVAN) injection 1 mg, 1 mg, Intravenous, Q6H PRN, Carron Curie, MD, 1 mg at 12/16/18 1207 .  magnesium oxide (MAG-OX) tablet 400 mg, 400 mg, Oral, BID, Carron Curie, MD, 400 mg at 12/16/18 0848 .  multivitamin with minerals tablet 1 tablet, 1 tablet, Oral, Daily, Carron Curie, MD, 1 tablet at 12/16/18 0848 .  thiamine (VITAMIN B-1) tablet 100 mg, 100 mg, Oral, Daily, 100 mg at 12/16/18 0849 **OR** thiamine (B-1) injection 100 mg, 100 mg, Intravenous, Daily, Hijazi, Ali, MD     ANALYSIS: A 16 channel recording using standard 10 20 measurements is conducted for 21 minutes.  There is a well-formed posterior dominant rhythm of 11 Hz which attenuates with eye opening.  There is also a 16 Hz low amplitude background activity that is observed at times.  There is beta activity observed in the frontal areas.  Awake and occasional drowsy activities are observed.  Photic stimulation and hyperventilation are conducted without abnormal changes in the background activity.  There is no focal or lateral slowing.  There is no epileptiform  activity is observed.   IMPRESSION: 1.  This is a normal recording of awake and drowsy states.      Virginio Isidore A. Gerilyn Pilgrim, M.D.  Diplomate, Biomedical engineer of Psychiatry and Neurology ( Neurology).

## 2018-12-16 NOTE — Progress Notes (Signed)
EEG completed, results pending. 

## 2018-12-16 NOTE — Progress Notes (Signed)
PROGRESS NOTE  Natasha Pope Natasha Pope ZOX:[id number] DOB: 11/16/1995 DOA: 12/15/2018 PCP: Orland MustardWolfe, Allison, MD   LOS: 1 day   Brief narrative: Natasha Pope  is a 23 y.o. female, with past medical history significant for asthma, severe anxiety and depression and alcoholism presenting with a 2 minutes witnessed seizure, grand mal by her grandmother.  Postictal episode was noted for short period of time.  No history of seizures in the past.  Patient reports a long history of alcoholism that stopped around 10 days ago.  No history of fever chills nausea vomiting or diarrhea.  Subjective: Patient was seen and examined this afternoon.  Young female.  Sitting up in bed.  Not in distress at this time.  Patient complains, ' my primary doctor does not mean up with my depression or anxiety.  I would like Ativan which does not give me.  Will you prescribe it for me?'  Assessment/Plan:  Principal Problem:   Alcohol use with alcohol-induced mood disorder (HCC) Active Problems:   Alcohol withdrawal (HCC)  Alcohol withdrawal seizure -Currently not having withdrawal symptoms. -Psych consult obtained.  Recommended gabapentin 100 mg 3 times daily for alcohol use/anxiety. Can increase up to 300 mg TID.   Depression/anxiety -Psych recommended Prozac 20 mg daily for depression.  Recommended to stop Atarax.  QTC prolongation -QTC 508 on 5/25. -Repeat EKG tomorrow.  Chronic embolism Thiamine/folate PRN Ativan  Hypokalemia and hypomagnesemia Repleted  Mobility: Encourage ambulation Diet: Advance to regular diet today DVT prophylaxis:  Lovenox subcu Code Status:   Code Status: Full Code  Family Communication:  Expected Discharge:  Home likely tomorrow  Consultants:  Psychiatry  Procedures:    Antimicrobials:  Anti-infectives (From admission, onward)   None     Infusions:  . sodium chloride 75 mL/hr at 12/16/18 1400    Scheduled Meds: . enoxaparin (LOVENOX) injection  40 mg  Subcutaneous Q24H  . FLUoxetine  20 mg Oral Daily  . folic acid  1 mg Oral Daily  . gabapentin  100 mg Oral TID  . magnesium oxide  400 mg Oral BID  . multivitamin with minerals  1 tablet Oral Daily  . thiamine  100 mg Oral Daily   Or  . thiamine  100 mg Intravenous Daily    PRN meds: hydrOXYzine, LORazepam **OR** LORazepam, ondansetron (ZOFRAN) IV   Objective: Vitals:   12/16/18 0400 12/16/18 1300  BP: (!) 146/113 (!) 143/105  Pulse: 97 99  Resp: 18 (!) 22  Temp: 98.2 F (36.8 C) 98.1 F (36.7 C)  SpO2: 100% 99%    Intake/Output Summary (Last 24 hours) at 12/16/2018 1552 Last data filed at 12/16/2018 0800 Gross per 24 hour  Intake 581.24 ml  Output -  Net 581.24 ml   Filed Weights   12/15/18 1354  Weight: 63.5 kg   Weight change:  Body mass index is 21.93 kg/m.   Physical Exam: General exam: Appears calm and comfortable.  Skin: No rashes, lesions or ulcers. HEENT: Atraumatic, normocephalic, supple neck, no obvious bleeding Lungs: Clear to auscultation bilaterally CVS: Regular rate and rhythm, no murmur GI/Abd soft, nontender, nondistended, bowel sound present CNS: Alert, awake, oriented x3 Psychiatry: Appropriate mood at this time Extremities: No pedal edema, no calf tenderness  Data Review: I have personally reviewed the laboratory data and studies available.  Recent Labs  Lab 12/12/18 2017 12/15/18 1413  WBC 6.4 6.9  NEUTROABS 3.1 5.0  HGB 13.7 12.8  HCT 38.6 37.1  MCV 102.4* 103.6*  PLT 137*  116*   Recent Labs  Lab 12/12/18 2017 12/15/18 1413 12/15/18 1416 12/16/18 0609  NA 143  --  137 139  K 3.1*  --  3.1* 4.2  CL 104  --  101 105  CO2 26  --  20* 21*  GLUCOSE 106*  --  153* 82  BUN 7  --  <5* <5*  CREATININE 0.49  --  0.69 0.68  CALCIUM 8.8*  --  9.1 9.6  MG  --  1.3*  --  1.8    Lorin Glass, MD  [facility] 12/16/2018

## 2018-12-16 NOTE — Telephone Encounter (Signed)
°  Called and let Debbie,patients grandmother know that Dr. Artis Flock and her clinical staff were out of the office today and that someone would call her back tomorrow.      Copied from CRM [id number]. Topic: General - Other >> Dec 16, 2018  1:36 PM Jaquita Rector A wrote: Reason for CRM: Patient grandmother called to say that patient had a seizure and is in the Hospital. She is asking for a call back from Dr Artis Flock or her nurse please Ph# [phone]

## 2018-12-17 ENCOUNTER — Ambulatory Visit: Payer: 59 | Admitting: Family Medicine

## 2018-12-17 MED ORDER — GABAPENTIN 300 MG PO CAPS
300.0000 mg | ORAL_CAPSULE | Freq: Three times a day (TID) | ORAL | Status: DC
  Administered 2018-12-17 – 2018-12-18 (×3): 300 mg via ORAL
  Filled 2018-12-17 (×3): qty 1

## 2018-12-17 NOTE — Telephone Encounter (Signed)
Spoke to patient's grandmother.  Patient has been admitted to hospital for seizure due to alcohol withdrawal.  Spent 35 mins on the phone listening to and counseling grandmother, advising on what to do for patient.  Grandmother gravely concerned due to the extensive nature of trauma that patient has suffered over the course of her life.  She feels that this trauma has largely contributed to the alcohol abuse that patient currently suffers from.  I advised that we can only offer the help/assistance that is medically available to [name]; it is up to her to accept it.  I recommended that she start out by accepting the OP treatment options and setting strict guidelines for patient.  Advised that if she falls outside of those guidelines that for the safety and well being of the patient, IP treatment would be the only alternative.  Grandmother verbalized understanding and stated that she would keep [place] posted on Zariah's progress.

## 2018-12-17 NOTE — Telephone Encounter (Signed)
See note

## 2018-12-17 NOTE — Progress Notes (Signed)
PROGRESS NOTE  Natasha Pope [place] YME:[id number] DOB: 1995-11-20 DOA: 12/15/2018 PCP: Orland Mustard, MD   LOS: 2 days   Brief narrative: Natasha Pope  is a 23 y.o. female, with past medical history significant for asthma, severe anxiety and depression and alcoholism presenting with a 2 minutes witnessed seizure, grand mal by her grandmother.  Postictal episode was noted for short period of time.  No history of seizures in the past.  Patient reports a long history of alcoholism that stopped around 10 days ago.  No history of fever chills nausea vomiting or diarrhea.  Subjective:  Patient was seen and examined at bedside with nursing present.  Complains of palpitations and uneasy feeling.  Requests up titration of her gabapentin as offered by psychiatry yesterday.  No other complaints at this time.  Offered social work assistance for her underlying substance abuse disorder.  Denies headache, no fever/chills/night sweats, no chest pain, no nausea/vomiting/diarrhea, no cough/congestion, no abdominal pain, no weakness, no paresthesias.  Assessment/Plan:  Principal Problem:   Alcohol use with alcohol-induced mood disorder (HCC) Active Problems:   Alcohol withdrawal (HCC)  Alcohol withdrawal seizure A presenting to ED after being found by her grandmother following seizure-like activity.  Long history of alcohol abuse in which she stopped roughly 10 days prior to hospitalization.  EtOH level less than 10 on admission.  UDS positive for benzos.  EEG Normal.  Head without acute intracranial abnormality. --Psychiatry consulted, recommended initiation of gabapentin 100 mg TID, and can titrate up to 300mg  PO TID --continue to monitor on [facility] protocol  --social work consult for outpatient substance abuse programs  Depression/anxiety --Psych recommended Prozac 20 mg daily for depression.   --Recommended to stop Atarax.  QTC prolongation -QTC 508 on 5/25. -Repeat EKG tomorrow.   Chronic embolism --Thiamine/folate --PRN Ativan  Hypokalemia and hypomagnesemia --Repleted  Mobility: Encourage ambulation Diet: Regular diet DVT prophylaxis:  Lovenox subcu Code Status:   Code Status: Full Code  Family Communication: none Expected Discharge:  Home likely tomorrow  Consultants:  Psychiatry  Procedures:  none  Antimicrobials:  Anti-infectives (From admission, onward)   None     Infusions:  . sodium chloride 75 mL/hr at 12/17/18 0654    Scheduled Meds: . enoxaparin (LOVENOX) injection  40 mg Subcutaneous Q24H  . FLUoxetine  20 mg Oral Daily  . folic acid  1 mg Oral Daily  . gabapentin  300 mg Oral TID  . magnesium oxide  400 mg Oral BID  . multivitamin with minerals  1 tablet Oral Daily  . thiamine  100 mg Oral Daily   Or  . thiamine  100 mg Intravenous Daily    PRN meds: LORazepam **OR** LORazepam   Objective: Vitals:   12/17/18 1000 12/17/18 1414  BP:  (!) 130/92  Pulse: (!) 108 79  Resp:  20  Temp:  98.7 F (37.1 C)  SpO2:  100%    Intake/Output Summary (Last 24 hours) at 12/17/2018 1658 Last data filed at 12/17/2018 1322 Gross per 24 hour  Intake 2200.84 ml  Output -  Net 2200.84 ml   Filed Weights   12/15/18 1354  Weight: 63.5 kg   Weight change:  Body mass index is 21.93 kg/m.   Physical Exam: General exam: Appears calm and comfortable.  Skin: No rashes, lesions or ulcers. HEENT: Atraumatic, normocephalic, supple neck, no obvious bleeding Lungs: Clear to auscultation bilaterally CVS: Regular rate and rhythm, no murmur GI/Abd soft, nontender, nondistended, bowel sound present CNS: Alert, awake, oriented  x3 Psychiatry: Appropriate mood at this time Extremities: No pedal edema, no calf tenderness  Data Review: I have personally reviewed the laboratory data and studies available.  Recent Labs  Lab 12/12/18 2017 12/15/18 1413  WBC 6.4 6.9  NEUTROABS 3.1 5.0  HGB 13.7 12.8  HCT 38.6 37.1  MCV 102.4* 103.6*   PLT 137* 116*   Recent Labs  Lab 12/12/18 2017 12/15/18 1413 12/15/18 1416 12/16/18 0609  NA 143  --  137 139  K 3.1*  --  3.1* 4.2  CL 104  --  101 105  CO2 26  --  20* 21*  GLUCOSE 106*  --  153* 82  BUN 7  --  <5* <5*  CREATININE 0.49  --  0.69 0.68  CALCIUM 8.8*  --  9.1 9.6  MG  --  1.3*  --  1.8    Alvira PhilipsEric J [place], DO  [facility] 12/17/2018

## 2018-12-17 NOTE — Telephone Encounter (Signed)
Pts grandmother called again requesting a call back regarding her granddaughter being hospitalized. She is very concerned please advise.

## 2018-12-17 NOTE — Telephone Encounter (Signed)
Agree. Also following advice of inpatient care team.  Orland Mustard, MD  [facility]

## 2018-12-18 MED ORDER — LORAZEPAM 1 MG PO TABS: 1.0000 mg | ORAL_TABLET | Freq: Three times a day (TID) | ORAL | 0 refills | Status: AC | PRN

## 2018-12-18 MED ORDER — FLUOXETINE HCL 20 MG PO TABS: 20.0000 mg | ORAL_TABLET | Freq: Every day | ORAL | 1 refills | Status: DC

## 2018-12-18 MED ORDER — IBUPROFEN 400 MG PO TABS
400.0000 mg | ORAL_TABLET | Freq: Once | ORAL | Status: AC
  Administered 2018-12-18: 07:00:00 400 mg via ORAL
  Filled 2018-12-18: qty 1

## 2018-12-18 MED ORDER — GABAPENTIN 300 MG PO CAPS: 300.0000 mg | ORAL_CAPSULE | Freq: Three times a day (TID) | ORAL | 0 refills | Status: DC

## 2018-12-18 NOTE — Discharge Instructions (Signed)
Alcohol Withdrawal Syndrome When a person who drinks a lot of alcohol stops drinking, he or she may have unpleasant and serious symptoms. These symptoms are called alcohol withdrawal syndrome. This condition may be mild or severe. It can be life-threatening. It can cause:  Shaking that you cannot control (tremor).  Sweating.  Headache.  Feeling fearful, upset, grouchy, or depressed.  Trouble sleeping (insomnia).  Nightmares.  Fast or uneven heartbeats (palpitations).  Alcohol cravings.  Feeling sick to your stomach (nausea).  Throwing up (vomiting).  Being bothered by light and sounds.  Confusion.  Trouble thinking clearly.  Not being hungry (loss of appetite).  Big changes in mood (mood swings). If you have all of the following symptoms at the same time, get help right away:  High blood pressure.  Fast heartbeat.  Trouble breathing.  Seizures.  Seeing, hearing, feeling, smelling, or tasting things that are not there (hallucinations). These symptoms are known as delirium tremens (DTs). They must be treated at the hospital right away. Follow these instructions at home:   Take over-the-counter and prescription medicines only as told by your doctor. This includes vitamins.  Do not drink alcohol.  Do not drive until your doctor says that this is safe for you.  Have someone stay with you or be available in case you need help. This should be someone you trust. This person can help you with your symptoms. He or she can also help you to not drink.  Drink enough fluid to keep your pee (urine) pale yellow.  Think about joining a support group or a treatment program to help you stop drinking.  Keep all follow-up visits as told by your doctor. This is important. Contact a doctor if:  Your symptoms get worse.  You cannot eat or drink without throwing up.  You have a hard time not drinking alcohol.  You cannot stop drinking alcohol. Get help right away  if:  You have fast or uneven heartbeats.  You have chest pain.  You have trouble breathing.  You have a seizure for the first time.  You see, hear, feel, smell, or taste something that is not there.  You get very confused. Summary  When a person who drinks a lot of alcohol stops drinking, he or she may have serious symptoms. This is called alcohol withdrawal syndrome.  Delirium tremens (DTs) is a group of life-threatening symptoms. You should get help right away if you have these symptoms.  Think about joining an alcohol support group or a treatment program. This information is not intended to replace advice given to you by your health care provider. Make sure you discuss any questions you have with your health care provider. Document Released: 12/26/2007 Document Revised: 03/15/2017 Document Reviewed: 03/15/2017 Elsevier Interactive Patient Education  2019 [employer]   Alcohol Use Disorder Alcohol use disorder is when your drinking disrupts your daily life. When you have this condition, you drink too much alcohol and you cannot control your drinking. Alcohol use disorder can cause serious problems with your physical health. It can affect your brain, heart, liver, pancreas, immune system, stomach, and intestines. Alcohol use disorder can increase your risk for certain cancers and cause problems with your mental health, such as depression, anxiety, psychosis, delirium, and dementia. People with this disorder risk hurting themselves and others. What are the causes? This condition is caused by drinking too much alcohol over time. It is not caused by drinking too much alcohol only one or two times. Some people  with this condition drink alcohol to cope with or escape from negative life events. Others drink to relieve pain or symptoms of mental illness. What increases the risk? You are more likely to develop this condition if:  You have a family history of alcohol use disorder.  Your  culture encourages drinking to the point of intoxication, or makes alcohol easy to get.  You had a mood or conduct disorder in childhood.  You have been a victim of abuse.  You are an adolescent and: ? You have poor grades or difficulties in school. ? Your caregivers do not talk to you about saying no to alcohol, or supervise your activities. ? You are impulsive or you have trouble with self-control. What are the signs or symptoms? Symptoms of this condition include:  Drinkingmore than you want to.  Drinking for longer than you want to.  Trying several times to drink less or to control your drinking.  Spending a lot of time getting alcohol, drinking, or recovering from drinking.  Craving alcohol.  Having problems at work, at school, or at home due to drinking.  Having problems in relationships due to drinking.  Drinking when it is dangerous to drink, such as before driving a car.  Continuing to drink even though you know you might have a physical or mental problem related to drinking.  Needing more and more alcohol to get the same effect you want from the alcohol (building up tolerance).  Having symptoms of withdrawal when you stop drinking. Symptoms of withdrawal include: ? Fatigue. ? Nightmares. ? Trouble sleeping. ? Depression. ? Anxiety. ? Fever. ? Seizures. ? Severe confusion. ? Feeling or seeing things that are not there (hallucinations). ? Tremors. ? Rapid heart rate. ? Rapid breathing. ? High blood pressure.  Drinking to avoid symptoms of withdrawal. How is this diagnosed? This condition is diagnosed with an assessment. Your health care provider may start the assessment by asking three or four questions about your drinking. Your health care provider may perform a physical exam or do lab tests to see if you have physical problems resulting from alcohol use. She or he may refer you to a mental health professional for evaluation. How is this treated? Some  people with alcohol use disorder are able to reduce their alcohol use to low-risk levels. Others need to completely quit drinking alcohol. When necessary, mental health professionals with specialized training in substance use treatment can help. Your health care provider can help you decide how severe your alcohol use disorder is and what type of treatment you need. The following forms of treatment are available:  Detoxification. Detoxification involves quitting drinking and using prescription medicines within the first week to help lessen withdrawal symptoms. This treatment is important for people who have had withdrawal symptoms before and for heavy drinkers who are likely to have withdrawal symptoms. Alcohol withdrawal can be dangerous, and in severe cases, it can cause death. Detoxification may be provided in a home, community, or primary care setting, or in a hospital or substance use treatment facility.  Counseling. This treatment is also called talk therapy. It is provided by substance use treatment counselors. A counselor can address the reasons you use alcohol and suggest ways to keep you from drinking again or to prevent problem drinking. The goals of talk therapy are to: ? Find healthy activities and ways for you to cope with stress. ? Identify and avoid the things that trigger your alcohol use. ? Help you learn how to handle  cravings.  Medicines.Medicines can help treat alcohol use disorder by: ? Decreasing alcohol cravings. ? Decreasing the positive feeling you have when you drink alcohol. ? Causing an uncomfortable physical reaction when you drink alcohol (aversion therapy).  Support groups. Support groups are led by people who have quit drinking. They provide emotional support, advice, and guidance. These forms of treatment are often combined. Some people with this condition benefit from a combination of treatments provided by specialized substance use treatment centers. Follow these  instructions at home:  Take over-the-counter and prescription medicines only as told by your health care provider.  Check with your health care provider before starting any new medicines.  Ask friends and family members not to offer you alcohol.  Avoid situations where alcohol is served, including gatherings where others are drinking alcohol.  Create a plan for what to do when you are tempted to use alcohol.  Find hobbies or activities that you enjoy that do not include alcohol.  Keep all follow-up visits as told by your health care provider. This is important. How is this prevented?  If you drink, limit alcohol intake to no more than 1 drink a day for nonpregnant women and 2 drinks a day for men. One drink equals 12 oz of beer, 5 oz of wine, or 1 oz of hard liquor.  If you have a mental health condition, get treatment and support.  Do not give alcohol to adolescents.  If you are an adolescent: ? Do not drink alcohol. ? Do not be afraid to say no if someone offers you alcohol. Speak up about why you do not want to drink. You can be a positive role model for your friends and set a good example for those around you by not drinking alcohol. ? If your friends drink, spend time with others who do not drink alcohol. Make new friends who do not use alcohol. ? Find healthy ways to manage stress and emotions, such as meditation or deep breathing, exercise, spending time in nature, listening to music, or talking with a trusted friend or family member. Contact a health care provider if:  You are not able to take your medicines as told.  Your symptoms get worse.  You return to drinking alcohol (relapse) and your symptoms get worse. Get help right away if:  You have thoughts about hurting yourself or others. If you ever feel like you may hurt yourself or others, or have thoughts about taking your own life, get help right away. You can go to your nearest emergency department or call:  Your  local emergency services (911 in the [place]  A suicide crisis helpline, such as the National Suicide Prevention Lifeline at 92825050271-(269) 875-3873. This is open 24 hours a day. Summary  Alcohol use disorder is when your drinking disrupts your daily life. When you have this condition, you drink too much alcohol and you cannot control your drinking.  Treatment may include detoxification, counseling, medicine, and support groups.  Ask friends and family members not to offer you alcohol. Avoid situations where alcohol is served.  Get help right away if you have thoughts about hurting yourself or others. This information is not intended to replace advice given to you by your health care provider. Make sure you discuss any questions you have with your health care provider. Document Released: 08/16/2004 Document Revised: 04/05/2016 Document Reviewed: 04/05/2016 Elsevier Interactive Patient Education  2019 [employer]   Alcohol Abuse and Nutrition Alcohol abuse is any pattern of alcohol consumption  that harms your health, relationships, or work. Alcohol abuse can cause poor nutrition (malnutrition or malnourishment) and a lack of nutrients (nutrient deficiencies), which can lead to more complications. Alcohol abuse brings malnutrition and nutrient deficiencies in two ways:  It causes your liver to work abnormally. This affects how your body divides (breaks down) and absorbs nutrients from food.  It causes you to eat poorly. Many people who abuse alcohol do not eat enough carbohydrates, protein, fat, vitamins, and minerals. Nutrients that are commonly lacking (deficient) in people who abuse alcohol include:  Vitamins. ? Vitamin A. This is needed for your vision, metabolism, and ability to fight off infections (immunity). ? B vitamins. These include folate, thiamine, and niacin. These are needed for new cell growth. ? Vitamin C. This plays an important role in wound healing, immunity, and helping your  body to absorb iron. ? Vitamin D. This is necessary for your body to absorb and use calcium. It is produced by your liver, but you can also get it from food and from sun exposure.  Minerals. ? Calcium. This is needed for healthy bones as well as heart and blood vessel (cardiovascular) function. ? Iron. This is important for blood, muscle, and nervous system functioning. ? Magnesium. This plays an important role in muscle and nerve function, and it helps to control blood sugar and blood pressure. ? Zinc. This is important for the normal functioning of your nervous system and digestive system (gastrointestinal tract). If you think that you have an alcohol dependency problem, or if it is hard to stop drinking because you feel sick or different when you do not use alcohol, talk with your health care provider or another health professional about where to get help. Nutrition is an essential factor in therapy for alcohol abuse. Your health care provider or diet and nutrition specialist (dietitian) will work with you to design a plan that can help to restore nutrients to your body and prevent the risk of complications. What is my plan? Your dietitian may develop a specific eating plan that is based on your condition and any other problems that you have. An eating plan will commonly include:  A balanced diet. ? Grains: 6-8 oz (170-227 g) a day. Examples of 1 oz of whole grains include 1 cup of whole-wheat cereal,  cup of brown rice, or 1 slice of whole-wheat bread. ? Vegetables: 2-3 cups a day. Examples of 1 cup of vegetables include 2 medium carrots, 1 large tomato, or 2 stalks of celery. ? Fruits: 1-2 cups a day. Examples of 1 cup of fruit include 1 large banana, 1 small apple, 8 large strawberries, or 1 large orange. ? Meat and other protein: 5-6 oz (142-170 g) a day.  A cut of meat or fish that is the size of a deck of cards is about 3-4 oz.  Foods that provide 1 oz of protein include 1 egg,  cup  of nuts or seeds, or 1 tablespoon (16 g) of peanut butter. ? Dairy: 2-3 cups a day. Examples of 1 cup of dairy include 8 oz (230 mL) of milk, 8 oz (230 g) of yogurt, or 1 oz (44 g) of natural cheese.  Vitamin and mineral supplements. What are tips for following this plan?  Eat frequent meals and snacks. Try to eat 5-6 small meals each day.  Take vitamin or mineral supplements as recommended by your dietitian.  If you are malnourished or if your dietitian recommends it: ? You may follow a  high-protein, high-calorie diet. This may include:  2,000-3,000 calories (kilocalories) a day.  70-100 g (grams) of protein a day. ? You may be directed to follow a diet that includes a complete nutritional supplement beverage. This can help to restore calories, protein, and vitamins to your body. Depending on your condition, you may be advised to consume this beverage instead of your meals or in addition to them.  Certain medicines may cause changes in your appetite, taste, and weight. Work with your health care provider and dietitian to make any changes to your medicines and eating plan.  If you are unable to take in enough food and calories by mouth, your health care provider may recommend a feeding tube. This tube delivers nutritional supplements directly to your stomach. Recommended foods  Eat foods that are high in molecules that prevent oxygen from reacting with your food (antioxidants). These foods include grapes, berries, nuts, green tea, and dark green or orange vegetables. Eating these can help to prevent some of the stress that is placed on your liver by consuming alcohol.  Eat a variety of fresh fruits and vegetables each day. This will help you to get fiber and vitamins in your diet.  Drink plenty of water and other clear fluids, such as apple juice and broth. Try to drink at least 48-64 oz (1.5-2 L) of water a day.  Include foods fortified with vitamins and minerals in your diet. Commonly  fortified foods include milk, orange juice, cereal, and bread.  Eat a variety of foods that are high in omega-3 and omega-6 fatty acids. These include fish, nuts and seeds, and soybeans. These foods may help your liver to recover and may also stabilize your mood.  If you are a vegetarian: ? Eat a variety of protein-rich foods. ? Pair whole grains with plant-based proteins at meals and snack time. For example, eat rice with beans, put peanut butter on whole-grain toast, or eat oatmeal with sunflower seeds. The items listed above may not be a complete list of foods and beverages you can eat. Contact a dietitian for more information. Foods to avoid  Avoid foods and drinks that are high in fat and sugar. Sugary drinks, salty snacks, and candy contain empty calories. This means that they lack important nutrients such as protein, fiber, and vitamins.  Avoid alcohol. This is the best way to avoid malnutrition due to alcohol abuse. If you must drink, drink measured amounts. Measured drinking means limiting your intake to no more than 1 drink a day for nonpregnant women and 2 drinks a day for men. One drink equals 12 oz (355 mL) of beer, 5 oz (148 mL) of wine, or 1 oz (44 mL) of hard liquor.  Limit your intake of caffeine. Replace drinks like coffee and black tea with decaffeinated coffee and decaffeinated herbal tea. The items listed above may not be a complete list of foods and beverages you should avoid. Contact a dietitian for more information. Summary  Alcohol abuse can cause poor nutrition (malnutrition or malnourishment) and a lack of nutrients (nutrient deficiencies), which can lead to more health problems.  Common nutrient deficiencies include vitamin deficiencies (A, B, C, and D) and mineral deficiencies (calcium, iron, magnesium, and zinc).  Nutrition is an essential factor in therapy for alcohol abuse.  Your health care provider and dietitian can help you to develop a specific eating plan  that includes a balanced diet plus vitamin and mineral supplements. This information is not intended to replace advice given  to you by your health care provider. Make sure you discuss any questions you have with your health care provider. Document Released: 05/03/2005 Document Revised: 03/12/2018 Document Reviewed: 03/26/2017 Elsevier Interactive Patient Education  2019 [employer].

## 2018-12-18 NOTE — Progress Notes (Signed)
CSW received consult regarding substance use resources. Patient was seen on 5/23 and given resources. CSW reiterated these resources and added them to AVS for patient to follow up outpatient.   Osborne Casco Avrum Kimball LCSW [phone]

## 2018-12-18 NOTE — Progress Notes (Signed)
I was called into patient's room; pt was crying and hyperventilating, insisting that we "get it out, get it out" and gesturing to the IV. I removed the IV from pt's hand - catheter was intact, minimal bleeding at site, dry dressing applied. Emotional support given.

## 2018-12-18 NOTE — Discharge Summary (Signed)
Physician Discharge Summary  Natasha Pope [name]:[id number] DOB: 1996-03-24 DOA: 12/15/2018  PCP: Orland Mustard, MD  Admit date: 12/15/2018 Discharge date: 12/18/2018  Admitted From: Home Disposition:  Home with grandmother  Recommendations for Outpatient Follow-up:  1. Follow up with PCP in 1 week 2. Continue to encourage alcohol cessation 3. May need further outpatient resources regarding substance abuse versus consideration of inpatient rehab depending on her reliability.  Home Health: No Equipment/Devices: None  Discharge Condition: Stable CODE STATUS: Full code  Diet recommendation: Regular diet  History of present illness:  Victoria[place]is a23 y.o.female,with past medical history significant for asthma, severe anxiety and depression and alcoholism presenting with a 2 minutes witnessed seizure, grand mal by her grandmother. Postictal episode was noted for short period of time. No history of seizures in the past. Patient reports a long history of alcoholism that stopped around 10 days ago. No history of fever chills nausea vomiting or diarrhea.  Hospital course:  Alcohol withdrawal seizure Patient presenting to ED after being found by her grandmother following seizure-like activity.  Long history of alcohol abuse in which she stopped roughly 10 days prior to hospitalization.  EtOH level less than 10 on admission.  UDS positive for benzos.  EEG Normal.  CT Head w/o contrast without acute intracranial abnormality. Psychiatry consulted, recommended initiation of gabapentin 100 mg TID which was titrated up to 300mg  PO TID. patient was monitored in accordance with [facility] protocol.  Social work provided patient with substance abuse outpatient resources prior to discharge.  Patient will discharge on gabapentin 300 mg p.o. 3 times daily.  Patient was also given a prescription for Ativan 1 mg p.o. 3 times daily as needed with 5-day supply.  Patient was encouraged to continue  to sensate from alcohol in the future and to follow-up with substance abuse programs outpatient.  Depression/anxiety Psych recommended Prozac 20 mg daily for depression and recommended to stop Atarax.  QTC prolongation QTC noted on EKG 508.  Avoid QT prolonging medications.  Discharge Diagnoses:  Principal Problem:   Alcohol use with alcohol-induced mood disorder [facility])    Discharge Instructions  Discharge Instructions    Call MD for:  difficulty breathing, headache or visual disturbances   Complete by:  As directed    Call MD for:  persistant dizziness or light-headedness   Complete by:  As directed    Call MD for:  persistant nausea and vomiting   Complete by:  As directed    Call MD for:  severe uncontrolled pain   Complete by:  As directed    Call MD for:  temperature >100.4   Complete by:  As directed    Diet - low sodium heart healthy   Complete by:  As directed    Increase activity slowly   Complete by:  As directed      Allergies as of 12/18/2018   No Known Allergies     Medication List    STOP taking these medications   hydrOXYzine 25 MG tablet Commonly known as:  ATARAX/VISTARIL     TAKE these medications   CULTURELLE PO Take 1 capsule by mouth daily.   FLUoxetine 20 MG tablet Commonly known as:  PROZAC Take 1 tablet (20 mg total) by mouth daily.   gabapentin 300 MG capsule Commonly known as:  NEURONTIN Take 1 capsule (300 mg total) by mouth 3 (three) times daily for 30 days.   ibuprofen 200 MG tablet Commonly known as:  ADVIL Take 400-600 mg by mouth  every 6 (six) hours as needed for headache or cramping (pain).   LORazepam 1 MG tablet Commonly known as:  Ativan Take 1 tablet (1 mg total) by mouth 3 (three) times daily as needed for up to 5 days for anxiety.   multivitamin with minerals Tabs tablet Take 1 tablet by mouth daily.   norethindrone-ethinyl estradiol 1-20 MG-MCG tablet Commonly known as:  LOESTRIN FE Take 1 tablet by mouth  daily.   Potassium Chloride ER 20 MEQ Tbcr Take 20 mEq by mouth 2 (two) times daily.      Follow-up Information    Orland Mustard, MD. Schedule an appointment as soon as possible for a visit.   Specialty:  Family Medicine Contact information: [address] [place] [address] [phone]          No Known Allergies  Consultations:  Psychiatry   Procedures/Studies: Ct Head Wo Contrast  Result Date: 12/15/2018 CLINICAL DATA:  Seizure EXAM: CT HEAD WITHOUT CONTRAST TECHNIQUE: Contiguous axial images were obtained from the base of the skull through the vertex without intravenous contrast. COMPARISON:  None. FINDINGS: Brain: No evidence of acute infarction, hemorrhage, hydrocephalus, extra-axial collection or mass lesion/mass effect. Vascular: No hyperdense vessel or unexpected calcification. Skull: Normal. Negative for fracture or focal lesion. Sinuses/Orbits: No acute finding. Other: None. IMPRESSION: No acute intracranial pathology. Electronically Signed   By: Lauralyn Primes M.D.   On: 12/15/2018 15:46    EEG 12/16/2018: IMPRESSION: 1.  This is a normal recording of awake and drowsy states.     Subjective: Patient seen and examined at bedside, mild anxiety.  No other complaints at this time.  Talked with grandmother telephone regarding patient's discharge, she will be living with her in the near future as she can work from home remotely.  Encouraged grandmother to continue to encourage her granddaughter's alcohol cessation.  No other questions or complaints at this time.  No concerns overnight per nursing staff.   Discharge Exam: Vitals:   12/18/18 0650 12/18/18 0841  BP: (!) 156/108 (!) 143/101  Pulse: 100 98  Resp: 17 20  Temp:  99.1 F (37.3 C)  SpO2: 91% 99%   Vitals:   12/18/18 0056 12/18/18 0650 12/18/18 0650 12/18/18 0841  BP: (!) 145/98  (!) 156/108 (!) 143/101  Pulse: 78  100 98  Resp: Temp: 98 F (36.7 C) 98.2 F (36.8 C)  99.1 F  (37.3 C)  TempSrc: Oral Oral    SpO2: 98%  91% 99%  Weight:      Height:        General: Pt is alert, awake, not in acute distress Cardiovascular: RRR, S1/S2 +, no rubs, no gallops Respiratory: CTA bilaterally, no wheezing, no rhonchi Abdominal: Soft, NT, ND, bowel sounds + Extremities: no edema, no cyanosis    The results of significant diagnostics from this hospitalization (including imaging, microbiology, ancillary and laboratory) are listed below for reference.     Microbiology: Recent Results (from the past 240 hour(s))  SARS Coronavirus 2 (CEPHEID - Performed in [facility] hospital lab), Hosp Order     Status: None   Collection Time: 12/09/18  1:45 AM  Result Value Ref Range Status   SARS Coronavirus 2 NEGATIVE NEGATIVE Final    Comment: (NOTE) If result is NEGATIVE SARS-CoV-2 target nucleic acids are NOT DETECTED. The SARS-CoV-2 RNA is generally detectable in upper and lower  respiratory specimens during the acute phase of infection. The lowest  concentration of SARS-CoV-2  viral copies this assay can detect is 250  copies / mL. A negative result does not preclude SARS-CoV-2 infection  and should not be used as the sole basis for treatment or other  patient management decisions.  A negative result may occur with  improper specimen collection / handling, submission of specimen other  than nasopharyngeal swab, presence of viral mutation(s) within the  areas targeted by this assay, and inadequate number of viral copies  (<250 copies / mL). A negative result must be combined with clinical  observations, patient history, and epidemiological information. If result is POSITIVE SARS-CoV-2 target nucleic acids are DETECTED. The SARS-CoV-2 RNA is generally detectable in upper and lower  respiratory specimens dur ing the acute phase of infection.  Positive  results are indicative of active infection with SARS-CoV-2.  Clinical  correlation with patient history and other  diagnostic information is  necessary to determine patient infection status.  Positive results do  not rule out bacterial infection or co-infection with other viruses. If result is PRESUMPTIVE POSTIVE SARS-CoV-2 nucleic acids MAY BE PRESENT.   A presumptive positive result was obtained on the submitted specimen  and confirmed on repeat testing.  While 2019 novel coronavirus  (SARS-CoV-2) nucleic acids may be present in the submitted sample  additional confirmatory testing may be necessary for epidemiological  and / or clinical management purposes  to differentiate between  SARS-CoV-2 and other Sarbecovirus currently known to infect humans.  If clinically indicated additional testing with an alternate test  methodology [phone]) is advised. The SARS-CoV-2 RNA is generally  detectable in upper and lower respiratory sp ecimens during the acute  phase of infection. The expected result is Negative. Fact Sheet for Patients:  BoilerBrush.com.cy Fact Sheet for Healthcare Providers: https://pope.com/ This test is not yet approved or cleared by the [place] FDA and has been authorized for detection and/or diagnosis of SARS-CoV-2 by FDA under an Emergency Use Authorization (EUA).  This EUA will remain in effect (meaning this test can be used) for the duration of the COVID-19 declaration under Section 564(b)(1) of the Act, 21 U.S.C. section 360bbb-3(b)(1), unless the authorization is terminated or revoked sooner. Performed at [facility], [address] [place], [place] [address]   SARS Coronavirus 2 (CEPHEID - Performed in [facility] hospital lab), Hosp Order     Status: None   Collection Time: 12/15/18  4:22 PM  Result Value Ref Range Status   SARS Coronavirus 2 NEGATIVE NEGATIVE Final    Comment: (NOTE) If result is NEGATIVE SARS-CoV-2 target nucleic acids are NOT DETECTED. The SARS-CoV-2 RNA is generally detectable  in upper and lower  respiratory specimens during the acute phase of infection. The lowest  concentration of SARS-CoV-2 viral copies this assay can detect is 250  copies / mL. A negative result does not preclude SARS-CoV-2 infection  and should not be used as the sole basis for treatment or other  patient management decisions.  A negative result may occur with  improper specimen collection / handling, submission of specimen other  than nasopharyngeal swab, presence of viral mutation(s) within the  areas targeted by this assay, and inadequate number of viral copies  (<250 copies / mL). A negative result must be combined with clinical  observations, patient history, and epidemiological information. If result is POSITIVE SARS-CoV-2 target nucleic acids are DETECTED. The SARS-CoV-2 RNA is generally detectable in upper and lower  respiratory specimens dur ing the acute phase of infection.  Positive  results are  indicative of active infection with SARS-CoV-2.  Clinical  correlation with patient history and other diagnostic information is  necessary to determine patient infection status.  Positive results do  not rule out bacterial infection or co-infection with other viruses. If result is PRESUMPTIVE POSTIVE SARS-CoV-2 nucleic acids MAY BE PRESENT.   A presumptive positive result was obtained on the submitted specimen  and confirmed on repeat testing.  While 2019 novel coronavirus  (SARS-CoV-2) nucleic acids may be present in the submitted sample  additional confirmatory testing may be necessary for epidemiological  and / or clinical management purposes  to differentiate between  SARS-CoV-2 and other Sarbecovirus currently known to infect humans.  If clinically indicated additional testing with an alternate test  methodology [phone]) is advised. The SARS-CoV-2 RNA is generally  detectable in upper and lower respiratory sp ecimens during the acute  phase of infection. The expected result is  Negative. Fact Sheet for Patients:  BoilerBrush.com.cy Fact Sheet for Healthcare Providers: https://pope.com/ This test is not yet approved or cleared by the [place] FDA and has been authorized for detection and/or diagnosis of SARS-CoV-2 by FDA under an Emergency Use Authorization (EUA).  This EUA will remain in effect (meaning this test can be used) for the duration of the COVID-19 declaration under Section 564(b)(1) of the Act, 21 U.S.C. section 360bbb-3(b)(1), unless the authorization is terminated or revoked sooner. Performed at [facility], [address] [place], [place] [address]      Labs: BNP (last 3 results) No results for input(s): BNP in the last 8760 hours. Basic Metabolic Panel: Recent Labs  Lab 12/12/18 2017 12/15/18 1413 12/15/18 1416 12/16/18 0609  NA 143  --  137 139  K 3.1*  --  3.1* 4.2  CL 104  --  101 105  CO2 26  --  20* 21*  GLUCOSE 106*  --  153* 82  BUN 7  --  <5* <5*  CREATININE 0.49  --  0.69 0.68  CALCIUM 8.8*  --  9.1 9.6  MG  --  1.3*  --  1.8   Liver Function Tests: Recent Labs  Lab 12/15/18 1416  AST 87*  ALT 40  ALKPHOS 97  BILITOT 1.0  PROT 7.1  ALBUMIN 4.1   No results for input(s): LIPASE, AMYLASE in the last 168 hours. No results for input(s): AMMONIA in the last 168 hours. CBC: Recent Labs  Lab 12/12/18 2017 12/15/18 1413  WBC 6.4 6.9  NEUTROABS 3.1 5.0  HGB 13.7 12.8  HCT 38.6 37.1  MCV 102.4* 103.6*  PLT 137* 116*   Cardiac Enzymes: No results for input(s): CKTOTAL, CKMB, CKMBINDEX, TROPONINI in the last 168 hours. BNP: Invalid input(s): POCBNP CBG: Recent Labs  Lab 12/15/18 1426  GLUCAP 115*   D-Dimer No results for input(s): DDIMER in the last 72 hours. Hgb A1c No results for input(s): HGBA1C in the last 72 hours. Lipid Profile No results for input(s): CHOL, HDL, LDLCALC, TRIG, CHOLHDL, LDLDIRECT in the last 72 hours. Thyroid function  studies No results for input(s): TSH, T4TOTAL, T3FREE, THYROIDAB in the last 72 hours.  Invalid input(s): FREET3 Anemia work up No results for input(s): VITAMINB12, FOLATE, FERRITIN, TIBC, IRON, RETICCTPCT in the last 72 hours. Urinalysis    Component Value Date/Time   COLORURINE AMBER (A) 05/19/2018 1516   APPEARANCEUR CLOUDY (A) 05/19/2018 1516   LABSPEC 1.026 05/19/2018 1516   PHURINE 5.0 05/19/2018 1516   GLUCOSEU NEGATIVE 05/19/2018 1516   HGBUR SMALL (  A) 05/19/2018 1516   BILIRUBINUR MODERATE (A) 05/19/2018 1516   KETONESUR NEGATIVE 05/19/2018 1516   PROTEINUR >=300 (A) 05/19/2018 1516   NITRITE NEGATIVE 05/19/2018 1516   LEUKOCYTESUR MODERATE (A) 05/19/2018 1516   Sepsis Labs Invalid input(s): PROCALCITONIN,  WBC,  LACTICIDVEN Microbiology Recent Results (from the past 240 hour(s))  SARS Coronavirus 2 (CEPHEID - Performed in [facility] lab), Hosp Order     Status: None   Collection Time: 12/09/18  1:45 AM  Result Value Ref Range Status   SARS Coronavirus 2 NEGATIVE NEGATIVE Final    Comment: (NOTE) If result is NEGATIVE SARS-CoV-2 target nucleic acids are NOT DETECTED. The SARS-CoV-2 RNA is generally detectable in upper and lower  respiratory specimens during the acute phase of infection. The lowest  concentration of SARS-CoV-2 viral copies this assay can detect is 250  copies / mL. A negative result does not preclude SARS-CoV-2 infection  and should not be used as the sole basis for treatment or other  patient management decisions.  A negative result may occur with  improper specimen collection / handling, submission of specimen other  than nasopharyngeal swab, presence of viral mutation(s) within the  areas targeted by this assay, and inadequate number of viral copies  (<250 copies / mL). A negative result must be combined with clinical  observations, patient history, and epidemiological information. If result is POSITIVE SARS-CoV-2 target nucleic acids  are DETECTED. The SARS-CoV-2 RNA is generally detectable in upper and lower  respiratory specimens dur ing the acute phase of infection.  Positive  results are indicative of active infection with SARS-CoV-2.  Clinical  correlation with patient history and other diagnostic information is  necessary to determine patient infection status.  Positive results do  not rule out bacterial infection or co-infection with other viruses. If result is PRESUMPTIVE POSTIVE SARS-CoV-2 nucleic acids MAY BE PRESENT.   A presumptive positive result was obtained on the submitted specimen  and confirmed on repeat testing.  While 2019 novel coronavirus  (SARS-CoV-2) nucleic acids may be present in the submitted sample  additional confirmatory testing may be necessary for epidemiological  and / or clinical management purposes  to differentiate between  SARS-CoV-2 and other Sarbecovirus currently known to infect humans.  If clinically indicated additional testing with an alternate test  methodology [phone]([id number]) is advised. The SARS-CoV-2 RNA is generally  detectable in upper and lower respiratory sp ecimens during the acute  phase of infection. The expected result is Negative. Fact Sheet for Patients:  BoilerBrush.com.cyhttps://www.fda.gov/media/136312/download Fact Sheet for Healthcare Providers: https://pope.com/https://www.fda.gov/media/136313/download This test is not yet approved or cleared by the [place] FDA and has been authorized for detection and/or diagnosis of SARS-CoV-2 by FDA under an Emergency Use Authorization (EUA).  This EUA will remain in effect (meaning this test can be used) for the duration of the COVID-19 declaration under Section 564(b)(1) of the Act, 21 U.S.C. section 360bbb-3(b)(1), unless the authorization is terminated or revoked sooner. Performed at [facility], [address] [place], [place] [address]   SARS Coronavirus 2 (CEPHEID - Performed in [facility] hospital lab), Hosp Order      Status: None   Collection Time: 12/15/18  4:22 PM  Result Value Ref Range Status   SARS Coronavirus 2 NEGATIVE NEGATIVE Final    Comment: (NOTE) If result is NEGATIVE SARS-CoV-2 target nucleic acids are NOT DETECTED. The SARS-CoV-2 RNA is generally detectable in upper and lower  respiratory specimens during the acute phase of infection.  The lowest  concentration of SARS-CoV-2 viral copies this assay can detect is 250  copies / mL. A negative result does not preclude SARS-CoV-2 infection  and should not be used as the sole basis for treatment or other  patient management decisions.  A negative result may occur with  improper specimen collection / handling, submission of specimen other  than nasopharyngeal swab, presence of viral mutation(s) within the  areas targeted by this assay, and inadequate number of viral copies  (<250 copies / mL). A negative result must be combined with clinical  observations, patient history, and epidemiological information. If result is POSITIVE SARS-CoV-2 target nucleic acids are DETECTED. The SARS-CoV-2 RNA is generally detectable in upper and lower  respiratory specimens dur ing the acute phase of infection.  Positive  results are indicative of active infection with SARS-CoV-2.  Clinical  correlation with patient history and other diagnostic information is  necessary to determine patient infection status.  Positive results do  not rule out bacterial infection or co-infection with other viruses. If result is PRESUMPTIVE POSTIVE SARS-CoV-2 nucleic acids MAY BE PRESENT.   A presumptive positive result was obtained on the submitted specimen  and confirmed on repeat testing.  While 2019 novel coronavirus  (SARS-CoV-2) nucleic acids may be present in the submitted sample  additional confirmatory testing may be necessary for epidemiological  and / or clinical management purposes  to differentiate between  SARS-CoV-2 and other Sarbecovirus currently known to  infect humans.  If clinically indicated additional testing with an alternate test  methodology [phone]) is advised. The SARS-CoV-2 RNA is generally  detectable in upper and lower respiratory sp ecimens during the acute  phase of infection. The expected result is Negative. Fact Sheet for Patients:  BoilerBrush.com.cy Fact Sheet for Healthcare Providers: https://pope.com/ This test is not yet approved or cleared by the [place] FDA and has been authorized for detection and/or diagnosis of SARS-CoV-2 by FDA under an Emergency Use Authorization (EUA).  This EUA will remain in effect (meaning this test can be used) for the duration of the COVID-19 declaration under Section 564(b)(1) of the Act, 21 U.S.C. section 360bbb-3(b)(1), unless the authorization is terminated or revoked sooner. Performed at [facility], [address] [place], [place] [address]      Time coordinating discharge: Over 30 minutes  SIGNED:   Alvira Philips [place], DO  [facility] 12/18/2018, 10:37 AM

## 2018-12-18 NOTE — Progress Notes (Signed)
Patient complains of headache. Paged [facility] provider. See new orders.

## 2018-12-19 ENCOUNTER — Telehealth: Payer: Self-pay

## 2018-12-19 NOTE — Telephone Encounter (Signed)
Per Chart Review:  Admit date: 12/15/2018 Discharge date: 12/18/2018  Admitted From: Home Disposition:  Home with grandmother  Recommendations for Outpatient Follow-up:  1. Follow up with PCP in 1 week 2. Continue to encourage alcohol cessation 3. May need further outpatient resources regarding substance abuse versus consideration of inpatient rehab depending on her reliability.  Home Health: No Equipment/Devices: None  Discharge Condition: Stable CODE STATUS: Full code  Diet recommendation: Regular diet  History of present illness:  VictoriaWashingtonis a23 y.o.female,with past medical history significant for asthma, severe anxiety and depression and alcoholism presenting with a 2 minutes witnessed seizure, grand mal by her grandmother. Postictal episode was noted for short period of time. No history of seizures in the past. Patient reports a long history of alcoholism that stopped around 10 days ago. No history of fever chills nausea vomiting or diarrhea.  Hospital course:  Alcohol withdrawal seizure Patient presenting to EDafter being found by her grandmother following seizure-like activity. Long history of alcohol abuse in which she stopped roughly 10 days prior to hospitalization. EtOH level less than 10 on admission. UDS positive for benzos. EEGNormal. CT Head w/o contrast without acute intracranial abnormality. Psychiatry consulted, recommended initiation of gabapentin 100 mgTID which was titrated up to 300mg  PO TID. patient was monitored in accordance with [facility] protocol.  Social work provided patient with substance abuse outpatient resources prior to discharge.  Patient will discharge on gabapentin 300 mg p.o. 3 times daily.  Patient was also given a prescription for Ativan 1 mg p.o. 3 times daily as needed with 5-day supply.  Patient was encouraged to continue to sensate from alcohol in the future and to follow-up with substance abuse programs  outpatient.  Depression/anxiety Psych recommended Prozac 20 mg daily for depression and recommended to stop Atarax.  QTC prolongation QTC noted on EKG 508.  Avoid QT prolonging medications.  Discharge Diagnoses:  Principal Problem:   Alcohol use with alcohol-induced mood disorder (HCC)  ________________________________________________________________  Per Telephone Call:  Transition Care Management Follow-up Telephone Call   Date discharged?  12/18/2018   How have you been since you were released from the hospital? Spoke with patient's grandmother.  She states patient has been "ok".  States that as soon as patient got out of the hospital, she immediately went and got something to drink.  Grandmother wants Dr. Artis Flock to know that at this point she is doing "everything she can to keep the patient safe".     Do you understand why you were in the hospital? yes   Do you understand the discharge instructions? yes   Where were you discharged to? Home   Items Reviewed:  Medications reviewed: Yes.  Grandmother states patient was discharged on Ativan, gabapentin, and Prozac.  Allergies reviewed: yes  Dietary changes reviewed: yes  Referrals reviewed: yes   Functional Questionnaire:   Activities of Daily Living (ADLs):   She states they are independent in the following: ambulation, bathing and hygiene, feeding, continence, grooming, toileting and dressing States they require assistance with the following: n/a   Any transportation issues/concerns?: no   Any patient concerns? no   Confirmed importance and date/time of follow-up visits scheduled yes  Provider Appointment booked with Dr. Artis Flock on 12/24/2018 @ 10:40 am.  Confirmed with patient if condition begins to worsen call PCP or go to the ER.  Patient was given the office number and encouraged to call back with question or concerns.  : yes

## 2018-12-24 ENCOUNTER — Emergency Department (HOSPITAL_COMMUNITY)
Admission: EM | Admit: 2018-12-24 | Discharge: 2018-12-25 | Disposition: A | Discharge status: Home / Self Care | Payer: 59 | Source: Home / Self Care | Attending: Emergency Medicine | Admitting: Emergency Medicine

## 2018-12-24 ENCOUNTER — Ambulatory Visit: Payer: Self-pay

## 2018-12-24 ENCOUNTER — Encounter: Payer: Self-pay | Admitting: Family Medicine

## 2018-12-24 ENCOUNTER — Other Ambulatory Visit: Payer: Self-pay

## 2018-12-24 ENCOUNTER — Ambulatory Visit: Payer: 59 | Admitting: Family Medicine

## 2018-12-24 ENCOUNTER — Other Ambulatory Visit (HOSPITAL_COMMUNITY)
Admission: RE | Admit: 2018-12-24 | Discharge: 2018-12-24 | Disposition: A | Discharge status: Home / Self Care | Payer: 59 | Source: Ambulatory Visit | Attending: Family Medicine | Admitting: Family Medicine

## 2018-12-24 ENCOUNTER — Encounter (HOSPITAL_COMMUNITY): Payer: Self-pay | Admitting: Emergency Medicine

## 2018-12-24 VITALS — BP 120/68 | HR 101 | Temp 99.0°F | Ht 67.0 in | Wt 162.0 lb

## 2018-12-24 DIAGNOSIS — E876 Hypokalemia: Secondary | ICD-10-CM

## 2018-12-24 DIAGNOSIS — Z8659 Personal history of other mental and behavioral disorders: Secondary | ICD-10-CM

## 2018-12-24 DIAGNOSIS — R1084 Generalized abdominal pain: Secondary | ICD-10-CM | POA: Insufficient documentation

## 2018-12-24 DIAGNOSIS — G40409 Other generalized epilepsy and epileptic syndromes, not intractable, without status epilepticus: Secondary | ICD-10-CM | POA: Diagnosis not present

## 2018-12-24 DIAGNOSIS — F1910 Other psychoactive substance abuse, uncomplicated: Secondary | ICD-10-CM | POA: Insufficient documentation

## 2018-12-24 DIAGNOSIS — F411 Generalized anxiety disorder: Secondary | ICD-10-CM

## 2018-12-24 DIAGNOSIS — R112 Nausea with vomiting, unspecified: Secondary | ICD-10-CM | POA: Insufficient documentation

## 2018-12-24 DIAGNOSIS — F1010 Alcohol abuse, uncomplicated: Secondary | ICD-10-CM

## 2018-12-24 DIAGNOSIS — R1032 Left lower quadrant pain: Secondary | ICD-10-CM

## 2018-12-24 DIAGNOSIS — Z79899 Other long term (current) drug therapy: Secondary | ICD-10-CM | POA: Insufficient documentation

## 2018-12-24 DIAGNOSIS — J45909 Unspecified asthma, uncomplicated: Secondary | ICD-10-CM | POA: Insufficient documentation

## 2018-12-24 DIAGNOSIS — R748 Abnormal levels of other serum enzymes: Secondary | ICD-10-CM

## 2018-12-24 DIAGNOSIS — R569 Unspecified convulsions: Secondary | ICD-10-CM | POA: Diagnosis not present

## 2018-12-24 LAB — CBC WITH DIFFERENTIAL/PLATELET
Basophils Absolute: 0.2 10*3/uL — ABNORMAL HIGH (ref 0.0–0.1)
Basophils Relative: 2.5 % (ref 0.0–3.0)
Eosinophils Absolute: 0.1 10*3/uL (ref 0.0–0.7)
Eosinophils Relative: 1.3 % (ref 0.0–5.0)
HCT: 39.3 % (ref 36.0–46.0)
Hemoglobin: 13.2 g/dL (ref 12.0–15.0)
Lymphocytes Relative: 30 % (ref 12.0–46.0)
Lymphs Abs: 1.8 10*3/uL (ref 0.7–4.0)
MCHC: 33.6 g/dL (ref 30.0–36.0)
MCV: 105.3 fl — ABNORMAL HIGH (ref 78.0–100.0)
Monocytes Absolute: 0.5 10*3/uL (ref 0.1–1.0)
Monocytes Relative: 7.9 % (ref 3.0–12.0)
Neutro Abs: 3.5 10*3/uL (ref 1.4–7.7)
Neutrophils Relative %: 58.3 % (ref 43.0–77.0)
Platelets: 487 10*3/uL — ABNORMAL HIGH (ref 150.0–400.0)
RBC: 3.73 Mil/uL — ABNORMAL LOW (ref 3.87–5.11)
RDW: 16.4 % — ABNORMAL HIGH (ref 11.5–15.5)
WBC: 6 10*3/uL (ref 4.0–10.5)

## 2018-12-24 LAB — CBC
HCT: 40.2 % (ref 36.0–46.0)
Hemoglobin: 13.8 g/dL (ref 12.0–15.0)
MCH: 34.9 pg — ABNORMAL HIGH (ref 26.0–34.0)
MCHC: 34.3 g/dL (ref 30.0–36.0)
MCV: 101.8 fL — ABNORMAL HIGH (ref 80.0–100.0)
Platelets: 512 10*3/uL — ABNORMAL HIGH (ref 150–400)
RBC: 3.95 MIL/uL (ref 3.87–5.11)
RDW: 14.1 % (ref 11.5–15.5)
WBC: 7.5 10*3/uL (ref 4.0–10.5)
nRBC: 0 % (ref 0.0–0.2)

## 2018-12-24 LAB — POCT URINALYSIS DIPSTICK
Bilirubin, UA: NEGATIVE
Blood, UA: NEGATIVE
Glucose, UA: NEGATIVE
Ketones, UA: NEGATIVE
Leukocytes, UA: NEGATIVE
Nitrite, UA: NEGATIVE
Protein, UA: NEGATIVE
Spec Grav, UA: 1.015 (ref 1.010–1.025)
Urobilinogen, UA: 0.2 E.U./dL
pH, UA: 8 (ref 5.0–8.0)

## 2018-12-24 LAB — COMPREHENSIVE METABOLIC PANEL
ALT: 30 U/L (ref 0–35)
ALT: 37 U/L (ref 0–44)
AST: 46 U/L — ABNORMAL HIGH (ref 0–37)
AST: 56 U/L — ABNORMAL HIGH (ref 15–41)
Albumin: 4.3 g/dL (ref 3.5–5.2)
Albumin: 4.5 g/dL (ref 3.5–5.0)
Alkaline Phosphatase: 83 U/L (ref 39–117)
Alkaline Phosphatase: 83 U/L (ref 38–126)
Anion gap: 17 — ABNORMAL HIGH (ref 5–15)
BUN: 5 mg/dL — ABNORMAL LOW (ref 6–23)
BUN: <5 mg/dL — ABNORMAL LOW (ref 6–20)
CO2: 25 mEq/L (ref 19–32)
CO2: 22 mmol/L (ref 22–32)
Calcium: 9.3 mg/dL (ref 8.4–10.5)
Calcium: 9.8 mg/dL (ref 8.9–10.3)
Chloride: 100 mEq/L (ref 96–112)
Chloride: 100 mmol/L (ref 98–111)
Creatinine, Ser: 0.58 mg/dL (ref 0.40–1.20)
Creatinine, Ser: 0.71 mg/dL (ref 0.44–1.00)
GFR calc Af Amer: >60 mL/min (ref >60)
GFR calc non Af Amer: >60 mL/min (ref >60)
GFR: 129.9 mL/min (ref >60.00)
Glucose, Bld: 89 mg/dL (ref 70–99)
Glucose, Bld: 118 mg/dL — ABNORMAL HIGH (ref 70–99)
Potassium: 4 mEq/L (ref 3.5–5.1)
Potassium: 3.8 mmol/L (ref 3.5–5.1)
Sodium: 138 mEq/L (ref 135–145)
Sodium: 139 mmol/L (ref 135–145)
Total Bilirubin: 0.5 mg/dL (ref 0.2–1.2)
Total Bilirubin: 1.1 mg/dL (ref 0.3–1.2)
Total Protein: 7.1 g/dL (ref 6.0–8.3)
Total Protein: 8 g/dL (ref 6.5–8.1)

## 2018-12-24 LAB — LIPASE, BLOOD: Lipase: 21 U/L (ref 11–51)

## 2018-12-24 LAB — ETHANOL: Alcohol, Ethyl (B): 113 mg/dL — ABNORMAL HIGH (ref <10)

## 2018-12-24 MED ORDER — VITAMIN B-1 100 MG PO TABS: 100.0000 mg | ORAL_TABLET | Freq: Every day | ORAL | 3 refills | Status: DC

## 2018-12-24 MED ORDER — SODIUM CHLORIDE 0.9% FLUSH: 3.0000 mL | Freq: Once | INTRAVENOUS | Status: DC

## 2018-12-24 MED ORDER — ONDANSETRON 4 MG PO TBDP
4.0000 mg | ORAL_TABLET | Freq: Once | ORAL | Status: DC | PRN
  Filled 2018-12-24: qty 1

## 2018-12-24 MED ORDER — FOLIC ACID 1 MG PO TABS: 1.0000 mg | ORAL_TABLET | Freq: Every day | ORAL | 3 refills | Status: DC

## 2018-12-24 MED ORDER — FLUOXETINE HCL 40 MG PO CAPS: 40.0000 mg | ORAL_CAPSULE | Freq: Every day | ORAL | 1 refills | Status: DC

## 2018-12-24 NOTE — Patient Instructions (Addendum)
[facility] for [facility] treatment. Recommend an inpatient facility as this is hard to overcome.   They will be calling in regards to psychiatry referral. Increasing your prozac to 40mg /day.  Your main question should not be give me ativan, but more how can I stop drinking and please point me to the right rehab. You have to stop drinking. We can't give you ativan while you are drinking. It is not safe. Exercise and counseling best thing you can do for anxiety along with meds. Any si/hi go back to ER.   Stomach pain: checking labs for infection. If normal and not better, call me tomorrow so we can order some imaging. Will get done stat for her.    Ordered liver ultrasound as well for the fourth time to see what damage if any the alcohol has done. Liver enzymes continue to go up.

## 2018-12-24 NOTE — Telephone Encounter (Signed)
Spoke to patient's grandmother, Stevphen Rochester.  Advised to take patient to ED for evaluation w/frequent vomiting and LLQ pain.  Grandmother verbalizes understanding and will take her now.

## 2018-12-24 NOTE — ED Triage Notes (Signed)
Pt c/o abdominal pain with nausea/vomiting that started this am. Denies alcohol use.

## 2018-12-24 NOTE — Telephone Encounter (Signed)
See note

## 2018-12-24 NOTE — ED Notes (Signed)
Gearldine Shown, Lizbeth Bark, would like to be notified of updates at [phone].

## 2018-12-24 NOTE — Telephone Encounter (Signed)
Patient's grandmother called and asked to let Dr. Artis Flock know that Natasha Pope is a little worse than when she was seen. She says the patient is vomiting now, about every 10 minutes for about 30 minutes or so, green bile. She says now she's calmed down, taking pedialyte and ginger ale. I advised I will send to Dr. Artis Flock and that if the vomiting returns, she may need to be evaluated in the ED.

## 2018-12-24 NOTE — Telephone Encounter (Signed)
Needs to go to ER if vomiting that frequently and having that bad of pain.... needs evaluated.  Dr.Marja Adderley
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
## 2018-12-25 ENCOUNTER — Encounter (HOSPITAL_COMMUNITY): Payer: Self-pay | Admitting: Radiology

## 2018-12-25 ENCOUNTER — Telehealth: Payer: Self-pay | Admitting: Family Medicine

## 2018-12-25 ENCOUNTER — Emergency Department (HOSPITAL_COMMUNITY): Payer: 59

## 2018-12-25 LAB — I-STAT BETA HCG BLOOD, ED (MC, WL, AP ONLY)
I-stat hCG, quantitative: <5 m[IU]/mL (ref <5)
I-stat hCG, quantitative: <5 m[IU]/mL (ref <5)

## 2018-12-25 MED ORDER — ONDANSETRON 4 MG PO TBDP: 4.0000 mg | ORAL_TABLET | Freq: Three times a day (TID) | ORAL | 0 refills | Status: DC | PRN

## 2018-12-25 MED ORDER — IOHEXOL 300 MG/ML  SOLN
100.0000 mL | Freq: Once | INTRAMUSCULAR | Status: AC | PRN
  Administered 2018-12-25: 100 mL via INTRAVENOUS

## 2018-12-25 MED ORDER — PROMETHAZINE HCL 25 MG/ML IJ SOLN
12.5000 mg | Freq: Once | INTRAMUSCULAR | Status: AC
  Administered 2018-12-25: 12.5 mg via INTRAVENOUS
  Filled 2018-12-25: qty 1

## 2018-12-25 MED ORDER — SODIUM CHLORIDE 0.9 % IV BOLUS
1000.0000 mL | Freq: Once | INTRAVENOUS | Status: AC
  Administered 2018-12-25: 1000 mL via INTRAVENOUS

## 2018-12-25 NOTE — Discharge Instructions (Signed)
Take Zofran as directed for nausea. Push fluids and advance diet as tolerated.   Follow up with your doctor for recheck in the next 2-3 days.

## 2018-12-25 NOTE — Telephone Encounter (Signed)
Copied from CRM [id number]. Topic: General - Inquiry >> Dec 25, 2018  3:32 PM Reggie Pile, NT wrote: Patient is calling in to inform Dr.Wolfe that her stomach pain is getting better. Patient was also in the ED and discharged with fluids.

## 2018-12-25 NOTE — ED Provider Notes (Signed)
[facility] EMERGENCY DEPARTMENT Provider Note   CSN: [id number] Arrival date & time: 12/24/18  1706    History   Chief Complaint Chief Complaint  Patient presents with  . Abdominal Pain    HPI Natasha Pope is a 23 y.o. female.     Patient with a history of alcohol abuse, anxiety/panic, asthma presents with complaint of generalized abdominal pain, nausea and vomiting that started this morning (16 hours ago). No fever, cough. She reports 2-3 episodes of diarrhea and TNTC emesis, both non-bloody. She was seen by her doctor earlier today. Note reviewed. She denies vaginal discharge, irregular bleeding, dysuria or urinary frequency. She is now having symptoms of chest tightness and SOB c/w her panic and anxiety attacks. She takes Prozac but reports this is not helping. No cough or chest pain.   The history is provided by the patient and medical records. No language interpreter was used.  Abdominal Pain  Associated symptoms: diarrhea, nausea, shortness of breath and vomiting   Associated symptoms: no chills and no fever     Past Medical History:  Diagnosis Date  . Anxiety   . Asthma   . Depression   . Pyelonephritis   . Sepsis [facility])     Patient Active Problem List   Diagnosis Date Noted  . Alcohol use with alcohol-induced mood disorder (HCC)   . Substance induced mood disorder (HCC) 12/09/2018  . Alcohol abuse 12/09/2018  . Acute pyelonephritis 05/19/2018  . Hypokalemia 05/19/2018  . Hypocalcemia 05/19/2018  . Hypomagnesemia 05/19/2018  . Macrocytic anemia 05/19/2018  . Electrolyte disturbance 05/19/2018  . Thrombocytopenia (HCC) 05/19/2018  . Alcohol use 05/19/2018  . ASCUS of cervix with negative high risk HPV 03/07/2018  . Tobacco abuse 01/31/2018  . Asthma 01/31/2018  . GAD (generalized anxiety disorder) 01/31/2018    Past Surgical History:  Procedure Laterality Date  . dental procedure       OB History   No obstetric history on  file.      Home Medications    Prior to Admission medications   Medication Sig Start Date End Date Taking? Authorizing Provider  FLUoxetine (PROZAC) 40 MG capsule Take 1 capsule (40 mg total) by mouth daily. 12/24/18   Orland Mustard, MD  folic acid (FOLVITE) 1 MG tablet Take 1 tablet (1 mg total) by mouth daily. 12/24/18   Orland Mustard, MD  gabapentin (NEURONTIN) 300 MG capsule Take 1 capsule (300 mg total) by mouth 3 (three) times daily for 30 days. 12/18/18 01/17/19  [place], Alvira Philips, DO  ibuprofen (ADVIL) 200 MG tablet Take 400-600 mg by mouth every 6 (six) hours as needed for headache or cramping (pain).    [provider]  Lactobacillus Rhamnosus, GG, (CULTURELLE PO) Take 1 capsule by mouth daily.    [provider]  Multiple Vitamin (MULTIVITAMIN WITH MINERALS) TABS tablet Take 1 tablet by mouth daily.    [provider]  norethindrone-ethinyl estradiol (JUNEL FE,GILDESS FE,LOESTRIN FE) 1-20 MG-MCG tablet Take 1 tablet by mouth daily. 09/17/18   Orland Mustard, MD  Potassium Chloride ER 20 MEQ TBCR Take 20 mEq by mouth 2 (two) times daily.     [provider]  thiamine (VITAMIN B-1) 100 MG tablet Take 1 tablet (100 mg total) by mouth daily. 12/24/18   Orland Mustard, MD    Family History Family History  Problem Relation Age of Onset  . Depression Mother   . Miscarriages / [place] Mother   . Hypertension Father   .  Alcohol abuse Father   . Learning disabilities Sister   . Hyperlipidemia Maternal Grandmother   . Alcohol abuse Maternal Grandfather   . Alcohol abuse Paternal Grandmother   . Hypertension Paternal Grandfather     Social History Social History   Tobacco Use  . Smoking status: Former Smoker    Packs/day: 0.50    Types: Cigarettes    Last attempt to quit: 05/10/2018    Years since quitting: 0.6  . Smokeless tobacco: Never Used  Substance Use Topics  . Alcohol use: Yes    Comment: 2-3 days/week  . Drug use: No      Allergies   Patient has no known allergies.   Review of Systems Review of Systems  Constitutional: Negative for chills and fever.  HENT: Negative.   Respiratory: Positive for chest tightness and shortness of breath.        C/w anxiety and panic  Cardiovascular: Negative.   Gastrointestinal: Positive for abdominal pain, diarrhea, nausea and vomiting. Negative for blood in stool.  Genitourinary: Negative.   Musculoskeletal: Negative.   Skin: Negative.   Neurological: Negative.      Physical Exam Updated Vital Signs BP (!) 153/107   Pulse 90   Temp 99.2 F (37.3 C) (Oral)   Resp 20   Ht 5\' 6"  (1.676 m)   Wt 72.6 kg   LMP 12/04/2018 (Exact Date)   SpO2 100%   BMI 25.82 kg/m   Physical Exam Vitals signs and nursing note reviewed.  Constitutional:      Appearance: She is well-developed.  HENT:     Head: Normocephalic.  Neck:     Musculoskeletal: Normal range of motion and neck supple.  Cardiovascular:     Rate and Rhythm: Normal rate and regular rhythm.     Heart sounds: No murmur.  Pulmonary:     Effort: Pulmonary effort is normal.     Breath sounds: Normal breath sounds. No wheezing, rhonchi or rales.  Abdominal:     General: Bowel sounds are normal.     Palpations: Abdomen is soft.     Tenderness: There is generalized abdominal tenderness. There is no guarding or rebound.  Musculoskeletal: Normal range of motion.  Skin:    General: Skin is warm and dry.     Findings: No rash.  Neurological:     General: No focal deficit present.     Mental Status: She is alert and oriented to person, place, and time.  Psychiatric:        Attention and Perception: Attention normal.        Mood and Affect: Mood is anxious.        Speech: Speech normal.        Behavior: Behavior normal.      ED Treatments / Results  Labs (all labs ordered are listed, but only abnormal results are displayed) Labs Reviewed  COMPREHENSIVE METABOLIC PANEL - Abnormal; Notable for the  following components:      Result Value   Glucose, Bld 118 (*)    BUN <5 (*)    AST 56 (*)    Anion gap 17 (*)    All other components within normal limits  CBC - Abnormal; Notable for the following components:   MCV 101.8 (*)    MCH 34.9 (*)    Platelets 512 (*)    All other components within normal limits  ETHANOL - Abnormal; Notable for the following components:   Alcohol, Ethyl (B) 113 (*)  All other components within normal limits  LIPASE, BLOOD  URINALYSIS, ROUTINE W REFLEX MICROSCOPIC  I-STAT BETA HCG BLOOD, ED ([facility], [facility], [facility] ONLY)    EKG None  Radiology No results found.  Procedures Procedures (including critical care time)  Medications Ordered in ED Medications  sodium chloride flush (NS) 0.9 % injection 3 mL (has no administration in time range)  ondansetron (ZOFRAN-ODT) disintegrating tablet 4 mg (has no administration in time range)  promethazine (PHENERGAN) injection 12.5 mg (has no administration in time range)  sodium chloride 0.9 % bolus 1,000 mL (has no administration in time range)     Initial Impression / Assessment and Plan / ED Course  I have reviewed the triage vital signs and the nursing notes.  Pertinent labs & imaging results that were available during my care of the patient were reviewed by me and considered in my medical decision making (see chart for details).        Patient to ED with abdominal pain, N, V, D without fever x 16 hours. Seen by her PCP on the morning symptoms began and she states she was told to come to the ED is symptoms continued.   Here the patient c/w generalized abdominal pain, continued nausea/vomiting and has nothing at home to take for symptoms. She is also now having chest tightness, restlessness and SOB c/w her frequent anxiety and panic attacks.   IV started and IVF's provided. IV  Phenergan also given. Chart reviewed. A CT scan was considered by PCP if symptoms persisted. CT ordered in the ED, however, the  patient refused the study once in the radiology suite "because of my anxiety".   The patient is resting calmly on re-examination. Eyes closed, quiet, not restless. Respiratory rate normal. Emesis bag empty.   She is felt appropriate for discharge home. She continues to state she is in a panic but appears calm. She has a history of substance abuse and alcohol dependence. She denies drinking prior to arrival, but alcohol level was elevated. I do not feel comfortable providing benzos in this situation. She is encouraged to continue her Prozac and see her outpatient resources for further management. Rx Zofran provided.   Final Clinical Impressions(s) / ED Diagnoses   Final diagnoses:  None   1. Generalized abdominal pain 2. History of anxiety and panic 3. Substance abuse  ED Discharge Orders    None       Elpidio AnisUpstill, Nicie Milan, Cordelia Poche-C 12/25/18 0319    Mesner, Barbara CowerJason, MD 12/25/18 [phone]

## 2018-12-26 ENCOUNTER — Encounter (HOSPITAL_COMMUNITY): Payer: Self-pay

## 2018-12-26 ENCOUNTER — Emergency Department (HOSPITAL_COMMUNITY): Payer: 59

## 2018-12-26 ENCOUNTER — Ambulatory Visit: Payer: Self-pay | Admitting: *Deleted

## 2018-12-26 ENCOUNTER — Telehealth: Payer: Self-pay | Admitting: Family Medicine

## 2018-12-26 ENCOUNTER — Inpatient Hospital Stay (HOSPITAL_COMMUNITY)
Admission: EM | Admit: 2018-12-26 | Discharge: 2018-12-27 | DRG: 101 | Disposition: A | Discharge status: Home / Self Care | Payer: 59 | Attending: Family Medicine | Admitting: Family Medicine

## 2018-12-26 ENCOUNTER — Other Ambulatory Visit: Payer: Self-pay

## 2018-12-26 DIAGNOSIS — G4700 Insomnia, unspecified: Secondary | ICD-10-CM | POA: Diagnosis present

## 2018-12-26 DIAGNOSIS — I10 Essential (primary) hypertension: Secondary | ICD-10-CM | POA: Diagnosis present

## 2018-12-26 DIAGNOSIS — Z79899 Other long term (current) drug therapy: Secondary | ICD-10-CM | POA: Diagnosis not present

## 2018-12-26 DIAGNOSIS — Z87891 Personal history of nicotine dependence: Secondary | ICD-10-CM

## 2018-12-26 DIAGNOSIS — R569 Unspecified convulsions: Secondary | ICD-10-CM

## 2018-12-26 DIAGNOSIS — Z20828 Contact with and (suspected) exposure to other viral communicable diseases: Secondary | ICD-10-CM | POA: Diagnosis present

## 2018-12-26 DIAGNOSIS — F319 Bipolar disorder, unspecified: Secondary | ICD-10-CM | POA: Diagnosis present

## 2018-12-26 DIAGNOSIS — G40409 Other generalized epilepsy and epileptic syndromes, not intractable, without status epilepticus: Principal | ICD-10-CM | POA: Diagnosis present

## 2018-12-26 DIAGNOSIS — R000 Tachycardia, unspecified: Secondary | ICD-10-CM | POA: Diagnosis present

## 2018-12-26 DIAGNOSIS — Z818 Family history of other mental and behavioral disorders: Secondary | ICD-10-CM

## 2018-12-26 DIAGNOSIS — Z811 Family history of alcohol abuse and dependence: Secondary | ICD-10-CM

## 2018-12-26 DIAGNOSIS — F1021 Alcohol dependence, in remission: Secondary | ICD-10-CM

## 2018-12-26 DIAGNOSIS — E876 Hypokalemia: Secondary | ICD-10-CM | POA: Diagnosis present

## 2018-12-26 DIAGNOSIS — F1020 Alcohol dependence, uncomplicated: Secondary | ICD-10-CM | POA: Diagnosis present

## 2018-12-26 DIAGNOSIS — F411 Generalized anxiety disorder: Secondary | ICD-10-CM | POA: Diagnosis present

## 2018-12-26 DIAGNOSIS — Y905 Blood alcohol level of 100-119 mg/100 ml: Secondary | ICD-10-CM | POA: Diagnosis present

## 2018-12-26 DIAGNOSIS — F05 Delirium due to known physiological condition: Secondary | ICD-10-CM

## 2018-12-26 DIAGNOSIS — F410 Panic disorder [episodic paroxysmal anxiety] without agoraphobia: Secondary | ICD-10-CM | POA: Diagnosis present

## 2018-12-26 DIAGNOSIS — F339 Major depressive disorder, recurrent, unspecified: Secondary | ICD-10-CM

## 2018-12-26 LAB — BASIC METABOLIC PANEL
Anion gap: 10 (ref 5–15)
BUN: 6 mg/dL (ref 6–20)
CO2: 25 mmol/L (ref 22–32)
Calcium: 9.9 mg/dL (ref 8.9–10.3)
Chloride: 102 mmol/L (ref 98–111)
Creatinine, Ser: 0.59 mg/dL (ref 0.44–1.00)
GFR calc Af Amer: >60 mL/min (ref >60)
GFR calc non Af Amer: >60 mL/min (ref >60)
Glucose, Bld: 95 mg/dL (ref 70–99)
Potassium: 3.4 mmol/L — ABNORMAL LOW (ref 3.5–5.1)
Sodium: 137 mmol/L (ref 135–145)

## 2018-12-26 LAB — URINE CYTOLOGY ANCILLARY ONLY
Chlamydia: NEGATIVE
Neisseria Gonorrhea: NEGATIVE
Trichomonas: NEGATIVE

## 2018-12-26 LAB — ETHANOL: Alcohol, Ethyl (B): <10 mg/dL (ref <10)

## 2018-12-26 LAB — CBC
HCT: 42.8 % (ref 36.0–46.0)
Hemoglobin: 13.9 g/dL (ref 12.0–15.0)
MCH: 34.5 pg — ABNORMAL HIGH (ref 26.0–34.0)
MCHC: 32.5 g/dL (ref 30.0–36.0)
MCV: 106.2 fL — ABNORMAL HIGH (ref 80.0–100.0)
Platelets: 450 10*3/uL — ABNORMAL HIGH (ref 150–400)
RBC: 4.03 MIL/uL (ref 3.87–5.11)
RDW: 14.3 % (ref 11.5–15.5)
WBC: 7.5 10*3/uL (ref 4.0–10.5)
nRBC: 0.3 % — ABNORMAL HIGH (ref 0.0–0.2)

## 2018-12-26 LAB — URINALYSIS, ROUTINE W REFLEX MICROSCOPIC
Bacteria, UA: NONE SEEN
Bilirubin Urine: NEGATIVE
Glucose, UA: NEGATIVE mg/dL
Ketones, ur: 20 mg/dL — AB
Nitrite: NEGATIVE
Protein, ur: 30 mg/dL — AB
Specific Gravity, Urine: 1.017 (ref 1.005–1.030)
pH: 6 (ref 5.0–8.0)

## 2018-12-26 LAB — RAPID URINE DRUG SCREEN, HOSP PERFORMED
Amphetamines: NOT DETECTED
Barbiturates: NOT DETECTED
Benzodiazepines: POSITIVE — AB
Cocaine: NOT DETECTED
Opiates: NOT DETECTED
Tetrahydrocannabinol: NOT DETECTED

## 2018-12-26 LAB — I-STAT BETA HCG BLOOD, ED (NOT ORDERABLE): I-stat hCG, quantitative: <5 m[IU]/mL (ref <5)

## 2018-12-26 LAB — GLUCOSE, CAPILLARY: Glucose-Capillary: 89 mg/dL (ref 70–99)

## 2018-12-26 LAB — SARS CORONAVIRUS 2 BY RT PCR (HOSPITAL ORDER, PERFORMED IN ~~LOC~~ HOSPITAL LAB): SARS Coronavirus 2: NEGATIVE

## 2018-12-26 LAB — TROPONIN I: Troponin I: <0.03 ng/mL (ref <0.03)

## 2018-12-26 MED ORDER — ENOXAPARIN SODIUM 40 MG/0.4ML ~~LOC~~ SOLN
40.0000 mg | SUBCUTANEOUS | Status: DC
  Filled 2018-12-26: qty 0.4

## 2018-12-26 MED ORDER — LEVETIRACETAM 500 MG PO TABS
1000.0000 mg | ORAL_TABLET | Freq: Two times a day (BID) | ORAL | Status: DC
  Administered 2018-12-26 – 2018-12-27 (×2): 1000 mg via ORAL
  Filled 2018-12-26 (×2): qty 2

## 2018-12-26 MED ORDER — HYDRALAZINE HCL 20 MG/ML IJ SOLN: 5.0000 mg | INTRAMUSCULAR | Status: DC | PRN

## 2018-12-26 MED ORDER — GABAPENTIN 300 MG PO CAPS
300.0000 mg | ORAL_CAPSULE | Freq: Three times a day (TID) | ORAL | Status: DC
  Administered 2018-12-26: 300 mg via ORAL
  Filled 2018-12-26: qty 1

## 2018-12-26 MED ORDER — LEVETIRACETAM IN NACL 1000 MG/100ML IV SOLN
1000.0000 mg | Freq: Once | INTRAVENOUS | Status: AC
  Administered 2018-12-26: 15:00:00 1000 mg via INTRAVENOUS
  Filled 2018-12-26: qty 100

## 2018-12-26 MED ORDER — VITAMIN B-1 100 MG PO TABS
100.0000 mg | ORAL_TABLET | Freq: Every day | ORAL | Status: DC
  Administered 2018-12-27: 100 mg via ORAL
  Filled 2018-12-26: qty 1

## 2018-12-26 MED ORDER — ADULT MULTIVITAMIN W/MINERALS CH
1.0000 | ORAL_TABLET | Freq: Every day | ORAL | Status: DC
  Administered 2018-12-27: 1 via ORAL
  Filled 2018-12-26 (×2): qty 1

## 2018-12-26 MED ORDER — ONDANSETRON 4 MG PO TBDP: 4.0000 mg | ORAL_TABLET | Freq: Four times a day (QID) | ORAL | Status: DC | PRN

## 2018-12-26 MED ORDER — ADULT MULTIVITAMIN W/MINERALS CH: 1.0000 | ORAL_TABLET | Freq: Every day | ORAL | Status: DC

## 2018-12-26 MED ORDER — IBUPROFEN 200 MG PO TABS
400.0000 mg | ORAL_TABLET | Freq: Four times a day (QID) | ORAL | Status: DC | PRN
  Administered 2018-12-26: 600 mg via ORAL
  Filled 2018-12-26: qty 3

## 2018-12-26 MED ORDER — NORETHIN ACE-ETH ESTRAD-FE 1-20 MG-MCG PO TABS: 1.0000 | ORAL_TABLET | Freq: Every day | ORAL | Status: DC

## 2018-12-26 MED ORDER — METOPROLOL SUCCINATE ER 25 MG PO TB24
25.0000 mg | ORAL_TABLET | Freq: Every day | ORAL | Status: DC
  Administered 2018-12-26 – 2018-12-27 (×2): 25 mg via ORAL
  Filled 2018-12-26 (×2): qty 1

## 2018-12-26 MED ORDER — CHLORDIAZEPOXIDE HCL 25 MG PO CAPS: 25.0000 mg | ORAL_CAPSULE | Freq: Every day | ORAL | Status: DC

## 2018-12-26 MED ORDER — CHLORHEXIDINE GLUCONATE CLOTH 2 % EX PADS
6.0000 | MEDICATED_PAD | Freq: Every day | CUTANEOUS | Status: DC
  Administered 2018-12-26: 6 via TOPICAL

## 2018-12-26 MED ORDER — FLUOXETINE HCL 20 MG PO CAPS
40.0000 mg | ORAL_CAPSULE | Freq: Every day | ORAL | Status: DC
  Administered 2018-12-27: 40 mg via ORAL
  Filled 2018-12-26: qty 2

## 2018-12-26 MED ORDER — CHLORDIAZEPOXIDE HCL 25 MG PO CAPS
25.0000 mg | ORAL_CAPSULE | Freq: Four times a day (QID) | ORAL | Status: DC
  Administered 2018-12-26 (×2): 25 mg via ORAL
  Filled 2018-12-26 (×2): qty 1

## 2018-12-26 MED ORDER — CHLORDIAZEPOXIDE HCL 25 MG PO CAPS: 25.0000 mg | ORAL_CAPSULE | Freq: Three times a day (TID) | ORAL | Status: DC

## 2018-12-26 MED ORDER — POTASSIUM CHLORIDE CRYS ER 20 MEQ PO TBCR
20.0000 meq | EXTENDED_RELEASE_TABLET | Freq: Two times a day (BID) | ORAL | Status: DC
  Administered 2018-12-26: 20 meq via ORAL
  Filled 2018-12-26: qty 1

## 2018-12-26 MED ORDER — CHLORDIAZEPOXIDE HCL 25 MG PO CAPS: 25.0000 mg | ORAL_CAPSULE | ORAL | Status: DC

## 2018-12-26 MED ORDER — HYDROXYZINE HCL 25 MG PO TABS: 25.0000 mg | ORAL_TABLET | Freq: Four times a day (QID) | ORAL | Status: DC | PRN

## 2018-12-26 MED ORDER — LOPERAMIDE HCL 2 MG PO CAPS: 2.0000 mg | ORAL_CAPSULE | ORAL | Status: DC | PRN

## 2018-12-26 MED ORDER — THIAMINE HCL 100 MG/ML IJ SOLN
100.0000 mg | Freq: Once | INTRAMUSCULAR | Status: AC
  Administered 2018-12-26: 100 mg via INTRAMUSCULAR
  Filled 2018-12-26: qty 2

## 2018-12-26 MED ORDER — CHLORDIAZEPOXIDE HCL 25 MG PO CAPS: 25.0000 mg | ORAL_CAPSULE | Freq: Four times a day (QID) | ORAL | Status: DC | PRN

## 2018-12-26 NOTE — ED Notes (Signed)
Pt eating will bring pt up shortly to 2W.

## 2018-12-26 NOTE — ED Notes (Signed)
Pt is alert and oriented x 4 and is verbally responsive. Pt is anxious. Pt reports rt sided head pain 10/10, with sternal cp. Pt reports associated dizziness. Pt does report that she stopped taking Neurontin a week a go as she did not like the way it made her feel Pt also reports that she is on Prozac and was on 20mg  and was recently increased to 40 mg .

## 2018-12-26 NOTE — ED Notes (Signed)
Pt adds that she has chest pain and can't breathe. EKG performed

## 2018-12-26 NOTE — Telephone Encounter (Signed)
See note

## 2018-12-26 NOTE — Telephone Encounter (Signed)
Grandmother is calling to report patient has had another seizure- she heard her fall in the bathroom and found her seizing. Patient is awake and aware of surroundings but complaining of chest pain. Grandmother is taking her to hospital to have her checked- she wants PCP to know.  Reason for Disposition . Stopped taking seizure medicine (anticonvulsant)  Answer Assessment - Initial Assessment Questions 1. ONSET: "How long did the seizure last?" (Minutes)      1 1/2 minute 2. CONTENT: "Describe what happened during the seizure. Did the body become stiff? Was there any jerking?"      Heard fall- hands rolled up, body stiff, unconscious, shaking  3. CIRCUMSTANCE: "What was the individual doing when the seizure began?"      Starting to take shower 4. MENTAL STATUS: "Does he know who he is, who you are, and where he is?"      Patient is aware 5. PRIOR SEIZURES: "Has the individual had a seizure (convulsion) before?" If so, ask: "When was the last time?" and "What happened last time?"     She had one 1 1/2 weeks ago-chest 6. EPILEPSY: "Does the individual have epilepsy?" (note: check for medical ID bracelet)     no 7. MEDICATIONS: "Does the individual take anticonvulsant medications?" (e.g., yes/no, compliance, any recent changes)     no 8. INJURY: "Did the individual hurt himself during the seizure?" (e.g., head, tongue)     Patient fell- grandmother heard her fall 9. OTHER SYMPTOMS: "Are there any other symptoms?" (e.g., fever, headache)     Chest pain 10. PREGNANCY: "Is there any chance you are pregnant?" "When was your last menstrual period?"       Not asked  Protocols used: [facility]

## 2018-12-26 NOTE — Telephone Encounter (Signed)
First of all as discussed in the appointment we stopped the gabapentin when I saw her and talked about it with her during her appointment. [name] didn't like how it made her feel and didn't think it helped her. Second, I would do what they are telling her in the ER.   Orland Mustard, MD [facility]

## 2018-12-26 NOTE — ED Triage Notes (Signed)
Pt states she had a seizure lasting 1-2 minutes approximately 20 minutes ago. Pt states she now has a headache and is sore all over. Pt states she has not had a seizure before.

## 2018-12-26 NOTE — ED Provider Notes (Signed)
[facility]-EMERGENCY DEPT Provider Note   CSN: [id number] Arrival date & time: 12/26/18  1101    History   Chief Complaint Chief Complaint  Patient presents with   Seizures    HPI Natasha Pope is a 23 y.o. female.    Level 5 caveat due to altered mental status. HPI Patient reportedly brought in for another seizure.  Had a first seizure a week and a half ago and was admitted to the hospital.  Head CT reassuring at that time.  Thought may be due to her alcohol use/abuse.  Reportedly had another seizure today.  Reportedly had tonic-clonic seizure.  Although patient states she has never had a seizure before.  Is not quite remember the events.  Also states she does not drink alcohol when patient does drink alcohol heavily.  Was seen in the ER yesterday although patient does not claim to remember this.  Had alcohol level that was elevated at that time.  Complaining of headache on the right side of her head.  Patient had stopped her Neurontin recently.  States she did not feel well on it and discussed with the PCP.  Had also reportedly told the nurse earlier that she was short of breath. Past Medical History:  Diagnosis Date   Anxiety    Asthma    Depression    Pyelonephritis    Sepsis [facility])     Patient Active Problem List   Diagnosis Date Noted   Alcohol use with alcohol-induced mood disorder (HCC)    Substance induced mood disorder (HCC) 12/09/2018   Alcohol abuse 12/09/2018   Acute pyelonephritis 05/19/2018   Hypokalemia 05/19/2018   Hypocalcemia 05/19/2018   Hypomagnesemia 05/19/2018   Macrocytic anemia 05/19/2018   Electrolyte disturbance 05/19/2018   Thrombocytopenia (HCC) 05/19/2018   Alcohol use 05/19/2018   ASCUS of cervix with negative high risk HPV 03/07/2018   Tobacco abuse 01/31/2018   Asthma 01/31/2018   GAD (generalized anxiety disorder) 01/31/2018    Past Surgical History:  Procedure Laterality Date    dental procedure       OB History   No obstetric history on file.      Home Medications    Prior to Admission medications   Medication Sig Start Date End Date Taking? Authorizing Provider  FLUoxetine (PROZAC) 40 MG capsule Take 1 capsule (40 mg total) by mouth daily. 12/24/18  Yes Orland Mustard, MD  gabapentin (NEURONTIN) 300 MG capsule Take 1 capsule (300 mg total) by mouth 3 (three) times daily for 30 days. 12/18/18 01/17/19 Yes [place], Eric J, DO  ibuprofen (ADVIL) 200 MG tablet Take 400-600 mg by mouth every 6 (six) hours as needed for headache or cramping (pain).   Yes [provider]  Lactobacillus Rhamnosus, GG, (CULTURELLE PO) Take 1 capsule by mouth daily.   Yes [provider]  Multiple Vitamin (MULTIVITAMIN WITH MINERALS) TABS tablet Take 1 tablet by mouth daily.   Yes [provider]  norethindrone-ethinyl estradiol (JUNEL FE,GILDESS FE,LOESTRIN FE) 1-20 MG-MCG tablet Take 1 tablet by mouth daily. 09/17/18  Yes Orland Mustard, MD  Potassium Chloride ER 20 MEQ TBCR Take 20 mEq by mouth 2 (two) times daily.    Yes [provider]  folic acid (FOLVITE) 1 MG tablet Take 1 tablet (1 mg total) by mouth daily. Patient not taking: Reported on 12/26/2018 12/24/18   Orland Mustard, MD  ondansetron (ZOFRAN ODT) 4 MG disintegrating tablet Take 1 tablet (4 mg total) by mouth every 8 (  eight) hours as needed for nausea or vomiting. Patient not taking: Reported on 12/26/2018 12/25/18   Elpidio AnisUpstill, Shari, PA-C  thiamine (VITAMIN B-1) 100 MG tablet Take 1 tablet (100 mg total) by mouth daily. Patient not taking: Reported on 12/26/2018 12/24/18   Orland MustardWolfe, Allison, MD    Family History Family History  Problem Relation Age of Onset   Depression Mother    Miscarriages / [place] Mother    Hypertension Father    Alcohol abuse Father    Learning disabilities Sister    Hyperlipidemia Maternal Grandmother    Alcohol abuse Maternal Grandfather    Alcohol abuse  Paternal Grandmother    Hypertension Paternal Grandfather     Social History Social History   Tobacco Use   Smoking status: Former Smoker    Packs/day: 0.50    Types: Cigarettes    Last attempt to quit: 05/10/2018    Years since quitting: 0.6   Smokeless tobacco: Never Used  Substance Use Topics   Alcohol use: Yes    Comment: 2-3 days/week   Drug use: No     Allergies   Patient has no known allergies.   Review of Systems Review of Systems  Unable to perform ROS: Mental status change     Physical Exam Updated Vital Signs BP (!) 153/118 (BP Location: Left Arm)    Pulse 79    Temp 98.4 F (36.9 C) (Oral)    Resp 15    Ht 5\' 6"  (1.676 m)    Wt 73 kg    LMP 12/04/2018 (Exact Date) Comment: neg hcg   SpO2 100%    BMI 25.98 kg/m   Physical Exam Vitals signs reviewed.  HENT:     Head:     Comments: Mild tenderness to right temporal area. Eyes:     Pupils: Pupils are equal, round, and reactive to light.  Neck:     Musculoskeletal: Neck supple.  Cardiovascular:     Comments: Mild tachycardia Pulmonary:     Effort: Pulmonary effort is normal.     Breath sounds: No wheezing or rhonchi.  Abdominal:     Tenderness: There is no abdominal tenderness.  Skin:    General: Skin is warm.     Capillary Refill: Capillary refill takes less than 2 seconds.  Neurological:     Mental Status: She is alert.     Comments: Awake and pleasant but appears to have some confusion.      ED Treatments / Results  Labs (all labs ordered are listed, but only abnormal results are displayed) Labs Reviewed  BASIC METABOLIC PANEL - Abnormal; Notable for the following components:      Result Value   Potassium 3.4 (*)    All other components within normal limits  CBC - Abnormal; Notable for the following components:   MCV 106.2 (*)    MCH 34.5 (*)    Platelets 450 (*)    nRBC 0.3 (*)    All other components within normal limits  SARS CORONAVIRUS 2 (HOSPITAL ORDER, PERFORMED IN  [facility])  TROPONIN I  GLUCOSE, CAPILLARY  ETHANOL  URINALYSIS, ROUTINE W REFLEX MICROSCOPIC  RAPID URINE DRUG SCREEN, HOSP PERFORMED  CBG MONITORING, ED  I-STAT BETA HCG BLOOD, ED ([facility], [facility], [facility] ONLY)  I-STAT BETA HCG BLOOD, ED ([facility], [facility], [facility] ONLY)  I-STAT BETA HCG BLOOD, ED (NOT ORDERABLE)    EKG EKG Interpretation  Date/Time:  Friday December 26 2018 11:19:38 EDT Ventricular Rate:  108  PR Interval:    QRS Duration: 86 QT Interval:  369 QTC Calculation: 495 R Axis:   93 Text Interpretation:  Sinus tachycardia Borderline right axis deviation Borderline prolonged QT interval Confirmed by Benjiman Core [id number]) on 12/26/2018 12:58:00 PM   Radiology Dg Chest 2 View  Result Date: 12/26/2018 CLINICAL DATA:  Seizure for 1-2 minutes in the shower this morning, fall, headache, sore all over, central chest pain and shortness of breath EXAM: CHEST - 2 VIEW COMPARISON:  11/15/2018 FINDINGS: Normal heart size, mediastinal contours, and pulmonary vascularity. Lungs clear. No pleural effusion or pneumothorax. Bones unremarkable. IMPRESSION: Normal exam. Electronically Signed   By: Ulyses Southward M.D.   On: 12/26/2018 11:41   Ct Head Wo Contrast  Result Date: 12/26/2018 CLINICAL DATA:  Seizure with headache EXAM: CT HEAD WITHOUT CONTRAST TECHNIQUE: Contiguous axial images were obtained from the base of the skull through the vertex without intravenous contrast. COMPARISON:  Dec 15, 2018 FINDINGS: Brain: The ventricles are normal in size and configuration. There is no intracranial mass, hemorrhage, extra-axial fluid collection, or midline shift. Brain parenchyma appears unremarkable. No acute infarct is evident. Vascular: There is no hyperdense vessel. No vascular calcification evident. Skull: The bony calvarium appears intact. Sinuses/Orbits: Visualized paranasal sinuses are clear. Visualized orbits appear symmetric bilaterally. Other: Mastoid air cells are clear. IMPRESSION: Study within normal  limits. Electronically Signed   By: Bretta Bang III M.D.   On: 12/26/2018 13:32    Procedures Procedures (including critical care time)  Medications Ordered in ED Medications  levETIRAcetam (KEPPRA) IVPB 1000 mg/100 mL premix (has no administration in time range)     Initial Impression / Assessment and Plan / ED Course  I have reviewed the triage vital signs and the nursing notes.  Pertinent labs & imaging results that were available during my care of the patient were reviewed by me and considered in my medical decision making (see chart for details).        Patient presented after recurrent seizure.  Reportedly had tonic-clonic seizure at home.  Had similar ones 10 days ago.  Had been on Neurontin but stopped due to side effects.  Patient does not remember ever having seizures before.  Still some confusion.  States now that she used to drink but has not drank in over a month however she was in the ER 2 days ago and had told her she had been drinking at that time and her alcohol is elevated.  I think with continued mental status changes and recurrent seizure patient benefit from admission to the hospital for further monitoring.  I discussed with neurology, who thinks the patient would benefit from Keppra.  Will need outpatient follow-up but does not initially need to be seen by them in the hospital.  Will need to monitor for alcohol withdrawal since there was a seizure.  Final Clinical Impressions(s) / ED Diagnoses   Final diagnoses:  Seizure-like activity [facility])  Post-ictal confusion    ED Discharge Orders    None       Benjiman Core, MD 12/26/18 1458

## 2018-12-26 NOTE — Telephone Encounter (Signed)
Left detailed voicemail message on grandmother's cell phone advising that we already have access to all of the ED notes/labs/test results from when [name] has been seen.  Dr. Artis Flock already aware and advised would follow recommendations of ER physician.  Advised to call with questions/concerns.

## 2018-12-26 NOTE — H&P (Signed)
HPI  [place] JYN:[id number]:[id number] DOB: 07/21/1996 DOA: 12/26/2018  PCP: Orland MustardWolfe, Allison, MD   Chief Complaint:?  Tonic-clonic seizure  HPI:  23 year old female Bipolar Alcoholism Recent admission 5/25-5/28 grand mal seizure witnessed by grandmother no prior history of seizures On and off alcoholism -She has been to the emergency room multiple times within the past 6 months-at this admission she was prescribed gabapentin 300 3 times daily in addition to Ativan 1 mg 3 times daily 5-day supply and was encouraged to be compliant on restrictions  Comes back to the emergency room as per history below as she is an unreliable historian Per grandmother "Wasn't feeling well this a.m. and hasnt been drinking for days she was out of ativan--wasn't feeling great--was Shakey--took prozac Was laying there and felt somewhat better-went toi shower--there was a loud crash---she collpased and has a Sz  The Sz was her body getting stiff, hands clawed, no incontinence bowel bladder--was out for 2.5 min--started to come to--? lotook her ~2-3 min tro come to and recognize grandma no loss of HR or breathing issues"  Grandmother also relates patient has been drinking on and off despite taking Ativan and gabapentin--she decided to come off the gabapentin that it made her "feel weird"  She is pretty anxious in the emergency room and consistently asks me if the Keppra that she is about to be administered will make her "feel weird"  Labs reassuring in the ED although somewhat hypertensive-last known alcoholic just yesterday as her blood alcohol level was in the 100 range-she had come to the emergency room with nausea vomiting and diarrhea without fever and was having chest tightness and it was felt at the time this was inconsistency with her panic attacks she was given Phenergan CT of the head and she refused the study  She does not currently work her parents are separated 1 lives in [place] the other one  l[place] in [place] her grandmother supports her financially  Patient claims no  bad for drug use but tox screen is pending  ED Course: Patient started on Keppra neurology consulted labs obtained coronavirus pending  Review of Systems:   + Seizure, + anxiety, + hypervigilance - Fever, - chills, - diarrhea, - unilateral weakness, - nausea, - vomiting + Thirst  Past Medical History:  Diagnosis Date  . Anxiety   . Asthma   . Depression   . Pyelonephritis   . Sepsis [facility](HCC)     Past Surgical History:  Procedure Laterality Date  . dental procedure       reports that she quit smoking about 7 months ago. Her smoking use included cigarettes. She smoked 0.50 packs per day. She has never used smokeless tobacco. She reports current alcohol use. She reports that she does not use drugs. Mobility: Independent at baseline  No Known Allergies  Family History  Problem Relation Age of Onset  . Depression Mother   . Miscarriages / [place] Mother   . Hypertension Father   . Alcohol abuse Father   . Learning disabilities Sister   . Hyperlipidemia Maternal Grandmother   . Alcohol abuse Maternal Grandfather   . Alcohol abuse Paternal Grandmother   . Hypertension Paternal Grandfather      Prior to Admission medications   Medication Sig Start Date End Date Taking? Authorizing Provider  FLUoxetine (PROZAC) 40 MG capsule Take 1 capsule (40 mg total) by mouth daily. 12/24/18  Yes Orland MustardWolfe, Allison, MD  gabapentin (NEURONTIN) 300 MG capsule Take 1 capsule (300 mg  total) by mouth 3 (three) times daily for 30 days. 12/18/18 01/17/19 Yes [place], Eric J, DO  ibuprofen (ADVIL) 200 MG tablet Take 400-600 mg by mouth every 6 (six) hours as needed for headache or cramping (pain).   Yes [provider]  Lactobacillus Rhamnosus, GG, (CULTURELLE PO) Take 1 capsule by mouth daily.   Yes [provider]  Multiple Vitamin (MULTIVITAMIN WITH MINERALS) TABS tablet Take 1 tablet by mouth daily.    Yes [provider]  norethindrone-ethinyl estradiol (JUNEL FE,GILDESS FE,LOESTRIN FE) 1-20 MG-MCG tablet Take 1 tablet by mouth daily. 09/17/18  Yes Orland Mustard, MD  Potassium Chloride ER 20 MEQ TBCR Take 20 mEq by mouth 2 (two) times daily.    Yes [provider]  folic acid (FOLVITE) 1 MG tablet Take 1 tablet (1 mg total) by mouth daily. Patient not taking: Reported on 12/26/2018 12/24/18   Orland Mustard, MD  ondansetron (ZOFRAN ODT) 4 MG disintegrating tablet Take 1 tablet (4 mg total) by mouth every 8 (eight) hours as needed for nausea or vomiting. Patient not taking: Reported on 12/26/2018 12/25/18   Elpidio Anis, PA-C  thiamine (VITAMIN B-1) 100 MG tablet Take 1 tablet (100 mg total) by mouth daily. Patient not taking: Reported on 12/26/2018 12/24/18   Orland Mustard, MD    Physical Exam:  Vitals:   12/26/18 1507 12/26/18 1521  BP:  (!) 160/107  Pulse: 86 94  Resp: 16 15  Temp:    SpO2: 100% 100%     Alert anxious.fem  Line no icterus no pallor, no submandibular lymphadenopathy no thyromegaly no JVD  Throat clear  Power 5/5 biceps triceps, reflexes deferred  Babinski sign upgoing  No tongue lesion or bite marks  S1-S2 tachycardic  Abdomen soft no rebound or guarding  No lower extremity edema no rash  Affect is anxious as above  I have personally reviewed following labs and imaging studies  Labs:   Potassium 3.4 BAL less than 10 CT head negative hCG negative  MCV 106 however hemoglobin stable    Active Problems:   Seizure (HCC)   Assessment/Plan ?  Alcoholic withdrawal seizures versus withdrawal abruptly of gabapentin Dr. Rubin Payor of the emergency room discussed with Dr. Crista Curb will load her with Keppra and I will repeat a dose of oral Keppra 1000 twice daily She will probably need outpatient management She is not supposed to drive for 6 months or until seen by PCP/neurology for clearance Ethanolism The patient is dishonest and tells  me she has not had a drink in over a month whereas her grandmother directly contradicts her-she is not even pre-contemplative and it would be risky to send her home on gabapentin or Ativan taper-she will need to find out if she can get resources as an outpatient I will place her on Librium protocol and alcohol detox protocol as I feel she may have some element of withdrawal and she would be monitored best on stepdown at least overnight however can probably go to telemetry in the morning if does not seem to be actively withdrawing Please note that tox screen is pending at this time Mild hypokalemia Repeat labs a.m. Bipolar with severe anxiety May benefit from BuSpar as this is nonsedating, non-activating She is on Prozac which does activate and may push her into mania? I will ask psychiatry to look in on her to help manage meds she may need to be on mood stabilizer such as lithium/Depakote-it is doubtful that she will comply however given  her hypervigilance in the emergency room    Severity of Illness: The appropriate patient status for this patient is INPATIENT. Inpatient status is judged to be reasonable and necessary in order to provide the required intensity of service to ensure the patient's safety. The patient's presenting symptoms, physical exam findings, and initial radiographic and laboratory data in the context of their chronic comorbidities is felt to place them at high risk for further clinical deterioration. Furthermore, it is not anticipated that the patient will be medically stable for discharge from the hospital within 2 midnights of admission. The following factors support the patient status of inpatient.   " The patient's presenting symptoms include seizure. " The worrisome physical exam findings include tachycardia hypertension. " The initial radiographic and laboratory data are worrisome because of issues. " The chronic co-morbidities include ethanolism.   * I certify that at  the point of admission it is my clinical judgment that the patient will require inpatient hospital care spanning beyond 2 midnights from the point of admission due to high intensity of service, high risk for further deterioration and high frequency of surveillance required.*     DVT prophylaxis:scd Code Status: full Family Communication: Long discussion with grandmother on telephone who is very Quarry manager called: Psychiatry  Time spent: 50 minutes  Mahala Menghini, MD  [facility] Direct contact: [phone] --Via amion app OR  --[email or web address]; password [email or web address]  7PM-7AM contact night coverage as above  12/26/2018, 3:34 PM

## 2018-12-26 NOTE — ED Notes (Signed)
ED TO INPATIENT HANDOFF REPORT  ED Nurse Name and Phone #: [phone]   S Name/Age/Gender [name] 23 y.o. female Room/Bed: WA04/WA04  Code Status   Code Status: Prior  Home/SNF/Other Home Patient oriented to: self, place, time and situation Is this baseline? Yes   Triage Complete: Triage complete  Chief Complaint seizure  Triage Note Pt states she had a seizure lasting 1-2 minutes approximately 20 minutes ago. Pt states she now has a headache and is sore all over. Pt states she has not had a seizure before.   Allergies No Known Allergies  Level of Care/Admitting Diagnosis ED Disposition    ED Disposition Condition Comment   Admit  Hospital Area: [facility] [[id number]]  Level of Care: Stepdown [14]  Admit to SDU based on following criteria: Severe physiological/psychological symptoms:  Any diagnosis requiring assessment & intervention at least every 4 hours on an ongoing basis to obtain desired patient outcomes including stability and rehabilitation  Covid Evaluation: N/A  Diagnosis: Seizure [facility]) [205090]  Admitting Physician: Rhetta Mura [phone]  Attending Physician: Rhetta Mura [phone]  Estimated length of stay: past midnight tomorrow  Certification:: I certify this patient will need inpatient services for at least 2 midnights  PT Class (Do Not Modify): Inpatient [101]  PT Acc Code (Do Not Modify): Private [1]       B Medical/Surgery History Past Medical History:  Diagnosis Date  . Anxiety   . Asthma   . Depression   . Pyelonephritis   . Sepsis [facility])    Past Surgical History:  Procedure Laterality Date  . dental procedure       A IV Location/Drains/Wounds Patient Lines/Drains/Airways Status   Active Line/Drains/Airways    Name:   Placement date:   Placement time:   Site:   Days:   Peripheral IV 12/26/18 Right Antecubital   12/26/18    1232    Antecubital   less than 1          Intake/Output  Last 24 hours  Intake/Output Summary (Last 24 hours) at 12/26/2018 1601 Last data filed at 12/26/2018 1540 Gross per 24 hour  Intake 100 ml  Output -  Net 100 ml    Labs/Imaging Results for orders placed or performed during the hospital encounter of 12/26/18 (from the past 48 hour(s))  Basic metabolic panel - if new onset seizures     Status: Abnormal   Collection Time: 12/26/18 12:23 PM  Result Value Ref Range   Sodium 137 135 - 145 mmol/L   Potassium 3.4 (L) 3.5 - 5.1 mmol/L   Chloride 102 98 - 111 mmol/L   CO2 25 22 - 32 mmol/L   Glucose, Bld 95 70 - 99 mg/dL   BUN 6 6 - 20 mg/dL   Creatinine, Ser 8.76 0.44 - 1.00 mg/dL   Calcium 9.9 8.9 - 81.1 mg/dL   GFR calc non Af Amer >60 >60 mL/min   GFR calc Af Amer >60 >60 mL/min   Anion gap 10 5 - 15    Comment: Performed at [facility], [address], [place], [place] [address]  CBC - if new onset seizures     Status: Abnormal   Collection Time: 12/26/18 12:23 PM  Result Value Ref Range   WBC 7.5 4.0 - 10.5 K/uL   RBC 4.03 3.87 - 5.11 MIL/uL   Hemoglobin 13.9 12.0 - 15.0 g/dL   HCT 03.5 59.7 - 41.6 %   MCV 106.2 (H)  80.0 - 100.0 fL   MCH 34.5 (H) 26.0 - 34.0 pg   MCHC 32.5 30.0 - 36.0 g/dL   RDW 65.714.3 84.611.5 - 96.215.5 %   Platelets 450 (H) 150 - 400 K/uL   nRBC 0.3 (H) 0.0 - 0.2 %    Comment: Performed at [facility], [address] [place], [place] [address]  Troponin I - ONCE - STAT     Status: None   Collection Time: 12/26/18 12:23 PM  Result Value Ref Range   Troponin I <0.03 <0.03 ng/mL    Comment: Performed at [facility], [address] [place], [place] [address]  Glucose, capillary     Status: None   Collection Time: 12/26/18 12:25 PM  Result Value Ref Range   Glucose-Capillary 89 70 - 99 mg/dL  I-Stat beta hCG blood, ED     Status: None   Collection Time: 12/26/18 12:48 PM  Result Value Ref Range   I-stat hCG, quantitative <5.0 <5 mIU/mL   Comment 3             Comment:   GEST. AGE      CONC.  (mIU/mL)   <=1 WEEK        5 - 50     2 WEEKS       50 - 500     3 WEEKS       100 - 10,000     4 WEEKS     1,000 - 30,000        FEMALE AND NON-PREGNANT FEMALE:     LESS THAN 5 mIU/mL   Ethanol     Status: None   Collection Time: 12/26/18 12:56 PM  Result Value Ref Range   Alcohol, Ethyl (B) <10 <10 mg/dL    Comment: (NOTE) Lowest detectable limit for serum alcohol is 10 mg/dL. For medical purposes only. Performed at [facility], [address] [place], [place] [address]    Dg Chest 2 View  Result Date: 12/26/2018 CLINICAL DATA:  Seizure for 1-2 minutes in the shower this morning, fall, headache, sore all over, central chest pain and shortness of breath EXAM: CHEST - 2 VIEW COMPARISON:  11/15/2018 FINDINGS: Normal heart size, mediastinal contours, and pulmonary vascularity. Lungs clear. No pleural effusion or pneumothorax. Bones unremarkable. IMPRESSION: Normal exam. Electronically Signed   By: Ulyses SouthwardMark  Boles M.D.   On: 12/26/2018 11:41   Ct Head Wo Contrast  Result Date: 12/26/2018 CLINICAL DATA:  Seizure with headache EXAM: CT HEAD WITHOUT CONTRAST TECHNIQUE: Contiguous axial images were obtained from the base of the skull through the vertex without intravenous contrast. COMPARISON:  Dec 15, 2018 FINDINGS: Brain: The ventricles are normal in size and configuration. There is no intracranial mass, hemorrhage, extra-axial fluid collection, or midline shift. Brain parenchyma appears unremarkable. No acute infarct is evident. Vascular: There is no hyperdense vessel. No vascular calcification evident. Skull: The bony calvarium appears intact. Sinuses/Orbits: Visualized paranasal sinuses are clear. Visualized orbits appear symmetric bilaterally. Other: Mastoid air cells are clear. IMPRESSION: Study within normal limits. Electronically Signed   By: Bretta BangWilliam  Woodruff III M.D.   On: 12/26/2018 13:32    Pending Labs Unresulted Labs (From  admission, onward)    Start     Ordered   12/26/18 1443  SARS Coronavirus 2 (CEPHEID - Performed in [facility] hospital lab), Hosp Order  (Asymptomatic Patients Labs)  Once,   R    Question:  Rule Out  Answer:  Yes   12/26/18 1442   12/26/18 1317  Urinalysis, Routine w reflex microscopic  ONCE - STAT,   STAT     12/26/18 1316   12/26/18 1317  Urine rapid drug screen (hosp performed)  ONCE - STAT,   STAT     12/26/18 1317   Signed and Held  CBC  (enoxaparin (LOVENOX)    CrCl >/= 30 ml/min)  Once,   R    Comments:  Baseline for enoxaparin therapy IF NOT ALREADY DRAWN.  Notify MD if PLT < 100 K.    Signed and Held   Signed and Held  Creatinine, serum  (enoxaparin (LOVENOX)    CrCl >/= 30 ml/min)  Once,   R    Comments:  Baseline for enoxaparin therapy IF NOT ALREADY DRAWN.    Signed and Held   Signed and Held  Creatinine, serum  (enoxaparin (LOVENOX)    CrCl >/= 30 ml/min)  Weekly,   R    Comments:  while on enoxaparin therapy    Signed and Held   Signed and Held  Comprehensive metabolic panel  Tomorrow morning,   R     Signed and Held   Signed and Held  CBC  Tomorrow morning,   R     Signed and Held          Vitals/Pain Today's Vitals   12/26/18 1246 12/26/18 1355 12/26/18 1507 12/26/18 1521  BP: (!) 151/109 (!) 153/118  (!) 160/107  Pulse: (!) 110 79 86 94  Resp: Temp:      TempSrc:      SpO2: 99% 100% 100% 100%  Weight:      Height:      PainSc:        Isolation Precautions No active isolations  Medications Medications  levETIRAcetam (KEPPRA) IVPB 1000 mg/100 mL premix (0 mg Intravenous Stopped 12/26/18 1540)    Mobility walks with person assist Low fall risk   Focused Assessments    R Recommendations: See Admitting Provider Note  Report given to:   Additional Notes:

## 2018-12-26 NOTE — Telephone Encounter (Signed)
Noted  

## 2018-12-26 NOTE — ED Notes (Signed)
Grandmother called and stated that patient stopped taking her gabapentin per her dr orders 2 days ago.

## 2018-12-26 NOTE — Telephone Encounter (Signed)
Copied from CRM [id number]. Topic: General - Other >> Dec 26, 2018 11:39 AM Tamela Oddi wrote: Reason for CRM: Patient's grandmother called to ask the nurse or doctor to call regarding the patient's medication, gabapentin (NEURONTIN) 300 MG capsule.  Grandmother stated that the patient had to go to the ER and one of the doctor's said she should stop taking this medication.  Grandmother would like to speak with the PCP first before the patient stops taking the medication.  Please advise and call back at [phone]

## 2018-12-27 DIAGNOSIS — F339 Major depressive disorder, recurrent, unspecified: Secondary | ICD-10-CM

## 2018-12-27 DIAGNOSIS — F1020 Alcohol dependence, uncomplicated: Secondary | ICD-10-CM

## 2018-12-27 DIAGNOSIS — F1021 Alcohol dependence, in remission: Secondary | ICD-10-CM

## 2018-12-27 LAB — COMPREHENSIVE METABOLIC PANEL
ALT: 26 U/L (ref 0–44)
AST: 38 U/L (ref 15–41)
Albumin: 4 g/dL (ref 3.5–5.0)
Alkaline Phosphatase: 66 U/L (ref 38–126)
Anion gap: 9 (ref 5–15)
BUN: 7 mg/dL (ref 6–20)
CO2: 22 mmol/L (ref 22–32)
Calcium: 9.2 mg/dL (ref 8.9–10.3)
Chloride: 106 mmol/L (ref 98–111)
Creatinine, Ser: 0.56 mg/dL (ref 0.44–1.00)
GFR calc Af Amer: >60 mL/min (ref >60)
GFR calc non Af Amer: >60 mL/min (ref >60)
Glucose, Bld: 77 mg/dL (ref 70–99)
Potassium: 3.3 mmol/L — ABNORMAL LOW (ref 3.5–5.1)
Sodium: 137 mmol/L (ref 135–145)
Total Bilirubin: 0.9 mg/dL (ref 0.3–1.2)
Total Protein: 7.1 g/dL (ref 6.5–8.1)

## 2018-12-27 LAB — CBC
HCT: 38.9 % (ref 36.0–46.0)
Hemoglobin: 12.3 g/dL (ref 12.0–15.0)
MCH: 35.1 pg — ABNORMAL HIGH (ref 26.0–34.0)
MCHC: 31.6 g/dL (ref 30.0–36.0)
MCV: 111.1 fL — ABNORMAL HIGH (ref 80.0–100.0)
Platelets: 355 10*3/uL (ref 150–400)
RBC: 3.5 MIL/uL — ABNORMAL LOW (ref 3.87–5.11)
RDW: 14.4 % (ref 11.5–15.5)
WBC: 8.4 10*3/uL (ref 4.0–10.5)
nRBC: 0 % (ref 0.0–0.2)

## 2018-12-27 LAB — MRSA PCR SCREENING: MRSA by PCR: POSITIVE — AB

## 2018-12-27 MED ORDER — TRAZODONE HCL 50 MG PO TABS: 50.0000 mg | ORAL_TABLET | Freq: Every evening | ORAL | Status: DC | PRN

## 2018-12-27 MED ORDER — POTASSIUM CHLORIDE CRYS ER 20 MEQ PO TBCR
40.0000 meq | EXTENDED_RELEASE_TABLET | Freq: Two times a day (BID) | ORAL | Status: DC
  Administered 2018-12-27: 40 meq via ORAL
  Filled 2018-12-27: qty 2

## 2018-12-27 MED ORDER — PHENYTOIN SODIUM EXTENDED 100 MG PO CAPS: 100.0000 mg | ORAL_CAPSULE | Freq: Two times a day (BID) | ORAL | 6 refills | Status: DC

## 2018-12-27 MED ORDER — POTASSIUM CHLORIDE ER 20 MEQ PO TBCR: 40.0000 meq | EXTENDED_RELEASE_TABLET | Freq: Two times a day (BID) | ORAL | Status: DC

## 2018-12-27 MED ORDER — MUPIROCIN 2 % EX OINT
1.0000 "application " | TOPICAL_OINTMENT | Freq: Two times a day (BID) | CUTANEOUS | Status: DC
  Administered 2018-12-27: 1 via NASAL
  Filled 2018-12-27: qty 22

## 2018-12-27 MED ORDER — CHLORHEXIDINE GLUCONATE CLOTH 2 % EX PADS
6.0000 | MEDICATED_PAD | Freq: Every day | CUTANEOUS | Status: DC
  Administered 2018-12-27: 6 via TOPICAL

## 2018-12-27 MED ORDER — TRAZODONE HCL 50 MG PO TABS: 50.0000 mg | ORAL_TABLET | Freq: Every evening | ORAL | 0 refills | Status: DC | PRN
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
## 2018-12-27 NOTE — Consult Note (Signed)
Telepsych Consultation   Reason for Consult: ''Mania/medication management'' Referring Physician:  Dr. Mahala Menghini Location of Patient: [facility] Location of Provider: [facility]  Patient Identification: Natasha Pope MRN:  [id number] Principal Diagnosis: Alcohol use disorder, severe, dependence (HCC) Diagnosis:  Principal Problem:   Alcohol use disorder, severe, dependence (HCC) Active Problems:   Seizure (HCC)   Recurrent major depressive disorder (HCC)   Total Time spent with patient: 45 minutes  Subjective:   Natasha Pope is a 23 y.o. female patient admitted due to seizure episode .  HPI:  Patient who reports history of Major depression and Alcohol use disorder-severe. She was admitted to the hospital due to seizure. Patient reports history of alcohol use dating back to age 89 and has been drinking on a daily basis since then. She reports history of DUI X1 but has not had alcohol rehabilitation. However, she has been attempting to stop drinking on hr own without success. She denies any drug use but states that she experimented with Xanax and Adderall in high school. She denies mood swings, manic symptoms but reports being diagnosed with depression at the age of 3 following her parents divorce. She is currently taking Prozac for depression. Today parents report ''mild depression'', difficulty sleeping but denies psychosis, delusions, suicidal/homicidal ideations. She is open to a referral to a dual diagnosis psychiatric clinic when she is medically discharged.  Past Psychiatric History: as above  Risk to Self:  denies Risk to Others:  denies Prior Inpatient Therapy:  none Prior Outpatient Therapy:   Receives Prozac from her PCP  Past Medical History:  Past Medical History:  Diagnosis Date  . Anxiety   . Asthma   . Depression   . Pyelonephritis   . Sepsis [facility])     Past Surgical History:  Procedure Laterality Date  . dental procedure      Family History:  Family History  Problem Relation Age of Onset  . Depression Mother   . Miscarriages / [place] Mother   . Hypertension Father   . Alcohol abuse Father   . Learning disabilities Sister   . Hyperlipidemia Maternal Grandmother   . Alcohol abuse Maternal Grandfather   . Alcohol abuse Paternal Grandmother   . Hypertension Paternal Grandfather    Family Psychiatric  History: Mother suffers from Depression Social History: pt is unemployed, lives with her grandmother Social History   Substance and Sexual Activity  Alcohol Use Yes   Comment: 2-3 days/week     Social History   Substance and Sexual Activity  Drug Use No    Social History   Socioeconomic History  . Marital status: Single    Spouse name: Not on file  . Number of children: Not on file  . Years of education: Not on file  . Highest education level: Not on file  Occupational History  . Not on file  Social Needs  . Financial resource strain: Not hard at all  . Food insecurity:    Worry: Never true    Inability: Never true  . Transportation needs:    Medical: No    Non-medical: No  Tobacco Use  . Smoking status: Former Smoker    Packs/day: 0.50    Types: Cigarettes    Last attempt to quit: 05/10/2018    Years since quitting: 0.6  . Smokeless tobacco: Never Used  Substance and Sexual Activity  . Alcohol use: Yes    Comment: 2-3 days/week  . Drug use: No  . Sexual  activity: Not on file  Lifestyle  . Physical activity:    Days per week: 7 days    Minutes per session: 70 min  . Stress: Rather much  Relationships  . Social connections:    Talks on phone: More than three times a week    Gets together: Once a week    Attends religious service: Never    Active member of club or organization: No    Attends meetings of clubs or organizations: Never    Relationship status: Never married  Other Topics Concern  . Not on file  Social History Narrative  . Not on file   Additional  Social History:    Allergies:  No Known Allergies  Labs:  Results for orders placed or performed during the hospital encounter of 12/26/18 (from the past 48 hour(s))  Basic metabolic panel - if new onset seizures     Status: Abnormal   Collection Time: 12/26/18 12:23 PM  Result Value Ref Range   Sodium 137 135 - 145 mmol/L   Potassium 3.4 (L) 3.5 - 5.1 mmol/L   Chloride 102 98 - 111 mmol/L   CO2 25 22 - 32 mmol/L   Glucose, Bld 95 70 - 99 mg/dL   BUN 6 6 - 20 mg/dL   Creatinine, Ser 8.290.59 0.44 - 1.00 mg/dL   Calcium 9.9 8.9 - 56.210.3 mg/dL   GFR calc non Af Amer >60 >60 mL/min   GFR calc Af Amer >60 >60 mL/min   Anion gap 10 5 - 15    Comment: Performed at [facility], [address] [place], [place] [address]  CBC - if new onset seizures     Status: Abnormal   Collection Time: 12/26/18 12:23 PM  Result Value Ref Range   WBC 7.5 4.0 - 10.5 K/uL   RBC 4.03 3.87 - 5.11 MIL/uL   Hemoglobin 13.9 12.0 - 15.0 g/dL   HCT 57.842.8 46.936.0 - 62.946.0 %   MCV 106.2 (H) 80.0 - 100.0 fL   MCH 34.5 (H) 26.0 - 34.0 pg   MCHC 32.5 30.0 - 36.0 g/dL   RDW 52.814.3 41.311.5 - 24.415.5 %   Platelets 450 (H) 150 - 400 K/uL   nRBC 0.3 (H) 0.0 - 0.2 %    Comment: Performed at [facility], [address] [place], [place] [address]  Troponin I - ONCE - STAT     Status: None   Collection Time: 12/26/18 12:23 PM  Result Value Ref Range   Troponin I <0.03 <0.03 ng/mL    Comment: Performed at [facility], [address] [place], [place] [address]  Glucose, capillary     Status: None   Collection Time: 12/26/18 12:25 PM  Result Value Ref Range   Glucose-Capillary 89 70 - 99 mg/dL  I-Stat beta hCG blood, ED     Status: None   Collection Time: 12/26/18 12:48 PM  Result Value Ref Range   I-stat hCG, quantitative <5.0 <5 mIU/mL   Comment 3            Comment:   GEST. AGE      CONC.  (mIU/mL)   <=1 WEEK        5 - 50     2 WEEKS       50 - 500     3 WEEKS        100 - 10,000     4 WEEKS  1,000 - 30,000        FEMALE AND NON-PREGNANT FEMALE:     LESS THAN 5 mIU/mL   Ethanol     Status: None   Collection Time: 12/26/18 12:56 PM  Result Value Ref Range   Alcohol, Ethyl (B) <10 <10 mg/dL    Comment: (NOTE) Lowest detectable limit for serum alcohol is 10 mg/dL. For medical purposes only. Performed at [facility], [address], [place], [place] [address]   SARS Coronavirus 2 (CEPHEID - Performed in [facility] hospital lab), Hosp Order     Status: None   Collection Time: 12/26/18  3:50 PM  Result Value Ref Range   SARS Coronavirus 2 NEGATIVE NEGATIVE    Comment: (NOTE) If result is NEGATIVE SARS-CoV-2 target nucleic acids are NOT DETECTED. The SARS-CoV-2 RNA is generally detectable in upper and lower  respiratory specimens during the acute phase of infection. The lowest  concentration of SARS-CoV-2 viral copies this assay can detect is 250  copies / mL. A negative result does not preclude SARS-CoV-2 infection  and should not be used as the sole basis for treatment or other  patient management decisions.  A negative result may occur with  improper specimen collection / handling, submission of specimen other  than nasopharyngeal swab, presence of viral mutation(s) within the  areas targeted by this assay, and inadequate number of viral copies  (<250 copies / mL). A negative result must be combined with clinical  observations, patient history, and epidemiological information. If result is POSITIVE SARS-CoV-2 target nucleic acids are DETECTED. The SARS-CoV-2 RNA is generally detectable in upper and lower  respiratory specimens dur ing the acute phase of infection.  Positive  results are indicative of active infection with SARS-CoV-2.  Clinical  correlation with patient history and other diagnostic information is  necessary to determine patient infection status.  Positive results do  not rule out bacterial infection or  co-infection with other viruses. If result is PRESUMPTIVE POSTIVE SARS-CoV-2 nucleic acids MAY BE PRESENT.   A presumptive positive result was obtained on the submitted specimen  and confirmed on repeat testing.  While 2019 novel coronavirus  (SARS-CoV-2) nucleic acids may be present in the submitted sample  additional confirmatory testing may be necessary for epidemiological  and / or clinical management purposes  to differentiate between  SARS-CoV-2 and other Sarbecovirus currently known to infect humans.  If clinically indicated additional testing with an alternate test  methodology [phone]) is advised. The SARS-CoV-2 RNA is generally  detectable in upper and lower respiratory sp ecimens during the acute  phase of infection. The expected result is Negative. Fact Sheet for Patients:  BoilerBrush.com.cy Fact Sheet for Healthcare Providers: https://pope.com/ This test is not yet approved or cleared by the [place] FDA and has been authorized for detection and/or diagnosis of SARS-CoV-2 by FDA under an Emergency Use Authorization (EUA).  This EUA will remain in effect (meaning this test can be used) for the duration of the COVID-19 declaration under Section 564(b)(1) of the Act, 21 U.S.C. section 360bbb-3(b)(1), unless the authorization is terminated or revoked sooner. Performed at [facility], [address] [place], [place] [address]   Urinalysis, Routine w reflex microscopic     Status: Abnormal   Collection Time: 12/26/18  3:51 PM  Result Value Ref Range   Color, Urine YELLOW YELLOW   APPearance HAZY (A) CLEAR   Specific Gravity, Urine 1.017 1.005 - 1.030   pH 6.0 5.0 -  8.0   Glucose, UA NEGATIVE NEGATIVE mg/dL   Hgb urine dipstick LARGE (A) NEGATIVE   Bilirubin Urine NEGATIVE NEGATIVE   Ketones, ur 20 (A) NEGATIVE mg/dL   Protein, ur 30 (A) NEGATIVE mg/dL   Nitrite NEGATIVE NEGATIVE   Leukocytes,Ua  TRACE (A) NEGATIVE   RBC / HPF 6-10 0 - 5 RBC/hpf   WBC, UA 11-20 0 - 5 WBC/hpf   Bacteria, UA NONE SEEN NONE SEEN   Squamous Epithelial / LPF 6-10 0 - 5   Mucus PRESENT     Comment: Performed at [facility], [address] [place], [place] [address]  Urine rapid drug screen (hosp performed)     Status: Abnormal   Collection Time: 12/26/18  3:51 PM  Result Value Ref Range   Opiates NONE DETECTED NONE DETECTED   Cocaine NONE DETECTED NONE DETECTED   Benzodiazepines POSITIVE (A) NONE DETECTED   Amphetamines NONE DETECTED NONE DETECTED   Tetrahydrocannabinol NONE DETECTED NONE DETECTED   Barbiturates NONE DETECTED NONE DETECTED    Comment: (NOTE) DRUG SCREEN FOR MEDICAL PURPOSES ONLY.  IF CONFIRMATION IS NEEDED FOR ANY PURPOSE, NOTIFY LAB WITHIN 5 DAYS. LOWEST DETECTABLE LIMITS FOR URINE DRUG SCREEN Drug Class                     Cutoff (ng/mL) Amphetamine and metabolites    1000 Barbiturate and metabolites    200 Benzodiazepine                 200 Tricyclics and metabolites     300 Opiates and metabolites        300 Cocaine and metabolites        300 THC                            50 Performed at [facility], [address] [place], [place] [address]   MRSA PCR Screening     Status: Abnormal   Collection Time: 12/26/18  5:03 PM  Result Value Ref Range   MRSA by PCR POSITIVE (A) NEGATIVE    Comment:        The GeneXpert MRSA Assay (FDA approved for NASAL specimens only), is one component of a comprehensive MRSA colonization surveillance program. It is not intended to diagnose MRSA infection nor to guide or monitor treatment for MRSA infections. RESULT CALLED TO, READ BACK BY AND VERIFIED WITH: Fayne NorrieM KALLAN,RN 12/27/18 0427 RHOLMES Performed at [facility], [address] [place], [place] [address]   Comprehensive metabolic panel     Status: Abnormal   Collection Time: 12/27/18  2:25 AM  Result Value Ref Range    Sodium 137 135 - 145 mmol/L   Potassium 3.3 (L) 3.5 - 5.1 mmol/L   Chloride 106 98 - 111 mmol/L   CO2 22 22 - 32 mmol/L   Glucose, Bld 77 70 - 99 mg/dL   BUN 7 6 - 20 mg/dL   Creatinine, Ser 9.140.56 0.44 - 1.00 mg/dL   Calcium 9.2 8.9 - 78.210.3 mg/dL   Total Protein 7.1 6.5 - 8.1 g/dL   Albumin 4.0 3.5 - 5.0 g/dL   AST 38 15 - 41 U/L   ALT 26 0 - 44 U/L   Alkaline Phosphatase 66 38 - 126 U/L   Total Bilirubin 0.9 0.3 - 1.2 mg/dL   GFR calc non Af Amer >60 >60 mL/min   GFR calc  Af Amer >60 >60 mL/min   Anion gap 9 5 - 15    Comment: Performed at [facility], [place] [address], [place], [place] [address]  CBC     Status: Abnormal   Collection Time: 12/27/18  2:25 AM  Result Value Ref Range   WBC 8.4 4.0 - 10.5 K/uL   RBC 3.50 (L) 3.87 - 5.11 MIL/uL   Hemoglobin 12.3 12.0 - 15.0 g/dL   HCT 38.9 36.0 - 46.0 %   MCV 111.1 (H) 80.0 - 100.0 fL   MCH 35.1 (H) 26.0 - 34.0 pg   MCHC 31.6 30.0 - 36.0 g/dL   RDW 14.4 11.5 - 15.5 %   Platelets 355 150 - 400 K/uL   nRBC 0.0 0.0 - 0.2 %    Comment: Performed at [facility], [place] [address], [place], [place] [address]    Medications:  Current Facility-Administered Medications  Medication Dose Route Frequency Provider Last Rate Last Dose  . Chlorhexidine Gluconate Cloth 2 % PADS 6 each  6 each Topical Q0600 Nita Sells, MD   6 each at 12/27/18 1101  . enoxaparin (LOVENOX) injection 40 mg  40 mg Subcutaneous Q24H Samtani, Jai-Gurmukh, MD      . FLUoxetine (PROZAC) capsule 40 mg  40 mg Oral Daily Nita Sells, MD   40 mg at 12/27/18 1059  . hydrALAZINE (APRESOLINE) injection 5 mg  5 mg Intravenous Q4H PRN Nita Sells, MD      . ibuprofen (ADVIL) tablet 400-600 mg  400-600 mg Oral Q6H PRN Nita Sells, MD   600 mg at 12/26/18 1811  . levETIRAcetam (KEPPRA) tablet 1,000 mg  1,000 mg Oral BID Nita Sells, MD   1,000 mg at 12/27/18 1058  . loperamide (IMODIUM) capsule 2-4  mg  2-4 mg Oral PRN Nita Sells, MD      . metoprolol succinate (TOPROL-XL) 24 hr tablet 25 mg  25 mg Oral Daily Nita Sells, MD   25 mg at 12/27/18 1059  . multivitamin with minerals tablet 1 tablet  1 tablet Oral Daily Nita Sells, MD   1 tablet at 12/27/18 1059  . mupirocin ointment (BACTROBAN) 2 % 1 application  1 application Nasal BID Nita Sells, MD   1 application at 13/24/40 1101  . norethindrone-ethinyl estradiol (LOESTRIN FE) 1-20 MG-MCG per tablet 1 tablet  1 tablet Oral QHS Samtani, Jai-Gurmukh, MD      . ondansetron (ZOFRAN-ODT) disintegrating tablet 4 mg  4 mg Oral Q6H PRN Samtani, Jai-Gurmukh, MD      . potassium chloride SA (K-DUR) CR tablet 40 mEq  40 mEq Oral BID Nita Sells, MD   40 mEq at 12/27/18 1059  . thiamine (VITAMIN B-1) tablet 100 mg  100 mg Oral Daily Nita Sells, MD   100 mg at 12/27/18 1059  . traZODone (DESYREL) tablet 50 mg  50 mg Oral QHS PRN Corena Pilgrim, MD        Musculoskeletal: Strength & Muscle Tone: within normal limits Gait & Station: normal Patient leans: N/A  Psychiatric Specialty Exam: Physical Exam  Psychiatric: She has a normal mood and affect. Her speech is normal and behavior is normal. Judgment and thought content normal. Cognition and memory are normal.    Review of Systems  Constitutional: Negative.   HENT: Negative.   Eyes: Negative.   Respiratory: Negative.   Cardiovascular: Negative.   Gastrointestinal: Negative.   Genitourinary: Negative.   Musculoskeletal: Negative.   Skin: Negative.   Neurological:  Negative.   Endo/Heme/Allergies: Negative.   Psychiatric/Behavioral: The patient has insomnia.     Blood pressure (!) 154/93, pulse 87, temperature 98.4 F (36.9 C), temperature source Oral, resp. rate (!) 27, height 5\' 6"  (1.676 m), weight 73 kg, last menstrual period 12/04/2018, SpO2 100 %.Body mass index is 25.98 kg/m.  General Appearance: Casual  Eye Contact:  Good   Speech:  Clear and Coherent  Volume:  Normal  Mood:  Euthymic  Affect:  Appropriate  Thought Process:  Coherent and Linear  Orientation:  Full (Time, Place, and Person)  Thought Content:  Logical  Suicidal Thoughts:  No  Homicidal Thoughts:  No  Memory:  Immediate;   Good Recent;   Good Remote;   Good  Judgement:  Intact  Insight:  Fair  Psychomotor Activity:  Normal  Concentration:  Concentration: Good and Attention Span: Good  Recall:  Good  Fund of Knowledge:  Good  Language:  Good  Akathisia:  No  Handed:  Right  AIMS (if indicated):     Assets:  Communication Skills Desire for Improvement Social Support  ADL's:  Intact  Cognition:  WNL  Sleep:   fair     Treatment Plan Summary: 23 year old female with history of alcohol use disorder-severe, Depression, asthma who was admitted following a seizure episode most likely related to alcohol use. Today, patient is calm, cooperative with no evidence of alcohol withdrawal, psychosis, delusions, mania and denies suicidal/homicidal ideations, intent or plan.  Recommendations: -Continue Prozac for depression. -Consider adding Trazodone 50 mg at bedtime as needed for sleep. -Consider [facility] or [facility] for counseling and medication management upon discharge. -Psychiatric service signing out. Re-consult psych as needed  Disposition: No evidence of imminent risk to self or others at present.   Supportive therapy provided about ongoing stressors.  This service was provided via telemedicine using a 2-way, interactive audio and video technology.  Names of all persons participating in this telemedicine service and their role in this encounter. Name: [facility] Role: Patient  Name: Thedore MinsMojeed Raeana Blinn, MD Role: Psychiatrist    Thedore MinsMojeed Rhoda Waldvogel, MD 12/27/2018 12:30 PM

## 2018-12-29 ENCOUNTER — Other Ambulatory Visit: Payer: Self-pay

## 2018-12-29 ENCOUNTER — Ambulatory Visit (INDEPENDENT_AMBULATORY_CARE_PROVIDER_SITE_OTHER): Payer: 59 | Admitting: Family Medicine

## 2018-12-29 ENCOUNTER — Encounter: Payer: Self-pay | Admitting: Family Medicine

## 2018-12-29 DIAGNOSIS — M79671 Pain in right foot: Secondary | ICD-10-CM

## 2018-12-29 DIAGNOSIS — F1094 Alcohol use, unspecified with alcohol-induced mood disorder: Secondary | ICD-10-CM

## 2018-12-29 DIAGNOSIS — R569 Unspecified convulsions: Secondary | ICD-10-CM | POA: Diagnosis not present

## 2018-12-29 DIAGNOSIS — F411 Generalized anxiety disorder: Secondary | ICD-10-CM

## 2018-12-29 NOTE — Progress Notes (Signed)
Virtual Visit via Video Note  Subjective  CC:  Chief Complaint  Patient presents with  . Hospitalization Follow-up    Admitted 6/5 and discharged 6/6 for seizures, was started on Dilantin.     I connected with [place] on 12/29/18 at  3:40 PM EDT by a video enabled telemedicine application and verified that I am speaking with the correct person using two identifiers. Location patient: Home Location provider: [facility] [place], Office Persons participating in the virtual visit: Elizabeth, Leamon Arnt, MD Lilli Light, [place] discussed the limitations of evaluation and management by telemedicine and the availability of in person appointments. The patient expressed understanding and agreed to proceed. HPI: Natasha Pope is a 23 y.o. female who was contacted today to address the problems listed above in the chief complaint. . New pt to me. Extensive chart review done.  . 23 yo with alcoholism and h/o presumed withdrawal seizures recently admitted overnight for seizure and observation; I reviewed those notes.  . Started on dilantin per neurology: needs f/u with neuro.  . 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 Has struggled with anxiety. Needs psych eval . Her main concern is pain in her right foot: reports fell/seizure in the shower and has had pain and swelling in dorsal right foot; reports ER was aware. She wants xrays. She is ambulatory.   Assessment  1. 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 GAD (generalized anxiety disorder)   3. Seizure ([place])   4. Right foot pain      Plan   alcoholism:  rec abstinence.   Mood disorder/anxiety: rec psychiatry evaluation. Referral placed.   Seizures: alcohol w/d. Now on dilantin. Needs neurology for mgt and future clearance for driving. Referral placed.   Foot pain: rec ov for evaluation/exam and xrays.   I discussed the assessment and treatment  plan with the patient. The patient was provided an opportunity to ask questions and all were answered. The patient agreed with the plan and demonstrated an understanding of the instructions.   The patient was advised to call back or seek an in-person evaluation if the symptoms worsen or if the condition fails to improve as anticipated. Follow up: Return in about 1 week (around 01/05/2019) for check foot, needs xray.  Visit date not found  No orders of the defined types were placed in this encounter.     I reviewed the patients updated PMH, FH, and SocHx.    Patient Active Problem List   Diagnosis Date Noted  . Alcohol use disorder, severe, dependence ([place]) 12/27/2018  . Recurrent major depressive disorder ([place]) 12/27/2018  . Seizure ([place]) 12/26/2018  . 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 Substance induced mood disorder ([place]) 12/09/2018  . Alcohol abuse 12/09/2018  . Acute pyelonephritis 05/19/2018  . Hypokalemia 05/19/2018  . Hypocalcemia 05/19/2018  . Hypomagnesemia 05/19/2018  . Macrocytic anemia 05/19/2018  . Electrolyte disturbance 05/19/2018  . Thrombocytopenia ([place]) 05/19/2018  . Alcohol use 05/19/2018  . ASCUS of cervix with negative high risk HPV 03/07/2018  . Tobacco abuse 01/31/2018  . Asthma 01/31/2018  . GAD (generalized anxiety disorder) 01/31/2018   Current Meds  Medication Sig  . FLUoxetine (PROZAC) 40 MG capsule Take 1 capsule (40 mg total) by mouth daily.  Marland Kitchen ibuprofen (ADVIL) 200 MG tablet Take 400-600 mg by mouth every 6 (six) hours as needed for headache or cramping (pain).  Marland Kitchen  norethindrone-ethinyl estradiol (JUNEL FE,GILDESS FE,LOESTRIN FE) 1-20 MG-MCG tablet Take 1 tablet by mouth daily.  . phenytoin (DILANTIN) 100 MG ER capsule Take 1 capsule (100 mg total) by mouth 2 (two) times daily.  . Potassium Chloride ER 20 MEQ TBCR Take 40 mEq by mouth 2 (two) times daily.  . traZODone (DESYREL) 50 MG tablet Take 1 tablet (50 mg total) by mouth  at bedtime as needed for sleep.    Allergies: Patient has No Known Allergies. Family History: Patient family history includes Alcohol abuse in her father, maternal grandfather, and paternal grandmother; Depression in her mother; Hyperlipidemia in her maternal grandmother; Hypertension in her father and paternal grandfather; Learning disabilities in her sister; Miscarriages / Stillbirths in her mother. Social History:  Patient  reports that she quit smoking about 7 months ago. Her smoking use included cigarettes. She smoked 0.50 packs per day. She has never used smokeless tobacco. She reports current alcohol use. She reports that she does not use drugs.  Review of Systems: Constitutional: Negative for fever malaise or anorexia Cardiovascular: negative for chest pain Respiratory: negative for SOB or persistent cough Gastrointestinal: negative for abdominal pain  OBJECTIVE Vitals: LMP 12/04/2018 (Exact Date) Comment: neg hcg General: no acute distress , A&Ox3 Right foot: lateral dorsal swelling and ecchymosis visible. Ambulating around her home easily.  Willow Oraamille L Juandedios Dudash, MD

## 2018-12-31 ENCOUNTER — Encounter: Payer: Self-pay | Admitting: Family Medicine

## 2018-12-31 ENCOUNTER — Other Ambulatory Visit: Payer: Self-pay

## 2018-12-31 ENCOUNTER — Ambulatory Visit (INDEPENDENT_AMBULATORY_CARE_PROVIDER_SITE_OTHER): Payer: 59

## 2018-12-31 ENCOUNTER — Ambulatory Visit: Payer: 59 | Admitting: Family Medicine

## 2018-12-31 VITALS — BP 110/70 | HR 108 | Temp 99.5°F | Resp 16 | Ht 67.0 in | Wt 166.6 lb

## 2018-12-31 DIAGNOSIS — R21 Rash and other nonspecific skin eruption: Secondary | ICD-10-CM | POA: Diagnosis not present

## 2018-12-31 DIAGNOSIS — M79671 Pain in right foot: Secondary | ICD-10-CM

## 2018-12-31 DIAGNOSIS — S9031XA Contusion of right foot, initial encounter: Secondary | ICD-10-CM

## 2018-12-31 NOTE — Patient Instructions (Signed)
Try zyrtec nightly for rash.  Aveeno baths or creams can be soothing.   You may use advil or tylenol for your foot pain.  Wear the walking shoe as needed for comfort.   We will call you with information regarding your referral appointment. Psychiatry and neurology.  If you do not hear from [place] within the next 2 weeks, please let me know. It can take 1-2 weeks to get appointments set up with the specialists.   Please do not drink any alcohol.  Do not stop any of your medications.

## 2018-12-31 NOTE — Progress Notes (Signed)
Subjective  CC:  Chief Complaint  Patient presents with  . Foot Pain    Right foot. Happening during her seizure on 12/26/18, she is taking Advil.  . Seizures    She reports that since last seizure she is not feeling like herself.  . Rash    Unsure exactly when it started, primarly on her legs    HPI: Natasha Pope is a 23 y.o. female who presents to the office today to address the problems listed above in the chief complaint.  Reviewed last notes. See recent hospitalization for w/d seizures started on dilantin. Fell in shower 6/5 due to seizure and right foot is bruised and swollen. Can ambulate.  Rash on legs x 2 weeks. Itching. Non spreading. ? New med or other. No blisters.   Referrals for psych and neuro placed for f/u.   Assessment  1. Right foot pain   2. Contusion of right foot, initial encounter   3. Rash due to allergy      Plan   Contusions foot:  Firm walking shoe and time. Await final xray readings.  Rash: ? Allergic. Zyrtec nightly and monitor.   Follow up: Return if symptoms worsen or fail to improve.  Visit date not found  Orders Placed This Encounter  Procedures  . DG Foot Complete Right   No orders of the defined types were placed in this encounter.     I reviewed the patients updated PMH, FH, and SocHx.    Patient Active Problem List   Diagnosis Date Noted  . Alcohol use disorder, severe, dependence (HCC) 12/27/2018  . Recurrent major depressive disorder (HCC) 12/27/2018  . Seizure (HCC) 12/26/2018  . Alcohol use with alcohol-induced mood disorder (HCC)   . Substance induced mood disorder (HCC) 12/09/2018  . Alcohol abuse 12/09/2018  . Acute pyelonephritis 05/19/2018  . Hypokalemia 05/19/2018  . Hypocalcemia 05/19/2018  . Hypomagnesemia 05/19/2018  . Macrocytic anemia 05/19/2018  . Electrolyte disturbance 05/19/2018  . Thrombocytopenia (HCC) 05/19/2018  . Alcohol use 05/19/2018  . ASCUS of cervix with negative high risk HPV  03/07/2018  . Tobacco abuse 01/31/2018  . Asthma 01/31/2018  . GAD (generalized anxiety disorder) 01/31/2018   Current Meds  Medication Sig  . FLUoxetine (PROZAC) 40 MG capsule Take 1 capsule (40 mg total) by mouth daily.  . folic acid (FOLVITE) 1 MG tablet Take 1 tablet (1 mg total) by mouth daily.  Marland Kitchen. ibuprofen (ADVIL) 200 MG tablet Take 400-600 mg by mouth every 6 (six) hours as needed for headache or cramping (pain).  . norethindrone-ethinyl estradiol (JUNEL FE,GILDESS FE,LOESTRIN FE) 1-20 MG-MCG tablet Take 1 tablet by mouth daily.  . ondansetron (ZOFRAN ODT) 4 MG disintegrating tablet Take 1 tablet (4 mg total) by mouth every 8 (eight) hours as needed for nausea or vomiting.  . phenytoin (DILANTIN) 100 MG ER capsule Take 1 capsule (100 mg total) by mouth 2 (two) times daily.  . Potassium Chloride ER 20 MEQ TBCR Take 20 mEq by mouth 2 (two) times daily.  Marland Kitchen. thiamine (VITAMIN B-1) 100 MG tablet Take 1 tablet (100 mg total) by mouth daily.  . traZODone (DESYREL) 50 MG tablet Take 1 tablet (50 mg total) by mouth at bedtime as needed for sleep.  . [DISCONTINUED] Potassium Chloride ER 20 MEQ TBCR Take 40 mEq by mouth 2 (two) times daily.    Allergies: Patient has No Known Allergies. Family History: Patient family history includes Alcohol abuse in her father, maternal grandfather, and  paternal grandmother; Depression in her mother; Hyperlipidemia in her maternal grandmother; Hypertension in her father and paternal grandfather; Learning disabilities in her sister; Miscarriages / Stillbirths in her mother. Social History:  Patient  reports that she quit smoking about 7 months ago. Her smoking use included cigarettes. She smoked 0.50 packs per day. She has never used smokeless tobacco. She reports current alcohol use. She reports that she does not use drugs.  Review of Systems: Constitutional: Negative for fever malaise or anorexia Cardiovascular: negative for chest pain Respiratory: negative  for SOB or persistent cough Gastrointestinal: negative for abdominal pain  Objective  Vitals: BP 110/70   Pulse (!) 108   Temp 99.5 F (37.5 C) (Oral)   Resp 16   Ht 5\' 7"  (1.702 m)   Wt 166 lb 9.6 oz (75.6 kg)   LMP 12/04/2018 (Exact Date) Comment: neg hcg  SpO2 96%   BMI 26.09 kg/m  General: no acute distress , slightly confused. No slurred speech.  HEENT: PEERL, conjunctiva normal, Oropharynx moist,neck is supple Right foot: resolving ecchymosis over dorsal foot; tender in most places but 3-4 metatarsals specifically, nl gait Skin:  Warm, maculopapular rash on legs    Commons side effects, risks, benefits, and alternatives for medications and treatment plan prescribed today were discussed, and the patient expressed understanding of the given instructions. Patient is instructed to call or message via MyChart if he/she has any questions or concerns regarding our treatment plan. No barriers to understanding were identified. We discussed Red Flag symptoms and signs in detail. Patient expressed understanding regarding what to do in case of urgent or emergency type symptoms.   Medication list was reconciled, printed and provided to the patient in AVS. Patient instructions and summary information was reviewed with the patient as documented in the AVS. This note was prepared with assistance of Dragon voice recognition software. Occasional wrong-word or sound-a-like substitutions may have occurred due to the inherent limitations of voice recognition software

## 2019-01-07 ENCOUNTER — Telehealth: Payer: Self-pay | Admitting: Family Medicine

## 2019-01-07 NOTE — Telephone Encounter (Signed)
Spoke to patient's grandmother.  Patient is c/o symptoms of numbness in legs, chest and tongue since starting Dilantin.  Also having episodes of memory loss.  Advised that I would forward a message to Dr. Jonni Sanger to advise further on what she suggests.  Grandmother verbalized understanding and further understands that I will not be contacting her back until tomorrow 6/18.

## 2019-01-07 NOTE — Telephone Encounter (Signed)
Patient's grandmother, Jackelyn Poling, calling and is requesting a call back from Apolonio Schneiders, Dr Rogers Blocker, or Dr Jonni Sanger regarding the patient medications. States that she does not think her medications are right. Please advise.  CB#: [phone]

## 2019-01-08 NOTE — Telephone Encounter (Signed)
Spoke with patient's grandmother, Tonna Boehringer.  Reviewed all recommendations per Dr. Jonni Sanger.  Grandmother reports that [name] is in fact still drinking.  I advised per notes of Dr. Jonni Sanger that there is very little we can do at this point to help [name] since she insists on continuing to drink.  Grandmother verbalized understanding of all recommendations per Dr. Jonni Sanger and I advised her to strongly urge [name] that she needs to stop drinking.

## 2019-01-08 NOTE — Telephone Encounter (Signed)
Please let grandmother know that the neurologist will help with the dilantin. She is on a low dose.  It is difficult to help: if Natasha Pope is drinking alcohol at all or using benzodiazepines (xanax, etc; the UDS was + at her last hospitalization), then these sxs could be coming from the mixing of the medications/alcohol.   Please keep f/u appt with the neurologist; ensure she is not drinking and rec rehab or detox if needed.  If she is not drinking or using benzos', and they feel they need more urgent help before they see neurology, they can schedule an appt here with me; if she is having weakness or emergency sxs, then the ER would be the place to go.

## 2019-01-10 ENCOUNTER — Other Ambulatory Visit: Payer: Self-pay

## 2019-01-10 ENCOUNTER — Encounter (HOSPITAL_COMMUNITY): Payer: Self-pay | Admitting: Student

## 2019-01-10 ENCOUNTER — Inpatient Hospital Stay (HOSPITAL_COMMUNITY)
Admission: EM | Admit: 2019-01-10 | Discharge: 2019-01-16 | DRG: 897 | Disposition: A | Discharge status: Other Acute Inpatient Hospital | Payer: 59 | Attending: Internal Medicine | Admitting: Internal Medicine

## 2019-01-10 ENCOUNTER — Inpatient Hospital Stay (HOSPITAL_COMMUNITY): Admission: AD | Admit: 2019-01-10 | Payer: 59 | Source: Intra-hospital | Admitting: Psychiatry

## 2019-01-10 DIAGNOSIS — F339 Major depressive disorder, recurrent, unspecified: Secondary | ICD-10-CM | POA: Diagnosis present

## 2019-01-10 DIAGNOSIS — F10939 Alcohol use, unspecified with withdrawal, unspecified: Secondary | ICD-10-CM | POA: Diagnosis present

## 2019-01-10 DIAGNOSIS — R9431 Abnormal electrocardiogram [ECG] [EKG]: Secondary | ICD-10-CM | POA: Diagnosis present

## 2019-01-10 DIAGNOSIS — F419 Anxiety disorder, unspecified: Secondary | ICD-10-CM

## 2019-01-10 DIAGNOSIS — F10230 Alcohol dependence with withdrawal, uncomplicated: Secondary | ICD-10-CM

## 2019-01-10 DIAGNOSIS — R000 Tachycardia, unspecified: Secondary | ICD-10-CM | POA: Diagnosis not present

## 2019-01-10 DIAGNOSIS — R45851 Suicidal ideations: Secondary | ICD-10-CM | POA: Diagnosis present

## 2019-01-10 DIAGNOSIS — R112 Nausea with vomiting, unspecified: Secondary | ICD-10-CM

## 2019-01-10 DIAGNOSIS — Y906 Blood alcohol level of 120-199 mg/100 ml: Secondary | ICD-10-CM | POA: Diagnosis present

## 2019-01-10 DIAGNOSIS — F1994 Other psychoactive substance use, unspecified with psychoactive substance-induced mood disorder: Secondary | ICD-10-CM | POA: Diagnosis present

## 2019-01-10 DIAGNOSIS — E876 Hypokalemia: Secondary | ICD-10-CM | POA: Diagnosis not present

## 2019-01-10 DIAGNOSIS — I10 Essential (primary) hypertension: Secondary | ICD-10-CM | POA: Diagnosis present

## 2019-01-10 DIAGNOSIS — J45909 Unspecified asthma, uncomplicated: Secondary | ICD-10-CM | POA: Diagnosis present

## 2019-01-10 DIAGNOSIS — F1024 Alcohol dependence with alcohol-induced mood disorder: Secondary | ICD-10-CM | POA: Diagnosis present

## 2019-01-10 DIAGNOSIS — F10229 Alcohol dependence with intoxication, unspecified: Secondary | ICD-10-CM | POA: Diagnosis present

## 2019-01-10 DIAGNOSIS — R569 Unspecified convulsions: Secondary | ICD-10-CM

## 2019-01-10 DIAGNOSIS — F17290 Nicotine dependence, other tobacco product, uncomplicated: Secondary | ICD-10-CM | POA: Diagnosis present

## 2019-01-10 DIAGNOSIS — F1094 Alcohol use, unspecified with alcohol-induced mood disorder: Secondary | ICD-10-CM | POA: Diagnosis present

## 2019-01-10 DIAGNOSIS — Z811 Family history of alcohol abuse and dependence: Secondary | ICD-10-CM

## 2019-01-10 DIAGNOSIS — Z1159 Encounter for screening for other viral diseases: Secondary | ICD-10-CM

## 2019-01-10 DIAGNOSIS — R443 Hallucinations, unspecified: Secondary | ICD-10-CM | POA: Diagnosis not present

## 2019-01-10 DIAGNOSIS — Z888 Allergy status to other drugs, medicaments and biological substances status: Secondary | ICD-10-CM

## 2019-01-10 DIAGNOSIS — Z720 Tobacco use: Secondary | ICD-10-CM | POA: Diagnosis present

## 2019-01-10 DIAGNOSIS — F10239 Alcohol dependence with withdrawal, unspecified: Secondary | ICD-10-CM | POA: Diagnosis not present

## 2019-01-10 DIAGNOSIS — Z818 Family history of other mental and behavioral disorders: Secondary | ICD-10-CM

## 2019-01-10 DIAGNOSIS — Z79899 Other long term (current) drug therapy: Secondary | ICD-10-CM

## 2019-01-10 DIAGNOSIS — Z87891 Personal history of nicotine dependence: Secondary | ICD-10-CM

## 2019-01-10 DIAGNOSIS — F1010 Alcohol abuse, uncomplicated: Secondary | ICD-10-CM | POA: Diagnosis present

## 2019-01-10 LAB — RAPID URINE DRUG SCREEN, HOSP PERFORMED
Amphetamines: NOT DETECTED
Barbiturates: NOT DETECTED
Benzodiazepines: POSITIVE — AB
Cocaine: NOT DETECTED
Opiates: NOT DETECTED
Tetrahydrocannabinol: NOT DETECTED

## 2019-01-10 LAB — COMPREHENSIVE METABOLIC PANEL
ALT: 95 U/L — ABNORMAL HIGH (ref 0–44)
AST: 238 U/L — ABNORMAL HIGH (ref 15–41)
Albumin: 4.7 g/dL (ref 3.5–5.0)
Alkaline Phosphatase: 144 U/L — ABNORMAL HIGH (ref 38–126)
Anion gap: 18 — ABNORMAL HIGH (ref 5–15)
BUN: 10 mg/dL (ref 6–20)
CO2: 20 mmol/L — ABNORMAL LOW (ref 22–32)
Calcium: 9 mg/dL (ref 8.9–10.3)
Chloride: 98 mmol/L (ref 98–111)
Creatinine, Ser: 0.64 mg/dL (ref 0.44–1.00)
GFR calc Af Amer: >60 mL/min (ref >60)
GFR calc non Af Amer: >60 mL/min (ref >60)
Glucose, Bld: 112 mg/dL — ABNORMAL HIGH (ref 70–99)
Potassium: 3.5 mmol/L (ref 3.5–5.1)
Sodium: 136 mmol/L (ref 135–145)
Total Bilirubin: 0.6 mg/dL (ref 0.3–1.2)
Total Protein: 7.9 g/dL (ref 6.5–8.1)

## 2019-01-10 LAB — CBC WITH DIFFERENTIAL/PLATELET
Abs Immature Granulocytes: 0.02 10*3/uL (ref 0.00–0.07)
Basophils Absolute: 0.1 10*3/uL (ref 0.0–0.1)
Basophils Relative: 2 %
Eosinophils Absolute: 0 10*3/uL (ref 0.0–0.5)
Eosinophils Relative: 1 %
HCT: 42 % (ref 36.0–46.0)
Hemoglobin: 13.8 g/dL (ref 12.0–15.0)
Immature Granulocytes: 1 %
Lymphocytes Relative: 26 %
Lymphs Abs: 1.1 10*3/uL (ref 0.7–4.0)
MCH: 33.4 pg (ref 26.0–34.0)
MCHC: 32.9 g/dL (ref 30.0–36.0)
MCV: 101.7 fL — ABNORMAL HIGH (ref 80.0–100.0)
Monocytes Absolute: 0.3 10*3/uL (ref 0.1–1.0)
Monocytes Relative: 8 %
Neutro Abs: 2.7 10*3/uL (ref 1.7–7.7)
Neutrophils Relative %: 62 %
Platelets: 172 10*3/uL (ref 150–400)
RBC: 4.13 MIL/uL (ref 3.87–5.11)
RDW: 14.6 % (ref 11.5–15.5)
WBC: 4.2 10*3/uL (ref 4.0–10.5)
nRBC: 0 % (ref 0.0–0.2)

## 2019-01-10 LAB — URINALYSIS, ROUTINE W REFLEX MICROSCOPIC
Bilirubin Urine: NEGATIVE
Glucose, UA: NEGATIVE mg/dL
Hgb urine dipstick: NEGATIVE
Ketones, ur: NEGATIVE mg/dL
Leukocytes,Ua: NEGATIVE
Nitrite: NEGATIVE
Protein, ur: 30 mg/dL — AB
Specific Gravity, Urine: 1.02 (ref 1.005–1.030)
pH: 9 — ABNORMAL HIGH (ref 5.0–8.0)

## 2019-01-10 LAB — I-STAT BETA HCG BLOOD, ED (MC, WL, AP ONLY): I-stat hCG, quantitative: <5 m[IU]/mL (ref <5)

## 2019-01-10 LAB — SALICYLATE LEVEL: Salicylate Lvl: <7 mg/dL (ref 2.8–30.0)

## 2019-01-10 LAB — SARS CORONAVIRUS 2 BY RT PCR (HOSPITAL ORDER, PERFORMED IN ~~LOC~~ HOSPITAL LAB): SARS Coronavirus 2: NEGATIVE

## 2019-01-10 LAB — PHENYTOIN LEVEL, TOTAL: Phenytoin Lvl: <2.5 ug/mL — ABNORMAL LOW (ref 10.0–20.0)

## 2019-01-10 LAB — ACETAMINOPHEN LEVEL: Acetaminophen (Tylenol), Serum: <10 ug/mL — ABNORMAL LOW (ref 10–30)

## 2019-01-10 LAB — ETHANOL: Alcohol, Ethyl (B): 151 mg/dL — ABNORMAL HIGH (ref <10)

## 2019-01-10 LAB — LIPASE, BLOOD: Lipase: 28 U/L (ref 11–51)

## 2019-01-10 MED ORDER — ONDANSETRON HCL 4 MG PO TABS: 4.0000 mg | ORAL_TABLET | Freq: Three times a day (TID) | ORAL | Status: DC | PRN

## 2019-01-10 MED ORDER — VITAMIN B-1 100 MG PO TABS
100.0000 mg | ORAL_TABLET | Freq: Every day | ORAL | Status: DC
  Administered 2019-01-10: 13:00:00 100 mg via ORAL
  Filled 2019-01-10 (×2): qty 1

## 2019-01-10 MED ORDER — VITAMIN B-1 100 MG PO TABS
100.0000 mg | ORAL_TABLET | Freq: Every day | ORAL | Status: DC
  Administered 2019-01-11 – 2019-01-16 (×5): 100 mg via ORAL
  Filled 2019-01-10 (×5): qty 1

## 2019-01-10 MED ORDER — FOLIC ACID 1 MG PO TABS
1.0000 mg | ORAL_TABLET | Freq: Every day | ORAL | Status: DC
  Administered 2019-01-10: 1 mg via ORAL
  Filled 2019-01-10: qty 1

## 2019-01-10 MED ORDER — FOLIC ACID 1 MG PO TABS: 1.0000 mg | ORAL_TABLET | Freq: Every day | ORAL | Status: DC

## 2019-01-10 MED ORDER — LORAZEPAM 2 MG/ML IJ SOLN
2.0000 mg | Freq: Once | INTRAMUSCULAR | Status: AC
  Administered 2019-01-10: 2 mg via INTRAVENOUS

## 2019-01-10 MED ORDER — LORAZEPAM 2 MG/ML IJ SOLN: 1.0000 mg | Freq: Four times a day (QID) | INTRAMUSCULAR | Status: DC | PRN

## 2019-01-10 MED ORDER — SODIUM CHLORIDE 0.9 % IV BOLUS
1000.0000 mL | Freq: Once | INTRAVENOUS | Status: AC
  Administered 2019-01-10: 1000 mL via INTRAVENOUS

## 2019-01-10 MED ORDER — ALUM & MAG HYDROXIDE-SIMETH 200-200-20 MG/5ML PO SUSP
30.0000 mL | Freq: Four times a day (QID) | ORAL | Status: DC | PRN
  Administered 2019-01-14 – 2019-01-16 (×3): 30 mL via ORAL
  Filled 2019-01-10 (×3): qty 30

## 2019-01-10 MED ORDER — ADULT MULTIVITAMIN W/MINERALS CH: 1.0000 | ORAL_TABLET | Freq: Every day | ORAL | Status: DC

## 2019-01-10 MED ORDER — THIAMINE HCL 100 MG/ML IJ SOLN
100.0000 mg | Freq: Every day | INTRAMUSCULAR | Status: DC
  Administered 2019-01-12: 100 mg via INTRAVENOUS
  Filled 2019-01-10 (×2): qty 2

## 2019-01-10 MED ORDER — POTASSIUM CHLORIDE ER 10 MEQ PO TBCR
20.0000 meq | EXTENDED_RELEASE_TABLET | Freq: Every day | ORAL | Status: DC
  Administered 2019-01-10: 14:00:00 20 meq via ORAL
  Filled 2019-01-10 (×3): qty 2

## 2019-01-10 MED ORDER — TRAZODONE HCL 50 MG PO TABS: 50.0000 mg | ORAL_TABLET | Freq: Every evening | ORAL | Status: DC | PRN

## 2019-01-10 MED ORDER — LORAZEPAM 1 MG PO TABS: 0.0000 mg | ORAL_TABLET | Freq: Four times a day (QID) | ORAL | Status: DC

## 2019-01-10 MED ORDER — SODIUM CHLORIDE 0.9% FLUSH
3.0000 mL | Freq: Two times a day (BID) | INTRAVENOUS | Status: DC
  Administered 2019-01-10 – 2019-01-15 (×9): 3 mL via INTRAVENOUS

## 2019-01-10 MED ORDER — ACETAMINOPHEN 325 MG PO TABS
650.0000 mg | ORAL_TABLET | Freq: Four times a day (QID) | ORAL | Status: DC | PRN
  Administered 2019-01-11 – 2019-01-14 (×4): 650 mg via ORAL
  Filled 2019-01-10 (×4): qty 2

## 2019-01-10 MED ORDER — ADULT MULTIVITAMIN W/MINERALS CH
1.0000 | ORAL_TABLET | Freq: Every day | ORAL | Status: DC
  Administered 2019-01-10: 1 via ORAL
  Filled 2019-01-10 (×2): qty 1

## 2019-01-10 MED ORDER — SODIUM CHLORIDE 0.9 % IV SOLN
INTRAVENOUS | Status: DC
  Administered 2019-01-10 – 2019-01-11 (×2): via INTRAVENOUS

## 2019-01-10 MED ORDER — FLUOXETINE HCL 20 MG PO CAPS
40.0000 mg | ORAL_CAPSULE | Freq: Every day | ORAL | Status: DC
  Administered 2019-01-10 – 2019-01-16 (×7): 40 mg via ORAL
  Filled 2019-01-10 (×8): qty 2

## 2019-01-10 MED ORDER — ADULT MULTIVITAMIN W/MINERALS CH
1.0000 | ORAL_TABLET | Freq: Every day | ORAL | Status: DC
  Administered 2019-01-11 – 2019-01-16 (×6): 1 via ORAL
  Filled 2019-01-10 (×8): qty 1

## 2019-01-10 MED ORDER — LORAZEPAM 1 MG PO TABS
1.0000 mg | ORAL_TABLET | Freq: Four times a day (QID) | ORAL | Status: DC | PRN
  Administered 2019-01-10 – 2019-01-12 (×5): 1 mg via ORAL
  Filled 2019-01-10 (×5): qty 1

## 2019-01-10 MED ORDER — LORAZEPAM 2 MG/ML IJ SOLN
1.0000 mg | Freq: Once | INTRAMUSCULAR | Status: DC
  Filled 2019-01-10: qty 1

## 2019-01-10 MED ORDER — LORAZEPAM 1 MG PO TABS: 0.0000 mg | ORAL_TABLET | Freq: Two times a day (BID) | ORAL | Status: DC

## 2019-01-10 MED ORDER — PHENYTOIN SODIUM EXTENDED 100 MG PO CAPS
100.0000 mg | ORAL_CAPSULE | Freq: Two times a day (BID) | ORAL | Status: DC
  Administered 2019-01-10 – 2019-01-15 (×12): 100 mg via ORAL
  Filled 2019-01-10 (×12): qty 1

## 2019-01-10 MED ORDER — FOLIC ACID 1 MG PO TABS
1.0000 mg | ORAL_TABLET | Freq: Every day | ORAL | Status: DC
  Administered 2019-01-11 – 2019-01-16 (×6): 1 mg via ORAL
  Filled 2019-01-10 (×6): qty 1

## 2019-01-10 MED ORDER — NORETHIN ACE-ETH ESTRAD-FE 1-20 MG-MCG PO TABS: 1.0000 | ORAL_TABLET | Freq: Every day | ORAL | Status: DC

## 2019-01-10 MED ORDER — VITAMIN B-1 100 MG PO TABS: 100.0000 mg | ORAL_TABLET | Freq: Every day | ORAL | Status: DC

## 2019-01-10 MED ORDER — ACETAMINOPHEN 650 MG RE SUPP: 650.0000 mg | Freq: Four times a day (QID) | RECTAL | Status: DC | PRN

## 2019-01-10 MED ORDER — HALOPERIDOL LACTATE 5 MG/ML IJ SOLN
5.0000 mg | Freq: Once | INTRAMUSCULAR | Status: AC
  Administered 2019-01-10: 5 mg via INTRAVENOUS
  Filled 2019-01-10: qty 1

## 2019-01-10 MED ORDER — LORAZEPAM 2 MG/ML IJ SOLN: 0.0000 mg | Freq: Two times a day (BID) | INTRAMUSCULAR | Status: DC

## 2019-01-10 MED ORDER — LORAZEPAM 2 MG/ML IJ SOLN
0.0000 mg | Freq: Four times a day (QID) | INTRAMUSCULAR | Status: DC
  Filled 2019-01-10: qty 1

## 2019-01-10 MED ORDER — THIAMINE HCL 100 MG/ML IJ SOLN: 100.0000 mg | Freq: Every day | INTRAMUSCULAR | Status: DC

## 2019-01-10 NOTE — BH Assessment (Signed)
[facility] Assessment Progress Note Case was staffed with Akintayo MD who recommended a inpatient admission. Patient agrees to a voluntary admission at this time.

## 2019-01-10 NOTE — ED Triage Notes (Signed)
Patient arrived from home. Pt was brought to ED by [place]. Pt c/o ABD Pain and Anxiety.   Patient seems to be disturbed. Patient is difficult to console. Patient appears anxious.   PA at bedside talking to patient about plan of care.

## 2019-01-10 NOTE — ED Notes (Signed)
Patient stated she had three seizures before coming to the ED. Patient stated she doesn't remember when she had it. Patient stated that her Grandmother would be aware of when she had the seizures.

## 2019-01-10 NOTE — Progress Notes (Addendum)
Patient ID: Natasha Pope, female   DOB: 18-Sep-1995, 23 y.o.   MRN: [id number]  Pt seen by TTS and discussed with treatment team and Dr Darleene Cleaver. Pt has had 6 ED admissions and 2  inpatient admissions, in the past 6 months,  directly related to complications from her alcohol abuse. Pt has been discharged home, on each admission,  because she has stated she has a rehab facility that she is going to go to for substance abuse treatment. She continues to drink, take benzos, and have seizures. Per [facility], Pt has no active prescriptions for benzos but her UDS is positive for benzos during this admission.These were not given in the hospital today. Her BAL is 151 on admission at 10:09 AM.     Pt's grandmother is involved in her care and tries to help her in regards to her alcohol abuse. Pt has a history of seizures when intoxicated. Her last inpatient medical admission, 6/5 to 6/6, was for this reason and she was started on Dilantin 100 mg BID at that time. It is unclear if she is taking this medication. A Dilantin level has been ordered. She was referred to neurology by her PCP but it appears, from chart review, that she the appointment has not happened yet. She was also told not to drive until cleared by neurology.   Pt is recommended for an inpatient psychiatric admission for stabilization and medication management. Pt is, at this time, agreeable to a voluntary admission. If Pt decides she wants to leave the hospital please consider IVC for her safety and stability. Given her tendency to have seizures and her continued alcohol abuse she is placing herself at risk for harm and possible irreversible damage to her brain from seizure activity that may go unwitnessed.   Ethelene Hal, FNP-C 01/10/2019       1358 Patient seen face-to-face for psychiatric evaluation, chart reviewed and case discussed with the physician extender and developed treatment plan. Reviewed the information documented and agree  with the treatment plan. Corena Pilgrim, MD

## 2019-01-10 NOTE — H&P (Signed)
History and Physical    Dorthy CoolerVictoria Ann [place] WUJ:[id number] DOB: 06/08/1996 DOA: 01/10/2019  PCP: Orland MustardWolfe, Allison, MD  Patient coming from: Home  Chief Complaint: Alcohol intoxication and suicide ideation  HPI: Natasha Pope is a 23 y.o. female with medical history significant of previous psych hospitalizations, substance abuse, alcohol abuse who is brought in to the hospital by her grandmother due to nausea, vomiting, alcohol intoxication, suicidal ideation.  She has had 6 ED visits and 2 inpatient admissions in the past 6 months related to her alcohol abuse.  She admits to some nausea and vomiting, has not been able to take anything p.o. since yesterday.  States that she hurts all over, but no specific complaints.  Denies any fevers or shortness of breath.  ED Course: She was evaluated by psychiatry, they recommend inpatient psych hospitalization.  Due to her continued nausea, vomiting, EDP referred patient for admission for stabilization prior to transfer to inpatient psych placement.  Review of Systems: As per HPI otherwise 10 point review of systems negative.   Past Medical History:  Diagnosis Date  . Anxiety   . Asthma   . Depression   . Pyelonephritis   . Sepsis [facility](HCC)     Past Surgical History:  Procedure Laterality Date  . dental procedure       reports that she quit smoking about 8 months ago. Her smoking use included cigarettes. She smoked 0.50 packs per day. She has never used smokeless tobacco. She reports current alcohol use. She reports that she does not use drugs.  No Known Allergies  Family History  Problem Relation Age of Onset  . Depression Mother   . Miscarriages / [place] Mother   . Hypertension Father   . Alcohol abuse Father   . Learning disabilities Sister   . Hyperlipidemia Maternal Grandmother   . Alcohol abuse Maternal Grandfather   . Alcohol abuse Paternal Grandmother   . Hypertension Paternal Grandfather     Prior to Admission  medications   Medication Sig Start Date End Date Taking? Authorizing Provider  FLUoxetine (PROZAC) 40 MG capsule Take 1 capsule (40 mg total) by mouth daily. 12/24/18  Yes Orland MustardWolfe, Allison, MD  folic acid (FOLVITE) 1 MG tablet Take 1 tablet (1 mg total) by mouth daily. 12/24/18  Yes Orland MustardWolfe, Allison, MD  Multiple Vitamins-Minerals (ALIVE ONCE DAILY WOMENS) TABS Take 1 tablet by mouth daily.   Yes [provider]  norethindrone-ethinyl estradiol (JUNEL FE,GILDESS FE,LOESTRIN FE) 1-20 MG-MCG tablet Take 1 tablet by mouth daily. 09/17/18  Yes Orland MustardWolfe, Allison, MD  phenytoin (DILANTIN) 100 MG ER capsule Take 1 capsule (100 mg total) by mouth 2 (two) times daily. 12/27/18  Yes Rhetta MuraSamtani, Jai-Gurmukh, MD  Potassium Chloride ER 20 MEQ TBCR Take 20 mEq by mouth daily.  12/31/18  Yes Willow OraAndy, Camille L, MD  traZODone (DESYREL) 50 MG tablet Take 1 tablet (50 mg total) by mouth at bedtime as needed for sleep. 12/27/18  Yes Rhetta MuraSamtani, Jai-Gurmukh, MD    Physical Exam: Vitals:   01/10/19 1246 01/10/19 1329 01/10/19 1341 01/10/19 1712  BP: 128/87 (!) 123/108 126/88 (!) 159/116  Pulse: 95 98 78 86  Resp:  16 18 20   Temp:   99 F (37.2 C) 99 F (37.2 C)  TempSrc:   Oral Oral  SpO2:  97% 99% 97%  Weight:      Height:         Constitutional: NAD, calm, comfortable, tearful Eyes: PERRL, lids and conjunctivae normal ENMT: Mucous membranes  are moist. Posterior pharynx clear of any exudate or lesions.Normal dentition.  Neck: normal, supple, no masses, no thyromegaly Respiratory: clear to auscultation bilaterally, no wheezing, no crackles. Normal respiratory effort. No accessory muscle use.  Cardiovascular: Regular rate and rhythm, no murmurs / rubs / gallops. No extremity edema.  Abdomen: no tenderness, no masses palpated. No hepatosplenomegaly. Bowel sounds positive.  Musculoskeletal: no clubbing / cyanosis. No joint deformity upper and lower extremities.  Normal muscle tone.  Skin: no rashes, lesions, ulcers. No  induration Neurologic: CN 2-12 grossly intact.  Nonfocal.  Speech clear Psychiatric: Tearful  Labs on Admission: I have personally reviewed following labs and imaging studies  CBC: Recent Labs  Lab 01/10/19 1009  WBC 4.2  NEUTROABS 2.7  HGB 13.8  HCT 42.0  MCV 101.7*  PLT 172   Basic Metabolic Panel: Recent Labs  Lab 01/10/19 1009  NA 136  K 3.5  CL 98  CO2 20*  GLUCOSE 112*  BUN 10  CREATININE 0.64  CALCIUM 9.0   GFR: Estimated Creatinine Clearance: 103.3 mL/min (by C-G formula based on SCr of 0.64 mg/dL). Liver Function Tests: Recent Labs  Lab 01/10/19 1009  AST 238*  ALT 95*  ALKPHOS 144*  BILITOT 0.6  PROT 7.9  ALBUMIN 4.7   Recent Labs  Lab 01/10/19 1009  LIPASE 28   No results for input(s): AMMONIA in the last 168 hours. Coagulation Profile: No results for input(s): INR, PROTIME in the last 168 hours. Cardiac Enzymes: No results for input(s): CKTOTAL, CKMB, CKMBINDEX, TROPONINI in the last 168 hours. BNP (last 3 results) No results for input(s): PROBNP in the last 8760 hours. HbA1C: No results for input(s): HGBA1C in the last 72 hours. CBG: No results for input(s): GLUCAP in the last 168 hours. Lipid Profile: No results for input(s): CHOL, HDL, LDLCALC, TRIG, CHOLHDL, LDLDIRECT in the last 72 hours. Thyroid Function Tests: No results for input(s): TSH, T4TOTAL, FREET4, T3FREE, THYROIDAB in the last 72 hours. Anemia Panel: No results for input(s): VITAMINB12, FOLATE, FERRITIN, TIBC, IRON, RETICCTPCT in the last 72 hours. Urine analysis:    Component Value Date/Time   COLORURINE YELLOW 01/10/2019 1132   APPEARANCEUR CLEAR 01/10/2019 1132   LABSPEC 1.020 01/10/2019 1132   PHURINE 9.0 (H) 01/10/2019 1132   GLUCOSEU NEGATIVE 01/10/2019 1132   HGBUR NEGATIVE 01/10/2019 1132   BILIRUBINUR NEGATIVE 01/10/2019 1132   BILIRUBINUR Negative 12/24/2018 1147   KETONESUR NEGATIVE 01/10/2019 1132   PROTEINUR 30 (A) 01/10/2019 1132   UROBILINOGEN 0.2  12/24/2018 1147   NITRITE NEGATIVE 01/10/2019 1132   LEUKOCYTESUR NEGATIVE 01/10/2019 1132   Sepsis Labs: !!!!!!!!!!!!!!!!!!!!!!!!!!!!!!!!!!!!!!!!!!!! @LABRCNTIP (procalcitonin:4,lacticidven:4) )No results found for this or any previous visit (from the past 240 hour(s)).   Radiological Exams on Admission: No results found.  EKG: Independently reviewed. Sinus tachycardia rate 112, QTc 494   Assessment/Plan Principal Problem:   Alcohol withdrawal (HCC) Active Problems:   Tobacco abuse   Alcohol abuse   Alcohol use with alcohol-induced mood disorder (HCC)   Seizure (HCC)   Alcohol intoxication and withdrawal -Due to nausea and vomiting in the ED, she may require IV Ativan -CIWA protocol   Hx of alcohol withdrawal seizures -Continue dilantin  -Has been referred to neurology by PCP  Suicidal ideation -Evaluated by psych in the ED, recommend inpatient psych hospitalization. Currently voluntary but if patient wants to leave, will need IVC for safety  -Sitter at bedside and suicide precautions  -Continue prozac   QTc prolongation -Avoid QTc prolonging medication  DVT prophylaxis: SCD Code Status: Full  Family Communication: None Disposition Plan: Pending improvement and medical stabilization, inpatient psych once ready for discharge Consults called: None   Admission status: Inpatient   Severity of Illness: * I certify that at the point of admission it is my clinical judgment that the patient will require inpatient hospital care spanning beyond 2 midnights from the point of admission due to high intensity of service, high risk for further deterioration and high frequency of surveillance required.Dessa Phi, DO [facility] 01/10/2019, 6:12 PM    How to contact the [facility] Attending or Consulting provider [place] or covering provider during after hours [place], for this patient?  1. Check the care team in [facility] and look for a) attending/consulting [facility] provider  listed and b) the [facility] team listed 2. Log into [email or web address] and use [facility]'s universal password to access. If you do not have the password, please contact the hospital operator. 3. Locate the [facility] provider you are looking for under [facility] and page to a number that you can be directly reached. 4. If you still have difficulty reaching the provider, please page the [facility] (Director on Call) for the Hospitalists listed on amion for assistance.

## 2019-01-10 NOTE — ED Notes (Signed)
Patient asleep at this time and scored a 0 on her CIWA

## 2019-01-10 NOTE — BH Assessment (Signed)
Assessment Note  Natasha Pope is an 23 y.o. female that presents this date actively impaired voluntary with S/I. Patient denies any plan or intent although voices passive S/I. Patient nods her head "yes" when asked if she had thoughts of self harm. This Probation officer clarified patient's S/I by utilizing probing questions since this writer felt she may not be comprehending the content of question/s. Patient reports she had consumed a large amount of alcohol prior to arrival to "get some sleep" and "did not care if she woke up or not." Patient is vague in reference to amount consumed or time frame with BAL to be noted at 151. Patient's UDS is pending. Patient denies any other SA use. Patient renders limited history and is very drowsy as this Probation officer attempts to assess. Patient gives consent to speak to grandmother Natasha Pope [phone] for collateral. Patient's grandmother reports patient has been drinking alcohol daily for the last two weeks although is unsure of amounts consumed. Patient's grandmother states that patient has been "off and on her medications" and she has concerns in reference to patient using her seizure medication with alcohol. Patient's grandmother reports patient has been very depressed over the last week and has been voicing passive thoughts of self harm. Patient's grandmother is very concerned and feels the patient is in need of a inpatient admission. Per notes, patient was last seen on 12/09/18 at [facility] for a panic attack and excessive alcohol use. Patient has a history of depression and has been receiving services from [facility] that assists with medication management. Patient reports current compliance. Patient did not meet inpatient criteria at that time although attending provider had concerns in reference to patient's high risk behaviors at that time and informed patient if she presented again would be recommended for a inpatient admission. Patient at that time was very hesitant  answering questions in reference to self harm and reports one prior attempt although will not elaborate on that incident. Patient states she has a history of self injurious behaviors (cutting) although reports she has not cut in over one year. Patient per chart had a court date on 01/08/19 for a DWI although it is unclear if patient appeared that date or had that issue resolved. Patient gave clinician verbal consent to contact her maternal grandmother and expresses the desire to have her grandmother involved in her treatment efforts. The information obtained from patient's grandmother can be found in the assessment above. Patient is oriented to place only and speaks in a low soft voice rendering limited history. Case was staffed with Natasha Pope who recommended a inpatient admission. Patient agrees to a voluntary admission at this time.    Diagnosis: F33.2 MDD recurrent without psychotic features, severe, F10.24, Alcohol-induced depressive disorder, With moderate or severe use disorder   Past Medical History:  Past Medical History:  Diagnosis Date  . Anxiety   . Asthma   . Depression   . Pyelonephritis   . Sepsis [facility])     Past Surgical History:  Procedure Laterality Date  . dental procedure      Family History:  Family History  Problem Relation Age of Onset  . Depression Mother   . Miscarriages / [place] Mother   . Hypertension Father   . Alcohol abuse Father   . Learning disabilities Sister   . Hyperlipidemia Maternal Grandmother   . Alcohol abuse Maternal Grandfather   . Alcohol abuse Paternal Grandmother   . Hypertension Paternal Grandfather     Social History:  reports that she quit smoking about 8 months ago. Her smoking use included cigarettes. She smoked 0.50 packs per day. She has never used smokeless tobacco. She reports current alcohol use. She reports that she does not use drugs.  Additional Social History:  Alcohol / Drug Use Pain Medications: See  MAR Prescriptions: See MAR Over the Counter: See MAR History of alcohol / drug use?: Yes Longest period of sobriety (when/how long): UTA Negative Consequences of Use: (Denies) Withdrawal Symptoms: (Denies) Substance #1 Name of Substance 1: Alcohol 1 - Age of First Use: UTA 1 - Amount (size/oz): UTA 1 - Frequency: UTA 1 - Duration: UTA 1 - Last Use / Amount: UTA BAL pending  CIWA: CIWA-Ar BP: 128/87 Pulse Rate: 95 Nausea and Vomiting: no nausea and no vomiting Tactile Disturbances: none Tremor: no tremor Auditory Disturbances: not present Paroxysmal Sweats: no sweat visible Visual Disturbances: not present Anxiety: no anxiety, at ease Headache, Fullness in Head: none present Agitation: normal activity Orientation and Clouding of Sensorium: oriented and can do serial additions CIWA-Ar Total: 0 COWS:    Allergies: No Known Allergies  Home Medications: (Not in a hospital admission)   OB/GYN Status:  No LMP recorded (lmp unknown).  General Assessment Data Location of Assessment: [facility] TTS Assessment: In system Is this a Tele or Face-to-Face Assessment?: Face-to-Face Is this an Initial Assessment or a Re-assessment for this encounter?: Initial Assessment Patient Accompanied by:: N/A Language Other than English: No Living Arrangements: Other (Comment)(Alone) What gender do you identify as?: Female Marital status: Single Pregnancy Status: No Living Arrangements: Alone Can pt return to current living arrangement?: Yes Admission Status: Voluntary Is patient capable of signing voluntary admission?: Yes Referral Source: Self/Family/Friend Insurance type: AENTA     Crisis Care Plan Living Arrangements: Alone Legal Guardian: (NA) Name of Psychiatrist: Artis FlockWolfe Pope Name of Therapist: None  Education Status Is patient currently in school?: No Is the patient employed, unemployed or receiving disability?: Unemployed  Risk to self with the past 6 months Suicidal  Ideation: Yes-Currently Present Has patient been a risk to self within the past 6 months prior to admission? : No Suicidal Intent: No Has patient had any suicidal intent within the past 6 months prior to admission? : No Is patient at risk for suicide?: Yes Suicidal Plan?: No Has patient had any suicidal plan within the past 6 months prior to admission? : No Access to Means: No What has been your use of drugs/alcohol within the last 12 months?: Current use Previous Attempts/Gestures: Yes How many times?: 1 Other Self Harm Risks: None Triggers for Past Attempts: Unknown Intentional Self Injurious Behavior: Cutting Comment - Self Injurious Behavior: Per hx Family Suicide History: Yes Recent stressful life event(s): Other (Comment)(Excessive SA use) Persecutory voices/beliefs?: No Depression: No Depression Symptoms: (Denies) Substance abuse history and/or treatment for substance abuse?: Yes Suicide prevention information given to non-admitted patients: Not applicable  Risk to Others within the past 6 months Homicidal Ideation: No Does patient have any lifetime risk of violence toward others beyond the six months prior to admission? : No Thoughts of Harm to Others: No Current Homicidal Intent: No Current Homicidal Plan: No Access to Homicidal Means: No Identified Victim: NA History of harm to others?: No Assessment of Violence: None Noted Violent Behavior Description: NA Does patient have access to weapons?: No Criminal Charges Pending?: No Does patient have a court date: No Court Date: (NA) Is patient on probation?: Yes  Psychosis Hallucinations: None noted Delusions: None noted  Mental Status Report Appearance/Hygiene: Unremarkable Eye Contact: Unable to Assess Motor Activity: Unsteady Speech: Slow, Slurred Level of Consciousness: Drowsy Mood: Depressed Affect: Blunted Anxiety Level: Minimal Thought Processes: Unable to Assess Judgement: Unable to  Assess Orientation: Unable to assess Obsessive Compulsive Thoughts/Behaviors: Minimal  Cognitive Functioning Concentration: Unable to Assess Memory: Unable to Assess Is patient IDD: No Insight: Unable to Assess Impulse Control: Unable to Assess Appetite: (UTA) Have you had any weight changes? : (UTA) Sleep: (UTA) Total Hours of Sleep: (UTA) Vegetative Symptoms: (UTA)  ADLScreening [facility] Assessment Services) Patient's cognitive ability adequate to safely complete daily activities?: Yes Patient able to express need for assistance with ADLs?: Yes Independently performs ADLs?: Yes (appropriate for developmental age)  Prior Inpatient Therapy Prior Inpatient Therapy: No  Prior Outpatient Therapy Prior Outpatient Therapy: Yes Prior Therapy Dates: Unknown Prior Therapy Facilty/Provider(s): Unknown Reason for Treatment: Med mang Does patient have an ACCT team?: No Does patient have Intensive In-House Services?  : No Does patient have [facility] services? : No Does patient have P4CC services?: No  ADL Screening (condition at time of admission) Patient's cognitive ability adequate to safely complete daily activities?: Yes Is the patient deaf or have difficulty hearing?: No Does the patient have difficulty seeing, even when wearing glasses/contacts?: No Does the patient have difficulty concentrating, remembering, or making decisions?: No Patient able to express need for assistance with ADLs?: Yes Does the patient have difficulty dressing or bathing?: No Independently performs ADLs?: Yes (appropriate for developmental age) Does the patient have difficulty walking or climbing stairs?: No Weakness of Legs: None Weakness of Arms/Hands: None  Home Assistive Devices/Equipment Home Assistive Devices/Equipment: None  Therapy Consults (therapy consults require a physician order) PT Evaluation Needed: No OT Evalulation Needed: No SLP Evaluation Needed: No Abuse/Neglect Assessment  (Assessment to be complete while patient is alone) Physical Abuse: Denies Verbal Abuse: Denies Sexual Abuse: Denies Exploitation of patient/patient's resources: Denies Self-Neglect: Denies Values / Beliefs Cultural Requests During Hospitalization: None Spiritual Requests During Hospitalization: None Consults Spiritual Care Consult Needed: No Social Work Consult Needed: No Merchant navy officerAdvance Directives (For Healthcare) Does Patient Have a Medical Advance Directive?: No Would patient like information on creating a medical advance directive?: No - Patient declined          Disposition: Case was staffed with Natasha Pope who recommended a inpatient admission. Patient agrees to a voluntary admission at this time.  Disposition Initial Assessment Completed for this Encounter: Yes Disposition of Patient: Admit Type of inpatient treatment program: Adult Patient refused recommended treatment: No Mode of transportation if patient is discharged/movement?: [employer](Car)  On Site Evaluation by:   Reviewed with Physician:    Alfredia Fergusonavid L Guy Toney 01/10/2019 1:14 PM

## 2019-01-10 NOTE — ED Notes (Signed)
Patient is very agitated. Patient continues to pace back and forth. Patient states "I need to go" continuously. Patient is hard to console. Patient has to be guided by multiple staff members. Patient was able to sit on bed after being told multiple times by staff to calm down and have a seat. Patient appears very paranoid, agitated, and difficult to console.   Patient has N/V.

## 2019-01-10 NOTE — ED Notes (Signed)
Patient resting with eyes closed now.  Reports she is feeling better.  Hospitalist at bedside.

## 2019-01-10 NOTE — ED Notes (Signed)
Patient is currently calm and cooperative. Patient is resting.

## 2019-01-10 NOTE — ED Provider Notes (Addendum)
[facility] DEPT Provider Note   CSN: [id number] Arrival date & time: 01/10/19  4492     History   Chief Complaint Chief Complaint  Patient presents with  . Anxiety  . Abdominal Pain    HPI Natasha Pope is a 23 y.o. female with a hx of seizures, asthma, tobacco use, anxiety, depression, & substance induced mood disorder who arrives to the ED after being dropped off by her grandmother for nausea & vomiting.   Patient is very agitated, continually stating "I need to leave" and not fully participating in the history leading to LEVEL 5 CAVEAT. She reports to me that she woke up at 04:45 AM & has had TNTC episodes of vomiting w/ generalized abdominal pain. Reports a few episodes of loose stools. Denies blood in emesis or stool. She reports feeling anxious & like her heart is racing. She states last used EtOH 2 days prior, denies drug use. She is very hesitant to allow staff to check vitals/examine her. She continually asks me if the medicines we will give her will maker her "feel weird."   I called & spoke with patient's grandmother via telephone: she corroborates history of N/V/D w/ abdominal pain since early this morning. She is concerned patient is drinking again and that she needs help with her alcohol use. Recent hospital admission for seizure- possibly related to EtOH or benzo use, per chart review she was started on dilantin. Her grandmother states her prozac has been increased by PCP. She has not made comments of SI/HI at home. She has not had any ativan since leaving hospital that she is aware of, she is asking we not give the patient his medicine as she becomes more irritable on it. Has not witnessed any seizure activity.     HPI  Past Medical History:  Diagnosis Date  . Anxiety   . Asthma   . Depression   . Pyelonephritis   . Sepsis [facility])     Patient Active Problem List   Diagnosis Date Noted  . Alcohol use disorder, severe,  dependence ([place]) 12/27/2018  . Recurrent major depressive disorder ([place]) 12/27/2018  . Seizure ([place]) 12/26/2018  . Alcohol use with alcohol-induced mood disorder ([place])   . Substance induced mood disorder ([place]) 12/09/2018  . Alcohol abuse 12/09/2018  . Acute pyelonephritis 05/19/2018  . Hypokalemia 05/19/2018  . Hypocalcemia 05/19/2018  . Hypomagnesemia 05/19/2018  . Macrocytic anemia 05/19/2018  . Electrolyte disturbance 05/19/2018  . Thrombocytopenia ([place]) 05/19/2018  . Alcohol use 05/19/2018  . ASCUS of cervix with negative high risk HPV 03/07/2018  . Tobacco abuse 01/31/2018  . Asthma 01/31/2018  . GAD (generalized anxiety disorder) 01/31/2018    Past Surgical History:  Procedure Laterality Date  . dental procedure       OB History   No obstetric history on file.      Home Medications    Prior to Admission medications   Medication Sig Start Date End Date Taking? Authorizing Provider  FLUoxetine (PROZAC) 40 MG capsule Take 1 capsule (40 mg total) by mouth daily. 12/24/18   Orma Flaming, MD  folic acid (FOLVITE) 1 MG tablet Take 1 tablet (1 mg total) by mouth daily. 12/24/18   Orma Flaming, MD  ibuprofen (ADVIL) 200 MG tablet Take 400-600 mg by mouth every 6 (six) hours as needed for headache or cramping (pain).    [provider]  norethindrone-ethinyl estradiol (JUNEL FE,GILDESS FE,LOESTRIN FE) 1-20 MG-MCG tablet Take 1 tablet by mouth  daily. 09/17/18   Orma Flaming, MD  ondansetron (ZOFRAN ODT) 4 MG disintegrating tablet Take 1 tablet (4 mg total) by mouth every 8 (eight) hours as needed for nausea or vomiting. 12/25/18   Charlann Lange, PA-C  phenytoin (DILANTIN) 100 MG ER capsule Take 1 capsule (100 mg total) by mouth 2 (two) times daily. 12/27/18   Nita Sells, MD  Potassium Chloride ER 20 MEQ TBCR Take 20 mEq by mouth 2 (two) times daily. 12/31/18   Leamon Arnt, MD  thiamine (VITAMIN B-1) 100 MG tablet Take 1 tablet (100 mg total) by mouth daily.  12/24/18   Orma Flaming, MD  traZODone (DESYREL) 50 MG tablet Take 1 tablet (50 mg total) by mouth at bedtime as needed for sleep. 12/27/18   Nita Sells, MD    Family History Family History  Problem Relation Age of Onset  . Depression Mother   . Miscarriages / [place] Mother   . Hypertension Father   . Alcohol abuse Father   . Learning disabilities Sister   . Hyperlipidemia Maternal Grandmother   . Alcohol abuse Maternal Grandfather   . Alcohol abuse Paternal Grandmother   . Hypertension Paternal Grandfather     Social History Social History   Tobacco Use  . Smoking status: Former Smoker    Packs/day: 0.50    Types: Cigarettes    Quit date: 05/10/2018    Years since quitting: 0.6  . Smokeless tobacco: Never Used  Substance Use Topics  . Alcohol use: Yes    Comment: 2-3 days/week  . Drug use: No     Allergies   Patient has no known allergies.   Review of Systems Review of Systems  Unable to perform ROS: Psychiatric disorder  Gastrointestinal: Positive for abdominal pain, diarrhea, nausea and vomiting.  Psychiatric/Behavioral: The patient is nervous/anxious.     Physical Exam Updated Vital Signs BP (!) 125/102 (BP Location: Left Arm)   Pulse (!) 106   Temp 98.4 F (36.9 C)   Resp (!) 25   Ht _0  (1.676 m)   Wt 65.8 kg   LMP  (LMP Unknown)   SpO2 97%   BMI 23.40 kg/m   Physical Exam Vitals signs and nursing note reviewed. Exam conducted with a chaperone present.  Constitutional:      Appearance: She is not ill-appearing or toxic-appearing.  HENT:     Head: Normocephalic and atraumatic. No raccoon eyes or Battle's sign.  Eyes:     Pupils: Pupils are equal, round, and reactive to light.  Neck:     Musculoskeletal: Neck supple. No neck rigidity.  Cardiovascular:     Rate and Rhythm: Regular rhythm. Tachycardia present.  Pulmonary:     Breath sounds: Normal breath sounds. No wheezing, rhonchi or rales.     Comments: Patient is  intermittently hyperventilating.  Abdominal:     General: Bowel sounds are normal.     Palpations: Abdomen is soft.     Tenderness: There is generalized abdominal tenderness (mild). There is no guarding or rebound.  Skin:    General: Skin is warm and dry.  Neurological:     Mental Status: She is alert.     Comments: Moving all extremities. No seizure activity. Ambulatory.   Psychiatric:        Mood and Affect: Mood is anxious.        Behavior: Behavior is agitated and hyperactive.     Comments: Uncooperative.     ED Treatments / Results  Labs (  all labs ordered are listed, but only abnormal results are displayed) Labs Reviewed  CBC WITH DIFFERENTIAL/PLATELET - Abnormal; Notable for the following components:      Result Value   MCV 101.7 (*)    All other components within normal limits  COMPREHENSIVE METABOLIC PANEL - Abnormal; Notable for the following components:   CO2 20 (*)    Glucose, Bld 112 (*)    AST 238 (*)    ALT 95 (*)    Alkaline Phosphatase 144 (*)    Anion gap 18 (*)    All other components within normal limits  URINALYSIS, ROUTINE W REFLEX MICROSCOPIC - Abnormal; Notable for the following components:   pH 9.0 (*)    Protein, ur 30 (*)    Bacteria, UA RARE (*)    All other components within normal limits  RAPID URINE DRUG SCREEN, HOSP PERFORMED - Abnormal; Notable for the following components:   Benzodiazepines POSITIVE (*)    All other components within normal limits  ETHANOL - Abnormal; Notable for the following components:   Alcohol, Ethyl (B) 151 (*)    All other components within normal limits  ACETAMINOPHEN LEVEL - Abnormal; Notable for the following components:   Acetaminophen (Tylenol), Serum <10 (*)    All other components within normal limits  LIPASE, BLOOD  SALICYLATE LEVEL  PHENYTOIN LEVEL, TOTAL  PHENYTOIN LEVEL, TOTAL  I-STAT BETA HCG BLOOD, ED ([facility], [facility], [facility] ONLY)  I-STAT BETA HCG BLOOD, ED ([facility], [facility], [facility] ONLY)    EKG   EKG Interpretation   Date/Time:  Saturday January 10 2019 09:49:18 EDT Ventricular Rate:  95 PR Interval:    QRS Duration: 88 QT Interval:  417 QTC Calculation: 525 R Axis:   80 Text Interpretation:  Sinus rhythm Prolonged QT interval Confirmed by Virgel Manifold [phone]) on 01/10/2019 1:44:07 PM  Radiology No results found.  Procedures Procedures (including critical care time)  Medications Ordered in ED Medications  LORazepam (ATIVAN) injection 0-4 mg (0 mg Intravenous Not Given 01/10/19 1248)    Or  LORazepam (ATIVAN) tablet 0-4 mg ( Oral See Alternative 01/10/19 1248)  LORazepam (ATIVAN) injection 0-4 mg (has no administration in time range)    Or  LORazepam (ATIVAN) tablet 0-4 mg (has no administration in time range)  thiamine (VITAMIN B-1) tablet 100 mg (100 mg Oral Given 01/10/19 1252)    Or  thiamine (B-1) injection 100 mg ( Intravenous See Alternative 01/10/19 1252)  ondansetron (ZOFRAN) tablet 4 mg (has no administration in time range)  alum & mag hydroxide-simeth (MAALOX/MYLANTA) 200-200-20 MG/5ML suspension 30 mL (has no administration in time range)  FLUoxetine (PROZAC) capsule 40 mg (40 mg Oral Given 01/12/28 7989)  folic acid (FOLVITE) tablet 1 mg (1 mg Oral Given 01/10/19 1252)  multivitamin with minerals tablet 1 tablet (has no administration in time range)  norethindrone-ethinyl estradiol (LOESTRIN FE) 1-20 MG-MCG per tablet 1 tablet (has no administration in time range)  phenytoin (DILANTIN) ER capsule 100 mg (100 mg Oral Given 01/10/19 1252)  potassium chloride (K-DUR) CR tablet 20 mEq (has no administration in time range)  traZODone (DESYREL) tablet 50 mg (has no administration in time range)  sodium chloride 0.9 % bolus 1,000 mL (0 mLs Intravenous Stopped 01/10/19 1254)  haloperidol lactate (HALDOL) injection 5 mg (5 mg Intravenous Given 01/10/19 1012)      Initial Impression / Assessment and Plan / ED Course  I have reviewed the triage vital signs and the nursing notes.  Pertinent labs  &  imaging results that were available during my care of the patient were reviewed by me and considered in my medical decision making (see chart for details).   09:35: Initial evaluation: Patient presents w/ N/V & abdominal pain as well as anxiety. She is extremely agitated, uncooperative. Discussed w/ her grandmother via telephone to corroborate history. Chart reviewed for additional information. Multiple ER visits within past 6 months, several w/ similar agitation. She does not appear toxic. She is tachycardic, tachypnic, afebrile, not hypotensive. Abdomen with mild generalized tenderness, but without peritoneal signs. Plan for labs, fluids, re-assessment. Considering ativan vs. Haldol for symptomatic control.   09:50: Discussed w/ Dr. Wilson Singer, recommends Haldol for agitation & N/V which I am in agreement with.   QTc prolonged on EKG, will ensure to keep patient on the cardiac monitor given haldol is a QT prolongating medicine.   RN informed me that patient told her she had 3 seizures, I clarified with the patient once she became more calm and she states she has had 3 seizures in the past, has not had any seizures within the past 24 hours. Her grandmother confirmed this.   Preg test: Negative CBC: No leukocytosis or significant anemia.  CMP: mild acidosis w/ bicarb of 20, anion gap elevation @ 18- likely secondary to N/V, receiving IVFs in the ER. No significant electrolyte disturbance. AST/ALT elevation which is new in comparison to prior labs, close to a 2:1 ratio w/ hx of EtOH abuse suspect this is underlying cause. Mild elevation in alk phos.  Lipase: WNL Tylenol/salicylate level WNL Ethanol elevated @ 151.  UA/UDS: pending.   10:29: RE-EVAL: Patient resting comfortably on stretcher.  11:20: RE-EVAL: Patient continues to rest comfortably, no emesis s/p haldol, she states she is feeling better. Repeat abdominal exam w/o peritoneal signs, no focal RUQ tenderness, negative murphys. Do not suspect  her LFT elevations are due to acute cholecystitis, cholangitis, or choledocholithiasis. Additionally doubt pancreatitis, perf, obstruction, appendicitis, ectopic pregnancy, or PID.   Cardiac monitor remains reassuring.  Plan for fluid challenge w/ TTS evaluation. Peer support consult placed as well.  UA: No UTI UDS: + for benzos.  Tolerating PO. Medically cleared  Patient seen by [facility] for voluntary inpatient admission, looking for placement.  Psych holding orders including CIWA & home medications have been ordered.     Findings and plan of care discussed with supervising physician Dr. Wilson Singer who is in agreement.   Covid swab ordered as requested by social work for placement.   Natasha Pope was evaluated in Emergency Department on 01/10/2019 for the symptoms described in the history of present illness. He/she was evaluated in the context of the global COVID-19 pandemic, which necessitated consideration that the patient might be at risk for infection with the SARS-CoV-2 virus that causes COVID-19. Institutional protocols and algorithms that pertain to the evaluation of patients at risk for COVID-19 are in a state of rapid change based on information released by regulatory bodies including the CDC and federal and state organizations. These policies and algorithms were followed during the patient's care in the ED.  Final Clinical Impressions(s) / ED Diagnoses   Final diagnoses:  Non-intractable vomiting with nausea, unspecified vomiting type  Anxiety    ED Discharge Orders    None       Amaryllis Dyke, PA-C 01/10/19 1347    Amaryllis Dyke, PA-C 01/10/19 1616    Virgel Manifold, MD 01/11/19 [phone]

## 2019-01-10 NOTE — ED Notes (Signed)
Attempted to obtain EKG on patient but patient continued with shaking and nausea and vomiting.  Dr. Alvino Chapel notified.

## 2019-01-10 NOTE — ED Notes (Signed)
Report given to [facility].  Patient to be transported to 5th floor after change of shift.

## 2019-01-10 NOTE — Progress Notes (Signed)
RN spoke with grandmother, Natasha Pope [phone]. Grandmother is concerned pt drinking behavior may be a way of dealing with past traumatic events. Grandmother wanted MD to be aware to help address psych issue.

## 2019-01-10 NOTE — ED Notes (Addendum)
Patient's belongings placed in patient belongings bag. Patient has 2 bracelets, phone, sandals, and clothing placed in bag. Bag will be placed in TCU locker.

## 2019-01-10 NOTE — ED Notes (Addendum)
Patient arrived in [facility] room 29.  Patient alert, oriented but sleepy. VSS.

## 2019-01-10 NOTE — ED Provider Notes (Signed)
  Physical Exam  BP (!) 159/116 (BP Location: Right Arm)   Pulse 86   Temp 99 F (37.2 C) (Oral)   Resp 20   Ht 5\' 6"  (1.676 m)   Wt 65.8 kg   LMP  (LMP Unknown)   SpO2 97%   BMI 23.40 kg/m   Physical Exam  ED Course/Procedures     Procedures  MDM  Have been called to see patient for nausea and vomiting.  Patient has mild tremors.  Patient's nurse states that they were unable to get an EKG because of this.  Patient has had QT prolongation on EKG earlier today and had had Haldol 2.  However with QT prolongation I feel that we would attempt to avoid medications with QT prolonging gating effects.  Patient began to be more tremulous.  History of benzo abuse and alcohol use.  Has had seizures in the past.  I feel patient is not able to tolerate the orals for oral benzodiazepines at this time.  Will admit to hospital for IV treatment.  I have called to behavioral health and talk to the [facility], since she was pending transfer over to their.       Davonna Belling, MD 01/10/19 1745

## 2019-01-10 NOTE — Progress Notes (Signed)
This patient continues to meet inpatient criteria. CSW faxed information to the following facilities:   [facility]  [facility] is reviewing patient.  Stephanie Acre, [place] Social Worker [phone]

## 2019-01-11 DIAGNOSIS — F10239 Alcohol dependence with withdrawal, unspecified: Secondary | ICD-10-CM | POA: Diagnosis present

## 2019-01-11 DIAGNOSIS — F339 Major depressive disorder, recurrent, unspecified: Secondary | ICD-10-CM | POA: Diagnosis present

## 2019-01-11 DIAGNOSIS — Y906 Blood alcohol level of 120-199 mg/100 ml: Secondary | ICD-10-CM | POA: Diagnosis present

## 2019-01-11 DIAGNOSIS — R000 Tachycardia, unspecified: Secondary | ICD-10-CM | POA: Diagnosis not present

## 2019-01-11 DIAGNOSIS — F410 Panic disorder [episodic paroxysmal anxiety] without agoraphobia: Secondary | ICD-10-CM | POA: Diagnosis not present

## 2019-01-11 DIAGNOSIS — F10232 Alcohol dependence with withdrawal with perceptual disturbance: Secondary | ICD-10-CM | POA: Diagnosis not present

## 2019-01-11 DIAGNOSIS — F1014 Alcohol abuse with alcohol-induced mood disorder: Secondary | ICD-10-CM | POA: Diagnosis not present

## 2019-01-11 DIAGNOSIS — F419 Anxiety disorder, unspecified: Secondary | ICD-10-CM | POA: Diagnosis present

## 2019-01-11 DIAGNOSIS — F1094 Alcohol use, unspecified with alcohol-induced mood disorder: Secondary | ICD-10-CM | POA: Diagnosis not present

## 2019-01-11 DIAGNOSIS — R45851 Suicidal ideations: Secondary | ICD-10-CM | POA: Diagnosis present

## 2019-01-11 DIAGNOSIS — Z87891 Personal history of nicotine dependence: Secondary | ICD-10-CM | POA: Diagnosis not present

## 2019-01-11 DIAGNOSIS — F13288 Sedative, hypnotic or anxiolytic dependence with other sedative, hypnotic or anxiolytic-induced disorder: Secondary | ICD-10-CM | POA: Diagnosis not present

## 2019-01-11 DIAGNOSIS — J45909 Unspecified asthma, uncomplicated: Secondary | ICD-10-CM | POA: Diagnosis present

## 2019-01-11 DIAGNOSIS — R9431 Abnormal electrocardiogram [ECG] [EKG]: Secondary | ICD-10-CM | POA: Diagnosis present

## 2019-01-11 DIAGNOSIS — Z1159 Encounter for screening for other viral diseases: Secondary | ICD-10-CM | POA: Diagnosis not present

## 2019-01-11 DIAGNOSIS — I10 Essential (primary) hypertension: Secondary | ICD-10-CM | POA: Diagnosis present

## 2019-01-11 DIAGNOSIS — F1010 Alcohol abuse, uncomplicated: Secondary | ICD-10-CM | POA: Diagnosis not present

## 2019-01-11 DIAGNOSIS — Z818 Family history of other mental and behavioral disorders: Secondary | ICD-10-CM | POA: Diagnosis not present

## 2019-01-11 DIAGNOSIS — Z79899 Other long term (current) drug therapy: Secondary | ICD-10-CM | POA: Diagnosis not present

## 2019-01-11 DIAGNOSIS — F1024 Alcohol dependence with alcohol-induced mood disorder: Secondary | ICD-10-CM | POA: Diagnosis present

## 2019-01-11 DIAGNOSIS — Z888 Allergy status to other drugs, medicaments and biological substances status: Secondary | ICD-10-CM | POA: Diagnosis not present

## 2019-01-11 DIAGNOSIS — R443 Hallucinations, unspecified: Secondary | ICD-10-CM | POA: Diagnosis not present

## 2019-01-11 DIAGNOSIS — E876 Hypokalemia: Secondary | ICD-10-CM | POA: Diagnosis not present

## 2019-01-11 DIAGNOSIS — F1994 Other psychoactive substance use, unspecified with psychoactive substance-induced mood disorder: Secondary | ICD-10-CM | POA: Diagnosis present

## 2019-01-11 DIAGNOSIS — F10230 Alcohol dependence with withdrawal, uncomplicated: Secondary | ICD-10-CM | POA: Diagnosis not present

## 2019-01-11 DIAGNOSIS — R569 Unspecified convulsions: Secondary | ICD-10-CM | POA: Diagnosis present

## 2019-01-11 DIAGNOSIS — Z811 Family history of alcohol abuse and dependence: Secondary | ICD-10-CM | POA: Diagnosis not present

## 2019-01-11 DIAGNOSIS — F10229 Alcohol dependence with intoxication, unspecified: Secondary | ICD-10-CM | POA: Diagnosis present

## 2019-01-11 LAB — BASIC METABOLIC PANEL
Anion gap: 12 (ref 5–15)
BUN: 5 mg/dL — ABNORMAL LOW (ref 6–20)
CO2: 22 mmol/L (ref 22–32)
Calcium: 9 mg/dL (ref 8.9–10.3)
Chloride: 101 mmol/L (ref 98–111)
Creatinine, Ser: 0.56 mg/dL (ref 0.44–1.00)
GFR calc Af Amer: >60 mL/min (ref >60)
GFR calc non Af Amer: >60 mL/min (ref >60)
Glucose, Bld: 100 mg/dL — ABNORMAL HIGH (ref 70–99)
Potassium: 3.1 mmol/L — ABNORMAL LOW (ref 3.5–5.1)
Sodium: 135 mmol/L (ref 135–145)

## 2019-01-11 LAB — CBC
HCT: 39.9 % (ref 36.0–46.0)
Hemoglobin: 13.2 g/dL (ref 12.0–15.0)
MCH: 34.2 pg — ABNORMAL HIGH (ref 26.0–34.0)
MCHC: 33.1 g/dL (ref 30.0–36.0)
MCV: 103.4 fL — ABNORMAL HIGH (ref 80.0–100.0)
Platelets: 125 10*3/uL — ABNORMAL LOW (ref 150–400)
RBC: 3.86 MIL/uL — ABNORMAL LOW (ref 3.87–5.11)
RDW: 14.6 % (ref 11.5–15.5)
WBC: 3.3 10*3/uL — ABNORMAL LOW (ref 4.0–10.5)
nRBC: 0 % (ref 0.0–0.2)

## 2019-01-11 LAB — MAGNESIUM: Magnesium: 1.8 mg/dL (ref 1.7–2.4)

## 2019-01-11 MED ORDER — PROMETHAZINE HCL 25 MG/ML IJ SOLN
12.5000 mg | Freq: Four times a day (QID) | INTRAMUSCULAR | Status: DC | PRN
  Administered 2019-01-11 – 2019-01-13 (×4): 12.5 mg via INTRAVENOUS
  Filled 2019-01-11 (×5): qty 1

## 2019-01-11 MED ORDER — POTASSIUM CHLORIDE CRYS ER 20 MEQ PO TBCR
40.0000 meq | EXTENDED_RELEASE_TABLET | ORAL | Status: AC
  Administered 2019-01-11 (×2): 40 meq via ORAL
  Filled 2019-01-11 (×2): qty 2

## 2019-01-11 MED ORDER — HYDRALAZINE HCL 20 MG/ML IJ SOLN
5.0000 mg | INTRAMUSCULAR | Status: DC | PRN
  Administered 2019-01-12 – 2019-01-13 (×2): 5 mg via INTRAVENOUS
  Filled 2019-01-11 (×3): qty 1

## 2019-01-11 NOTE — Progress Notes (Addendum)
PROGRESS NOTE    Natasha Pope  ZOX:[id number] DOB: 12/15/1995 DOA: 01/10/2019 PCP: Orland MustardWolfe, Allison, MD     Brief Narrative:  Natasha Pope is a 23 y.o. female with medical history significant of previous psych hospitalizations, substance abuse, alcohol abuse who is brought in to the hospital by her grandmother due to nausea, vomiting, alcohol intoxication, suicidal ideation.  She has had 6 ED visits and 2 inpatient admissions in the past 6 months related to her alcohol abuse.  She admits to some nausea and vomiting, has not been able to take anything p.o. since yesterday.  States that she hurts all over, but no specific complaints.  Denies any fevers or shortness of breath. She was evaluated by psychiatry, they recommend inpatient psych hospitalization.  Due to her continued nausea, vomiting, EDP referred patient for admission for stabilization prior to transfer to inpatient psych placement.  New events last 24 hours / Subjective: Sitter at bedside.  Complained of nausea this morning.  Did have an episode of vomiting about 50 cc per nursing.  Asking for ginger ale.  Assessment & Plan:   Principal Problem:   Alcohol withdrawal (HCC) Active Problems:   Tobacco abuse   Alcohol abuse   Alcohol use with alcohol-induced mood disorder (HCC)   Seizure (HCC)   Alcohol intoxication and withdrawal -Due to nausea and vomiting in the ED, she may require IV Ativan -CIWA protocol   Hx of alcohol withdrawal seizures -Continue dilantin  -Has been referred to neurology by PCP  Suicidal ideation -Evaluated by psych in the ED, recommend inpatient psych hospitalization. Currently voluntary but if patient wants to leave, will need IVC for safety  -Sitter at bedside and suicide precautions  -Continue prozac   QTc prolongation -Avoid QTc prolonging medication   Hypokalemia -Replace, trend -Magnesium 1.8    DVT prophylaxis: SCD Code Status: Full code  Family  Communication: None Disposition Plan: Pending improvement, transfer to behavioral health Hospital once medically stable.  Change status to inpatient.  Not tolerating adequate p.o. yet, continue IV fluids.   Consultants:   Psych evaluated patient in the emergency department  Procedures:   None  Antimicrobials:  Anti-infectives (From admission, onward)   None        Objective: Vitals:   01/10/19 1712 01/10/19 1928 01/10/19 2109 01/11/19 0449  BP: (!) 159/116 (!) 145/111 (!) 142/90 (!) 149/110  Pulse: 86 86 84 80  Resp: 20 18 20 18   Temp: 99 F (37.2 C) 99.1 F (37.3 C) 99.4 F (37.4 C) 98.9 F (37.2 C)  TempSrc: Oral Oral Oral Oral  SpO2: 97% 98% 99% 99%  Weight:   71.1 kg   Height:   5\' 6"  (1.676 m)     Intake/Output Summary (Last 24 hours) at 01/11/2019 1125 Last data filed at 01/11/2019 0830 Gross per 24 hour  Intake 1840.61 ml  Output 250 ml  Net 1590.61 ml   Filed Weights   01/10/19 0942 01/10/19 2109  Weight: 65.8 kg 71.1 kg    Examination:  General exam: Appears calm and comfortable  Respiratory system: Clear to auscultation. Respiratory effort normal. Cardiovascular system: S1 & S2 heard, RRR. No JVD, murmurs, rubs, gallops or clicks. No pedal edema. Gastrointestinal system: Abdomen is nondistended, soft and nontender. No organomegaly or masses felt. Normal bowel sounds heard. Central nervous system: Alert and oriented Extremities: Symmetric 5 x 5 power. Skin: No rashes, lesions or ulcers Psychiatry: Judgement and insight appear stable  Data Reviewed: I have personally reviewed  following labs and imaging studies  CBC: Recent Labs  Lab 01/10/19 1009 01/11/19 0534  WBC 4.2 3.3*  NEUTROABS 2.7  --   HGB 13.8 13.2  HCT 42.0 39.9  MCV 101.7* 103.4*  PLT 172 125*   Basic Metabolic Panel: Recent Labs  Lab 01/10/19 1009 01/11/19 0534  NA 136 135  K 3.5 3.1*  CL 98 101  CO2 20* 22  GLUCOSE 112* 100*  BUN 10 5*  CREATININE 0.64 0.56   CALCIUM 9.0 9.0  MG  --  1.8   GFR: Estimated Creatinine Clearance: 103.3 mL/min (by C-G formula based on SCr of 0.56 mg/dL). Liver Function Tests: Recent Labs  Lab 01/10/19 1009  AST 238*  ALT 95*  ALKPHOS 144*  BILITOT 0.6  PROT 7.9  ALBUMIN 4.7   Recent Labs  Lab 01/10/19 1009  LIPASE 28   No results for input(s): AMMONIA in the last 168 hours. Coagulation Profile: No results for input(s): INR, PROTIME in the last 168 hours. Cardiac Enzymes: No results for input(s): CKTOTAL, CKMB, CKMBINDEX, TROPONINI in the last 168 hours. BNP (last 3 results) No results for input(s): PROBNP in the last 8760 hours. HbA1C: No results for input(s): HGBA1C in the last 72 hours. CBG: No results for input(s): GLUCAP in the last 168 hours. Lipid Profile: No results for input(s): CHOL, HDL, LDLCALC, TRIG, CHOLHDL, LDLDIRECT in the last 72 hours. Thyroid Function Tests: No results for input(s): TSH, T4TOTAL, FREET4, T3FREE, THYROIDAB in the last 72 hours. Anemia Panel: No results for input(s): VITAMINB12, FOLATE, FERRITIN, TIBC, IRON, RETICCTPCT in the last 72 hours. Sepsis Labs: No results for input(s): PROCALCITON, LATICACIDVEN in the last 168 hours.  Recent Results (from the past 240 hour(s))  SARS Coronavirus 2 (CEPHEID - [facility]), Hosp Order     Status: None   Collection Time: 01/10/19  6:20 PM   Specimen: Nasopharyngeal Swab  Result Value Ref Range Status   SARS Coronavirus 2 NEGATIVE NEGATIVE Final    Comment: (NOTE) If result is NEGATIVE SARS-CoV-2 target nucleic acids are NOT DETECTED. The SARS-CoV-2 RNA is generally detectable in upper and lower  respiratory specimens during the acute phase of infection. The lowest  concentration of SARS-CoV-2 viral copies this assay can detect is 250  copies / mL. A negative result does not preclude SARS-CoV-2 infection  and should not be used as the sole basis for treatment or other  patient management  decisions.  A negative result may occur with  improper specimen collection / handling, submission of specimen other  than nasopharyngeal swab, presence of viral mutation(s) within the  areas targeted by this assay, and inadequate number of viral copies  (<250 copies / mL). A negative result must be combined with clinical  observations, patient history, and epidemiological information. If result is POSITIVE SARS-CoV-2 target nucleic acids are DETECTED. The SARS-CoV-2 RNA is generally detectable in upper and lower  respiratory specimens dur ing the acute phase of infection.  Positive  results are indicative of active infection with SARS-CoV-2.  Clinical  correlation with patient history and other diagnostic information is  necessary to determine patient infection status.  Positive results do  not rule out bacterial infection or co-infection with other viruses. If result is PRESUMPTIVE POSTIVE SARS-CoV-2 nucleic acids MAY BE PRESENT.   A presumptive positive result was obtained on the submitted specimen  and confirmed on repeat testing.  While 2019 novel coronavirus  (SARS-CoV-2) nucleic acids may be present in the  submitted sample  additional confirmatory testing may be necessary for epidemiological  and / or clinical management purposes  to differentiate between  SARS-CoV-2 and other Sarbecovirus currently known to infect humans.  If clinically indicated additional testing with an alternate test  methodology [id number]) is advised. The SARS-CoV-2 RNA is generally  detectable in upper and lower respiratory sp ecimens during the acute  phase of infection. The expected result is Negative. Fact Sheet for Patients:  StrictlyIdeas.no Fact Sheet for Healthcare Providers: BankingDealers.co.za This test is not yet approved or cleared by the [place] FDA and has been authorized for detection and/or diagnosis of SARS-CoV-2 by FDA under an  Emergency Use Authorization (EUA).  This EUA will remain in effect (meaning this test can be used) for the duration of the COVID-19 declaration under Section 564(b)(1) of the Act, 21 U.S.C. section 360bbb-3(b)(1), unless the authorization is terminated or revoked sooner. Performed at [facility], [place] [address], [place], [place] [address]        Radiology Studies: No results found.    Scheduled Meds: . FLUoxetine  40 mg Oral Daily  . folic acid  1 mg Oral Daily  . multivitamin with minerals  1 tablet Oral Daily  . norethindrone-ethinyl estradiol  1 tablet Oral Daily  . phenytoin  100 mg Oral BID  . potassium chloride  40 mEq Oral Q4H  . sodium chloride flush  3 mL Intravenous Q12H  . thiamine  100 mg Oral Daily   Or  . thiamine  100 mg Intravenous Daily   Continuous Infusions: . sodium chloride 75 mL/hr at 01/10/19 2014     LOS: 0 days    Time spent: 25 minutes   Dessa Phi, DO [facility] [email or web address] 01/11/2019, 11:25 AM

## 2019-01-11 NOTE — Progress Notes (Signed)
NT reported that patient had verbalized the desire to leave the hospital. MD was notified and she spoke with patient who verbalized that she would stay. MD instructed that if patient tries to leave Security is to be called and MD notified to begin the IVC process. Will continue to monitor.

## 2019-01-12 DIAGNOSIS — R569 Unspecified convulsions: Secondary | ICD-10-CM

## 2019-01-12 DIAGNOSIS — F10232 Alcohol dependence with withdrawal with perceptual disturbance: Secondary | ICD-10-CM

## 2019-01-12 LAB — BASIC METABOLIC PANEL
Anion gap: 11 (ref 5–15)
BUN: <5 mg/dL — ABNORMAL LOW (ref 6–20)
CO2: 19 mmol/L — ABNORMAL LOW (ref 22–32)
Calcium: 9.2 mg/dL (ref 8.9–10.3)
Chloride: 105 mmol/L (ref 98–111)
Creatinine, Ser: 0.55 mg/dL (ref 0.44–1.00)
GFR calc Af Amer: >60 mL/min (ref >60)
GFR calc non Af Amer: >60 mL/min (ref >60)
Glucose, Bld: 104 mg/dL — ABNORMAL HIGH (ref 70–99)
Potassium: 3.5 mmol/L (ref 3.5–5.1)
Sodium: 135 mmol/L (ref 135–145)

## 2019-01-12 LAB — MRSA PCR SCREENING: MRSA by PCR: NEGATIVE

## 2019-01-12 MED ORDER — LORAZEPAM 2 MG/ML IJ SOLN
INTRAMUSCULAR | Status: AC
  Filled 2019-01-12: qty 1

## 2019-01-12 MED ORDER — HALOPERIDOL LACTATE 5 MG/ML IJ SOLN
INTRAMUSCULAR | Status: AC
  Administered 2019-01-12: 09:00:00 5 mg
  Filled 2019-01-12: qty 1

## 2019-01-12 MED ORDER — LORAZEPAM 2 MG/ML IJ SOLN: 2.0000 mg | INTRAMUSCULAR | Status: DC | PRN

## 2019-01-12 MED ORDER — HALOPERIDOL LACTATE 5 MG/ML IJ SOLN: 5.0000 mg | Freq: Four times a day (QID) | INTRAMUSCULAR | Status: DC | PRN

## 2019-01-12 MED ORDER — LORAZEPAM 2 MG/ML IJ SOLN
1.0000 mg | Freq: Once | INTRAMUSCULAR | Status: AC
  Administered 2019-01-12: 03:00:00 1 mg via INTRAVENOUS
  Filled 2019-01-12: qty 1

## 2019-01-12 MED ORDER — CHLORHEXIDINE GLUCONATE CLOTH 2 % EX PADS
6.0000 | MEDICATED_PAD | Freq: Every day | CUTANEOUS | Status: DC
  Administered 2019-01-12 – 2019-01-15 (×4): 6 via TOPICAL

## 2019-01-12 MED ORDER — DIPHENHYDRAMINE HCL 50 MG/ML IJ SOLN
12.5000 mg | Freq: Once | INTRAMUSCULAR | Status: AC
  Administered 2019-01-12: 12.5 mg via INTRAVENOUS
  Filled 2019-01-12: qty 1

## 2019-01-12 MED ORDER — SODIUM CHLORIDE 0.9 % IV BOLUS
500.0000 mL | Freq: Once | INTRAVENOUS | Status: AC
  Administered 2019-01-12: 500 mL via INTRAVENOUS

## 2019-01-12 MED ORDER — OLANZAPINE 5 MG PO TABS
5.0000 mg | ORAL_TABLET | Freq: Once | ORAL | Status: AC
  Administered 2019-01-12: 5 mg via ORAL
  Filled 2019-01-12: qty 1

## 2019-01-12 MED ORDER — SODIUM CHLORIDE 0.9 % IV SOLN
INTRAVENOUS | Status: DC
  Administered 2019-01-12 – 2019-01-13 (×3): via INTRAVENOUS

## 2019-01-12 MED ORDER — LORAZEPAM 2 MG/ML IJ SOLN
1.0000 mg | Freq: Once | INTRAMUSCULAR | Status: AC
  Administered 2019-01-12: 04:00:00 1 mg via INTRAVENOUS

## 2019-01-12 MED ORDER — DIAZEPAM 5 MG/ML IJ SOLN
5.0000 mg | INTRAMUSCULAR | Status: DC | PRN
  Administered 2019-01-12: 10 mg via INTRAVENOUS
  Filled 2019-01-12: qty 2

## 2019-01-12 MED ORDER — LORAZEPAM 2 MG/ML IJ SOLN
2.0000 mg | Freq: Once | INTRAMUSCULAR | Status: AC
  Administered 2019-01-12: 2 mg via INTRAMUSCULAR
  Filled 2019-01-12: qty 1

## 2019-01-12 MED ORDER — DIPHENHYDRAMINE HCL 50 MG/ML IJ SOLN
25.0000 mg | Freq: Once | INTRAMUSCULAR | Status: AC
  Administered 2019-01-12: 25 mg via INTRAVENOUS
  Filled 2019-01-12: qty 1

## 2019-01-12 MED ORDER — DEXMEDETOMIDINE HCL IN NACL 200 MCG/50ML IV SOLN
0.2000 ug/kg/h | INTRAVENOUS | Status: DC
  Administered 2019-01-12: 1.2 ug/kg/h via INTRAVENOUS
  Administered 2019-01-12: 15:00:00 0.8 ug/kg/h via INTRAVENOUS
  Administered 2019-01-12: 1.2 ug/kg/h via INTRAVENOUS
  Administered 2019-01-12: 0.2 ug/kg/h via INTRAVENOUS
  Administered 2019-01-12 (×2): 1.2 ug/kg/h via INTRAVENOUS
  Administered 2019-01-13: 0.8 ug/kg/h via INTRAVENOUS
  Administered 2019-01-13: 0.9 ug/kg/h via INTRAVENOUS
  Administered 2019-01-13: 1 ug/kg/h via INTRAVENOUS
  Filled 2019-01-12 (×10): qty 50

## 2019-01-12 NOTE — Consult Note (Signed)
NAME:  Grey Schlauch, MRN:  [id number], DOB:  1996/07/15, LOS: 1 ADMISSION DATE:  01/10/2019, CONSULTATION DATE:  01/12/2019 REFERRING MD:  Dr Maylene Roes, CHIEF COMPLAINT: Alcohol withdrawal  Brief History   Admitted with alcohol intoxication and suicidal ideation History of substance abuse, alcohol abuse Seizure disorder 6 ED visits and 2 inpatient admissions in the past 6 months   History of present illness    She was evaluated by psychiatry, they recommend inpatient psych hospitalization.  Due to her continued nausea, vomiting, EDP referred patient for admission for stabilization prior to transfer to inpatient psych placement.  Past Medical History   Past Medical History:  Diagnosis Date  . Anxiety   . Asthma   . Depression   . Pyelonephritis   . Sepsis ([place])   History of seizures  Significant Hospital Events   Very agitated  Consults:  Pccm 01/12/2019  Procedures:  None  Significant Diagnostic Tests:    Interim history/subjective:  Patient remains very agitated, combative  Objective   Blood pressure (!) 147/108, pulse (!) 244, temperature 98.6 F (37 C), temperature source Oral, resp. rate (!) 26, height 5\' 6"  (1.676 m), weight 71.1 kg, SpO2 100 %.        Intake/Output Summary (Last 24 hours) at 01/12/2019 1030 Last data filed at 01/11/2019 1745 Gross per 24 hour  Intake 480 ml  Output -  Net 480 ml   Filed Weights   01/10/19 0942 01/10/19 2109  Weight: 65.8 kg 71.1 kg    Examination: General: Young lady, does not appear to be in respiratory distress HENT: Moist oral mucosa Lungs: Clear breath sounds Cardiovascular: S1-S2 appreciated Abdomen: Bowel sounds appreciated Extremities: No clubbing, no edema Neuro: Alert and oriented, very agitated GU:   Resolved Hospital Problem list     Assessment & Plan:   Alcohol withdrawal -Alcohol withdrawal protocol -Remains very agitated -We will start on Precedex  History of alcohol withdrawal  seizures -Continue anti-seizure medications  Suicidal ideation -Sitter remains at bedside  Continue antidepressant/anti-anxiety medications  Will require inpatient management Extensive counseling   Best practice:  Diet: Regular, as tolerated Pain/Anxiety/Delirium protocol (if indicated): Precedex VAP protocol (if indicated): Not applicable DVT prophylaxis: SCD Mobility: Bedrest Code Status: Full code Disposition:   Labs   CBC: Recent Labs  Lab 01/10/19 1009 01/11/19 0534  WBC 4.2 3.3*  NEUTROABS 2.7  --   HGB 13.8 13.2  HCT 42.0 39.9  MCV 101.7* 103.4*  PLT 172 125*    Basic Metabolic Panel: Recent Labs  Lab 01/10/19 1009 01/11/19 0534 01/12/19 0534  NA 136 135 135  K 3.5 3.1* 3.5  CL 98 101 105  CO2 20* 22 19*  GLUCOSE 112* 100* 104*  BUN 10 5* <5*  CREATININE 0.64 0.56 0.55  CALCIUM 9.0 9.0 9.2  MG  --  1.8  --    GFR: Estimated Creatinine Clearance: 103.3 mL/min (by C-G formula based on SCr of 0.55 mg/dL). Recent Labs  Lab 01/10/19 1009 01/11/19 0534  WBC 4.2 3.3*    Liver Function Tests: Recent Labs  Lab 01/10/19 1009  AST 238*  ALT 95*  ALKPHOS 144*  BILITOT 0.6  PROT 7.9  ALBUMIN 4.7   Recent Labs  Lab 01/10/19 1009  LIPASE 28   No results for input(s): AMMONIA in the last 168 hours.  ABG    Component Value Date/Time   TCO2 24 05/25/2018 1637     Coagulation Profile: No results for input(s): INR, PROTIME in  the last 168 hours.  Cardiac Enzymes: No results for input(s): CKTOTAL, CKMB, CKMBINDEX, TROPONINI in the last 168 hours.  HbA1C: No results found for: HGBA1C  CBG: No results for input(s): GLUCAP in the last 168 hours.  Review of Systems:   Very agitated Has no pain or discomfort  Past Medical History  She,  has a past medical history of Anxiety, Asthma, Depression, Pyelonephritis, and Sepsis (HCC).   Surgical History    Past Surgical History:  Procedure Laterality Date  . dental procedure        Social History   reports that she quit smoking about 8 months ago. Her smoking use included cigarettes. She smoked 0.50 packs per day. She has never used smokeless tobacco. She reports current alcohol use. She reports that she does not use drugs.   Family History   Her family history includes Alcohol abuse in her father, maternal grandfather, and paternal grandmother; Depression in her mother; Hyperlipidemia in her maternal grandmother; Hypertension in her father and paternal grandfather; Learning disabilities in her sister; Miscarriages / Stillbirths in her mother.   Allergies No Known Allergies   Virl DiamondAdewale Rhet Rorke, MD [place], PCCM Cell: [phone]

## 2019-01-12 NOTE — Progress Notes (Signed)
Patient restless and agitated. States her grandmother is here to get her and bring her home. Her sitter is ambulating in the hall with her. Patient states she's leaving. Dr. Maylene Roes called and informed , she wrote orders to transfer patient to step-down unit. Report called to unit and patient transferred via wheelchair. Her clothes were brought down to her by the charge nurse.

## 2019-01-12 NOTE — Progress Notes (Signed)
Patient transferred to unit in wheelchair with 1:1 safety sitter and with no IV access. Upon arrival she was cooperative but within 5 minutes became agitated, combative and verably aggressive trying to leave. Dr. Maylene Roes arrived at bedside and gave orders for medication (See MAR). Upon multiple attempts to redirect patient and calm her she continued to be aggressive and security was called. Restraints were applied at 1000, pt continues to try and bite staff and bite the restraints off. Patient now has IVC papers in her chart. CCM is consulting and pt was placed on Precedex gtt. 1:1 sitter remains at bedside, will continue efforts to therapeutically calm patient.

## 2019-01-12 NOTE — Progress Notes (Signed)
Patient continues to be very combative, verbally aggressive, and agitated. Hallucinating that staff is her family and that she ias at a tattoo salon. Attempted to deescalate with verbal affirmations and therapeutic touch when unable, MD paged and restraints applied, assisted by security to calm patient down with no success. Patient biting at staff. PCCM consulted and Precedex started.

## 2019-01-12 NOTE — Progress Notes (Signed)
Pt ETOH w/d symptoms worsening, CIWA elevated 26. RRN notified and recommended additional dose of ativan. Choi paged.

## 2019-01-12 NOTE — Progress Notes (Signed)
Upon admission to unit patient states her mom is outside to pick her up. Her mother is actually out of state. Patient agrees to CHG bath and vitals but is insisting she has to leave after that. After vitals patient gets up pushes staff out of the way, security called and patient stripped naked and refused to cooperate with staff. Patient deescalated and patients family called. Per patients grandmother and father patient has an adverse reaction to all benzo's including Ativan as they cause hallucinations and delusions. Awaiting IV team for IV placement as patient continues to insist she will leave as soon as she gets an IV. Haldol ready and available for administration after IV is placed. IVC paperwork in progress with case management and social work. Patient currently calm in bed but continues to hallucinate that her grandmother is in her room.

## 2019-01-12 NOTE — Progress Notes (Signed)
PROGRESS NOTE    Natasha Pope  ZOX:[id number] DOB: 05/19/1996 DOA: 01/10/2019 PCP: Orland MustardWolfe, Allison, MD     Brief Narrative:  Natasha Pope is a 23 y.o. female with medical history significant of previous psych hospitalizations, substance abuse, alcohol abuse who is brought in to the hospital by her grandmother due to nausea, vomiting, alcohol intoxication, suicidal ideation.  She has had 6 ED visits and 2 inpatient admissions in the past 6 months related to her alcohol abuse.  She admits to some nausea and vomiting, has not been able to take anything p.o. since yesterday.  States that she hurts all over, but no specific complaints.  Denies any fevers or shortness of breath. She was evaluated by psychiatry, they recommend inpatient psych hospitalization.  Due to her continued nausea, vomiting, EDP referred patient for admission for stabilization prior to transfer to inpatient psych placement.  New events last 24 hours / Subjective: Early this morning, called by RN patient hallucinating. She received IV ativan overnight without improvement. She was transferred to stepdown this morning.   Patient very agitated on exam, RNs and sitter at bedside. She has lost IV access. She tells me that her grandmother is in the parking lot to pick her up and that she will leave the hospital. She became very agitated, got out of bed. Security called. I updated grandmother on our plan to IVC patient for safety.   Per grandmother, patient historically does worse when on ativan, becomes "mean."   Assessment & Plan:   Principal Problem:   Alcohol withdrawal (HCC) Active Problems:   Tobacco abuse   Alcohol abuse   Alcohol use with alcohol-induced mood disorder (HCC)   Seizure (HCC)   Alcohol intoxication and withdrawal -CIWA protocol, will change to valium instead of ativan   Hx of alcohol withdrawal seizures -Continue dilantin  -Has been referred to neurology by PCP  Suicidal ideation  -Evaluated by psych in the ED, recommend inpatient psych hospitalization -Sitter at bedside and suicide precautions  -Continue prozac  -IVC paperwork filled ou t today   QTc prolongation -Avoid QTc prolonging medication. Had to give haldol IM due to IV access loss. Monitor QTc on telemetry    DVT prophylaxis: SCD Code Status: Full code  Family Communication: Grandmother over the phone  Disposition Plan: Pending improvement, transfer to behavioral health Hospital once medically stable.    Consultants:   Psych evaluated patient in the emergency department  Procedures:   None  Antimicrobials:  Anti-infectives (From admission, onward)   None       Objective: Vitals:   01/11/19 2206 01/12/19 0014 01/12/19 0657 01/12/19 0803  BP: (!) 143/105 (!) 152/116 (!) 145/111 (!) 138/105  Pulse: 95 (!) 101 (!) 115 (!) 131  Resp:  20  16  Temp:  99.8 F (37.7 C) 99.5 F (37.5 C) 98.6 F (37 C)  TempSrc:  Oral  Oral  SpO2:  100% 100% 100%  Weight:      Height:        Intake/Output Summary (Last 24 hours) at 01/12/2019 0842 Last data filed at 01/11/2019 1745 Gross per 24 hour  Intake 480 ml  Output -  Net 480 ml   Filed Weights   01/10/19 0942 01/10/19 2109  Weight: 65.8 kg 71.1 kg    Examination: General exam: Appears agitated Respiratory system: Clear to auscultation. Respiratory effort normal. Cardiovascular system: S1 & S2 heard, tachycardic, regular rhythm Gastrointestinal system: Abdomen is nondistended, soft and nontender Central nervous system:  Alert, agitated  Extremities: Symmetric  Skin: No rashes, lesions or ulcers Psychiatry: Judgement and insight appear poor, hallucinating    Data Reviewed: I have personally reviewed following labs and imaging studies  CBC: Recent Labs  Lab 01/10/19 1009 01/11/19 0534  WBC 4.2 3.3*  NEUTROABS 2.7  --   HGB 13.8 13.2  HCT 42.0 39.9  MCV 101.7* 103.4*  PLT 172 413*   Basic Metabolic Panel: Recent Labs  Lab  01/10/19 1009 01/11/19 0534 01/12/19 0534  NA 136 135 135  K 3.5 3.1* 3.5  CL 98 101 105  CO2 20* 22 19*  GLUCOSE 112* 100* 104*  BUN 10 5* <5*  CREATININE 0.64 0.56 0.55  CALCIUM 9.0 9.0 9.2  MG  --  1.8  --    GFR: Estimated Creatinine Clearance: 103.3 mL/min (by C-G formula based on SCr of 0.55 mg/dL). Liver Function Tests: Recent Labs  Lab 01/10/19 1009  AST 238*  ALT 95*  ALKPHOS 144*  BILITOT 0.6  PROT 7.9  ALBUMIN 4.7   Recent Labs  Lab 01/10/19 1009  LIPASE 28   No results for input(s): AMMONIA in the last 168 hours. Coagulation Profile: No results for input(s): INR, PROTIME in the last 168 hours. Cardiac Enzymes: No results for input(s): CKTOTAL, CKMB, CKMBINDEX, TROPONINI in the last 168 hours. BNP (last 3 results) No results for input(s): PROBNP in the last 8760 hours. HbA1C: No results for input(s): HGBA1C in the last 72 hours. CBG: No results for input(s): GLUCAP in the last 168 hours. Lipid Profile: No results for input(s): CHOL, HDL, LDLCALC, TRIG, CHOLHDL, LDLDIRECT in the last 72 hours. Thyroid Function Tests: No results for input(s): TSH, T4TOTAL, FREET4, T3FREE, THYROIDAB in the last 72 hours. Anemia Panel: No results for input(s): VITAMINB12, FOLATE, FERRITIN, TIBC, IRON, RETICCTPCT in the last 72 hours. Sepsis Labs: No results for input(s): PROCALCITON, LATICACIDVEN in the last 168 hours.  Recent Results (from the past 240 hour(s))  SARS Coronavirus 2 (CEPHEID - Performed in [place] hospital lab), Hosp Order     Status: None   Collection Time: 01/10/19  6:20 PM   Specimen: Nasopharyngeal Swab  Result Value Ref Range Status   SARS Coronavirus 2 NEGATIVE NEGATIVE Final    Comment: (NOTE) If result is NEGATIVE SARS-CoV-2 target nucleic acids are NOT DETECTED. The SARS-CoV-2 RNA is generally detectable in upper and lower  respiratory specimens during the acute phase of infection. The lowest  concentration of SARS-CoV-2 viral copies  this assay can detect is 250  copies / mL. A negative result does not preclude SARS-CoV-2 infection  and should not be used as the sole basis for treatment or other  patient management decisions.  A negative result may occur with  improper specimen collection / handling, submission of specimen other  than nasopharyngeal swab, presence of viral mutation(s) within the  areas targeted by this assay, and inadequate number of viral copies  (<250 copies / mL). A negative result must be combined with clinical  observations, patient history, and epidemiological information. If result is POSITIVE SARS-CoV-2 target nucleic acids are DETECTED. The SARS-CoV-2 RNA is generally detectable in upper and lower  respiratory specimens dur ing the acute phase of infection.  Positive  results are indicative of active infection with SARS-CoV-2.  Clinical  correlation with patient history and other diagnostic information is  necessary to determine patient infection status.  Positive results do  not rule out bacterial infection or co-infection with other viruses. If result  is PRESUMPTIVE POSTIVE SARS-CoV-2 nucleic acids MAY BE PRESENT.   A presumptive positive result was obtained on the submitted specimen  and confirmed on repeat testing.  While 2019 novel coronavirus  (SARS-CoV-2) nucleic acids may be present in the submitted sample  additional confirmatory testing may be necessary for epidemiological  and / or clinical management purposes  to differentiate between  SARS-CoV-2 and other Sarbecovirus currently known to infect humans.  If clinically indicated additional testing with an alternate test  methodology [phone]([id number]) is advised. The SARS-CoV-2 RNA is generally  detectable in upper and lower respiratory sp ecimens during the acute  phase of infection. The expected result is Negative. Fact Sheet for Patients:  BoilerBrush.com.cyhttps://www.fda.gov/media/136312/download Fact Sheet for Healthcare Providers:  https://pope.com/https://www.fda.gov/media/136313/download This test is not yet approved or cleared by the [place] FDA and has been authorized for detection and/or diagnosis of SARS-CoV-2 by FDA under an Emergency Use Authorization (EUA).  This EUA will remain in effect (meaning this test can be used) for the duration of the COVID-19 declaration under Section 564(b)(1) of the Act, 21 U.S.C. section 360bbb-3(b)(1), unless the authorization is terminated or revoked sooner. Performed at [facility], [address] [place], [place] [address]        Radiology Studies: No results found.    Scheduled Meds: . haloperidol lactate      . diphenhydrAMINE  12.5 mg Intravenous Once  . FLUoxetine  40 mg Oral Daily  . folic acid  1 mg Oral Daily  . multivitamin with minerals  1 tablet Oral Daily  . norethindrone-ethinyl estradiol  1 tablet Oral Daily  . phenytoin  100 mg Oral BID  . sodium chloride flush  3 mL Intravenous Q12H  . thiamine  100 mg Oral Daily   Or  . thiamine  100 mg Intravenous Daily   Continuous Infusions:    LOS: 1 day    Time spent: 25 minutes   Noralee StainJennifer Kayti Poss, DO [facility] [email or web address] 01/12/2019, 8:42 AM

## 2019-01-13 MED ORDER — DIAZEPAM 2 MG PO TABS
2.0000 mg | ORAL_TABLET | ORAL | Status: AC | PRN
  Administered 2019-01-13 – 2019-01-15 (×8): 2 mg via ORAL
  Filled 2019-01-13 (×5): qty 1

## 2019-01-13 MED ORDER — DIAZEPAM 5 MG PO TABS
5.0000 mg | ORAL_TABLET | Freq: Three times a day (TID) | ORAL | Status: DC
  Administered 2019-01-13 – 2019-01-16 (×10): 5 mg via ORAL
  Filled 2019-01-13 (×13): qty 1

## 2019-01-13 MED ORDER — DIAZEPAM 5 MG/ML IJ SOLN: 5.0000 mg | Freq: Three times a day (TID) | INTRAMUSCULAR | Status: DC

## 2019-01-13 NOTE — Progress Notes (Signed)
PROGRESS NOTE    Natasha Pope [place]  PTW:[id number] DOB: 1996/03/06 DOA: 01/10/2019 PCP: Orma Flaming, MD     Brief Narrative:  Natasha Pope is a 23 y.o. female with medical history significant of previous psych hospitalizations, substance abuse, alcohol abuse who is brought in to the hospital by her grandmother due to nausea, vomiting, alcohol intoxication, suicidal ideation.  She has had 6 ED visits and 2 inpatient admissions in the past 6 months related to her alcohol abuse.  She admits to some nausea and vomiting, has not been able to take anything p.o. since yesterday.  States that she hurts all over, but no specific complaints.  Denies any fevers or shortness of breath. She was evaluated by psychiatry, they recommend inpatient psych hospitalization.  Due to her continued nausea, vomiting, EDP referred patient for admission for stabilization prior to transfer to inpatient psych placement.  New events last 24 hours / Subjective: Sleeping comfortably.  No acute issues overnight.  Remains on Precedex drip  Assessment & Plan:   Principal Problem:   Alcohol withdrawal ([place]) Active Problems:   Tobacco abuse   Alcohol abuse   Alcohol use with alcohol-induced mood disorder (HCC)   Seizure ([place])   Alcohol intoxication and withdrawal -CIWA protocol on Precedex drip, appreciate PCCM  Hx of alcohol withdrawal seizures -Continue dilantin  -Has been referred to neurology by PCP  Suicidal ideation -Evaluated by psych in the ED, recommend inpatient psych hospitalization -Sitter at bedside and suicide precautions  -Continue prozac  -IVC paperwork filed   QTc prolongation -Avoid QTc prolonging medication. Monitor QTc on telemetry    DVT prophylaxis: SCD Code Status: Full code  Family Communication: None Disposition Plan: Pending improvement, transfer to behavioral health Hospital once medically stable.    Consultants:   Psych evaluated patient in the emergency  department  Procedures:   None  Antimicrobials:  Anti-infectives (From admission, onward)   None       Objective: Vitals:   01/13/19 0700 01/13/19 0800 01/13/19 0900 01/13/19 1000  BP: (!) 156/102 (!) 151/113 (!) 152/122 (!) 136/97  Pulse: 74 73 77 70  Resp: 17 19 19 16   Temp:      TempSrc:      SpO2: 99% 97% 100% 99%  Weight:      Height:        Intake/Output Summary (Last 24 hours) at 01/13/2019 1014 Last data filed at 01/13/2019 1002 Gross per 24 hour  Intake 1321.87 ml  Output 1700 ml  Net -378.13 ml   Filed Weights   01/10/19 0942 01/10/19 2109  Weight: 65.8 kg 71.1 kg    Examination: General exam: Appears calm and comfortable, sleeping Respiratory system: Clear to auscultation. Respiratory effort normal. Cardiovascular system: S1 & S2 heard, RRR. No JVD, murmurs, rubs, gallops or clicks. No pedal edema. Gastrointestinal system: Abdomen is nondistended, soft and nontender. No organomegaly or masses felt. Normal bowel sounds heard. Extremities: Symmetric  Skin: No rashes, lesions or ulcers    Data Reviewed: I have personally reviewed following labs and imaging studies  CBC: Recent Labs  Lab 01/10/19 1009 01/11/19 0534  WBC 4.2 3.3*  NEUTROABS 2.7  --   HGB 13.8 13.2  HCT 42.0 39.9  MCV 101.7* 103.4*  PLT 172 700*   Basic Metabolic Panel: Recent Labs  Lab 01/10/19 1009 01/11/19 0534 01/12/19 0534  NA 136 135 135  K 3.5 3.1* 3.5  CL 98 101 105  CO2 20* 22 19*  GLUCOSE 112* 100*  104*  BUN 10 5* <5*  CREATININE 0.64 0.56 0.55  CALCIUM 9.0 9.0 9.2  MG  --  1.8  --    GFR: Estimated Creatinine Clearance: 103.3 mL/min (by C-G formula based on SCr of 0.55 mg/dL). Liver Function Tests: Recent Labs  Lab 01/10/19 1009  AST 238*  ALT 95*  ALKPHOS 144*  BILITOT 0.6  PROT 7.9  ALBUMIN 4.7   Recent Labs  Lab 01/10/19 1009  LIPASE 28   No results for input(s): AMMONIA in the last 168 hours. Coagulation Profile: No results for  input(s): INR, PROTIME in the last 168 hours. Cardiac Enzymes: No results for input(s): CKTOTAL, CKMB, CKMBINDEX, TROPONINI in the last 168 hours. BNP (last 3 results) No results for input(s): PROBNP in the last 8760 hours. HbA1C: No results for input(s): HGBA1C in the last 72 hours. CBG: No results for input(s): GLUCAP in the last 168 hours. Lipid Profile: No results for input(s): CHOL, HDL, LDLCALC, TRIG, CHOLHDL, LDLDIRECT in the last 72 hours. Thyroid Function Tests: No results for input(s): TSH, T4TOTAL, FREET4, T3FREE, THYROIDAB in the last 72 hours. Anemia Panel: No results for input(s): VITAMINB12, FOLATE, FERRITIN, TIBC, IRON, RETICCTPCT in the last 72 hours. Sepsis Labs: No results for input(s): PROCALCITON, LATICACIDVEN in the last 168 hours.  Recent Results (from the past 240 hour(s))  SARS Coronavirus 2 (CEPHEID - Performed in [facility] hospital lab), Hosp Order     Status: None   Collection Time: 01/10/19  6:20 PM   Specimen: Nasopharyngeal Swab  Result Value Ref Range Status   SARS Coronavirus 2 NEGATIVE NEGATIVE Final    Comment: (NOTE) If result is NEGATIVE SARS-CoV-2 target nucleic acids are NOT DETECTED. The SARS-CoV-2 RNA is generally detectable in upper and lower  respiratory specimens during the acute phase of infection. The lowest  concentration of SARS-CoV-2 viral copies this assay can detect is 250  copies / mL. A negative result does not preclude SARS-CoV-2 infection  and should not be used as the sole basis for treatment or other  patient management decisions.  A negative result may occur with  improper specimen collection / handling, submission of specimen other  than nasopharyngeal swab, presence of viral mutation(s) within the  areas targeted by this assay, and inadequate number of viral copies  (<250 copies / mL). A negative result must be combined with clinical  observations, patient history, and epidemiological information. If result is  POSITIVE SARS-CoV-2 target nucleic acids are DETECTED. The SARS-CoV-2 RNA is generally detectable in upper and lower  respiratory specimens dur ing the acute phase of infection.  Positive  results are indicative of active infection with SARS-CoV-2.  Clinical  correlation with patient history and other diagnostic information is  necessary to determine patient infection status.  Positive results do  not rule out bacterial infection or co-infection with other viruses. If result is PRESUMPTIVE POSTIVE SARS-CoV-2 nucleic acids MAY BE PRESENT.   A presumptive positive result was obtained on the submitted specimen  and confirmed on repeat testing.  While 2019 novel coronavirus  (SARS-CoV-2) nucleic acids may be present in the submitted sample  additional confirmatory testing may be necessary for epidemiological  and / or clinical management purposes  to differentiate between  SARS-CoV-2 and other Sarbecovirus currently known to infect humans.  If clinically indicated additional testing with an alternate test  methodology [phone]) is advised. The SARS-CoV-2 RNA is generally  detectable in upper and lower respiratory sp ecimens during the acute  phase of  infection. The expected result is Negative. Fact Sheet for Patients:  BoilerBrush.com.cyhttps://www.fda.gov/media/136312/download Fact Sheet for Healthcare Providers: https://pope.com/https://www.fda.gov/media/136313/download This test is not yet approved or cleared by the [place] FDA and has been authorized for detection and/or diagnosis of SARS-CoV-2 by FDA under an Emergency Use Authorization (EUA).  This EUA will remain in effect (meaning this test can be used) for the duration of the COVID-19 declaration under Section 564(b)(1) of the Act, 21 U.S.C. section 360bbb-3(b)(1), unless the authorization is terminated or revoked sooner. Performed at [facility], [address] [place], [place] [address]   MRSA PCR Screening     Status: None    Collection Time: 01/12/19  1:50 PM   Specimen: Nasopharyngeal  Result Value Ref Range Status   MRSA by PCR NEGATIVE NEGATIVE Final    Comment:        The GeneXpert MRSA Assay (FDA approved for NASAL specimens only), is one component of a comprehensive MRSA colonization surveillance program. It is not intended to diagnose MRSA infection nor to guide or monitor treatment for MRSA infections. Performed at [facility], [address] [place], [place] [address]        Radiology Studies: No results found.    Scheduled Meds: . Chlorhexidine Gluconate Cloth  6 each Topical Daily  . diazepam  5 mg Oral Q8H  . FLUoxetine  40 mg Oral Daily  . folic acid  1 mg Oral Daily  . LORazepam      . multivitamin with minerals  1 tablet Oral Daily  . norethindrone-ethinyl estradiol  1 tablet Oral Daily  . phenytoin  100 mg Oral BID  . sodium chloride flush  3 mL Intravenous Q12H  . thiamine  100 mg Oral Daily   Or  . thiamine  100 mg Intravenous Daily   Continuous Infusions: . dexmedetomidine (PRECEDEX) IV infusion 0.5 mcg/kg/hr (01/13/19 1004)     LOS: 2 days    Time spent: 25 minutes   Noralee StainJennifer Adara Kittle, DO [facility] [email or web address] 01/13/2019, 10:14 AM

## 2019-01-13 NOTE — Progress Notes (Signed)
NAME:  Natasha Pope, MRN:  [id number], DOB:  Nov 21, 1995, LOS: 2 ADMISSION DATE:  01/10/2019, CONSULTATION DATE:  01/12/2019 REFERRING MD:  Dr Maylene Roes, CHIEF COMPLAINT: Alcohol withdrawal  Brief History   23 yo female admitted 6/20 with alcohol intoxication and suicidal ideation with recommendations for inpatient psych and medical stabilization per psych. Hospital course complicated by alcohol w/d and agitation requiring transfer to ICU for precedex drip on 6/22.  6 ED visits and 2 inpatient admissions in the past 6 months   Past Medical History  Anxiety Asthma  Depression Pyelonephritis Seizures  Significant Hospital Events   6/20: Admit 6/22: Rapid Response for agitation and tx to ICU for Precedex drip  Consults:  PCCM 01/12/2019  Procedures:  None  Significant Diagnostic Tests:    Interim history/subjective:  Precedex at 0.8 mcg/kg/min Arouses to voice, oriented and cooperative, no focal deficits Denies any complaints of pain, nausea or vomiting  Objective   Blood pressure (!) 151/113, pulse 73, temperature 98 F (36.7 C), temperature source Axillary, resp. rate 19, height 5\' 6"  (1.676 m), weight 71.1 kg, SpO2 97 %.        Intake/Output Summary (Last 24 hours) at 01/13/2019 0827 Last data filed at 01/13/2019 0600 Gross per 24 hour  Intake 961.87 ml  Output 1700 ml  Net -738.13 ml   Filed Weights   01/10/19 0942 01/10/19 2109  Weight: 65.8 kg 71.1 kg    Examination: General: Healthy appearing young lady HENT: Moist oral mucosa Lungs: Clear breath sounds, symmetric expansion Cardiovascular: S1-S2, SR Abdomen: + Bowel sounds, NT, ND Extremities: Warm and well perfused Neuro: Alert and oriented, Cooperative   Resolved Hospital Problem list     Assessment & Plan:   Alcohol withdrawal Alcohol Abuse -Allergy to ativan P -Alcohol withdrawal protocol: Precedex 0.8 mcg/kg/min.  -Appears volume resuscitated and tolerating adequate PO intake this  am, discontinue IVF -Empiric Thiamine -Wean Precedex, will start PO valium to assist with weaning of precedex.  History of alcohol withdrawal seizures P: -Continue home Dilantin -No evidence of seizure activity this admission  Suicidal ideation History of Depression and Anxiety -Evaluated by Psych 6/20, recommended inpatient psych P: -Continue home Prozac -Sitter remains at bedside, denies suicidal ideation this am  Elevated LFT's -LFTs elevated on admission likely due to acute intoxication P: -Repeat LFT's today, if not trending down will add hepatitis panel and ultrasound.     Best practice:  Diet: Regular, as tolerated Pain/Anxiety/Delirium protocol (if indicated): Precedex VAP protocol (if indicated): Not applicable DVT prophylaxis: SCD Mobility: Mobilize as tolerated Code Status: Full code Disposition: ICU while on Precedex.   Labs   CBC: Recent Labs  Lab 01/10/19 1009 01/11/19 0534  WBC 4.2 3.3*  NEUTROABS 2.7  --   HGB 13.8 13.2  HCT 42.0 39.9  MCV 101.7* 103.4*  PLT 172 125*    Basic Metabolic Panel: Recent Labs  Lab 01/10/19 1009 01/11/19 0534 01/12/19 0534  NA 136 135 135  K 3.5 3.1* 3.5  CL 98 101 105  CO2 20* 22 19*  GLUCOSE 112* 100* 104*  BUN 10 5* <5*  CREATININE 0.64 0.56 0.55  CALCIUM 9.0 9.0 9.2  MG  --  1.8  --    GFR: Estimated Creatinine Clearance: 103.3 mL/min (by C-G formula based on SCr of 0.55 mg/dL). Recent Labs  Lab 01/10/19 1009 01/11/19 0534  WBC 4.2 3.3*    Liver Function Tests: Recent Labs  Lab 01/10/19 1009  AST 238*  ALT 95*  ALKPHOS 144*  BILITOT 0.6  PROT 7.9  ALBUMIN 4.7   Recent Labs  Lab 01/10/19 1009  LIPASE 28   No results for input(s): AMMONIA in the last 168 hours.  ABG    Component Value Date/Time   TCO2 24 05/25/2018 1637     Coagulation Profile: No results for input(s): INR, PROTIME in the last 168 hours.  Cardiac Enzymes: No results for input(s): CKTOTAL, CKMB, CKMBINDEX,  TROPONINI in the last 168 hours.  HbA1C: No results found for: HGBA1C  CBG: No results for input(s): GLUCAP in the last 168 hours.

## 2019-01-13 NOTE — TOC Initial Note (Signed)
Transition of Care [facility]) - Initial/Assessment Note    Patient Details  Name: Natasha Pope MRN: [id number] Date of Birth: [birth year]  [facility]) CM/SW Contact:    Nila Nephew, LCSW Phone Number: [phone] 01/13/2019, 11:15 AM  Clinical Narrative:  Pt currently admitted for etoh withdrawal, was IVC'd 01/12/19 9:04 am- psychiatry recommending inpatient treatment needed. Will follow to assist with disposition                 Expected Discharge Plan: Psychiatric Hospital Barriers to Discharge: Continued Medical Work up   Patient Goals and CMS Choice        Expected Discharge Plan and Services Expected Discharge Plan: [facility] In-house Referral: Clinical Social Work                                            Prior Living Arrangements/Services   Lives with:: Self Patient language and need for interpreter reviewed:: No        Need for Family Participation in Patient Care: Yes (Comment)(grandmother) Care giver support system in place?: No (comment)(no caregiving needs)   Criminal Activity/Legal Involvement Pertinent to Current Situation/Hospitalization: No - Comment as needed  Activities of Daily Living Home Assistive Devices/Equipment: None ADL Screening (condition at time of admission) Patient's cognitive ability adequate to safely complete daily activities?: Yes Is the patient deaf or have difficulty hearing?: No Does the patient have difficulty seeing, even when wearing glasses/contacts?: No Does the patient have difficulty concentrating, remembering, or making decisions?: No Patient able to express need for assistance with ADLs?: Yes Does the patient have difficulty dressing or bathing?: No Independently performs ADLs?: Yes (appropriate for developmental age) Does the patient have difficulty walking or climbing stairs?: No Weakness of Legs: None Weakness of Arms/Hands: None  Permission Sought/Granted Permission  sought to share information with : Family Supports Permission granted to share information with : Yes, Verbal Permission Granted  Share Information with NAME: grandmother Neoma Laming [phone]           Emotional Assessment Appearance:: Appears stated age       Alcohol / Substance Use: Alcohol Use(currently in withdrawal) Psych Involvement: Yes (comment)(OP provider and in-house evaluation)  Admission diagnosis:  Anxiety [F41.9] Alcohol use with alcohol-induced mood disorder ([place]) [F10.94] Alcohol withdrawal syndrome with complication (HCC) [Y07.371] Non-intractable vomiting with nausea, unspecified vomiting type [R11.2] Patient Active Problem List   Diagnosis Date Noted  . Alcohol withdrawal ([place]) 01/10/2019  . Alcohol use disorder, severe, dependence ([place]) 12/27/2018  . Recurrent major depressive disorder ([place]) 12/27/2018  . Seizure ([place]) 12/26/2018  . Alcohol use with alcohol-induced mood disorder ([place])   . Substance induced mood disorder ([place]) 12/09/2018  . Alcohol abuse 12/09/2018  . Acute pyelonephritis 05/19/2018  . Hypokalemia 05/19/2018  . Hypocalcemia 05/19/2018  . Hypomagnesemia 05/19/2018  . Macrocytic anemia 05/19/2018  . Electrolyte disturbance 05/19/2018  . Thrombocytopenia ([place]) 05/19/2018  . Alcohol use 05/19/2018  . ASCUS of cervix with negative high risk HPV 03/07/2018  . Tobacco abuse 01/31/2018  . Asthma 01/31/2018  . GAD (generalized anxiety disorder) 01/31/2018   PCP:  Orma Flaming, MD Pharmacy:   [facility] # [address], [place] [address] [place] [address] Phone: [phone] Fax: [phone]     Social Determinants of Health (SDOH) Interventions    Readmission Risk Interventions Readmission  Risk Prevention Plan 01/12/2019  Transportation Screening Complete  Medication Review Oceanographer(RN Care Manager) Complete  PCP or Specialist appointment within 3-5 days of discharge Not Complete   PCP/Specialist Appt Not Complete comments not ready for d/c, will need inpatient psych  HRI or Home Care Consult Complete  SW Recovery Care/Counseling Consult Complete  Palliative Care Screening Not Applicable  Skilled Nursing Facility Not Applicable

## 2019-01-13 NOTE — Progress Notes (Signed)
[place] Progress Note Patient Name: Natasha Pope [place] DOB: May 23, 1996 MRN: [id number]   Date of Service  01/13/2019  HPI/Events of Note  Pt with break through anxiety and sub-optimal Rx of ETOH withdrawal  eICU Interventions  Will add prn 2 mg oral Valium dose Q 4 hours for break through anxiety/ withdrawal        Frederik Pear 01/13/2019, 8:54 PM

## 2019-01-14 ENCOUNTER — Other Ambulatory Visit: Payer: 59

## 2019-01-14 DIAGNOSIS — F1010 Alcohol abuse, uncomplicated: Secondary | ICD-10-CM

## 2019-01-14 DIAGNOSIS — F1094 Alcohol use, unspecified with alcohol-induced mood disorder: Secondary | ICD-10-CM

## 2019-01-14 DIAGNOSIS — Z720 Tobacco use: Secondary | ICD-10-CM

## 2019-01-14 LAB — CBC
HCT: 37.8 % (ref 36.0–46.0)
Hemoglobin: 12.3 g/dL (ref 12.0–15.0)
MCH: 34.2 pg — ABNORMAL HIGH (ref 26.0–34.0)
MCHC: 32.5 g/dL (ref 30.0–36.0)
MCV: 105 fL — ABNORMAL HIGH (ref 80.0–100.0)
Platelets: 110 10*3/uL — ABNORMAL LOW (ref 150–400)
RBC: 3.6 MIL/uL — ABNORMAL LOW (ref 3.87–5.11)
RDW: 14.8 % (ref 11.5–15.5)
WBC: 6.1 10*3/uL (ref 4.0–10.5)
nRBC: 0 % (ref 0.0–0.2)

## 2019-01-14 LAB — COMPREHENSIVE METABOLIC PANEL
ALT: 46 U/L — ABNORMAL HIGH (ref 0–44)
AST: 44 U/L — ABNORMAL HIGH (ref 15–41)
Albumin: 3.8 g/dL (ref 3.5–5.0)
Alkaline Phosphatase: 115 U/L (ref 38–126)
Anion gap: 12 (ref 5–15)
BUN: 6 mg/dL (ref 6–20)
CO2: 20 mmol/L — ABNORMAL LOW (ref 22–32)
Calcium: 9 mg/dL (ref 8.9–10.3)
Chloride: 106 mmol/L (ref 98–111)
Creatinine, Ser: 0.52 mg/dL (ref 0.44–1.00)
GFR calc Af Amer: >60 mL/min (ref >60)
GFR calc non Af Amer: >60 mL/min (ref >60)
Glucose, Bld: 80 mg/dL (ref 70–99)
Potassium: 2.8 mmol/L — ABNORMAL LOW (ref 3.5–5.1)
Sodium: 138 mmol/L (ref 135–145)
Total Bilirubin: 0.7 mg/dL (ref 0.3–1.2)
Total Protein: 6.7 g/dL (ref 6.5–8.1)

## 2019-01-14 MED ORDER — MELATONIN 3 MG PO TABS
3.0000 mg | ORAL_TABLET | Freq: Once | ORAL | Status: AC
  Administered 2019-01-14: 05:00:00 3 mg via ORAL
  Filled 2019-01-14: qty 1

## 2019-01-14 MED ORDER — NON FORMULARY: 3.0000 mg | Freq: Once | Status: DC

## 2019-01-14 MED ORDER — DIPHENHYDRAMINE HCL 25 MG PO CAPS
25.0000 mg | ORAL_CAPSULE | Freq: Once | ORAL | Status: AC
  Administered 2019-01-14: 25 mg via ORAL
  Filled 2019-01-14: qty 1

## 2019-01-14 MED ORDER — IBUPROFEN 200 MG PO TABS
200.0000 mg | ORAL_TABLET | Freq: Three times a day (TID) | ORAL | Status: DC | PRN
  Administered 2019-01-14 – 2019-01-15 (×2): 400 mg via ORAL
  Filled 2019-01-14 (×2): qty 2

## 2019-01-14 MED ORDER — POTASSIUM CHLORIDE CRYS ER 20 MEQ PO TBCR
40.0000 meq | EXTENDED_RELEASE_TABLET | ORAL | Status: AC
  Administered 2019-01-14 (×2): 40 meq via ORAL
  Filled 2019-01-14 (×2): qty 2

## 2019-01-14 MED ORDER — HYDROCORTISONE 0.5 % EX CREA
TOPICAL_CREAM | Freq: Two times a day (BID) | CUTANEOUS | Status: DC | PRN
  Administered 2019-01-16 (×2): via TOPICAL
  Filled 2019-01-14 (×2): qty 28.35

## 2019-01-14 MED ORDER — AMLODIPINE BESYLATE 5 MG PO TABS
5.0000 mg | ORAL_TABLET | Freq: Every day | ORAL | Status: DC
  Administered 2019-01-14 – 2019-01-16 (×3): 5 mg via ORAL
  Filled 2019-01-14 (×3): qty 1

## 2019-01-14 MED ORDER — PROMETHAZINE HCL 25 MG PO TABS
12.5000 mg | ORAL_TABLET | Freq: Four times a day (QID) | ORAL | Status: DC | PRN
  Filled 2019-01-14: qty 1

## 2019-01-14 NOTE — TOC Transition Note (Signed)
Transition of Care [facility]) - CM/SW Discharge Note   Patient Details  Name: Natasha Pope MRN: [id number] Date of Birth: 25-Nov-1995  Transition of Care [facility]) CM/SW Contact:  Lynnell Catalan, RN Phone Number: 01/14/2019, 1:01 PM   Clinical Narrative:    [facility] contacted to inquire about bed availability. There are beds available however [facility] cannot accept pt with BP and HR elevated as documented. Marney Doctor RN,BSN [phone]

## 2019-01-14 NOTE — Progress Notes (Signed)
[place] Progress Note Patient Name: Natasha Pope [place] DOB: May 23, 1996 MRN: [id number]   Date of Service  01/14/2019  HPI/Events of Note  Headache not relieved by Tylenol. Pt has a history of migraine headaches, no nausea, vomiting or mental status changes, patient does not appear in distress. She states that Advil usually works better for her headaches.  eICU Interventions  Advil 200  - 400 mg po tid prn headache        Ursula Dermody U Alita Waldren 01/14/2019, 2:28 AM

## 2019-01-14 NOTE — Progress Notes (Signed)
PROGRESS NOTE    Natasha Pope [place]  ACZ:[id number] DOB: 1995/09/15 DOA: 01/10/2019 PCP: Orma Flaming, MD     Brief Narrative:  Natasha Pope is a 23 y.o. female with medical history significant of previous psych hospitalizations, substance abuse, alcohol abuse who is brought in to the hospital by her grandmother due to nausea, vomiting, alcohol intoxication, suicidal ideation.  She has had 6 ED visits and 2 inpatient admissions in the past 6 months related to her alcohol abuse.  She admits to some nausea and vomiting, has not been able to take anything p.o. since yesterday.  States that she hurts all over, but no specific complaints.  Denies any fevers or shortness of breath. She was evaluated by psychiatry, they recommend inpatient psych hospitalization.  Due to her continued nausea, vomiting, EDP referred patient for admission for stabilization prior to transfer to inpatient psych placement.  New events last 24 hours / Subjective: Denies any new complaints. Denies any SI/HI. Pt BP still elevated, with tachycardia  Assessment & Plan:   Principal Problem:   Alcohol withdrawal (HCC) Active Problems:   Tobacco abuse   Alcohol abuse   Alcohol use with alcohol-induced mood disorder (HCC)   Seizure (HCC)   Alcohol intoxication and withdrawal Continue CIWA protocol Currently off precedex  Hypertension/tachycardia Likely due to ?withdrawal, although pt reported her BP runs high at home. Not on any meds Will start her on PO amlodipine  Hypokalemia Replace PRN  Hx of alcohol withdrawal seizures -Continue dilantin  -Has been referred to neurology by PCP  Suicidal ideation -Evaluated by psych in the ED, recommend inpatient psych hospitalization -Sitter at bedside and suicide precautions  -Continue prozac  -IVC paperwork filed   QTc prolongation -Avoid QTc prolonging medication. Monitor QTc on telemetry    DVT prophylaxis: SCD Code Status: Full code  Family  Communication: None Disposition Plan: Transfer to inpatient psych once stable    Consultants:   Psych evaluated patient in the emergency department  Procedures:   None  Antimicrobials:  Anti-infectives (From admission, onward)   None       Objective: Vitals:   01/14/19 1157 01/14/19 1200 01/14/19 1300 01/14/19 1349  BP: (!) 153/103 (!) 138/98 (!) 153/101 (!) 143/105  Pulse: (!) 101 (!) 103 100 (!) 110  Resp: (!) 23 (!) 25 19 17   Temp:  98.7 F (37.1 C)  99 F (37.2 C)  TempSrc:  Oral  Oral  SpO2: 98% 99% 100% 100%  Weight:      Height:        Intake/Output Summary (Last 24 hours) at 01/14/2019 1412 Last data filed at 01/14/2019 0900 Gross per 24 hour  Intake 151.27 ml  Output -  Net 151.27 ml   Filed Weights   01/10/19 0942 01/10/19 2109  Weight: 65.8 kg 71.1 kg    Examination:  General: NAD, still with hand tremors  Cardiovascular: S1, S2 present  Respiratory: CTAB  Abdomen: Soft, nontender, nondistended, bowel sounds present  Musculoskeletal: No bilateral pedal edema noted  Skin: Normal  Psychiatry: Normal mood    Data Reviewed: I have personally reviewed following labs and imaging studies  CBC: Recent Labs  Lab 01/10/19 1009 01/11/19 0534 01/14/19 0219  WBC 4.2 3.3* 6.1  NEUTROABS 2.7  --   --   HGB 13.8 13.2 12.3  HCT 42.0 39.9 37.8  MCV 101.7* 103.4* 105.0*  PLT 172 125* 109*   Basic Metabolic Panel: Recent Labs  Lab 01/10/19 1009 01/11/19 0534 01/12/19 0534  01/14/19 0219  NA 136 135 135 138  K 3.5 3.1* 3.5 2.8*  CL 98 101 105 106  CO2 20* 22 19* 20*  GLUCOSE 112* 100* 104* 80  BUN 10 5* <5* 6  CREATININE 0.64 0.56 0.55 0.52  CALCIUM 9.0 9.0 9.2 9.0  MG  --  1.8  --   --    GFR: Estimated Creatinine Clearance: 103.3 mL/min (by C-G formula based on SCr of 0.52 mg/dL). Liver Function Tests: Recent Labs  Lab 01/10/19 1009 01/14/19 0219  AST 238* 44*  ALT 95* 46*  ALKPHOS 144* 115  BILITOT 0.6 0.7  PROT 7.9 6.7   ALBUMIN 4.7 3.8   Recent Labs  Lab 01/10/19 1009  LIPASE 28   No results for input(s): AMMONIA in the last 168 hours. Coagulation Profile: No results for input(s): INR, PROTIME in the last 168 hours. Cardiac Enzymes: No results for input(s): CKTOTAL, CKMB, CKMBINDEX, TROPONINI in the last 168 hours. BNP (last 3 results) No results for input(s): PROBNP in the last 8760 hours. HbA1C: No results for input(s): HGBA1C in the last 72 hours. CBG: No results for input(s): GLUCAP in the last 168 hours. Lipid Profile: No results for input(s): CHOL, HDL, LDLCALC, TRIG, CHOLHDL, LDLDIRECT in the last 72 hours. Thyroid Function Tests: No results for input(s): TSH, T4TOTAL, FREET4, T3FREE, THYROIDAB in the last 72 hours. Anemia Panel: No results for input(s): VITAMINB12, FOLATE, FERRITIN, TIBC, IRON, RETICCTPCT in the last 72 hours. Sepsis Labs: No results for input(s): PROCALCITON, LATICACIDVEN in the last 168 hours.  Recent Results (from the past 240 hour(s))  SARS Coronavirus 2 (CEPHEID - Performed in [facility] lab), Hosp Order     Status: None   Collection Time: 01/10/19  6:20 PM   Specimen: Nasopharyngeal Swab  Result Value Ref Range Status   SARS Coronavirus 2 NEGATIVE NEGATIVE Final    Comment: (NOTE) If result is NEGATIVE SARS-CoV-2 target nucleic acids are NOT DETECTED. The SARS-CoV-2 RNA is generally detectable in upper and lower  respiratory specimens during the acute phase of infection. The lowest  concentration of SARS-CoV-2 viral copies this assay can detect is 250  copies / mL. A negative result does not preclude SARS-CoV-2 infection  and should not be used as the sole basis for treatment or other  patient management decisions.  A negative result may occur with  improper specimen collection / handling, submission of specimen other  than nasopharyngeal swab, presence of viral mutation(s) within the  areas targeted by this assay, and inadequate number of viral  copies  (<250 copies / mL). A negative result must be combined with clinical  observations, patient history, and epidemiological information. If result is POSITIVE SARS-CoV-2 target nucleic acids are DETECTED. The SARS-CoV-2 RNA is generally detectable in upper and lower  respiratory specimens dur ing the acute phase of infection.  Positive  results are indicative of active infection with SARS-CoV-2.  Clinical  correlation with patient history and other diagnostic information is  necessary to determine patient infection status.  Positive results do  not rule out bacterial infection or co-infection with other viruses. If result is PRESUMPTIVE POSTIVE SARS-CoV-2 nucleic acids MAY BE PRESENT.   A presumptive positive result was obtained on the submitted specimen  and confirmed on repeat testing.  While 2019 novel coronavirus  (SARS-CoV-2) nucleic acids may be present in the submitted sample  additional confirmatory testing may be necessary for epidemiological  and / or clinical management purposes  to differentiate between  SARS-CoV-2 and other Sarbecovirus currently known to infect humans.  If clinically indicated additional testing with an alternate test  methodology [phone]) is advised. The SARS-CoV-2 RNA is generally  detectable in upper and lower respiratory sp ecimens during the acute  phase of infection. The expected result is Negative. Fact Sheet for Patients:  BoilerBrush.com.cyhttps://www.fda.gov/media/136312/download Fact Sheet for Healthcare Providers: https://pope.com/https://www.fda.gov/media/136313/download This test is not yet approved or cleared by the [place] FDA and has been authorized for detection and/or diagnosis of SARS-CoV-2 by FDA under an Emergency Use Authorization (EUA).  This EUA will remain in effect (meaning this test can be used) for the duration of the COVID-19 declaration under Section 564(b)(1) of the Act, 21 U.S.C. section 360bbb-3(b)(1), unless the authorization is terminated  or revoked sooner. Performed at [facility], [address] [place], [place] [address]   MRSA PCR Screening     Status: None   Collection Time: 01/12/19  1:50 PM   Specimen: Nasopharyngeal  Result Value Ref Range Status   MRSA by PCR NEGATIVE NEGATIVE Final    Comment:        The GeneXpert MRSA Assay (FDA approved for NASAL specimens only), is one component of a comprehensive MRSA colonization surveillance program. It is not intended to diagnose MRSA infection nor to guide or monitor treatment for MRSA infections. Performed at [facility], [address] [place], [place] [address]        Radiology Studies: No results found.    Scheduled Meds: . Chlorhexidine Gluconate Cloth  6 each Topical Daily  . diazepam  5 mg Oral Q8H  . FLUoxetine  40 mg Oral Daily  . folic acid  1 mg Oral Daily  . multivitamin with minerals  1 tablet Oral Daily  . norethindrone-ethinyl estradiol  1 tablet Oral Daily  . phenytoin  100 mg Oral BID  . sodium chloride flush  3 mL Intravenous Q12H  . thiamine  100 mg Oral Daily   Or  . thiamine  100 mg Intravenous Daily   Continuous Infusions:    LOS: 3 days    Time spent: 25 minutes   Briant CedarNkeiruka J Dorotea Hand, MD [facility] [email or web address] 01/14/2019, 2:12 PM

## 2019-01-14 NOTE — Progress Notes (Signed)
[place] Progress Note Patient Name: Natasha Pope [place] DOB: January 19, 1996 MRN: [id number]   Date of Service  01/14/2019  HPI/Events of Note  Insomnia  eICU Interventions  Melatonin 3 mg po x 1        Okoronkwo U Ogan 01/14/2019, 5:02 AM

## 2019-01-14 NOTE — Progress Notes (Signed)
NAME:  Natasha Pope, MRN:  [id number], DOB:  05/21/1996, LOS: 3 ADMISSION DATE:  01/10/2019, CONSULTATION DATE:  01/12/2019 REFERRING MD:  Dr Alvino Chapelhoi, CHIEF COMPLAINT: Alcohol withdrawal  Brief History   23 yo female admitted 6/20 with alcohol intoxication and suicidal ideation with recommendations for inpatient psych and medical stabilization per psych. Hospital course complicated by alcohol w/d and agitation requiring transfer to ICU for precedex drip on 6/22.  6 ED visits and 2 inpatient admissions in the past 6 months   Past Medical History  Anxiety Asthma  Depression Pyelonephritis Seizures  Significant Hospital Events   6/20: Admit 6/22: Rapid Response for agitation and tx to ICU for Precedex drip  Consults:  PCCM 01/12/2019  Procedures:  None  Significant Diagnostic Tests:  None   Interim history/subjective:  Precedex weaned off 6/23. One time dose prn PO valium overnight. Melatonin for sleep. Tylenol for HA. Denies pain, SOB, N/V this am. Good appetite. K+ replaced.   Objective   Blood pressure (!) 112/96, pulse 87, temperature 98.5 F (36.9 C), temperature source Oral, resp. rate 18, height 5\' 6"  (1.676 m), weight 71.1 kg, SpO2 100 %.        Intake/Output Summary (Last 24 hours) at 01/14/2019 0819 Last data filed at 01/13/2019 2238 Gross per 24 hour  Intake 966.27 ml  Output --  Net 966.27 ml   Filed Weights   01/10/19 0942 01/10/19 2109  Weight: 65.8 kg 71.1 kg    Examination:  General:well developed, well nourished. Lying on L side. NAD  HEENT: Normocephalic. PERRL. MMM Lungs: BBS present, clear, FNL.  Cardiovascular: RRR S1 S2, No MRG. +2 distal pulses Abdomen: +BS x4. SNT/ND Extremities: MAE well. No edema.  Neuro: Resting with eyes closed. Arouses to verbal. Non focal.  Skin: PWD. In tact.    Resolved Hospital Problem list     Assessment & Plan:   Alcohol withdrawal Alcohol Abuse -Allergy to ativan P       -weaned from  precedex  -doing well with scheduled valium -CIWA protocol -MVI, thiamine, Folic acid  -encourage sleep/wake cycle  -will benefit from rehab-see below -SS following    History of alcohol withdrawal seizures P: -No sz this admission  -Continue home phenytoin   Suicidal ideation History of Depression and Anxiety -Evaluated by Psych 6/20, recommended inpatient psych-ready from CC perspective  P: -Continue home Prozac -Sitter remains at bedside  Elevated LFT's -LFTs elevated on admission likely due to acute intoxication P: -LFTs markedly improved.  -no further monitoring   Hypokalemia P: -replace PO -check am Mag, BMP    Best practice:  Diet: Regular, as tolerated Pain/Anxiety/Delirium protocol (if indicated): CIWA VAP protocol (if indicated): Not applicable DVT prophylaxis: SCD Mobility: Mobilize as tolerated Code Status: Full code Disposition: Per TRH. PCCM will sign off. Thank you for the opportunity to participate in this patient's care. Please contact if we can be of further assistance.   Labs   CBC: Recent Labs  Lab 01/10/19 1009 01/11/19 0534 01/14/19 0219  WBC 4.2 3.3* 6.1  NEUTROABS 2.7  --   --   HGB 13.8 13.2 12.3  HCT 42.0 39.9 37.8  MCV 101.7* 103.4* 105.0*  PLT 172 125* 110*    Basic Metabolic Panel: Recent Labs  Lab 01/10/19 1009 01/11/19 0534 01/12/19 0534 01/14/19 0219  NA 136 135 135 138  K 3.5 3.1* 3.5 2.8*  CL 98 101 105 106  CO2 20* 22 19* 20*  GLUCOSE 112* 100* 104* 80  BUN 10 5* <5* 6  CREATININE 0.64 0.56 0.55 0.52  CALCIUM 9.0 9.0 9.2 9.0  MG  --  1.8  --   --    GFR: Estimated Creatinine Clearance: 103.3 mL/min (by C-G formula based on SCr of 0.52 mg/dL). Recent Labs  Lab 01/10/19 1009 01/11/19 0534 01/14/19 0219  WBC 4.2 3.3* 6.1    Liver Function Tests: Recent Labs  Lab 01/10/19 1009 01/14/19 0219  AST 238* 44*  ALT 95* 46*  ALKPHOS 144* 115  BILITOT 0.6 0.7  PROT 7.9 6.7  ALBUMIN 4.7 3.8

## 2019-01-14 NOTE — Progress Notes (Signed)
[place] Progress Note Patient Name: Natasha Pope [place] DOB: 30-Apr-1996 MRN: [id number]   Date of Service  01/14/2019  HPI/Events of Note  Hypokalemia K + 2.8  eICU Interventions  Elink Hypokalemia treatment orders        Frederik Pear 01/14/2019, 3:44 AM

## 2019-01-14 NOTE — Progress Notes (Signed)
   01/14/19 1000  Clinical Encounter Type  Visited With Patient;Other (Comment) (Sitter present in the room)  Visit Type Initial;Psychological support;Spiritual support  Referral From Nurse  Consult/Referral To Chaplain  Spiritual Encounters  Spiritual Needs Prayer;Emotional;Other (Comment) (Spiritual Care Conversation/Support)  Stress Factors  Patient Stress Factors Health changes   I visited with Natasha Pope per spiritual care consult. She was appreciative of my visit. She requested that I pray with her, which I did.   Please, contact Spiritual Care for further assistance.   Chaplain Shanon Ace M.Div., [facility]

## 2019-01-14 NOTE — Progress Notes (Signed)
[place] Progress Note Patient Name: Natasha Pope [place] DOB: [birth year] MRN: [id number]   Date of Service  01/14/2019  HPI/Events of Note  Pt is asking for something for sleep  eICU Interventions  Melatonin 3 mg po x 1        Okoronkwo U Ogan 01/14/2019, 4:46 AM

## 2019-01-15 LAB — BASIC METABOLIC PANEL
Anion gap: 10 (ref 5–15)
BUN: 5 mg/dL — ABNORMAL LOW (ref 6–20)
CO2: 22 mmol/L (ref 22–32)
Calcium: 9.2 mg/dL (ref 8.9–10.3)
Chloride: 106 mmol/L (ref 98–111)
Creatinine, Ser: 0.52 mg/dL (ref 0.44–1.00)
GFR calc Af Amer: >60 mL/min (ref >60)
GFR calc non Af Amer: >60 mL/min (ref >60)
Glucose, Bld: 98 mg/dL (ref 70–99)
Potassium: 3 mmol/L — ABNORMAL LOW (ref 3.5–5.1)
Sodium: 138 mmol/L (ref 135–145)

## 2019-01-15 LAB — CBC WITH DIFFERENTIAL/PLATELET
Abs Immature Granulocytes: 0.02 10*3/uL (ref 0.00–0.07)
Basophils Absolute: 0 10*3/uL (ref 0.0–0.1)
Basophils Relative: 1 %
Eosinophils Absolute: 0.1 10*3/uL (ref 0.0–0.5)
Eosinophils Relative: 3 %
HCT: 38.7 % (ref 36.0–46.0)
Hemoglobin: 12.6 g/dL (ref 12.0–15.0)
Immature Granulocytes: 0 %
Lymphocytes Relative: 29 %
Lymphs Abs: 1.6 10*3/uL (ref 0.7–4.0)
MCH: 34.1 pg — ABNORMAL HIGH (ref 26.0–34.0)
MCHC: 32.6 g/dL (ref 30.0–36.0)
MCV: 104.9 fL — ABNORMAL HIGH (ref 80.0–100.0)
Monocytes Absolute: 0.6 10*3/uL (ref 0.1–1.0)
Monocytes Relative: 11 %
Neutro Abs: 3.1 10*3/uL (ref 1.7–7.7)
Neutrophils Relative %: 56 %
Platelets: 124 10*3/uL — ABNORMAL LOW (ref 150–400)
RBC: 3.69 MIL/uL — ABNORMAL LOW (ref 3.87–5.11)
RDW: 14.8 % (ref 11.5–15.5)
WBC: 5.4 10*3/uL (ref 4.0–10.5)
nRBC: 0 % (ref 0.0–0.2)

## 2019-01-15 LAB — PHENYTOIN LEVEL, TOTAL: Phenytoin Lvl: <2.5 ug/mL — ABNORMAL LOW (ref 10.0–20.0)

## 2019-01-15 LAB — MAGNESIUM: Magnesium: 1.8 mg/dL (ref 1.7–2.4)

## 2019-01-15 MED ORDER — POTASSIUM CHLORIDE CRYS ER 20 MEQ PO TBCR
40.0000 meq | EXTENDED_RELEASE_TABLET | Freq: Once | ORAL | Status: AC
  Administered 2019-01-15: 40 meq via ORAL
  Filled 2019-01-15: qty 2

## 2019-01-15 MED ORDER — TRAZODONE HCL 50 MG PO TABS
50.0000 mg | ORAL_TABLET | Freq: Every evening | ORAL | Status: DC | PRN
  Administered 2019-01-15: 50 mg via ORAL
  Filled 2019-01-15: qty 1

## 2019-01-15 NOTE — Progress Notes (Signed)
PROGRESS NOTE    Natasha Pope  ZOX:[id number] DOB: 08/08/1995 DOA: 01/10/2019 PCP: Orland MustardWolfe, Allison, MD     Brief Narrative:  Natasha Pope is a 23 y.o. female with medical history significant of previous psych hospitalizations, substance abuse, alcohol abuse who is brought in to the hospital by her grandmother due to nausea, vomiting, alcohol intoxication, suicidal ideation.  She has had 6 ED visits and 2 inpatient admissions in the past 6 months related to her alcohol abuse.  She admits to some nausea and vomiting, has not been able to take anything p.o. since yesterday.  States that she hurts all over, but no specific complaints.  Denies any fevers or shortness of breath. She was evaluated by psychiatry, they recommend inpatient psych hospitalization.  Due to her continued nausea, vomiting, EDP referred patient for admission for stabilization prior to transfer to inpatient psych placement.  New events last 24 hours / Subjective: Denies any new complaints, feeling much better.  Denies any SI/HI.  Stable to transfer to inpatient psych  Assessment & Plan:   Principal Problem:   Alcohol withdrawal (HCC) Active Problems:   Tobacco abuse   Alcohol abuse   Alcohol use with alcohol-induced mood disorder (HCC)   Seizure (HCC)   Alcohol intoxication and withdrawal Continue CIWA protocol Currently off precedex  Hypertension/tachycardia Improving Likely due to ?withdrawal, although pt reported her BP runs high at home. Not on any meds Continue PO amlodipine  Hypokalemia Replace PRN  Hx of alcohol withdrawal seizures -Continue dilantin, levels pending -Has been referred to neurology by PCP  Suicidal ideation -Evaluated by psych in the ED, recommend inpatient psych hospitalization -Sitter at bedside and suicide precautions  -Continue prozac  -IVC paperwork filed   QTc prolongation -Avoid QTc prolonging medication. Monitor QTc on telemetry    DVT  prophylaxis: SCD Code Status: Full code  Family Communication: None Disposition Plan: Stable to transfer to inpatient psych    Consultants:   Psych evaluated patient in the emergency department  Procedures:   None  Antimicrobials:  Anti-infectives (From admission, onward)   None       Objective: Vitals:   01/14/19 1349 01/15/19 0606 01/15/19 0745 01/15/19 1230  BP: (!) 143/105 (!) 144/99 140/82 (!) 143/86  Pulse: (!) 110 88  90  Resp: 17 17    Temp: 99 F (37.2 C) 98.6 F (37 C)    TempSrc: Oral Oral    SpO2: 100% 98%    Weight:      Height:        Intake/Output Summary (Last 24 hours) at 01/15/2019 1429 Last data filed at 01/15/2019 0836 Gross per 24 hour  Intake 600 ml  Output 100 ml  Net 500 ml   Filed Weights   01/10/19 0942 01/10/19 2109  Weight: 65.8 kg 71.1 kg    Examination:  General: NAD, still with mild hand tremors  Cardiovascular: S1, S2 present  Respiratory: CTAB  Abdomen: Soft, nontender, nondistended, bowel sounds present  Musculoskeletal: No bilateral pedal edema noted  Skin: Normal  Psychiatry: Normal mood    Data Reviewed: I have personally reviewed following labs and imaging studies  CBC: Recent Labs  Lab 01/10/19 1009 01/11/19 0534 01/14/19 0219 01/15/19 0529  WBC 4.2 3.3* 6.1 5.4  NEUTROABS 2.7  --   --  3.1  HGB 13.8 13.2 12.3 12.6  HCT 42.0 39.9 37.8 38.7  MCV 101.7* 103.4* 105.0* 104.9*  PLT 172 125* 110* 124*   Basic Metabolic Panel: Recent  Labs  Lab 01/10/19 1009 01/11/19 0534 01/12/19 0534 01/14/19 0219 01/15/19 0529  NA 136 135 135 138 138  K 3.5 3.1* 3.5 2.8* 3.0*  CL 98 101 105 106 106  CO2 20* 22 19* 20* 22  GLUCOSE 112* 100* 104* 80 98  BUN 10 5* <5* 6 5*  CREATININE 0.64 0.56 0.55 0.52 0.52  CALCIUM 9.0 9.0 9.2 9.0 9.2  MG  --  1.8  --   --  1.8   GFR: Estimated Creatinine Clearance: 103.3 mL/min (by C-G formula based on SCr of 0.52 mg/dL). Liver Function Tests: Recent Labs  Lab  01/10/19 1009 01/14/19 0219  AST 238* 44*  ALT 95* 46*  ALKPHOS 144* 115  BILITOT 0.6 0.7  PROT 7.9 6.7  ALBUMIN 4.7 3.8   Recent Labs  Lab 01/10/19 1009  LIPASE 28   No results for input(s): AMMONIA in the last 168 hours. Coagulation Profile: No results for input(s): INR, PROTIME in the last 168 hours. Cardiac Enzymes: No results for input(s): CKTOTAL, CKMB, CKMBINDEX, TROPONINI in the last 168 hours. BNP (last 3 results) No results for input(s): PROBNP in the last 8760 hours. HbA1C: No results for input(s): HGBA1C in the last 72 hours. CBG: No results for input(s): GLUCAP in the last 168 hours. Lipid Profile: No results for input(s): CHOL, HDL, LDLCALC, TRIG, CHOLHDL, LDLDIRECT in the last 72 hours. Thyroid Function Tests: No results for input(s): TSH, T4TOTAL, FREET4, T3FREE, THYROIDAB in the last 72 hours. Anemia Panel: No results for input(s): VITAMINB12, FOLATE, FERRITIN, TIBC, IRON, RETICCTPCT in the last 72 hours. Sepsis Labs: No results for input(s): PROCALCITON, LATICACIDVEN in the last 168 hours.  Recent Results (from the past 240 hour(s))  SARS Coronavirus 2 (CEPHEID - Performed in [facility] hospital lab), Hosp Order     Status: None   Collection Time: 01/10/19  6:20 PM   Specimen: Nasopharyngeal Swab  Result Value Ref Range Status   SARS Coronavirus 2 NEGATIVE NEGATIVE Final    Comment: (NOTE) If result is NEGATIVE SARS-CoV-2 target nucleic acids are NOT DETECTED. The SARS-CoV-2 RNA is generally detectable in upper and lower  respiratory specimens during the acute phase of infection. The lowest  concentration of SARS-CoV-2 viral copies this assay can detect is 250  copies / mL. A negative result does not preclude SARS-CoV-2 infection  and should not be used as the sole basis for treatment or other  patient management decisions.  A negative result may occur with  improper specimen collection / handling, submission of specimen other  than nasopharyngeal  swab, presence of viral mutation(s) within the  areas targeted by this assay, and inadequate number of viral copies  (<250 copies / mL). A negative result must be combined with clinical  observations, patient history, and epidemiological information. If result is POSITIVE SARS-CoV-2 target nucleic acids are DETECTED. The SARS-CoV-2 RNA is generally detectable in upper and lower  respiratory specimens dur ing the acute phase of infection.  Positive  results are indicative of active infection with SARS-CoV-2.  Clinical  correlation with patient history and other diagnostic information is  necessary to determine patient infection status.  Positive results do  not rule out bacterial infection or co-infection with other viruses. If result is PRESUMPTIVE POSTIVE SARS-CoV-2 nucleic acids MAY BE PRESENT.   A presumptive positive result was obtained on the submitted specimen  and confirmed on repeat testing.  While 2019 novel coronavirus  (SARS-CoV-2) nucleic acids may be present in the submitted sample  additional confirmatory testing may be necessary for epidemiological  and / or clinical management purposes  to differentiate between  SARS-CoV-2 and other Sarbecovirus currently known to infect humans.  If clinically indicated additional testing with an alternate test  methodology [phone]) is advised. The SARS-CoV-2 RNA is generally  detectable in upper and lower respiratory sp ecimens during the acute  phase of infection. The expected result is Negative. Fact Sheet for Patients:  StrictlyIdeas.no Fact Sheet for Healthcare Providers: BankingDealers.co.za This test is not yet approved or cleared by the [place] FDA and has been authorized for detection and/or diagnosis of SARS-CoV-2 by FDA under an Emergency Use Authorization (EUA).  This EUA will remain in effect (meaning this test can be used) for the duration of the COVID-19 declaration  under Section 564(b)(1) of the Act, 21 U.S.C. section 360bbb-3(b)(1), unless the authorization is terminated or revoked sooner. Performed at [facility], [place] [address] [place], [place] [address]   MRSA PCR Screening     Status: None   Collection Time: 01/12/19  1:50 PM   Specimen: Nasopharyngeal  Result Value Ref Range Status   MRSA by PCR NEGATIVE NEGATIVE Final    Comment:        The GeneXpert MRSA Assay (FDA approved for NASAL specimens only), is one component of a comprehensive MRSA colonization surveillance program. It is not intended to diagnose MRSA infection nor to guide or monitor treatment for MRSA infections. Performed at [facility], [place] [address], [place], [place] [address]        Radiology Studies: No results found.    Scheduled Meds: . amLODipine  5 mg Oral Daily  . Chlorhexidine Gluconate Cloth  6 each Topical Daily  . diazepam  5 mg Oral Q8H  . FLUoxetine  40 mg Oral Daily  . folic acid  1 mg Oral Daily  . multivitamin with minerals  1 tablet Oral Daily  . norethindrone-ethinyl estradiol  1 tablet Oral Daily  . phenytoin  100 mg Oral BID  . sodium chloride flush  3 mL Intravenous Q12H  . thiamine  100 mg Oral Daily   Or  . thiamine  100 mg Intravenous Daily   Continuous Infusions:    LOS: 4 days     Alma Friendly, MD [facility] [email or web address] 01/15/2019, 2:29 PM

## 2019-01-16 ENCOUNTER — Encounter (HOSPITAL_COMMUNITY): Payer: Self-pay

## 2019-01-16 ENCOUNTER — Inpatient Hospital Stay (HOSPITAL_COMMUNITY)
Admission: AD | Admit: 2019-01-16 | Discharge: 2019-01-20 | DRG: 897 | Disposition: A | Discharge status: Home / Self Care | Payer: 59 | Attending: Psychiatry | Admitting: Psychiatry

## 2019-01-16 DIAGNOSIS — F410 Panic disorder [episodic paroxysmal anxiety] without agoraphobia: Secondary | ICD-10-CM | POA: Diagnosis present

## 2019-01-16 DIAGNOSIS — F1014 Alcohol abuse with alcohol-induced mood disorder: Secondary | ICD-10-CM | POA: Diagnosis not present

## 2019-01-16 DIAGNOSIS — F1010 Alcohol abuse, uncomplicated: Secondary | ICD-10-CM | POA: Diagnosis not present

## 2019-01-16 DIAGNOSIS — Y906 Blood alcohol level of 120-199 mg/100 ml: Secondary | ICD-10-CM | POA: Diagnosis present

## 2019-01-16 DIAGNOSIS — Z87891 Personal history of nicotine dependence: Secondary | ICD-10-CM | POA: Diagnosis not present

## 2019-01-16 DIAGNOSIS — I10 Essential (primary) hypertension: Secondary | ICD-10-CM | POA: Diagnosis present

## 2019-01-16 DIAGNOSIS — F10229 Alcohol dependence with intoxication, unspecified: Secondary | ICD-10-CM | POA: Diagnosis present

## 2019-01-16 DIAGNOSIS — F1024 Alcohol dependence with alcohol-induced mood disorder: Secondary | ICD-10-CM | POA: Diagnosis present

## 2019-01-16 DIAGNOSIS — F10232 Alcohol dependence with withdrawal with perceptual disturbance: Secondary | ICD-10-CM | POA: Diagnosis not present

## 2019-01-16 DIAGNOSIS — F064 Anxiety disorder due to known physiological condition: Secondary | ICD-10-CM | POA: Diagnosis present

## 2019-01-16 DIAGNOSIS — F13288 Sedative, hypnotic or anxiolytic dependence with other sedative, hypnotic or anxiolytic-induced disorder: Secondary | ICD-10-CM | POA: Diagnosis not present

## 2019-01-16 DIAGNOSIS — R569 Unspecified convulsions: Secondary | ICD-10-CM | POA: Diagnosis not present

## 2019-01-16 DIAGNOSIS — F1094 Alcohol use, unspecified with alcohol-induced mood disorder: Secondary | ICD-10-CM | POA: Diagnosis not present

## 2019-01-16 LAB — BASIC METABOLIC PANEL
Anion gap: 13 (ref 5–15)
BUN: 5 mg/dL — ABNORMAL LOW (ref 6–20)
CO2: 21 mmol/L — ABNORMAL LOW (ref 22–32)
Calcium: 9.5 mg/dL (ref 8.9–10.3)
Chloride: 107 mmol/L (ref 98–111)
Creatinine, Ser: 0.53 mg/dL (ref 0.44–1.00)
GFR calc Af Amer: >60 mL/min (ref >60)
GFR calc non Af Amer: >60 mL/min (ref >60)
Glucose, Bld: 106 mg/dL — ABNORMAL HIGH (ref 70–99)
Potassium: 3.1 mmol/L — ABNORMAL LOW (ref 3.5–5.1)
Sodium: 141 mmol/L (ref 135–145)

## 2019-01-16 MED ORDER — ADULT MULTIVITAMIN W/MINERALS CH
1.0000 | ORAL_TABLET | Freq: Every day | ORAL | Status: DC
  Administered 2019-01-17 – 2019-01-20 (×4): 1 via ORAL
  Filled 2019-01-16 (×6): qty 1

## 2019-01-16 MED ORDER — DIAZEPAM 5 MG PO TABS
5.0000 mg | ORAL_TABLET | ORAL | Status: DC | PRN
  Administered 2019-01-16 (×3): 5 mg via ORAL
  Filled 2019-01-16 (×3): qty 1

## 2019-01-16 MED ORDER — AMLODIPINE BESYLATE 5 MG PO TABS: 5.0000 mg | ORAL_TABLET | Freq: Every day | ORAL | Status: DC

## 2019-01-16 MED ORDER — MAGNESIUM HYDROXIDE 400 MG/5ML PO SUSP: 30.0000 mL | Freq: Every day | ORAL | Status: DC | PRN

## 2019-01-16 MED ORDER — ACETAMINOPHEN 325 MG PO TABS: 650.0000 mg | ORAL_TABLET | Freq: Four times a day (QID) | ORAL | Status: DC | PRN

## 2019-01-16 MED ORDER — PHENYTOIN SODIUM EXTENDED 100 MG PO CAPS
100.0000 mg | ORAL_CAPSULE | Freq: Three times a day (TID) | ORAL | Status: DC
  Administered 2019-01-17 – 2019-01-20 (×11): 100 mg via ORAL
  Filled 2019-01-16 (×17): qty 1

## 2019-01-16 MED ORDER — AMLODIPINE BESYLATE 5 MG PO TABS
5.0000 mg | ORAL_TABLET | Freq: Every day | ORAL | Status: DC
  Administered 2019-01-17 – 2019-01-18 (×2): 5 mg via ORAL
  Filled 2019-01-16 (×4): qty 1

## 2019-01-16 MED ORDER — DIAZEPAM 5 MG PO TABS: 5.0000 mg | ORAL_TABLET | Freq: Three times a day (TID) | ORAL | 0 refills | Status: DC

## 2019-01-16 MED ORDER — FOLIC ACID 1 MG PO TABS
1.0000 mg | ORAL_TABLET | Freq: Every day | ORAL | Status: DC
  Administered 2019-01-17: 1 mg via ORAL
  Filled 2019-01-16 (×3): qty 1

## 2019-01-16 MED ORDER — PHENYTOIN SODIUM EXTENDED 100 MG PO CAPS: 100.0000 mg | ORAL_CAPSULE | Freq: Three times a day (TID) | ORAL | Status: DC

## 2019-01-16 MED ORDER — VITAMIN B-1 100 MG PO TABS
100.0000 mg | ORAL_TABLET | Freq: Every day | ORAL | Status: DC
  Administered 2019-01-17 – 2019-01-20 (×4): 100 mg via ORAL
  Filled 2019-01-16 (×6): qty 1

## 2019-01-16 MED ORDER — THIAMINE HCL 100 MG/ML IJ SOLN: 100.0000 mg | Freq: Every day | INTRAMUSCULAR | Status: DC

## 2019-01-16 MED ORDER — TRAZODONE HCL 50 MG PO TABS
50.0000 mg | ORAL_TABLET | Freq: Every evening | ORAL | Status: DC | PRN
  Administered 2019-01-16: 50 mg via ORAL
  Filled 2019-01-16: qty 1

## 2019-01-16 MED ORDER — FLUOXETINE HCL 20 MG PO CAPS
40.0000 mg | ORAL_CAPSULE | Freq: Every day | ORAL | Status: DC
  Administered 2019-01-17: 40 mg via ORAL
  Filled 2019-01-16 (×3): qty 2

## 2019-01-16 MED ORDER — DIAZEPAM 5 MG PO TABS
5.0000 mg | ORAL_TABLET | Freq: Three times a day (TID) | ORAL | Status: DC
  Administered 2019-01-17 – 2019-01-19 (×7): 5 mg via ORAL
  Filled 2019-01-16 (×7): qty 1

## 2019-01-16 MED ORDER — PROMETHAZINE HCL 25 MG PO TABS: 12.5000 mg | ORAL_TABLET | Freq: Four times a day (QID) | ORAL | Status: DC | PRN

## 2019-01-16 MED ORDER — SODIUM CHLORIDE 0.9 % IV SOLN
750.0000 mg | Freq: Once | INTRAVENOUS | Status: AC
  Administered 2019-01-16: 750 mg via INTRAVENOUS
  Filled 2019-01-16: qty 15

## 2019-01-16 MED ORDER — ALUM & MAG HYDROXIDE-SIMETH 200-200-20 MG/5ML PO SUSP: 30.0000 mL | ORAL | Status: DC | PRN

## 2019-01-16 MED ORDER — POTASSIUM CHLORIDE CRYS ER 20 MEQ PO TBCR
40.0000 meq | EXTENDED_RELEASE_TABLET | Freq: Once | ORAL | Status: AC
  Administered 2019-01-16: 40 meq via ORAL
  Filled 2019-01-16: qty 2

## 2019-01-16 MED ORDER — THIAMINE HCL 100 MG PO TABS: 100.0000 mg | ORAL_TABLET | Freq: Every day | ORAL | Status: DC

## 2019-01-16 MED ORDER — HYDROCORTISONE 0.5 % EX CREA: TOPICAL_CREAM | Freq: Two times a day (BID) | CUTANEOUS | Status: DC | PRN

## 2019-01-16 MED ORDER — IBUPROFEN 200 MG PO TABS: 200.0000 mg | ORAL_TABLET | Freq: Three times a day (TID) | ORAL | 0 refills | Status: DC | PRN

## 2019-01-16 MED ORDER — PHENYTOIN SODIUM EXTENDED 100 MG PO CAPS
100.0000 mg | ORAL_CAPSULE | Freq: Three times a day (TID) | ORAL | Status: DC
  Administered 2019-01-16 (×2): 100 mg via ORAL
  Filled 2019-01-16 (×2): qty 1

## 2019-01-16 MED ORDER — ALUM & MAG HYDROXIDE-SIMETH 200-200-20 MG/5ML PO SUSP: 30.0000 mL | Freq: Four times a day (QID) | ORAL | 0 refills | Status: DC | PRN

## 2019-01-16 NOTE — Progress Notes (Signed)
IVC paperwork faxed to Toula Moos, LCSW at [place]. Waiting for [facility] to call when ready for pt to come. Law Enforcement [phone]) will need to transport pt.

## 2019-01-16 NOTE — Discharge Summary (Signed)
Discharge Summary  Natasha CoolerVictoria Ann [place]Washington YNW:[id number] DOB: 09/10/1995  PCP: Orland MustardWolfe, Allison, MD  Admit date: 01/10/2019 Discharge date: 01/16/2019  Time spent: 35 mins  Recommendations for Outpatient Follow-up:  1. Transfer to Inpatient psych- Repeat phenytoin levels in about 5 days  Discharge Diagnoses:  Active Hospital Problems   Diagnosis Date Noted   Alcohol withdrawal (HCC) 01/10/2019   Seizure (HCC) 12/26/2018   Alcohol use with alcohol-induced mood disorder (HCC)    Alcohol abuse 12/09/2018   Tobacco abuse 01/31/2018    Resolved Hospital Problems  No resolved problems to display.    Discharge Condition: Stable  Diet recommendation: Regular, as tolerated  Vitals:   01/15/19 2114 01/16/19 0449  BP: (!) 136/97 123/86  Pulse:  91  Resp:  16  Temp:  98.7 F (37.1 C)  SpO2:  100%    History of present illness:  Natasha CoolerVictoria Ann Washingtonis a 23 y.o.femalewith medical history significant ofprevious psych hospitalizations, substance abuse, alcohol abuse who is brought in to the hospital by her grandmother due to nausea, vomiting, alcohol intoxication, suicidal ideation.She has had 6 ED visits and 2 inpatient admissions in the past 6 months related to her alcohol abuse. She admits to some nausea and vomiting, has not been able to take anything p.o. since yesterday. States that she hurts all over, but no specific complaints. Denies any fevers or shortness of breath. She was evaluated by psychiatry, they recommend inpatient psych hospitalization. Due to her continued nausea, vomiting, EDP referred patient for admission for stabilization prior to transfer to inpatient psych placement.   Today, patient denies any new complaints.  Stable for discharge to inpatient psych for rehab and further management.  Hospital Course:  Principal Problem:   Alcohol withdrawal (HCC) Active Problems:   Tobacco abuse   Alcohol abuse   Alcohol use with alcohol-induced mood  disorder (HCC)   Seizure (HCC)   Alcohol intoxication and withdrawal Stable S/P precedex, CIWA protocol Advised to quit alcohol Further management in inpatient psych  Hypertension/tachycardia Improved Likely due to ?withdrawal, although pt reported her BP runs high at home. Not on any meds prior to arrival in the hospital Continue PO amlodipine, may adjust dose/discontinue pending BP readings  Hypokalemia Replace PRN  Hx of alcohol withdrawal seizures Phenytoin levels subtherapeutic: <2.5 Status post loading dose of phenytoin on 01/16/2019 Increase maintenance dose 200 mg 3 times daily Check another phenytoin level in about 5 days Has been referred to neurology by PCP  Suicidal ideation/anxiety/depression Evaluated by psych in the ED, recommend inpatient psych hospitalization Continue prozac  IVC paperwork filed   QTc prolongation Avoid QTc prolonging medication         Malnutrition Type:      Malnutrition Characteristics:      Nutrition Interventions:      Estimated body mass index is 25.3 kg/m as calculated from the following:   Height as of this encounter: 5\' 6"  (1.676 m).   Weight as of this encounter: 71.1 kg.    Procedures:  None  Consultations:  Psych  PCCM  Discharge Exam: BP 123/86 (BP Location: Left Arm)    Pulse 91    Temp 98.7 F (37.1 C) (Oral)    Resp 16    Ht 5\' 6"  (1.676 m)    Wt 71.1 kg    LMP  (LMP Unknown)    SpO2 100%    BMI 25.30 kg/m   General: NAD Cardiovascular: S1, S2 present Respiratory: CTA B  Discharge Instructions You were cared  for by a hospitalist during your hospital stay. If you have any questions about your discharge medications or the care you received while you were in the hospital after you are discharged, you can call the unit and asked to speak with the hospitalist on call if the hospitalist that took care of you is not available. Once you are discharged, your primary care physician will handle  any further medical issues. Please note that NO REFILLS for any discharge medications will be authorized once you are discharged, as it is imperative that you return to your primary care physician (or establish a relationship with a primary care physician if you do not have one) for your aftercare needs so that they can reassess your need for medications and monitor your lab values.   Allergies as of 01/16/2019      Reactions   Ativan [lorazepam] Other (See Comments)   Mental status worsens with benzo, hallucination, agitation      Medication List    TAKE these medications   Alive Once Daily Womens Tabs Take 1 tablet by mouth daily.   alum & mag hydroxide-simeth 200-200-20 MG/5ML suspension Commonly known as: MAALOX/MYLANTA Take 30 mLs by mouth every 6 (six) hours as needed for indigestion or heartburn.   amLODipine 5 MG tablet Commonly known as: NORVASC Take 1 tablet (5 mg total) by mouth daily. Start taking on: January 17, 2019   diazepam 5 MG tablet Commonly known as: VALIUM Take 1 tablet (5 mg total) by mouth every 8 (eight) hours.   FLUoxetine 40 MG capsule Commonly known as: PROZAC Take 1 capsule (40 mg total) by mouth daily.   folic acid 1 MG tablet Commonly known as: FOLVITE Take 1 tablet (1 mg total) by mouth daily.   ibuprofen 200 MG tablet Commonly known as: ADVIL Take 1-2 tablets (200-400 mg total) by mouth 3 (three) times daily as needed for headache or moderate pain.   norethindrone-ethinyl estradiol 1-20 MG-MCG tablet Commonly known as: LOESTRIN FE Take 1 tablet by mouth daily.   phenytoin 100 MG ER capsule Commonly known as: DILANTIN Take 1 capsule (100 mg total) by mouth 3 (three) times daily. What changed: when to take this   Potassium Chloride ER 20 MEQ Tbcr Take 20 mEq by mouth daily.   thiamine 100 MG tablet Take 1 tablet (100 mg total) by mouth daily. Start taking on: January 17, 2019   traZODone 50 MG tablet Commonly known as: DESYREL Take 1  tablet (50 mg total) by mouth at bedtime as needed for sleep.      Allergies  Allergen Reactions   Ativan [Lorazepam] Other (See Comments)    Mental status worsens with benzo, hallucination, agitation      The results of significant diagnostics from this hospitalization (including imaging, microbiology, ancillary and laboratory) are listed below for reference.    Significant Diagnostic Studies: Dg Chest 2 View  Result Date: 12/26/2018 CLINICAL DATA:  Seizure for 1-2 minutes in the shower this morning, fall, headache, sore all over, central chest pain and shortness of breath EXAM: CHEST - 2 VIEW COMPARISON:  11/15/2018 FINDINGS: Normal heart size, mediastinal contours, and pulmonary vascularity. Lungs clear. No pleural effusion or pneumothorax. Bones unremarkable. IMPRESSION: Normal exam. Electronically Signed   By: Ulyses SouthwardMark  Boles M.D.   On: 12/26/2018 11:41   Ct Head Wo Contrast  Result Date: 12/26/2018 CLINICAL DATA:  Seizure with headache EXAM: CT HEAD WITHOUT CONTRAST TECHNIQUE: Contiguous axial images were obtained from the base of the  skull through the vertex without intravenous contrast. COMPARISON:  Dec 15, 2018 FINDINGS: Brain: The ventricles are normal in size and configuration. There is no intracranial mass, hemorrhage, extra-axial fluid collection, or midline shift. Brain parenchyma appears unremarkable. No acute infarct is evident. Vascular: There is no hyperdense vessel. No vascular calcification evident. Skull: The bony calvarium appears intact. Sinuses/Orbits: Visualized paranasal sinuses are clear. Visualized orbits appear symmetric bilaterally. Other: Mastoid air cells are clear. IMPRESSION: Study within normal limits. Electronically Signed   By: Lowella Grip III M.D.   On: 12/26/2018 13:32   Dg Foot Complete Right  Result Date: 12/31/2018 CLINICAL DATA:  Injury 5 days ago with pain. EXAM: RIGHT FOOT COMPLETE - 3+ VIEW COMPARISON:  None. FINDINGS: There is no evidence of  fracture or dislocation. There is no evidence of arthropathy or other focal bone abnormality. Soft tissues are unremarkable. IMPRESSION: Negative. Electronically Signed   By: Dorise Bullion III M.D   On: 12/31/2018 17:46    Microbiology: Recent Results (from the past 240 hour(s))  SARS Coronavirus 2 (CEPHEID - Performed in [place] hospital lab), Hosp Order     Status: None   Collection Time: 01/10/19  6:20 PM   Specimen: Nasopharyngeal Swab  Result Value Ref Range Status   SARS Coronavirus 2 NEGATIVE NEGATIVE Final    Comment: (NOTE) If result is NEGATIVE SARS-CoV-2 target nucleic acids are NOT DETECTED. The SARS-CoV-2 RNA is generally detectable in upper and lower  respiratory specimens during the acute phase of infection. The lowest  concentration of SARS-CoV-2 viral copies this assay can detect is 250  copies / mL. A negative result does not preclude SARS-CoV-2 infection  and should not be used as the sole basis for treatment or other  patient management decisions.  A negative result may occur with  improper specimen collection / handling, submission of specimen other  than nasopharyngeal swab, presence of viral mutation(s) within the  areas targeted by this assay, and inadequate number of viral copies  (<250 copies / mL). A negative result must be combined with clinical  observations, patient history, and epidemiological information. If result is POSITIVE SARS-CoV-2 target nucleic acids are DETECTED. The SARS-CoV-2 RNA is generally detectable in upper and lower  respiratory specimens dur ing the acute phase of infection.  Positive  results are indicative of active infection with SARS-CoV-2.  Clinical  correlation with patient history and other diagnostic information is  necessary to determine patient infection status.  Positive results do  not rule out bacterial infection or co-infection with other viruses. If result is PRESUMPTIVE POSTIVE SARS-CoV-2 nucleic acids MAY BE  PRESENT.   A presumptive positive result was obtained on the submitted specimen  and confirmed on repeat testing.  While 2019 novel coronavirus  (SARS-CoV-2) nucleic acids may be present in the submitted sample  additional confirmatory testing may be necessary for epidemiological  and / or clinical management purposes  to differentiate between  SARS-CoV-2 and other Sarbecovirus currently known to infect humans.  If clinically indicated additional testing with an alternate test  methodology [phone]) is advised. The SARS-CoV-2 RNA is generally  detectable in upper and lower respiratory sp ecimens during the acute  phase of infection. The expected result is Negative. Fact Sheet for Patients:  StrictlyIdeas.no Fact Sheet for Healthcare Providers: BankingDealers.co.za This test is not yet approved or cleared by the [place] FDA and has been authorized for detection and/or diagnosis of SARS-CoV-2 by FDA under an Emergency Use Authorization (EUA).  This  EUA will remain in effect (meaning this test can be used) for the duration of the COVID-19 declaration under Section 564(b)(1) of the Act, 21 U.S.C. section 360bbb-3(b)(1), unless the authorization is terminated or revoked sooner. Performed at [facility], [address] [place], [place] [address]   MRSA PCR Screening     Status: None   Collection Time: 01/12/19  1:50 PM   Specimen: Nasopharyngeal  Result Value Ref Range Status   MRSA by PCR NEGATIVE NEGATIVE Final    Comment:        The GeneXpert MRSA Assay (FDA approved for NASAL specimens only), is one component of a comprehensive MRSA colonization surveillance program. It is not intended to diagnose MRSA infection nor to guide or monitor treatment for MRSA infections. Performed at [facility], [address] [place], [place] [address]      Labs: Basic Metabolic Panel: Recent Labs    Lab 01/11/19 0534 01/12/19 0534 01/14/19 0219 01/15/19 0529 01/16/19 0448  NA 135 135 138 138 141  K 3.1* 3.5 2.8* 3.0* 3.1*  CL 101 105 106 106 107  CO2 22 19* 20* 22 21*  GLUCOSE 100* 104* 80 98 106*  BUN 5* <5* 6 5* 5*  CREATININE 0.56 0.55 0.52 0.52 0.53  CALCIUM 9.0 9.2 9.0 9.2 9.5  MG 1.8  --   --  1.8  --    Liver Function Tests: Recent Labs  Lab 01/10/19 1009 01/14/19 0219  AST 238* 44*  ALT 95* 46*  ALKPHOS 144* 115  BILITOT 0.6 0.7  PROT 7.9 6.7  ALBUMIN 4.7 3.8   Recent Labs  Lab 01/10/19 1009  LIPASE 28   No results for input(s): AMMONIA in the last 168 hours. CBC: Recent Labs  Lab 01/10/19 1009 01/11/19 0534 01/14/19 0219 01/15/19 0529  WBC 4.2 3.3* 6.1 5.4  NEUTROABS 2.7  --   --  3.1  HGB 13.8 13.2 12.3 12.6  HCT 42.0 39.9 37.8 38.7  MCV 101.7* 103.4* 105.0* 104.9*  PLT 172 125* 110* 124*   Cardiac Enzymes: No results for input(s): CKTOTAL, CKMB, CKMBINDEX, TROPONINI in the last 168 hours. BNP: BNP (last 3 results) No results for input(s): BNP in the last 8760 hours.  ProBNP (last 3 results) No results for input(s): PROBNP in the last 8760 hours.  CBG: No results for input(s): GLUCAP in the last 168 hours.     Signed:  Briant CedarNkeiruka J Kaitlyn Skowron, MD [facility] 01/16/2019, 12:50 PM

## 2019-01-16 NOTE — Progress Notes (Signed)
Her admission phenytoin level was undetectable on the home dose. We repeated again yesterday and it's still undetectable. D/w Dr Horris Latino and we will give her a load and increase the maintenance dose.  Dilantin 750mg  IV x1 Increase Dilantin to 100mg  IV q8 Repeat level in a few days if needed  Onnie Boer, PharmD, BCIDP, AAHIVP, CPP Infectious Disease Pharmacist 01/16/2019 8:54 AM

## 2019-01-16 NOTE — Progress Notes (Signed)
Pt belongings returned to police officers, including patient bags and cell phone. IVC papers given to officers also. Pt IV removed. PRN Valium given to pt at 2015 and nightly dose of dilantin given prior to transport. Pt had no concerns or complaints. Pt ambulated off of unit with no problems.

## 2019-01-16 NOTE — BH Assessment (Signed)
Patient accepted to [facility] for involuntary admission, attending MD [place]. Bed 306-2 available at 2100. Please call report to [phone].

## 2019-01-16 NOTE — Progress Notes (Signed)
Pt accepted to [facility], Bed 305-1  Hampton Abbot, MD,  is the accepting provider.  Neita Garnet, MD is the attending provider.  Call report to  [phone]  Estella Husk., RN Case Manager@ [email or web address] notified.   Pt is IVC   Pt may be transported by [facility] A/C or Adult Unit RN will call when they are ready to admit patient.  Areatha Keas. Judi Cong, MSW, [place] Disposition Clinical Social Work [phone] (cell) [phone] (office)  PLEASE FAX ENTIRE IVC PACKET TO [phone]

## 2019-01-17 ENCOUNTER — Other Ambulatory Visit: Payer: Self-pay

## 2019-01-17 DIAGNOSIS — F1014 Alcohol abuse with alcohol-induced mood disorder: Secondary | ICD-10-CM

## 2019-01-17 DIAGNOSIS — F410 Panic disorder [episodic paroxysmal anxiety] without agoraphobia: Secondary | ICD-10-CM

## 2019-01-17 LAB — BASIC METABOLIC PANEL
Anion gap: 10 (ref 5–15)
BUN: 8 mg/dL (ref 6–20)
CO2: 25 mmol/L (ref 22–32)
Calcium: 9.6 mg/dL (ref 8.9–10.3)
Chloride: 106 mmol/L (ref 98–111)
Creatinine, Ser: 0.55 mg/dL (ref 0.44–1.00)
GFR calc Af Amer: >60 mL/min (ref >60)
GFR calc non Af Amer: >60 mL/min (ref >60)
Glucose, Bld: 96 mg/dL (ref 70–99)
Potassium: 3.5 mmol/L (ref 3.5–5.1)
Sodium: 141 mmol/L (ref 135–145)

## 2019-01-17 MED ORDER — DIAZEPAM 5 MG PO TABS: 5.0000 mg | ORAL_TABLET | ORAL | Status: DC | PRN

## 2019-01-17 NOTE — Tx Team (Signed)
Initial Treatment Plan 01/17/2019 12:30 AM Natasha Pope TRR:[id number]    PATIENT STRESSORS: Financial difficulties Medication change or noncompliance Substance abuse   PATIENT STRENGTHS: Ability for insight Active sense of humor Capable of independent living   PATIENT IDENTIFIED PROBLEMS: Anxiety  Substance abuse  "Medication"  "Withdrawal"               DISCHARGE CRITERIA:  Ability to meet basic life and health needs Adequate post-discharge living arrangements Improved stabilization in mood, thinking, and/or behavior Medical problems require only outpatient monitoring  PRELIMINARY DISCHARGE PLAN: Attend aftercare/continuing care group Attend PHP/IOP Attend 12-step recovery group Outpatient therapy  PATIENT/FAMILY INVOLVEMENT: This treatment plan has been presented to and reviewed with the patient, Natasha Pope, and/or family member.  The patient and family have been given the opportunity to ask questions and make suggestions.  Wolfgang Phoenix, RN 01/17/2019, 12:30 AM

## 2019-01-17 NOTE — BHH Suicide Risk Assessment (Signed)
[facility] Admission Suicide Risk Assessment   Nursing information obtained from:  Patient Demographic factors:  Caucasian, Adolescent or young adult, Unemployed, Gay, lesbian, or bisexual orientation Current Mental Status:  NA Loss Factors:  Financial problems / change in socioeconomic status Historical Factors:  Family history of mental illness or substance abuse Risk Reduction Factors:  Living with another person, especially a relative  Total Time spent with patient: 45 minutes Principal Problem: Alcohol Use Disorder Diagnosis:  Active Problems:   Alcohol abuse with alcohol-induced mood disorder (HCC)   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  World Pharmacologist [facility]). Score between 0-7:  no or low risk or alcohol related problems. Score between 8-15:  moderate risk of alcohol related problems. Score between 16-19:  high risk of alcohol related problems. Score 20 or above:  warrants further diagnostic evaluation for alcohol dependence and treatment.   CLINICAL FACTORS:  23 year old female, history of alcohol use disorder. History of alcohol WDL seizures .Reports long history of anxiety, panic disorder symptoms. Was admitted to medical unit on 6/20 for abdominal symptoms, alcohol dependence. Also reported suicidal ideations, was referred to inpatient psychiatric unit on medical clearance. At this time patient denies any SI.   Psychiatric Specialty Exam: Physical Exam  ROS  Blood pressure 123/90, pulse (!) 107, temperature 98.3 F (36.8 C), temperature source Oral, resp. rate 18, height 5\' 6"  (1.676 m), weight 74.8 kg, SpO2 100 %.Body mass index is 26.63 kg/m.  See admit note MSE   COGNITIVE FEATURES THAT CONTRIBUTE TO RISK:   Closed-mindedness and Loss of executive function    SUICIDE RISK:   Moderate:  Frequent suicidal ideation with limited intensity, and duration, some specificity in terms of plans, no associated intent, good self-control, limited dysphoria/symptomatology, some risk factors present, and identifiable protective factors, including available and accessible social support.  PLAN OF CARE: Patient will be admitted to inpatient psychiatric unit for stabilization and safety. Will provide and encourage milieu participation. Provide medication management and maked adjustments as needed.  Will follow daily.    I certify that inpatient services furnished can reasonably be expected to improve the patient's condition.   Jenne Campus, MD 01/17/2019, 11:08 AM

## 2019-01-17 NOTE — Progress Notes (Signed)
D Pt is observed OOB UAL on the 300 hall today. Initially, she was reluctant to disclose info to this Probation officer...but once she agreed to speak with Probation officer- and she began telling her story, she relaxed more and elaborated on her life. She makes brief eye contact with Probation officer. She is minimal, though in, not acknowledging that she is truly depressed- and even that she initially wanted to die.     A She completed her daily assessment and on this she wrote she deneid having SI today and she rated her depression, hopelessness and anxiety " 0/0/6", respectively.     R Safety in place.

## 2019-01-17 NOTE — BHH Group Notes (Signed)
LCSW Group Therapy Note  01/17/2019   10:00-11:00am   Type of Therapy and Topic:  Group Therapy: Anger Cues and Responses  Participation Level:  Minimal   Description of Group:   In this group, patients learned how to recognize the physical, cognitive, emotional, and behavioral responses they have to anger-provoking situations.  They identified a recent time they became angry and how they reacted.  They analyzed how their reaction was possibly beneficial and how it was possibly unhelpful.  The group discussed a variety of healthier coping skills that could help with such a situation in the future.  Deep breathing was practiced briefly.  Therapeutic Goals: 1. Patients will remember their last incident of anger and how they felt emotionally and physically, what their thoughts were at the time, and how they behaved. 2. Patients will identify how their behavior at that time worked for them, as well as how it worked against them. 3. Patients will explore possible new behaviors to use in future anger situations. 4. Patients will learn that anger itself is normal and cannot be eliminated, and that healthier reactions can assist with resolving conflict rather than worsening situations.  Summary of Patient Progress:  The patient was not able to fully participate due being pull out of group by the doctor at the start of group. Therapeutic Modalities:   Cognitive Behavioral Therapy  Rolanda Jay

## 2019-01-17 NOTE — Progress Notes (Signed)
Natasha Pope is a 23 y.o. female involuntary admitted for suicide ideation. Pt stated she does not know why she is here. Pt stated she had a panic attack and her grandmother drove her to the hospital, but she does not understand why she is here. She stated she is not suicidal and not planning to be. Pt has been cooperative with the admission process, denies SI/HI, AVH at this time. Consents signed, skin/belongings search completed and pt oriented to unit. Pt stable at this time. Pt given the opportunity to express concerns and ask questions. Pt given toiletries. Will continue to monitor.

## 2019-01-17 NOTE — Progress Notes (Signed)
D.  Pt in bed on approach, did not get up for evening wrap up group.  Pt denies SI/HI/AVH at this time.  Pt requested medication for sleep but appeared extremely drowsy.  A.  Scheduled medication given, will monitor in regards to need to request order for sleep medication.  Support and encouragement offered  R.  Pt remains safe on the unit, will continue to monitor.

## 2019-01-17 NOTE — Progress Notes (Signed)
Psychoeducational Group Note  Date:  01/17/2019 Time:  2030 Group Topic/Focus:  wrap up group  Participation Level: Did Not Attend  Participation Quality:  Not Applicable  Affect:  Not Applicable  Cognitive:  Not Applicable  Insight:  Not Applicable  Engagement in Group: Not Applicable  Additional Comments:  Pt was asleep during group time.   Shellia Cleverly 01/17/2019, 9:24 PM

## 2019-01-17 NOTE — H&P (Addendum)
Psychiatric Admission Assessment Adult  Patient Identification: Natasha Pope MRN:  [id number] Date of Evaluation:  01/17/2019 Chief Complaint:  " I have a lot of anxiety, and have panic attacks" Principal Diagnosis: Alcohol Use Disorder, Alcohol Induced Mood Disorder/Anxiety Disorder , Panic Disorder.  Diagnosis:  Alcohol Use Disorder, Alcohol Induced Mood Disorder/Anxiety Disorder , Panic Disorder.  History of Present Illness: 23 year old female, admitted to inpatient medical unit on 6/20. She presented for nausea, vomiting, abdominal discomfort, alcohol use . She also reported suicidal ideations. Admission BAL on 6/20 was 151. UDS was positive for BZDs. Reports indicate several prior ED visits over recent weeks to months related to alcohol use disorder. She has a history of alcohol WDL seizures . She was admitted to inpatient medical unit for management. Was discharged on 6/26, referred to inpatient psychiatric unit.  Currently patient reports her major stressor is anxiety. States she has chronic anxiety, and describes having frequent panic attacks. Currently denies suicidal ideations. Also denies neuro-vegetative symptoms at present. Denies anhedonia, denies pervasive sadness, denies changes in sleep, appetite, energy level.   Associated Signs/Symptoms: Depression Symptoms:  anxiety, does not currently endorse neuro-vegetative symptoms (Hypo) Manic Symptoms:  Denies  Anxiety Symptoms:  reports history of panic attacks, excessive worrying  Psychotic Symptoms:  Denies  PTSD Symptoms: Denies  Total Time spent with patient: 45 minutes  Past Psychiatric History: no history of prior psychiatric admissions . Reports history of depressive episodes which she describes as mild. Denies history of suicide attempts or of self cutting. She reports history of anxiety, and describes history of Panic Attacks/ also endorses an element of Agoraphobia. Denies history of psychosis, denies history of  mania. Denies history of PTSD.   Is the patient at risk to self? Yes.    Has the patient been a risk to self in the past 6 months? No.  Has the patient been a risk to self within the distant past? No.  Is the patient a risk to others? No.  Has the patient been a risk to others in the past 6 months? No.  Has the patient been a risk to others within the distant past? No.   Prior Inpatient Therapy:  denies  Prior Outpatient Therapy:  psychiatric medications were being prescribed by PCP  Alcohol Screening: 1. How often do you have a drink containing alcohol?: 2 to 3 times a week 2. How many drinks containing alcohol do you have on a typical day when you are drinking?: 3 or 4 3. How often do you have six or more drinks on one occasion?: Monthly AUDIT-C Score: 6 4. How often during the last year have you found that you were not able to stop drinking once you had started?: Monthly 5. How often during the last year have you failed to do what was normally expected from you becasue of drinking?: Never 6. How often during the last year have you needed a first drink in the morning to get yourself going after a heavy drinking session?: Monthly 7. How often during the last year have you had a feeling of guilt of remorse after drinking?: Weekly 8. How often during the last year have you been unable to remember what happened the night before because you had been drinking?: Less than monthly 9. Have you or someone else been injured as a result of your drinking?: No 10. Has a relative or friend or a doctor or another health worker been concerned about your drinking or suggested you cut  down?: Yes, during the last year Alcohol Use Disorder Identification Test Final Score (AUDIT): 18 Alcohol Brief Interventions/Follow-up: Brief Advice Substance Abuse History in the last 12 months:  History of alcohol dependence. States she was drinking daily, up to one bottle of liquor and several beers. States she " quit cold  [place]" about 3 weeks ago.  Denies drug abuse  Consequences of Substance Abuse: History of DUI at age 18, reports history of WDL seizures  Previous Psychotropic Medications: Prozac 40 mgrs QDAY x 2 months. States she does not feel that Prozac had helped. States she has not been on other psychiatric medications.  Psychological Evaluations: No  Past Medical History: Reports history of asthma, states it has improved overtime.  Past Medical History:  Diagnosis Date  . Anxiety   . Asthma   . Depression   . Pyelonephritis   . Sepsis [facility])     Past Surgical History:  Procedure Laterality Date  . dental procedure     Family History: parents alive, separated . Has two siblings . Family History  Problem Relation Age of Onset  . Depression Mother   . Miscarriages / [place] Mother   . Hypertension Father   . Alcohol abuse Father   . Learning disabilities Sister   . Hyperlipidemia Maternal Grandmother   . Alcohol abuse Maternal Grandfather   . Alcohol abuse Paternal Grandmother   . Hypertension Paternal Grandfather    Family Psychiatric  History: reports history of depression and anxiety ( mother), history of alcohol use disorder  ( father, paternal grandmother). No suicides in family.  Tobacco Screening: does not smoke or vape Social History: 23, single, no children, lives alone.  Social History   Substance and Sexual Activity  Alcohol Use Yes   Comment: 2-3 days/week     Social History   Substance and Sexual Activity  Drug Use No    Additional Social History:      Pain Medications: See MAR Prescriptions: See MAR Over the Counter: See MAR History of alcohol / drug use?: Yes Negative Consequences of Use: Personal relationships Withdrawal Symptoms: Irritability, Nausea / Vomiting, Tremors  Allergies:   Allergies  Allergen Reactions  . Ativan [Lorazepam] Other (See Comments)    Mental status worsens with benzo, hallucination, agitation   Lab Results:  Results for  orders placed or performed during the hospital encounter of 01/16/19 (from the past 48 hour(s))  Basic metabolic panel     Status: None   Collection Time: 01/17/19  6:29 AM  Result Value Ref Range   Sodium 141 135 - 145 mmol/L   Potassium 3.5 3.5 - 5.1 mmol/L   Chloride 106 98 - 111 mmol/L   CO2 25 22 - 32 mmol/L   Glucose, Bld 96 70 - 99 mg/dL   BUN 8 6 - 20 mg/dL   Creatinine, Ser 4.54 0.44 - 1.00 mg/dL   Calcium 9.6 8.9 - 09.8 mg/dL   GFR calc non Af Amer >60 >60 mL/min   GFR calc Af Amer >60 >60 mL/min   Anion gap 10 5 - 15    Comment: Performed at [facility], [address] [place], [place] [address]    Blood Alcohol level:  Lab Results  Component Value Date   ETH 151 (H) 01/10/2019   ETH <10 12/26/2018    Metabolic Disorder Labs:  No results found for: HGBA1C, MPG No results found for: PROLACTIN Lab Results  Component Value Date   CHOL 164 01/31/2018  TRIG 122.0 01/31/2018   HDL 81.40 01/31/2018   CHOLHDL 2 01/31/2018   VLDL 24.4 01/31/2018   LDLCALC 58 01/31/2018    Current Medications: Current Facility-Administered Medications  Medication Dose Route Frequency Provider Last Rate Last Dose  . acetaminophen (TYLENOL) tablet 650 mg  650 mg Oral Q6H PRN Laveda AbbeParks, Laurie Britton, NP      . alum & mag hydroxide-simeth (MAALOX/MYLANTA) 200-200-20 MG/5ML suspension 30 mL  30 mL Oral Q4H PRN Laveda AbbeParks, Laurie Britton, NP      . amLODipine (NORVASC) tablet 5 mg  5 mg Oral Daily Laveda AbbeParks, Laurie Britton, NP   5 mg at 01/17/19 [phone]  . diazepam (VALIUM) tablet 5 mg  5 mg Oral Q8H Laveda AbbeParks, Laurie Britton, NP   5 mg at 01/17/19 0650  . FLUoxetine (PROZAC) capsule 40 mg  40 mg Oral Daily Laveda AbbeParks, Laurie Britton, NP   40 mg at 01/17/19 [id number]  . folic acid (FOLVITE) tablet 1 mg  1 mg Oral Daily Laveda AbbeParks, Laurie Britton, NP   1 mg at 01/17/19 [id number]  . hydrocortisone cream 0.5 %   Topical BID PRN Laveda AbbeParks, Laurie Britton, NP      . magnesium hydroxide (MILK OF MAGNESIA) suspension  30 mL  30 mL Oral Daily PRN Laveda AbbeParks, Laurie Britton, NP      . multivitamin with minerals tablet 1 tablet  1 tablet Oral Daily Laveda AbbeParks, Laurie Britton, NP   1 tablet at 01/17/19 [phone]  . phenytoin (DILANTIN) ER capsule 100 mg  100 mg Oral TID Laveda AbbeParks, Laurie Britton, NP   100 mg at 01/17/19 [id number]  . promethazine (PHENERGAN) tablet 12.5 mg  12.5 mg Oral Q6H PRN Laveda AbbeParks, Laurie Britton, NP      . thiamine (VITAMIN B-1) tablet 100 mg  100 mg Oral Daily Laveda AbbeParks, Laurie Britton, NP   100 mg at 01/17/19 [id number]   Or  . thiamine (B-1) injection 100 mg  100 mg Intravenous Daily Laveda AbbeParks, Laurie Britton, NP      . traZODone (DESYREL) tablet 50 mg  50 mg Oral QHS PRN Laveda AbbeParks, Laurie Britton, NP   50 mg at 01/16/19 2259   PTA Medications: Medications Prior to Admission  Medication Sig Dispense Refill Last Dose  . alum & mag hydroxide-simeth (MAALOX/MYLANTA) 200-200-20 MG/5ML suspension Take 30 mLs by mouth every 6 (six) hours as needed for indigestion or heartburn. 355 mL 0   . amLODipine (NORVASC) 5 MG tablet Take 1 tablet (5 mg total) by mouth daily.     . diazepam (VALIUM) 5 MG tablet Take 1 tablet (5 mg total) by mouth every 8 (eight) hours. 30 tablet 0   . FLUoxetine (PROZAC) 40 MG capsule Take 1 capsule (40 mg total) by mouth daily. 90 capsule 1   . folic acid (FOLVITE) 1 MG tablet Take 1 tablet (1 mg total) by mouth daily. 90 tablet 3   . ibuprofen (ADVIL) 200 MG tablet Take 1-2 tablets (200-400 mg total) by mouth 3 (three) times daily as needed for headache or moderate pain. 30 tablet 0   . Multiple Vitamins-Minerals (ALIVE ONCE DAILY WOMENS) TABS Take 1 tablet by mouth daily.     . norethindrone-ethinyl estradiol (JUNEL FE,GILDESS FE,LOESTRIN FE) 1-20 MG-MCG tablet Take 1 tablet by mouth daily. 3 Package 2   . phenytoin (DILANTIN) 100 MG ER capsule Take 1 capsule (100 mg total) by mouth 3 (three) times daily.     . Potassium Chloride ER 20 MEQ TBCR Take 20 mEq by mouth daily.  60  tablet    . thiamine 100 MG tablet  Take 1 tablet (100 mg total) by mouth daily.     . traZODone (DESYREL) 50 MG tablet Take 1 tablet (50 mg total) by mouth at bedtime as needed for sleep. 30 tablet 0     Musculoskeletal: Strength & Muscle Tone: within normal limits- mild distal tremors, no diaphoresis,no restlessness or agitation Gait & Station: normal Patient leans: N/A  Psychiatric Specialty Exam: Physical Exam  Review of Systems  Constitutional: Negative for chills and fever.  HENT: Negative.   Eyes: Negative.   Respiratory: Negative for cough and shortness of breath.   Cardiovascular: Negative.  Negative for chest pain.  Gastrointestinal: Negative for nausea and vomiting.  Genitourinary: Negative.   Musculoskeletal: Negative.   Skin: Negative for rash.  Neurological: Positive for seizures and headaches.  Endo/Heme/Allergies: Negative.   Psychiatric/Behavioral: Positive for substance abuse. The patient is nervous/anxious.   All other systems reviewed and are negative.   Blood pressure 123/90, pulse (!) 107, temperature 98.3 F (36.8 C), temperature source Oral, resp. rate 18, height 5\' 6"  (1.676 m), weight 74.8 kg, SpO2 100 %.Body mass index is 26.63 kg/m.  General Appearance: Well Groomed  Eye Contact:  Good  Speech:  Normal Rate  Volume:  Normal  Mood:  reports mood is "OK", minimizes depression at this time  Affect:  Appropriate and reactive  Thought Process:  Linear and Descriptions of Associations: Intact  Orientation:  Full (Time, Place, and Person)  Thought Content:  denies hallucinations, no delusions , not internally preoccupied  Suicidal Thoughts:  No denies suicidal or self injurious ideations, contracts for safety on unit   Homicidal Thoughts:  No  Memory:  recent and remote grossly intact   Judgement:  Fair  Insight:  Fair  Psychomotor Activity:  Normal- mild distal tremors  Concentration:  Concentration: Good and Attention Span: Good  Recall:  Good  Fund of Knowledge:  Good  Language:   Good  Akathisia:  Negative  Handed:  Right  AIMS (if indicated):     Assets:  Communication Skills Desire for Improvement Resilience  ADL's:  Intact  Cognition:  WNL  Sleep:  Number of Hours: 5.75    Treatment Plan Summary: Daily contact with patient to assess and evaluate symptoms and progress in treatment, Medication management, Plan inpatient treatment and medications as below  Observation Level/Precautions:  15 minute checks  Laboratory:  as needed  Phenytoin Serum level in AM, EKG to monitor QTc   Psychotherapy:  Milieu, group therapy   Medications: patient was continued on Valium 5 mgrs Q 8 hours during medical admission. Based on history of complicated withdrawal/seizures, will continue dose for now/taper gradually . Valium 5 mgrs Q 8 hours  Dilantin 100 mgrs TID Amlodipine 5 mgrs QDAY ( BP today 130/90) Patient states Prozac is not helping and does not want to continue this medication trial. Also has had elevated QTc interval. Will discontinue Prozac for now.     Consultations:  As needed   Discharge Concerns:  -   Estimated LOS: 4 days   Other:     Physician Treatment Plan for Primary Diagnosis: Alcohol Use Disorder Long Term Goal(s): Improvement in symptoms so as ready for discharge  Short Term Goals: Ability to identify triggers associated with substance abuse/mental health issues will improve  Physician Treatment Plan for Secondary Diagnosis: Alcohol Induced Mood Disorder, Alcohol Induced Anxiety Disorder versus Panic Disorder  Long Term Goal(s): Improvement in symptoms so as ready  for discharge  Short Term Goals: Ability to identify changes in lifestyle to reduce recurrence of condition will improve, Ability to verbalize feelings will improve, Ability to disclose and discuss suicidal ideas, Ability to demonstrate self-control will improve, Ability to identify and develop effective coping behaviors will improve and Ability to maintain clinical measurements within  normal limits will improve  I certify that inpatient services furnished can reasonably be expected to improve the patient's condition.    Craige CottaFernando A Talik Casique, MD 6/27/202010:24 AM

## 2019-01-17 NOTE — [facility] Group Notes (Signed)
[facility] Group Notes:  (Nursing)  Date:  01/17/2019  Time:200 PM Type of Therapy:  Nurse Education  Participation Level:  Active  Participation Quality:  Appropriate  Affect:  Appropriate  Cognitive:  Appropriate  Insight:  Appropriate  Engagement in Group:  Engaged  Modes of Intervention:  Discussion and Education  Summary of Progress/Problems: Group played a Retail banker game that fosters listening skills as well as self expression. Waymond Cera 01/17/2019, 7:27 PM

## 2019-01-18 DIAGNOSIS — F13288 Sedative, hypnotic or anxiolytic dependence with other sedative, hypnotic or anxiolytic-induced disorder: Secondary | ICD-10-CM

## 2019-01-18 LAB — BASIC METABOLIC PANEL
Anion gap: 11 (ref 5–15)
BUN: 6 mg/dL (ref 6–20)
CO2: 23 mmol/L (ref 22–32)
Calcium: 9.5 mg/dL (ref 8.9–10.3)
Chloride: 105 mmol/L (ref 98–111)
Creatinine, Ser: 0.45 mg/dL (ref 0.44–1.00)
GFR calc Af Amer: >60 mL/min (ref >60)
GFR calc non Af Amer: >60 mL/min (ref >60)
Glucose, Bld: 91 mg/dL (ref 70–99)
Potassium: 3.7 mmol/L (ref 3.5–5.1)
Sodium: 139 mmol/L (ref 135–145)

## 2019-01-18 LAB — PHENYTOIN LEVEL, TOTAL: Phenytoin Lvl: 5.7 ug/mL — ABNORMAL LOW (ref 10.0–20.0)

## 2019-01-18 MED ORDER — DIAZEPAM 5 MG PO TABS
5.0000 mg | ORAL_TABLET | Freq: Every day | ORAL | Status: DC | PRN
  Administered 2019-01-18: 5 mg via ORAL
  Filled 2019-01-18: qty 1

## 2019-01-18 MED ORDER — HYDROXYZINE HCL 10 MG PO TABS
10.0000 mg | ORAL_TABLET | Freq: Three times a day (TID) | ORAL | Status: DC | PRN
  Administered 2019-01-18 – 2019-01-19 (×2): 10 mg via ORAL
  Filled 2019-01-18 (×2): qty 1

## 2019-01-18 MED ORDER — DULOXETINE HCL 20 MG PO CPEP
20.0000 mg | ORAL_CAPSULE | Freq: Every day | ORAL | Status: DC
  Administered 2019-01-18 – 2019-01-19 (×2): 20 mg via ORAL
  Filled 2019-01-18 (×4): qty 1

## 2019-01-18 NOTE — Progress Notes (Addendum)
[facility] MD Progress Note  01/18/2019 2:54 PM Natasha Pope  MRN:  [id number] Subjective:  "I'm anxious."  Natasha Pope socializing in the dayroom. She reports anxiety and repeatedly asks for more Valium on assessment. Patient was advised that we are tapering her off Valium due to history of alcohol abuse. She reports anxiety, tremor, headache but denies other withdrawal symptoms. She has been observed laughing and joking with other patients in the dayroom. Denies SI. Heart rate was recorded as 130 this morning. ECG done this afternoon- NSR with rate 88.  From admission H&P: 23 year old female, admitted to inpatient medical unit on 6/20. She presented for nausea, vomiting, abdominal discomfort, alcohol use . She also reported suicidal ideations. Admission BAL on 6/20 was 151. UDS was positive for BZDs.  Principal Problem: <principal problem not specified> Diagnosis: Active Problems:   Alcohol abuse with alcohol-induced mood disorder (HCC)   Alcohol intoxication with moderate or severe use disorder (HCC)  Total Time spent with patient: 15 minutes  Past Psychiatric History: See admission H&P  Past Medical History:  Past Medical History:  Diagnosis Date  . Anxiety   . Asthma   . Depression   . Pyelonephritis   . Sepsis [facility])     Past Surgical History:  Procedure Laterality Date  . dental procedure     Family History:  Family History  Problem Relation Age of Onset  . Depression Mother   . Miscarriages / [place] Mother   . Hypertension Father   . Alcohol abuse Father   . Learning disabilities Sister   . Hyperlipidemia Maternal Grandmother   . Alcohol abuse Maternal Grandfather   . Alcohol abuse Paternal Grandmother   . Hypertension Paternal Grandfather    Family Psychiatric  History: See admission H&P Social History:  Social History   Substance and Sexual Activity  Alcohol Use Yes   Comment: 2-3 days/week     Social History   Substance and Sexual Activity   Drug Use No    Social History   Socioeconomic History  . Marital status: Single    Spouse name: Not on file  . Number of children: Not on file  . Years of education: Not on file  . Highest education level: Not on file  Occupational History  . Not on file  Social Needs  . Financial resource strain: Not hard at all  . Food insecurity    Worry: Never true    Inability: Never true  . Transportation needs    Medical: No    Non-medical: No  Tobacco Use  . Smoking status: Former Smoker    Packs/day: 0.50    Types: Cigarettes    Quit date: 05/10/2018    Years since quitting: 0.6  . Smokeless tobacco: Never Used  Substance and Sexual Activity  . Alcohol use: Yes    Comment: 2-3 days/week  . Drug use: No  . Sexual activity: Not on file  Lifestyle  . Physical activity    Days per week: 7 days    Minutes per session: 70 min  . Stress: Rather much  Relationships  . Social connections    Talks on phone: More than three times a week    Gets together: Once a week    Attends religious service: Never    Active member of club or organization: No    Attends meetings of clubs or organizations: Never    Relationship status: Never married  Other Topics Concern  . Not on file  Social History Narrative  . Not on file   Additional Social History:    Pain Medications: See MAR Prescriptions: See MAR Over the Counter: See MAR History of alcohol / drug use?: Yes Negative Consequences of Use: Personal relationships Withdrawal Symptoms: Irritability, Nausea / Vomiting, Tremors                    Sleep: Fair  Appetite:  Good  Current Medications: Current Facility-Administered Medications  Medication Dose Route Frequency Provider Last Rate Last Dose  . acetaminophen (TYLENOL) tablet 650 mg  650 mg Oral Q6H PRN Laveda AbbeParks, Laurie Britton, NP      . alum & mag hydroxide-simeth (MAALOX/MYLANTA) 200-200-20 MG/5ML suspension 30 mL  30 mL Oral Q4H PRN Laveda AbbeParks, Laurie Britton, NP       . amLODipine (NORVASC) tablet 5 mg  5 mg Oral Daily Laveda AbbeParks, Laurie Britton, NP   5 mg at 01/18/19 [id number]  . diazepam (VALIUM) tablet 5 mg  5 mg Oral Q8H Laveda AbbeParks, Laurie Britton, NP   5 mg at 01/18/19 1420  . diazepam (VALIUM) tablet 5 mg  5 mg Oral Daily PRN Aldean BakerSykes, Janet E, NP      . hydrocortisone cream 0.5 %   Topical BID PRN Laveda AbbeParks, Laurie Britton, NP      . magnesium hydroxide (MILK OF MAGNESIA) suspension 30 mL  30 mL Oral Daily PRN Laveda AbbeParks, Laurie Britton, NP      . multivitamin with minerals tablet 1 tablet  1 tablet Oral Daily Laveda AbbeParks, Laurie Britton, NP   1 tablet at 01/18/19 [phone]  . phenytoin (DILANTIN) ER capsule 100 mg  100 mg Oral TID Laveda AbbeParks, Laurie Britton, NP   100 mg at 01/18/19 1203  . thiamine (VITAMIN B-1) tablet 100 mg  100 mg Oral Daily Laveda AbbeParks, Laurie Britton, NP   100 mg at 01/18/19 [id number]   Or  . thiamine (B-1) injection 100 mg  100 mg Intravenous Daily Laveda AbbeParks, Laurie Britton, NP        Lab Results:  Results for orders placed or performed during the hospital encounter of 01/16/19 (from the past 48 hour(s))  Basic metabolic panel     Status: None   Collection Time: 01/17/19  6:29 AM  Result Value Ref Range   Sodium 141 135 - 145 mmol/L   Potassium 3.5 3.5 - 5.1 mmol/L   Chloride 106 98 - 111 mmol/L   CO2 25 22 - 32 mmol/L   Glucose, Bld 96 70 - 99 mg/dL   BUN 8 6 - 20 mg/dL   Creatinine, Ser 0.860.55 0.44 - 1.00 mg/dL   Calcium 9.6 8.9 - 57.810.3 mg/dL   GFR calc non Af Amer >60 >60 mL/min   GFR calc Af Amer >60 >60 mL/min   Anion gap 10 5 - 15    Comment: Performed at [facility], [address] [place], [place] [address]  Basic metabolic panel     Status: None   Collection Time: 01/18/19  6:45 AM  Result Value Ref Range   Sodium 139 135 - 145 mmol/L   Potassium 3.7 3.5 - 5.1 mmol/L   Chloride 105 98 - 111 mmol/L   CO2 23 22 - 32 mmol/L   Glucose, Bld 91 70 - 99 mg/dL   BUN 6 6 - 20 mg/dL   Creatinine, Ser 9.520.45 0.44 - 1.00 mg/dL   Calcium 9.5 8.9 - 84.110.3  mg/dL   GFR calc non Af Amer >60 >60 mL/min  GFR calc Af Amer >60 >60 mL/min   Anion gap 11 5 - 15    Comment: Performed at [facility], [address] [place], [place] [address]  Phenytoin level, total     Status: Abnormal   Collection Time: 01/18/19  6:45 AM  Result Value Ref Range   Phenytoin Lvl 5.7 (L) 10.0 - 20.0 ug/mL    Comment: Performed at [facility], [address] [place], [place] [address]    Blood Alcohol level:  Lab Results  Component Value Date   ETH 151 (H) 01/10/2019   ETH <10 12/26/2018    Metabolic Disorder Labs: No results found for: HGBA1C, MPG No results found for: PROLACTIN Lab Results  Component Value Date   CHOL 164 01/31/2018   TRIG 122.0 01/31/2018   HDL 81.40 01/31/2018   CHOLHDL 2 01/31/2018   VLDL 24.4 01/31/2018   LDLCALC 58 01/31/2018    Physical Findings: AIMS: Facial and Oral Movements Muscles of Facial Expression: None, normal Lips and Perioral Area: None, normal Jaw: None, normal Tongue: None, normal,Extremity Movements Upper (arms, wrists, hands, fingers): None, normal Lower (legs, knees, ankles, toes): None, normal, Trunk Movements Neck, shoulders, hips: None, normal, Overall Severity Severity of abnormal movements (highest score from questions above): None, normal Incapacitation due to abnormal movements: None, normal Patient's awareness of abnormal movements (rate only patient's report): No Awareness, Dental Status Current problems with teeth and/or dentures?: No Does patient usually wear dentures?: No  CIWA:  CIWA-Ar Total: 3 COWS:  COWS Total Score: 2  Musculoskeletal: Strength & Muscle Tone: within normal limits Gait & Station: normal Patient leans: N/A  Psychiatric Specialty Exam: Physical Exam  Nursing note and vitals reviewed. Constitutional: She is oriented to person, place, and time. She appears well-developed and well-nourished.  Cardiovascular: Normal rate.   Respiratory: Effort normal.  Neurological: She is alert and oriented to person, place, and time.    Review of Systems  Constitutional: Negative.   Respiratory: Negative for cough and shortness of breath.   Cardiovascular: Negative for chest pain.  Gastrointestinal: Negative for diarrhea, nausea and vomiting.  Neurological: Positive for tremors and headaches. Negative for sensory change.  Psychiatric/Behavioral: Positive for depression and substance abuse. Negative for hallucinations and suicidal ideas. The patient is nervous/anxious. The patient does not have insomnia.     Blood pressure 137/104. Heart rate 88. Respirations 18. Temperature 98.1  General Appearance: Casual  Eye Contact:  Good  Speech:  Normal Rate  Volume:  Normal  Mood:  Anxious  Affect:  Congruent  Thought Process:  Coherent  Orientation:  Full (Time, Place, and Person)  Thought Content:  Logical  Suicidal Thoughts:  No  Homicidal Thoughts:  No  Memory:  Immediate;   Fair Recent;   Fair  Judgement:  Intact  Insight:  Fair  Psychomotor Activity:  Normal  Concentration:  Concentration: Fair  Recall:  Good  Fund of Knowledge:  Fair  Language:  Good  Akathisia:  No  Handed:  Right  AIMS (if indicated):     Assets:  Communication Skills Desire for Improvement Resilience  ADL's:  Intact  Cognition:  WNL  Sleep:  Number of Hours: 5.5     Treatment Plan Summary: Daily contact with patient to assess and evaluate symptoms and progress in treatment and Medication management   Continue inpatient hospitalization.  Start Cymbalta 20 mg PO daily for anxiety/depression Start Vistaril 10 mg PO TID PRN anxiety Continue Valium 5 mg PO Q8HR,  daily PRN CIWA>10 for withdrawal Continue Dilantin 100 mg PO TID for seizure disorder Continue thiamine 100 mg PO daily for supplementation Continue Norvasc 5 mg PO daily for HTN  Patient will participate in the therapeutic group milieu.  Discharge disposition in  progress.   Aldean BakerJanet E Sykes, NP 01/18/2019, 2:54 PM   Agree with NP Progress Note
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
## 2019-01-18 NOTE — Progress Notes (Signed)
[place] NOVEL CORONAVIRUS (COVID-19) DAILY CHECK-OFF SYMPTOMS - answer yes or no to each - every day NO YES  Have you had a fever in the past 24 hours?  . Fever (Temp > 37.80C / 100F) X   Have you had any of these symptoms in the past 24 hours? . New Cough .  Sore Throat  .  Shortness of Breath .  Difficulty Breathing .  Unexplained Body Aches   X   Have you had any one of these symptoms in the past 24 hours not related to allergies?   . Runny Nose .  Nasal Congestion .  Sneezing   X   If you have had runny nose, nasal congestion, sneezing in the past 24 hours, has it worsened?  X   EXPOSURES - check yes or no X   Have you traveled outside the state in the past 14 days?  X   Have you been in contact with someone with a confirmed diagnosis of COVID-19 or PUI in the past 14 days without wearing appropriate PPE?  X   Have you been living in the same home as a person with confirmed diagnosis of COVID-19 or a PUI (household contact)?    X   Have you been diagnosed with COVID-19?    X              What to do next: Answered NO to all: Answered YES to anything:   Proceed with unit schedule Follow the [facility] Inpatient Flowsheet.   

## 2019-01-18 NOTE — Progress Notes (Signed)
D Pt is observed OOB UAL on the 300 hall this am. She speaks in a loud, irritable voice. HSe is welll dressed in her own clothes. She endorses a flat, depressed demeanor.     A She completed her daily assessment and on this she wrote she denied having SI today and she rated her dperessiion, hopelessness anda xneity " 2/0/8", respectively. She is concerned about the scheudling of her valiuma dn practitioner is notidfied by this Probation officer.  '   R Safety in place.

## 2019-01-18 NOTE — Progress Notes (Addendum)
D   Pt is labile and tearful on the phone with her grandmother   She is drug seeking and demanding   She endorses depression and anxiety and she said her stomach hurts A   Verbal support given   Medications administered and effectiveness monitored   Q 15 min checks R   Pt remains safe at this time  [name] CORONAVIRUS (COVID-19) DAILY CHECK-OFF SYMPTOMS - answer yes or no to each - every day NO YES  Have you had a fever in the past 24 hours?  . Fever (Temp > 37.80C / 100F) X   Have you had any of these symptoms in the past 24 hours? . New Cough .  Sore Throat  .  Shortness of Breath .  Difficulty Breathing .  Unexplained Body Aches   X   Have you had any one of these symptoms in the past 24 hours not related to allergies?   . Runny Nose .  Nasal Congestion .  Sneezing   X   If you have had runny nose, nasal congestion, sneezing in the past 24 hours, has it worsened?  X   EXPOSURES - check yes or no X   Have you traveled outside the state in the past 14 days?  X   Have you been in contact with someone with a confirmed diagnosis of COVID-19 or PUI in the past 14 days without wearing appropriate PPE?  X   Have you been living in the same home as a person with confirmed diagnosis of COVID-19 or a PUI (household contact)?    X   Have you been diagnosed with COVID-19?    X              What to do next: Answered NO to all: Answered YES to anything:   Proceed with unit schedule Follow the [facility] Inpatient Flowsheet.

## 2019-01-18 NOTE — [facility] Group Notes (Signed)
[facility] LCSW Group Therapy Note  Date/Time:  01/18/2019 9:00-10:00 or 10:00-11:00AM  Type of Therapy and Topic:  Group Therapy:  Healthy and Unhealthy Supports  Participation Level:  Active   Description of Group:  Patients in this group were introduced to the idea of adding a variety of healthy supports to address the various needs in their lives.Patients discussed what additional healthy supports could be helpful in their recovery and wellness after discharge in order to prevent future hospitalizations.   An emphasis was placed on using counselor, doctor, therapy groups, 12-step groups, and problem-specific support groups to expand supports.  They also worked as a group on developing a specific plan for several patients to deal with unhealthy supports through [place], psychoeducation with loved ones, and even termination of relationships.   Therapeutic Goals:   1)  discuss importance of adding supports to stay well once out of the hospital  2)  compare healthy versus unhealthy supports and identify some examples of each  3)  generate ideas and descriptions of healthy supports that can be added  4)  offer mutual support about how to address unhealthy supports  5)  encourage active participation in and adherence to discharge plan    Summary of Patient Progress:  The patient stated that current healthy supports in her  life are her grandmother while current unhealthy supports include her parents and old friends.  The patient expressed a willingness to add counseling to learn how to cope with past trauma as supports to help in her recovery journey.   Therapeutic Modalities:   Motivational Interviewing Brief Solution-Focused Therapy  Rolanda Jay

## 2019-01-19 DIAGNOSIS — F10229 Alcohol dependence with intoxication, unspecified: Secondary | ICD-10-CM

## 2019-01-19 LAB — BASIC METABOLIC PANEL
Anion gap: 10 (ref 5–15)
BUN: 5 mg/dL — ABNORMAL LOW (ref 6–20)
CO2: 24 mmol/L (ref 22–32)
Calcium: 9.7 mg/dL (ref 8.9–10.3)
Chloride: 106 mmol/L (ref 98–111)
Creatinine, Ser: 0.48 mg/dL (ref 0.44–1.00)
GFR calc Af Amer: >60 mL/min (ref >60)
GFR calc non Af Amer: >60 mL/min (ref >60)
Glucose, Bld: 95 mg/dL (ref 70–99)
Potassium: 3.8 mmol/L (ref 3.5–5.1)
Sodium: 140 mmol/L (ref 135–145)

## 2019-01-19 MED ORDER — HYDROXYZINE HCL 25 MG PO TABS
25.0000 mg | ORAL_TABLET | Freq: Three times a day (TID) | ORAL | Status: DC | PRN
  Administered 2019-01-19 – 2019-01-20 (×4): 25 mg via ORAL
  Filled 2019-01-19 (×3): qty 1

## 2019-01-19 MED ORDER — TRAZODONE HCL 100 MG PO TABS
200.0000 mg | ORAL_TABLET | Freq: Every day | ORAL | Status: DC
  Administered 2019-01-19: 200 mg via ORAL
  Filled 2019-01-19 (×4): qty 2

## 2019-01-19 MED ORDER — PRIMIDONE 50 MG PO TABS
100.0000 mg | ORAL_TABLET | Freq: Three times a day (TID) | ORAL | Status: AC
  Administered 2019-01-19 (×2): 100 mg via ORAL
  Filled 2019-01-19 (×4): qty 2

## 2019-01-19 MED ORDER — METHOCARBAMOL 750 MG PO TABS
750.0000 mg | ORAL_TABLET | Freq: Three times a day (TID) | ORAL | Status: DC | PRN
  Administered 2019-01-19 (×2): 750 mg via ORAL
  Filled 2019-01-19 (×3): qty 1

## 2019-01-19 MED ORDER — AMLODIPINE BESYLATE 10 MG PO TABS
10.0000 mg | ORAL_TABLET | Freq: Every day | ORAL | Status: DC
  Administered 2019-01-20: 10 mg via ORAL
  Filled 2019-01-19 (×3): qty 1

## 2019-01-19 MED ORDER — HYDROXYZINE HCL 25 MG PO TABS
ORAL_TABLET | ORAL | Status: AC
  Administered 2019-01-19: 25 mg via ORAL
  Filled 2019-01-19: qty 1

## 2019-01-19 MED ORDER — TOPIRAMATE 25 MG PO TABS
25.0000 mg | ORAL_TABLET | Freq: Two times a day (BID) | ORAL | Status: DC
  Administered 2019-01-19 – 2019-01-20 (×3): 25 mg via ORAL
  Filled 2019-01-19 (×7): qty 1

## 2019-01-19 MED ORDER — DULOXETINE HCL 30 MG PO CPEP
30.0000 mg | ORAL_CAPSULE | Freq: Every day | ORAL | Status: DC
  Administered 2019-01-20: 30 mg via ORAL
  Filled 2019-01-19 (×3): qty 1

## 2019-01-19 NOTE — Progress Notes (Addendum)
Spiritual care group on grief and loss facilitated by chaplain Jerene Pitch  Group Goal:  Support / Education around grief and loss Members engage in facilitated group support and psycho-social education.  Group Description:  Following introductions and group rules, group members engaged in facilitated group dialog and support around topic of loss, with particular support around experiences of loss in their lives. Group Identified types of loss (relationships / self / things) and identified patterns, circumstances, and changes that precipitate losses. Reflected on thoughts / feelings around loss, normalized grief responses, and recognized variety in grief experience. Patient Progress: Present at beginning of group.  She became tearful as another group member was speaking and left group room.  Did not return.

## 2019-01-19 NOTE — Progress Notes (Signed)
[facility] MD Progress Note  01/19/2019 8:05 AM Natasha Pope  MRN:  [id number] Subjective:   Patient is initially irritable requesting an immediate interview stating she has anxiety and is medication seeking, asking for more diazepam.  Stating she has tremors. States she is an alcoholic who had "for seizures" at home she presented with a blood alcohol level of 151 drug screen showing benzodiazepines.  She was admitted to the medical floor in 6/20 States she has been here 3 days states she is still anxious is very focused on obtaining medications for anxiety. 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 Anxiety   . Asthma   . Depression   . Pyelonephritis   . Sepsis [facility])     Past Surgical History:  Procedure Laterality Date  . dental procedure     Family History:  Family History  Problem Relation Age of Onset  . Depression Mother   . Miscarriages / [place] Mother   . Hypertension Father   . Alcohol abuse Father   . Learning disabilities Sister   . Hyperlipidemia Maternal Grandmother   . Alcohol abuse Maternal Grandfather   . Alcohol abuse Paternal Grandmother   . Hypertension Paternal Grandfather    Family Psychiatric  History: see eval Social History:  Social History   Substance and Sexual Activity  Alcohol Use Yes   Comment: 2-3 days/week     Social History   Substance and Sexual Activity  Drug Use No    Social History   Socioeconomic History  . Marital status: Single    Spouse name: Not on file  . Number of children: Not on file  . Years of education: Not on file  . Highest education level: Not on file  Occupational History  . Not on file  Social Needs  . Financial resource strain: Not hard  at all  . Food insecurity    Worry: Never true    Inability: Never true  . Transportation needs    Medical: No    Non-medical: No  Tobacco Use  . Smoking status: Former Smoker    Packs/day: 0.50    Types: Cigarettes    Quit date: 05/10/2018    Years since quitting: 0.6  . Smokeless tobacco: Never Used  Substance and Sexual Activity  . Alcohol use: Yes    Comment: 2-3 days/week  . Drug use: No  . Sexual activity: Not on file  Lifestyle  . Physical activity    Days per week: 7 days    Minutes per session: 70 min  . Stress: Rather much  Relationships  . Social connections    Talks on phone: More than three times a week    Gets together: Once a week    Attends religious service: Never    Active member of club or organization: No    Attends meetings of clubs or organizations: Never    Relationship status: Never married  Other Topics Concern  . Not on file  Social History Narrative  . Not on file   Additional Social History:    Pain Medications: See MAR Prescriptions: See MAR Over the Counter: See MAR History of alcohol / drug use?: Yes Negative Consequences of Use: Personal relationships Withdrawal Symptoms: Irritability, Nausea /  Vomiting, Tremors                    Sleep: Fair  Appetite:  Fair  Current Medications: Current Facility-Administered Medications  Medication Dose Route Frequency Provider Last Rate Last Dose  . acetaminophen (TYLENOL) tablet 650 mg  650 mg Oral Q6H PRN Laveda AbbeParks, Laurie Britton, NP      . alum & mag hydroxide-simeth (MAALOX/MYLANTA) 200-200-20 MG/5ML suspension 30 mL  30 mL Oral Q4H PRN Laveda AbbeParks, Laurie Britton, NP      . amLODipine (NORVASC) tablet 5 mg  5 mg Oral Daily Laveda AbbeParks, Laurie Britton, NP   5 mg at 01/18/19 [id number]  . [START ON 01/20/2019] DULoxetine (CYMBALTA) DR capsule 30 mg  30 mg Oral Daily Malvin JohnsFarah, Tifany Hirsch, MD      . hydrocortisone cream 0.5 %   Topical BID PRN Laveda AbbeParks, Laurie Britton, NP      . hydrOXYzine (ATARAX/VISTARIL)  tablet 10 mg  10 mg Oral TID PRN Aldean BakerSykes, Janet E, NP   10 mg at 01/18/19 1710  . magnesium hydroxide (MILK OF MAGNESIA) suspension 30 mL  30 mL Oral Daily PRN Laveda AbbeParks, Laurie Britton, NP      . multivitamin with minerals tablet 1 tablet  1 tablet Oral Daily Laveda AbbeParks, Laurie Britton, NP   1 tablet at 01/18/19 [phone]  . phenytoin (DILANTIN) ER capsule 100 mg  100 mg Oral TID Laveda AbbeParks, Laurie Britton, NP   100 mg at 01/18/19 1710  . primidone (MYSOLINE) tablet 100 mg  100 mg Oral TID Malvin JohnsFarah, Evalyse Stroope, MD      . thiamine (VITAMIN B-1) tablet 100 mg  100 mg Oral Daily Laveda AbbeParks, Laurie Britton, NP   100 mg at 01/18/19 [id number]   Or  . thiamine (B-1) injection 100 mg  100 mg Intravenous Daily Laveda AbbeParks, Laurie Britton, NP      . topiramate (TOPAMAX) tablet 25 mg  25 mg Oral BID Malvin JohnsFarah, Deliliah Spranger, MD      . traZODone (DESYREL) tablet 200 mg  200 mg Oral QHS Malvin JohnsFarah, Jaye Saal, MD        Lab Results:  Results for orders placed or performed during the hospital encounter of 01/16/19 (from the past 48 hour(s))  Basic metabolic panel     Status: None   Collection Time: 01/18/19  6:45 AM  Result Value Ref Range   Sodium 139 135 - 145 mmol/L   Potassium 3.7 3.5 - 5.1 mmol/L   Chloride 105 98 - 111 mmol/L   CO2 23 22 - 32 mmol/L   Glucose, Bld 91 70 - 99 mg/dL   BUN 6 6 - 20 mg/dL   Creatinine, Ser 8.110.45 0.44 - 1.00 mg/dL   Calcium 9.5 8.9 - 91.410.3 mg/dL   GFR calc non Af Amer >60 >60 mL/min   GFR calc Af Amer >60 >60 mL/min   Anion gap 11 5 - 15    Comment: Performed at [facility], [address] [place], [place] [address]  Phenytoin level, total     Status: Abnormal   Collection Time: 01/18/19  6:45 AM  Result Value Ref Range   Phenytoin Lvl 5.7 (L) 10.0 - 20.0 ug/mL    Comment: Performed at [facility], [address] [place], [place] [address]  Basic metabolic panel     Status: Abnormal   Collection Time: 01/19/19  6:40 AM  Result Value Ref Range   Sodium 140 135 - 145 mmol/L   Potassium  3.8 3.5 -  5.1 mmol/L   Chloride 106 98 - 111 mmol/L   CO2 24 22 - 32 mmol/L   Glucose, Bld 95 70 - 99 mg/dL   BUN 5 (L) 6 - 20 mg/dL   Creatinine, Ser 1.610.48 0.44 - 1.00 mg/dL   Calcium 9.7 8.9 - 09.610.3 mg/dL   GFR calc non Af Amer >60 >60 mL/min   GFR calc Af Amer >60 >60 mL/min   Anion gap 10 5 - 15    Comment: Performed at [facility], [address] [place], [place] [address]    Blood Alcohol level:  Lab Results  Component Value Date   ETH 151 (H) 01/10/2019   ETH <10 12/26/2018    Metabolic Disorder Labs: No results found for: HGBA1C, MPG No results found for: PROLACTIN Lab Results  Component Value Date   CHOL 164 01/31/2018   TRIG 122.0 01/31/2018   HDL 81.40 01/31/2018   CHOLHDL 2 01/31/2018   VLDL 24.4 01/31/2018   LDLCALC 58 01/31/2018    Physical Findings: AIMS: Facial and Oral Movements Muscles of Facial Expression: None, normal Lips and Perioral Area: None, normal Jaw: None, normal Tongue: None, normal,Extremity Movements Upper (arms, wrists, hands, fingers): None, normal Lower (legs, knees, ankles, toes): None, normal, Trunk Movements Neck, shoulders, hips: None, normal, Overall Severity Severity of abnormal movements (highest score from questions above): None, normal Incapacitation due to abnormal movements: None, normal Patient's awareness of abnormal movements (rate only patient's report): No Awareness, Dental Status Current problems with teeth and/or dentures?: No Does patient usually wear dentures?: No  CIWA:  CIWA-Ar Total: 3 COWS:  COWS Total Score: 2  Musculoskeletal: Strength & Muscle Tone: within normal limits Gait & Station: normal Patient leans: N/A  Psychiatric Specialty Exam: Physical Exam  ROS  Blood pressure (!) 128/112, pulse (!) 144, temperature 98 F (36.7 C), temperature source Oral, resp. rate 18, height 5\' 6"  (1.676 m), weight 74.8 kg, SpO2 100 %.Body mass index is 26.63 kg/m.  General Appearance: Casual   Eye Contact:  Fair  Speech:  Clear and Coherent  Volume:  Normal  Mood:  Irritable  Affect:  congruent  Thought Process:  Coherent and Descriptions of Associations: Tangential  Orientation:  Full (Time, Place, and Person)  Thought Content:  Logical and Rumination  Suicidal Thoughts:  no  Homicidal Thoughts:  No  Memory:  Immediate;   Good  Judgement:  Fair  Insight:  Good  Psychomotor Activity:  Normal  Concentration:  Concentration: Fair  Recall:  Good  Fund of Knowledge:  Good  Language:  Good  Akathisia:  Negative  Handed:  Right  AIMS (if indicated):     Assets:  Leisure Time Physical Health  ADL's:  Intact  Cognition:  WNL  Sleep:  Number of Hours: 6     Treatment Plan Summary: Daily contact with patient to assess and evaluate symptoms and progress in treatment and Medication management continue current monitoring continue anticonvulsant but add Mysoline for complaints of tremor and protracted withdrawal risks, add topiramate for cravings, escalate Cymbalta and escalate trazodone at her request.  Malvin JohnsFARAH,Hermen Mario, MD 01/19/2019, 8:05 AM

## 2019-01-19 NOTE — Progress Notes (Signed)
Patient ID: Natasha Pope, female   DOB: 26-Apr-1996, 23 y.o.   MRN: [id number] Pt has been intrusive, attention seeking, needy and demanding. Pt med seeking, frequently hanging around desk and medication window to ask writer about Dr ordering new medications. Pt has been labile in mood crying at times, animated others. Pt is positive for groups with prompting but doesn't always stay in the group. Redirection, limit setting as needed. Med ed reinforced.  Safety maintained.

## 2019-01-19 NOTE — Progress Notes (Signed)
Recreation Therapy Notes  Date:  6.29.20 Time: 0930 Location: 300 Hall Dayroom  Group Topic: Stress Management  Goal Area(s) Addresses:  Patient will identify positive stress management techniques. Patient will identify benefits of using stress management post d/c.  Intervention: Stress Management  Activity :  Meditation.  LRT introduced the stress management technique of meditation.  LRT played a meditation that focused on taking on the stillness of the mountain.  Education:  Stress Management, Discharge Planning.   Education Outcome: Acknowledges Education  Clinical Observations/Feedback:  Pt did not attend group.    Victorino Sparrow, LRT/CTRS         Victorino Sparrow A 01/19/2019 10:51 AM

## 2019-01-19 NOTE — [facility] Suicide Risk Assessment (Signed)
[place] INPATIENT:  Family/Significant Other Suicide Prevention Education  Suicide Prevention Education:  Education Completed; Natasha Pope, grandmother, [phone],  has been identified by the patient as the family member/significant other with whom the patient will be residing, and identified as the person(s) who will aid the patient in the event of a mental health crisis (suicidal ideations/suicide attempt).  With written consent from the patient, the family member/significant other has been provided the following suicide prevention education, prior to the and/or following the discharge of the patient.  The suicide prevention education provided includes the following:  Suicide risk factors  Suicide prevention and interventions  National Suicide Hotline telephone number  [facility] assessment telephone number  [facility] Emergency Assistance [place] and/or Residential Mobile Crisis Unit telephone number  Request made of family/significant other to:  Remove weapons (e.g., guns, rifles, knives), all items previously/currently identified as safety concern.  No guns, per grandmother.  Remove drugs/medications (over-the-counter, prescriptions, illicit drugs), all items previously/currently identified as a safety concern.  The family member/significant other verbalizes understanding of the suicide prevention education information provided.  The family member/significant other agrees to remove the items of safety concern listed above. Cory Roughen called to say that family has arranged admission to residential treatment program in [place], [place].  Pt has not yet agreed to go.  Grandmother has been trying to help pt but pt has not been following through on commitments.  Whole family trying to help.       Joanne Chars, LCSW 01/19/2019, 1:44 PM

## 2019-01-19 NOTE — Tx Team (Signed)
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 acetaminophen (TYLENOL) tablet 650 mg  650 mg Oral Q6H PRN Laveda AbbeParks, Laurie Britton, NP      . alum & mag hydroxide-simeth (MAALOX/MYLANTA) 200-200-20 MG/5ML suspension 30 mL  30 mL Oral Q4H PRN Laveda AbbeParks, Laurie Britton, NP      . Melene Muller[START ON 01/20/2019] amLODipine (NORVASC) tablet 10 mg  10 mg Oral Daily Malvin JohnsFarah, Brian, MD      . Melene Muller[START ON 01/20/2019] DULoxetine (CYMBALTA) DR capsule 30 mg  30 mg Oral Daily Malvin JohnsFarah, Brian, MD      . hydrocortisone cream 0.5 %   Topical BID PRN Laveda AbbeParks, Laurie Britton, NP      . hydrOXYzine (ATARAX/VISTARIL) tablet 25 mg  25 mg Oral TID PRN Cobos, Rockey SituFernando A, MD   25 mg at 01/19/19 1140  . magnesium hydroxide (MILK OF MAGNESIA) suspension 30 mL  30 mL Oral Daily PRN Laveda AbbeParks, Laurie Britton, NP      . multivitamin with minerals tablet 1 tablet  1 tablet Oral Daily Laveda AbbeParks, Laurie Britton, NP   1 tablet at 01/19/19 [id number]  . phenytoin (DILANTIN) ER capsule 100 mg  100 mg Oral TID Laveda AbbeParks, Laurie Britton, NP   100 mg at 01/19/19 1202  . primidone (MYSOLINE) tablet 100 mg  100 mg Oral TID Malvin JohnsFarah, Brian, MD   100 mg at 01/19/19 1106  . thiamine (VITAMIN B-1) tablet 100 mg  100 mg Oral Daily Laveda AbbeParks, Laurie Britton, NP   100 mg at 01/19/19 [id number]   Or  . thiamine (B-1) injection 100 mg  100 mg Intravenous Daily Laveda AbbeParks, Laurie Britton, NP      . topiramate (TOPAMAX) tablet 25 mg  25 mg Oral BID Malvin JohnsFarah, Brian, MD   25 mg at 01/19/19 0815  . traZODone (DESYREL) tablet 200 mg  200 mg Oral QHS Malvin JohnsFarah, Brian, MD       PTA  Medications: Medications Prior to Admission  Medication Sig Dispense Refill Last Dose  . alum & mag hydroxide-simeth (MAALOX/MYLANTA) 200-200-20 MG/5ML suspension Take 30 mLs by mouth every 6 (six) hours as needed for indigestion or heartburn. 355 mL 0   . amLODipine (NORVASC) 5 MG tablet Take 1 tablet (5 mg total) by mouth daily.     . diazepam (VALIUM) 5 MG tablet Take 1 tablet (5 mg total) by mouth every 8 (eight) hours. 30 tablet 0   . FLUoxetine (PROZAC) 40 MG capsule Take 1 capsule (40 mg total) by mouth daily. 90 capsule 1   . folic acid (FOLVITE) 1 MG tablet Take 1 tablet (1 mg total) by mouth daily. 90 tablet 3   . ibuprofen (ADVIL) 200 MG tablet Take 1-2 tablets (200-400 mg total) by mouth 3 (three) times daily as needed for headache or moderate pain. 30 tablet 0   . Multiple Vitamins-Minerals (ALIVE ONCE DAILY WOMENS) TABS Take 1 tablet by mouth daily.     . norethindrone-ethinyl estradiol (JUNEL FE,GILDESS FE,LOESTRIN FE) 1-20 MG-MCG tablet Take 1 tablet by mouth daily. 3 Package 2   . phenytoin (DILANTIN) 100 MG ER capsule Take 1 capsule (100 mg total) by mouth 3 (three) times  daily.     . Potassium Chloride ER 20 MEQ TBCR Take 20 mEq by mouth daily.  60 tablet    . thiamine 100 MG tablet Take 1 tablet (100 mg total) by mouth daily.     . traZODone (DESYREL) 50 MG tablet Take 1 tablet (50 mg total) by mouth at bedtime as needed for sleep. 30 tablet 0     Patient Stressors: Financial difficulties Medication change or noncompliance Substance abuse  Patient Strengths: Ability for insight Active sense of humor Capable of independent living  Treatment Modalities: Medication Management, Group therapy, Case management,  1 to 1 session with clinician, Psychoeducation, Recreational therapy.   Physician Treatment Plan for Primary Diagnosis: <principal problem not specified> Long Term Goal(s): Improvement in symptoms so as ready for discharge Improvement in symptoms so as ready for  discharge   Short Term Goals: Ability to identify triggers associated with substance abuse/mental health issues will improve Ability to identify changes in lifestyle to reduce recurrence of condition will improve Ability to verbalize feelings will improve Ability to disclose and discuss suicidal ideas Ability to demonstrate self-control will improve Ability to identify and develop effective coping behaviors will improve Ability to maintain clinical measurements within normal limits will improve  Medication Management: Evaluate patient's response, side effects, and tolerance of medication regimen.  Therapeutic Interventions: 1 to 1 sessions, Unit Group sessions and Medication administration.  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)  Long Term Goal(s): Improvement in symptoms so as ready for discharge Improvement in symptoms so as ready for discharge   Short Term Goals: Ability to identify triggers associated with substance abuse/mental health issues will improve Ability to identify changes in lifestyle to reduce recurrence of condition will improve Ability to verbalize feelings will improve Ability to disclose and discuss suicidal ideas Ability to demonstrate self-control will improve Ability to identify and develop effective coping behaviors will improve Ability to maintain clinical measurements within normal limits will improve     Medication Management: Evaluate patient's response, side effects, and tolerance of medication regimen.  Therapeutic Interventions: 1 to 1 sessions, Unit Group sessions and Medication administration.  Evaluation of Outcomes: Progressing   RN Treatment Plan for Primary Diagnosis: <principal problem not specified> Long Term Goal(s): Knowledge of disease and therapeutic  regimen to maintain health will improve  Short Term Goals: Ability to identify and develop effective coping behaviors will improve and Compliance with prescribed medications will improve  Medication Management: RN will administer medications as ordered by provider, will assess and evaluate patient's response and provide education to patient for prescribed medication. RN will report any adverse and/or side effects to prescribing provider.  Therapeutic Interventions: 1 on 1 counseling sessions, Psychoeducation, Medication administration, Evaluate responses to treatment, Monitor vital signs and CBGs as ordered, Perform/monitor CIWA, COWS, AIMS and Fall Risk screenings as ordered, Perform wound care treatments as ordered.  Evaluation of Outcomes: Progressing   LCSW Treatment Plan for Primary Diagnosis: <principal problem not specified> Long Term Goal(s): Safe transition to appropriate next level of care at discharge, Engage patient in therapeutic group addressing interpersonal concerns.  Short Term Goals: Engage patient in aftercare planning with referrals and resources, Increase social support and Increase skills for wellness and recovery  Therapeutic Interventions: Assess for all discharge needs, 1 to 1 time with Social worker, Explore available resources and support systems,  Assess for adequacy in community support network, Educate family and significant other(s) on suicide prevention, Complete Psychosocial Assessment, Interpersonal group therapy.  Evaluation of Outcomes: Progressing   Progress in Treatment: Attending groups: Yes. Participating in groups: Yes. Taking medication as prescribed: Yes. Toleration medication: Yes. Family/Significant other contact made: No, will contact:  grandmother Patient understands diagnosis: Yes. Discussing patient identified problems/goals with staff: Yes. Medical problems stabilized or resolved: Yes. Denies suicidal/homicidal ideation:  Yes. Issues/concerns per patient self-inventory: No. Other: none  New problem(s) identified: No, Describe:  none  New Short Term/Long Term Goal(s):  Patient Goals:    Discharge Plan or Barriers:   Reason for Continuation of Hospitalization: Depression Medication stabilization Withdrawal symptoms  Estimated Length of Stay: 3-5 days  Attendees: Patient: 01/19/2019   Physician: Dr. Jake Samples, MD 01/19/2019   Nursing:  01/19/2019   RN Care Manager: Grayland Ormond, RN 01/19/2019   Social Worker: Lurline Idol, LCSW 01/19/2019   Recreational Therapist:  01/19/2019   Other:  01/19/2019   Other:  01/19/2019   Other: 01/19/2019        Scribe for Treatment Team: Joanne Chars, [place] 01/19/2019 12:22 PM

## 2019-01-20 ENCOUNTER — Telehealth: Payer: 59 | Admitting: Neurology

## 2019-01-20 LAB — BASIC METABOLIC PANEL
Anion gap: 7 (ref 5–15)
BUN: 5 mg/dL — ABNORMAL LOW (ref 6–20)
CO2: 26 mmol/L (ref 22–32)
Calcium: 9.6 mg/dL (ref 8.9–10.3)
Chloride: 105 mmol/L (ref 98–111)
Creatinine, Ser: 0.55 mg/dL (ref 0.44–1.00)
GFR calc Af Amer: >60 mL/min (ref >60)
GFR calc non Af Amer: >60 mL/min (ref >60)
Glucose, Bld: 83 mg/dL (ref 70–99)
Potassium: 3.6 mmol/L (ref 3.5–5.1)
Sodium: 138 mmol/L (ref 135–145)

## 2019-01-20 MED ORDER — HYDROXYZINE HCL 25 MG PO TABS: 25.0000 mg | ORAL_TABLET | Freq: Three times a day (TID) | ORAL | 1 refills | Status: DC | PRN

## 2019-01-20 MED ORDER — TOPIRAMATE 50 MG PO TABS: 50.0000 mg | ORAL_TABLET | Freq: Three times a day (TID) | ORAL | 2 refills | Status: DC

## 2019-01-20 MED ORDER — TRAZODONE HCL 100 MG PO TABS: 200.0000 mg | ORAL_TABLET | Freq: Every day | ORAL | 2 refills | Status: DC

## 2019-01-20 MED ORDER — AMLODIPINE BESYLATE 10 MG PO TABS: 10.0000 mg | ORAL_TABLET | Freq: Every day | ORAL | 1 refills | Status: DC

## 2019-01-20 MED ORDER — DULOXETINE HCL 60 MG PO CPEP: 60.0000 mg | ORAL_CAPSULE | Freq: Every day | ORAL | 2 refills | Status: DC

## 2019-01-20 MED ORDER — PHENYTOIN SODIUM EXTENDED 100 MG PO CAPS: 100.0000 mg | ORAL_CAPSULE | Freq: Three times a day (TID) | ORAL | 2 refills | Status: DC

## 2019-01-20 MED ORDER — HYDROXYZINE HCL 50 MG PO TABS: 50.0000 mg | ORAL_TABLET | Freq: Three times a day (TID) | ORAL | 0 refills | Status: DC | PRN

## 2019-01-20 MED ORDER — METHOCARBAMOL 750 MG PO TABS: 750.0000 mg | ORAL_TABLET | Freq: Three times a day (TID) | ORAL | 0 refills | Status: AC

## 2019-01-20 NOTE — Progress Notes (Signed)
  [facility] Adult Case Management Discharge Plan :  Will you be returning to the same living situation after discharge:  Yes,  home At discharge, do you have transportation home?: Yes,  grandmother will pick up Do you have the ability to pay for your medications: Yes,  [employer]  Release of information consent forms completed and in the chart;  Work letters on chart.  Patient to Follow up at: Follow-up Information    [facility] Follow up on 01/29/2019.   Specialty: Behavioral Health Why: Medication management appointment with Dr. Mamie Nick is Thursday, 7/9 at 1:00p.  Therapy appointment with Rollene Fare is Friday, 7/10 at 9:00a.  Appointments will be done virtually through [facility] and a link will be emailed to you.  Contact information: [place] [phone]          Next level of care provider has access to [place] and Suicide Prevention discussed: Yes,  with grandmother  Have you used any form of tobacco in the last 30 days? (Cigarettes, Smokeless Tobacco, Cigars, and/or Pipes): Yes  Has patient been referred to the Quitline?: Patient refused referral  Patient has been referred for addiction treatment: Pt. refused referral  [name] Jersey, [place] 01/20/2019, 9:19 AM

## 2019-01-20 NOTE — Discharge Summary (Signed)
Physician Discharge Summary Note  Patient:  Natasha Pope is an 23 y.o., female MRN:  [id number] DOB:  [birth year] Patient phone:  [phone] (home)  Patient address:   [address] [place] [address],  Total Time spent with patient: 45 minutes  Date of Admission:  01/16/2019 Date of Discharge: 01/19/2019  Reason for Admission:   History of Present Illness: 23 year old female, admitted to inpatient medical unit on 6/20. She presented for nausea, vomiting, abdominal discomfort, alcohol use . She also reported suicidal ideations. Admission BAL on 6/20 was 151. UDS was positive for BZDs. Reports indicate several prior ED visits over recent weeks to months related to alcohol use disorder. She has a history of alcohol WDL seizures . She was admitted to inpatient medical unit for management. Was discharged on 6/26, referred to inpatient psychiatric unit.  Currently patient reports her major stressor is anxiety. States she has chronic anxiety, and describes having frequent panic attacks. Currently denies suicidal ideations. Also denies neuro-vegetative symptoms at present. Denies anhedonia, denies pervasive sadness, denies changes in sleep, appetite, energy level.    Principal Problem: ETOH DEP Discharge Diagnoses: Active Problems:   Alcohol abuse with alcohol-induced mood disorder (HCC)   Alcohol intoxication with moderate or severe use disorder [facility])   Past Psychiatric History: EXT  Past Medical History:  Past Medical History:  Diagnosis Date  . Anxiety   . Asthma   . Depression   . Pyelonephritis   . Sepsis [facility])     Past Surgical History:  Procedure Laterality Date  . dental procedure     Family History:  Family History  Problem Relation Age of Onset  . Depression Mother   . Miscarriages / [place] Mother   . Hypertension Father   . Alcohol abuse Father   . Learning disabilities Sister   . Hyperlipidemia Maternal Grandmother   . Alcohol abuse  Maternal Grandfather   . Alcohol abuse Paternal Grandmother   . Hypertension Paternal Grandfather    Family Psychiatric  History: neg Social History:  Social History   Substance and Sexual Activity  Alcohol Use Yes   Comment: 2-3 days/week     Social History   Substance and Sexual Activity  Drug Use No    Social History   Socioeconomic History  . Marital status: Single    Spouse name: Not on file  . Number of children: Not on file  . Years of education: Not on file  . Highest education level: Not on file  Occupational History  . Not on file  Social Needs  . Financial resource strain: Not hard at all  . Food insecurity    Worry: Never true    Inability: Never true  . Transportation needs    Medical: No    Non-medical: No  Tobacco Use  . Smoking status: Former Smoker    Packs/day: 0.50    Types: Cigarettes    Quit date: 05/10/2018    Years since quitting: 0.6  . Smokeless tobacco: Never Used  Substance and Sexual Activity  . Alcohol use: Yes    Comment: 2-3 days/week  . Drug use: No  . Sexual activity: Not on file  Lifestyle  . Physical activity    Days per week: 7 days    Minutes per session: 70 min  . Stress: Rather much  Relationships  . Social connections    Talks on phone: More than three times a week    Gets together: Once a  week    Attends religious service: Never    Active member of club or organization: No    Attends meetings of clubs or organizations: Never    Relationship status: Never married  Other Topics Concern  . Not on file  Social History Narrative  . Not on file    Hospital Course:   Patient was admitted under routine precautions displayed no dangerous behaviors here suffer no seizure activity or psychosis while here. She was however focused on protracted withdrawal symptoms though none were particularly evident other than possibly tachycardia but she was given a Mysoline regimen as well as Cymbalta for depression. She was  medication seeking, seeking medications for anxiety from nurses and other practitioners throughout the day including a psychiatrist not assigned to her case.  She was however given Vistaril and found it helpful.  By the date of the 30th she stated she felt better she had no cravings tremors or withdrawal symptoms, no thoughts of harming self or others and was requesting discharge home.  Follow-up as per social work  Physical Findings: AIMS: Facial and Oral Movements Muscles of Facial Expression: None, normal Lips and Perioral Area: None, normal Jaw: None, normal Tongue: None, normal,Extremity Movements Upper (arms, wrists, hands, fingers): None, normal Lower (legs, knees, ankles, toes): None, normal, Trunk Movements Neck, shoulders, hips: None, normal, Overall Severity Severity of abnormal movements (highest score from questions above): None, normal Incapacitation due to abnormal movements: None, normal Patient's awareness of abnormal movements (rate only patient's report): No Awareness, Dental Status Current problems with teeth and/or dentures?: No Does patient usually wear dentures?: No  CIWA:  CIWA-Ar Total: 3 COWS:  COWS Total Score: 2   Musculoskeletal: Strength & Muscle Tone: within normal limits Gait & Station: normal Patient leans: N/A  Psychiatric Specialty Exam: ROS  Blood pressure 127/81, pulse (!) 123, temperature 98.2 F (36.8 C), temperature source Oral, resp. rate 18, height 5\' 6"  (1.676 m), weight 74.8 kg, SpO2 100 %.Body mass index is 26.63 kg/m.  General Appearance: Casual  Eye Contact::  Good  Speech:  Clear and Coherent409  Volume:  Normal  Mood:  Euthymic  Affect:  Congruent  Thought Process:  Coherent and Descriptions of Associations: Tangential  Orientation:  Full (Time, Place, and Person)  Thought Content:  Tangential  Suicidal Thoughts:  No  Homicidal Thoughts:  No  Memory:  Recent;   Fair  Judgement:  Fair  Insight:  Fair  Psychomotor Activity:   Normal  Concentration:  Good  Recall:  Good  Fund of Knowledge:Good  Language: Good  Akathisia:  Negative  Handed:  Right  AIMS (if indicated):     Assets:  Communication Skills Desire for Improvement Intimacy Leisure Time Physical Health Resilience  Sleep:  Number of Hours: 6.75  Cognition: intact  ADL's:  Intact    Have you used any form of tobacco in the last 30 days? (Cigarettes, Smokeless Tobacco, Cigars, and/or Pipes): Yes  Has this patient used any form of tobacco in the last 30 days? (Cigarettes, Smokeless Tobacco, Cigars, and/or Pipes) Yes, No  Blood Alcohol level:  Lab Results  Component Value Date   ETH 151 (H) 01/10/2019   ETH <10 12/26/2018    Metabolic Disorder Labs:  No results found for: HGBA1C, MPG No results found for: PROLACTIN Lab Results  Component Value Date   CHOL 164 01/31/2018   TRIG 122.0 01/31/2018   HDL 81.40 01/31/2018   CHOLHDL 2 01/31/2018   VLDL 24.4 01/31/2018  LDLCALC 58 01/31/2018    See Psychiatric Specialty Exam and Suicide Risk Assessment completed by Attending Physician prior to discharge.  Discharge destination:  Home  Is patient on multiple antipsychotic therapies at discharge:  No   Has Patient had three or more failed trials of antipsychotic monotherapy by history:  No  Recommended Plan for Multiple Antipsychotic Therapies: NA   Allergies as of 01/20/2019      Reactions   Ativan [lorazepam] Other (See Comments)   Mental status worsens with benzo, hallucination, agitation      Medication List    STOP taking these medications   alum & mag hydroxide-simeth 200-200-20 MG/5ML suspension Commonly known as: MAALOX/MYLANTA   diazepam 5 MG tablet Commonly known as: VALIUM   FLUoxetine 40 MG capsule Commonly known as: PROZAC   folic acid 1 MG tablet Commonly known as: FOLVITE   thiamine 100 MG tablet     TAKE these medications     Indication  Alive Once Daily Womens Tabs Take 1 tablet by mouth daily.   Indication: 21-Hydroxylase Deficiency   amLODipine 10 MG tablet Commonly known as: NORVASC Take 1 tablet (10 mg total) by mouth daily. Start taking on: January 21, 2019 What changed:   medication strength  how much to take  Indication: High Blood Pressure Disorder   DULoxetine 60 MG capsule Commonly known as: CYMBALTA Take 1 capsule (60 mg total) by mouth daily. Start taking on: January 21, 2019  Indication: Major Depressive Disorder   hydrOXYzine 25 MG tablet Commonly known as: ATARAX/VISTARIL Take 1 tablet (25 mg total) by mouth 3 (three) times daily as needed for anxiety.  Indication: Feeling Anxious   ibuprofen 200 MG tablet Commonly known as: ADVIL Take 1-2 tablets (200-400 mg total) by mouth 3 (three) times daily as needed for headache or moderate pain.  Indication: Headache   norethindrone-ethinyl estradiol 1-20 MG-MCG tablet Commonly known as: LOESTRIN FE Take 1 tablet by mouth daily.  Indication: Pain During Periods   phenytoin 100 MG ER capsule Commonly known as: DILANTIN Take 1 capsule (100 mg total) by mouth 3 (three) times daily.  Indication: Seizure   Potassium Chloride ER 20 MEQ Tbcr Take 20 mEq by mouth daily.  Indication: Low Amount of Potassium in the Blood   topiramate 50 MG tablet Commonly known as: TOPAMAX Take 1 tablet (50 mg total) by mouth 3 (three) times daily.  Indication: Antipsychotic Therapy-Induced Weight Gain, Binge Eating Disorder   traZODone 100 MG tablet Commonly known as: DESYREL Take 2 tablets (200 mg total) by mouth at bedtime. What changed:   medication strength  how much to take  when to take this  reasons to take this  Indication: Abuse or Misuse of Alcohol      Follow-up Information    [facility] Follow up on 01/29/2019.   Specialty: Behavioral Health Why: Medication management appointment with Dr. Demetrius CharityP is Thursday, 7/9 at 1:00p.  Therapy appointment with Rene KocherRegina is Friday, 7/10 at  9:00a.  App[facility] and a link will be emailed to you.  Contact information: [address] [place] [id number] [phone]          SignedMalvin Johns: Starsky Nanna, MD 01/20/2019, 9:36 AM

## 2019-01-20 NOTE — Progress Notes (Signed)
D:  Patient's self inventory sheet, patient sleeps good, sleep medication helpful.  Good appetite, high energy level, good concentration.  Denied depression and hopeless, anxiety 3.  Withdrawals, anxiety.  Denied physical problems.  Denied physical pain.  Goal is discharge, work again.  Plans to do anything and everything I have/need to.  I appreciate them all and why they have taken blood every morning.  Does have discharge plans. A:  Medications administered per MD orders.  Emotional support and encouragement given patient. R:  Denied SI and HI, contracts for safety.  Denied A/V hallucinations.   Denied pain.  Safety maintained with 15 minute checks.

## 2019-01-20 NOTE — Progress Notes (Signed)
Patient stated she needs vistaril to be increased to 50 mg.  Also would like prescription for robaxin 750 mg when she is discharged.

## 2019-01-20 NOTE — Progress Notes (Signed)
Discharge Note:  Patient discharged home with grandmother.  Denied SI and HI.  Denied A/V hallucinations.  Suicide prevention information given and discussed with patient who stated she understood and had no questions.  Patient stated she appreciated all assistance received from [facility] staff.  Patient stated she received all her belongings, clothing, toiletries, etc.  All required discharge information given to patient at discharge.

## 2019-01-20 NOTE — [facility] Suicide Risk Assessment (Signed)
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 rate 18, height 5\' 6"  (1.676 m), weight 74.8 kg, SpO2 100 %.Body mass index is 26.63 kg/m.  General Appearance: Casual  Eye Contact::  Good  Speech:  Clear and Coherent409  Volume:  Normal  Mood:  Euthymic  Affect:  Congruent  Thought Process:  Coherent and Descriptions of Associations: Tangential  Orientation:  Full (Time, Place, and Person)  Thought Content:  Tangential  Suicidal Thoughts:  No  Homicidal Thoughts:  No  Memory:  Recent;   Fair  Judgement:  Fair  Insight:  Fair  Psychomotor Activity:  Normal  Concentration:  Good  Recall:  Good  Fund of Knowledge:Good  Language: Good  Akathisia:  Negative  Handed:  Right  AIMS (if indicated):     Assets:  Communication Skills Desire for Improvement Intimacy Leisure Time Physical Health Resilience  Sleep:  Number of Hours: 6.75  Cognition: intact  ADL's:  Intact   Mental Status Per Nursing Assessment::   On Admission:  NA  Demographic Factors:  Unemployed  Loss Factors: Decrease in vocational status  Historical Factors: NA  Risk Reduction Factors:   Sense of responsibility to family and Religious beliefs about death  Continued Clinical Symptoms:  Alcohol/Substance Abuse/Dependencies  Cognitive Features That Contribute To Risk:  None    Suicide Risk:  Minimal: No identifiable suicidal ideation.  Patients presenting with no risk factors but with morbid ruminations; may be  classified as minimal risk based on the severity of the depressive symptoms  Follow-up [facility] Follow up on 01/29/2019.   Specialty: Behavioral Health Why: Medication management appointment with Dr. Mamie Nick is Thursday, 7/9 at 1:00p.  Therapy appointment with Rollene Fare is Friday, 7/10 at 9:00a.  Appointments will be done virtually through [facility] and a link will be emailed to you.  Contact information: [place] [phone]          Plan Of Care/Follow-up recommendations:  Activity:  full  Ozil Stettler, MD 01/20/2019, 9:30 AM

## 2019-01-20 NOTE — Progress Notes (Signed)
Pt has spent most of the shift in bed sleeping  She came to the nurses station upset because no one woke her up for her HS medications   Pt then did receive medications and went back to bed   [place] NOVEL CORONAVIRUS (COVID-19) DAILY CHECK-OFF SYMPTOMS - answer yes or no to each - every day NO YES  Have you had a fever in the past 24 hours?  . Fever (Temp > 37.80C / 100F) X   Have you had any of these symptoms in the past 24 hours? . New Cough .  Sore Throat  .  Shortness of Breath .  Difficulty Breathing .  Unexplained Body Aches   X   Have you had any one of these symptoms in the past 24 hours not related to allergies?   . Runny Nose .  Nasal Congestion .  Sneezing   X   If you have had runny nose, nasal congestion, sneezing in the past 24 hours, has it worsened?  X   EXPOSURES - check yes or no X   Have you traveled outside the state in the past 14 days?  X   Have you been in contact with someone with a confirmed diagnosis of COVID-19 or PUI in the past 14 days without wearing appropriate PPE?  X   Have you been living in the same home as a person with confirmed diagnosis of COVID-19 or a PUI (household contact)?    X   Have you been diagnosed with COVID-19?    X              What to do next: Answered NO to all: Answered YES to anything:   Proceed with unit schedule Follow the [facility] Inpatient Flowsheet.

## 2019-01-29 ENCOUNTER — Encounter (HOSPITAL_COMMUNITY): Payer: Self-pay | Admitting: Psychiatry

## 2019-01-29 ENCOUNTER — Other Ambulatory Visit: Payer: Self-pay

## 2019-01-29 ENCOUNTER — Ambulatory Visit (INDEPENDENT_AMBULATORY_CARE_PROVIDER_SITE_OTHER): Payer: 59 | Admitting: Psychiatry

## 2019-01-29 DIAGNOSIS — F411 Generalized anxiety disorder: Secondary | ICD-10-CM | POA: Diagnosis not present

## 2019-01-29 DIAGNOSIS — F1020 Alcohol dependence, uncomplicated: Secondary | ICD-10-CM

## 2019-01-29 DIAGNOSIS — F4001 Agoraphobia with panic disorder: Secondary | ICD-10-CM | POA: Diagnosis not present

## 2019-01-29 DIAGNOSIS — F1994 Other psychoactive substance use, unspecified with psychoactive substance-induced mood disorder: Secondary | ICD-10-CM

## 2019-01-29 MED ORDER — HYDROXYZINE HCL 50 MG PO TABS: 50.0000 mg | ORAL_TABLET | Freq: Three times a day (TID) | ORAL | 0 refills | Status: DC | PRN

## 2019-01-29 MED ORDER — NALTREXONE HCL 50 MG PO TABS: 50.0000 mg | ORAL_TABLET | Freq: Every day | ORAL | 2 refills | Status: DC

## 2019-01-29 MED ORDER — METHOCARBAMOL 750 MG PO TABS: 750.0000 mg | ORAL_TABLET | Freq: Three times a day (TID) | ORAL | 0 refills | Status: DC | PRN
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
## 2019-01-30 ENCOUNTER — Ambulatory Visit (INDEPENDENT_AMBULATORY_CARE_PROVIDER_SITE_OTHER): Payer: 59 | Admitting: Licensed Clinical Social Worker

## 2019-01-30 DIAGNOSIS — F411 Generalized anxiety disorder: Secondary | ICD-10-CM
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
## 2019-02-04 ENCOUNTER — Other Ambulatory Visit (HOSPITAL_COMMUNITY): Payer: Self-pay

## 2019-02-04 MED ORDER — HYDROXYZINE HCL 50 MG PO TABS: 50.0000 mg | ORAL_TABLET | Freq: Three times a day (TID) | ORAL | 0 refills | Status: DC | PRN

## 2019-02-12 ENCOUNTER — Ambulatory Visit (INDEPENDENT_AMBULATORY_CARE_PROVIDER_SITE_OTHER): Payer: 59 | Admitting: Licensed Clinical Social Worker

## 2019-02-12 DIAGNOSIS — F411 Generalized anxiety disorder: Secondary | ICD-10-CM | POA: Diagnosis not present

## 2019-02-12 DIAGNOSIS — F1020 Alcohol dependence, uncomplicated: Secondary | ICD-10-CM

## 2019-02-12 NOTE — Progress Notes (Signed)
Patient ID: Natasha Pope, female   DOB: October 29, 1995, 23 y.o.   MRN: [id number]  Virtual Visit via Video Note  I connected with Natasha Pope on 02/12/19 at  3:00 PM EDT by a video enabled telemedicine application and verified that I am speaking with the correct person using two identifiers.  I discussed the limitations of evaluation and management by telemedicine and the availability of in person appointments. The patient expressed understanding and agreed to proceed.  I discussed the assessment and treatment plan with the patient. The patient was provided an opportunity to ask questions and all were answered. The patient agreed with the plan and demonstrated an understanding of the instructions.   The patient was advised to call back or seek an in-person evaluation if the symptoms worsen or if the condition fails to improve as anticipated.  Type of Therapy: Individual Therapy  Treatment Goals addressed:  Elevate mood to show evidence of usual levels of energy, activity and socialization.   Interventions: Stress Innoculation, Supportive Counseling  Summary: Natasha Pope is a 23 y.o. female who presents with symptoms of anxiety to include nervousness, shaking, difficulty in public places, depressed mood, low motivation/energy.  Therapist Response:  Patient met with clinician for an individual session. Patient reports that her physical anxiety symptoms, including shaking and difficulty focusing, are still present but she can tell that the medication is helping her to reduce those things. She discussed the embarrassment she has felt as a result of these symptoms.  Counselor processed with her times that these symptoms have happened. Patient identified that they are often when she is in public, though she is not around people often. Counselor guided patient to discuss the role drinking and anxiety played for one another. Patient states that she believes she was drinking more than  usual to deal with the anxiety. Counselor educated patient on sensory grounding and guided her in some of these techniques. Counselor also explored intentional breathing with patient. Patient stated that she has tried breathing techniques before but does not believe they work for her because she cannot focus when anxious. Counselor and patient practiced being intentional with focus and attention. Counselor discussed with patient the concept of externalizing anxiety, and using therapeutic self-statements or mantras to relax her mind and body. Patient had questions about her medications, and counselor recommended that she call to speak with Dr. Montel Culver about this.   Suicidal/Homicidal:  No   Recommendation and plan:  Continued sessions every other week  Diagnosis:  Generalized Anxiety Disorder  I provided 55 minutes of non-face-to-face time during this encounter.   Lillie Fragmin, LCSW

## 2019-02-18 ENCOUNTER — Other Ambulatory Visit (HOSPITAL_COMMUNITY): Payer: Self-pay | Admitting: Psychiatry

## 2019-02-20 ENCOUNTER — Other Ambulatory Visit (HOSPITAL_COMMUNITY): Payer: Self-pay | Admitting: Psychiatry

## 2019-02-22 ENCOUNTER — Other Ambulatory Visit (HOSPITAL_COMMUNITY): Payer: Self-pay | Admitting: Psychiatry

## 2019-02-23 ENCOUNTER — Other Ambulatory Visit (HOSPITAL_COMMUNITY): Payer: Self-pay

## 2019-02-23 MED ORDER — METHOCARBAMOL 750 MG PO TABS: 750.0000 mg | ORAL_TABLET | Freq: Three times a day (TID) | ORAL | 0 refills | Status: AC | PRN

## 2019-02-25 ENCOUNTER — Ambulatory Visit (INDEPENDENT_AMBULATORY_CARE_PROVIDER_SITE_OTHER): Payer: 59 | Admitting: Licensed Clinical Social Worker

## 2019-02-25 DIAGNOSIS — F411 Generalized anxiety disorder: Secondary | ICD-10-CM
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
## 2019-02-26 ENCOUNTER — Other Ambulatory Visit (HOSPITAL_COMMUNITY): Payer: Self-pay | Admitting: Psychiatry

## 2019-03-05 ENCOUNTER — Other Ambulatory Visit: Payer: Self-pay

## 2019-03-05 ENCOUNTER — Ambulatory Visit (INDEPENDENT_AMBULATORY_CARE_PROVIDER_SITE_OTHER): Payer: 59 | Admitting: Psychiatry

## 2019-03-05 DIAGNOSIS — F1020 Alcohol dependence, uncomplicated: Secondary | ICD-10-CM | POA: Diagnosis not present

## 2019-03-05 DIAGNOSIS — F330 Major depressive disorder, recurrent, mild: Secondary | ICD-10-CM

## 2019-03-05 DIAGNOSIS — F411 Generalized anxiety disorder: Secondary | ICD-10-CM

## 2019-03-05 MED ORDER — HYDROXYZINE HCL 50 MG PO TABS: 50.0000 mg | ORAL_TABLET | Freq: Three times a day (TID) | ORAL | 0 refills | Status: DC | PRN

## 2019-03-05 MED ORDER — BUPROPION HCL ER (XL) 150 MG PO TB24: 150.0000 mg | ORAL_TABLET | Freq: Every day | ORAL | 0 refills | Status: DC

## 2019-03-05 MED ORDER — PHENYTOIN SODIUM EXTENDED 100 MG PO CAPS: 100.0000 mg | ORAL_CAPSULE | Freq: Three times a day (TID) | ORAL | 0 refills | Status: DC
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
## 2019-03-23 ENCOUNTER — Ambulatory Visit (INDEPENDENT_AMBULATORY_CARE_PROVIDER_SITE_OTHER): Payer: 59 | Admitting: Psychology

## 2019-03-23 ENCOUNTER — Other Ambulatory Visit: Payer: Self-pay

## 2019-03-23 DIAGNOSIS — F330 Major depressive disorder, recurrent, mild: Secondary | ICD-10-CM

## 2019-03-23 DIAGNOSIS — F1020 Alcohol dependence, uncomplicated: Secondary | ICD-10-CM | POA: Diagnosis not present

## 2019-03-23 DIAGNOSIS — F411 Generalized anxiety disorder: Secondary | ICD-10-CM | POA: Diagnosis not present

## 2019-03-23 NOTE — Progress Notes (Signed)
Virtual Visit via Telephone Note  I connected with Natasha Pope on 03/23/19 at 12:30 PM EDT by telephone, as pt reports webex will not work for her, and verified that I am speaking with the correct person using two identifiers.   I discussed the limitations, risks, security and privacy concerns of performing an evaluation and management service by telephone and the availability of in person appointments. I also discussed with the patient that there may be a patient responsible charge related to this service. The patient expressed understanding and agreed to proceed.    I discussed the assessment and treatment plan with the patient. The patient was provided an opportunity to ask questions and all were answered. The patient agreed with the plan and demonstrated an understanding of the instructions.   The patient was advised to call back or seek an in-person evaluation if the symptoms worsen or if the condition fails to improve as anticipated.  I provided 47 minutes of non-face-to-face time during this encounter.   Jan Fireman [facility]    THERAPIST PROGRESS NOTE  Session Time: 12.30pm-1.17pm  Participation Level: Active  Behavioral Response: N/A as over the phoneAlertaffect wnl  Type of Therapy: Individual Therapy  Treatment Goals addressed: Diagnosis: GAD, MDD and goal1.  Interventions: CBT and Supportive  Summary: Natasha Pope is a 23 y.o. female who presents with affect wnl.  Pt was unaware of scheduled appointment per her report.  Pt reported that she thought another lady had called and was her therapist that she transferred to- but informed that the call was very short so perhaps just the nurse.  Pt reported that she had been working w/ Rollene Fare on her anxiety and depression, family problems and working on anxiety that is severe when going out in public.  Pt reports she has been work w/ Dr. Montel Culver and feels that the medications prescribed have helped her an  dfeeling more stable.  Pt reports that she had experienced anxiety that began around 23y/o but didn't get very severe until she was 22y/o.  Pt discussed also that she had problems w/ drinking that began in teen years.  Pt reported that she hasn't drank since inpt tx and w/ medication prescribed no cravings.  Pt reported that her maternal grandmother is a huge support and so thankful that she could come down to live w/her and help her out.  Pt reports that she has days were she just feels negative and irritable and other days where she feels good.  Pt reported that she has heard from HR at [employer] and has to f/u w/ them about a possible job.  Pt is hopeful for this.  Pt reports that relationship w/ other family isn't good.  Pt discussed her goals to have someone to talk to and get different perspective, coping skills and not be so unsteady.     Suicidal/Homicidal: Nowithout intent/plan  Therapist Response: Assessed pt current functioning per pt report.  Assisted pt w/ transfer to new therapist and building rapport and understanding her tx progress and continued needs.  Developed goals and tx plan.   Plan: Return again in 2 weeks via telephone visit.  Diagnosis: GAD,MDD, alcohol dependence.    Jan Fireman [facility] 03/23/2019

## 2019-04-04 ENCOUNTER — Other Ambulatory Visit (HOSPITAL_COMMUNITY): Payer: Self-pay | Admitting: Psychiatry

## 2019-04-06 ENCOUNTER — Telehealth (HOSPITAL_COMMUNITY): Payer: Self-pay

## 2019-04-06 ENCOUNTER — Other Ambulatory Visit (HOSPITAL_COMMUNITY): Payer: Self-pay | Admitting: Psychiatry

## 2019-04-06 ENCOUNTER — Other Ambulatory Visit: Payer: Self-pay

## 2019-04-06 ENCOUNTER — Ambulatory Visit (INDEPENDENT_AMBULATORY_CARE_PROVIDER_SITE_OTHER): Payer: 59 | Admitting: Psychology

## 2019-04-06 DIAGNOSIS — F330 Major depressive disorder, recurrent, mild: Secondary | ICD-10-CM

## 2019-04-06 DIAGNOSIS — F1020 Alcohol dependence, uncomplicated: Secondary | ICD-10-CM

## 2019-04-06 DIAGNOSIS — F411 Generalized anxiety disorder: Secondary | ICD-10-CM | POA: Diagnosis not present

## 2019-04-06 MED ORDER — METHOCARBAMOL 500 MG PO TABS: 500.0000 mg | ORAL_TABLET | Freq: Three times a day (TID) | ORAL | 1 refills | Status: DC | PRN

## 2019-04-06 NOTE — Telephone Encounter (Signed)
Patient is calling for a refill on Robaxin, she said you prescribed it, but it is no longer in her medication list - it is in her history. She asked to speak with you, but did not indicate the reason. Please review and advise, thank you

## 2019-04-06 NOTE — Progress Notes (Signed)
Virtual Visit via Telephone Note  I connected with [place] on 04/06/19 at 12:30 PM EDT by telephone, as no access to video, and verified that I am speaking with the correct person using two identifiers.   I discussed the limitations, risks, security and privacy concerns of performing an evaluation and management service by telephone and the availability of in person appointments. I also discussed with the patient that there may be a patient responsible charge related to this service. The patient expressed understanding and agreed to proceed.   I discussed the assessment and treatment plan with the patient. The patient was provided an opportunity to ask questions and all were answered. The patient agreed with the plan and demonstrated an understanding of the instructions.   The patient was advised to call back or seek an in-person evaluation if the symptoms worsen or if the condition fails to improve as anticipated.  I provided 15 minutes of non-face-to-face time during this encounter.   Jan Fireman, [facility]    THERAPIST PROGRESS NOTE  Session Time: 12.30pm-12.45pm  Participation Level: Minimal  Behavioral Response: n/a over the phoneAlertaffect wnl  Type of Therapy: Individual Therapy  Treatment Goals addressed: Diagnosis: GAd, MDD and goal 1.  Interventions: CBT and Supportive  Summary: Natasha Pope is a 23 y.o. female who presents with affect wnl.  Pt intially stated she was about to call to get a refill on her medication.  Pt reported that she is doing well- no concerns and nothing really to talk about today.  Pt reported mood has been good.  Pt reported that her grandmother returned home for several weeks to handle things and connect w/ her partner.  Pt reported that she is doing ok- more lonely but good for her to be more independent to take care of things.  Pt reported she was disappointed as was given an interview and then company called back 40 minutes  later cancelling informing it had been filled.  Pt reported she is filling out applications.  Pt again stated nothing to talk about and ready to complete appointment as very tired w/ stuffy nose.   Suicidal/Homicidal: Nowithout intent/plan  Therapist Response: Assessed pt current functioning per pt report.  Informed w/ pt process to seek refill for medication- contacting the office and providing phone number. Processed w/pt grandmother's departure.  Explored w/pt steps taking and encouraging to continue dispite disappointment.  Discussed frequency for appointments to confirm pt wants for tx.  Plan: Return again in 2 weeks, via telephone.  Pt to f/u as scheduled w/ Dr. Montel Culver.  Pt to f/u as needed w/ PCP re: any medical needs.  Diagnosis: GAD, MDD  Jan Fireman [facility] 04/06/2019

## 2019-04-06 NOTE — Telephone Encounter (Signed)
I reordered it but if she feels like she needs it long term she needs to contact her PCP to continue it.

## 2019-04-09 ENCOUNTER — Other Ambulatory Visit: Payer: Self-pay

## 2019-04-09 ENCOUNTER — Ambulatory Visit (INDEPENDENT_AMBULATORY_CARE_PROVIDER_SITE_OTHER): Payer: 59 | Admitting: Psychiatry

## 2019-04-09 DIAGNOSIS — F1020 Alcohol dependence, uncomplicated: Secondary | ICD-10-CM

## 2019-04-09 DIAGNOSIS — F4001 Agoraphobia with panic disorder: Secondary | ICD-10-CM

## 2019-04-09 DIAGNOSIS — F3341 Major depressive disorder, recurrent, in partial remission: Secondary | ICD-10-CM

## 2019-04-09 DIAGNOSIS — F411 Generalized anxiety disorder: Secondary | ICD-10-CM

## 2019-04-09 MED ORDER — NALTREXONE HCL 50 MG PO TABS: 50.0000 mg | ORAL_TABLET | Freq: Every day | ORAL | 2 refills | Status: DC

## 2019-04-09 MED ORDER — TRAZODONE HCL 100 MG PO TABS: 200.0000 mg | ORAL_TABLET | Freq: Every day | ORAL | 2 refills | Status: DC

## 2019-04-09 MED ORDER — METHOCARBAMOL 750 MG PO TABS: 750.0000 mg | ORAL_TABLET | Freq: Three times a day (TID) | ORAL | 1 refills | Status: DC | PRN

## 2019-04-09 MED ORDER — PHENYTOIN SODIUM EXTENDED 100 MG PO CAPS: 100.0000 mg | ORAL_CAPSULE | Freq: Three times a day (TID) | ORAL | 0 refills | Status: DC

## 2019-04-09 MED ORDER — TOPIRAMATE 50 MG PO TABS: 50.0000 mg | ORAL_TABLET | Freq: Three times a day (TID) | ORAL | 2 refills | Status: DC

## 2019-04-09 MED ORDER — BUPROPION HCL ER (XL) 150 MG PO TB24: 150.0000 mg | ORAL_TABLET | Freq: Every day | ORAL | 0 refills | Status: DC

## 2019-04-09 MED ORDER — HYDROXYZINE HCL 50 MG PO TABS: 50.0000 mg | ORAL_TABLET | Freq: Three times a day (TID) | ORAL | 0 refills | Status: DC | PRN
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
## 2019-04-20 ENCOUNTER — Other Ambulatory Visit: Payer: Self-pay

## 2019-04-20 ENCOUNTER — Ambulatory Visit (HOSPITAL_COMMUNITY): Payer: 59 | Admitting: Psychology

## 2019-04-20 ENCOUNTER — Encounter (HOSPITAL_COMMUNITY): Payer: Self-pay | Admitting: Psychology

## 2019-04-20 NOTE — Progress Notes (Signed)
Natasha Pope is a 23 y.o. female patient who didn't show for her virtual telephone therapy session.  Counselor left message and informed to call office if would like to schedule f/u.        Jan Fireman, [facility]

## 2019-04-22 ENCOUNTER — Other Ambulatory Visit (HOSPITAL_COMMUNITY): Payer: Self-pay

## 2019-04-22 MED ORDER — TOPIRAMATE 50 MG PO TABS: 50.0000 mg | ORAL_TABLET | Freq: Three times a day (TID) | ORAL | 2 refills | Status: DC

## 2019-04-28 ENCOUNTER — Telehealth (HOSPITAL_COMMUNITY): Payer: Self-pay

## 2019-04-28 NOTE — Telephone Encounter (Signed)
Patient is calling about her Gabapentin. She states that she takes 100 mg TID and that it is for seizures. I have advised the patient that she will need to see a neurologist about the seizures and she wants to know if you are able to fill it for her. Please review and advise, thank you.  Patient just called back, she would like to speak to you.Natasha KitchenMarland Pope

## 2019-04-29 ENCOUNTER — Other Ambulatory Visit (HOSPITAL_COMMUNITY): Payer: Self-pay | Admitting: Psychiatry

## 2019-04-29 MED ORDER — GABAPENTIN 100 MG PO CAPS: 100.0000 mg | ORAL_CAPSULE | Freq: Three times a day (TID) | ORAL | 1 refills | Status: DC

## 2019-04-29 NOTE — Telephone Encounter (Signed)
I did reorder it but I doubt it is for seizures: dose too low for that and she is on a decent dose of phenytoin for seizures. She could benfit from it for anxiety though.

## 2019-04-30 NOTE — Telephone Encounter (Signed)
I called patient back and let her know. 

## 2019-05-02 ENCOUNTER — Encounter (HOSPITAL_COMMUNITY): Payer: Self-pay | Admitting: *Deleted

## 2019-05-02 ENCOUNTER — Other Ambulatory Visit: Payer: Self-pay

## 2019-05-02 ENCOUNTER — Emergency Department (HOSPITAL_COMMUNITY)
Admission: EM | Admit: 2019-05-02 | Discharge: 2019-05-03 | Disposition: A | Discharge status: Home / Self Care | Payer: 59 | Attending: Emergency Medicine | Admitting: Emergency Medicine

## 2019-05-02 DIAGNOSIS — Z20828 Contact with and (suspected) exposure to other viral communicable diseases: Secondary | ICD-10-CM | POA: Insufficient documentation

## 2019-05-02 DIAGNOSIS — Z046 Encounter for general psychiatric examination, requested by authority: Secondary | ICD-10-CM | POA: Insufficient documentation

## 2019-05-02 DIAGNOSIS — Y907 Blood alcohol level of 200-239 mg/100 ml: Secondary | ICD-10-CM | POA: Insufficient documentation

## 2019-05-02 DIAGNOSIS — F10920 Alcohol use, unspecified with intoxication, uncomplicated: Secondary | ICD-10-CM

## 2019-05-02 DIAGNOSIS — F1910 Other psychoactive substance abuse, uncomplicated: Secondary | ICD-10-CM | POA: Insufficient documentation

## 2019-05-02 DIAGNOSIS — F918 Other conduct disorders: Secondary | ICD-10-CM | POA: Insufficient documentation

## 2019-05-02 DIAGNOSIS — R451 Restlessness and agitation: Secondary | ICD-10-CM | POA: Insufficient documentation

## 2019-05-02 DIAGNOSIS — R4689 Other symptoms and signs involving appearance and behavior: Secondary | ICD-10-CM

## 2019-05-02 DIAGNOSIS — J45909 Unspecified asthma, uncomplicated: Secondary | ICD-10-CM | POA: Insufficient documentation

## 2019-05-02 DIAGNOSIS — F1994 Other psychoactive substance use, unspecified with psychoactive substance-induced mood disorder: Secondary | ICD-10-CM | POA: Diagnosis present

## 2019-05-02 DIAGNOSIS — F10280 Alcohol dependence with alcohol-induced anxiety disorder: Secondary | ICD-10-CM | POA: Diagnosis not present

## 2019-05-02 DIAGNOSIS — Z87891 Personal history of nicotine dependence: Secondary | ICD-10-CM | POA: Diagnosis not present

## 2019-05-02 LAB — SALICYLATE LEVEL: Salicylate Lvl: <7 mg/dL (ref 2.8–30.0)

## 2019-05-02 LAB — ETHANOL: Alcohol, Ethyl (B): 227 mg/dL — ABNORMAL HIGH (ref <10)

## 2019-05-02 LAB — ACETAMINOPHEN LEVEL: Acetaminophen (Tylenol), Serum: <10 ug/mL — ABNORMAL LOW (ref 10–30)

## 2019-05-02 MED ORDER — ZIPRASIDONE MESYLATE 20 MG IM SOLR
20.0000 mg | Freq: Once | INTRAMUSCULAR | Status: AC
  Administered 2019-05-02: 20 mg via INTRAMUSCULAR

## 2019-05-02 MED ORDER — ONDANSETRON HCL 4 MG PO TABS: 4.0000 mg | ORAL_TABLET | Freq: Three times a day (TID) | ORAL | Status: DC | PRN

## 2019-05-02 MED ORDER — STERILE WATER FOR INJECTION IJ SOLN
INTRAMUSCULAR | Status: AC
  Administered 2019-05-02: 20:00:00
  Filled 2019-05-02: qty 10

## 2019-05-02 MED ORDER — DIPHENHYDRAMINE HCL 50 MG/ML IJ SOLN
50.0000 mg | Freq: Once | INTRAMUSCULAR | Status: AC
  Administered 2019-05-02: 50 mg via INTRAMUSCULAR
  Filled 2019-05-02: qty 1

## 2019-05-02 MED ORDER — ALUM & MAG HYDROXIDE-SIMETH 200-200-20 MG/5ML PO SUSP: 30.0000 mL | Freq: Four times a day (QID) | ORAL | Status: DC | PRN

## 2019-05-02 MED ORDER — ACETAMINOPHEN 325 MG PO TABS: 650.0000 mg | ORAL_TABLET | ORAL | Status: DC | PRN

## 2019-05-02 MED ORDER — NICOTINE 21 MG/24HR TD PT24: 21.0000 mg | MEDICATED_PATCH | Freq: Every day | TRANSDERMAL | Status: DC

## 2019-05-02 MED ORDER — ZIPRASIDONE MESYLATE 20 MG IM SOLR
10.0000 mg | Freq: Once | INTRAMUSCULAR | Status: DC
  Filled 2019-05-02: qty 20

## 2019-05-02 NOTE — ED Notes (Addendum)
Pt is resting, she is calm, alert and oriented, answering questions appropriately, she spoke to her family. At this time it is safe to let the [employer] leave pt's side.

## 2019-05-02 NOTE — ED Notes (Signed)
Pt is a very difficult stick, phlebotomist unable to collect all of the labs ordered.
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
## 2019-05-02 NOTE — ED Notes (Signed)
Pt is much calmer now, [employer] took handcuffs off, officers in the room talking to pt

## 2019-05-02 NOTE — ED Notes (Signed)
Hospital phlebotomy at bedside to attempt the blood draw.
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
## 2019-05-03 LAB — COMPREHENSIVE METABOLIC PANEL
ALT: 37 U/L (ref 0–44)
AST: 79 U/L — ABNORMAL HIGH (ref 15–41)
Albumin: 4.5 g/dL (ref 3.5–5.0)
Alkaline Phosphatase: 98 U/L (ref 38–126)
Anion gap: 23 — ABNORMAL HIGH (ref 5–15)
BUN: 6 mg/dL (ref 6–20)
CO2: 14 mmol/L — ABNORMAL LOW (ref 22–32)
Calcium: 9.1 mg/dL (ref 8.9–10.3)
Chloride: 107 mmol/L (ref 98–111)
Creatinine, Ser: 0.73 mg/dL (ref 0.44–1.00)
GFR calc Af Amer: >60 mL/min (ref >60)
GFR calc non Af Amer: >60 mL/min (ref >60)
Glucose, Bld: 67 mg/dL — ABNORMAL LOW (ref 70–99)
Potassium: 4.3 mmol/L (ref 3.5–5.1)
Sodium: 144 mmol/L (ref 135–145)
Total Bilirubin: 0.5 mg/dL (ref 0.3–1.2)
Total Protein: 7.9 g/dL (ref 6.5–8.1)

## 2019-05-03 LAB — URINALYSIS, ROUTINE W REFLEX MICROSCOPIC
Bilirubin Urine: NEGATIVE
Glucose, UA: NEGATIVE mg/dL
Ketones, ur: 5 mg/dL — AB
Nitrite: NEGATIVE
Protein, ur: 100 mg/dL — AB
Specific Gravity, Urine: 1.01 (ref 1.005–1.030)
pH: 6 (ref 5.0–8.0)

## 2019-05-03 LAB — PREGNANCY, URINE: Preg Test, Ur: NEGATIVE

## 2019-05-03 LAB — RAPID URINE DRUG SCREEN, HOSP PERFORMED
Amphetamines: NOT DETECTED
Barbiturates: NOT DETECTED
Benzodiazepines: NOT DETECTED
Cocaine: POSITIVE — AB
Opiates: NOT DETECTED
Tetrahydrocannabinol: POSITIVE — AB

## 2019-05-03 LAB — SARS CORONAVIRUS 2 BY RT PCR (HOSPITAL ORDER, PERFORMED IN ~~LOC~~ HOSPITAL LAB): SARS Coronavirus 2: NEGATIVE

## 2019-05-03 LAB — LIPASE, BLOOD: Lipase: 24 U/L (ref 11–51)

## 2019-05-03 MED ORDER — POTASSIUM CHLORIDE CRYS ER 20 MEQ PO TBCR
20.0000 meq | EXTENDED_RELEASE_TABLET | Freq: Every day | ORAL | Status: DC
  Administered 2019-05-03: 20 meq via ORAL
  Filled 2019-05-03: qty 1

## 2019-05-03 MED ORDER — BUPROPION HCL ER (XL) 150 MG PO TB24
150.0000 mg | ORAL_TABLET | Freq: Every day | ORAL | Status: DC
  Administered 2019-05-03: 150 mg via ORAL
  Filled 2019-05-03: qty 1

## 2019-05-03 MED ORDER — GABAPENTIN 100 MG PO CAPS
100.0000 mg | ORAL_CAPSULE | Freq: Three times a day (TID) | ORAL | Status: DC
  Administered 2019-05-03: 100 mg via ORAL

## 2019-05-03 MED ORDER — PHENYTOIN SODIUM EXTENDED 100 MG PO CAPS
100.0000 mg | ORAL_CAPSULE | Freq: Three times a day (TID) | ORAL | Status: DC
  Administered 2019-05-03: 100 mg via ORAL
  Filled 2019-05-03: qty 1

## 2019-05-03 MED ORDER — ADULT MULTIVITAMIN W/MINERALS CH
1.0000 | ORAL_TABLET | Freq: Every day | ORAL | Status: DC
  Administered 2019-05-03: 1 via ORAL
  Filled 2019-05-03: qty 1

## 2019-05-03 MED ORDER — TRAZODONE HCL 100 MG PO TABS: 200.0000 mg | ORAL_TABLET | Freq: Every day | ORAL | Status: DC

## 2019-05-03 MED ORDER — HYDROXYZINE HCL 25 MG PO TABS
50.0000 mg | ORAL_TABLET | Freq: Three times a day (TID) | ORAL | Status: DC | PRN
  Filled 2019-05-03: qty 2

## 2019-05-03 MED ORDER — DULOXETINE HCL 30 MG PO CPEP
60.0000 mg | ORAL_CAPSULE | Freq: Every day | ORAL | Status: DC
  Administered 2019-05-03: 60 mg via ORAL
  Filled 2019-05-03: qty 2

## 2019-05-03 MED ORDER — METHOCARBAMOL 500 MG PO TABS: 750.0000 mg | ORAL_TABLET | Freq: Three times a day (TID) | ORAL | Status: DC | PRN

## 2019-05-03 MED ORDER — TOPIRAMATE 25 MG PO TABS
50.0000 mg | ORAL_TABLET | Freq: Three times a day (TID) | ORAL | Status: DC
  Administered 2019-05-03: 50 mg via ORAL
  Filled 2019-05-03: qty 2

## 2019-05-03 MED ORDER — IBUPROFEN 200 MG PO TABS: 200.0000 mg | ORAL_TABLET | Freq: Three times a day (TID) | ORAL | Status: DC | PRN

## 2019-05-03 MED ORDER — AMLODIPINE BESYLATE 5 MG PO TABS
10.0000 mg | ORAL_TABLET | Freq: Every day | ORAL | Status: DC
  Administered 2019-05-03: 10 mg via ORAL
  Filled 2019-05-03: qty 2
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
## 2019-05-03 NOTE — ED Notes (Signed)
Pt did not want to eat breakfast, this NT threw the tray away.

## 2019-05-03 NOTE — ED Notes (Signed)
Called patient's grandmother to let her know patient was being discharged.  Grandmother is going to come and pick up patient.

## 2019-05-03 NOTE — ED Provider Notes (Signed)
Spoke w/ [facility] and patient, no current acute psych condition requiring hospitalization Will give resource guide for rehab    Lacretia Leigh, MD 05/03/19 1054

## 2019-05-03 NOTE — ED Notes (Signed)
Pt has a visitor in the room.
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
## 2019-05-03 NOTE — ED Notes (Signed)
Pt asking for anxiety medication, RN made aware. Pt made aware that RN is waiting for response from MD.

## 2019-05-03 NOTE — BHH Suicide Risk Assessment (Cosign Needed)
Suicide Risk Assessment  Discharge Assessment   [facility] Discharge Suicide Risk Assessment   Principal Problem: Substance induced mood disorder [facility]) Discharge Diagnoses: Principal Problem:   Substance induced mood disorder (Daniel)   Total Time spent with patient: 15 minutes  Patient alert and oriented for assessment, answers appropriately. Patient denies thoughts of suicide and homicide. Patient denies hallucinations. Patient denies history of suicide attempt, denies history of self-harm. Patient's grandmother, with whom she lives, at bedside during assessment. Patient verbally consents for collateral information collected from grandmother. Patient's grandmother, Jackelyn Poling, denies any concern for patient's safety. Patient to return home with grandmother. There are no weapons in grandmother's home.  Musculoskeletal: Strength & Muscle Tone: within normal limits Gait & Station: normal Patient leans: N/A  Psychiatric Specialty Exam:   Blood pressure 121/74, pulse 77, temperature 97.8 F (36.6 C), temperature source Oral, resp. rate 15, SpO2 98 %.There is no height or weight on file to calculate BMI.  General Appearance: Casual  Eye Contact::  Good  Speech:  Clear and Coherent409  Volume:  Normal  Mood:  Euthymic  Affect:  Appropriate  Thought Process:  Coherent and Descriptions of Associations: Intact  Orientation:  Full (Time, Place, and Person)  Thought Content:  WDL and Logical  Suicidal Thoughts:  No  Homicidal Thoughts:  No  Memory:  Immediate;   Good Recent;   Good Remote;   Good  Judgement:  Fair  Insight:  Fair  Psychomotor Activity:  Normal  Concentration:  Fair  Recall:  Good  Fund of Knowledge:Good  Language: Good  Akathisia:  No  Handed:  Right  AIMS (if indicated):     Assets:  Agricultural consultant Housing Social Support  Sleep:     Cognition: WNL  ADL's:  Intact   Mental Status Per Nursing Assessment::   On Admission:      Demographic Factors:  Caucasian  Loss Factors: NA  Historical Factors: NA  Risk Reduction Factors:   Living with another person, especially a relative, Positive social support and Positive therapeutic relationship  Continued Clinical Symptoms:  Alcohol/Substance Abuse/Dependencies  Cognitive Features That Contribute To Risk:  None    Suicide Risk:  Minimal: No identifiable suicidal ideation.  Patients presenting with no risk factors but with morbid ruminations; may be classified as minimal risk based on the severity of the depressive symptoms    Plan Of Care/Follow-up recommendations:  Other:  Patient to follow-up at [facility], discharge home.  Emmaline Kluver, FNP 05/03/2019, 10:54 AM

## 2019-05-03 NOTE — ED Notes (Signed)
Family at bedside. 

## 2019-05-03 NOTE — Progress Notes (Signed)
TTS attempted to assess pt who is sleeping and unable to be aroused. Per RN pt was medicated. ED staff to call TTS when the pt is ready to be seen.  Lind Covert, MSW, LCSW Therapeutic Triage Specialist  [phone]

## 2019-05-03 NOTE — ED Notes (Signed)
Pt given breakfast tray
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
## 2019-05-03 NOTE — ED Notes (Signed)
Pt doing TTS consult.

## 2019-05-03 NOTE — ED Notes (Signed)
Hospital phlebotomist and this nurse at the bedside to try an collect CBC blood sample, once again unsuccessful. Blood collected via finger stick was not sufficient. Pt was awake and cooperative during the venipuncture, fluids offered, she went back to sleep afterward.

## 2019-05-03 NOTE — ED Notes (Signed)
Pt given lunch tray.

## 2019-05-22 ENCOUNTER — Telehealth (HOSPITAL_COMMUNITY): Payer: Self-pay | Admitting: Licensed Clinical Social Worker

## 2019-05-26 ENCOUNTER — Telehealth (HOSPITAL_COMMUNITY): Payer: Self-pay | Admitting: Licensed Clinical Social Worker

## 2019-06-01 ENCOUNTER — Other Ambulatory Visit (HOSPITAL_COMMUNITY): Payer: Self-pay

## 2019-06-01 MED ORDER — TRAZODONE HCL 100 MG PO TABS: 200.0000 mg | ORAL_TABLET | Freq: Every day | ORAL | 2 refills | Status: DC

## 2019-06-08 ENCOUNTER — Ambulatory Visit (INDEPENDENT_AMBULATORY_CARE_PROVIDER_SITE_OTHER): Payer: 59 | Admitting: Psychiatry

## 2019-06-08 ENCOUNTER — Other Ambulatory Visit: Payer: Self-pay

## 2019-06-08 DIAGNOSIS — F4001 Agoraphobia with panic disorder: Secondary | ICD-10-CM

## 2019-06-08 DIAGNOSIS — F411 Generalized anxiety disorder: Secondary | ICD-10-CM

## 2019-06-08 DIAGNOSIS — F1020 Alcohol dependence, uncomplicated: Secondary | ICD-10-CM | POA: Diagnosis not present

## 2019-06-08 DIAGNOSIS — F330 Major depressive disorder, recurrent, mild: Secondary | ICD-10-CM

## 2019-06-08 MED ORDER — METHOCARBAMOL 750 MG PO TABS: 750.0000 mg | ORAL_TABLET | Freq: Three times a day (TID) | ORAL | 1 refills | Status: AC | PRN

## 2019-06-08 MED ORDER — HYDROXYZINE HCL 50 MG PO TABS: 50.0000 mg | ORAL_TABLET | Freq: Three times a day (TID) | ORAL | 0 refills | Status: DC | PRN

## 2019-06-08 MED ORDER — BUPROPION HCL ER (XL) 300 MG PO TB24: 300.0000 mg | ORAL_TABLET | Freq: Every day | ORAL | 0 refills | Status: DC
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
## 2019-06-18 ENCOUNTER — Other Ambulatory Visit: Payer: Self-pay | Admitting: Family Medicine

## 2019-06-26 ENCOUNTER — Other Ambulatory Visit (HOSPITAL_COMMUNITY): Payer: Self-pay | Admitting: Psychiatry

## 2019-06-28 ENCOUNTER — Other Ambulatory Visit (HOSPITAL_COMMUNITY): Payer: Self-pay | Admitting: Psychiatry

## 2019-06-28 MED ORDER — GABAPENTIN 100 MG PO CAPS: 100.0000 mg | ORAL_CAPSULE | Freq: Three times a day (TID) | ORAL | 1 refills | Status: DC

## 2019-07-08 ENCOUNTER — Other Ambulatory Visit: Payer: Self-pay

## 2019-07-08 ENCOUNTER — Ambulatory Visit (INDEPENDENT_AMBULATORY_CARE_PROVIDER_SITE_OTHER): Payer: Managed Care, Other (non HMO) | Admitting: Psychiatry

## 2019-07-08 DIAGNOSIS — F330 Major depressive disorder, recurrent, mild: Secondary | ICD-10-CM | POA: Diagnosis not present

## 2019-07-08 DIAGNOSIS — F411 Generalized anxiety disorder: Secondary | ICD-10-CM

## 2019-07-08 DIAGNOSIS — F4001 Agoraphobia with panic disorder: Secondary | ICD-10-CM | POA: Diagnosis not present

## 2019-07-08 DIAGNOSIS — F1020 Alcohol dependence, uncomplicated: Secondary | ICD-10-CM | POA: Diagnosis not present

## 2019-07-08 MED ORDER — NALTREXONE HCL 50 MG PO TABS: 50.0000 mg | ORAL_TABLET | Freq: Every day | ORAL | 2 refills | Status: AC

## 2019-07-08 MED ORDER — TOPIRAMATE 50 MG PO TABS: 50.0000 mg | ORAL_TABLET | Freq: Three times a day (TID) | ORAL | 2 refills | Status: DC
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
## 2019-07-21 ENCOUNTER — Other Ambulatory Visit (HOSPITAL_COMMUNITY): Payer: Self-pay | Admitting: Psychiatry

## 2019-07-30 ENCOUNTER — Encounter: Payer: Self-pay | Admitting: Family Medicine

## 2019-07-30 ENCOUNTER — Telehealth: Payer: Self-pay | Admitting: Family Medicine

## 2019-07-30 ENCOUNTER — Ambulatory Visit (INDEPENDENT_AMBULATORY_CARE_PROVIDER_SITE_OTHER): Payer: Managed Care, Other (non HMO) | Admitting: Family Medicine

## 2019-07-30 VITALS — Ht 66.0 in | Wt 160.0 lb

## 2019-07-30 DIAGNOSIS — J029 Acute pharyngitis, unspecified: Secondary | ICD-10-CM

## 2019-07-30 MED ORDER — AMOXICILLIN 875 MG PO TABS: 875.0000 mg | ORAL_TABLET | Freq: Two times a day (BID) | ORAL | 0 refills | Status: DC

## 2019-07-30 MED ORDER — FLUTICASONE PROPIONATE 50 MCG/ACT NA SUSP: 2.0000 | Freq: Every day | NASAL | 6 refills | Status: DC

## 2019-07-30 NOTE — Progress Notes (Signed)
Patient: Natasha Pope MRN: [id number] DOB: [birth year] PCP: Orland Mustard, MD     I connected with Natasha Pope on 07/30/19 at 9:41am by a video enabled telemedicine application and verified that I am speaking with the correct person using two identifiers.  Location patient: Home Location provider: [facility], Office Persons participating in this virtual visit: [name] and Dr. Artis Flock  I discussed the limitations of evaluation and management by telemedicine and the availability of in person appointments. The patient expressed understanding and agreed to proceed.   Subjective:  Chief Complaint  Patient presents with  . Sore Throat    symptoms x 4 days    HPI: The patient is a 24 y.o. female who presents today for sore throat and some sinus symptoms. She states her symptoms started 12/31 and states it got progressively worse. She has a sore throat and bilateral ear pain. She has neck tenderness and feels like her lymph nodes are swollen. She denies any strep throat or covid contacts. She denies any recent fevers, but states she had one about 11 days ago. She can swallow okay. She feels like she has strep throat.    Review of Systems  HENT: Positive for ear pain and sore throat. Negative for postnasal drip, rhinorrhea, sinus pressure, sinus pain and trouble swallowing.   Eyes: Negative for photophobia and pain.  Respiratory: Negative for cough and shortness of breath.   Cardiovascular: Negative for chest pain and palpitations.  Gastrointestinal: Positive for nausea and vomiting.  Musculoskeletal: Negative for myalgias and neck pain.  Neurological: Negative for dizziness and headaches.    Allergies Patient is allergic to ativan [lorazepam].  Past Medical History Patient  has a past medical history of Anxiety, Asthma, Depression, Pyelonephritis, and Sepsis (HCC).  Surgical History Patient  has a past surgical history that includes dental  procedure.  Family History [name]'s family history includes Alcohol abuse in her father, maternal grandfather, and paternal grandmother; Depression in her mother; Hyperlipidemia in her maternal grandmother; Hypertension in her father and paternal grandfather; Learning disabilities in her sister; Miscarriages / Stillbirths in her mother.  Social History Patient  reports that she quit smoking about 14 months ago. Her smoking use included cigarettes. She smoked 0.50 packs per day. She has never used smokeless tobacco. She reports current alcohol use. She reports that she does not use drugs.    Objective: Vitals:   07/30/19 0859  Weight: 160 lb (72.6 kg)  Height: 5\' 6"  (1.676 m)    Body mass index is 25.82 kg/m.  Physical Exam Constitutional:      General: She is not in acute distress.    Appearance: She is well-developed. She is not ill-appearing.  Eyes:     Conjunctiva/sclera: Conjunctivae normal.     Pupils: Pupils are equal, round, and reactive to light.  Musculoskeletal:     Cervical back: Normal range of motion.  Lymphadenopathy:     Cervical: Cervical adenopathy (per patient palpation ) present.  Neurological:     General: No focal deficit present.     Mental Status: She is alert.  Psychiatric:     Comments: Behavior a little odd. Unsure if drinking. She denies         Assessment/plan: 1. Sore throat Will treat with 10 day course of amoxicillin. Advised to throw away toothbrush after 24 hou. Push fluids and anti-pyretic medication prn for fever. Precautions given for tonsillar abscess/worsening symptoms and to return or go to ER. Side effects of medication  discussed and importance to completely full course of therapy.        Return if symptoms worsen or fail to improve.   Records requested if needed. Time spent with patient: 15 minutes, of which >50% was spent in obtaining information about her symptoms, reviweing her previous labs, evaluations, and treatments,  counseling her about her conditions (please see discussed topics above), and developing a plan to further investigate it; she had a number of questions which I addressed.    Orma Flaming, MD [place]  07/30/2019

## 2019-07-30 NOTE — Telephone Encounter (Signed)
[facility] - [place] TELEPHONE ADVICE RECORD AccessNurse Patient Name: Natasha Pope Gender: Female DOB: 1995/12/23 Age: 24 Y 8 M 1 D Return Phone Number: [phone] (Primary), [phone] (Secondary) Address[place] [address] Client [facility] - [place] Client Site [facility] Physician Orland Mustard- MD Contact Type Call Who Is Calling Patient / Member / Family / Caregiver Call Type Triage / Clinical Relationship To Patient Self Return Phone Number [phone] (Secondary) Chief Complaint Abdominal Pain Reason for Call Request to Schedule Office Appointment Initial Comment Caller states she has abdominal pain, body aches, a sore throat, and a headache. She thinks she has strep. Translation No Nurse Assessment Nurse: Lily Kocher, RN, Adriana Date/Time (Eastern Time): 07/29/2019 5:46:19 PM Confirm and document reason for call. If symptomatic, describe symptoms. ---pt states she has a sore throat, with irritation and pain with swallowing. onset 3 days ago. reports "severe body pains", and headaches. fevers, on the first day and no longer. Has the patient had close contact with a person known or suspected to have the novel coronavirus illness OR traveled / lives in area with major community spread (including international travel) in the last 14 days from the onset of symptoms? * If Asymptomatic, screen for exposure and travel within the last 14 days. ---No Does the patient have any new or worsening symptoms? ---Yes Will a triage be completed? ---Yes Related visit to physician within the last 2 weeks? ---No Does the PT have any chronic conditions? (i.e. diabetes, asthma, this includes High risk factors for pregnancy, etc.) ---Yes List chronic conditions. ---asthma Is the patient pregnant or possibly pregnant? (Ask all females between the ages of 27-55)  ---No Is this a behavioral health or substance abuse call? ---No Guidelines Guideline Title Affirmed Question Affirmed Notes Nurse Date/Time (Eastern Time) Coronavirus (COVID-19) - Diagnosed or Suspected Chest pain or pressure Lily Kocher, RN, Adriana 07/29/2019 5:52:46 PM PLEASE NOTE: All timestamps contained within this report are represented as [place] Standard Time. CONFIDENTIALTY NOTICE: This fax transmission is intended only for the addressee. It contains information that is legally privileged, confidential or otherwise protected from use or disclosure. If you are not the intended recipient, you are strictly prohibited from reviewing, disclosing, copying using or disseminating any of this information or taking any action in reliance on or regarding this information. If you have received this fax in error, please notify us immediately by telephone so that we can arrange for its return to [place]. Phone: [phone], Toll-Free: [phone], Fax: [phone] Page: 2 of 2 Call Id: [id number] Disp. Time Lamount Cohen Time) Disposition Final User 07/29/2019 5:13:04 PM Clinical Call Lily Kocher, RN, Adriana 07/29/2019 5:13:15 PM Attempt made - message left Lily Kocher RN, Adriana 07/29/2019 5:29:54 PM Attempt made - message left Maryelizabeth Rowan, Ricki Rodriguez 07/29/2019 5:56:56 PM Go to ED Now (or PCP triage) Yes Lily Kocher, RN, Adriana Caller Disagree/Comply Disagree Caller Understands Yes PreDisposition Call Doctor Care Advice Given Per Guideline GO TO ED NOW (OR PCP TRIAGE): * IF NO PCP (PRIMARY CARE PROVIDER) SECOND-LEVEL TRIAGE: You need to be seen within the next hour. Go to the ED/UCC at _____________ Hospital. Leave as soon as you can. CARE ADVICE given per CORONAVIRUS (COVID-19) - DIAGNOSED OR SUSPECTED (Adult) guideline. Comments User: Jerilynn Birkenhead, RN Date/Time Lamount Cohen Time): 07/29/2019 5:52:08 PM also reports vomiting and nausea User: Jerilynn Birkenhead, RN Date/Time Lamount Cohen Time): 07/29/2019 5:52:39 PM reports abdominal pain  generalized as well as upper back pain  User: Kizzie Fantasia, RN Date/Time Eilene Ghazi Time): 07/29/2019 5:57:38 PM pt wanted medication for strep called in and was upset that it was not called in Referrals [place] REFUSED

## 2019-08-13 ENCOUNTER — Other Ambulatory Visit: Payer: Self-pay

## 2019-08-13 ENCOUNTER — Ambulatory Visit (INDEPENDENT_AMBULATORY_CARE_PROVIDER_SITE_OTHER): Payer: Managed Care, Other (non HMO) | Admitting: Psychiatry

## 2019-08-13 DIAGNOSIS — F4001 Agoraphobia with panic disorder: Secondary | ICD-10-CM | POA: Diagnosis not present

## 2019-08-13 DIAGNOSIS — F411 Generalized anxiety disorder: Secondary | ICD-10-CM

## 2019-08-13 DIAGNOSIS — F330 Major depressive disorder, recurrent, mild: Secondary | ICD-10-CM | POA: Diagnosis not present

## 2019-08-13 DIAGNOSIS — F1421 Cocaine dependence, in remission: Secondary | ICD-10-CM | POA: Diagnosis not present

## 2019-08-13 MED ORDER — HYDROXYZINE HCL 50 MG PO TABS: 50.0000 mg | ORAL_TABLET | Freq: Three times a day (TID) | ORAL | 1 refills | Status: DC | PRN

## 2019-08-13 MED ORDER — METHOCARBAMOL 750 MG PO TABS: 750.0000 mg | ORAL_TABLET | Freq: Three times a day (TID) | ORAL | 2 refills | Status: AC | PRN

## 2019-08-13 MED ORDER — BUPROPION HCL ER (XL) 300 MG PO TB24: 300.0000 mg | ORAL_TABLET | Freq: Every day | ORAL | 1 refills | Status: DC

## 2019-08-13 MED ORDER — GABAPENTIN 100 MG PO CAPS: 100.0000 mg | ORAL_CAPSULE | Freq: Three times a day (TID) | ORAL | 1 refills | Status: DC

## 2019-08-13 MED ORDER — TRAZODONE HCL 100 MG PO TABS: 200.0000 mg | ORAL_TABLET | Freq: Every day | ORAL | 2 refills | Status: DC
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
## 2019-09-17 ENCOUNTER — Other Ambulatory Visit: Payer: Self-pay

## 2019-09-17 ENCOUNTER — Ambulatory Visit (HOSPITAL_COMMUNITY): Payer: 59 | Admitting: Psychiatry

## 2019-10-28 ENCOUNTER — Other Ambulatory Visit: Payer: Self-pay

## 2019-10-28 ENCOUNTER — Other Ambulatory Visit (HOSPITAL_COMMUNITY)
Admission: RE | Admit: 2019-10-28 | Discharge: 2019-10-28 | Disposition: A | Discharge status: Home / Self Care | Payer: 59 | Source: Ambulatory Visit | Attending: Family Medicine | Admitting: Family Medicine

## 2019-10-28 ENCOUNTER — Encounter: Payer: Self-pay | Admitting: Family Medicine

## 2019-10-28 ENCOUNTER — Ambulatory Visit (INDEPENDENT_AMBULATORY_CARE_PROVIDER_SITE_OTHER): Payer: 59 | Admitting: Family Medicine

## 2019-10-28 VITALS — BP 112/70 | HR 96 | Temp 98.0°F | Ht 66.0 in | Wt 133.4 lb

## 2019-10-28 DIAGNOSIS — N898 Other specified noninflammatory disorders of vagina: Secondary | ICD-10-CM

## 2019-10-28 DIAGNOSIS — Z30011 Encounter for initial prescription of contraceptive pills: Secondary | ICD-10-CM | POA: Diagnosis not present

## 2019-10-28 DIAGNOSIS — N760 Acute vaginitis: Secondary | ICD-10-CM | POA: Diagnosis not present

## 2019-10-28 DIAGNOSIS — B9689 Other specified bacterial agents as the cause of diseases classified elsewhere: Secondary | ICD-10-CM | POA: Diagnosis not present

## 2019-10-28 LAB — POCT URINE PREGNANCY: Preg Test, Ur: NEGATIVE

## 2019-10-28 MED ORDER — NORETHIN ACE-ETH ESTRAD-FE 1-20 MG-MCG PO TABS: 1.0000 | ORAL_TABLET | Freq: Every day | ORAL | 2 refills | Status: DC

## 2019-10-28 NOTE — Patient Instructions (Signed)
You have some bleeding and may be starting your period. If your urine pregnancy is negative I will send in your birth control pills. Start this Sunday. Hopefully it will help control the discharge.  Will also send culture to make sure no STD or bacterial infection causing discharge as well.   Make sure and take a prenatal vitamin. Talk to your psychiatrist as well.   Dr. Artis Flock

## 2019-10-28 NOTE — Progress Notes (Signed)
Patient: Natasha Pope MRN: [id number] DOB: 06-29-1996 PCP: Orland Mustard, MD     Subjective:  Chief Complaint  Patient presents with  . Vaginal Discharge    Pt says that she thinks that this is coming from her Birth Control.     HPI: The patient is a 24 y.o. female who presents today for Vaginal discharge. She states this started a few months ago. She thinks it clear in nature. She states it happens in her sleep and she wakes up and the bed is soaked with discharge. There is no odor to it. She states she is not peeing the bed. She is having sex. No pain with sex. She states sex feels good. She thinks her last period was 2 weeks ago, but doesn't seem sure about this. She started to bleed today. When asked if they use anything for birth control she said, "no." she states she is not trying to get pregnancy but not trying to prevent it. She also uses scented vaginal washes like, summers eve, daily.   Review of Systems  Constitutional: Negative for chills, fatigue and fever.  Respiratory: Negative for cough and shortness of breath.   Cardiovascular: Negative for chest pain, palpitations and leg swelling.  Gastrointestinal: Negative for nausea.  Genitourinary: Positive for vaginal bleeding and vaginal discharge. Negative for dyspareunia, frequency, hematuria, menstrual problem, pelvic pain and vaginal pain.    Allergies Patient is allergic to ativan [lorazepam].  Past Medical History Patient  has a past medical history of Anxiety, Asthma, Depression, Pyelonephritis, and Sepsis (HCC).  Surgical History Patient  has a past surgical history that includes dental procedure.  Family History [name]'s family history includes Alcohol abuse in her father, maternal grandfather, and paternal grandmother; Depression in her mother; Hyperlipidemia in her maternal grandmother; Hypertension in her father and paternal grandfather; Learning disabilities in her sister; Miscarriages / Stillbirths  in her mother.  Social History Patient  reports that she quit smoking about 17 months ago. Her smoking use included cigarettes. She smoked 0.50 packs per day. She has never used smokeless tobacco. She reports current alcohol use. She reports that she does not use drugs.    Objective: Vitals:   10/28/19 1444  BP: 112/70  Pulse: 96  Temp: 98 F (36.7 C)  TempSrc: Temporal  SpO2: 97%  Weight: 133 lb 6.4 oz (60.5 kg)  Height: 5\' 6"  (1.676 m)    Body mass index is 21.53 kg/m.  Physical Exam Vitals reviewed.  Constitutional:      Appearance: Normal appearance. She is normal weight.  HENT:     Head: Normocephalic and atraumatic.  Cardiovascular:     Rate and Rhythm: Normal rate and regular rhythm.     Heart sounds: Normal heart sounds.  Pulmonary:     Effort: Pulmonary effort is normal.     Breath sounds: Normal breath sounds.  Abdominal:     General: Abdomen is flat. Bowel sounds are normal.     Palpations: Abdomen is soft.  Genitourinary:    General: Normal vulva.     Vagina: Vaginal discharge present.     Comments: She has some bleeding in introitus/vault. Has a physiological discharge at cervical os. No smell, no TTP. Clear in color.  Musculoskeletal:     Cervical back: Normal range of motion and neck supple.  Neurological:     General: No focal deficit present.     Mental Status: She is alert.  Psychiatric:     Comments: Odd behavior. Not making  much sense   urine pregnancy: negative      Assessment/plan: 1. Vaginal discharge Checking STDs, appears physiological. Urine pregnancy negative. Starting her back on OCP to see if this helps. Also advised to stop all otc products with scents as this can be an irritant.  - POCT urine pregnancy - Cervicovaginal ancillary only( [place])  2. OCP (oral contraceptive pills) initiation Starting back her OCP. Discussed she can not get pregnant on all of these medicines that she is on and if wanting a baby she needs to talk  to her psychiatrist. Her mood/behavior was off today. Stated she is doing better, but admits to having a lunch drink today. Will start her on this and see her back in 3 months.    This visit occurred during the SARS-CoV-2 public health emergency.  Safety protocols were in place, including screening questions prior to the visit, additional usage of staff PPE, and extensive cleaning of exam room while observing appropriate contact time as indicated for disinfecting solutions.     Return in about 3 months (around 01/27/2020) for ocp f/u .    Orma Flaming, MD [place]   10/28/2019

## 2019-10-30 ENCOUNTER — Other Ambulatory Visit: Payer: Self-pay | Admitting: Family Medicine

## 2019-10-30 LAB — CERVICOVAGINAL ANCILLARY ONLY
Bacterial Vaginitis (gardnerella): POSITIVE — AB
Candida Glabrata: NEGATIVE
Candida Vaginitis: NEGATIVE
Chlamydia: NEGATIVE
Comment: NEGATIVE
Comment: NEGATIVE
Comment: NEGATIVE
Comment: NEGATIVE
Comment: NEGATIVE
Comment: NORMAL
Neisseria Gonorrhea: NEGATIVE
Trichomonas: NEGATIVE

## 2019-10-30 MED ORDER — METRONIDAZOLE 500 MG PO TABS: 500.0000 mg | ORAL_TABLET | Freq: Two times a day (BID) | ORAL | 0 refills | Status: DC

## 2019-11-02 ENCOUNTER — Telehealth: Payer: Self-pay | Admitting: Family Medicine

## 2019-11-02 NOTE — Telephone Encounter (Signed)
I spoke with the patient's grandmother, [name], who is on her DPR. She states that [name] told her that she was in "excellent health". She wanted to know if there was something she needed to help her with. I let her know that she came in for an acute visit. This was my first encounter with her, but she appeared to be all over the place. She even admitted to having a drink for lunch before coming for her appointment. Pt's grandmother was frustrated, but had no questions.

## 2019-11-02 NOTE — Telephone Encounter (Signed)
Grandmother called requesting a call back from Dr. Blair Heys assistant.  States that patient informed her that she was in good health.  States she would like to confirm this?

## 2019-11-09 ENCOUNTER — Other Ambulatory Visit (HOSPITAL_COMMUNITY): Payer: Self-pay | Admitting: Psychiatry

## 2019-11-16 ENCOUNTER — Encounter: Payer: 59 | Admitting: Family Medicine

## 2019-11-25 ENCOUNTER — Telehealth: Payer: Self-pay | Admitting: Family Medicine

## 2019-11-25 NOTE — Telephone Encounter (Signed)
Let her know that she needs to contact dr. Hinton Dyer for her refill of hydroxyzine. He is treating her anxiety and depression and  He last filled this on 11/09/2019. A little early to refill.   Thanks,  Dr. Artis Flock

## 2019-11-25 NOTE — Telephone Encounter (Signed)
  LAST APPOINTMENT DATE: 11/02/2019   NEXT APPOINTMENT DATE:@Visit  date not found  MEDICATION:hydrOXYzine (ATARAX/VISTARIL) 50 MG tablet  PHARMACY:[id number] - [place], [place] - [address]   **Let patient know to contact pharmacy at the end of the day to make sure medication is ready. **  ** Please notify patient to allow 48-72 hours to process**  **Encourage patient to contact the pharmacy for refills or they can request refills through [facility]**  CLINICAL FILLS OUT ALL BELOW:   LAST REFILL:  QTY:  REFILL DATE:    OTHER COMMENTS:    Okay for refill?  Please advise

## 2019-11-25 NOTE — Telephone Encounter (Signed)
Pt says that she does not understand why it cannot be filled by Dr Artis Flock. I explained that she is being treated by another Provider, so she needs to request those medications from him. I also relayed message below, she says that it is  already ready at the pharmacy.

## 2019-11-26 ENCOUNTER — Encounter (HOSPITAL_COMMUNITY): Payer: Self-pay | Admitting: Psychology

## 2019-12-02 NOTE — Progress Notes (Signed)
Natasha Pope is a 24 y.o. female patient who is discharged from counseling as last seen in tx over 90 days ago.        Forde Radon, [facility]

## 2019-12-10 ENCOUNTER — Other Ambulatory Visit (HOSPITAL_COMMUNITY): Payer: Self-pay | Admitting: Psychiatry

## 2019-12-12 ENCOUNTER — Emergency Department (HOSPITAL_COMMUNITY)
Admission: EM | Admit: 2019-12-12 | Discharge: 2019-12-12 | Disposition: A | Discharge status: Home / Self Care | Payer: 59 | Attending: Emergency Medicine | Admitting: Emergency Medicine

## 2019-12-12 ENCOUNTER — Other Ambulatory Visit: Payer: Self-pay

## 2019-12-12 DIAGNOSIS — J45909 Unspecified asthma, uncomplicated: Secondary | ICD-10-CM | POA: Diagnosis not present

## 2019-12-12 DIAGNOSIS — Z87891 Personal history of nicotine dependence: Secondary | ICD-10-CM | POA: Diagnosis not present

## 2019-12-12 DIAGNOSIS — F419 Anxiety disorder, unspecified: Secondary | ICD-10-CM | POA: Diagnosis not present

## 2019-12-12 MED ORDER — LORAZEPAM 1 MG PO TABS
1.0000 mg | ORAL_TABLET | Freq: Once | ORAL | Status: AC
  Administered 2019-12-12: 1 mg via ORAL
  Filled 2019-12-12: qty 1

## 2019-12-12 MED ORDER — HYDROXYZINE HCL 25 MG PO TABS
25.0000 mg | ORAL_TABLET | Freq: Four times a day (QID) | ORAL | 0 refills | Status: DC | PRN
Start: 2019-12-12 — End: 2019-12-14

## 2019-12-12 NOTE — Discharge Instructions (Addendum)
You were evaluated in the Emergency Department and after careful evaluation, we did not find any emergent condition requiring admission or further testing in the hospital.  Your exam/testing today was overall reassuring.  As discussed, please restart your medications and follow-up with your regular prescriber on Monday to discuss your dosing and whether or not you need them still.  For anxiety until that time, you can take the Atarax medication provided.  Please return to the Emergency Department if you experience any worsening of your condition.  We encourage you to follow up with a primary care provider.  Thank you for allowing [place] to be a part of your care.

## 2019-12-12 NOTE — ED Triage Notes (Signed)
Patient bib gcems, c/o anxiety attack. Patient has history of anxiety and etoh abuse. Patient reports no drink in two days. Patient states she hasn't had cymbalta in two days.

## 2019-12-12 NOTE — ED Provider Notes (Signed)
[facility] Emergency Department Provider Note MRN:  [id number]  Arrival date & time: 12/12/19     Chief Complaint   Anxiety History of Present Illness   Natasha Pope is a 24 y.o. year-old female with a history of anxiety presenting to the ED with chief complaint of anxiety.  Patient explains that she is tired of taking multiple medications every day, does not like the way they make her feel.  Try to quit all of her medications at once 2 days ago, now is feeling very anxious, shaky, unwell.  Denies any recent drinking, used to have a drinking problem.  Denies any pain, she is wondering if she is withdrawing or if she is having a panic attack.  Review of Systems  A complete 10 system review of systems was obtained and all systems are negative except as noted in the HPI and PMH.   Patient's Health History    Past Medical History:  Diagnosis Date  . Anxiety   . Asthma   . Depression   . Pyelonephritis   . Sepsis [facility])     Past Surgical History:  Procedure Laterality Date  . dental procedure      Family History  Problem Relation Age of Onset  . Depression Mother   . Miscarriages / [place] Mother   . Hypertension Father   . Alcohol abuse Father   . Learning disabilities Sister   . Hyperlipidemia Maternal Grandmother   . Alcohol abuse Maternal Grandfather   . Alcohol abuse Paternal Grandmother   . Hypertension Paternal Grandfather     Social History   Socioeconomic History  . Marital status: Single    Spouse name: Not on file  . Number of children: Not on file  . Years of education: Not on file  . Highest education level: Not on file  Occupational History  . Not on file  Tobacco Use  . Smoking status: Former Smoker    Packs/day: 0.50    Types: Cigarettes    Quit date: 05/10/2018    Years since quitting: 1.5  . Smokeless tobacco: Never Used  Substance and Sexual Activity  . Alcohol use: Yes    Comment: 2-3 days/week  .  Drug use: No  . Sexual activity: Not on file  Other Topics Concern  . Not on file  Social History Narrative  . Not on file   Social Determinants of Health   Financial Resource Strain:   . Difficulty of Paying Living Expenses:   Food Insecurity:   . Worried About Charity fundraiser in the Last Year:   . Arboriculturist in the Last Year:   Transportation Needs:   . Film/video editor (Medical):   Marland Kitchen Lack of Transportation (Non-Medical):   Physical Activity:   . Days of Exercise per Week:   . Minutes of Exercise per Session:   Stress:   . Feeling of Stress :   Social Connections:   . Frequency of Communication with Friends and Family:   . Frequency of Social Gatherings with Friends and Family:   . Attends Religious Services:   . Active Member of Clubs or Organizations:   . Attends Archivist Meetings:   Marland Kitchen Marital Status:   Intimate Partner Violence:   . Fear of Current or Ex-Partner:   . Emotionally Abused:   Marland Kitchen Physically Abused:   . Sexually Abused:      Physical Exam   Vitals:   12/12/19 1429  12/12/19 1442  BP: (!) 117/100 (!) 127/91  Pulse: (!) 108 100  Resp: 19 18  Temp: 98.7 F (37.1 C)   SpO2: 100% 100%    CONSTITUTIONAL: Anxious-appearing NEURO:  Alert and oriented x 3, no focal deficits EYES:  eyes equal and reactive ENT/NECK:  no LAD, no JVD CARDIO: Regular rate, well-perfused, normal S1 and S2 PULM:  CTAB no wheezing or rhonchi GI/GU:  normal bowel sounds, non-distended, non-tender MSK/SPINE:  No gross deformities, no edema SKIN:  no rash, atraumatic PSYCH: Anxious speech and behavior  *Additional and/or pertinent findings included in MDM below  Diagnostic and Interventional Summary    EKG Interpretation  Date/Time:    Ventricular Rate:    PR Interval:    QRS Duration:   QT Interval:    QTC Calculation:   R Axis:     Text Interpretation:        Labs Reviewed - No data to display  No orders to display    Medications    LORazepam (ATIVAN) tablet 1 mg (1 mg Oral Given 12/12/19 1510)     Procedures  /  Critical Care Procedures  ED Course and Medical Decision Making  I have reviewed the triage vital signs, the nursing notes, and pertinent available records from the EMR.  Listed above are laboratory and imaging tests that I personally ordered, reviewed, and interpreted and then considered in my medical decision making (see below for details).      Large component anxiety today possibly mixed with withdrawal from multiple medications.  Providing Ativan, will reassess.  Patient has follow-up on Monday with the regular prescriber, will likely advise her to restart all her medications and discuss all of her meds on Monday.  Patient is much more at ease after Ativan, normal vital signs, reassuring exam, appropriate for discharge with close follow-up on Monday.  Elmer Sow. Pilar Plate, MD [facility] Emergency Medicine [facility] [email or web address]  Final Clinical Impressions(s) / ED Diagnoses     ICD-10-CM   1. Anxiety  F41.9     ED Discharge Orders         Ordered    hydrOXYzine (ATARAX/VISTARIL) 25 MG tablet  Every 6 hours PRN     12/12/19 1624           Discharge Instructions Discussed with and Provided to Patient:     Discharge Instructions     You were evaluated in the Emergency Department and after careful evaluation, we did not find any emergent condition requiring admission or further testing in the hospital.  Your exam/testing today was overall reassuring.  As discussed, please restart your medications and follow-up with your regular prescriber on Monday to discuss your dosing and whether or not you need them still.  For anxiety until that time, you can take the Atarax medication provided.  Please return to the Emergency Department if you experience any worsening of your condition.  We encourage you to follow up with a primary care provider.  Thank you for allowing [place] to be  a part of your care.        Sabas Sous, MD 12/12/19 [phone]

## 2019-12-14 ENCOUNTER — Ambulatory Visit: Payer: 59 | Admitting: Family Medicine

## 2019-12-14 ENCOUNTER — Encounter: Payer: Self-pay | Admitting: Family Medicine

## 2019-12-14 ENCOUNTER — Other Ambulatory Visit: Payer: Self-pay

## 2019-12-14 VITALS — BP 128/88 | HR 107 | Temp 98.2°F | Ht 67.0 in | Wt 127.4 lb

## 2019-12-14 DIAGNOSIS — F411 Generalized anxiety disorder: Secondary | ICD-10-CM

## 2019-12-14 DIAGNOSIS — F1094 Alcohol use, unspecified with alcohol-induced mood disorder: Secondary | ICD-10-CM

## 2019-12-14 DIAGNOSIS — F339 Major depressive disorder, recurrent, unspecified: Secondary | ICD-10-CM

## 2019-12-14 MED ORDER — TOPIRAMATE 50 MG PO TABS: 50.0000 mg | ORAL_TABLET | Freq: Every day | ORAL | 0 refills | Status: DC

## 2019-12-14 MED ORDER — NALTREXONE HCL 50 MG PO TABS: 50.0000 mg | ORAL_TABLET | Freq: Every day | ORAL | 0 refills | Status: DC

## 2019-12-14 MED ORDER — HYDROXYZINE HCL 25 MG PO TABS: 25.0000 mg | ORAL_TABLET | Freq: Three times a day (TID) | ORAL | 1 refills | Status: DC

## 2019-12-14 MED ORDER — HYDROXYZINE HCL 25 MG PO TABS: 25.0000 mg | ORAL_TABLET | Freq: Four times a day (QID) | ORAL | 1 refills | Status: DC | PRN

## 2019-12-14 MED ORDER — BUPROPION HCL ER (XL) 150 MG PO TB24
150.0000 mg | ORAL_TABLET | Freq: Every day | ORAL | 0 refills | Status: DC
Start: 2019-12-14 — End: 2020-02-11

## 2019-12-14 NOTE — Progress Notes (Signed)
Patient: Natasha Pope MRN: [id number] DOB: [birth year] PCP: Orma Flaming, MD     Subjective:  Chief Complaint  Patient presents with  . Medication Problem    Discuss Meds    HPI: The patient is a 23 y.o. female who presents today to discuss medications. She is supposed to be seeing psychiatry, but has not followed up since January. He has her on numerous drugs and she stopped taking all of them cold [place] and then went to ER due to withdrawal. She called 911 for this and was taken to ER. ER work up was negative and she was given an ativan and discharged with hydroxyzine. She really likes the hydroxyzine and wants a refill of this. She tells me that   She is wanting to review her medication with me and see what she can possibly get off of. She doesn't like the way her gabapentin makes her feel and she wants to feel normal in a sense and not fuzzy in her mind on all of her medication. She feels like she has hit a turning point and is disappointed in her actions and is ready to make some life changes.   She has not drank in over 5 days.   Review of Systems  Respiratory: Negative for cough, shortness of breath and wheezing.   Cardiovascular: Negative for chest pain and palpitations.  Neurological: Negative for dizziness, light-headedness and headaches.  Psychiatric/Behavioral: The patient is nervous/anxious.     Allergies Patient is allergic to ativan [lorazepam].  Past Medical History Patient  has a past medical history of Anxiety, Asthma, Depression, Pyelonephritis, and Sepsis ([place]).  Surgical History Patient  has a past surgical history that includes dental procedure.  Family History [name]'s family history includes Alcohol abuse in her father, maternal grandfather, and paternal grandmother; Depression in her mother; Hyperlipidemia in her maternal grandmother; Hypertension in her father and paternal grandfather; Learning disabilities in her sister; Miscarriages /  Stillbirths in her mother.  Social History Patient  reports that she quit smoking about 19 months ago. Her smoking use included cigarettes. She smoked 0.50 packs per day. She has never used smokeless tobacco. She reports current alcohol use. She reports that she does not use drugs.    Objective: Vitals:   12/14/19 1426  BP: 128/88  Pulse: (!) 107  Temp: 98.2 F (36.8 C)  TempSrc: Temporal  SpO2: 99%  Weight: 127 lb 6.4 oz (57.8 kg)  Height: 5\' 7"  (1.702 m)    Body mass index is 19.95 kg/m.  Physical Exam Vitals reviewed.  Constitutional:      Appearance: Normal appearance.  HENT:     Head: Normocephalic and atraumatic.  Cardiovascular:     Rate and Rhythm: Normal rate.     Heart sounds: Normal heart sounds.  Pulmonary:     Effort: Pulmonary effort is normal.     Breath sounds: Normal breath sounds.  Abdominal:     General: Abdomen is flat. Bowel sounds are normal.     Palpations: Abdomen is soft.  Neurological:     General: No focal deficit present.     Mental Status: She is alert and oriented to person, place, and time.  Psychiatric:        Mood and Affect: Mood normal.        Behavior: Behavior normal.     Comments: No si/hi/ah/vh       Office Visit from 12/14/2019 in [facility]  PHQ-2 Total Score  2  Assessment/plan: 1. Alcohol use with alcohol-induced mood disorder (HCC) Will continue naltrexone, but discussed I prefer her psychiatrist manage this. 30 days sent in.   2. Episode of recurrent major depressive disorder, unspecified depression episode severity (HCC) She does not like wellbutrin, but we will continue cymbalta. We will just cut back on the wellbutrin.   3. GAD (generalized anxiety disorder) Likes the hydroxyzine. I have given this to her in the past, but she doesn't recall this. Refills given. Continue cymbalta. She does not like the gabapentin and we will titrate her off of this. Also discussed that I do think  she needs to follow up with her psychiatrist and will forward him on this.   Went through all of her medication and cleaned up her list of what she is taking and not taking and what we can stop. Had conversation about need to be controlled on the right amount of medication. She is open to taking medicine, but just doesn't want to feel groggy or fuzzy.   Return in about 1 month (around 01/14/2020) for annual/fasting labs/med follow up .    Orland Mustard, MD [place] [facility]   12/14/2019

## 2019-12-14 NOTE — Patient Instructions (Addendum)
1) stop the gabapentin. You can do 2/times a day for a week then one/day for a week then stop 2) we are going to decrease the wellbutrin down to 150mg . I sent in a new px for you. If you do well, we can even wean this down more.  3) continue cymbalta 4) hydroxyzine as needed for anxiety-new px sent in for you.  5) continue naltrexone 6) the muscle relaxer you can do the same as the gabapentin. Wean down to 2x/day for a week then 1x/day for a week or you can go every other day then stop   im going to email the psychiatrist as well because he may want to see you again.  i'll see you back in one month for recheck to see how you are doing. Let's do your annual at that time and we can check your labs etc.     Very proud of you!  Dr. .

## 2019-12-15 ENCOUNTER — Encounter: Payer: Self-pay | Admitting: Family Medicine

## 2019-12-15 ENCOUNTER — Telehealth (INDEPENDENT_AMBULATORY_CARE_PROVIDER_SITE_OTHER): Payer: 59 | Admitting: Family Medicine

## 2019-12-15 VITALS — HR 102 | Temp 98.2°F | Ht 66.0 in | Wt 127.0 lb

## 2019-12-15 DIAGNOSIS — L989 Disorder of the skin and subcutaneous tissue, unspecified: Secondary | ICD-10-CM | POA: Diagnosis not present

## 2019-12-15 NOTE — Progress Notes (Signed)
Virtual Visit via Video Note  I connected with Natasha Pope  on 12/15/19 at  5:00 PM EDT by a video enabled telemedicine application and verified that I am speaking with the correct person using two identifiers.  Location patient: home, [place] Location provider:work or home office Persons participating in the virtual visit: patient, provider  I discussed the limitations of evaluation and management by telemedicine and the availability of in person appointments. The patient expressed understanding and agreed to proceed.   HPI:  Acute visit for a possible spider bite: -she noticed two dots on her wrist 3 days ago -she squeezed some juicy stuff out of it, she called her grandma, it was a little itchy -grandma thought it may be a spider bite -she puts some topical antibiotic on it -no further pus -symptoms now include mild itchiness, some hyperpig of skin around the two dots -denies snake bite, seeing anything bite her, further swelling or discharge, much pain - mildly tender if pressure on this area, fevers, malaise, streaking   ROS: See pertinent positives and negatives per HPI.  Past Medical History:  Diagnosis Date  . Anxiety   . Asthma   . Depression   . Pyelonephritis   . Sepsis [facility])     Past Surgical History:  Procedure Laterality Date  . dental procedure      Family History  Problem Relation Age of Onset  . Depression Mother   . Miscarriages / [place] Mother   . Hypertension Father   . Alcohol abuse Father   . Learning disabilities Sister   . Hyperlipidemia Maternal Grandmother   . Alcohol abuse Maternal Grandfather   . Alcohol abuse Paternal Grandmother   . Hypertension Paternal Grandfather     SOCIAL HX: see hpi   Current Outpatient Medications:  .  buPROPion (WELLBUTRIN XL) 150 MG 24 hr tablet, Take 1 tablet (150 mg total) by mouth daily., Disp: 90 tablet, Rfl: 0 .  DULoxetine (CYMBALTA) 60 MG capsule, Take 1 capsule (60 mg total) by mouth daily., Disp: 90  capsule, Rfl: 2 .  fluticasone (FLONASE) 50 MCG/ACT nasal spray, Place 2 sprays into both nostrils daily., Disp: 16 g, Rfl: 6 .  hydrOXYzine (ATARAX/VISTARIL) 25 MG tablet, Take 1 tablet (25 mg total) by mouth 3 (three) times daily., Disp: 90 tablet, Rfl: 1 .  ibuprofen (ADVIL) 200 MG tablet, Take 1-2 tablets (200-400 mg total) by mouth 3 (three) times daily as needed for headache or moderate pain., Disp: 30 tablet, Rfl: 0 .  naltrexone (DEPADE) 50 MG tablet, Take 1 tablet (50 mg total) by mouth daily., Disp: 30 tablet, Rfl: 0 .  norethindrone-ethinyl estradiol (LOESTRIN FE) 1-20 MG-MCG tablet, Take 1 tablet by mouth daily., Disp: 3 Package, Rfl: 2 .  topiramate (TOPAMAX) 50 MG tablet, Take 1 tablet (50 mg total) by mouth daily., Disp: 90 tablet, Rfl: 0 .  traZODone (DESYREL) 100 MG tablet, Take 2 tablets (200 mg total) by mouth at bedtime., Disp: 60 tablet, Rfl: 2  EXAM:  VITALS per patient if applicable:  GENERAL: alert, oriented, appears well and in no acute distress  HEENT: atraumatic, conjunttiva clear, no obvious abnormalities on inspection of external nose and ears  NECK: normal movements of the head and neck  LUNGS: on inspection no signs of respiratory distress, breathing rate appears normal, no obvious gross SOB, gasping or wheezing  CV: no obvious cyanosis  SKIN: two small dots on wrist with minimal surrounding hyperpigmentation, does not appear to have necrosis, purulence, discharge, edema  MS: moves all visible extremities without noticeable abnormality  PSYCH/NEURO: pleasant and cooperative, no obvious depression or anxiety, speech and thought processing grossly intact  ASSESSMENT AND PLAN:  Discussed the following assessment and plan:  Skin lesion  -we discussed possible serious and likely etiologies, options for evaluation and workup, limitations of telemedicine visit vs in person visit, treatment, treatment risks and precautions. Pt prefers to treat via  telemedicine empirically rather then risking or undertaking an in person visit at this moment. Query spider bite with minimal local reaction vs other. Discussed possibility of infection, but does not appear to be infected today. Discussed spider bites and management. Opted for topical abx ointment. Patient agrees to seek prompt in person care if any worsening, new symptoms arise, or if is not improving with treatment. She is out of date on her tetanus shot. Advised to get this done. She agrees to go to the pharmacy for this tomorrow.   I discussed the assessment and treatment plan with the patient. The patient was provided an opportunity to ask questions and all were answered. The patient agreed with the plan and demonstrated an understanding of the instructions.   The patient was advised to call back or seek an in-person evaluation if the symptoms worsen or if the condition fails to improve as anticipated.   Terressa Koyanagi, DO

## 2019-12-26 ENCOUNTER — Other Ambulatory Visit: Payer: Self-pay

## 2019-12-26 ENCOUNTER — Encounter (HOSPITAL_BASED_OUTPATIENT_CLINIC_OR_DEPARTMENT_OTHER): Payer: Self-pay | Admitting: Emergency Medicine

## 2019-12-26 ENCOUNTER — Emergency Department (HOSPITAL_BASED_OUTPATIENT_CLINIC_OR_DEPARTMENT_OTHER)
Admission: EM | Admit: 2019-12-26 | Discharge: 2019-12-27 | Disposition: A | Discharge status: Home / Self Care | Payer: 59 | Attending: Emergency Medicine | Admitting: Emergency Medicine

## 2019-12-26 DIAGNOSIS — R198 Other specified symptoms and signs involving the digestive system and abdomen: Secondary | ICD-10-CM

## 2019-12-26 DIAGNOSIS — R093 Abnormal sputum: Secondary | ICD-10-CM | POA: Diagnosis present

## 2019-12-26 DIAGNOSIS — J45909 Unspecified asthma, uncomplicated: Secondary | ICD-10-CM | POA: Insufficient documentation

## 2019-12-26 DIAGNOSIS — F458 Other somatoform disorders: Secondary | ICD-10-CM | POA: Insufficient documentation

## 2019-12-26 DIAGNOSIS — R0989 Other specified symptoms and signs involving the circulatory and respiratory systems: Secondary | ICD-10-CM

## 2019-12-26 DIAGNOSIS — F419 Anxiety disorder, unspecified: Secondary | ICD-10-CM | POA: Insufficient documentation

## 2019-12-26 NOTE — ED Triage Notes (Addendum)
Pt reports sensation of mucous in throat, pulling at shoulders and back. Hx anxiety. States "am I gonna die." Putting her fingers down her throat in triage.

## 2019-12-27 ENCOUNTER — Emergency Department (HOSPITAL_BASED_OUTPATIENT_CLINIC_OR_DEPARTMENT_OTHER): Payer: 59

## 2019-12-27 MED ORDER — LORAZEPAM 1 MG PO TABS
1.0000 mg | ORAL_TABLET | Freq: Once | ORAL | Status: AC
  Administered 2019-12-27: 1 mg via ORAL
  Filled 2019-12-27: qty 1

## 2019-12-27 NOTE — ED Provider Notes (Signed)
[facility] EMERGENCY DEPARTMENT Provider Note   CSN: [id number] Arrival date & time: 12/26/19  2104     History Chief Complaint  Patient presents with  . mucous in throat, anxiety    psych exam    Natasha Pope is a 24 y.o. female.  Patient presents to the emergency department stating that there is some type of fullness or object that initially was in her back and now feels like it is in her throat causing her to have trouble breathing.        Past Medical History:  Diagnosis Date  . Anxiety   . Asthma   . Depression   . Pyelonephritis   . Sepsis [facility])     Patient Active Problem List   Diagnosis Date Noted  . Cocaine use disorder, moderate, in early remission (HCC) 08/13/2019  . 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/2019  . 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 Alcohol withdrawal (HCC) 01/10/2019  . Alcohol use disorder, severe, dependence (HCC) 12/27/2018  . Recurrent major depressive disorder (HCC) 12/27/2018  . Seizure (HCC) 12/26/2018  . Alcohol use with alcohol-induced mood disorder (HCC)   . Substance induced mood disorder (HCC) 12/09/2018  . Macrocytic anemia 05/19/2018  . Thrombocytopenia (HCC) 05/19/2018  . ASCUS of cervix with negative high risk HPV 03/07/2018  . Tobacco abuse 01/31/2018  . Asthma 01/31/2018  . GAD (generalized anxiety disorder) 01/31/2018    Past Surgical History:  Procedure Laterality Date  . dental procedure       OB History   No obstetric history on file.     Family History  Problem Relation Age of Onset  . Depression Mother   . Miscarriages / [place] Mother   . Hypertension Father   . Alcohol abuse Father   . Learning disabilities Sister   . Hyperlipidemia Maternal Grandmother   . Alcohol abuse Maternal Grandfather   . Alcohol abuse Paternal Grandmother   . Hypertension Paternal Grandfather     Social History   Tobacco Use  . Smoking status:  Former Smoker    Packs/day: 0.50    Types: Cigarettes    Quit date: 05/10/2018    Years since quitting: 1.6  . Smokeless tobacco: Never Used  Substance Use Topics  . Alcohol use: Yes    Comment: 2-3 days/week  . Drug use: No    Home Medications Prior to Admission medications   Medication Sig Start Date End Date Taking? Authorizing Provider  buPROPion (WELLBUTRIN XL) 150 MG 24 hr tablet Take 1 tablet (150 mg total) by mouth daily. 12/14/19   Orland Mustard, MD  DULoxetine (CYMBALTA) 60 MG capsule Take 1 capsule (60 mg total) by mouth daily. 01/21/19   Malvin Johns, MD  fluticasone (FLONASE) 50 MCG/ACT nasal spray Place 2 sprays into both nostrils daily. 07/30/19   Orland Mustard, MD  hydrOXYzine (ATARAX/VISTARIL) 25 MG tablet Take 1 tablet (25 mg total) by mouth 3 (three) times daily. 12/14/19   Orland Mustard, MD  ibuprofen (ADVIL) 200 MG tablet Take 1-2 tablets (200-400 mg total) by mouth 3 (three) times daily as needed for headache or moderate pain. 01/16/19   Briant Cedar, MD  naltrexone (DEPADE) 50 MG tablet Take 1 tablet (50 mg total) by mouth daily. 12/14/19   Orland Mustard, MD  norethindrone-ethinyl estradiol (LOESTRIN FE) 1-20 MG-MCG tablet Take 1 tablet by mouth daily. 10/28/19   Orland Mustard, MD  topiramate (TOPAMAX) 50 MG tablet Take 1 tablet (  50 mg total) by mouth daily. 12/14/19   Orland Mustard, MD  traZODone (DESYREL) 100 MG tablet Take 2 tablets (200 mg total) by mouth at bedtime. 08/13/19   Pucilowski, Roosvelt Maser, MD    Allergies    Patient has no known allergies.  Review of Systems   Review of Systems  HENT: Positive for trouble swallowing.   All other systems reviewed and are negative.   Physical Exam Updated Vital Signs BP 123/82   Pulse 74   Temp 97.9 F (36.6 C) (Oral)   Resp 16   Ht 5\' 7"  (1.702 m)   Wt 63.5 kg   LMP 12/25/2019 (Approximate)   SpO2 100%   BMI 21.93 kg/m   Physical Exam Vitals and nursing note reviewed.  Constitutional:       General: She is not in acute distress.    Appearance: Normal appearance. She is well-developed.  HENT:     Head: Normocephalic and atraumatic.     Right Ear: Hearing normal.     Left Ear: Hearing normal.     Nose: Nose normal.  Eyes:     Conjunctiva/sclera: Conjunctivae normal.     Pupils: Pupils are equal, round, and reactive to light.  Cardiovascular:     Rate and Rhythm: Regular rhythm.     Heart sounds: S1 normal and S2 normal. No murmur. No friction rub. No gallop.   Pulmonary:     Effort: Pulmonary effort is normal. No respiratory distress.     Breath sounds: Normal breath sounds.  Chest:     Chest wall: No tenderness.  Abdominal:     General: Bowel sounds are normal.     Palpations: Abdomen is soft.     Tenderness: There is no abdominal tenderness. There is no guarding or rebound. Negative signs include Murphy's sign and McBurney's sign.     Hernia: No hernia is present.  Musculoskeletal:        General: Normal range of motion.     Cervical back: Normal range of motion and neck supple.  Skin:    General: Skin is warm and dry.     Findings: No rash.  Neurological:     Mental Status: She is alert and oriented to person, place, and time.     GCS: GCS eye subscore is 4. GCS verbal subscore is 5. GCS motor subscore is 6.     Cranial Nerves: No cranial nerve deficit.     Sensory: No sensory deficit.     Coordination: Coordination normal.  Psychiatric:        Attention and Perception: She is inattentive.        Mood and Affect: Affect is inappropriate.        Speech: Speech normal.        Behavior: Behavior is agitated.        Thought Content: Thought content is delusional.     ED Results / Procedures / Treatments   Labs (all labs ordered are listed, but only abnormal results are displayed) Labs Reviewed - No data to display  EKG None  Radiology DG Neck Soft Tissue  Result Date: 12/27/2019 CLINICAL DATA:  Sensation of throat swelling, globus sensation EXAM: NECK  SOFT TISSUES - 1+ VIEW COMPARISON:  None. FINDINGS: Nonspecific thickening of the aryepiglottic folds with shouldering of the upper airway on frontal radiograph. The airway appears otherwise patent on lateral few. No retropharyngeal soft tissue thickening or gas. The epiglottis remains sharp and well-defined. No radiopaque foreign body.  Slight reversal the normal cervical lordosis may be positional. IMPRESSION: Nonspecific thickening of the aryepiglottic folds with shouldering of the upper airway on frontal radiograph. Nonspecific but possibly reactive. Electronically Signed   By: Lovena Le M.D.   On: 12/27/2019 03:01    Procedures Procedures (including critical care time)  Medications Ordered in ED Medications - No data to display  ED Course  I have reviewed the triage vital signs and the nursing notes.  Pertinent labs & imaging results that were available during my care of the patient were reviewed by me and considered in my medical decision making (see chart for details).    MDM Rules/Calculators/A&P                      Patient presents to the emergency department for evaluation of multiple sensations.  She reports that she feels like there was some type of fullness or may be even a "ball" that moved from her shoulder to her throat.  She appears very anxious.  She does have a history of anxiety and depression.  She denies any sensation of suicidality and homicidality.  Patient keeps stating that something inside her has to come out.  She thinks that it might be the devil.  I did suggest to her that she has a lot of anxiety and might want to talk to someone from a behavioral health standpoint.  She reports that she does have a psychiatrist and family members that she talks to and does not want to speak to behavioral health at this time.  She would like to sleep here until the morning.  We will continue to monitor.   Final Clinical Impression(s) / ED Diagnoses Final diagnoses:  Globus  sensation  Anxiety    Rx / DC Orders ED Discharge Orders    None       Slate Debroux, Gwenyth Allegra, MD 12/27/19 [phone]

## 2019-12-27 NOTE — ED Notes (Signed)
Pt states ride is 10 min away

## 2019-12-27 NOTE — ED Notes (Signed)
Pt requesting "something for anxiety", states her plan is to [place] home. Pt informed that she would need a ride home from a friend or family member if any medications given would alter her. Pt states she can arrange a ride with a friend, will call once arrangements are made. EDP aware of plan.

## 2019-12-27 NOTE — ED Notes (Signed)
Pt reports that "a ball of fat started in my back, moved here *indicated right shoulder*, now it's sitting in my throat." Pt states that "when I try and poop it feels like it is trying to come out of my asshole, but when I stop pushing it goes back up to my throat." Pt also states "It feels like snot.". Pt reports feeling like she can't breathe, but does not show signs of SHOB or respiratory distress. O2 sat is 100% on room air and respirations were 17

## 2019-12-27 NOTE — ED Notes (Signed)
Attempted to leave pt on the monitor while pt was sleeping; pt refused

## 2020-01-05 ENCOUNTER — Telehealth: Payer: Self-pay | Admitting: Family Medicine

## 2020-01-05 NOTE — Telephone Encounter (Signed)
Advised patient per PCP to go to ER. Patient kept asking if she was dying. Denies trying any new foods or medications, but was eating trail mix when we talked. Did not mention extremity swelling when I spoke to her. Mentioned triage nurse advised her to be seen immediately at ER or UC. Agreeable to going to UC or ER.

## 2020-01-05 NOTE — Telephone Encounter (Signed)
Pt called stating she has a rash on her face and neck. Pt states she has cuts on her tongue and is pulling hair out of her mouth and is experiencing shortness of breath. Transferred pt to [facility] to get triaged.

## 2020-01-05 NOTE — Telephone Encounter (Signed)
FYI

## 2020-01-05 NOTE — Telephone Encounter (Signed)
Patient needs to schedule a follow up with her psychiatrist. She has been to ER for psych related behavior as is this episode. I discussed with her at last visit that she needs to continue to see them. Please make sure she follows up with them as she is not doing well off medication.  Thanks, Dr. Artis Flock

## 2020-01-05 NOTE — Telephone Encounter (Signed)
Patient's appt has been scheduled.

## 2020-01-05 NOTE — Telephone Encounter (Signed)
Please cancel appointment. Thank You

## 2020-01-05 NOTE — Telephone Encounter (Signed)
Pt has been advised to go to ED. She is also scheduled for 01/07/2020.

## 2020-01-05 NOTE — Telephone Encounter (Signed)
Nurse Assessment Nurse: Natasha Hertz, RN, Natasha Pope Date/Time Natasha Pope Time): 01/05/2020 1:53:32 PM Confirm and document reason for call. If symptomatic, describe symptoms. ---Caller states she has a rash on her face and neck. Caller states she is pulling hair out of her mouth, and having shortness breathe. Caller has been seen in the ER two days ago and was told she is having an anxiety attack. Now she has swollen feet and legs. Has the patient had close contact with a person known or suspected to have the novel coronavirus illness OR traveled / lives in area with major community spread (including international travel) in the last 14 days from the onset of symptoms? * If Asymptomatic, screen for exposure and travel within the last 14 days. ---No Does the patient have any new or worsening symptoms? ---Yes Will a triage be completed? ---Yes Related visit to physician within the last 2 weeks? ---Yes Does the PT have any chronic conditions? (i.e. diabetes, asthma, this includes High risk factors for pregnancy, etc.) ---Yes List chronic conditions. ---asthma Is the patient pregnant or possibly pregnant? (Ask all females between the ages of 41-55) ---No Is this a behavioral health or substance abuse call? ---NoPLEASE NOTE: All timestamps contained within this report are represented as [place] Standard Time. CONFIDENTIALTY NOTICE: This fax transmission is intended only for the addressee. It contains information that is legally privileged, confidential or otherwise protected from use or disclosure. If you are not the intended recipient, you are strictly prohibited from reviewing, disclosing, copying using or disseminating any of this information or taking any action in reliance on or regarding this information. If you have received this fax in error, please notify us immediately by telephone so that we can arrange for its return to [place]. Phone: [phone], Toll-Free: [phone], Fax:  [phone] Page: 2 of 2 Call Id: [id number] Guidelines Guideline Title Affirmed Question Affirmed Notes Nurse Date/Time Natasha Pope Time) Rash or Redness - Localized [1] Looks infected (spreading redness, pus) AND [2] large red area (> 2 in. or 5 cm) Natasha Hertz, RN, Natasha Pope 01/05/2020 1:59:22 PM Disp. Time Natasha Pope Time) Disposition Final User 01/05/2020 1:50:52 PM Send to Urgent Natasha Pope 01/05/2020 2:02:01 PM See HCP within 4 Hours (or PCP triage) Yes Natasha Hertz, RN, Natasha Pope Disagree/Comply Comply Caller Understands Yes PreDisposition Did not know what to do Care Advice Given Per Guideline SEE HCP WITHIN 4 HOURS (OR PCP TRIAGE): CALL BACK IF: * You become worse. Comments User: Natasha Bee, RN Date/Time (Eastern Time): 01/05/2020 2:09:56 PM Caller states she had something come out of her ear which had a foul odor and it was like something infected. Also has rash on face which she thinks is infected. She has been "pulling hair out of her mouth" She said the ED gave her medicine for anxiety. She is very distraught and feels no one will listen to her. She states she "feels very bad and does not understand why and she wants it fixed." Referrals REFERRED TO PCP OFFICE Warm transfer to backline

## 2020-01-05 NOTE — Telephone Encounter (Signed)
Needs to go to ER.  Orland Mustard, MD [facility]

## 2020-01-07 ENCOUNTER — Ambulatory Visit: Payer: 59 | Admitting: Family Medicine

## 2020-01-10 ENCOUNTER — Encounter (HOSPITAL_COMMUNITY): Payer: Self-pay | Admitting: Emergency Medicine

## 2020-01-10 ENCOUNTER — Emergency Department (HOSPITAL_COMMUNITY)
Admission: EM | Admit: 2020-01-10 | Discharge: 2020-01-11 | Disposition: A | Discharge status: Home / Self Care | Payer: 59 | Attending: Emergency Medicine | Admitting: Emergency Medicine

## 2020-01-10 ENCOUNTER — Other Ambulatory Visit: Payer: Self-pay

## 2020-01-10 DIAGNOSIS — F1290 Cannabis use, unspecified, uncomplicated: Secondary | ICD-10-CM | POA: Insufficient documentation

## 2020-01-10 DIAGNOSIS — F22 Delusional disorders: Secondary | ICD-10-CM | POA: Diagnosis present

## 2020-01-10 DIAGNOSIS — Z20822 Contact with and (suspected) exposure to covid-19: Secondary | ICD-10-CM | POA: Diagnosis not present

## 2020-01-10 DIAGNOSIS — J45909 Unspecified asthma, uncomplicated: Secondary | ICD-10-CM | POA: Insufficient documentation

## 2020-01-10 DIAGNOSIS — E876 Hypokalemia: Secondary | ICD-10-CM

## 2020-01-10 DIAGNOSIS — R442 Other hallucinations: Secondary | ICD-10-CM | POA: Diagnosis present

## 2020-01-10 DIAGNOSIS — F12959 Cannabis use, unspecified with psychotic disorder, unspecified: Secondary | ICD-10-CM | POA: Diagnosis present

## 2020-01-10 DIAGNOSIS — F1994 Other psychoactive substance use, unspecified with psychoactive substance-induced mood disorder: Secondary | ICD-10-CM | POA: Diagnosis present

## 2020-01-10 DIAGNOSIS — Z87891 Personal history of nicotine dependence: Secondary | ICD-10-CM | POA: Diagnosis not present

## 2020-01-10 DIAGNOSIS — F419 Anxiety disorder, unspecified: Secondary | ICD-10-CM

## 2020-01-10 LAB — CBC
HCT: 37.6 % (ref 36.0–46.0)
Hemoglobin: 12.6 g/dL (ref 12.0–15.0)
MCH: 34.6 pg — ABNORMAL HIGH (ref 26.0–34.0)
MCHC: 33.5 g/dL (ref 30.0–36.0)
MCV: 103.3 fL — ABNORMAL HIGH (ref 80.0–100.0)
Platelets: 247 10*3/uL (ref 150–400)
RBC: 3.64 MIL/uL — ABNORMAL LOW (ref 3.87–5.11)
RDW: 13 % (ref 11.5–15.5)
WBC: 9.5 10*3/uL (ref 4.0–10.5)
nRBC: 0 % (ref 0.0–0.2)

## 2020-01-10 LAB — COMPREHENSIVE METABOLIC PANEL
ALT: 19 U/L (ref 0–44)
AST: 32 U/L (ref 15–41)
Albumin: 4.3 g/dL (ref 3.5–5.0)
Alkaline Phosphatase: 58 U/L (ref 38–126)
Anion gap: 15 (ref 5–15)
BUN: 7 mg/dL (ref 6–20)
CO2: 23 mmol/L (ref 22–32)
Calcium: 9.1 mg/dL (ref 8.9–10.3)
Chloride: 104 mmol/L (ref 98–111)
Creatinine, Ser: 0.62 mg/dL (ref 0.44–1.00)
GFR calc Af Amer: >60 mL/min (ref >60)
GFR calc non Af Amer: >60 mL/min (ref >60)
Glucose, Bld: 73 mg/dL (ref 70–99)
Potassium: 2.7 mmol/L — CL (ref 3.5–5.1)
Sodium: 142 mmol/L (ref 135–145)
Total Bilirubin: 1.1 mg/dL (ref 0.3–1.2)
Total Protein: 7.2 g/dL (ref 6.5–8.1)

## 2020-01-10 LAB — RAPID URINE DRUG SCREEN, HOSP PERFORMED
Amphetamines: NOT DETECTED
Barbiturates: NOT DETECTED
Benzodiazepines: NOT DETECTED
Cocaine: NOT DETECTED
Opiates: NOT DETECTED
Tetrahydrocannabinol: POSITIVE — AB

## 2020-01-10 LAB — ETHANOL: Alcohol, Ethyl (B): <10 mg/dL (ref <10)

## 2020-01-10 LAB — I-STAT BETA HCG BLOOD, ED (MC, WL, AP ONLY): I-stat hCG, quantitative: <5 m[IU]/mL (ref <5)

## 2020-01-10 LAB — MAGNESIUM: Magnesium: 2 mg/dL (ref 1.7–2.4)

## 2020-01-10 MED ORDER — POTASSIUM CHLORIDE CRYS ER 20 MEQ PO TBCR
20.0000 meq | EXTENDED_RELEASE_TABLET | Freq: Two times a day (BID) | ORAL | Status: DC
  Administered 2020-01-11 (×2): 20 meq via ORAL
  Filled 2020-01-10 (×2): qty 1

## 2020-01-10 MED ORDER — POTASSIUM CHLORIDE CRYS ER 20 MEQ PO TBCR
40.0000 meq | EXTENDED_RELEASE_TABLET | Freq: Once | ORAL | Status: AC
  Administered 2020-01-11: 40 meq via ORAL
  Filled 2020-01-10: qty 2

## 2020-01-10 MED ORDER — BUPROPION HCL ER (XL) 150 MG PO TB24
150.0000 mg | ORAL_TABLET | Freq: Every day | ORAL | Status: DC
  Administered 2020-01-11 (×2): 150 mg via ORAL
  Filled 2020-01-10 (×2): qty 1

## 2020-01-10 MED ORDER — TRAZODONE HCL 100 MG PO TABS
200.0000 mg | ORAL_TABLET | Freq: Every day | ORAL | Status: DC
  Administered 2020-01-11: 200 mg via ORAL
  Filled 2020-01-10: qty 2

## 2020-01-10 MED ORDER — LORAZEPAM 1 MG PO TABS
1.0000 mg | ORAL_TABLET | Freq: Once | ORAL | Status: DC
  Filled 2020-01-10: qty 1

## 2020-01-10 MED ORDER — DULOXETINE HCL 30 MG PO CPEP
60.0000 mg | ORAL_CAPSULE | Freq: Every day | ORAL | Status: DC
  Administered 2020-01-11 (×2): 60 mg via ORAL
  Filled 2020-01-10 (×2): qty 2

## 2020-01-10 MED ORDER — TOPIRAMATE 25 MG PO TABS
50.0000 mg | ORAL_TABLET | Freq: Every day | ORAL | Status: DC
  Administered 2020-01-11 (×2): 50 mg via ORAL
  Filled 2020-01-10 (×2): qty 2

## 2020-01-10 MED ORDER — HYDROXYZINE HCL 25 MG PO TABS
25.0000 mg | ORAL_TABLET | Freq: Three times a day (TID) | ORAL | Status: DC
  Administered 2020-01-11 (×4): 25 mg via ORAL
  Filled 2020-01-10 (×4): qty 1

## 2020-01-10 NOTE — BH Assessment (Signed)
Behavioral Health Note:  Per Nanine Means DNP, patient is recommended for overnight observation for safety and stability while she is being medically cleared and a [facility] provider will re-assess her in the morning.

## 2020-01-10 NOTE — ED Notes (Signed)
ED Provider at bedside. 
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
## 2020-01-10 NOTE — ED Triage Notes (Signed)
Patient c/o "something in my ear and crawling in my body" x1 week. Hx anxiety. Grandmother at bedside.

## 2020-01-10 NOTE — ED Notes (Signed)
Date and time results received: 01/10/20 6:51 PM  (use smartphrase ".now" to insert current time)  Test: Potassium Critical Value: 2.7  Name of Provider Notified: Steinl  Orders Received? Or Actions Taken?: Awaiting further orders

## 2020-01-10 NOTE — ED Provider Notes (Signed)
[facility] Note   CSN: [id number] Arrival date & time: 01/10/20  1634     History Chief Complaint  Patient presents with  . Hallucinations    Natasha Pope is a 24 y.o. female.  Patient with hx anxiety, c/o severe anxiety, and also feeling that live things, some shaped like bugs, strings, fuzz, globs of fat, and hair and moving about and coming out all over body, including from ears, chest/breasts, vaginal and anus. Symptoms acute onset in past few weeks, episodic, recurrent, persistent.  Hx similar symptoms in past. Patient has some of pieces of hair, and other inanimate objects in plastics bags, that she indicates are coming out of her body. Pt limited historian, psych illness- level 5 caveat. Denies severe depression or thoughts of self harm, no SI. Hx etoh abuse, denies recent.   The history is provided by the patient and a relative. The history is limited by the condition of the patient.       Past Medical History:  Diagnosis Date  . Anxiety   . Asthma   . Depression   . Pyelonephritis   . Sepsis [facility])     Patient Active Problem List   Diagnosis Date Noted  . Cocaine use disorder, moderate, in early remission (HCC) 08/13/2019  . 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/2019  . 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 Alcohol withdrawal (HCC) 01/10/2019  . Alcohol use disorder, severe, dependence (HCC) 12/27/2018  . Recurrent major depressive disorder (HCC) 12/27/2018  . Seizure (HCC) 12/26/2018  . Alcohol use with alcohol-induced mood disorder (HCC)   . Substance induced mood disorder (HCC) 12/09/2018  . Macrocytic anemia 05/19/2018  . Thrombocytopenia (HCC) 05/19/2018  . ASCUS of cervix with negative high risk HPV 03/07/2018  . Tobacco abuse 01/31/2018  . Asthma 01/31/2018  . GAD (generalized anxiety disorder) 01/31/2018    Past Surgical History:  Procedure Laterality  Date  . dental procedure       OB History   No obstetric history on file.     Family History  Problem Relation Age of Onset  . Depression Mother   . Miscarriages / [place] Mother   . Hypertension Father   . Alcohol abuse Father   . Learning disabilities Sister   . Hyperlipidemia Maternal Grandmother   . Alcohol abuse Maternal Grandfather   . Alcohol abuse Paternal Grandmother   . Hypertension Paternal Grandfather     Social History   Tobacco Use  . Smoking status: Former Smoker    Packs/day: 0.50    Types: Cigarettes    Quit date: 05/10/2018    Years since quitting: 1.6  . Smokeless tobacco: Never Used  Vaping Use  . Vaping Use: Never used  Substance Use Topics  . Alcohol use: Yes    Comment: 2-3 days/week  . Drug use: No    Home Medications Prior to Admission medications   Medication Sig Start Date End Date Taking? Authorizing Provider  buPROPion (WELLBUTRIN XL) 150 MG 24 hr tablet Take 1 tablet (150 mg total) by mouth daily. 12/14/19   Orland Mustard, MD  DULoxetine (CYMBALTA) 60 MG capsule Take 1 capsule (60 mg total) by mouth daily. 01/21/19   Malvin Johns, MD  fluticasone (FLONASE) 50 MCG/ACT nasal spray Place 2 sprays into both nostrils daily. 07/30/19   Orland Mustard, MD  hydrOXYzine (ATARAX/VISTARIL) 25 MG tablet Take 1 tablet (25 mg total) by mouth 3 (three) times daily. 12/14/19  Orland Mustard, MD  ibuprofen (ADVIL) 200 MG tablet Take 1-2 tablets (200-400 mg total) by mouth 3 (three) times daily as needed for headache or moderate pain. 01/16/19   Briant Cedar, MD  naltrexone (DEPADE) 50 MG tablet Take 1 tablet (50 mg total) by mouth daily. 12/14/19   Orland Mustard, MD  norethindrone-ethinyl estradiol (LOESTRIN FE) 1-20 MG-MCG tablet Take 1 tablet by mouth daily. 10/28/19   Orland Mustard, MD  topiramate (TOPAMAX) 50 MG tablet Take 1 tablet (50 mg total) by mouth daily. 12/14/19   Orland Mustard, MD  traZODone (DESYREL) 100 MG tablet Take 2 tablets (200  mg total) by mouth at bedtime. 08/13/19   Pucilowski, Roosvelt Maser, MD    Allergies    Patient has no known allergies.  Review of Systems   Review of Systems  Constitutional: Negative for fever.  HENT: Negative for sore throat.   Eyes: Negative for redness.  Respiratory: Negative for shortness of breath.   Cardiovascular: Negative for chest pain.  Gastrointestinal: Negative for abdominal pain.  Genitourinary: Negative for flank pain.  Musculoskeletal: Negative for neck pain.  Skin: Negative for rash.  Neurological: Negative for headaches.  Hematological: Does not bruise/bleed easily.  Psychiatric/Behavioral: The patient is nervous/anxious.     Physical Exam Updated Vital Signs BP (!) 132/91 (BP Location: Right Arm)   Pulse 100   Temp 98.9 F (37.2 C) (Oral)   Resp 20   LMP 12/25/2019 (Approximate)   SpO2 100%   Physical Exam Vitals and nursing note reviewed.  Constitutional:      Appearance: Normal appearance. She is well-developed.  HENT:     Head: Atraumatic.     Right Ear: Tympanic membrane normal.     Left Ear: Tympanic membrane normal.     Nose: Nose normal.     Mouth/Throat:     Mouth: Mucous membranes are moist.  Eyes:     General: No scleral icterus.    Conjunctiva/sclera: Conjunctivae normal.     Pupils: Pupils are equal, round, and reactive to light.  Neck:     Trachea: No tracheal deviation.  Cardiovascular:     Rate and Rhythm: Normal rate and regular rhythm.     Pulses: Normal pulses.     Heart sounds: Normal heart sounds. No murmur heard.  No friction rub. No gallop.   Pulmonary:     Effort: Pulmonary effort is normal. No respiratory distress.     Breath sounds: Normal breath sounds.  Abdominal:     General: Bowel sounds are normal. There is no distension.     Palpations: Abdomen is soft.     Tenderness: There is no abdominal tenderness.  Genitourinary:    Comments: No cva tenderness.  Musculoskeletal:        General: No swelling.      Cervical back: Normal range of motion and neck supple. No rigidity. No muscular tenderness.  Skin:    General: Skin is warm and dry.     Findings: No rash.  Neurological:     Mental Status: She is alert.     Comments: Alert, speech normal. Steady gait.   Psychiatric:     Comments: Pt very anxious. Delusional.      ED Results / Procedures / Treatments   Labs (all labs ordered are listed, but only abnormal results are displayed) Results for orders placed or performed during the hospital encounter of 01/10/20  Comprehensive metabolic panel  Result Value Ref Range   Sodium 142 135 -  145 mmol/L   Potassium 2.7 (LL) 3.5 - 5.1 mmol/L   Chloride 104 98 - 111 mmol/L   CO2 23 22 - 32 mmol/L   Glucose, Bld 73 70 - 99 mg/dL   BUN 7 6 - 20 mg/dL   Creatinine, Ser 0.62 0.44 - 1.00 mg/dL   Calcium 9.1 8.9 - 10.3 mg/dL   Total Protein 7.2 6.5 - 8.1 g/dL   Albumin 4.3 3.5 - 5.0 g/dL   AST 32 15 - 41 U/L   ALT 19 0 - 44 U/L   Alkaline Phosphatase 58 38 - 126 U/L   Total Bilirubin 1.1 0.3 - 1.2 mg/dL   GFR calc non Af Amer >60 >60 mL/min   GFR calc Af Amer >60 >60 mL/min   Anion gap 15 5 - 15  Ethanol  Result Value Ref Range   Alcohol, Ethyl (B) <10 <10 mg/dL  CBC  Result Value Ref Range   WBC 9.5 4.0 - 10.5 K/uL   RBC 3.64 (L) 3.87 - 5.11 MIL/uL   Hemoglobin 12.6 12.0 - 15.0 g/dL   HCT 37.6 36 - 46 %   MCV 103.3 (H) 80.0 - 100.0 fL   MCH 34.6 (H) 26.0 - 34.0 pg   MCHC 33.5 30.0 - 36.0 g/dL   RDW 13.0 11.5 - 15.5 %   Platelets 247 150 - 400 K/uL   nRBC 0.0 0.0 - 0.2 %  Magnesium  Result Value Ref Range   Magnesium 2.0 1.7 - 2.4 mg/dL  I-Stat beta hCG blood, ED  Result Value Ref Range   I-stat hCG, quantitative <5.0 <5 mIU/mL   Comment 3           DG Neck Soft Tissue  Result Date: 12/27/2019 CLINICAL DATA:  Sensation of throat swelling, globus sensation EXAM: NECK SOFT TISSUES - 1+ VIEW COMPARISON:  None. FINDINGS: Nonspecific thickening of the aryepiglottic folds with  shouldering of the upper airway on frontal radiograph. The airway appears otherwise patent on lateral few. No retropharyngeal soft tissue thickening or gas. The epiglottis remains sharp and well-defined. No radiopaque foreign body. Slight reversal the normal cervical lordosis may be positional. IMPRESSION: Nonspecific thickening of the aryepiglottic folds with shouldering of the upper airway on frontal radiograph. Nonspecific but possibly reactive. Electronically Signed   By: Lovena Le M.D.   On: 12/27/2019 03:01    EKG None  Radiology No results found.  Procedures Procedures (including critical care time)  Medications Ordered in ED Medications - No data to display  ED Course  I have reviewed the triage vital signs and the nursing notes.  Pertinent labs & imaging results that were available during my care of the patient were reviewed by me and considered in my medical decision making (see chart for details).    MDM Rules/Calculators/A&P                          Labs sent.   [place] team consulted.   Reviewed nursing notes and prior charts for additional history.   Labs reviewed/interpreted by me - k low. kcl po. Mg added to labs. Mg normal.   Ativan po.  Home meds ordered.   Recheck pt, calmer, less anxious. Await [facility] eval.  The patient has been placed in psychiatric observation due to the need to provide a safe environment for the patient while obtaining psychiatric consultation and evaluation, as well as ongoing medical and medication management to treat the patient's condition.  The patient has not been placed under full IVC at this time.  Disposition per [facility] team.     Final Clinical Impression(s) / ED Diagnoses Final diagnoses:  None    Rx / DC Orders ED Discharge Orders    None       Cathren Laine, MD 01/10/20 2156

## 2020-01-11 ENCOUNTER — Other Ambulatory Visit: Payer: Self-pay

## 2020-01-11 ENCOUNTER — Encounter (HOSPITAL_COMMUNITY): Payer: Self-pay

## 2020-01-11 ENCOUNTER — Ambulatory Visit (INDEPENDENT_AMBULATORY_CARE_PROVIDER_SITE_OTHER)
Admission: EM | Admit: 2020-01-11 | Discharge: 2020-01-12 | Disposition: A | Discharge status: Home / Self Care | Payer: 59 | Source: Home / Self Care | Attending: Psychiatry | Admitting: Psychiatry

## 2020-01-11 ENCOUNTER — Encounter (HOSPITAL_COMMUNITY): Payer: Self-pay | Admitting: Registered Nurse

## 2020-01-11 DIAGNOSIS — F1020 Alcohol dependence, uncomplicated: Secondary | ICD-10-CM | POA: Diagnosis not present

## 2020-01-11 DIAGNOSIS — F12959 Cannabis use, unspecified with psychotic disorder, unspecified: Secondary | ICD-10-CM | POA: Diagnosis present

## 2020-01-11 DIAGNOSIS — F1220 Cannabis dependence, uncomplicated: Secondary | ICD-10-CM

## 2020-01-11 LAB — BASIC METABOLIC PANEL
Anion gap: 16 — ABNORMAL HIGH (ref 5–15)
BUN: 6 mg/dL (ref 6–20)
CO2: 20 mmol/L — ABNORMAL LOW (ref 22–32)
Calcium: 8.6 mg/dL — ABNORMAL LOW (ref 8.9–10.3)
Chloride: 105 mmol/L (ref 98–111)
Creatinine, Ser: 0.61 mg/dL (ref 0.44–1.00)
GFR calc Af Amer: >60 mL/min (ref >60)
GFR calc non Af Amer: >60 mL/min (ref >60)
Glucose, Bld: 61 mg/dL — ABNORMAL LOW (ref 70–99)
Potassium: 3.1 mmol/L — ABNORMAL LOW (ref 3.5–5.1)
Sodium: 141 mmol/L (ref 135–145)

## 2020-01-11 LAB — PREGNANCY, URINE: Preg Test, Ur: NEGATIVE

## 2020-01-11 LAB — SARS CORONAVIRUS 2 BY RT PCR (HOSPITAL ORDER, PERFORMED IN ~~LOC~~ HOSPITAL LAB): SARS Coronavirus 2: NEGATIVE

## 2020-01-11 MED ORDER — ONDANSETRON 4 MG PO TBDP
4.0000 mg | ORAL_TABLET | Freq: Once | ORAL | Status: AC
  Administered 2020-01-11: 4 mg via ORAL
  Filled 2020-01-11: qty 1

## 2020-01-11 NOTE — ED Notes (Signed)
Sitter at bedside with patient. Patient in no acute distress, no needs identified. Will continue to monitor.

## 2020-01-11 NOTE — ED Notes (Addendum)
Pts grandmother would like an update on deposition when possible.  [phone]

## 2020-01-11 NOTE — ED Notes (Signed)
Patient sitting up in bed eating breakfast tray. No acute needs identified.

## 2020-01-11 NOTE — Consult Note (Signed)
  Natasha Pope, 24 y.o., female patient seen face to face by this provider, TTS, consulted with Dr. Lucianne Muss; and chart reviewed on 01/11/20.  On evaluation Natasha Pope reports she came to the hospital because "couple weeks ago I pulled a tick out of my ear and at the site where the tick was pulled off I saw something that was long and black and moving; and pulled it out.  I was also having the same thing coming out of my vagina and rectum.  I found a black area on the floor by my desk that looked kinda like a spider that I tried to clean up."   Patient states that she has no support other than her grandmother who has come from [name] and is currently staying in her motel room where she lives.  Patient states she has outpatient psychiatric services.  Gave permission to speak to her grandmother Natasha Pope) at [phone]  Restart home medications  Disposition:  Transfer to [facility] for continuous assessment

## 2020-01-11 NOTE — ED Notes (Signed)
Pt and her belongings escorted to [employer].

## 2020-01-11 NOTE — ED Triage Notes (Signed)
Patient c/o 'there was a worm in my ear and I smashed it and now my ear is decaying. There was stuff draining out of my ear and forehead." "I'm not delusional, my ear is killing me, I have sharp pains in my upper back. It all started two months ago with a big glob in my shoulder"  Pt calm & compliant, states she was owner of a beaded dragon and thinks that may be the cause so her grandmother got rid of it.  Pt states she hasn't slept in 5 days d/t 'a lot of stress... terrible anxiety.'

## 2020-01-12 MED ORDER — THIAMINE HCL 100 MG PO TABS: 100.0000 mg | ORAL_TABLET | Freq: Every day | ORAL | Status: DC

## 2020-01-12 MED ORDER — TOPIRAMATE 25 MG PO TABS: 50.0000 mg | ORAL_TABLET | Freq: Every day | ORAL | Status: DC

## 2020-01-12 MED ORDER — ALUM & MAG HYDROXIDE-SIMETH 200-200-20 MG/5ML PO SUSP: 30.0000 mL | ORAL | Status: DC | PRN

## 2020-01-12 MED ORDER — LOPERAMIDE HCL 2 MG PO CAPS: 2.0000 mg | ORAL_CAPSULE | ORAL | Status: DC | PRN

## 2020-01-12 MED ORDER — ADULT MULTIVITAMIN W/MINERALS CH
1.0000 | ORAL_TABLET | Freq: Every day | ORAL | Status: DC
  Administered 2020-01-12: 1 via ORAL
  Filled 2020-01-12: qty 1

## 2020-01-12 MED ORDER — ONDANSETRON 4 MG PO TBDP: 4.0000 mg | ORAL_TABLET | Freq: Four times a day (QID) | ORAL | Status: DC | PRN

## 2020-01-12 MED ORDER — TRAZODONE HCL 100 MG PO TABS
200.0000 mg | ORAL_TABLET | Freq: Every evening | ORAL | Status: DC | PRN
  Administered 2020-01-12: 200 mg via ORAL
  Filled 2020-01-12: qty 2

## 2020-01-12 MED ORDER — NALTREXONE HCL 50 MG PO TABS
50.0000 mg | ORAL_TABLET | Freq: Every day | ORAL | Status: DC
  Administered 2020-01-12: 50 mg via ORAL
  Filled 2020-01-12: qty 1

## 2020-01-12 MED ORDER — LORAZEPAM 1 MG PO TABS: 1.0000 mg | ORAL_TABLET | Freq: Four times a day (QID) | ORAL | Status: DC | PRN

## 2020-01-12 MED ORDER — HYDROXYZINE HCL 25 MG PO TABS
25.0000 mg | ORAL_TABLET | Freq: Three times a day (TID) | ORAL | Status: DC
  Administered 2020-01-12: 25 mg via ORAL
  Filled 2020-01-12: qty 1

## 2020-01-12 MED ORDER — ACETAMINOPHEN 325 MG PO TABS
650.0000 mg | ORAL_TABLET | Freq: Four times a day (QID) | ORAL | Status: DC | PRN
  Administered 2020-01-12: 650 mg via ORAL
  Filled 2020-01-12: qty 2

## 2020-01-12 MED ORDER — TRIAMCINOLONE ACETONIDE 0.1 % EX CREA
1.0000 "application " | TOPICAL_CREAM | Freq: Two times a day (BID) | CUTANEOUS | Status: DC
  Administered 2020-01-12: 1 via TOPICAL
  Filled 2020-01-12: qty 15

## 2020-01-12 MED ORDER — MAGNESIUM HYDROXIDE 400 MG/5ML PO SUSP: 30.0000 mL | Freq: Every day | ORAL | Status: DC | PRN

## 2020-01-12 MED ORDER — GABAPENTIN 100 MG PO CAPS
100.0000 mg | ORAL_CAPSULE | Freq: Two times a day (BID) | ORAL | Status: DC
  Administered 2020-01-12 (×2): 100 mg via ORAL
  Filled 2020-01-12 (×2): qty 1

## 2020-01-12 NOTE — ED Notes (Signed)
Nurse Discharge Note:  D:Patient denies SI/HI/AVH at this time. Pt appears calm and cooperative, and no distress noted.  A: All Personal items in locker returned to pt. Pt given AVS/Follow-Up appointments. AVS reviewed with patient and she verbalized understanding.   R:  Pt States she will comply with outpatient services, and take medications  as prescribed.

## 2020-01-12 NOTE — ED Notes (Signed)
Patient is alert and oriented to person/place/time and situation. Denies having any thoughts of SI/HI/AVH and pain at this time. Patient asked for a breakfast bar this morning, breakfast bar given and eaten by the patient. Milieu remains safe and therapeutic at this time. Will continue to monitor.
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
## 2020-01-12 NOTE — ED Provider Notes (Signed)
FBC/OBS ASAP Discharge Summary  Date and Time: 01/12/2020 8:56 AM  Name: Natasha Pope  MRN:  [id number]   Discharge Diagnoses:  Final diagnoses:  Cannabis use disorder, severe, dependence ([place])  Alcohol use disorder, severe, dependence ([place])    Subjective: Patient is a 24 year old female who presented last night due to reports of tactile hallucinations.  She had reported no suicidal or homicidal ideations and denies any auditory or visual hallucinations.  Patient reported she felt like she was having things, black dots, that recommend out of her vagina as well as her anus.  Patient reports that she had a tick in her ear that she dug out yesterday and she kept feeling like things were crawling all over her.  She does admit to having a history of alcohol use as well as, and marijuana use.  Stay Summary: Patient presents this morning calm, cooperative, and pleasant.  Patient was able to sleep last night and reports that yes she did have feelings of things crawling on her but she does not feel it is a hallucination.  Patient denies any suicidal or homicidal ideations and denies any hallucinations today.  She states that she does want to follow-up with her PCP to make sure there is nothing else in her ear.  Patient does not have a psychiatrist or therapist at the moment and a appointment was established for the patient.  Patient was discharged with no medications as patient continued to deny any suicidal or homicidal ideations and denies any hallucinations. Patient does appear to be slightly paranoid about finding a tick on her, but has a logical conversation and plan for follow up.  Total Time spent with patient: 30 minutes  Past Psychiatric History: Alcohol use dependence, marijuana use dependence, previous hospitalizations with last being on 01/10/2019.  Patient's potassium came back up to 3.1 on her last lab Past Medical History:  Past Medical History:  Diagnosis Date  . Anxiety   .  Asthma   . Depression   . Pyelonephritis   . Sepsis [facility])     Past Surgical History:  Procedure Laterality Date  . dental procedure     Family History:  Family History  Problem Relation Age of Onset  . Depression Mother   . Miscarriages / [place] Mother   . Hypertension Father   . Alcohol abuse Father   . Learning disabilities Sister   . Hyperlipidemia Maternal Grandmother   . Alcohol abuse Maternal Grandfather   . Alcohol abuse Paternal Grandmother   . Hypertension Paternal Grandfather    Family Psychiatric History: Mother depression, father alcohol abuse, maternal grandfather and paternal grandmother alcohol abuse Social History:  Social History   Substance and Sexual Activity  Alcohol Use Yes   Comment: 2-3 days/week     Social History   Substance and Sexual Activity  Drug Use Yes  . Frequency: 21.0 times per week  . Types: Marijuana   Comment: 3-4 bowls/day but not every day    Social History   Socioeconomic History  . Marital status: Single    Spouse name: Not on file  . Number of children: Not on file  . Years of education: Not on file  . Highest education level: Not on file  Occupational History  . Not on file  Tobacco Use  . Smoking status: Light Tobacco Smoker    Packs/day: 0.25    Types: Cigarettes    Last attempt to quit: 05/10/2018    Years since quitting: 1.6  .  Smokeless tobacco: Never Used  . Tobacco comment: mixes with marijuana  Vaping Use  . Vaping Use: Never used  Substance and Sexual Activity  . Alcohol use: Yes    Comment: 2-3 days/week  . Drug use: Yes    Frequency: 21.0 times per week    Types: Marijuana    Comment: 3-4 bowls/day but not every day  . Sexual activity: Not on file  Other Topics Concern  . Not on file  Social History Narrative  . Not on file   Social Determinants of Health   Financial Resource Strain:   . Difficulty of Paying Living Expenses:   Food Insecurity:   . Worried About Programme researcher, broadcasting/film/video in  the Last Year:   . Barista in the Last Year:   Transportation Needs:   . Freight forwarder (Medical):   Marland Kitchen Lack of Transportation (Non-Medical):   Physical Activity:   . Days of Exercise per Week:   . Minutes of Exercise per Session:   Stress:   . Feeling of Stress :   Social Connections:   . Frequency of Communication with Friends and Family:   . Frequency of Social Gatherings with Friends and Family:   . Attends Religious Services:   . Active Member of Clubs or Organizations:   . Attends Banker Meetings:   Marland Kitchen Marital Status:    SDOH:  SDOH Screenings   Alcohol Screen: Medium Risk  . Last Alcohol Screening Score (AUDIT): 18  Depression (PHQ2-9): Medium Risk  . PHQ-2 Score: 9  Financial Resource Strain:   . Difficulty of Paying Living Expenses:   Food Insecurity:   . Worried About Programme researcher, broadcasting/film/video in the Last Year:   . [employer] of Food in the Last Year:   Housing:   . Last Housing Risk Score:   Physical Activity:   . Days of Exercise per Week:   . Minutes of Exercise per Session:   Social Connections:   . Frequency of Communication with Friends and Family:   . Frequency of Social Gatherings with Friends and Family:   . Attends Religious Services:   . Active Member of Clubs or Organizations:   . Attends Banker Meetings:   Marland Kitchen Marital Status:   Stress:   . Feeling of Stress :   Tobacco Use: High Risk  . Smoking Tobacco Use: Light Tobacco Smoker  . Smokeless Tobacco Use: Never Used  Transportation Needs:   . Freight forwarder (Medical):   Marland Kitchen Lack of Transportation (Non-Medical):     Has this patient used any form of tobacco in the last 30 days? (Cigarettes, Smokeless Tobacco, Cigars, and/or Pipes) Prescription not provided because: Refused  Current Medications:  Current Facility-Administered Medications  Medication Dose Route Frequency Provider Last Rate Last Admin  . acetaminophen (TYLENOL) tablet 650 mg  650 mg Oral  Q6H PRN Nira Conn A, NP      . alum & mag hydroxide-simeth (MAALOX/MYLANTA) 200-200-20 MG/5ML suspension 30 mL  30 mL Oral Q4H PRN Nira Conn A, NP      . gabapentin (NEURONTIN) capsule 100 mg  100 mg Oral BID Nira Conn A, NP   100 mg at 01/12/20 0207  . hydrOXYzine (ATARAX/VISTARIL) tablet 25 mg  25 mg Oral TID Nira Conn A, NP      . loperamide (IMODIUM) capsule 2-4 mg  2-4 mg Oral PRN Jackelyn Poling, NP      . LORazepam (ATIVAN)  tablet 1 mg  1 mg Oral Q6H PRN Nira Conn A, NP      . magnesium hydroxide (MILK OF MAGNESIA) suspension 30 mL  30 mL Oral Daily PRN Nira Conn A, NP      . multivitamin with minerals tablet 1 tablet  1 tablet Oral Daily Nira Conn A, NP      . naltrexone (DEPADE) tablet 50 mg  50 mg Oral Daily Nira Conn A, NP      . ondansetron (ZOFRAN-ODT) disintegrating tablet 4 mg  4 mg Oral Q6H PRN Jackelyn Poling, NP      . Melene Muller ON 01/13/2020] thiamine tablet 100 mg  100 mg Oral Daily Nira Conn A, NP      . traZODone (DESYREL) tablet 200 mg  200 mg Oral QHS PRN Nira Conn A, NP   200 mg at 01/12/20 0207  . triamcinolone cream (KENALOG) 0.1 % 1 application  1 application Topical BID Jackelyn Poling, NP       Current Outpatient Medications  Medication Sig Dispense Refill  . buPROPion (WELLBUTRIN XL) 150 MG 24 hr tablet Take 1 tablet (150 mg total) by mouth daily. 90 tablet 0  . DULoxetine (CYMBALTA) 60 MG capsule Take 1 capsule (60 mg total) by mouth daily. 90 capsule 2  . fluticasone (FLONASE) 50 MCG/ACT nasal spray Place 2 sprays into both nostrils daily. (Patient taking differently: Place 2 sprays into both nostrils daily as needed for allergies. ) 16 g 6  . gabapentin (NEURONTIN) 100 MG capsule Take 100 mg by mouth 2 (two) times daily.    . hydrocortisone 2.5 % cream Apply 1 application topically 2 (two) times daily.    . hydrOXYzine (ATARAX/VISTARIL) 25 MG tablet Take 1 tablet (25 mg total) by mouth 3 (three) times daily. 90 tablet 1  . ibuprofen  (ADVIL) 200 MG tablet Take 1-2 tablets (200-400 mg total) by mouth 3 (three) times daily as needed for headache or moderate pain. 30 tablet 0  . methocarbamol (ROBAXIN) 750 MG tablet Take 750 mg by mouth 4 (four) times daily as needed for muscle spasms.    . naltrexone (DEPADE) 50 MG tablet Take 1 tablet (50 mg total) by mouth daily. 30 tablet 0  . norethindrone-ethinyl estradiol (LOESTRIN FE) 1-20 MG-MCG tablet Take 1 tablet by mouth daily. 3 Package 2  . topiramate (TOPAMAX) 50 MG tablet Take 1 tablet (50 mg total) by mouth daily. 90 tablet 0  . traZODone (DESYREL) 100 MG tablet Take 2 tablets (200 mg total) by mouth at bedtime. (Patient taking differently: Take 200 mg by mouth at bedtime as needed for sleep. ) 60 tablet 2  . triamcinolone cream (KENALOG) 0.1 % Apply 1 application topically 2 (two) times daily.      PTA Medications: (Not in a hospital admission)   Musculoskeletal  Strength & Muscle Tone: within normal limits Gait & Station: normal Patient leans: N/A  Psychiatric Specialty Exam  Presentation  General Appearance: Disheveled  Eye Contact:Good  Speech:Clear and Coherent;Normal Rate  Speech Volume:Normal  Handedness:Right   Mood and Affect  Mood:Euthymic  Affect:Congruent   Thought Process  Thought Processes:Coherent  Descriptions of Associations:Intact  Orientation:Full (Time, Place and Person)  Thought Content:WDL  Hallucinations:Hallucinations: None  Ideas of Reference:None  Suicidal Thoughts:Suicidal Thoughts: No  Homicidal Thoughts:Homicidal Thoughts: No   Sensorium  Memory:Immediate Good;Recent Good;Remote Good  Judgment:Fair  Insight:Fair   Executive Functions  Concentration:Fair  Attention Span:Fair  Recall:Fair  Fund of Knowledge:Fair  Language:Fair  Psychomotor Activity  Psychomotor Activity:Psychomotor Activity: Normal   Assets  Assets:Communication Skills;Desire for Improvement;Housing;Social  Support;Transportation   Sleep  Sleep:Sleep: Fair   Physical Exam  Physical Exam Vitals and nursing note reviewed.  Constitutional:      Appearance: She is well-developed.  Cardiovascular:     Rate and Rhythm: Normal rate.  Pulmonary:     Effort: Pulmonary effort is normal.  Musculoskeletal:        General: Normal range of motion.  Skin:    General: Skin is warm.  Neurological:     Mental Status: She is alert and oriented to person, place, and time.    Review of Systems  Constitutional: Negative.   HENT: Negative.   Eyes: Negative.   Respiratory: Negative.   Cardiovascular: Negative.   Gastrointestinal: Negative.   Genitourinary: Negative.   Musculoskeletal: Negative.   Skin: Negative.   Neurological: Negative.   Endo/Heme/Allergies: Negative.   Psychiatric/Behavioral: Negative.    Blood pressure (!) 137/109, pulse (!) 118, temperature 98.6 F (37 C), temperature source Oral, resp. rate 20, height 5\' 6"  (1.676 m), weight 58.1 kg, last menstrual period 12/25/2019, SpO2 100 %. Body mass index is 20.66 kg/m.  Demographic Factors:  Adolescent or young adult and Low socioeconomic status  Loss Factors: NA  Historical Factors: Family history of mental illness or substance abuse  Risk Reduction Factors:   Living with another person, especially a relative, Positive social support and Positive therapeutic relationship  Continued Clinical Symptoms:  Alcohol/Substance Abuse/Dependencies  Cognitive Features That Contribute To Risk:  None    Suicide Risk:  Minimal: No identifiable suicidal ideation.  Patients presenting with no risk factors but with morbid ruminations; may be classified as minimal risk based on the severity of the depressive symptoms  Plan Of Care/Follow-up recommendations:  Continue activity as tolerated. Continue diet as recommended by your PCP. Ensure to keep all appointments with outpatient providers.  Disposition: Discharge home, follow up  appointment scheduled  02/24/2020, FNP 01/12/2020, 8:56 AM

## 2020-01-12 NOTE — ED Notes (Signed)
D: Pt denies SI/HI/AVH. Pt is pleasant and cooperative. Pt goal for today is to go home. A: Pt was offered support and encouragement. Monitoring continues for safety. R:Patient interacts well with peers and staff.  Pt has no complaints.Pt receptive to treatment and safety maintained on unit.

## 2020-01-12 NOTE — Discharge Instructions (Signed)
Contact PCP for sooner appointment Continue current home medications

## 2020-01-15 ENCOUNTER — Ambulatory Visit: Payer: 59 | Admitting: Family Medicine

## 2020-01-15 ENCOUNTER — Encounter: Payer: Self-pay | Admitting: Family Medicine

## 2020-01-15 ENCOUNTER — Other Ambulatory Visit: Payer: Self-pay

## 2020-01-15 VITALS — BP 120/70 | HR 79 | Temp 98.1°F | Ht 66.0 in | Wt 129.4 lb

## 2020-01-15 DIAGNOSIS — M940 Chondrocostal junction syndrome [Tietze]: Secondary | ICD-10-CM | POA: Diagnosis not present

## 2020-01-15 DIAGNOSIS — L989 Disorder of the skin and subcutaneous tissue, unspecified: Secondary | ICD-10-CM | POA: Diagnosis not present

## 2020-01-15 DIAGNOSIS — E876 Hypokalemia: Secondary | ICD-10-CM

## 2020-01-15 MED ORDER — ACYCLOVIR 400 MG PO TABS
400.0000 mg | ORAL_TABLET | Freq: Three times a day (TID) | ORAL | 0 refills | Status: DC
Start: 2020-01-15 — End: 2020-05-04

## 2020-01-15 MED ORDER — POTASSIUM CHLORIDE ER 10 MEQ PO TBCR: 10.0000 meq | EXTENDED_RELEASE_TABLET | Freq: Every day | ORAL | 0 refills | Status: DC

## 2020-01-15 MED ORDER — MUPIROCIN 2 % EX OINT: 1.0000 "application " | TOPICAL_OINTMENT | Freq: Three times a day (TID) | CUTANEOUS | 0 refills | Status: DC

## 2020-01-15 NOTE — Progress Notes (Signed)
Patient: Natasha Pope MRN: [id number] DOB: 1996-02-06 PCP: Orland Mustard, MD     Subjective:  Chief Complaint  Patient presents with  . Rash    Starting 2 weeks ago, she has used hydrocortisone, and now neosporin   . Blister    HPI: The patient is a 24 y.o. female who presents today for rash and blisters. Also has other complaints and seen in ER for MJ and hallucinations recently. Her grandmother is with her.   She found a tick in her right ear about 4-5 days ago and found a growth in her room on her floor. She cleaned the area and then broke out in rash on her face. She has lesions on her face, ear and lips that were oozing. They are starting to heal. She states the lesions made her feel scared. She also had swelling in her legs. She denies any previous URI, fever, illness.   Pain in her chest -started a few days ago. Hurts on her sternum and her back at times. Painful to touch and with deep breath. No recent coughing, fever/chills. She states it is not as bad as it was. She has not taken anything over the counter for pain. Never had before. She does not exercise. No pain with walking.   Breast complaint She would like me to look at her breasts. She feels like they are very saggy and this is not normal. She has lost a significant amount of weight and doesn't wear bras a lot at home. Never been pregnant.   Er visit MJ abuse/hallucinations. Evaluated by [facility] and did not admit. Do not see that she was set up with psychiatrist who she needs. Never followed up with her psychiatrist that she has and I do not think her symptoms are managed. Labs reviewed from ER.   Hypokalemia 2.7-->3.1   Review of Systems  Constitutional: Negative for chills, fatigue and fever.  Respiratory: Negative for cough, shortness of breath and wheezing.   Cardiovascular: Positive for chest pain (reproducible ). Negative for palpitations and leg swelling.  Gastrointestinal: Negative for abdominal pain,  diarrhea and vomiting.  Musculoskeletal: Negative for arthralgias.  Skin: Positive for rash and wound.  Psychiatric/Behavioral: Negative for agitation, dysphoric mood and suicidal ideas. The patient is not nervous/anxious.     Allergies Patient has No Known Allergies.  Past Medical History Patient  has a past medical history of Anxiety, Asthma, Depression, Pyelonephritis, and Sepsis (HCC).  Surgical History Patient  has a past surgical history that includes dental procedure.  Family History [name]'s family history includes Alcohol abuse in her father, maternal grandfather, and paternal grandmother; Depression in her mother; Hyperlipidemia in her maternal grandmother; Hypertension in her father and paternal grandfather; Learning disabilities in her sister; Miscarriages / Stillbirths in her mother.  Social History Patient  reports that she has been smoking cigarettes. She has been smoking about 0.25 packs per day. She has never used smokeless tobacco. She reports current alcohol use. She reports current drug use. Frequency: 21.00 times per week. Drug: Marijuana.    Objective: Vitals:   01/15/20 0812  BP: 120/70  Pulse: 79  Temp: 98.1 F (36.7 C)  TempSrc: Temporal  SpO2: 99%  Weight: 129 lb 6.4 oz (58.7 kg)  Height: 5\' 6"  (1.676 m)    Body mass index is 20.89 kg/m.  Physical Exam Vitals reviewed.  Constitutional:      Appearance: Normal appearance. She is normal weight.  HENT:     Head: Normocephalic and atraumatic.  Right Ear: Tympanic membrane, ear canal and external ear normal.     Left Ear: Tympanic membrane, ear canal and external ear normal.  Eyes:     Extraocular Movements: Extraocular movements intact.     Conjunctiva/sclera: Conjunctivae normal.     Pupils: Pupils are equal, round, and reactive to light.  Cardiovascular:     Rate and Rhythm: Normal rate and regular rhythm.     Heart sounds: Normal heart sounds.  Pulmonary:     Effort: Pulmonary effort  is normal.     Breath sounds: Normal breath sounds.  Chest:     Breasts:        Right: Normal.        Left: Normal.  Abdominal:     General: Abdomen is flat. Bowel sounds are normal.     Palpations: Abdomen is soft.  Musculoskeletal:     Comments: Reproducible chest pain over sternum   Skin:    Findings: Lesion (dried up lesion on lip, face, bilateral ears. ) and rash present.  Neurological:     General: No focal deficit present.     Mental Status: She is alert and oriented to person, place, and time.  Psychiatric:        Mood and Affect: Mood normal.        Behavior: Behavior normal.        Assessment/plan: 1. 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 Topical bactroban and course of acyclovir. Let me know if not getting better. Has her annual next week so I can see how she is doing then. Wrote down instructions for medication.   2. Costochondritis Handout given. Ice/NSAIDS.   3. Hypokalemia Replace with 10meQx 7 days. Recheck next week. History of intermittent low potassium.   Reassured her that her breasts are normal.  reviewed ER visit/note.   Sending chart to NP as there is no f/u with psychiatry that I can see that was scheduled. Do not feel like her psychiatric condition is controlled and needs to be followed by psychiatry which I also discussed with patient and her grandmother. Advised they call [facility] as well.    This visit occurred during the SARS-CoV-2 public health emergency.  Safety protocols were in place, including screening questions prior to the visit, additional usage of staff PPE, and extensive cleaning of exam room while observing appropriate contact time as indicated for disinfecting solutions.     Return if symptoms worsen or fail to improve, for has annual next week. Orma Flaming, MD [place]  01/15/2020

## 2020-01-15 NOTE — Patient Instructions (Addendum)
1) STOP neomicin. I sent in prescription ointment called bactroban to put on lesions on face 3x/day. Im also sending in an oral medication called acyclovir that you will take 3x/day for 5 days.   2) breasts look normal.   3) pain in chest is called costochondritis. See below.   4) call behavorial health to make sure you have appointment scheduled.    Costochondritis Costochondritis is swelling and irritation (inflammation) of the tissue (cartilage) that connects your ribs to your breastbone (sternum). This causes pain in the front of your chest. Usually, the pain:  Starts gradually.  Is in more than one rib. This condition usually goes away on its own over time. Follow these instructions at home:  Do not do anything that makes your pain worse.  If directed, put ice on the painful area: ? Put ice in a plastic bag. ? Place a towel between your skin and the bag. ? Leave the ice on for 20 minutes, 2-3 times a day.  If directed, put heat on the affected area as often as told by your doctor. Use the heat source that your doctor tells you to use, such as a moist heat pack or a heating pad. ? Place a towel between your skin and the heat source. ? Leave the heat on for 20-30 minutes. ? Take off the heat if your skin turns bright red. This is very important if you cannot feel pain, heat, or cold. You may have a greater risk of getting burned.  Take over-the-counter and prescription medicines only as told by your doctor.  Return to your normal activities as told by your doctor. Ask your doctor what activities are safe for you.  Keep all follow-up visits as told by your doctor. This is important. Contact a doctor if:  You have chills or a fever.  Your pain does not go away or it gets worse.  You have a cough that does not go away. Get help right away if:  You are short of breath. This information is not intended to replace advice given to you by your health care provider. Make sure  you discuss any questions you have with your health care provider. Document Revised: 07/24/2017 Document Reviewed: 11/02/2015 Elsevier Patient Education  2020 [employer].

## 2020-01-21 ENCOUNTER — Encounter: Payer: 59 | Admitting: Family Medicine

## 2020-01-26 ENCOUNTER — Telehealth: Payer: Self-pay | Admitting: Family Medicine

## 2020-01-26 NOTE — Telephone Encounter (Signed)
Please advise on medication refills.  

## 2020-01-26 NOTE — Telephone Encounter (Signed)
Patient called and stated her medication broke open in her purse and only has 5 left in the bottle of her acyclovir (ZOVIRAX) 400 MG tablet and would like some more sent in to [id number] - [place], [place] - [address] patient also requested a potasium pen to be sent in as well as mupirocin ointment (BACTROBAN) 2 % refilled

## 2020-01-27 ENCOUNTER — Other Ambulatory Visit: Payer: Self-pay | Admitting: Family Medicine

## 2020-01-27 MED ORDER — MUPIROCIN 2 % EX OINT: 1.0000 "application " | TOPICAL_OINTMENT | Freq: Three times a day (TID) | CUTANEOUS | 0 refills | Status: DC

## 2020-01-27 NOTE — Telephone Encounter (Signed)
I spoke to pt to give her message below, she says that she never got treatment because they fell out of her bag. I made pt aware that medicine will not cure her, it is only suppose to help her. No refill was sent.

## 2020-01-27 NOTE — Telephone Encounter (Signed)
Patient calling back about medications. OK to send in refills? Please advise

## 2020-01-27 NOTE — Telephone Encounter (Signed)
The acyclovir was for 5 days only so she should be done with this. Sent in more bactroban cream.  Orland Mustard, MD [facility]

## 2020-02-01 ENCOUNTER — Telehealth: Payer: Self-pay | Admitting: Family Medicine

## 2020-02-01 NOTE — Telephone Encounter (Signed)
A user error has taken place: encounter opened in error, closed for administrative reasons.

## 2020-02-01 NOTE — Telephone Encounter (Signed)
Pt left VM on triage line over the weekend concerning a finger puncture and requesting a tetanus. Pt was given number to wrong clinic that was having virtual Saturday clinic.  LVM for pt that if anything was needed to contact her PCP office or this office if she could not contact them. It appears she already contacted her PCP.

## 2020-02-01 NOTE — Telephone Encounter (Signed)
Nurse Assessment Nurse: Micheline Maze, RN, Clydie Braun Date/Time Lamount Cohen Time): 01/30/2020 11:59:24 AM Confirm and document reason for call. If symptomatic, describe symptoms. ---Caller states that she picked up a cat toy that had pins in it and puncture her - it was bleeding and had some cramping in her hand Has the patient had close contact with a person known or suspected to have the novel coronavirus illness OR traveled / lives in area with major community spread (including international travel) in the last 14 days from the onset of symptoms? * If Asymptomatic, screen for exposure and travel within the last 14 days. ---No Does the patient have any new or worsening symptoms? ---Yes Will a triage be completed? ---Yes Related visit to physician within the last 2 weeks? ---No Does the PT have any chronic conditions? (i.e. diabetes, asthma, this includes High risk factors for pregnancy, etc.) ---No Is the patient pregnant or possibly pregnant? (Ask all females between the ages of 10-55) ---No Is this a behavioral health or substance abuse call? ---No Guidelines Guideline Title Affirmed Question Affirmed Notes Nurse Date/Time (Eastern Time) Puncture Wound Puncture wound from a sharp object that was very dirty Micheline Maze, RN, Clydie Braun 01/30/2020 12:01:53 PM Disp. Time (Eastern Time) Disposition Final UserPLEASE NOTE: All timestamps contained within this report are represented as [place] Standard Time. CONFIDENTIALTY NOTICE: This fax transmission is intended only for the addressee. It contains information that is legally privileged, confidential or otherwise protected from use or disclosure. If you are not the intended recipient, you are strictly prohibited from reviewing, disclosing, copying using or disseminating any of this information or taking any action in reliance on or regarding this information. If you have received this fax in error, please notify us immediately by telephone so that we can arrange for  its return to [place]. Phone: [phone], Toll-Free: [phone], Fax: [phone] Page: 2 of 2 Call Id: [id number] 01/30/2020 12:08:21 PM See HCP within 4 Hours (or PCP triage) Yes Micheline Maze, RN, Pablo Ledger Disagree/Comply Comply Caller Understands Yes PreDisposition Call Doctor Care Advice Given Per Guideline SEE HCP WITHIN 4 HOURS (OR PCP TRIAGE): * IF OFFICE WILL BE CLOSED AND NO PCP (PRIMARY CARE PROVIDER) SECOND-LEVEL TRIAGE: You need to be seen within the next 3 or 4 hours. A nearby Urgent Care Center [facility]) is often a good source of care. Another choice is to go to the ED. Go sooner if you become worse. * Use nurse judgment to select the most appropriate source of care. * Consider both the urgency of the patient's symptoms AND what resources may be needed to evaluate and manage the patient. * OFFICE: If patient sounds stable and not seriously ill, consult PCP (or follow your office policy) to see if patient can be seen NOW in office. WASH THE WOUND: * Wash dirty or clean puncture wounds with warm water and soap for 15 minutes. * For any dirt or debris, scrub the wound surface back and forth with a washcloth to remove it. * If the wound rebleeds a little, that may help remove germs. DRESSING THE WOUND: CALL BACK IF: * You become worse. CARE ADVICE given per Puncture Wound (Adult) guideline. Referrals [facility]

## 2020-02-01 NOTE — Telephone Encounter (Signed)
FYI

## 2020-02-03 ENCOUNTER — Ambulatory Visit: Payer: 59 | Admitting: Family Medicine

## 2020-02-03 DIAGNOSIS — Z0289 Encounter for other administrative examinations: Secondary | ICD-10-CM

## 2020-02-10 ENCOUNTER — Telehealth: Payer: Self-pay

## 2020-02-10 NOTE — Telephone Encounter (Signed)
Please advise 

## 2020-02-10 NOTE — Telephone Encounter (Signed)
Patient states that she has tried multiple times to reach Dr. Josie Saunders with no success. She states she needs all of these medications refilled. She is unaware if Dr. Artis Flock can help or not  Muscle spasm medication Gabapentin Welbutrin Topomax Hydroxozine Cymbalta  Naltrexone

## 2020-02-11 MED ORDER — DULOXETINE HCL 60 MG PO CPEP: 60.0000 mg | ORAL_CAPSULE | Freq: Every day | ORAL | 2 refills | Status: DC

## 2020-02-11 MED ORDER — BUPROPION HCL ER (XL) 150 MG PO TB24: 150.0000 mg | ORAL_TABLET | Freq: Every day | ORAL | 0 refills | Status: DC

## 2020-02-11 MED ORDER — METHOCARBAMOL 750 MG PO TABS: 750.0000 mg | ORAL_TABLET | Freq: Four times a day (QID) | ORAL | 0 refills | Status: DC | PRN

## 2020-02-11 MED ORDER — NALTREXONE HCL 50 MG PO TABS: 50.0000 mg | ORAL_TABLET | Freq: Every day | ORAL | 0 refills | Status: DC

## 2020-02-11 MED ORDER — DULOXETINE HCL 60 MG PO CPEP: 60.0000 mg | ORAL_CAPSULE | Freq: Every day | ORAL | 0 refills | Status: DC

## 2020-02-11 MED ORDER — HYDROXYZINE HCL 25 MG PO TABS: 25.0000 mg | ORAL_TABLET | Freq: Three times a day (TID) | ORAL | 1 refills | Status: DC

## 2020-02-11 MED ORDER — GABAPENTIN 100 MG PO CAPS: 100.0000 mg | ORAL_CAPSULE | Freq: Two times a day (BID) | ORAL | 1 refills | Status: DC

## 2020-02-11 NOTE — Telephone Encounter (Signed)
I have not heard from  this patient since our last appointment on 08/13/19. She missed her follow up on 2/25 and even though I left her a message request to reschedule she did not. She did not leave any requests on our dedicated phone line to have her medications refilled either nor did her pharmacy made such requests. We have tried to get in touch with her (last time on June 22) but nobody would answer and voicemail was full. I could only surmise that she is not motivated to follow up with me.

## 2020-02-11 NOTE — Telephone Encounter (Signed)
Lvm to make pt aware of message below. °

## 2020-02-11 NOTE — Telephone Encounter (Signed)
Pt called checking on refill status. Informed her of the message below and she states it is untrue, that she has been calling Dr. Hinton Dyer every day. Ensured she had correct telephone number and made her aware that Dr. Artis Flock sent in a 30 day supply of meds requested.

## 2020-02-11 NOTE — Telephone Encounter (Signed)
Please let her know I sent in a 30 day supply. I have also attached the NP on this note who was supposed to get her set up in clinic so he can make sure she has follow up. I do not see anywhere in her chart that she called her old psychiatrist-Dr. Hinton Dyer. Will attach him as well as I sent him a note at our last visit and never heard anything.   Thanks,  Dr. Artis Flock

## 2020-02-11 NOTE — Telephone Encounter (Signed)
FYI

## 2020-02-18 ENCOUNTER — Other Ambulatory Visit: Payer: Self-pay

## 2020-02-18 ENCOUNTER — Telehealth (INDEPENDENT_AMBULATORY_CARE_PROVIDER_SITE_OTHER): Payer: 59 | Admitting: Psychiatry

## 2020-02-18 DIAGNOSIS — F411 Generalized anxiety disorder: Secondary | ICD-10-CM | POA: Diagnosis not present

## 2020-02-18 DIAGNOSIS — F330 Major depressive disorder, recurrent, mild: Secondary | ICD-10-CM | POA: Diagnosis not present

## 2020-02-18 DIAGNOSIS — F1021 Alcohol dependence, in remission: Secondary | ICD-10-CM

## 2020-02-18 MED ORDER — METHOCARBAMOL 750 MG PO TABS: 750.0000 mg | ORAL_TABLET | Freq: Four times a day (QID) | ORAL | 2 refills | Status: AC | PRN

## 2020-02-18 MED ORDER — NALTREXONE HCL 50 MG PO TABS: 50.0000 mg | ORAL_TABLET | Freq: Every day | ORAL | 3 refills | Status: DC

## 2020-02-18 MED ORDER — BUPROPION HCL ER (XL) 300 MG PO TB24: 300.0000 mg | ORAL_TABLET | Freq: Every day | ORAL | 2 refills | Status: DC

## 2020-02-18 MED ORDER — TRAZODONE HCL 100 MG PO TABS: 200.0000 mg | ORAL_TABLET | Freq: Every evening | ORAL | 3 refills | Status: DC | PRN

## 2020-02-18 MED ORDER — TOPIRAMATE 50 MG PO TABS: 50.0000 mg | ORAL_TABLET | Freq: Every day | ORAL | 0 refills | Status: DC
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
## 2020-03-24 ENCOUNTER — Telehealth (INDEPENDENT_AMBULATORY_CARE_PROVIDER_SITE_OTHER): Payer: 59 | Admitting: Psychiatry

## 2020-03-24 ENCOUNTER — Other Ambulatory Visit: Payer: Self-pay

## 2020-03-24 DIAGNOSIS — F4001 Agoraphobia with panic disorder: Secondary | ICD-10-CM

## 2020-03-24 DIAGNOSIS — F330 Major depressive disorder, recurrent, mild: Secondary | ICD-10-CM

## 2020-03-24 DIAGNOSIS — F411 Generalized anxiety disorder: Secondary | ICD-10-CM | POA: Diagnosis not present

## 2020-03-24 DIAGNOSIS — F1021 Alcohol dependence, in remission: Secondary | ICD-10-CM | POA: Diagnosis not present

## 2020-03-24 MED ORDER — DULOXETINE HCL 60 MG PO CPEP: 60.0000 mg | ORAL_CAPSULE | Freq: Every day | ORAL | 0 refills | Status: DC

## 2020-03-24 MED ORDER — BUPROPION HCL ER (SR) 200 MG PO TB12: 200.0000 mg | ORAL_TABLET | Freq: Every day | ORAL | 0 refills | Status: DC

## 2020-03-24 MED ORDER — TOPIRAMATE 50 MG PO TABS: 50.0000 mg | ORAL_TABLET | Freq: Every day | ORAL | 0 refills | Status: DC

## 2020-03-24 MED ORDER — HYDROXYZINE HCL 25 MG PO TABS: 25.0000 mg | ORAL_TABLET | Freq: Three times a day (TID) | ORAL | 1 refills | Status: DC

## 2020-03-24 NOTE — Progress Notes (Signed)
BH MD/PA/NP OP Progress Note  03/24/2020 11:45 AM Gaylia Kassel  MRN:  [id number] Interview was conducted by phone and I verified that I was speaking with the correct person using two identifiers. I discussed the limitations of evaluation and management by telemedicine and  the availability of in person appointments. Patient expressed understanding and agreed to proceed. Patient location - home; physician - home office.  Chief Complaint: Fatigue, irritability.  HPI: 24 yo single female with alcohol problem drinking, depression and mixed anxiety.  She now works for [employer] which she finds physically demanding. She had been abusing alcohol since teenage years (aroung 51) and had been drinking daily on average several beers or a bottle of liquor. She has a hx of nausea/vomiting, tremor during withdrawal. She has no hx of withdrawal delirium but does have a hx of withdrawal seizures (on phenytoin since early June 2020 - had a seizure episode at that time). She has remained sober  Since October 2020 but would occasionally smoke marijuana. She reports history of anxiety(excessive worrying)and describes sudden unrelated to any clear triggers with element of agoraphobia (withdrawn, not wanting to come out of her room) which started about a year ago. She has a hx of depression with SI (no attempts). She was on fluoxetine 40 mg with no clear benefit. She was also prescribed benzodiazepines for panic anxiety in the past (diazepam) but also abused them by getting them off the streets to treat anxiety/withdrawal sx. She was started on duloxetine, hydroxyzine, topiramate and trazodone at the [facility] hospital in 2020 (detoxed). She reports feeling emotionally stable on current meds and reports no adverse effects. She only complains of fatigue. We have increased dose of Wellbutrin XL to 300 mg and while it helps with fatigue she noticed it makes her more easily irritable.   Visit Diagnosis:    ICD-10-CM   1. GAD  (generalized anxiety disorder)  F41.1   2. Mild episode of recurrent major depressive disorder (HCC)  F33.0   3. Panic disorder with agoraphobia  F40.01   4. Alcohol use disorder, severe, in early remission, dependence (HCC)  F10.21     Past Psychiatric History: Please see intake H&P.  Past Medical History:  Past Medical History:  Diagnosis Date  . Anxiety   . Asthma   . Depression   . Pyelonephritis   . Sepsis [facility])     Past Surgical History:  Procedure Laterality Date  . dental procedure      Family Psychiatric History: Reviewed.  Family History:  Family History  Problem Relation Age of Onset  . Depression Mother   . Miscarriages / [place] Mother   . Hypertension Father   . Alcohol abuse Father   . Learning disabilities Sister   . Hyperlipidemia Maternal Grandmother   . Alcohol abuse Maternal Grandfather   . Alcohol abuse Paternal Grandmother   . Hypertension Paternal Grandfather     Social History:  Social History   Socioeconomic History  . Marital status: Single    Spouse name: Not on file  . Number of children: Not on file  . Years of education: Not on file  . Highest education level: Not on file  Occupational History  . Not on file  Tobacco Use  . Smoking status: Light Tobacco Smoker    Packs/day: 0.25    Types: Cigarettes    Last attempt to quit: 05/10/2018    Years since quitting: 1.8  . Smokeless tobacco: Never Used  . Tobacco comment: mixes with  marijuana  Vaping Use  . Vaping Use: Never used  Substance and Sexual Activity  . Alcohol use: Yes    Comment: 2-3 days/week  . Drug use: Yes    Frequency: 21.0 times per week    Types: Marijuana    Comment: 3-4 bowls/day but not every day  . Sexual activity: Not on file  Other Topics Concern  . Not on file  Social History Narrative  . Not on file   Social Determinants of Health   Financial Resource Strain:   . Difficulty of Paying Living Expenses: Not on file  Food Insecurity:   .  Worried About Programme researcher, broadcasting/film/video in the Last Year: Not on file  . Ran Out of Food in the Last Year: Not on file  Transportation Needs:   . Lack of Transportation (Medical): Not on file  . Lack of Transportation (Non-Medical): Not on file  Physical Activity:   . Days of Exercise per Week: Not on file  . Minutes of Exercise per Session: Not on file  Stress:   . Feeling of Stress : Not on file  Social Connections:   . Frequency of Communication with Friends and Family: Not on file  . Frequency of Social Gatherings with Friends and Family: Not on file  . Attends Religious Services: Not on file  . Active Member of Clubs or Organizations: Not on file  . Attends Banker Meetings: Not on file  . Marital Status: Not on file    Allergies: No Known Allergies  Metabolic Disorder Labs: No results found for: HGBA1C, MPG No results found for: PROLACTIN Lab Results  Component Value Date   CHOL 164 01/31/2018   TRIG 122.0 01/31/2018   HDL 81.40 01/31/2018   CHOLHDL 2 01/31/2018   VLDL 24.4 01/31/2018   LDLCALC 58 01/31/2018   Lab Results  Component Value Date   TSH 2.60 01/31/2018    Therapeutic Level Labs: No results found for: LITHIUM No results found for: VALPROATE No components found for:  CBMZ  Current Medications: Current Outpatient Medications  Medication Sig Dispense Refill  . acyclovir (ZOVIRAX) 400 MG tablet Take 1 tablet (400 mg total) by mouth 3 (three) times daily. 15 tablet 0  . buPROPion (WELLBUTRIN SR) 200 MG 12 hr tablet Take 1 tablet (200 mg total) by mouth daily. 90 tablet 0  . [START ON 05/11/2020] DULoxetine (CYMBALTA) 60 MG capsule Take 1 capsule (60 mg total) by mouth daily. 90 capsule 0  . fluticasone (FLONASE) 50 MCG/ACT nasal spray Place 2 sprays into both nostrils daily. (Patient taking differently: Place 2 sprays into both nostrils daily as needed for allergies. ) 16 g 6  . gabapentin (NEURONTIN) 100 MG capsule Take 1 capsule (100 mg total)  by mouth 2 (two) times daily. 60 capsule 1  . hydrOXYzine (ATARAX/VISTARIL) 25 MG tablet Take 1 tablet (25 mg total) by mouth 3 (three) times daily. 90 tablet 1  . ibuprofen (ADVIL) 200 MG tablet Take 1-2 tablets (200-400 mg total) by mouth 3 (three) times daily as needed for headache or moderate pain. 30 tablet 0  . methocarbamol (ROBAXIN) 750 MG tablet Take 1 tablet (750 mg total) by mouth 4 (four) times daily as needed for muscle spasms. 60 tablet 2  . mupirocin ointment (BACTROBAN) 2 % Apply 1 application topically 3 (three) times daily. 30 g 0  . naltrexone (DEPADE) 50 MG tablet Take 1 tablet (50 mg total) by mouth daily. 30 tablet 3  .  norethindrone-ethinyl estradiol (LOESTRIN FE) 1-20 MG-MCG tablet Take 1 tablet by mouth daily. 3 Package 2  . potassium chloride (KLOR-CON) 10 MEQ tablet Take 1 tablet (10 mEq total) by mouth daily. 10 tablet 0  . [START ON 05/16/2020] topiramate (TOPAMAX) 50 MG tablet Take 1 tablet (50 mg total) by mouth daily. 90 tablet 0  . traZODone (DESYREL) 100 MG tablet Take 2 tablets (200 mg total) by mouth at bedtime as needed for sleep. 60 tablet 3  . triamcinolone cream (KENALOG) 0.1 % Apply 1 application topically 2 (two) times daily.     No current facility-administered medications for this visit.     Psychiatric Specialty Exam: Review of Systems  Constitutional: Positive for fatigue.  Psychiatric/Behavioral: The patient is nervous/anxious.   All other systems reviewed and are negative.   There were no vitals taken for this visit.There is no height or weight on file to calculate BMI.  General Appearance: NA  Eye Contact:  NA  Speech:  Clear and Coherent and Normal Rate  Volume:  Normal  Mood:  Anxious  Affect:  NA  Thought Process:  Goal Directed  Orientation:  Full (Time, Place, and Person)  Thought Content: Logical   Suicidal Thoughts:  No  Homicidal Thoughts:  No  Memory:  Immediate;   Good Recent;   Good Remote;   Good  Judgement:  Fair   Insight:  Fair  Psychomotor Activity:  NA  Concentration:  Concentration: Good  Recall:  Good  Fund of Knowledge: Good  Language: Good  Akathisia:  Negative  Handed:  Right  AIMS (if indicated): not done  Assets:  Communication Skills Desire for Improvement Financial Resources/Insurance Housing Resilience Social Support Talents/Skills  ADL's:  Intact  Cognition: WNL  Sleep:  Fair   Screenings: AIMS     [facility]  AIMS Total Score 0    AUDIT     [facility]  Alcohol Use Disorder Identification Test Final Score (AUDIT) 18    GAD-7     Office Visit from 12/24/2018 in [facility] from 11/17/2018 in [facility] from 03/03/2018 in [facility] from 01[facility]31/2018 in [facility]  Total GAD-7 Score 14 5 2 9     PHQ2-9     ED from 01/10/2020 in [facility] Office Visit from 12/14/2019 in [facility] 12/16/2019 from 12/24/2018 in [facility] from 01/31/2018 in [facility]  PHQ-2 Total Score 2 2 4 2   PHQ-9 Total Score 9 8 17 7        Assessment and Plan: 24 yo single female with alcohol problem drinking, depression and mixed anxiety.  She now works for which she finds physically demanding. She had been abusing alcohol since teenage years (aroung 52) and had been drinking daily on average several beers or a bottle of liquor. She has a hx of nausea/vomiting, tremor during withdrawal. She has no hx of withdrawal delirium but does have a hx of withdrawal seizures (on phenytoin since early June 2020 - had a seizure episode at that time). She has remained sober  Since October 2020 but would  occasionally smoke marijuana. She reports history of anxiety(excessive worrying)and describes sudden unrelated to any clear triggers with element of agoraphobia (withdrawn, not wanting to come out of  her room) which started about a year ago. She has a hx of depression with SI (no attempts). She was on fluoxetine 40 mg with no clear benefit. She was also prescribed benzodiazepines for panic anxiety in the past (diazepam) but also abused them by getting them off the streets to treat anxiety/withdrawal sx. She was started on duloxetine, hydroxyzine, topiramate and [facility] (detoxed). She reports feeling emotionally stable on current meds and reports no adverse effects. She only complains of fatigue. We have increased dose of Wellbutrin XL to 300 mg and while it helps with fatigue she noticed it makes her more easily irritable.   Dx: Mixed anxiety disorder (generalized, panic type); Alcoholuse disorder in early remission; MDD recurrentmild  Plan: Continue duloxetine (take in PM), change Wellbutrin tXL 300 mg in am to SR 200 mg,continue topiramate,naltrexone,gabapantin 100 mg (for anxiety),hydroxyzine prn anxiety and trazodone prn insomnia.I will also continue Robaxin which she takes for twitching, stuttering since [facility].Next visit intwomonths.The plan was discussed with patient who had an opportunity to ask questions and these were all answered. I spend15 minutesin phone consultation with the patient.    Magdalene Patricialgierd A Briunna Leicht, MD 03/24/2020, 11:45 AM

## 2020-04-04 ENCOUNTER — Ambulatory Visit: Payer: 59 | Admitting: Family Medicine

## 2020-04-05 ENCOUNTER — Telehealth (INDEPENDENT_AMBULATORY_CARE_PROVIDER_SITE_OTHER): Payer: 59 | Admitting: Family Medicine

## 2020-04-05 DIAGNOSIS — R52 Pain, unspecified: Secondary | ICD-10-CM

## 2020-04-05 DIAGNOSIS — R0981 Nasal congestion: Secondary | ICD-10-CM

## 2020-04-05 DIAGNOSIS — R059 Cough, unspecified: Secondary | ICD-10-CM

## 2020-04-05 DIAGNOSIS — R05 Cough: Secondary | ICD-10-CM | POA: Diagnosis not present

## 2020-04-05 NOTE — Patient Instructions (Signed)
  WORK SLIP:  Patient Natasha Pope,  Dec 09, 1995, was seen for a medical visit today. Please excuse from work according to the [facility] guidelines for a COVID like illness. We advise 10 days minimum from the onset of symptoms (04/01/2020) PLUS 1 day of no fever and improved symptoms. Will defer to employer for a sooner return to work if COVID19 testing is negative and the symptoms have resolved. Advise following CDC guidelines.    Sincerely: E-signature: Dr. Kriste Basque, DO [facility] Ph: [phone]   ----------------------------------------------------------------------------------------------------   HOME CARE TIPS:  -stay home while sick, and if you have COVID19 please stay home for a full 10 days since the onset of symptoms PLUS one day of no fever and feeling better  -[place] COVID19 testing information: [email or web address] [place] [phone]  -can use tylenol or aleve if needed for fevers, aches and pains per instructions  -can gargle warm salt water a few times per day for sore throat  -can use nasal saline a few times per day if nasal congestion, sometime a short course of Afrin nasal spray for 3 days can help as well  -stay hydrated, drink plenty of fluids and eat small healthy meals - avoid dairy  -can take 1000 IU Vit D3 and Vit C lozenges per instructions  -follow up with your doctor in 2-3 days unless improving and feeling better  I hope you are feeling better soon! Seek in-person care or a follow up telemedicine visit promptly if your symptoms worsen, new concerns arise or you are not improving as expected. Call 911 if severe symptoms.

## 2020-04-05 NOTE — Progress Notes (Signed)
Virtual Visit via Video Note  I connected with [name]  on 04/05/20 at  3:00 PM EDT by a video enabled telemedicine application and verified that I am speaking with the correct person using two identifiers.  Location patient: home, [place] Location provider:work or home office Persons participating in the virtual visit: patient, provider  I discussed the limitations of evaluation and management by telemedicine and the availability of in person appointments. The patient expressed understanding and agreed to proceed.   HPI:  Acute visit for resp illness: -started 4 days ago -symptoms include:  nasal congestion, cough, drainage in throat, body aches, feels more tired than usual -denies: fevers, CP, SOB, NVD, inability to get out of bed, inability to tol oral intake of fluids, dysuria -denies known sick contacts -not vaccinated for covid19 -FDLMP: 1 week ago  ROS: See pertinent positives and negatives per HPI.  Past Medical History:  Diagnosis Date  . Anxiety   . Asthma   . Depression   . Pyelonephritis   . Sepsis [facility])     Past Surgical History:  Procedure Laterality Date  . dental procedure      Family History  Problem Relation Age of Onset  . Depression Mother   . Miscarriages / [place] Mother   . Hypertension Father   . Alcohol abuse Father   . Learning disabilities Sister   . Hyperlipidemia Maternal Grandmother   . Alcohol abuse Maternal Grandfather   . Alcohol abuse Paternal Grandmother   . Hypertension Paternal Grandfather     SOCIAL HX: see hpi   Current Outpatient Medications:  .  acyclovir (ZOVIRAX) 400 MG tablet, Take 1 tablet (400 mg total) by mouth 3 (three) times daily., Disp: 15 tablet, Rfl: 0 .  buPROPion (WELLBUTRIN SR) 200 MG 12 hr tablet, Take 1 tablet (200 mg total) by mouth daily., Disp: 90 tablet, Rfl: 0 .  [START ON 05/11/2020] DULoxetine (CYMBALTA) 60 MG capsule, Take 1 capsule (60 mg total) by mouth daily., Disp: 90 capsule, Rfl: 0 .   fluticasone (FLONASE) 50 MCG/ACT nasal spray, Place 2 sprays into both nostrils daily. (Patient taking differently: Place 2 sprays into both nostrils daily as needed for allergies. ), Disp: 16 g, Rfl: 6 .  gabapentin (NEURONTIN) 100 MG capsule, Take 1 capsule (100 mg total) by mouth 2 (two) times daily., Disp: 60 capsule, Rfl: 1 .  hydrOXYzine (ATARAX/VISTARIL) 25 MG tablet, Take 1 tablet (25 mg total) by mouth 3 (three) times daily., Disp: 90 tablet, Rfl: 1 .  ibuprofen (ADVIL) 200 MG tablet, Take 1-2 tablets (200-400 mg total) by mouth 3 (three) times daily as needed for headache or moderate pain., Disp: 30 tablet, Rfl: 0 .  methocarbamol (ROBAXIN) 750 MG tablet, Take 1 tablet (750 mg total) by mouth 4 (four) times daily as needed for muscle spasms., Disp: 60 tablet, Rfl: 2 .  mupirocin ointment (BACTROBAN) 2 %, Apply 1 application topically 3 (three) times daily., Disp: 30 g, Rfl: 0 .  naltrexone (DEPADE) 50 MG tablet, Take 1 tablet (50 mg total) by mouth daily., Disp: 30 tablet, Rfl: 3 .  norethindrone-ethinyl estradiol (LOESTRIN FE) 1-20 MG-MCG tablet, Take 1 tablet by mouth daily., Disp: 3 Package, Rfl: 2 .  potassium chloride (KLOR-CON) 10 MEQ tablet, Take 1 tablet (10 mEq total) by mouth daily., Disp: 10 tablet, Rfl: 0 .  [START ON 05/16/2020] topiramate (TOPAMAX) 50 MG tablet, Take 1 tablet (50 mg total) by mouth daily., Disp: 90 tablet, Rfl: 0 .  traZODone (DESYREL)  100 MG tablet, Take 2 tablets (200 mg total) by mouth at bedtime as needed for sleep., Disp: 60 tablet, Rfl: 3 .  triamcinolone cream (KENALOG) 0.1 %, Apply 1 application topically 2 (two) times daily., Disp: , Rfl:   EXAM:  VITALS per patient if applicable:  GENERAL: alert, oriented, appears well and in no acute distress  HEENT: atraumatic, conjunttiva clear, no obvious abnormalities on inspection of external nose and ears, video visit inspection of oropharynx with mild post oropharyngeal erythema, no tonsillar enlargement  or exudate  NECK: normal movements of the head and neck  LUNGS: on inspection no signs of respiratory distress, breathing rate appears normal, no obvious gross SOB, gasping or wheezing  CV: no obvious cyanosis  MS: moves all visible extremities without noticeable abnormality, points all along R side of back in muscular tissues and R buttock as area of most body aches  PSYCH/NEURO: pleasant and cooperative, no obvious depression or anxiety, speech and thought processing grossly intact  ASSESSMENT AND PLAN:  Discussed the following assessment and plan:  Nasal congestion  Cough  Body aches  -we discussed possible serious and likely etiologies, options for evaluation and workup, limitations of telemedicine visit vs in person visit, treatment, treatment risks and precautions. Pt prefers to treat via telemedicine empirically rather then risking or undertaking an in person visit at this moment. Query viral illness vs COVID19 vs other. Possible influenza, though no fever and out of window for likely benefit from treatment. Discussed symptomatic care with aleve (says she can't take tylenol), gargles, nasal saline, vit D and C. Discussed COVID19 testing options and she signed up for a test slot through [facility] during the visit. Advised staying home while sick and per protocol if COVID19. Offered follow up and she does wish to see PCP in a few days for follow up and check in - sent message to PCP office to assist.  Work/School slipped offered:provided in patient instructions Advised to seek prompt follow up telemedicine visit or in person care if worsening, new symptoms arise, or if is not improving with treatment.    I discussed the assessment and treatment plan with the patient. The patient was provided an opportunity to ask questions and all were answered. The patient agreed with the plan and demonstrated an understanding of the instructions.      Terressa Koyanagi, DO

## 2020-04-07 ENCOUNTER — Other Ambulatory Visit: Payer: 59

## 2020-04-07 ENCOUNTER — Telehealth: Payer: 59 | Admitting: Family Medicine

## 2020-04-07 NOTE — Progress Notes (Deleted)
Patient: Natasha Pope MRN: [id number] DOB: Mar 17, 1996 PCP: Orland Mustard, MD     I connected with Natasha Pope on 04/07/20 at @CHLAPPTIME @ by a video enabled telemedicine application and verified that I am speaking with the correct person using two identifiers.  Location patient: Home Location provider: [facility], Office Persons participating in this virtual visit: ***  I discussed the limitations of evaluation and management by telemedicine and the availability of in person appointments. The patient expressed understanding and agreed to proceed.   Subjective:  No chief complaint on file.   HPI: The patient is a 24 y.o. female who presents today for ***  Review of Systems  Allergies Patient has No Known Allergies.  Past Medical History Patient  has a past medical history of Anxiety, Asthma, Depression, Pyelonephritis, and Sepsis (HCC).  Surgical History Patient  has a past surgical history that includes dental procedure.  Family History [name]'s family history includes Alcohol abuse in her father, maternal grandfather, and paternal grandmother; Depression in her mother; Hyperlipidemia in her maternal grandmother; Hypertension in her father and paternal grandfather; Learning disabilities in her sister; Miscarriages / Stillbirths in her mother.  Social History Patient  reports that she has been smoking cigarettes. She has been smoking about 0.25 packs per day. She has never used smokeless tobacco. She reports current alcohol use. She reports current drug use. Frequency: 21.00 times per week. Drug: Marijuana.    Objective: There were no vitals filed for this visit.  There is no height or weight on file to calculate BMI.  Physical Exam     Assessment/plan:      No follow-ups on file.  Records requested if needed. Time spent with patient: *** minutes, of which >50% was spent in obtaining information about her symptoms, reviweing her previous  labs, evaluations, and treatments, counseling her about her conditions (please see discussed topics above), and developing a plan to further investigate it; she had a number of questions which I addressed.    30, MD [facility]  04/07/2020

## 2020-04-08 ENCOUNTER — Other Ambulatory Visit: Payer: 59

## 2020-04-08 NOTE — Progress Notes (Signed)
No showed appointment.  Orland Mustard, MD [facility]

## 2020-04-13 ENCOUNTER — Other Ambulatory Visit: Payer: 59

## 2020-04-20 IMAGING — CR CHEST - 2 VIEW
2 series · 2 of 2 positions shown · non-contrast
Comparison: 11/15/2018

CLINICAL DATA: Seizure for 1-2 minutes in the shower this morning,
fall, headache, sore all over, central chest pain and shortness of
breath

EXAM:
CHEST - 2 VIEW

[w chest pa]
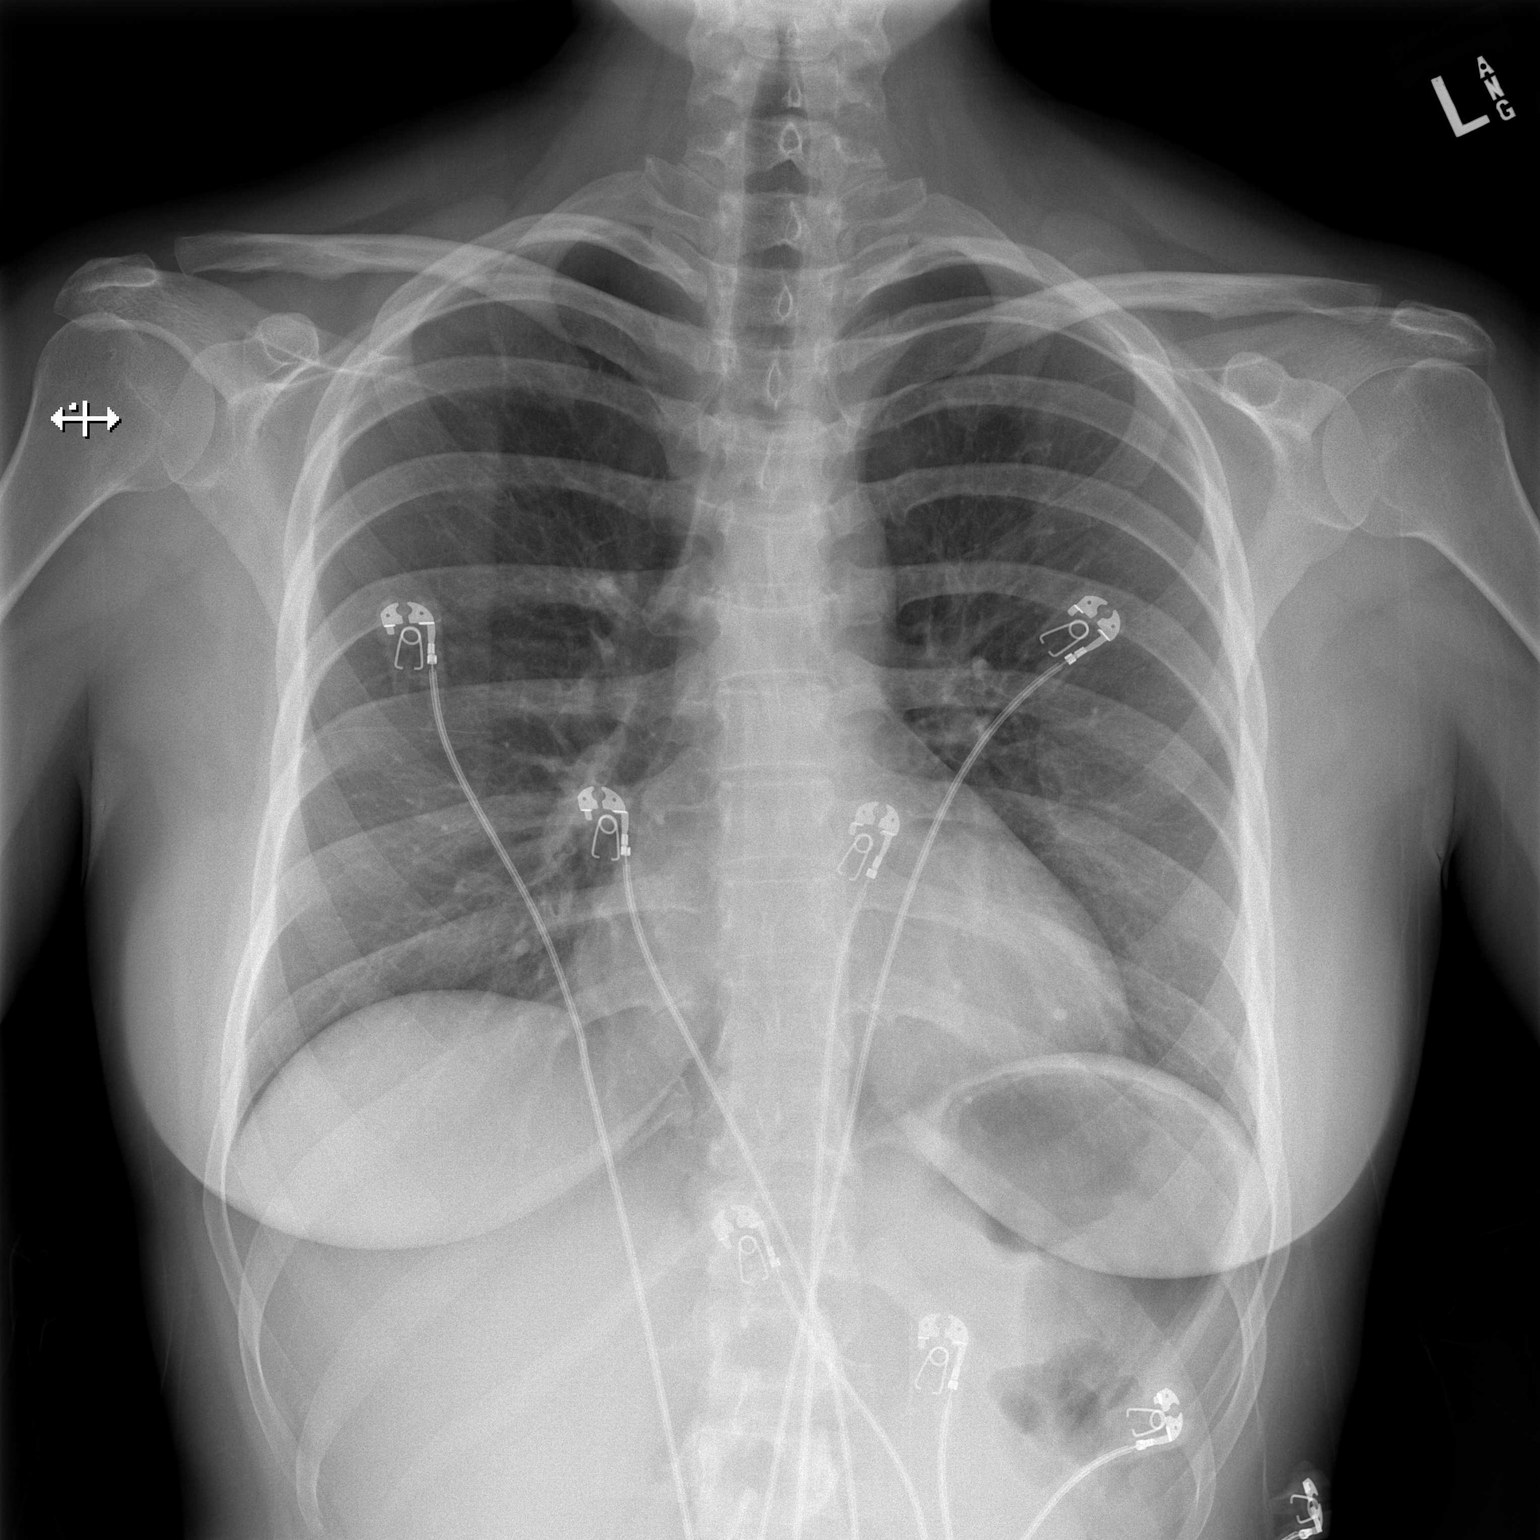

[w chest lat]
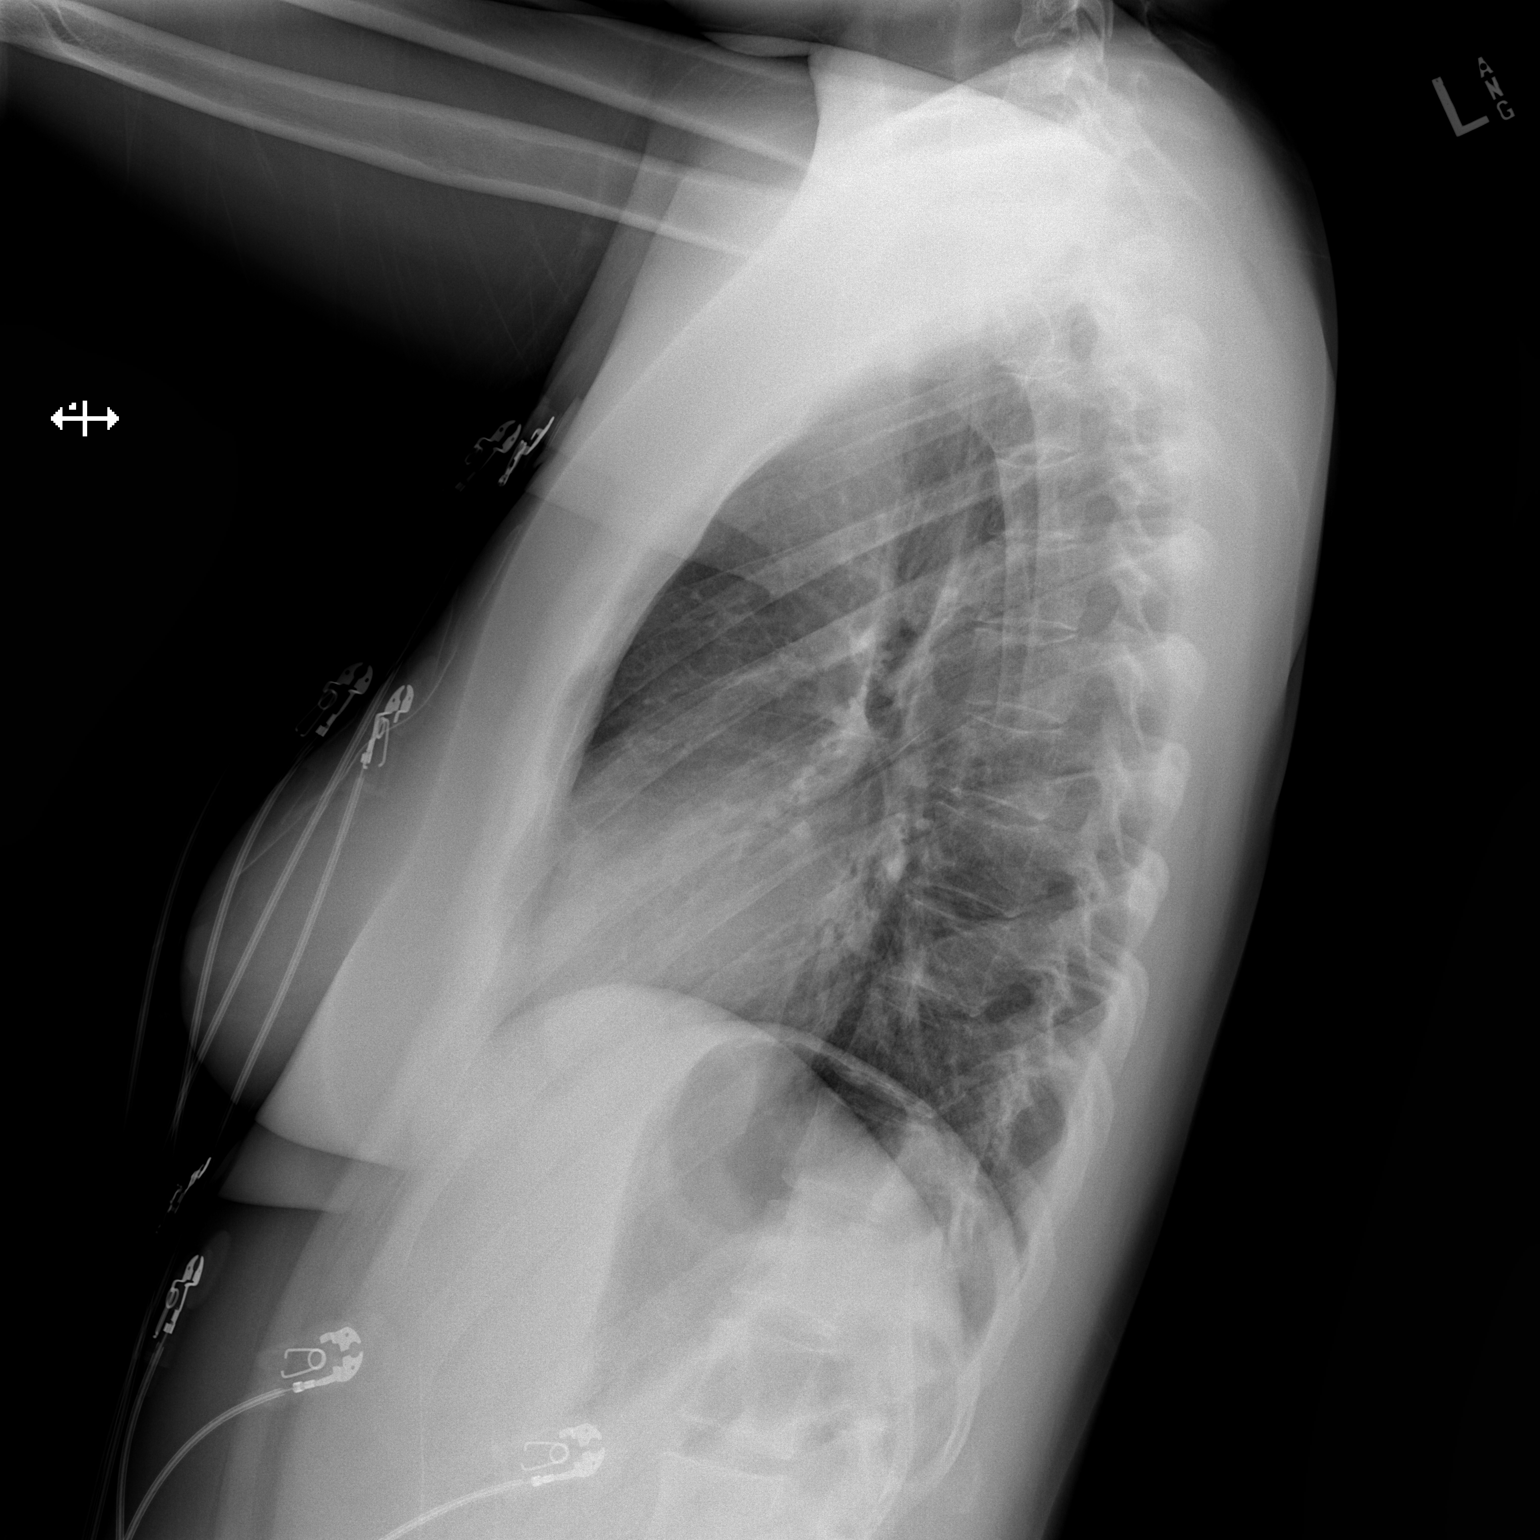

[2 of 2 positions shown; findings below may reference images not displayed]

FINDINGS: Normal heart size, mediastinal contours, and pulmonary vascularity.

Lungs clear.

No pleural effusion or pneumothorax.

Bones unremarkable.
IMPRESSION: Normal exam.

## 2020-04-20 IMAGING — CT CT HEAD WITHOUT CONTRAST
3 series · 14 of 45 positions shown, 16 images · non-contrast
Comparison: December 15, 2018

CLINICAL DATA: Seizure with headache

EXAM:
CT HEAD WITHOUT CONTRAST
TECHNIQUE: Contiguous axial images were obtained from the base of the skull
through the vertex without intravenous contrast.

[Series 2: head wo · axial · 0.43mm/px · z∈[-120,-5]mm · 8 of 28 slices shown, 10 images]
[im 3/28  brain]
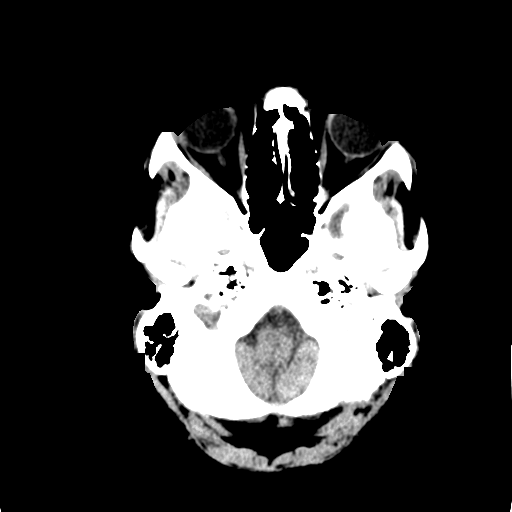
[im 3/28  bone]
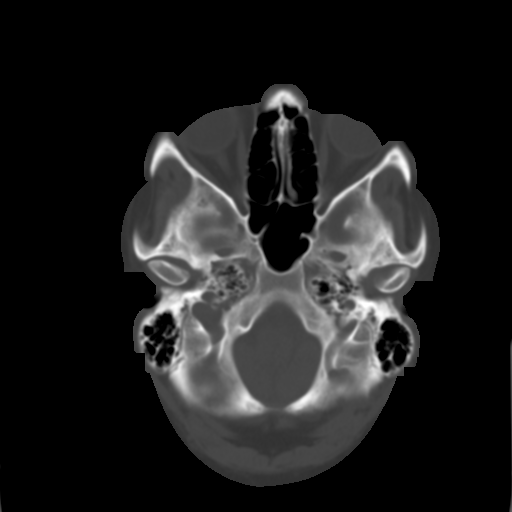
[im 6/28  brain]
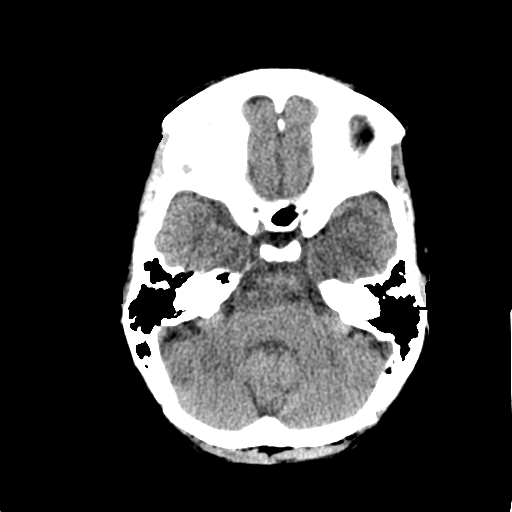
[im 10/28  brain]
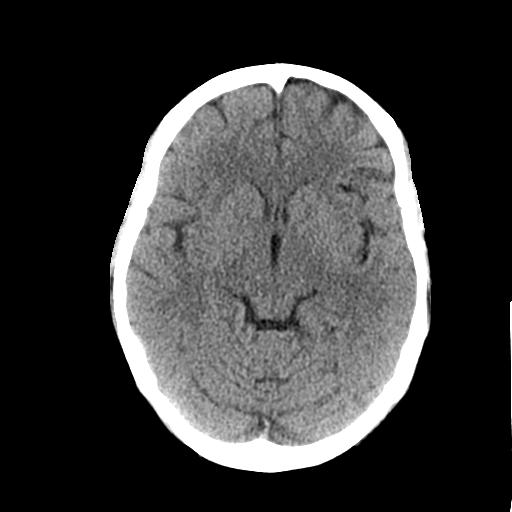
[im 13/28  brain]
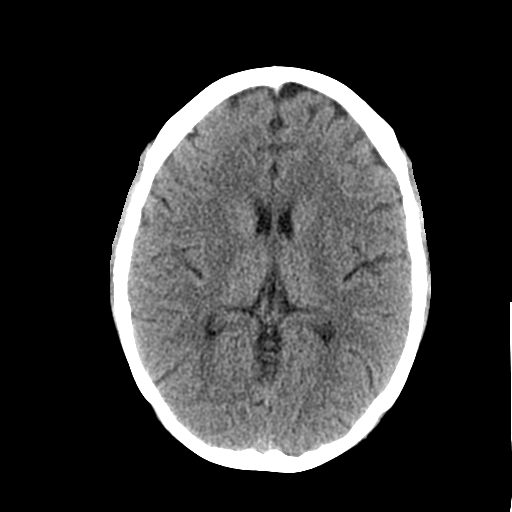
[im 16/28  brain]
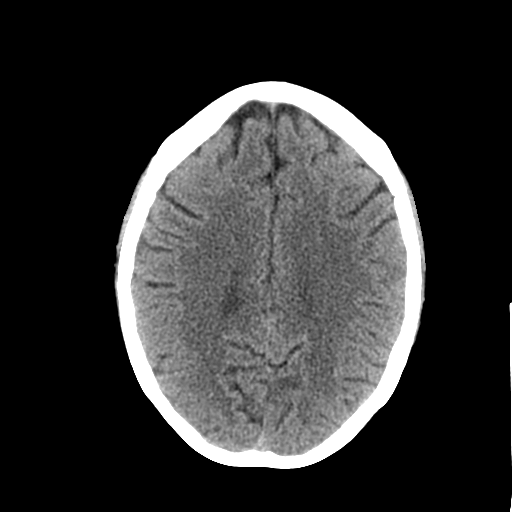
[im 16/28  bone]
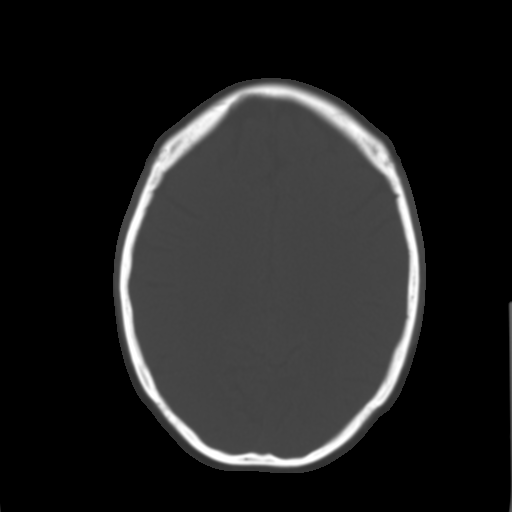
[im 19/28  brain]
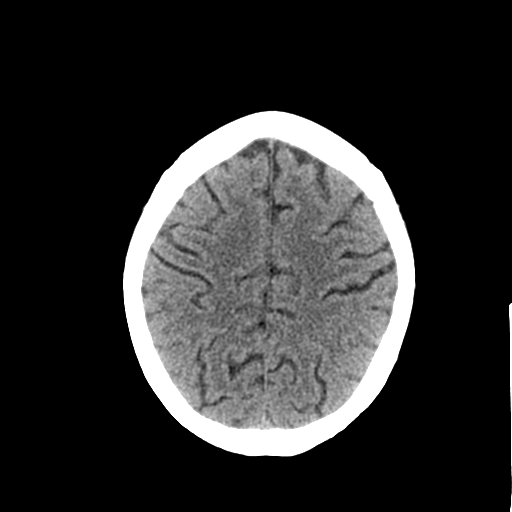
[im 23/28  brain]
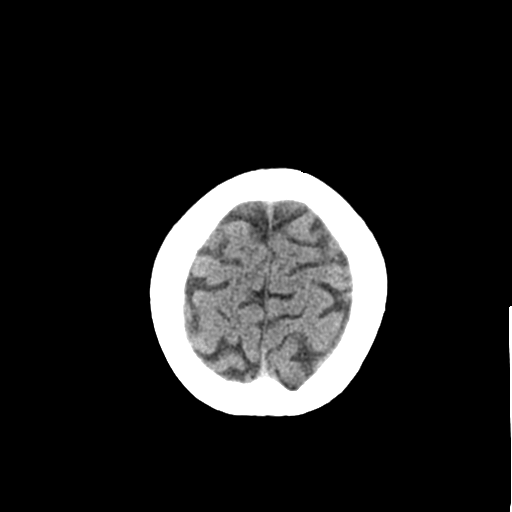
[im 26/28  brain]
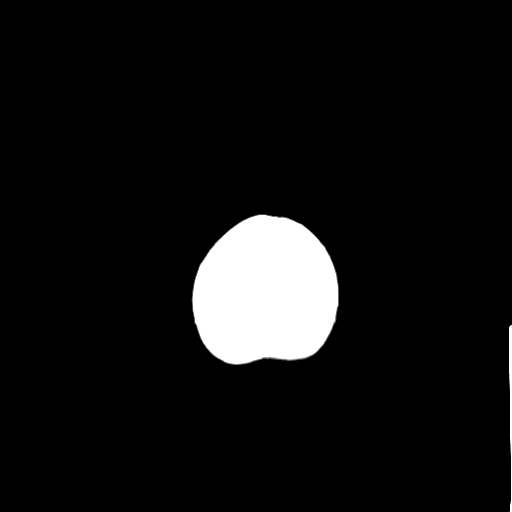

[Series 4: coronal soft tissue · coronal · 0.27mm/px · 3 of 75 slices shown]
[im 25/75  brain]
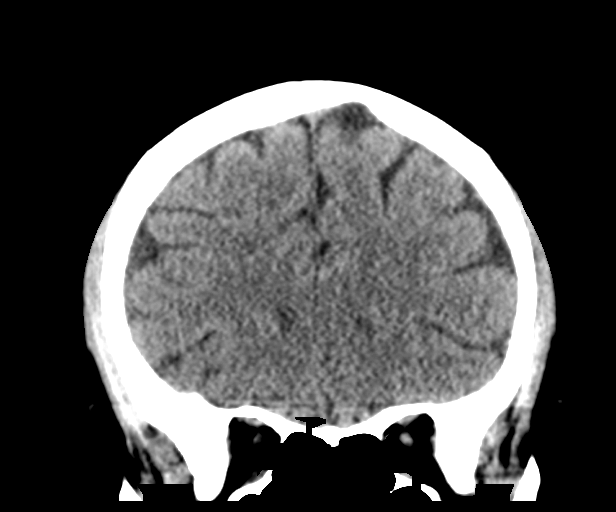
[im 33/75  brain]
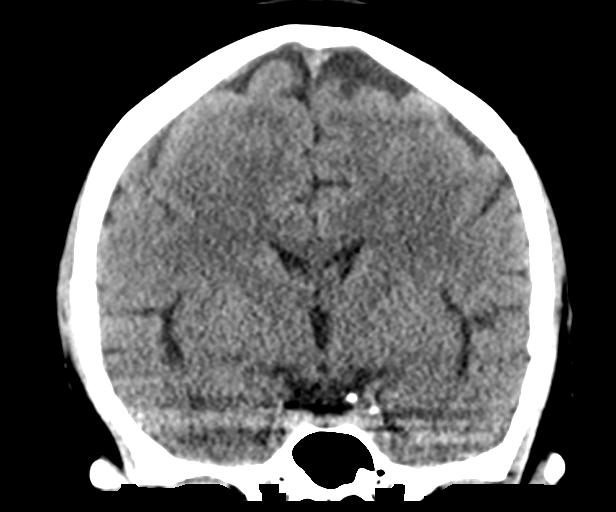
[im 42/75  brain]
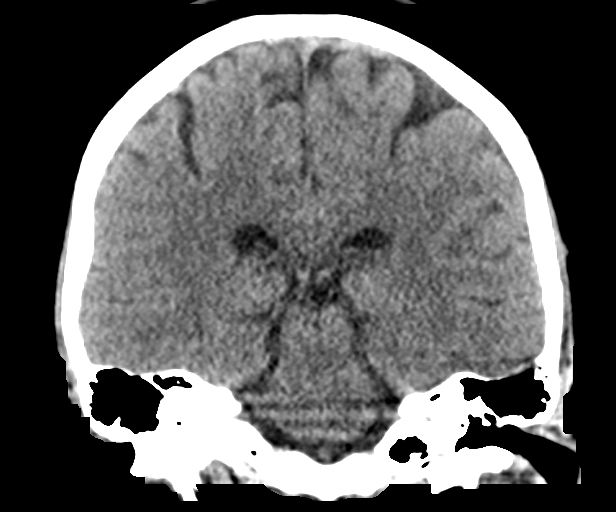

[Series 5: sagittal soft tissue · sagittal · 0.27mm/px · 3 of 65 slices shown]
[im 22/65  brain]
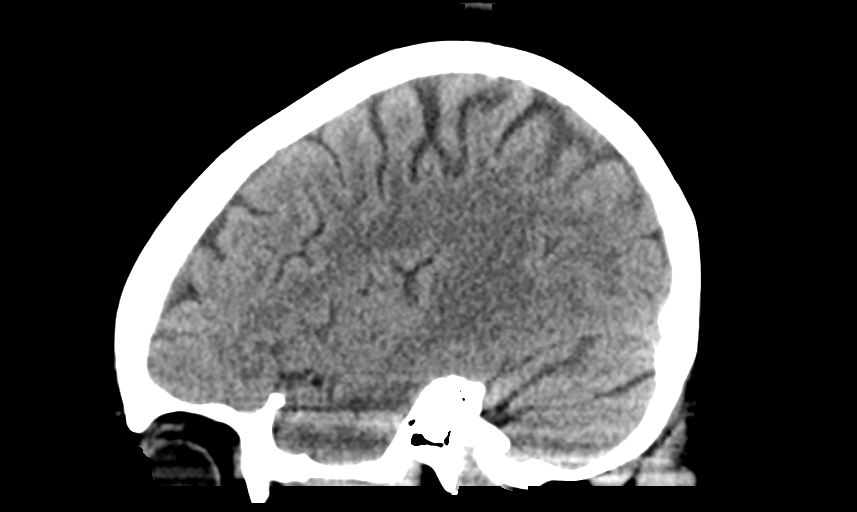
[im 33/65  brain]
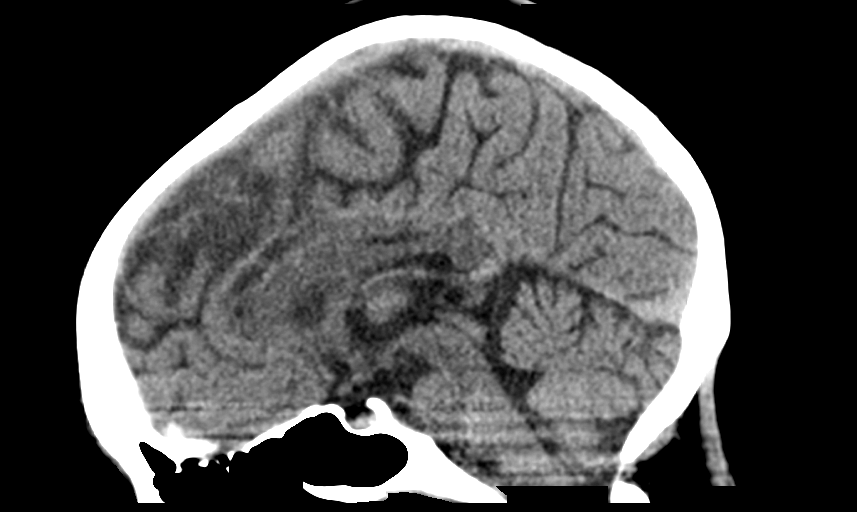
[im 43/65  brain]
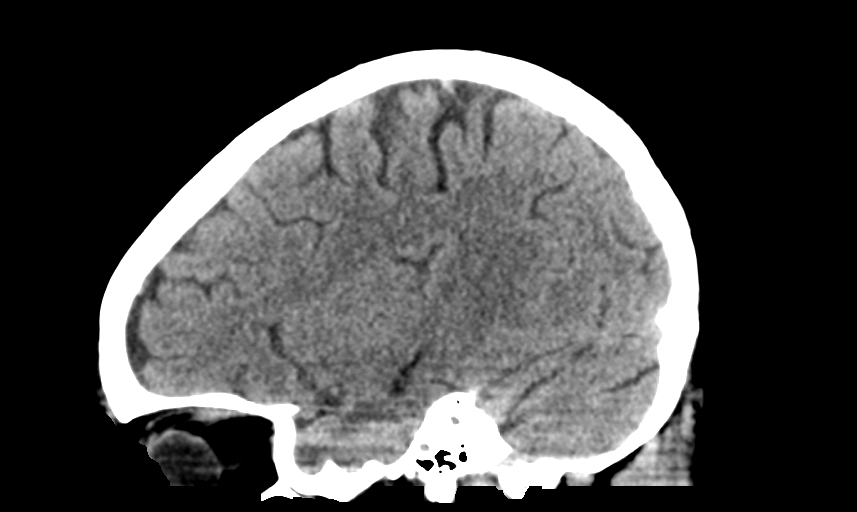

[14 of 45 positions shown; findings below may reference images not displayed]

FINDINGS: Brain: The ventricles are normal in size and configuration. There is
no intracranial mass, hemorrhage, extra-axial fluid collection, or
midline shift. Brain parenchyma appears unremarkable. No acute
infarct is evident.

Vascular: There is no hyperdense vessel. No vascular calcification
evident.

Skull: The bony calvarium appears intact.

Sinuses/Orbits: Visualized paranasal sinuses are clear. Visualized
orbits appear symmetric bilaterally.

Other: Mastoid air cells are clear.
IMPRESSION: Study within normal limits.

## 2020-04-25 ENCOUNTER — Encounter: Payer: 59 | Admitting: Family Medicine

## 2020-04-26 ENCOUNTER — Telehealth: Payer: Self-pay

## 2020-04-26 ENCOUNTER — Encounter: Payer: Self-pay | Admitting: Physician Assistant

## 2020-04-26 ENCOUNTER — Telehealth (INDEPENDENT_AMBULATORY_CARE_PROVIDER_SITE_OTHER): Payer: 59 | Admitting: Physician Assistant

## 2020-04-26 ENCOUNTER — Other Ambulatory Visit: Payer: Self-pay | Admitting: Physician Assistant

## 2020-04-26 ENCOUNTER — Telehealth: Payer: Self-pay | Admitting: Physician Assistant

## 2020-04-26 DIAGNOSIS — N6489 Other specified disorders of breast: Secondary | ICD-10-CM

## 2020-04-26 MED ORDER — TRIAMCINOLONE ACETONIDE 0.1 % EX CREA: TOPICAL_CREAM | CUTANEOUS | 0 refills | Status: DC

## 2020-04-26 NOTE — Telephone Encounter (Signed)
Please call and make sure patient is aware of her appointment at the breast center on Thursday at 2p.  [address], [place], [place] [address]  Also, please let her know that I have sent in topical ointment for her to trial on her breast. She has had a prior rx for this in the past so if she still has this at home she can use this and does not need to pick up rx (triamcinolone.)  If any fevers, chills, worsening symptoms between now and breast center appointment, she needs to call our office ASAP.  d

## 2020-04-26 NOTE — Telephone Encounter (Signed)
Nurse Assessment Nurse: Lane Hacker, RN, Elvin So Date/Time (Eastern Time): 04/26/2020 9:00:28 AM Confirm and document reason for call. If symptomatic, describe symptoms. ---Caller has states that she has a rash on her left breast. There are red dots around her nipple and it looks like her nipple is leaking clear fluid. The skin is broken, irritated, crusty, very painful. Red patch above the nipple now. -- Virtual visit at 12 pm. Does the patient have any new or worsening symptoms? ---Yes Will a triage be completed? ---No Select reason for no triage. ---Patient declined Please document clinical information provided and list any resource used. ---She got a virtual visit set for 12 pm and declined triage. Disp. Time Lamount Cohen Time) Disposition Final User 04/26/2020 8:49:23 AM Attempt made - message left Lane Hacker RN, Elvin So 04/26/2020 8:51:00 AM Attempt made - message left Emelda Fear 04/26/2020 9:02:13 AM Clinical Call Yes Lane Hacker, RN, [place]

## 2020-04-26 NOTE — Telephone Encounter (Signed)
Spoke to pt told her calling to make sure patient is aware of her appointment at the breast center on Thursday at 2p.[address], [place], [place] [address]. Pt verbalized understanding. Also, Lelon Mast has sent in topical ointment for her to trial on her breast. She has had a prior rx for this in the past so if she still has this at home she can use this and does not need to pick up rx (triamcinolone.). Pt verbalized understanding. If any fevers, chills, worsening symptoms between now and breast center appointment, she needs to call our office ASAP. Pt verbalized understanding.

## 2020-04-26 NOTE — Telephone Encounter (Signed)
Pt has a virtual visit today with Samantha.

## 2020-04-26 NOTE — Progress Notes (Signed)
Virtual Visit via Video   I connected with Natasha Pope on 04/26/20 at 12:00 PM EDT by a video enabled telemedicine application and verified that I am speaking with the correct person using two identifiers. Location patient: Home Location provider: [facility], Office Persons participating in the virtual visit: Natasha Pope, [place] PA-C.  I discussed the limitations of evaluation and management by telemedicine and the availability of in person appointments. The patient expressed understanding and agreed to proceed.  Subjective:   HPI:   Nipple pain She noticed first that her nipple was leaking clear fluid a few days ago. The day after she noticed this, she developed red dots around her left nipple that became crusty and painful. With time, she has had worsening redness.  Has never had issues with her breast in the past. Has tried warm compresses without much relief. Denies fever, chills, malaise, personal or family hx of breast cancer. First day of last period was last week sometime, denies concerns for pregnancy.  No LMP recorded.  ROS: See pertinent positives and negatives per HPI.  Patient Active Problem List   Diagnosis Date Noted  . 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 Delusional disorder (HCC)   . Cocaine use disorder, moderate, in early remission (HCC) 08/13/2019  . 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/2019  . 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 Alcohol withdrawal (HCC) 01/10/2019  . Alcohol use disorder, severe, in early remission, dependence (HCC) 12/27/2018  . Recurrent major depressive disorder (HCC) 12/27/2018  . Seizure (HCC) 12/26/2018  . Alcohol use with alcohol-induced mood disorder (HCC)   . Substance induced mood disorder (HCC) 12/09/2018  . Alcohol abuse 12/09/2018  . Macrocytic anemia 05/19/2018  . Thrombocytopenia (HCC) 05/19/2018  . ASCUS of cervix with  negative high risk HPV 03/07/2018  . Tobacco abuse 01/31/2018  . Asthma 01/31/2018  . GAD (generalized anxiety disorder) 01/31/2018    Social History   Tobacco Use  . Smoking status: Light Tobacco Smoker    Packs/day: 0.25    Types: Cigarettes    Last attempt to quit: 05/10/2018    Years since quitting: 1.9  . Smokeless tobacco: Never Used  . Tobacco comment: mixes with marijuana  Substance Use Topics  . Alcohol use: Yes    Comment: 2-3 days/week    Current Outpatient Medications:  .  acyclovir (ZOVIRAX) 400 MG tablet, Take 1 tablet (400 mg total) by mouth 3 (three) times daily., Disp: 15 tablet, Rfl: 0 .  buPROPion (WELLBUTRIN SR) 200 MG 12 hr tablet, Take 1 tablet (200 mg total) by mouth daily., Disp: 90 tablet, Rfl: 0 .  [START ON 05/11/2020] DULoxetine (CYMBALTA) 60 MG capsule, Take 1 capsule (60 mg total) by mouth daily., Disp: 90 capsule, Rfl: 0 .  fluticasone (FLONASE) 50 MCG/ACT nasal spray, Place 2 sprays into both nostrils daily. (Patient taking differently: Place 2 sprays into both nostrils daily as needed for allergies. ), Disp: 16 g, Rfl: 6 .  gabapentin (NEURONTIN) 100 MG capsule, Take 1 capsule (100 mg total) by mouth 2 (two) times daily., Disp: 60 capsule, Rfl: 1 .  hydrOXYzine (ATARAX/VISTARIL) 25 MG tablet, Take 1 tablet (25 mg total) by mouth 3 (three) times daily., Disp: 90 tablet, Rfl: 1 .  ibuprofen (ADVIL) 200 MG tablet, Take 1-2 tablets (200-400 mg total) by mouth 3 (three) times daily as needed for headache or moderate pain., Disp: 30 tablet, Rfl: 0 .  methocarbamol (  ROBAXIN) 750 MG tablet, Take 1 tablet (750 mg total) by mouth 4 (four) times daily as needed for muscle spasms., Disp: 60 tablet, Rfl: 2 .  mupirocin ointment (BACTROBAN) 2 %, Apply 1 application topically 3 (three) times daily., Disp: 30 g, Rfl: 0 .  naltrexone (DEPADE) 50 MG tablet, Take 1 tablet (50 mg total) by mouth daily., Disp: 30 tablet, Rfl: 3 .  norethindrone-ethinyl estradiol (LOESTRIN  FE) 1-20 MG-MCG tablet, Take 1 tablet by mouth daily., Disp: 3 Package, Rfl: 2 .  potassium chloride (KLOR-CON) 10 MEQ tablet, Take 1 tablet (10 mEq total) by mouth daily., Disp: 10 tablet, Rfl: 0 .  [START ON 05/16/2020] topiramate (TOPAMAX) 50 MG tablet, Take 1 tablet (50 mg total) by mouth daily., Disp: 90 tablet, Rfl: 0 .  traZODone (DESYREL) 100 MG tablet, Take 2 tablets (200 mg total) by mouth at bedtime as needed for sleep., Disp: 60 tablet, Rfl: 3 .  triamcinolone cream (KENALOG) 0.1 %, Apply 1 application topically 2 (two) times daily., Disp: , Rfl:   No Known Allergies  Objective:   VITALS: Per patient if applicable, see vitals. GENERAL: Alert, appears well and in no acute distress. HEENT: Atraumatic, conjunctiva clear, no obvious abnormalities on inspection of external nose and ears. NECK: Normal movements of the head and neck. CARDIOPULMONARY: No increased WOB. Speaking in clear sentences. I:E ratio WNL.  MS: Moves all visible extremities without noticeable abnormality. PSYCH: Pleasant and cooperative, well-groomed. Speech normal rate and rhythm. Affect is appropriate. Insight and judgement are appropriate. Attention is focused, linear, and appropriate.  NEURO: CN grossly intact. Oriented as arrived to appointment on time with no prompting. Moves both UE equally.  SKIN: Significant erythema surrounding circumference of L nipple, appearance of swollen nipple    Assessment and Plan:   Natasha Pope was seen today for nipple pain.  Diagnoses and all orders for this visit:  Nipple crusting -     US BREAST LTD UNI LEFT INC AXILLA; Future   No systemic symptoms per patient. Differential includes: eczema, abscess, malignancy, other Will order urgent imaging at [facility]. Trial topical triamcinolone to area, to see if this will help in the interim. Should new symptoms develop, such as fever, chills, purulent discharge, or other concerns, she was instructed to call our office.     I discussed the assessment and treatment plan with the patient. The patient was provided an opportunity to ask questions and all were answered. The patient agreed with the plan and demonstrated an understanding of the instructions.   The patient was advised to call back or seek an in-person evaluation if the symptoms worsen or if the condition fails to improve as anticipated.   CMA or LPN served as scribe during this visit. History, Physical, and Plan performed by medical provider. The above documentation has been reviewed and is accurate and complete.  [place], [place] 04/26/2020

## 2020-04-28 ENCOUNTER — Ambulatory Visit
Admission: RE | Admit: 2020-04-28 | Discharge: 2020-04-28 | Disposition: A | Discharge status: Home / Self Care | Payer: 59 | Source: Ambulatory Visit | Attending: Physician Assistant | Admitting: Physician Assistant

## 2020-04-28 ENCOUNTER — Other Ambulatory Visit: Payer: Self-pay

## 2020-04-28 DIAGNOSIS — N6489 Other specified disorders of breast: Secondary | ICD-10-CM

## 2020-05-02 ENCOUNTER — Encounter: Payer: 59 | Admitting: Family Medicine

## 2020-05-04 ENCOUNTER — Other Ambulatory Visit: Payer: Self-pay

## 2020-05-04 ENCOUNTER — Encounter: Payer: Self-pay | Admitting: Family Medicine

## 2020-05-04 ENCOUNTER — Ambulatory Visit: Payer: 59 | Admitting: Family Medicine

## 2020-05-04 VITALS — BP 128/87 | HR 103 | Temp 97.9°F | Ht 66.0 in | Wt 141.4 lb

## 2020-05-04 DIAGNOSIS — Z0000 Encounter for general adult medical examination without abnormal findings: Secondary | ICD-10-CM | POA: Diagnosis not present

## 2020-05-04 DIAGNOSIS — Z23 Encounter for immunization: Secondary | ICD-10-CM | POA: Diagnosis not present

## 2020-05-04 MED ORDER — NORETHIN ACE-ETH ESTRAD-FE 1-20 MG-MCG PO TABS
1.0000 | ORAL_TABLET | Freq: Every day | ORAL | 1 refills | Status: DC
Start: 2020-05-04 — End: 2020-11-01

## 2020-05-04 MED ORDER — TRIAMCINOLONE ACETONIDE 0.1 % EX CREA
TOPICAL_CREAM | CUTANEOUS | 0 refills | Status: DC
Start: 2020-05-04 — End: 2022-05-03

## 2020-05-04 NOTE — Patient Instructions (Signed)
-pap smear next year! -tdap today (tetanus)   Routine lab work.  -refilled birth control and your steroid cream for breast/knee   You look great! Keep up the good work!    Preventive Care 23-23 Years Old, Female Preventive care refers to visits with your health care provider and lifestyle choices that can promote health and wellness. This includes:  A yearly physical exam. This may also be called an annual well check.  Regular dental visits and eye exams.  Immunizations.  Screening for certain conditions.  Healthy lifestyle choices, such as eating a healthy diet, getting regular exercise, not using drugs or products that contain nicotine and tobacco, and limiting alcohol use. What can I expect for my preventive care visit? Physical exam Your health care provider will check your:  Height and weight. This may be used to calculate body mass index (BMI), which tells if you are at a healthy weight.  Heart rate and blood pressure.  Skin for abnormal spots. Counseling Your health care provider may ask you questions about your:  Alcohol, tobacco, and drug use.  Emotional well-being.  Home and relationship well-being.  Sexual activity.  Eating habits.  Work and work environment.  Method of birth control.  Menstrual cycle.  Pregnancy history. What immunizations do I need?  Influenza (flu) vaccine  This is recommended every year. Tetanus, diphtheria, and pertussis (Tdap) vaccine  You may need a Td booster every 10 years. Varicella (chickenpox) vaccine  You may need this if you have not been vaccinated. Human papillomavirus (HPV) vaccine  If recommended by your health care provider, you may need three doses over 6 months. Measles, mumps, and rubella (MMR) vaccine  You may need at least one dose of MMR. You may also need a second dose. Meningococcal conjugate (MenACWY) vaccine  One dose is recommended if you are age 19-21 years and a first-year college student  living in a residence hall, or if you have one of several medical conditions. You may also need additional booster doses. Pneumococcal conjugate (PCV13) vaccine  You may need this if you have certain conditions and were not previously vaccinated. Pneumococcal polysaccharide (PPSV23) vaccine  You may need one or two doses if you smoke cigarettes or if you have certain conditions. Hepatitis A vaccine  You may need this if you have certain conditions or if you travel or work in places where you may be exposed to hepatitis A. Hepatitis B vaccine  You may need this if you have certain conditions or if you travel or work in places where you may be exposed to hepatitis B. Haemophilus influenzae type b (Hib) vaccine  You may need this if you have certain conditions. You may receive vaccines as individual doses or as more than one vaccine together in one shot (combination vaccines). Talk with your health care provider about the risks and benefits of combination vaccines. What tests do I need?  Blood tests  Lipid and cholesterol levels. These may be checked every 5 years starting at age 20.  Hepatitis C test.  Hepatitis B test. Screening  Diabetes screening. This is done by checking your blood sugar (glucose) after you have not eaten for a while (fasting).  Sexually transmitted disease (STD) testing.  BRCA-related cancer screening. This may be done if you have a family history of breast, ovarian, tubal, or peritoneal cancers.  Pelvic exam and Pap test. This may be done every 3 years starting at age 21. Starting at age 30, this may be done   every 5 years if you have a Pap test in combination with an HPV test. Talk with your health care provider about your test results, treatment options, and if necessary, the need for more tests. Follow these instructions at home: Eating and drinking   Eat a diet that includes fresh fruits and vegetables, whole grains, lean protein, and low-fat  dairy.  Take vitamin and mineral supplements as recommended by your health care provider.  Do not drink alcohol if: ? Your health care provider tells you not to drink. ? You are pregnant, may be pregnant, or are planning to become pregnant.  If you drink alcohol: ? Limit how much you have to 0-1 drink a day. ? Be aware of how much alcohol is in your drink. In the [place] one drink equals one 12 oz bottle of beer (355 mL), one 5 oz glass of wine (148 mL), or one 1 oz glass of hard liquor (44 mL). Lifestyle  Take daily care of your teeth and gums.  Stay active. Exercise for at least 30 minutes on 5 or more days each week.  Do not use any products that contain nicotine or tobacco, such as cigarettes, e-cigarettes, and chewing tobacco. If you need help quitting, ask your health care provider.  If you are sexually active, practice safe sex. Use a condom or other form of birth control (contraception) in order to prevent pregnancy and STIs (sexually transmitted infections). If you plan to become pregnant, see your health care provider for a preconception visit. What's next?  Visit your health care provider once a year for a well check visit.  Ask your health care provider how often you should have your eyes and teeth checked.  Stay up to date on all vaccines. This information is not intended to replace advice given to you by your health care provider. Make sure you discuss any questions you have with your health care provider. Document Revised: 03/20/2018 Document Reviewed: 03/20/2018 Elsevier Patient Education  2020 Elsevier Inc.  

## 2020-05-04 NOTE — Progress Notes (Signed)
Patient: Natasha Pope MRN: [id number] DOB: Jun 10, 1996 PCP: Orma Flaming, MD     Subjective:  Chief Complaint  Patient presents with  . Annual Exam    HPI: The patient is a 24 y.o. female who presents today for annual exam. She denies any changes to past medical history. There have been no recent hospitalizations. They are following a well balanced diet and does not have an exercise plan. Pt does not work out (states she works out at her job) and plays with her cat. Weight has been increasing steadily. No complaints today. Pt says that she has a new job at [employer]. She also says that she has moved out of previous living arrangements. She is still at a hotel.   No family history of colon cancer or breast cancer.   Did re establish with her psychiatrist who she has seen and is on her medication for depression/anxiety and alcohol abuse. Admits to drinking yesterday to celebrate new job.   Immunization History  Administered Date(s) Administered  . DTaP 01/19/1997, 04/01/1997, 05/31/1997, 01/13/2002  . Hepatitis B 12-May-1997, 01/19/1997, 04/01/1997, 05/31/1997  . HiB (PRP-OMP) 01/19/1997, 04/01/1997  . IPV 01/19/1997, 04/01/1997, 01/13/2002  . MMR 01/13/2002, 03/08/2008  . Meningococcal Conjugate 03/08/2008  . Td 03/08/2008  . Tdap 03/08/2008, 05/04/2020  . Varicella 03/08/2008   Colonoscopy: routine screening  Mammogram: routine screening  Pap smear: 02/2018. Ascus, but negative HPV. Repeat in 3 years per guidelines.    Review of Systems  Constitutional: Negative for chills, fatigue and fever.  HENT: Negative for congestion, dental problem, ear pain, hearing loss, sore throat and trouble swallowing.   Eyes: Negative for visual disturbance.  Respiratory: Negative for cough, chest tightness and shortness of breath.   Cardiovascular: Negative for chest pain, palpitations and leg swelling.  Gastrointestinal: Negative for abdominal pain, blood in stool, diarrhea and nausea.   Endocrine: Negative for cold intolerance, polydipsia, polyphagia and polyuria.  Genitourinary: Negative for dysuria, frequency, hematuria and pelvic pain.  Musculoskeletal: Negative for arthralgias and back pain.  Skin: Negative for rash.  Neurological: Negative for dizziness and headaches.  Psychiatric/Behavioral: Negative for dysphoric mood and sleep disturbance. The patient is not nervous/anxious.     Allergies Patient has No Known Allergies.  Past Medical History Patient  has a past medical history of Anxiety, Asthma, Depression, Pyelonephritis, and Sepsis ([place]).  Surgical History Patient  has a past surgical history that includes dental procedure.  Family History [name]'s family history includes Alcohol abuse in her father, maternal grandfather, and paternal grandmother; Depression in her mother; Hyperlipidemia in her maternal grandmother; Hypertension in her father and paternal grandfather; Learning disabilities in her sister; Miscarriages / Stillbirths in her mother.  Social History Patient  reports that she has been smoking cigarettes. She has been smoking about 0.25 packs per day. She has never used smokeless tobacco. She reports current alcohol use. She reports current drug use. Frequency: 21.00 times per week. Drug: Marijuana.    Objective: Vitals:   05/04/20 0907 05/04/20 0959  BP: (!) 133/97 128/87  Pulse: (!) 103   Temp: 97.9 F (36.6 C)   TempSrc: Temporal   SpO2: 96%   Weight: 141 lb 6.4 oz (64.1 kg)   Height: $Remove'5\' 6"'eyFlDCM$  (1.676 m)     Body mass index is 22.82 kg/m.  Physical Exam Vitals reviewed.  Constitutional:      Appearance: Normal appearance. She is well-developed and normal weight.  HENT:     Head: Normocephalic and atraumatic.  Right Ear: Tympanic membrane, ear canal and external ear normal.     Left Ear: Tympanic membrane, ear canal and external ear normal.     Nose: Nose normal.     Mouth/Throat:     Mouth: Mucous membranes are moist.   Eyes:     Extraocular Movements: Extraocular movements intact.     Conjunctiva/sclera: Conjunctivae normal.     Pupils: Pupils are equal, round, and reactive to light.  Neck:     Thyroid: No thyromegaly.  Cardiovascular:     Rate and Rhythm: Normal rate and regular rhythm.     Heart sounds: Normal heart sounds. No murmur heard.   Pulmonary:     Effort: Pulmonary effort is normal.     Breath sounds: Normal breath sounds.  Abdominal:     General: Abdomen is flat. Bowel sounds are normal. There is no distension.     Palpations: Abdomen is soft.     Tenderness: There is no abdominal tenderness.  Musculoskeletal:     Cervical back: Normal range of motion and neck supple.  Lymphadenopathy:     Cervical: No cervical adenopathy.  Skin:    General: Skin is warm and dry.     Capillary Refill: Capillary refill takes less than 2 seconds.     Findings: No rash.     Comments: Rash on breast appears resolved.   Neurological:     General: No focal deficit present.     Mental Status: She is alert and oriented to person, place, and time.     Cranial Nerves: No cranial nerve deficit.     Coordination: Coordination normal.     Deep Tendon Reflexes: Reflexes normal.  Psychiatric:        Mood and Affect: Mood normal.        Behavior: Behavior normal.      Office Visit from 05/04/2020 in [facility]  PHQ-2 Total Score 0          Assessment/plan: 1. Annual physical exam Routine, fasting labs today. HM reviewed and UTD. Will give tdap today, declines flu shot. Looks really good and proud of her. Hopefully will do well at job and continue to abstain from alcohol. Continue to follow with psychiatry for anxiety/depression.  Patient counseling [x]    Nutrition: Stressed importance of moderation in sodium/caffeine intake, saturated fat and cholesterol, caloric balance, sufficient intake of fresh fruits, vegetables, fiber, calcium, iron, and 1 mg of folate supplement per day  (for females capable of pregnancy).  [x]    Stressed the importance of regular exercise.   []    Substance Abuse: Discussed cessation/primary prevention of tobacco, alcohol, or other drug use; driving or other dangerous activities under the influence; availability of treatment for abuse.   [x]    Injury prevention: Discussed safety belts, safety helmets, smoke detector, smoking near bedding or upholstery.   [x]    Sexuality: Discussed sexually transmitted diseases, partner selection, use of condoms, avoidance of unintended pregnancy  and contraceptive alternatives.  [x]    Dental health: Discussed importance of regular tooth brushing, flossing, and dental visits.  [x]    Health maintenance and immunizations reviewed. Please refer to Health maintenance section.    - CBC with Differential/Platelet; Future - Lipid panel; Future - TSH; Future - COMPLETE METABOLIC PANEL WITH GFR; Future - COMPLETE METABOLIC PANEL WITH GFR - TSH - Lipid panel - CBC with Differential/Platelet  2. Need for Tdap vaccination  - Tdap vaccine greater than or equal to 7yo IM  This visit occurred during the SARS-CoV-2 public health emergency.  Safety protocols were in place, including screening questions prior to the visit, additional usage of staff PPE, and extensive cleaning of exam room while observing appropriate contact time as indicated for disinfecting solutions.     Return in about 1 year (around 05/04/2021) for pap smear/annual .     Orma Flaming, MD [place]  05/04/2020

## 2020-05-05 LAB — CBC WITH DIFFERENTIAL/PLATELET
Absolute Monocytes: 476 cells/uL (ref 200–950)
Basophils Absolute: 31 cells/uL (ref 0–200)
Basophils Relative: 0.5 %
Eosinophils Absolute: 153 cells/uL (ref 15–500)
Eosinophils Relative: 2.5 %
HCT: 41.6 % (ref 35.0–45.0)
Hemoglobin: 13.7 g/dL (ref 11.7–15.5)
Lymphs Abs: 1873 cells/uL (ref 850–3900)
MCH: 31.9 pg (ref 27.0–33.0)
MCHC: 32.9 g/dL (ref 32.0–36.0)
MCV: 97 fL (ref 80.0–100.0)
MPV: 9.4 fL (ref 7.5–12.5)
Monocytes Relative: 7.8 %
Neutro Abs: 3569 cells/uL (ref 1500–7800)
Neutrophils Relative %: 58.5 %
Platelets: 280 10*3/uL (ref 140–400)
RBC: 4.29 10*6/uL (ref 3.80–5.10)
RDW: 11.8 % (ref 11.0–15.0)
Total Lymphocyte: 30.7 %
WBC: 6.1 10*3/uL (ref 3.8–10.8)

## 2020-05-05 LAB — COMPLETE METABOLIC PANEL WITH GFR
AG Ratio: 1.6 (calc) (ref 1.0–2.5)
ALT: 8 U/L (ref 6–29)
AST: 12 U/L (ref 10–30)
Albumin: 4.2 g/dL (ref 3.6–5.1)
Alkaline phosphatase (APISO): 76 U/L (ref 31–125)
BUN: 8 mg/dL (ref 7–25)
CO2: 25 mmol/L (ref 20–32)
Calcium: 9.4 mg/dL (ref 8.6–10.2)
Chloride: 107 mmol/L (ref 98–110)
Creat: 0.76 mg/dL (ref 0.50–1.10)
GFR, Est African American: 128 mL/min/{1.73_m2} (ref >=60)
GFR, Est Non African American: 111 mL/min/{1.73_m2} (ref >=60)
Globulin: 2.7 g/dL (calc) (ref 1.9–3.7)
Glucose, Bld: 74 mg/dL (ref 65–99)
Potassium: 4.6 mmol/L (ref 3.5–5.3)
Sodium: 140 mmol/L (ref 135–146)
Total Bilirubin: 0.3 mg/dL (ref 0.2–1.2)
Total Protein: 6.9 g/dL (ref 6.1–8.1)

## 2020-05-05 LAB — LIPID PANEL
Cholesterol: 254 mg/dL — ABNORMAL HIGH (ref <200)
HDL: 122 mg/dL (ref >=50)
LDL Cholesterol (Calc): 112 mg/dL (calc) — ABNORMAL HIGH
Non-HDL Cholesterol (Calc): 132 mg/dL (calc) — ABNORMAL HIGH (ref <130)
Total CHOL/HDL Ratio: 2.1 (calc) (ref <5.0)
Triglycerides: 95 mg/dL (ref <150)

## 2020-05-05 LAB — TSH: TSH: 1.32 mIU/L

## 2020-05-13 ENCOUNTER — Other Ambulatory Visit: Payer: Self-pay | Admitting: Family Medicine

## 2020-05-24 ENCOUNTER — Other Ambulatory Visit: Payer: Self-pay | Admitting: Family Medicine

## 2020-05-27 ENCOUNTER — Other Ambulatory Visit (HOSPITAL_COMMUNITY): Payer: Self-pay | Admitting: Psychiatry

## 2020-05-27 ENCOUNTER — Telehealth (HOSPITAL_COMMUNITY): Payer: 59 | Admitting: Psychiatry

## 2020-05-27 ENCOUNTER — Other Ambulatory Visit: Payer: Self-pay

## 2020-05-27 MED ORDER — BUPROPION HCL ER (SR) 200 MG PO TB12: 200.0000 mg | ORAL_TABLET | Freq: Every day | ORAL | 0 refills | Status: DC

## 2020-05-27 MED ORDER — TRAZODONE HCL 100 MG PO TABS
200.0000 mg | ORAL_TABLET | Freq: Every evening | ORAL | 3 refills | Status: DC | PRN
Start: 2020-06-17 — End: 2021-02-13

## 2020-05-27 MED ORDER — HYDROXYZINE HCL 25 MG PO TABS
25.0000 mg | ORAL_TABLET | Freq: Three times a day (TID) | ORAL | 1 refills | Status: DC
Start: 2020-05-27 — End: 2020-07-04

## 2020-07-01 ENCOUNTER — Other Ambulatory Visit (HOSPITAL_COMMUNITY): Payer: Self-pay | Admitting: Psychiatry

## 2020-07-04 ENCOUNTER — Telehealth: Payer: Self-pay

## 2020-07-04 ENCOUNTER — Other Ambulatory Visit (HOSPITAL_COMMUNITY): Payer: Self-pay | Admitting: Psychiatry

## 2020-07-04 ENCOUNTER — Telehealth (HOSPITAL_COMMUNITY): Payer: Self-pay | Admitting: *Deleted

## 2020-07-04 MED ORDER — HYDROXYZINE HCL 25 MG PO TABS
25.0000 mg | ORAL_TABLET | Freq: Three times a day (TID) | ORAL | 2 refills | Status: DC
Start: 2020-07-04 — End: 2020-07-04

## 2020-07-04 MED ORDER — HYDROXYZINE HCL 25 MG PO TABS
25.0000 mg | ORAL_TABLET | Freq: Three times a day (TID) | ORAL | 2 refills | Status: DC
Start: 2020-07-04 — End: 2021-01-24

## 2020-07-04 MED ORDER — METHOCARBAMOL 750 MG PO TABS: 750.0000 mg | ORAL_TABLET | Freq: Three times a day (TID) | ORAL | 2 refills | Status: DC | PRN

## 2020-07-04 MED ORDER — METHOCARBAMOL 750 MG PO TABS: 750.0000 mg | ORAL_TABLET | Freq: Three times a day (TID) | ORAL | 0 refills | Status: DC | PRN

## 2020-07-04 NOTE — Telephone Encounter (Signed)
Pt called requesting refill refills on the Vistaril and Robaxin, which you did not prescribe at last visit. Pt insists that you did but not seeing it in Epic or in current med list. Pt no showed in November and has no upcoming appointment on the books. Nurse offered to transfer pt to front desk for appointment but pt declined.

## 2020-07-04 NOTE — Telephone Encounter (Signed)
Pt called our office to refill medication. She asked if Dr. Artis Flock or someone could get her psychiatrist to call her, as she has not been able to get in contact with him.

## 2020-07-04 NOTE — Telephone Encounter (Signed)
Done

## 2020-07-04 NOTE — Telephone Encounter (Signed)
..   LAST APPOINTMENT DATE: 05/24/2020   NEXT APPOINTMENT DATE:@Visit  date not found  MEDICATION:methocarbamol (ROBAXIN) 750 MG  hydrOXYzine (ATARAX/VISTARIL) 25 MG tablet    PHARMACY:  **Let patient know to contact pharmacy at the end of the day to make sure medication is ready. **  ** Please notify patient to allow 48-72 hours to process**  **Encourage patient to contact the pharmacy for refills or they can request refills through [facility]**  CLINICAL FILLS OUT ALL BELOW:   LAST REFILL:  QTY:  REFILL DATE:    OTHER COMMENTS:    Okay for refill?  Please advise

## 2020-07-12 ENCOUNTER — Telehealth: Payer: 59 | Admitting: Family Medicine

## 2020-07-12 ENCOUNTER — Encounter: Payer: Self-pay | Admitting: Family Medicine

## 2020-07-12 NOTE — Progress Notes (Unsigned)
Pt was scheduled for video visit. Sent link and called patient at time of the appointment. LM on her voice mail advising her to log into the link up to 10 minutes passed the appt time or to call the office for assistance or to reschedule or cancel if needed. Waited in virtual space until 10 minutes after appointment.

## 2020-07-13 ENCOUNTER — Telehealth (INDEPENDENT_AMBULATORY_CARE_PROVIDER_SITE_OTHER): Payer: 59 | Admitting: Family Medicine

## 2020-07-13 ENCOUNTER — Encounter: Payer: Self-pay | Admitting: Family Medicine

## 2020-07-13 VITALS — Ht 66.0 in

## 2020-07-13 DIAGNOSIS — J029 Acute pharyngitis, unspecified: Secondary | ICD-10-CM

## 2020-07-13 NOTE — Progress Notes (Signed)
Patient no showed.  Natasha Mustard, MD [place] [facility]

## 2020-07-14 ENCOUNTER — Ambulatory Visit: Payer: 59 | Admitting: Family Medicine

## 2020-07-27 ENCOUNTER — Other Ambulatory Visit: Payer: Self-pay | Admitting: Family Medicine

## 2020-07-27 ENCOUNTER — Other Ambulatory Visit (HOSPITAL_COMMUNITY): Payer: Self-pay | Admitting: Psychiatry

## 2020-07-28 ENCOUNTER — Telehealth: Payer: Self-pay

## 2020-07-28 ENCOUNTER — Other Ambulatory Visit (HOSPITAL_COMMUNITY): Payer: Self-pay | Admitting: Psychiatry

## 2020-07-28 ENCOUNTER — Other Ambulatory Visit: Payer: Self-pay | Admitting: Family Medicine

## 2020-07-28 NOTE — Telephone Encounter (Signed)
Patient is calling in stating that [facility] does not have anything from [place] about the refill.

## 2020-07-28 NOTE — Telephone Encounter (Signed)
  LAST APPOINTMENT DATE: 07/14/2021   NEXT APPOINTMENT DATE:@Visit  date not found  MEDICATION: gabapentin (NEURONTIN) 100 MG capsule  PHARMACY:[id number] - [place],  - [address]  Comments: Patient is completely out and is asking for more refills.   Please advise

## 2020-07-28 NOTE — Telephone Encounter (Signed)
Pt needs to follow up with Psychiatrist for refills.

## 2020-07-28 NOTE — Telephone Encounter (Signed)
She gets these medication from her psychiatrist.  Orland Mustard, MD [place] [facility]

## 2020-07-28 NOTE — Telephone Encounter (Signed)
Reason for Call Symptomatic / Request for Health Information Initial Comment Caller states she is trying to get a refill on her Duloxetine and Gabapentin. She doesn't feel well and shaking. Hasn't taken in 3-4 days. Translation No Disp. Time Lamount Cohen Time) Disposition Final User 07/27/2020 9:44:25 PM FINAL ATTEMPT MADE - no message left Billips, Jody 07/27/2020 9:45:14 PM Send to RN Final Attempt Queue Billips, Jody 07/27/2020 9:44:44 PM FINAL ATTEMPT MADE - no message left Yes Billips, Jody Comments User: Breck Coons Date/Time (Eastern Time): 07/27/2020 9:43:28 PM During assessment pt. had issues with her phone - unable to contact due to her air pods not working. User: Breck Coons Date/Time Lamount Cohen Time): 07/27/2020 9:44:09 PM Made 2nd attempt to contact patient

## 2020-07-29 ENCOUNTER — Other Ambulatory Visit (HOSPITAL_COMMUNITY): Payer: Self-pay | Admitting: Psychiatry

## 2020-08-31 ENCOUNTER — Telehealth: Payer: 59 | Admitting: Family Medicine

## 2020-08-31 DIAGNOSIS — Z0289 Encounter for other administrative examinations: Secondary | ICD-10-CM

## 2020-09-02 ENCOUNTER — Encounter: Payer: Self-pay | Admitting: Family Medicine

## 2020-09-03 ENCOUNTER — Telehealth (INDEPENDENT_AMBULATORY_CARE_PROVIDER_SITE_OTHER): Payer: 59 | Admitting: Family Medicine

## 2020-09-03 ENCOUNTER — Encounter: Payer: Self-pay | Admitting: Family Medicine

## 2020-09-03 DIAGNOSIS — R569 Unspecified convulsions: Secondary | ICD-10-CM

## 2020-09-03 DIAGNOSIS — F411 Generalized anxiety disorder: Secondary | ICD-10-CM | POA: Diagnosis not present

## 2020-09-03 DIAGNOSIS — K529 Noninfective gastroenteritis and colitis, unspecified: Secondary | ICD-10-CM | POA: Diagnosis not present

## 2020-09-03 MED ORDER — ONDANSETRON 4 MG PO TBDP: 4.0000 mg | ORAL_TABLET | Freq: Three times a day (TID) | ORAL | 0 refills | Status: DC | PRN

## 2020-09-03 MED ORDER — MIRTAZAPINE 15 MG PO TABS: 15.0000 mg | ORAL_TABLET | Freq: Every day | ORAL | 2 refills | Status: DC

## 2020-09-03 NOTE — Progress Notes (Addendum)
CC: shaking, N/V/D  Subjective Natasha Pope is a 25 y.o. female who presents with vomiting and diarrhea. Due to COVID-19 pandemic, we are interacting via telephone. I verified patient's ID using 2 identifiers. Patient agreed to proceed with visit via this method. Patient is at home, I am at office. Patient, her grandmother and I are present for visit.   -Symptoms began 5 d ago.  -Wednesday night she was shaking, stiff and confused. She did not bite her tongue or lose control of bowel/bladder function. It lasted several minutes and she was not responding for around 1 min. Took a few min to get back to normal, but she was sleeping. Hx of seizure-like activity in 2020, neg EEG. She does not follow w a neurologist. Has not slept much over past week.  HA, abd pain, N/V/D, myalgias, wheezing, sob, stuffy nose, had a fever of 102 F around Wednesday.  -She was quite anxious prior to the shaking episode.  -Patient denies bleeding, ST, runny nose, ear pain, itchy/water eyes, sinus pain -Treatment to date: ibuprofen -Sick contacts: none known  -She has not tested for covid.  -She is not vaccinated.    Past Medical History:  Diagnosis Date   Anxiety    Asthma    Depression    Pyelonephritis    Sepsis (HCC)     Exam No conversational dyspnea Age appropriate judgment and insight Nml affect and mood  Assessment and Plan  Gastroenteritis - Plan: ondansetron (ZOFRAN-ODT) 4 MG disintegrating tablet  Seizure-like activity (HCC) - Plan: Ambulatory referral to Neurology  GAD (generalized anxiety disorder) - Plan: mirtazapine (REMERON) 15 MG tablet  1. Will send in Zofran for prn nausea control. Push fluids w electrolytes. Get tested for covid. 2/3. Will refer to neuro. She is on Topamax and gabapentin already. Will hold on adding seizure prophylaxis. I will change her bupropion to mirtazapine as to not lower her seizure threshold. She has a hx of anxiety which could be contributing  to mental fog and seizure-like activity. Avoid driving until following up with Dr. Artis Flock. She will reach out to both her psychiatrist and her PCP Mon regarding the medication change.  Total time: 26 min The patient and grandmother voiced understanding and agreement to the plan.  Jilda Roche [place], DO 09/03/20  11:37 AM

## 2020-09-13 ENCOUNTER — Ambulatory Visit: Payer: 59 | Admitting: Neurology

## 2020-09-13 ENCOUNTER — Encounter: Payer: Self-pay | Admitting: Neurology

## 2020-09-29 ENCOUNTER — Other Ambulatory Visit: Payer: Self-pay | Admitting: Family Medicine

## 2020-10-03 ENCOUNTER — Ambulatory Visit: Payer: 59 | Admitting: Neurology

## 2020-10-03 ENCOUNTER — Other Ambulatory Visit: Payer: Self-pay

## 2020-10-03 ENCOUNTER — Encounter: Payer: Self-pay | Admitting: Neurology

## 2020-10-03 VITALS — BP 138/92 | HR 122 | Ht 66.0 in | Wt 171.0 lb

## 2020-10-03 DIAGNOSIS — R569 Unspecified convulsions: Secondary | ICD-10-CM | POA: Insufficient documentation

## 2020-10-03 DIAGNOSIS — F32A Depression, unspecified: Secondary | ICD-10-CM | POA: Insufficient documentation

## 2020-10-03 DIAGNOSIS — Z789 Other specified health status: Secondary | ICD-10-CM | POA: Insufficient documentation

## 2020-10-03 DIAGNOSIS — Z7289 Other problems related to lifestyle: Secondary | ICD-10-CM | POA: Insufficient documentation

## 2020-10-03 MED ORDER — LAMOTRIGINE 100 MG PO TABS: 100.0000 mg | ORAL_TABLET | Freq: Two times a day (BID) | ORAL | 11 refills | Status: DC

## 2020-10-03 MED ORDER — LAMOTRIGINE 25 MG PO TABS: ORAL_TABLET | ORAL | 0 refills | Status: DC

## 2020-10-03 NOTE — Progress Notes (Signed)
Chief Complaint  Patient presents with  . New Patient (Initial Visit)    She is here with her grandmother, Natasha Pope. Reports an episode shaking, stiffness and confusion on 08/31/20. This lasted several minutes. Witnessed by her grandmother. She did not bite her tongue or lose control of bowel/bladder function. States  two similar events occurred in 2020 and three similar events last week. She has previously had a normal EEG. She has never been on any medications. She has also been having frequent, severe frontal headaches.       ASSESSMENT AND PLAN  Natasha Pope is a 25 y.o. female    Epilepsy  Not necessarily associated with alcohol withdrawal  Complete evaluation with MRI of the brain with without contrast  EEG  Laboratory evaluations  Lamotrigine titrating to 100 mg twice a day  No driving until seizure-free for 6 months   DIAGNOSTIC DATA (LABS, IMAGING, TESTING) - I reviewed patient records, labs, notes, testing and imaging myself where available.  I personally reviewed CT head without contrast on December 26, 2018 that was normal  HISTORICAL  Natasha Pope is a 25 year old female, seen in request by her primary care physician Natasha Pope, for evaluation of seizure, she is accompanied by her grandmother at today's visit on October 03, 2020  I reviewed and summarized the referring note.PMHX. Depression, anxiety, Cymbalta since May 2020, Gabapentin 100mg  daily,  Panic attack, at least twice a day, taking hydroxyzine prn, Insomnia, trazodone 100mg  2 tabs qhs.  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 [place], but she quit since November 2021, she lives alone, grandmother is visiting from [place]  She was born full-term, developmentally normal, has been doing well well until around 2018, she began to drink alcohol,  gradually increased alcohol use, reported 2- three 8 ounce of hard liquor daily, also smokes marijuana  First seizure was in May 2020, again in June 2020, leading to hospital admission, current seizure on August 31, 2020, February 10, March 9, for 2 episode, most recent one was on September 29, 2020, grandmother was able to videotape, it was clearly a generalized tonic-clonic seizure, patient was sitting in sofa, foaming coming out of her mouth, body tonic-clonic movement, lasting a few minutes, postevent confusion.  Patient has been drinking in those days when she was having a seizure  She denied lateralized motor or sensory deficit, no family history of seizure   PHYSICAL EXAM   Vitals:   10/03/20 1457  BP: (!) 138/92  Pulse: (!) 122  Weight: 171 lb (77.6 kg)  Height: 5\' 6"  (1.676 m)   Not recorded     Body mass index is 27.6 kg/m.  PHYSICAL EXAMNIATION:  Gen: NAD, conversant, well nourised, well groomed                     Cardiovascular: Regular rate rhythm, no peripheral edema, warm, nontender. Eyes: Conjunctivae clear without exudates or hemorrhage Neck: Supple, no carotid bruits. Pulmonary: Clear to auscultation bilaterally   NEUROLOGICAL EXAM:  MENTAL STATUS: Speech:    Speech is normal; fluent and spontaneous with normal comprehension.  Cognition:     Orientation to time, place and person     Normal recent and remote memory     Normal Attention span and concentration     Normal Language, naming, repeating,spontaneous  speech     Fund of knowledge   CRANIAL NERVES: CN II: Visual fields are full to confrontation. Pupils are round equal and briskly reactive to light. CN III, IV, VI: extraocular movement are normal. No ptosis. CN V: Facial sensation is intact to light touch CN VII: Face is symmetric with normal eye closure  CN VIII: Hearing is normal to causal conversation. CN IX, X: Phonation is normal. CN XI: Head turning and shoulder shrug are  intact  MOTOR: There is no pronator drift of out-stretched arms. Muscle bulk and tone are normal. Muscle strength is normal.  REFLEXES: Reflexes are 2+ and symmetric at the biceps, triceps, knees, and ankles. Plantar responses are flexor.  SENSORY: Intact to light touch, pinprick and vibratory sensation are intact in fingers and toes.  COORDINATION: There is no trunk or limb dysmetria noted.  GAIT/STANCE: Posture is normal. Gait is steady with normal steps, base, arm swing, and turning. Heel and toe walking are normal. Tandem gait is normal.  Romberg is absent.  REVIEW OF SYSTEMS: Full 14 system review of systems performed and notable only for above All other review of systems were negative.  ALLERGIES: No Known Allergies  HOME MEDICATIONS: Current Outpatient Medications  Medication Sig Dispense Refill  . DULoxetine (CYMBALTA) 60 MG capsule TAKE ONE CAPSULE BY MOUTH ONE TIME DAILY 90 capsule 0  . fluticasone (FLONASE) 50 MCG/ACT nasal spray Place 2 sprays into both nostrils daily. (Patient taking differently: Place 2 sprays into both nostrils daily as needed for allergies.) 16 g 6  . gabapentin (NEURONTIN) 100 MG capsule TAKE ONE CAPSULE BY MOUTH TWICE DAILY 60 capsule 0  . hydrOXYzine (ATARAX/VISTARIL) 25 MG tablet Take 1 tablet (25 mg total) by mouth 3 (three) times daily. 90 tablet 2  . ibuprofen (ADVIL) 200 MG tablet Take 1-2 tablets (200-400 mg total) by mouth 3 (three) times daily as needed for headache or moderate pain. 30 tablet 0  . mirtazapine (REMERON) 15 MG tablet Take 1 tablet (15 mg total) by mouth at bedtime. 30 tablet 2  . naltrexone (DEPADE) 50 MG tablet TAKE ONE TABLET BY MOUTH ONE TIME DAILY 30 tablet 0  . norethindrone-ethinyl estradiol (LOESTRIN FE) 1-20 MG-MCG tablet Take 1 tablet by mouth daily. 84 tablet 1  . ondansetron (ZOFRAN-ODT) 4 MG disintegrating tablet Take 1 tablet (4 mg total) by mouth every 8 (eight) hours as needed for nausea or vomiting. 20  tablet 0  . traZODone (DESYREL) 100 MG tablet Take 2 tablets (200 mg total) by mouth at bedtime as needed for sleep. 60 tablet 3  . triamcinolone cream (KENALOG) 0.1 % Apply to affected area 1-2 times daily 30 g 0   No current facility-administered medications for this visit.    PAST MEDICAL HISTORY: Past Medical History:  Diagnosis Date  . Anxiety   . Asthma   . Depression   . Pyelonephritis   . Seizure-like activity (HCC)   . Sepsis (HCC)     PAST SURGICAL HISTORY: Past Surgical History:  Procedure Laterality Date  . dental procedure      FAMILY HISTORY: Family History  Problem Relation Age of Onset  . Depression Mother   . Miscarriages / [place] Mother   . Hypertension Father   . Alcohol abuse Father   . Learning disabilities Sister   . Hyperlipidemia Maternal Grandmother   . Alcohol abuse Maternal Grandfather   . Alcohol abuse Paternal Grandmother   . Hypertension Paternal Grandfather     SOCIAL HISTORY:  Social History   Socioeconomic History  . Marital status: Single    Spouse name: Not on file  . Number of children: 0  . Years of education: some college  . Highest education level: Not on file  Occupational History  . Occupation: unemployed  Tobacco Use  . Smoking status: Former Smoker    Packs/day: 0.25    Types: Cigarettes    Quit date: 05/10/2018    Years since quitting: 2.4  . Smokeless tobacco: Never Used  Vaping Use  . Vaping Use: Never used  Substance and Sexual Activity  . Alcohol use: Yes    Comment: 10/03/20: 16-24 ounces of liquor per day  . Drug use: Not Currently    Types: Marijuana    Comment: 10/03/20: No longer using. Last use was 4 months ago.  Marland Kitchen Sexual activity: Not on file  Other Topics Concern  . Not on file  Social History Narrative   Lives alone but her grandmother checks on her frequently.   Left-handed.   No daily use of caffeine.   Social Determinants of Health   Financial Resource Strain: Not on file  Food  Insecurity: Not on file  Transportation Needs: Not on file  Physical Activity: Not on file  Stress: Not on file  Social Connections: Not on file  Intimate Partner Violence: Not on file      Levert Feinstein, M.D. Ph.D.  [facility] [address] [place], [place] [address] Ph: [phone] Fax: [phone]  CC:  Natasha Dory, DO [address] [place],  [place] [address]  Orland Mustard, MD

## 2020-10-04 ENCOUNTER — Telehealth: Payer: Self-pay | Admitting: Neurology

## 2020-10-04 NOTE — Telephone Encounter (Signed)
[employer] order sent to [facility]. They will obtain the auth and reach out to the patient to schedule.  

## 2020-10-05 ENCOUNTER — Ambulatory Visit: Payer: 59 | Admitting: Neurology

## 2020-10-05 ENCOUNTER — Other Ambulatory Visit: Payer: Self-pay

## 2020-10-05 DIAGNOSIS — F32A Depression, unspecified: Secondary | ICD-10-CM

## 2020-10-05 DIAGNOSIS — Z7289 Other problems related to lifestyle: Secondary | ICD-10-CM

## 2020-10-05 DIAGNOSIS — R569 Unspecified convulsions: Secondary | ICD-10-CM

## 2020-10-05 DIAGNOSIS — Z789 Other specified health status: Secondary | ICD-10-CM

## 2020-10-06 LAB — CBC WITH DIFFERENTIAL/PLATELET
Basophils Absolute: 0.1 10*3/uL (ref 0.0–0.2)
Basos: 1 %
EOS (ABSOLUTE): 0.1 10*3/uL (ref 0.0–0.4)
Eos: 2 %
Hematocrit: 36.6 % (ref 34.0–46.6)
Hemoglobin: 12.3 g/dL (ref 11.1–15.9)
Immature Grans (Abs): 0 10*3/uL (ref 0.0–0.1)
Immature Granulocytes: 1 %
Lymphocytes Absolute: 2.6 10*3/uL (ref 0.7–3.1)
Lymphs: 51 %
MCH: 32.4 pg (ref 26.6–33.0)
MCHC: 33.6 g/dL (ref 31.5–35.7)
MCV: 96 fL (ref 79–97)
Monocytes Absolute: 0.6 10*3/uL (ref 0.1–0.9)
Monocytes: 12 %
Neutrophils Absolute: 1.7 10*3/uL (ref 1.4–7.0)
Neutrophils: 33 %
Platelets: 247 10*3/uL (ref 150–450)
RBC: 3.8 x10E6/uL (ref 3.77–5.28)
RDW: 13 % (ref 11.7–15.4)
WBC: 5.1 10*3/uL (ref 3.4–10.8)

## 2020-10-06 LAB — COMPREHENSIVE METABOLIC PANEL
ALT: 18 IU/L (ref 0–32)
AST: 24 IU/L (ref 0–40)
Albumin/Globulin Ratio: 1.8 (ref 1.2–2.2)
Albumin: 4.6 g/dL (ref 3.9–5.0)
Alkaline Phosphatase: 92 IU/L (ref 44–121)
BUN/Creatinine Ratio: 11 (ref 9–23)
BUN: 9 mg/dL (ref 6–20)
Bilirubin Total: <0.2 mg/dL (ref 0.0–1.2)
CO2: 21 mmol/L (ref 20–29)
Calcium: 9.3 mg/dL (ref 8.7–10.2)
Chloride: 105 mmol/L (ref 96–106)
Creatinine, Ser: 0.83 mg/dL (ref 0.57–1.00)
Globulin, Total: 2.5 g/dL (ref 1.5–4.5)
Glucose: 85 mg/dL (ref 65–99)
Potassium: 4.1 mmol/L (ref 3.5–5.2)
Sodium: 144 mmol/L (ref 134–144)
Total Protein: 7.1 g/dL (ref 6.0–8.5)
eGFR: 102 mL/min/{1.73_m2} (ref >59)

## 2020-10-06 LAB — DRUG SCREEN 10 W/CONF, SERUM
Amphetamines, IA: NEGATIVE ng/mL
Barbiturates, IA: NEGATIVE ug/mL
Benzodiazepines, IA: NEGATIVE ng/mL
Cocaine & Metabolite, IA: NEGATIVE ng/mL
Methadone, IA: NEGATIVE ng/mL
Opiates, IA: NEGATIVE ng/mL
Oxycodones, IA: NEGATIVE ng/mL
Phencyclidine, IA: NEGATIVE ng/mL
Propoxyphene, IA: NEGATIVE ng/mL
THC(Marijuana) Metabolite, IA: NEGATIVE ng/mL

## 2020-10-06 LAB — HIV ANTIBODY (ROUTINE TESTING W REFLEX): HIV Screen 4th Generation wRfx: NONREACTIVE

## 2020-10-06 LAB — ETHANOL: Ethanol: 0.18 %

## 2020-10-06 LAB — RPR: RPR Ser Ql: NONREACTIVE

## 2020-10-06 LAB — TSH: TSH: 0.986 u[IU]/mL (ref 0.450–4.500)

## 2020-10-07 ENCOUNTER — Telehealth: Payer: Self-pay | Admitting: Neurology

## 2020-10-07 NOTE — Procedures (Signed)
   HISTORY: 25 year old female, with history of heavy alcohol use, presented with seizure  TECHNIQUE:  This is a routine 16 channel EEG recording with one channel devoted to a limited EKG recording.  It was performed during wakefulness, drowsiness and asleep.  Hyperventilation and photic stimulation were performed as activating procedures.  There are minimum muscle and movement artifact noted.  Upon maximum arousal, posterior dominant waking rhythm consistent of mildly dysrhythmic theta range activity. Activities are symmetric over the bilateral posterior derivations and attenuated with eye opening.  Hyperventilation produced mild/moderate buildup with higher amplitude and the slower activities noted.  Photic stimulation did not alter the tracing.  During EEG recording, patient developed drowsiness and no deeper stage of sleep was achieved During EEG recording, there was no epileptiform discharge noted.  EKG demonstrate regular rapid cardiac rhythm, heart rate 120 bpm  CONCLUSION: This is an abnormal EEG.  There is evidence of mild background slowing, consistent with bihemispheric malfunction, no evidence of epileptiform discharge Levert Feinstein, M.D. Ph.D.  [facility] [address] [place], [place] [address] Phone: [phone] Fax:      [phone]

## 2020-10-07 NOTE — Telephone Encounter (Signed)
I tried to call patient, but failed to reach patient about her EEG report, could not leave a message, please call again  EEG showed mild background slowing, no evidence of epileptiform discharge, this could be related to her polypharmacy treatment, moderate to severe daily alcohol use  Please emphasized importance of slowly tapering daily alcohol use, also check to see if she is tolerating lamotrigine  Concern of increased heart rate, she should have self document vital signs including heart rate daily, if her heart rate consistently above 100 bpm, she should contact her primary care  CONCLUSION: This is an abnormal EEG.  There is evidence of mild background slowing, consistent with bihemispheric malfunction, no evidence of epileptiform discharge Levert Feinstein, M.D. Ph.D.

## 2020-10-10 ENCOUNTER — Other Ambulatory Visit: Payer: Self-pay

## 2020-10-10 ENCOUNTER — Ambulatory Visit
Admission: RE | Admit: 2020-10-10 | Discharge: 2020-10-10 | Disposition: A | Discharge status: Home / Self Care | Payer: 59 | Source: Ambulatory Visit | Attending: Neurology | Admitting: Neurology

## 2020-10-10 DIAGNOSIS — Z7289 Other problems related to lifestyle: Secondary | ICD-10-CM

## 2020-10-10 DIAGNOSIS — Z789 Other specified health status: Secondary | ICD-10-CM

## 2020-10-10 DIAGNOSIS — R569 Unspecified convulsions: Secondary | ICD-10-CM

## 2020-10-10 DIAGNOSIS — F32A Depression, unspecified: Secondary | ICD-10-CM

## 2020-10-10 MED ORDER — GADOBENATE DIMEGLUMINE 529 MG/ML IV SOLN
15.0000 mL | Freq: Once | INTRAVENOUS | Status: AC | PRN
  Administered 2020-10-10: 15 mL via INTRAVENOUS

## 2020-10-10 NOTE — Telephone Encounter (Signed)
I spoke to the patient. She verbalized understanding of the EEG results. She is doing well on the lamotrigine. No adverse side effects to this point. She is agreeable to taper off daily alcohol use. She plans to call [facility] today to schedule her ordered MRI.

## 2020-10-13 ENCOUNTER — Telehealth: Payer: Self-pay | Admitting: Neurology

## 2020-10-13 NOTE — Telephone Encounter (Signed)
Pt's grandmother(on [place]) is asking for a call to discuss concern about pt not having any short term memory

## 2020-10-13 NOTE — Telephone Encounter (Addendum)
I called her grandmother back. She is still noticing the patient having some forgetfulness and short-term memory loss.  EEG CONCLUSION: This isan abnormalEEG. There is evidence of mild background slowing, consistent with bihemispheric malfunction, no evidence of epileptiform discharge.      MRI BRAIN IMPRESSION: This is a normal MRI  of the he brain with and without contrast.  The patient estimated drinking 16-24 ounces of liquor every day when she was at her appt. Her grandmother says she is able to drink an "enormous amount of alcohol" and she has continued daily use, despite being advised to slowly taper off. The patient has declined rehab treatment multiple times. States she has been hospitalized due to this addiction. Arrested several times for disorderly conduct. Previous history of using "pills - Adderall, Xanax" and marijuana.   Her grandmother understands that this overuse of alcohol is a huge contributing factor to her memory and forgetfulness, along with her depression. She is seeking out all options to get her into a treatment program. She appreciated the call back.

## 2020-10-25 ENCOUNTER — Telehealth: Payer: Self-pay | Admitting: Neurology

## 2020-10-25 NOTE — Telephone Encounter (Signed)
I have attempted to call the patient back three times. No answer and voicemail is full. I was able to reach her grandmother, [name] (on [place]). Pt is titrating on lamotrigine and is currently on 75mg  BID. Her grandmother has noticed increased irritation, aggressive behavior and excessive sleepiness. She also reports the patient is still consuming large amounts of liquor each day (estimates in excess of 24 ounces). I have communicated the importance of being compliant with medication and slowly withdrawing from alcohol consumption. She will encourage her on these matters. She understands that she should not abruptly stop either (slowly titrating meds - slowly tapering off alcohol). She will encourage the patient to follow medical advice. Her grandmother is seeking options for treatment programs.

## 2020-10-25 NOTE — Telephone Encounter (Signed)
Pt called, lamoTRIgine (LAMICTAL) 100 MG tablet does not make me feel very well. Have worked up to 3 tablets. This medication is making me very sleep, cannot stay awake, irritable, aggression. Would like a call from the nurse.

## 2020-10-26 ENCOUNTER — Ambulatory Visit: Payer: 59 | Admitting: Neurology

## 2020-10-31 ENCOUNTER — Other Ambulatory Visit: Payer: Self-pay | Admitting: Family Medicine

## 2020-11-09 ENCOUNTER — Other Ambulatory Visit: Payer: Self-pay | Admitting: Family Medicine

## 2020-12-07 ENCOUNTER — Other Ambulatory Visit: Payer: Self-pay | Admitting: Family Medicine

## 2020-12-07 DIAGNOSIS — F411 Generalized anxiety disorder: Secondary | ICD-10-CM

## 2020-12-14 ENCOUNTER — Other Ambulatory Visit: Payer: Self-pay

## 2020-12-14 ENCOUNTER — Encounter: Payer: Self-pay | Admitting: Registered Nurse

## 2020-12-14 ENCOUNTER — Telehealth (INDEPENDENT_AMBULATORY_CARE_PROVIDER_SITE_OTHER): Payer: 59 | Admitting: Registered Nurse

## 2020-12-14 ENCOUNTER — Encounter (HOSPITAL_BASED_OUTPATIENT_CLINIC_OR_DEPARTMENT_OTHER): Payer: Self-pay

## 2020-12-14 ENCOUNTER — Emergency Department (HOSPITAL_BASED_OUTPATIENT_CLINIC_OR_DEPARTMENT_OTHER)
Admission: EM | Admit: 2020-12-14 | Discharge: 2020-12-14 | Disposition: A | Discharge status: Home / Self Care | Payer: 59 | Attending: Emergency Medicine | Admitting: Emergency Medicine

## 2020-12-14 VITALS — Ht 66.0 in | Wt 150.0 lb

## 2020-12-14 DIAGNOSIS — Z87891 Personal history of nicotine dependence: Secondary | ICD-10-CM | POA: Diagnosis not present

## 2020-12-14 DIAGNOSIS — E876 Hypokalemia: Secondary | ICD-10-CM | POA: Insufficient documentation

## 2020-12-14 DIAGNOSIS — F10230 Alcohol dependence with withdrawal, uncomplicated: Secondary | ICD-10-CM | POA: Diagnosis not present

## 2020-12-14 DIAGNOSIS — R21 Rash and other nonspecific skin eruption: Secondary | ICD-10-CM | POA: Insufficient documentation

## 2020-12-14 DIAGNOSIS — R569 Unspecified convulsions: Secondary | ICD-10-CM

## 2020-12-14 DIAGNOSIS — J45909 Unspecified asthma, uncomplicated: Secondary | ICD-10-CM | POA: Diagnosis not present

## 2020-12-14 DIAGNOSIS — R000 Tachycardia, unspecified: Secondary | ICD-10-CM | POA: Insufficient documentation

## 2020-12-14 DIAGNOSIS — F10930 Alcohol use, unspecified with withdrawal, uncomplicated: Secondary | ICD-10-CM

## 2020-12-14 DIAGNOSIS — F10239 Alcohol dependence with withdrawal, unspecified: Secondary | ICD-10-CM | POA: Insufficient documentation

## 2020-12-14 LAB — CBC WITH DIFFERENTIAL/PLATELET
Abs Immature Granulocytes: 0.02 10*3/uL (ref 0.00–0.07)
Basophils Absolute: 0.1 10*3/uL (ref 0.0–0.1)
Basophils Relative: 1 %
Eosinophils Absolute: 0 10*3/uL (ref 0.0–0.5)
Eosinophils Relative: 0 %
HCT: 43.6 % (ref 36.0–46.0)
Hemoglobin: 15 g/dL (ref 12.0–15.0)
Immature Granulocytes: 0 %
Lymphocytes Relative: 14 %
Lymphs Abs: 1.3 10*3/uL (ref 0.7–4.0)
MCH: 33.1 pg (ref 26.0–34.0)
MCHC: 34.4 g/dL (ref 30.0–36.0)
MCV: 96.2 fL (ref 80.0–100.0)
Monocytes Absolute: 0.5 10*3/uL (ref 0.1–1.0)
Monocytes Relative: 6 %
Neutro Abs: 7.2 10*3/uL (ref 1.7–7.7)
Neutrophils Relative %: 79 %
Platelets: 305 10*3/uL (ref 150–400)
RBC: 4.53 MIL/uL (ref 3.87–5.11)
RDW: 13.5 % (ref 11.5–15.5)
WBC: 9.1 10*3/uL (ref 4.0–10.5)
nRBC: 0.3 % — ABNORMAL HIGH (ref 0.0–0.2)

## 2020-12-14 LAB — RAPID URINE DRUG SCREEN, HOSP PERFORMED
Amphetamines: NOT DETECTED
Barbiturates: NOT DETECTED
Benzodiazepines: NOT DETECTED
Cocaine: NOT DETECTED
Opiates: NOT DETECTED
Tetrahydrocannabinol: NOT DETECTED

## 2020-12-14 LAB — URINALYSIS, ROUTINE W REFLEX MICROSCOPIC
Glucose, UA: NEGATIVE mg/dL
Hgb urine dipstick: NEGATIVE
Ketones, ur: 15 mg/dL — AB
Leukocytes,Ua: NEGATIVE
Nitrite: NEGATIVE
Protein, ur: 30 mg/dL — AB
Specific Gravity, Urine: >1.03 — ABNORMAL HIGH (ref 1.005–1.030)
pH: 6 (ref 5.0–8.0)

## 2020-12-14 LAB — URINALYSIS, MICROSCOPIC (REFLEX)

## 2020-12-14 LAB — COMPREHENSIVE METABOLIC PANEL
ALT: 17 U/L (ref 0–44)
AST: 40 U/L (ref 15–41)
Albumin: 5 g/dL (ref 3.5–5.0)
Alkaline Phosphatase: 82 U/L (ref 38–126)
Anion gap: 15 (ref 5–15)
BUN: 6 mg/dL (ref 6–20)
CO2: 26 mmol/L (ref 22–32)
Calcium: 9.3 mg/dL (ref 8.9–10.3)
Chloride: 94 mmol/L — ABNORMAL LOW (ref 98–111)
Creatinine, Ser: 0.82 mg/dL (ref 0.44–1.00)
GFR, Estimated: >60 mL/min (ref >60)
Glucose, Bld: 88 mg/dL (ref 70–99)
Potassium: 2.9 mmol/L — ABNORMAL LOW (ref 3.5–5.1)
Sodium: 135 mmol/L (ref 135–145)
Total Bilirubin: 1 mg/dL (ref 0.3–1.2)
Total Protein: 8.5 g/dL — ABNORMAL HIGH (ref 6.5–8.1)

## 2020-12-14 LAB — MAGNESIUM: Magnesium: 1.9 mg/dL (ref 1.7–2.4)

## 2020-12-14 LAB — LIPASE, BLOOD: Lipase: 14 U/L (ref 11–51)

## 2020-12-14 LAB — ETHANOL: Alcohol, Ethyl (B): <10 mg/dL (ref <10)

## 2020-12-14 LAB — PREGNANCY, URINE: Preg Test, Ur: NEGATIVE

## 2020-12-14 LAB — CBG MONITORING, ED: Glucose-Capillary: 140 mg/dL — ABNORMAL HIGH (ref 70–99)

## 2020-12-14 MED ORDER — POTASSIUM CHLORIDE 10 MEQ/100ML IV SOLN
10.0000 meq | Freq: Once | INTRAVENOUS | Status: AC
  Administered 2020-12-14: 10 meq via INTRAVENOUS
  Filled 2020-12-14: qty 100

## 2020-12-14 MED ORDER — SODIUM CHLORIDE 0.9 % IV BOLUS
1000.0000 mL | Freq: Once | INTRAVENOUS | Status: AC
  Administered 2020-12-14: 1000 mL via INTRAVENOUS

## 2020-12-14 MED ORDER — FOLIC ACID 1 MG PO TABS
1.0000 mg | ORAL_TABLET | Freq: Once | ORAL | Status: AC
  Administered 2020-12-14: 1 mg via ORAL
  Filled 2020-12-14: qty 1

## 2020-12-14 MED ORDER — CHLORDIAZEPOXIDE HCL 25 MG PO CAPS
100.0000 mg | ORAL_CAPSULE | Freq: Once | ORAL | Status: AC
  Administered 2020-12-14: 100 mg via ORAL
  Filled 2020-12-14: qty 4

## 2020-12-14 MED ORDER — LORAZEPAM 1 MG PO TABS
1.0000 mg | ORAL_TABLET | ORAL | Status: DC | PRN
Start: 2020-12-14 — End: 2020-12-14

## 2020-12-14 MED ORDER — POTASSIUM CHLORIDE ER 10 MEQ PO TBCR: 10.0000 meq | EXTENDED_RELEASE_TABLET | Freq: Every day | ORAL | 0 refills | Status: DC

## 2020-12-14 MED ORDER — LORAZEPAM 2 MG/ML IJ SOLN
1.0000 mg | Freq: Once | INTRAMUSCULAR | Status: AC
  Administered 2020-12-14: 1 mg via INTRAVENOUS
  Filled 2020-12-14: qty 1

## 2020-12-14 MED ORDER — CHLORDIAZEPOXIDE HCL 25 MG PO CAPS: ORAL_CAPSULE | ORAL | 0 refills | Status: DC

## 2020-12-14 MED ORDER — LORAZEPAM 2 MG/ML IJ SOLN
1.0000 mg | INTRAMUSCULAR | Status: DC | PRN
Start: 2020-12-14 — End: 2020-12-14

## 2020-12-14 MED ORDER — ADULT MULTIVITAMIN W/MINERALS CH
1.0000 | ORAL_TABLET | Freq: Every day | ORAL | Status: DC
  Administered 2020-12-14: 1 via ORAL
  Filled 2020-12-14: qty 1

## 2020-12-14 NOTE — Progress Notes (Signed)
Telemedicine Encounter- SOAP NOTE Established Patient  This telephone encounter was conducted with the patient's (or proxy's) verbal consent via audio telecommunications: yes  Patient was instructed to have this encounter in a suitably private space; and to only have persons present to whom they give permission to participate. In addition, patient identity was confirmed by use of name plus two identifiers (DOB and address).  I discussed the limitations, risks, security and privacy concerns of performing an evaluation and management service by telephone and the availability of in person appointments. I also discussed with the patient that there may be a patient responsible charge related to this service. The patient expressed understanding and agreed to proceed.  I spent a total of 24 minutes talking with the patient or their proxy.  Patient at home Provider in office  Participants: [name], NP, [name], and pt guardian  Chief Complaint  Patient presents with  . Rash    Pt c/o rash on neck started spreading to face and chest . Started on Sunday. Pt is have itching but now gone, rash is red and splotchy. She has not tried any medications.    Subjective   Natasha Pope [place] is a 25 y.o. established patient. Telephone visit today for rash  HPI Unfortunately soon after initiating call pt began seizing.  Guardian was able to monitor patient for breathing, airway, and safety.  Seizure activity reportedly lasted 2-3 minutes and involved less spasticity than typical seizures.  Seizure history is almost entirely around alcohol withdrawal. Has been treated by Dr. Terrace Arabia in Neuro and her PCP Dr. Artis Flock.   Reportedly consumes 16oz of liquor each day, unsure of recent habits. Has not had etoh in around 16 hours or so.  No new meds or exposures.   Patient Active Problem List   Diagnosis Date Noted  . Seizures (HCC) 10/03/2020  . Depression 10/03/2020  . Alcohol use  10/03/2020  . 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 Delusional disorder (HCC)   . Cocaine use disorder, moderate, in early remission (HCC) 08/13/2019  . 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/2019  . 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 Alcohol withdrawal (HCC) 01/10/2019  . Alcohol use disorder, severe, in early remission, dependence (HCC) 12/27/2018  . Recurrent major depressive disorder (HCC) 12/27/2018  . Seizure (HCC) 12/26/2018  . Alcohol use with alcohol-induced mood disorder (HCC)   . Substance induced mood disorder (HCC) 12/09/2018  . Alcohol abuse 12/09/2018  . Macrocytic anemia 05/19/2018  . Thrombocytopenia (HCC) 05/19/2018  . ASCUS of cervix with negative high risk HPV 03/07/2018  . Tobacco abuse 01/31/2018  . Asthma 01/31/2018  . GAD (generalized anxiety disorder) 01/31/2018    Past Medical History:  Diagnosis Date  . Anxiety   . Asthma   . Depression   . Pyelonephritis   . Seizure-like activity (HCC)   . Sepsis (HCC)     Current Outpatient Medications  Medication Sig Dispense Refill  . DULoxetine (CYMBALTA) 60 MG capsule TAKE ONE CAPSULE BY MOUTH ONE TIME DAILY 90 capsule 0  . fluticasone (FLONASE) 50 MCG/ACT nasal spray Place 2 sprays into both nostrils daily. (Patient taking differently: Place 2 sprays into both nostrils daily as needed for allergies.) 16 g 6  . gabapentin (NEURONTIN) 100 MG capsule TAKE ONE CAPSULE BY MOUTH TWICE DAILY 60 capsule 0  . HAILEY FE 1/20 1-20 MG-MCG tablet TAKE ONE TABLET BY MOUTH ONE TIME DAILY 84 tablet 0  .  hydrOXYzine (ATARAX/VISTARIL) 25 MG tablet Take 1 tablet (25 mg total) by mouth 3 (three) times daily. 90 tablet 2  . ibuprofen (ADVIL) 200 MG tablet Take 1-2 tablets (200-400 mg total) by mouth 3 (three) times daily as needed for headache or moderate pain. 30 tablet 0  . lamoTRIgine (LAMICTAL) 100 MG tablet Take 1 tablet (100 mg total) by mouth 2 (two) times daily.  60 tablet 11  . methocarbamol (ROBAXIN) 750 MG tablet TAKE ONE TABLET BY MOUTH EVERY EIGHT HOURS AS NEEDED FOR MUSCLE SPASM 30 tablet 0  . mirtazapine (REMERON) 15 MG tablet Take 1 tablet (15 mg total) by mouth at bedtime. 30 tablet 2  . naltrexone (DEPADE) 50 MG tablet TAKE ONE TABLET BY MOUTH ONE TIME DAILY 30 tablet 0  . ondansetron (ZOFRAN-ODT) 4 MG disintegrating tablet Take 1 tablet (4 mg total) by mouth every 8 (eight) hours as needed for nausea or vomiting. 20 tablet 0  . traZODone (DESYREL) 100 MG tablet Take 2 tablets (200 mg total) by mouth at bedtime as needed for sleep. 60 tablet 3  . triamcinolone cream (KENALOG) 0.1 % Apply to affected area 1-2 times daily (Patient not taking: Reported on 12/14/2020) 30 g 0   No current facility-administered medications for this visit.    No Known Allergies  Social History   Socioeconomic History  . Marital status: Single    Spouse name: Not on file  . Number of children: 0  . Years of education: some college  . Highest education level: Not on file  Occupational History  . Occupation: unemployed  Tobacco Use  . Smoking status: Former Smoker    Packs/day: 0.25    Types: Cigarettes    Quit date: 05/10/2018    Years since quitting: 2.6  . Smokeless tobacco: Never Used  Vaping Use  . Vaping Use: Never used  Substance and Sexual Activity  . Alcohol use: Yes    Comment: 10/03/20: 16-24 ounces of liquor per day  . Drug use: Not Currently    Types: Marijuana    Comment: 10/03/20: No longer using. Last use was 4 months ago.  Marland Kitchen Sexual activity: Not on file  Other Topics Concern  . Not on file  Social History Narrative   Lives alone but her grandmother checks on her frequently.   Left-handed.   No daily use of caffeine.   Social Determinants of Health   Financial Resource Strain: Not on file  Food Insecurity: Not on file  Transportation Needs: Not on file  Physical Activity: Not on file  Stress: Not on file  Social Connections:  Not on file  Intimate Partner Violence: Not on file    ROS Per hpi   Objective   Vitals as reported by the patient: Today's Vitals   12/14/20 1212  Weight: 150 lb (68 kg)  Height: 5\' 6"  (1.676 m)    was seen today for rash.  Diagnoses and all orders for this visit:  Alcohol withdrawal seizure without complication (HCC)   PLAN  Seems as seizure r/t alcohol withdrawal. Given competent guardian is with patient, ok to drive to ER ASAP. Otherwise, advised to call 911.  Will defer treatment of rash until after seizure is worked up  Patient encouraged to call clinic with any questions, comments, or concerns.   I discussed the assessment and treatment plan with the patient. The patient was provided an opportunity to ask questions and all were answered. The patient agreed with the plan and demonstrated  an understanding of the instructions.   The patient was advised to call back or seek an in-person evaluation if the symptoms worsen or if the condition fails to improve as anticipated.  I provided 24 minutes of non-face-to-face time during this encounter.  Janeece Agee, NP  Primary Care at [facility]

## 2020-12-14 NOTE — ED Triage Notes (Signed)
She states that while she was on a video Dr. Visit, the doctor observed her to have a tonic-clonic seizure. She tells me that she has had seizures before. She tells me that she is mainly concerned with a red, non-raised rash which is "spreading" x ~ 1 week. She is alert and oriented x 4 with clear speech. Her grandmother is with her.

## 2020-12-14 NOTE — Discharge Instructions (Signed)
Please do not drive a car, go swimming, operate heavy machinery.  You are high risk for seizures, which can be life-threatening.  I prescribed you medicine called Librium, which is a benzodiazepine.  Please pick this up from the pharmacy today.  You can take this as prescribed, as needed for the next 3 days for withdrawal symptoms.  If you are having continued severe withdrawal symptoms despite this medicine, you should return to drinking alcohol (in the smallest amounts needed to stop your symptoms).  Alcohol withdrawal can lead to life-threatening seizures.  You may need to be on a very slow taper.  Please make another appointment with your primary clinic to talk about this issue.  Your potassium level is also low today.  We gave you some potassium in the IV and prescribed you potassium to start taking at home.

## 2020-12-14 NOTE — ED Notes (Signed)
CBG 140 mg/dL results given to Electa Sniff RN

## 2020-12-14 NOTE — ED Provider Notes (Signed)
[facility]   CSN: [id number] Arrival date & time: 12/14/20  1403     History Chief Complaint  Patient presents with  . Seizures  . Rash    Natasha Pope is a 25 y.o. female with history of chronic alcohol consumption, generalized seizures, presenting emergency department with concern for seizure.  History is provided by the patient, her grandmother, as well as a health provider who referred her into the ED.  The patient is scheduled doctor's visit today for a splotchy rash that is developed over her chest and back.  During the course of her interview, the patient had a generalized witnessed seizure.  Her grandmother reports "she made the ground like she does before she has a seizure, and her eyes rolled back, and she was shaking all over.  I was able to help her to the floor.  It only lasted a few minutes but she was confused afterwards for a couple of minutes."  Her grandmother then drove the patient to the ED.  Patient reports he continues to drink alcohol.  She drinks "a few cups" of liquor on near daily basis.  Her last drink was about 2 days ago.  She woke up feeling nauseated this morning, shaky, feels like she is in withdrawal.  She was prescribed Lamictal and has been compliant with this for her seizures.  She has been having seizures for few months.  She seen neurology, had an outpatient MRI which was unremarkable in March.  She also had an EEG which showed no evident epileptiform activity.  It was felt that her seizures were likely related to alcohol withdrawal.  She was encouraged to slowly taper her alcohol use, but has not been able to do so.  She lives by herself.  She does not drive.  Her grandmother comes and visits periodically, and stayed with her for a few months after her last seizure.   HPI     Past Medical History:  Diagnosis Date  . Anxiety   . Asthma   . Depression   . Pyelonephritis   . Seizure-like  activity (HCC)   . Sepsis [facility](HCC)     Patient Active Problem List   Diagnosis Date Noted  . Seizures (HCC) 10/03/2020  . Depression 10/03/2020  . Alcohol use 10/03/2020  . 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 Delusional disorder (HCC)   . Cocaine use disorder, moderate, in early remission (HCC) 08/13/2019  . 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/2019  . 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 Alcohol withdrawal (HCC) 01/10/2019  . Alcohol use disorder, severe, in early remission, dependence (HCC) 12/27/2018  . Recurrent major depressive disorder (HCC) 12/27/2018  . Seizure (HCC) 12/26/2018  . Alcohol use with alcohol-induced mood disorder (HCC)   . Substance induced mood disorder (HCC) 12/09/2018  . Alcohol abuse 12/09/2018  . Macrocytic anemia 05/19/2018  . Thrombocytopenia (HCC) 05/19/2018  . ASCUS of cervix with negative high risk HPV 03/07/2018  . Tobacco abuse 01/31/2018  . Asthma 01/31/2018  . GAD (generalized anxiety disorder) 01/31/2018    Past Surgical History:  Procedure Laterality Date  . dental procedure       OB History   No obstetric history on file.     Family History  Problem Relation Age of Onset  . Depression Mother   . Miscarriages / [place] Mother   . Thyroid disease Mother   . Hypertension Father   .  Alcohol abuse Father   . Learning disabilities Sister   . Hyperlipidemia Maternal Grandmother   . Alcohol abuse Maternal Grandfather   . Alcohol abuse Paternal Grandmother   . Hypertension Paternal Grandfather     Social History   Tobacco Use  . Smoking status: Former Smoker    Packs/day: 0.25    Types: Cigarettes    Quit date: 05/10/2018    Years since quitting: 2.6  . Smokeless tobacco: Never Used  Vaping Use  . Vaping Use: Never used  Substance Use Topics  . Alcohol use: Yes    Comment: 10/03/20: 16-24 ounces of liquor per day  . Drug use: Not Currently    Types:  Marijuana    Comment: 10/03/20: No longer using. Last use was 4 months ago.    Home Medications Prior to Admission medications   Medication Sig Start Date End Date Taking? Authorizing Provider  chlordiazePOXIDE (LIBRIUM) 25 MG capsule 50mg  PO TID x 1D, then 25-50mg  PO BID X 1D, then 25-50mg  PO QD X 1D 12/14/20  Yes Everhett Bozard, 12/16/20, MD  potassium chloride (KLOR-CON) 10 MEQ tablet Take 1 tablet (10 mEq total) by mouth daily. 12/14/20 01/13/21 Yes Sundae Maners, 01/15/21, MD  DULoxetine (CYMBALTA) 60 MG capsule TAKE ONE CAPSULE BY MOUTH ONE TIME DAILY 07/28/20   Pucilowski, 09/25/20, MD  fluticasone (FLONASE) 50 MCG/ACT nasal spray Place 2 sprays into both nostrils daily. Patient taking differently: Place 2 sprays into both nostrils daily as needed for allergies. 07/30/19   09/27/19, MD  gabapentin (NEURONTIN) 100 MG capsule TAKE ONE CAPSULE BY MOUTH TWICE DAILY 12/07/20   12/09/20, MD  HAILEY FE 1/20 1-20 MG-MCG tablet TAKE ONE TABLET BY MOUTH ONE TIME DAILY 11/01/20   01/01/21, MD  hydrOXYzine (ATARAX/VISTARIL) 25 MG tablet Take 1 tablet (25 mg total) by mouth 3 (three) times daily. 07/04/20   07/06/20, MD  ibuprofen (ADVIL) 200 MG tablet Take 1-2 tablets (200-400 mg total) by mouth 3 (three) times daily as needed for headache or moderate pain. 01/16/19   01/18/19, MD  lamoTRIgine (LAMICTAL) 100 MG tablet Take 1 tablet (100 mg total) by mouth 2 (two) times daily. 10/03/20   10/05/20, MD  methocarbamol (ROBAXIN) 750 MG tablet TAKE ONE TABLET BY MOUTH EVERY EIGHT HOURS AS NEEDED FOR MUSCLE SPASM 11/10/20   11/12/20, MD  mirtazapine (REMERON) 15 MG tablet Take 1 tablet (15 mg total) by mouth at bedtime. 09/03/20   11/01/20, DO  naltrexone (DEPADE) 50 MG tablet TAKE ONE TABLET BY MOUTH ONE TIME DAILY 07/29/20   Pucilowski, 09/26/20, MD  ondansetron (ZOFRAN-ODT) 4 MG disintegrating tablet Take 1 tablet (4 mg total) by mouth every 8 (eight) hours as needed for  nausea or vomiting. 09/03/20   11/01/20, Carmelia Roller, DO  traZODone (DESYREL) 100 MG tablet Take 2 tablets (200 mg total) by mouth at bedtime as needed for sleep. 06/17/20 10/15/20  Pucilowski, 10/17/20, MD  triamcinolone cream (KENALOG) 0.1 % Apply to affected area 1-2 times daily Patient not taking: Reported on 12/14/2020 05/04/20   05/06/20, MD    Allergies    Patient has no known allergies.  Review of Systems   Review of Systems  Constitutional: Negative for chills and fever.  HENT: Negative for ear pain and sore throat.   Eyes: Negative for pain and visual disturbance.  Respiratory: Negative for cough and shortness of breath.   Cardiovascular: Negative for chest pain and  palpitations.  Gastrointestinal: Positive for nausea and vomiting. Negative for abdominal pain.  Genitourinary: Negative for dysuria and hematuria.  Musculoskeletal: Negative for arthralgias.  Skin: Negative for color change and rash.  Neurological: Positive for tremors and seizures. Negative for syncope and light-headedness.  All other systems reviewed and are negative.   Physical Exam Updated Vital Signs BP (!) 141/112 (BP Location: Right Arm)   Pulse (!) 107   Temp 98.4 F (36.9 C) (Oral)   Resp 18   Ht 5\' 6"  (1.676 m)   Wt 81.6 kg Comment: Weighed on Bed  LMP 12/10/2020   SpO2 98%   BMI 29.04 kg/m   Physical Exam Constitutional:      General: She is not in acute distress. HENT:     Head: Normocephalic and atraumatic.  Eyes:     Conjunctiva/sclera: Conjunctivae normal.     Pupils: Pupils are equal, round, and reactive to light.  Cardiovascular:     Rate and Rhythm: Regular rhythm. Tachycardia present.     Pulses: Normal pulses.     Comments: HR 102 Pulmonary:     Effort: Pulmonary effort is normal. No respiratory distress.     Breath sounds: Normal breath sounds.  Abdominal:     General: There is no distension.     Tenderness: There is no abdominal tenderness.  Skin:    General:  Skin is warm and dry.     Comments: Nonblanching macular rash in spotted pattern on chest, arms, upper legs, and lower face, sparing the palms No mucosal involvement No pustular lesions or vesicles Rash is non pruritic   Neurological:     General: No focal deficit present.     Mental Status: She is alert and oriented to person, place, and time. Mental status is at baseline.     Sensory: No sensory deficit.     Motor: No weakness.     Comments: Bilateral hand tremors  Psychiatric:        Mood and Affect: Mood normal.        Behavior: Behavior normal.     ED Results / Procedures / Treatments   Labs (all labs ordered are listed, but only abnormal results are displayed) Labs Reviewed  COMPREHENSIVE METABOLIC PANEL - Abnormal; Notable for the following components:      Result Value   Potassium 2.9 (*)    Chloride 94 (*)    Total Protein 8.5 (*)    All other components within normal limits  CBC WITH DIFFERENTIAL/PLATELET - Abnormal; Notable for the following components:   nRBC 0.3 (*)    All other components within normal limits  URINALYSIS, ROUTINE W REFLEX MICROSCOPIC - Abnormal; Notable for the following components:   Specific Gravity, Urine >1.030 (*)    Bilirubin Urine SMALL (*)    Ketones, ur 15 (*)    Protein, ur 30 (*)    All other components within normal limits  URINALYSIS, MICROSCOPIC (REFLEX) - Abnormal; Notable for the following components:   Bacteria, UA FEW (*)    All other components within normal limits  CBG MONITORING, ED - Abnormal; Notable for the following components:   Glucose-Capillary 140 (*)    All other components within normal limits  LIPASE, BLOOD  ETHANOL  RAPID URINE DRUG SCREEN, HOSP PERFORMED  PREGNANCY, URINE  MAGNESIUM    EKG None  Radiology No results found.  Procedures Procedures   Medications Ordered in ED Medications  LORazepam (ATIVAN) tablet 1-4 mg (has no administration in time  range)    Or  LORazepam (ATIVAN) injection  1-4 mg (has no administration in time range)  multivitamin with minerals tablet 1 tablet (1 tablet Oral Given 12/14/20 1504)  LORazepam (ATIVAN) injection 1 mg (1 mg Intravenous Given 12/14/20 1504)  folic acid (FOLVITE) tablet 1 mg (1 mg Oral Given 12/14/20 1504)  sodium chloride 0.9 % bolus 1,000 mL (0 mLs Intravenous Stopped 12/14/20 1713)  potassium chloride 10 mEq in 100 mL IVPB (0 mEq Intravenous Stopped 12/14/20 1714)  chlordiazePOXIDE (LIBRIUM) capsule 100 mg (100 mg Oral Given 12/14/20 1656)    ED Course  I have reviewed the triage vital signs and the nursing notes.  Pertinent labs & imaging results that were available during my care of the patient were reviewed by me and considered in my medical decision making (see chart for details).  Problem list  1. Seizure - in the setting of alcohol withdrawal symptoms, last drink 2 days ago.  etoh negative today.  Likely this is a withdrawal seizure.  IV ativan given on arrival for tremors.   - neurology outpatient notes reviewed including EEG and MRI from this year - she reports compliance with lamictal and overall improvement of frequency of seizures on this medication  - I discussed with her and her grandmother the process of withdrawal seizures, which we can stabilize with benzos.  Overall her current withdrawal symptoms are quite mild, and I think this can be managed as an outpatient.  However, I explained if her withdrawal symptoms became severe again and librium is not enough, she should resume drinking at the lower possible amount.  She does not drive.  Her grandmother will be returning home with her - no indication of acute infection - WBC normal, UA without leuks or nitrites.   - UDS otherwise negative.  2. Hypokalemia  - likely in setting of poor nutrition/etoh use -  IV K ordered x 1 -  Mag is normal here. - will prescribe K for home  3. Rash - unclear etiology, but does not appear life threatening.  - doubt syphilis, no palmar  involvement - this may be related to lamictal, but doubt SJS/TENS per clinical exam - rash has been ongoing for 4 weeks, does not appear to have caused sloughing - advised continued skin care products - I will message her neurologist to attempt to arrange follow up     Final Clinical Impression(s) / ED Diagnoses Final diagnoses:  Hypokalemia  Seizure (HCC)  Alcohol withdrawal syndrome without complication (HCC)  Rash    Rx / DC Orders ED Discharge Orders         Ordered    chlordiazePOXIDE (LIBRIUM) 25 MG capsule        12/14/20 1621    potassium chloride (KLOR-CON) 10 MEQ tablet  Daily        12/14/20 1622           Terald Sleeper, MD 12/14/20 [phone]

## 2020-12-15 ENCOUNTER — Telehealth: Payer: Self-pay | Admitting: Neurology

## 2020-12-15 ENCOUNTER — Telehealth: Payer: Self-pay

## 2020-12-15 MED ORDER — ZONISAMIDE 100 MG PO CAPS: 100.0000 mg | ORAL_CAPSULE | Freq: Two times a day (BID) | ORAL | 11 refills | Status: DC

## 2020-12-15 NOTE — Telephone Encounter (Signed)
This medication was filled by ER physician that she saw yesterday so I do not need to feel at this time nor do I  not feel comfortable giving with her history of continued alcohol abuse and me not following up.  Orland Mustard, MD [facility]

## 2020-12-15 NOTE — Telephone Encounter (Signed)
Terald Sleeper, MD  Levert Feinstein, MD Hello Dr Terrace [place],   I saw [facility] the in ER today. She had a seizure which I suspect was related to alcohol withdrawal. We were able to discharge her home on Librium in the company of her grandmother, and we talked about etoh cessation and tapering.   She has a diffuse body rash that has developed over 4 weeks. I am wondering if it may be a side effect of her lamictal? It is not itching or painful. There was no sign of SJS, skin sloughing, or mucosal involvement.   I was hoping your office could reach out to her to help with a follow up visit.   Thank you,  Marguarite Arbour, MD    I have called patient, she developed rash since higher dose of lamotrigine, now spreading to more area, including inner thigh, neck,  Grandmother lives with her now, had a witnessed generalized seizure yesterday Dec 14, 2020, was treated at emergency room  Per record, she continue to drink alcohol on a daily basis, last drink was 2 days prior to seizure, woke up from seizure shaky, feels like she is in withdrawal, alcohol level was less than 10 Dec 14, 2020  I have advised patient to stop lamotrigine immediately, start zonisamide 100 mg twice a day.  Meds ordered this encounter  Medications  . zonisamide (ZONEGRAN) 100 MG capsule    Sig: Take 1 capsule (100 mg total) by mouth 2 (two) times daily.    Dispense:  60 capsule    Refill:  11

## 2020-12-15 NOTE — Telephone Encounter (Signed)
Patient was seen Ed on 5/26.  States she was informed to make an ED follow up within 3 days for withdrawals.  We do not have any openings for tomorrow.  Patient states she would like to at least get refill on chlordiazepoxide.  Would like to know if this can be sent to [facility].    Please advise.

## 2020-12-16 NOTE — Telephone Encounter (Signed)
I have notified patient.    I have also scheduled patient for a ED follow up and a TOC with Janeece Agee at [facility]

## 2020-12-20 ENCOUNTER — Ambulatory Visit: Payer: 59 | Admitting: Registered Nurse

## 2020-12-21 ENCOUNTER — Ambulatory Visit: Payer: 59 | Admitting: Registered Nurse

## 2020-12-21 ENCOUNTER — Other Ambulatory Visit: Payer: Self-pay

## 2020-12-21 ENCOUNTER — Encounter: Payer: Self-pay | Admitting: Registered Nurse

## 2020-12-21 VITALS — BP 128/94 | HR 131 | Temp 98.3°F | Resp 18 | Ht 66.0 in | Wt 185.2 lb

## 2020-12-21 DIAGNOSIS — L509 Urticaria, unspecified: Secondary | ICD-10-CM | POA: Diagnosis not present

## 2020-12-21 DIAGNOSIS — F1094 Alcohol use, unspecified with alcohol-induced mood disorder: Secondary | ICD-10-CM | POA: Diagnosis not present

## 2020-12-21 DIAGNOSIS — F330 Major depressive disorder, recurrent, mild: Secondary | ICD-10-CM

## 2020-12-21 DIAGNOSIS — F411 Generalized anxiety disorder: Secondary | ICD-10-CM

## 2020-12-21 DIAGNOSIS — F4001 Agoraphobia with panic disorder: Secondary | ICD-10-CM

## 2020-12-21 DIAGNOSIS — F1010 Alcohol abuse, uncomplicated: Secondary | ICD-10-CM | POA: Diagnosis not present

## 2020-12-21 MED ORDER — CHLORDIAZEPOXIDE HCL 25 MG PO CAPS: ORAL_CAPSULE | ORAL | 0 refills | Status: DC

## 2020-12-21 MED ORDER — CETIRIZINE HCL 10 MG PO TABS: 10.0000 mg | ORAL_TABLET | Freq: Every day | ORAL | 11 refills | Status: DC

## 2020-12-21 NOTE — Patient Instructions (Addendum)
Ms [place] -  [place] to meet you today. I can already say that you're doing better today than you were last week.   The biggest things you can do: Show up! - making it to appts is hard, but absolutely necessary for improvement Trust the Process! - this will take time, without any doubt. Trust that you have a good team looking out for you.   Labs today: CBC: blood counts CMP: liver and kidney function Urinalysis: some ketones and protein in urine last week, want to recheck A1c: blood sugar 3 month average TSH: Thyroid function, sometimes seen when there is seizures B12 and Folate: often decreased in those who misuse alcohol.   Some small tips to keep your alcohol consumption low:  Make smaller drinks, but drink the same number of drinks  Make weaker drinks, but drink the same number of drinks  Water down liquor, but don't use this to increase volume consumed  "every other" - instead of every drink containing alcohol, every other should be NA.   Alternatives: look into Mocktails, NA beer, NA kombucha, and others.  Distractions or limit setting: no drinks until you finish chores, go for a walk, etc.   These are not a perfect list. There is no set path to follow. But keep on pushing!   If you have lab work done today you will be contacted with your lab results within the next 2 weeks.  If you have not heard from [place] then please contact us. The fastest way to get your results is to register for My Chart.   IF you received an x-ray today, you will receive an invoice from [facility]. Please contact [facility] at [phone] with questions or concerns regarding your invoice.   IF you received labwork today, you will receive an invoice from [facility]. Please contact [facility] at [phone] with questions or concerns regarding your invoice.   Our billing staff will not be able to assist you with questions regarding bills from these companies.  You will be  contacted with the lab results as soon as they are available. The fastest way to get your results is to activate your My Chart account. Instructions are located on the last page of this paperwork. If you have not heard from [place] regarding the results in 2 weeks, please contact this office.      Alcohol Abuse and Dependence Information, Adult Alcohol is a widely available drug. People drink alcohol in different amounts. People who drink alcohol very often and in large amounts often have problems during and after drinking. They may develop what is called an alcohol use disorder. There are two main types of alcohol use disorders:  Alcohol abuse. This is when you use alcohol too much or too often. You may use alcohol to make yourself feel happy or to reduce stress. You may have a hard time setting a limit on the amount you drink.  Alcohol dependence. This is when you use alcohol consistently for a period of time, and your body changes as a result. This can make it hard to stop drinking because you may start to feel sick or feel different when you do not use alcohol. These symptoms are known as withdrawal. How can alcohol abuse and dependence affect me? Alcohol abuse and dependence can have a negative effect on your life. Drinking too much can lead to addiction. You may feel like you need alcohol to function normally. You may drink alcohol before work in the morning, during  the day, or as soon as you get home from work in the evening. These actions can result in:  Poor work performance.  Job loss.  Financial problems.  Car crashes or criminal charges from driving after drinking alcohol.  Problems in your relationships with friends and family.  Losing the trust and respect of coworkers, friends, and family. Drinking heavily over a long period of time can permanently damage your body and brain, and can cause lifelong health issues, such as:  Damage to your liver or pancreas.  Heart problems, high  blood pressure, or stroke.  Certain cancers.  Decreased ability to fight infections.  Brain or nerve damage.  Depression.  Early (premature) death. If you are careless or you crave alcohol, it is easy to drink more than your body can handle (overdose). Alcohol overdose is a serious situation that requires hospitalization. It may lead to permanent injuries or death. What can increase my risk?  Having a family history of alcohol abuse.  Having depression or other mental health conditions.  Beginning to drink at an early age.  Binge drinking often.  Experiencing trauma, stress, and an unstable home life during childhood.  Spending time with people who drink often. What actions can I take to prevent or manage alcohol abuse and dependence?  Do not drink alcohol if: ? Your health care provider tells you not to drink. ? You are pregnant, may be pregnant, or are planning to become pregnant.  If you drink alcohol: ? Limit how much you use to:  0-1 drink a day for women.  0-2 drinks a day for men. ? Be aware of how much alcohol is in your drink. In the [place] one drink equals one 12 oz bottle of beer (355 mL), one 5 oz glass of wine (148 mL), or one 1 oz glass of hard liquor (44 mL).  Stop drinking if you have been drinking too much. This can be very hard to do if you are used to abusing alcohol. If you begin to have withdrawal symptoms, talk with your health care provider or a person that you trust. These symptoms may include anxiety, shaky hands, headache, nausea, sweating, or not being able to sleep.  Choose to drink nonalcoholic beverages in social gatherings and places where there may be alcohol. Activity  Spend more time on activities that you enjoy that do not involve alcohol, like hobbies or exercise.  Find healthy ways to cope with stress, such as exercise, meditation, or spending time with people you care about. General information  Talk to your family, coworkers, and  friends about supporting you in your efforts to stop drinking. If they drink, ask them not to drink around you. Spend more time with people who do not drink alcohol.  If you think that you have an alcohol dependency problem: ? Tell friends or family about your concerns. ? Talk with your health care provider or another health professional about where to get help. ? Work with a Paramedic and a Network engineer. ? Consider joining a support group for people who struggle with alcohol abuse and dependence. Where to find support  Your health care provider.  SMART Recovery: www.smartrecovery.org Therapy and support groups  Local treatment centers or chemical dependency counselors.  Local AA groups in your community: SalaryStart.tn   Where to find more information  Centers for Disease Control and Prevention: FootballExhibition.com.br  [employer] on Alcohol Abuse and Alcoholism: BasicStudents.dk  Alcoholics Anonymous (AA): SalaryStart.tn Contact a health care provider  if:  You drank more or for longer than you intended on more than one occasion.  You tried to stop drinking or to cut back on how much you drink, but you were not able to.  You often drink to the point of vomiting or passing out.  You want to drink so badly that you cannot think about anything else.  You have problems in your life due to drinking, but you continue to drink.  You keep drinking even though you feel anxious, depressed, or have experienced memory loss.  You have stopped doing the things you used to enjoy in order to drink.  You have to drink more than you used to in order to get the effect you want.  You experience anxiety, sweating, nausea, shakiness, and trouble sleeping when you try to stop drinking. Get help right away if:  You have thoughts about hurting yourself or others.  You have serious withdrawal symptoms, including: ? Confusion. ? Racing heart. ? High blood pressure. ? Fever. If you  ever feel like you may hurt yourself or others, or have thoughts about taking your own life, get help right away. You can go to your nearest emergency department or call:  Your local emergency services (911 in the [place]  A suicide crisis helpline, such as the National Suicide Prevention Lifeline at 337-254-0849. This is open 24 hours a day. Summary  Alcohol abuse and dependence can have a negative effect on your life. Drinking too much or too often can lead to addiction.  If you drink alcohol, limit how much you use.  If you are having trouble keeping your drinking under control, find ways to change your behavior. Hobbies, calming activities, exercise, or support groups can help.  If you feel you need help with changing your drinking habits, talk with your health care provider, a good friend, or a therapist, or go to an AA group. This information is not intended to replace advice given to you by your health care provider. Make sure you discuss any questions you have with your health care provider. Document Revised: 10/28/2018 Document Reviewed: 09/16/2018 Elsevier Patient Education  2021 [employer].  Rethinking drinking (NIH Publication No. (408)075-9953). Retrieved from https://pubs.BikingRewards.com.cy.pdf">  Alcohol Use Disorder Alcohol use disorder is a condition in which drinking disrupts daily life. People with this condition drink too much alcohol and cannot control their drinking. Alcohol use disorder can cause serious problems with physical health. It can affect the brain, heart, and other internal organs. This disorder can raise the risk for certain cancers and cause problems with mental health, such as depression or anxiety. What are the causes? This condition is caused by drinking too much alcohol over time. Some people with this condition drink to cope with or escape from negative life events. Others drink to relieve pain or symptoms of  mental illness. What increases the risk? You are more likely to develop this condition if:  You have a family history of alcohol use disorder.  Your culture encourages drinking to the point of becoming drunk (intoxication).  You had a mood or conduct disorder in childhood.  You have been abused.  You are an adolescent and you: ? Have poor performance in school. ? Have poor supervision or guidance. ? Act on impulse and like taking risks. What are the signs or symptoms? Symptoms of this condition include:  Drinking more than you want to.  Trying several times without success to drink less.  Spending a lot of time  thinking about alcohol, getting alcohol, drinking, or recovering from drinking.  Continuing to drink even when it is causing serious problems in your daily life.  Drinking when it is dangerous to drink, such as before driving a car.  Needing more and more alcohol to get the same effect you want (building up tolerance).  Having symptoms of withdrawal when you stop drinking. Withdrawal symptoms may include: ? Trouble sleeping, leading to tiredness (fatigue). ? Mood swings of depression and anxiety. ? Physical symptoms, such as a fast heart rate, rapid breathing, high blood pressure (hypertension), fever, cold sweats, or nausea. ? Seizures. ? Severe confusion. ? Feeling or seeing things that are not there (hallucinations). ? Shaking movements that you cannot control (tremors). How is this diagnosed? This condition is diagnosed with an assessment. Your health care provider may start by asking three or four questions about your drinking, or he or she may give you a simple test to take. This helps to get clear information from you. You may also have a physical exam or lab tests. You may be referred to a substance abuse counselor. How is this treated? With education, some people with alcohol use disorder are able to reduce their drinking. Many with this disorder cannot  change their drinking behavior on their own and need help from substance use specialists. These specialists are counselors who can help diagnose how severe your disorder is and what type of treatment you need. Treatments may include:  Detoxification. Detoxification involves quitting drinking with supervision and direction of health care providers. Your health care provider may prescribe prescription medicines within the first week to help lessen withdrawal symptoms. Alcohol withdrawal can be dangerous and life-threatening. Detoxification may be provided in a home, community, or primary care setting, or in a hospital or substance use treatment facility.  Counseling. This may involve motivational interviewing (MI), family therapy, or cognitive behavioral therapy (CBT). It is provided by substance use treatment counselors or professional therapists. A counselor can address the things you can do to change your drinking behavior and how to maintain the changes. Talk therapy aims to: ? Identify your positive motivations to change. ? Identify and avoid the things that trigger your drinking. ? Help you learn how to plan your behavior change. ? Develop support systems that can help you sustain the change.  Medicines. Medicines can help treat this disorder by: ? Decreasing cravings. ? Decreasing the positive feeling you have when you drink. ? Causing an uncomfortable physical reaction when you drink (aversion therapy).  Mutual help groups such as Alcoholics Anonymous (AA). These groups are led by people who have quit drinking. The groups provide emotional support, advice, and guidance. Some people with this condition benefit from a combination of treatments provided by specialized substance use treatment centers.   Follow these instructions at home: Medicines  Take over-the-counter and prescription medicines only as told by your health care provider.  Ask before starting any new medicines, herbs, or  supplements. General instructions  Ask friends and family members to support your choice to stay sober.  Avoid situations where alcohol is served.  Create a plan to deal with tempting situations.  Attend support groups regularly.  Practice hobbies or activities you enjoy.  Do not drink and drive.  Keep all follow-up visits as told by your health care provider. This is important.   How is this prevented?  If you drink alcohol: ? Limit how much you use to:  0-1 drink a day for nonpregnant women.  0-2 drinks a day for men. ? Be aware of how much alcohol is in your drink. In the [place] one drink equals one 12 oz bottle of beer (355 mL), one 5 oz glass of wine (148 mL), or one 1 oz glass of hard liquor (44 mL).  If you have a mental health condition, seek treatment. Develop a healthy lifestyle through: ? Meditation or deep breathing. ? Exercise. ? Spending time in nature. ? Listening to music. ? Talking with a trusted friend or family member.  If you are an adolescent: ? Do not drink alcohol. Avoid gatherings where you might be tempted. ? Do not be afraid to say no if someone offers you alcohol. Speak up about why you do not want to drink. Set a positive example for others around you by not drinking. ? Build relationships with friends who do not drink. Where to find more information  Substance Abuse and Mental Health Services Administration: RockToxic.pl  Alcoholics Anonymous: CustomizedRugs.fi Contact a health care provider if:  You cannot take your medicines as told.  Your symptoms get worse or you experience symptoms of withdrawal when you stop drinking.  You start drinking again (relapse) and your symptoms get worse. Get help right away if:  You have thoughts about hurting yourself or others. If you ever feel like you may hurt yourself or others, or have thoughts about taking your own life, get help right away. Go to your nearest emergency department or:  Call your local  emergency services (911 in the [place]  Call a suicide crisis helpline, such as the National Suicide Prevention Lifeline at (650)631-4158. This is open 24 hours a day in the [place]  Text the Crisis Text Line at 248-587-3286 (in the [place] Summary  Alcohol use disorder is a condition in which drinking disrupts daily life. People with this condition drink too much alcohol and cannot control their drinking.  Treatment may include detoxification, counseling, medicines, and support groups.  Ask friends and family members to support you. Avoid situations where alcohol is served.  Get help right away if you have thoughts about hurting yourself or others. This information is not intended to replace advice given to you by your health care provider. Make sure you discuss any questions you have with your health care provider. Document Revised: 05/28/2019 Document Reviewed: 05/28/2019 Elsevier Patient Education  2021 Elsevier Inc.  Finding Treatment for Addiction Addiction is a complex disease of the brain that causes an uncontrollable (compulsive) need for:  A substance. This includes alcohol, illegal drugs, or prescription medicines, such as painkillers.  An activity or behavior, such as gambling or shopping. Addiction changes the way your brain works. Because of this change:  The need for the medicine, drug, or activity can become so strong that you think about it all the time.  Getting more and more of your addiction becomes the most important thing to you.  You may find yourself leaving other activities and relationships to pursue your addiction.  You can become physically dependent on a substance.  Your health, behavior, emotions, and relationships can change for the worse. How do I know if I need treatment for addiction? Addiction is a progressive disease. Without treatment, addiction can get worse. Living with addiction puts you at higher risk for injury, poor health, loss of employment,  loss of money, and even death. You might need treatment for addiction if:  You have tried to stop or cut down, but you have not succeeded.  You find  it annoying that your friends and family are concerned about your use or behavior.  You feel guilty about your use or behavior.  You need a particular substance or activity to start your day or to calm down.  You are running out of money because of your addiction.  You have done something illegal to support your addiction.  Your addiction has caused you: ? Health problems. ? Trouble in school, work, home, or with the police. ? To devote all your time to your addiction, and not to other responsibilities. ? To tell lies in order to hide your problem. What types of treatment are available? There may be options for treatment programs and plans based on your addiction, condition, needs, and preferences. No single treatment is right for everyone.  Treatment programs can be: ? Outpatient. You live at home and go to work or school, but you go to a clinic for treatment. ? Inpatient. You live and sleep at the program facility during treatment.  Programs may include: ? Medicine. You may need medicine to treat the addiction itself, or to treat anxiety or depression. ? Counseling and behavior therapy. This can help individuals and families behave in healthier ways and relate more effectively. ? Support groups. Confidential group therapy, such as a 12-step program, can help individuals and families during treatment and recovery. ? A combination of education, counseling, and a 12-step, spirituality-based approach.   What should I consider when selecting a treatment program? Think about your individual requirements when selecting a treatment program. Ask about:  The overall approach to treatment. ? Some programs are strictly 12-step programs. Some have a more flexible approach. ? Programs may differ in length of stay, setting, and size. ? Some  programs include your family in your treatment plan. Support may be offered to them throughout the treatment process, as well as instructions for them when you are discharged. ? You may continue to receive support after you have left the program.  The types of medical services that are offered. Find out if the program: ? Offers specific treatment for your particular addiction. ? Meets all of your needs, including physical and cultural needs. ? Includes any medicines you might need. ? Offers mental health counseling as part of your treatment. ? Offers the 12-step meetings at the center, or if transport is available for patients to attend meetings at other locations.  The cost and types of insurance that are accepted. ? Some programs are sponsored by the government. They support patients who do not have private insurance. ? If you do not have insurance, or if you choose to attend a program that does not accept your insurance, call the treatment center. Tell them your financial needs and whether a payment plan can be set up. ? There are also organizations that will help you find the resources for treatment. You can find them online by searching "treatment for addiction."  If the program is certified by the appropriate government agency. Where to find support  Your health care provider can help you to find the right treatment. These discussions are confidential.  The [employer] Council on Alcoholism and Drug Dependence (NCADD). This group has information about treatment centers and programs for people who have an addiction and for family members. ? Call: 1-800-NCA-CALL ((720)675-13711-(618)405-9903). ? Visit the website: https://www.ncadd.org/  The Substance Abuse and Mental Health Services Administration [facility](SAMHSA). This organization will help you find publicly funded treatment centers, help hotlines, and counseling services near you. ? Call: 1-800-662-HELP (303-629-35401-718 071 4873). ?  Visit the website:  www.findtreatment.RockToxic.pl  The National Problem Gambling Helpline. This is a 24-hour confidential helpline for gambling addiction. ? Call: 321-472-5094 ? Visit the website: CocoaInvestor.tn In countries outside of the [place] and [place], look in [employer] for contact information for services in your area. Follow these instructions at home:  Find supportive people who will help you stay away from your addiction and stay sober.  Do not use the substance or engage in the activity.  If you have been through treatment: ? Follow your plan. The plan is usually developed by you and your health care provider during treatment. ? Go to meetings with other people in recovery. ? Avoid people, situations, and things that lead you to do the things you are addicted to (triggers). Summary  Addiction changes the way your brain works. These changes cause a desire to repeat and increase the use of the a substance or behavior.  Addiction is a progressive disease. Without treatment, addiction can get worse. Living with addiction puts you at higher risk for injury, poor health, loss of employment, loss of money, and even death.  There may be options for treatment programs and plans based on your addiction, condition, needs, and preferences. No single treatment is right for everyone.  Your health care provider can help you to find the right treatment. These discussions are confidential. This information is not intended to replace advice given to you by your health care provider. Make sure you discuss any questions you have with your health care provider. Document Revised: 04/01/2019 Document Reviewed: 08/07/2017 Elsevier Patient Education  2021 Elsevier Inc.  Hives Hives (urticaria) are itchy, red, swollen areas on the skin. Hives can appear on any part of the body. Hives often fade within 24 hours (acute hives). Sometimes, new hives appear after old ones fade and the cycle can continue  for several days or weeks (chronic hives). Hives do not spread from person to person (are not contagious). Hives come from the body's reaction to something a person is allergic to (allergen), something that causes irritation, or various other triggers. When a person is exposed to a trigger, his or her body releases a chemical (histamine) that causes redness, itching, and swelling. Hives can appear right after exposure to a trigger or hours later. What are the causes? This condition may be caused by:  Allergies to foods or ingredients.  Insect bites or stings.  Exposure to pollen or pets.  Contact with latex or chemicals.  Spending time in sunlight, heat, or cold (exposure).  Exercise.  Stress.  Certain medicines. You can also get hives from other medical conditions and treatments, such as:  Viruses, including the common cold.  Bacterial infections, such as urinary tract infections and strep throat.  Certain medicines.  Allergy shots.  Blood transfusions. Sometimes, the cause of this condition is not known (idiopathic hives). What increases the risk? You are more likely to develop this condition if you:  Are a woman.  Have food allergies, especially to citrus fruits, milk, eggs, peanuts, tree nuts, or shellfish.  Are allergic to: ? Medicines. ? Latex. ? Insects. ? Animals. ? Pollen. What are the signs or symptoms? Common symptoms of this condition include raised, itchy, red or white bumps or patches on your skin. These areas may:  Become large and swollen (welts).  Change in shape and location, quickly and repeatedly.  Be separate hives or connect over a large area of skin.  Sting or become painful.  Turn white when  pressed in the center (blanch). In severe cases, yourhands, feet, and face may also become swollen. This may occur if hives develop deeper in your skin.   How is this diagnosed? This condition may be diagnosed by your symptoms, medical history,  and physical exam.  Your skin, urine, or blood may be tested to find out what is causing your hives and to rule out other health issues.  Your health care provider may also remove a small sample of skin from the affected area and examine it under a microscope (biopsy). How is this treated? Treatment for this condition depends on the cause and severity of your symptoms. Your health care provider may recommend using cool, wet cloths (cool compresses) or taking cool showers to relieve itching. Treatment may include:  Medicines that help: ? Relieve itching (antihistamines). ? Reduce swelling (corticosteroids). ? Treat infection (antibiotics).  An injectable medicine (omalizumab). Your health care provider may prescribe this if you have chronic idiopathic hives and you continue to have symptoms even after treatment with antihistamines. Severe cases may require an emergency injection of adrenaline (epinephrine) to prevent a life-threatening allergic reaction (anaphylaxis). Follow these instructions at home: Medicines  Take and apply over-the-counter and prescription medicines only as told by your health care provider.  If you were prescribed an antibiotic medicine, take it as told by your health care provider. Do not stop using the antibiotic even if you start to feel better. Skin care  Apply cool compresses to the affected areas.  Do not scratch or rub your skin. General instructions  Do not take hot showers or baths. This can make itching worse.  Do not wear tight-fitting clothing.  Use sunscreen and wear protective clothing when you are outside.  Avoid any substances that cause your hives. Keep a journal to help track what causes your hives. Write down: ? What medicines you take. ? What you eat and drink. ? What products you use on your skin.  Keep all follow-up visits as told by your health care provider. This is important. Contact a health care provider if:  Your symptoms are  not controlled with medicine.  Your joints are painful or swollen. Get help right away if:  You have a fever.  You have pain in your abdomen.  Your tongue or lips are swollen.  Your eyelids are swollen.  Your chest or throat feels tight.  You have trouble breathing or swallowing. These symptoms may represent a serious problem that is an emergency. Do not wait to see if the symptoms will go away. Get medical help right away. Call your local emergency services (911 in the [place] Do not drive yourself to the hospital. Summary  Hives (urticaria) are itchy, red, swollen areas on your skin. Hives come from the body's reaction to something a person is allergic to (allergen), something that causes irritation, or various other triggers.  Treatment for this condition depends on the cause and severity of your symptoms.  Avoid any substances that cause your hives. Keep a journal to help track what causes your hives.  Take and apply over-the-counter and prescription medicines only as told by your health care provider.  Keep all follow-up visits as told by your health care provider. This is important. This information is not intended to replace advice given to you by your health care provider. Make sure you discuss any questions you have with your health care provider. Document Revised: 01/22/2018 Document Reviewed: 01/22/2018 Elsevier Patient Education  2021 [employer].  Recovering  From Addiction Addiction is a complex disease of the brain. It causes an uncontrollable (compulsive) need for a substance. You can be addicted to substances including alcohol, tobacco, illegal drugs, or prescription medicines such as painkillers. Addiction can change the way that your brain works. It affects memory, behavior, and how you make decisions. Without treatment, addiction can get worse. However, with treatment and lifestyle changes, you can recover from addiction. What types of treatment are  available? The treatment program that is right for you will depend on many factors, including the type of addiction that you have. Treatment programs can be outpatient or inpatient. In an outpatient program, you live at home and go to work or school, but you also go to a clinic for treatment. With an inpatient program, you live and sleep at the program facility during treatment. Other treatment options include:  Medicine. ? Some addictions may be treated with prescription medicines. ? You may also need medicine to treat other mental health conditions such as anxiety or depression.  Counseling and behavior therapy. Therapy can help you learn new ways to respond to situations that are stressful or that tempt you to use the addictive substance.  Support groups. These include therapy groups and 12-step programs. These can help individuals and families during treatment and recovery. Examples of 12-step programs are Alcoholics Anonymous (AA) and Narcotics Anonymous (NA). How to manage lifestyle changes Managing stress Too much stress can lead to returning to the addiction (having a relapse). You need to find effective ways to manage your stress. Some techniques to cope with stress include:  Meditation, yoga, or deep breathing.  Exercise. Create an exercise routine that you enjoy and that allows you to work off some energy.  Creating or listening to music.  Muscle relaxation exercises.   Medicines Some medicines may make you feel calmer and help you have fewer cravings. If your health care provider prescribes medicines, make sure you:  Avoid using alcohol and other substances that may prevent your medicines from working properly (may interact).  Talk with your pharmacist or health care provider about all medicines that you take, the possible problems (side effects) that they can cause, and which medicines are safe to take together.  Make it your goal to take part in all treatment decisions  (shared decision-making). Ask about possible side effects of medicines that your health care provider recommends, and tell him or her how you feel about having those side effects. It is best if shared decision-making with your health care provider is part of your total treatment plan. Relationships Supportive relationships are very important in your recovery. When you are recovering from drug addiction, it will be important to avoid being around people who use drugs. For many people, this means developing new and different relationships. Some ways to do this include:  Developing trusting relationships with the people you meet in treatment or in AA or NA. These people share your desire to stop using substances (get sober) and to stay sober.  Getting a sponsor as a primary support person if you are attending a 12-step program.  Building relationships with people you meet through activities such as hobbies, volunteering, or exercising. How to recognize changes in your condition When recovering from an addiction, it is very common for a person to relapse and start using the substance again. Contact your sponsor, therapist, or health care provider to seek additional help if you experience the following:  Anxiety.  Excessive anger.  Isolating yourself from others.  Trouble sleeping.  Feeling depressed.  Loss of appetite.  Fantasies about using the substance. Where to find support Talking to others  You may be advised to see a family therapist along with members of your family. Family therapy can help you and your family understand what led you to addiction. Talk with your family about this approach.  Let your family members or friends know that they can help you through treatment. Support from loved ones will be important to help you maintain positive changes.   Financial resources Be sure to check with your insurance carrier to find out what treatment options are covered by your plan. You  may also be able to find financial assistance through not-for-profit organizations or with local government-based resources. If you are taking medicines, you may be able to get the generic form, which may be less expensive than brand-name medicine. Some makers of prescription medicines also offer help to patients who cannot afford the medicines that they need. Follow these instructions at home:  Take over-the-counter and prescription medicines only as told by your health care provider.  Stay in treatment until you complete the program. Take an active role in your treatment and your physical and emotional self-care. Develop a follow-up plan.  Keep all follow-up visits as told by your health care provider and counselor. This is important.  Eat a healthy diet, exercise regularly, and get enough sleep. Questions to ask your health care provider  If you are taking medicines: ? How long do I need to take medicine? ? Are there any long-term side effects of my medicine? ? Are there other options instead of taking medicine?  Would I benefit from therapy?  How often should I follow up with a health care provider? Contact a health care provider if:  You feel like you might relapse.  You have stopped taking your medicine. Get help right away if:  You have serious thoughts about hurting yourself or others. If you ever feel like you may hurt yourself or others, or have thoughts about taking your own life, get help right away. You can go to your nearest emergency department or call:  Your local emergency services (911 in the [place]  A suicide crisis helpline, such as the National Suicide Prevention Lifeline at (970) 734-0743. This is open 24 hours a day. Summary  With treatment and lifestyle changes, it is possible to recover from an addiction to substances like alcohol, tobacco, illegal drugs, or prescription medicines such as painkillers.  Find effective ways to manage your stress to avoid a  relapse. Some techniques to cope with stress include exercise, meditation, yoga, and deep breathing.  Let loved ones know that their support is important to help you recover.  If you have any signs that you may relapse, contact your 12-step sponsor, therapist, or health care provider to seek additional help. This information is not intended to replace advice given to you by your health care provider. Make sure you discuss any questions you have with your health care provider. Document Revised: 12/31/2019 Document Reviewed: 12/31/2019 Elsevier Patient Education  2021 [employer] Inc[employer]  Substance Use Disorder Substance use disorder occurs when a person's repeated use of drugs or alcohol interferes with his or her ability to be productive. This disorder can cause problems with mental and physical health. It can affect your ability to have healthy relationships, and it can keep you from being able to meet your responsibilities at work, home, or school. It can also lead to addiction, which  is a condition in which the person cannot stop using the substance consistently for a period of time. Addiction changes the way the brain works. Because of these changes, addiction is a chronic condition. Substance use disorder can be mild, moderate, or severe. The most commonly abused substances include:  Alcohol.  Tobacco.  Marijuana.  Stimulants, such as cocaine and methamphetamine.  Hallucinogens, such as LSD and PCP.  Opioids, such as some prescription pain medicines and heroin. What are the causes? This condition may develop due to many complex social, psychological, or physical reasons, such as:  Stress.  Abuse.  Peer pressure.  Anxiety or depression. What increases the risk? This condition is more likely to develop in people who:  Use substances to cope with stress.  Have been abused.  Have a mental health disorder, such as depression.  Have a family history of substance use  disorder. What are the signs or symptoms? Symptoms of this condition include:  Using the substance for longer periods of time or at a higher dosage than what is normal or intended.  Having a lasting desire to use the substance.  Being unable to slow down or stop the use of the substance.  Spending an abnormal amount of time getting the substance, using the substance, or recovering from using the substance.  Using the substance in a way that interferes with work, school, social activities, and personal relationships.  Using the substance even after having negative consequences, such as: ? Health problems. ? Legal or financial troubles. ? Job loss. ? Relationship problems.  Needing more and more of the substance to get the same effect (developing tolerance).  Experiencing unpleasant symptoms if you do not use the substance (withdrawal).  Using the substance to avoid withdrawal symptoms. How is this diagnosed? This condition may be diagnosed based on:  A physical exam.  Your history of substance use.  Your symptoms. This includes: ? How substance use affects your life. ? Changes in personality, behaviors, and mood. ? Having at least two symptoms of substance use disorder within a 64-month period. ? Health issues related to substance use, such as liver damage, shortness of breath, fatigue, cough, or heart problems.  Blood or urine tests to screen for alcohol and drugs. How is this treated? This condition may be treated by:  Stopping substance use safely. This may require taking medicines and being closely monitored for several days.  Taking part in group and individual counseling from mental health providers who help people with substance use disorder.  Staying at a live-in (residential) treatment center for several days or weeks.  Attending daily counseling sessions at a treatment center.  Taking medicine as told by your health care provider: ? To ease symptoms and  prevent complications during withdrawal. ? To treat other mental health issues, such as depression or anxiety. ? To block cravings by causing the same effects as the substance. ? To block the effects of the substance or replace good sensations with unpleasant ones.  Participating in a support group to share your experience with others who are going through the same thing. These groups are an important part of long-term recovery for many people. Recovery can be a long process. Many people who undergo treatment start using the substance again after stopping (relapse). If you relapse, that does not mean that treatment will not work.   Follow these instructions at home:  Take over-the-counter and prescription medicines only as told by your health care provider.  Do not use  any drugs or alcohol.  Avoid temptations or triggers that you associate with your use of the substance.  Learn and practice techniques for managing stress.  Have a plan for vulnerable moments. Get phone numbers of people who are willing to help and who are committed to your recovery.  Attend support groups on a regular basis. These groups include 12-step programs like Alcoholics Anonymous and Narcotics Anonymous.  Keep all follow-up visits as told by your health care providers. This is important. This includes continuing to work with therapists and support groups.   Contact a health care provider if:  You cannot take your medicines as told.  Your symptoms get worse.  You have trouble resisting the urge to use drugs or alcohol. Get help right away if you:  Relapse.  Think that you may have taken too much of a drug. The hotline of the [facility] is 206-751-1277.  Have signs of an overdose. Symptoms include: ? Chest pain. ? Confusion. ? Sleepiness or difficulty staying awake. ? Slowed breathing. ? Nausea or vomiting. ? A seizure.  Have serious thoughts about hurting yourself or someone  else. Drug overdose is an emergency. Do not wait to see if the symptoms will go away. Get medical help right away. Call your local emergency services (911 in the [place] Do not drive yourself to the hospital. If you ever feel like you may hurt yourself or others, or have thoughts about taking your own life, get help right away. You can go to your nearest emergency department or call:  Your local emergency services (911 in the [place]  A suicide crisis helpline, such as the National Suicide Prevention Lifeline at (581)709-5401. This is open 24 hours a day. Summary  Substance use disorder occurs when a person's repeated use of drugs or alcohol interferes with his or her ability to be productive.  Taking part in group and individual counseling from mental health providers is a common treatment for people with substance use disorder.  Recovery can be a long process. Many people who undergo treatment start using the substance again after stopping (relapse). A relapse does not mean that treatment will not work.  Attend support groups such as Alcoholics Anonymous and Narcotics Anonymous. These groups are an important part of long-term recovery for many people. This information is not intended to replace advice given to you by your health care provider. Make sure you discuss any questions you have with your health care provider. Document Revised: 10/30/2018 Document Reviewed: 08/20/2017 Elsevier Patient Education  2021 [employer].  Supporting Someone With an Addiction Addiction is a condition that causes an uncontrollable (compulsive) need for a substance or behavior. A person can be addicted to alcohol, illegal drugs, or prescription medicines, such as painkillers. An addiction can also be to a behavior, like gambling or shopping. Addiction can change the way a person's brain works. The need for the drug or activity can become so strong that the person thinks about it all the time or becomes  physically dependent on it. When a person has an addiction, his or her condition can affect others, such as friends and family members. Friends and family can help by offering support and understanding. What do I need to know about addiction? Addiction can cause problems with mental and physical health. It can affect a person's ability to have healthy relationships and to meet responsibilities at home and at work or school. Signs of addiction may include:  An intense craving for  a drug or activity.  Always thinking about the addiction.  Planning life activities around the addiction.  Being unable to stop using a drug or participating in a behavior.  Devoting more time to the addiction than to other responsibilities, like school, work, or family.  An increasing need for money. An addiction might lead a person to ask for unusual loans or steal items to sell.  An exaggerated response to difficult situations, such as: ? Extreme anxiety or irritability. ? Aggression. ? Lying.  Having trouble being realistic about the negative effects of a drug or activity. What do I need to know about the treatment options? Treatment for addiction and recovery can be a long process. Your loved one's treatment may involve:  Stopping substance use safely. This may require taking medicines and being closely observed for several days.  Group or individual counseling from mental health providers. Your loved one may attend daily counseling sessions at a treatment center.  Medicines to treat the addiction.  Going to a support group. These groups are an important part of long-term recovery for many people. They include 12-step programs like Alcoholics Anonymous (AA), Narcotics Anonymous (NA), or Gamblers Anonymous (GA). Many people who undergo treatment start the addiction again after stopping. This is called a relapse. If your loved one has a relapse, that does not mean that treatment will not work. Keep in mind  that:  This disorder involves the brain, the body, and the people in a person's life (social system). Changing unhealthy behaviors is a complex process that requires determination from your loved one.  Your loved one may need to try several times to recover.  Your support is important in helping your loved one to recover. A responsible adult may need to stay with your loved one for some time after treatment. This person can help your loved one stay on track with recovery and can watch for symptoms that are getting worse.   How can I support my loved one? Talk about the addiction  Be careful about too much prodding. Try not to overdo reminders to an adult friend or family member about things like taking medicines. Ask how your loved one prefers that you help.  Explain that it is not easy to quit because addiction can change the part of the brain that gives someone self-control. Also, some people can easily become addicted because of their family genes.  Never ignore comments about suicide, and do not try to avoid the subject of suicide. Talking about suicide will not make your loved one want to act on it. You or your loved one can reach out 24 hours a day to get free, private support (on the phone or a live online chat) from a suicide crisis helpline, such as the National Suicide Prevention Lifeline at 804-878-7592. Ask your loved one if you can go with him or her to meet with his or her health care provider or therapist.  Ask if your loved one is open to giving you written permission to communicate with his or her providers if needed. Find support and resources  Work with a health care provider who specializes in addiction.  Refer your loved one to trusted online resources that can provide information about addiction. A health care provider may be able to recommend resources. You could start with: ? Government sites such as the Substance Abuse and Museum/gallery exhibitions officer  [facility]): SkateOasis.com.pt ? National mental health organizations such as the [employer] on Mental Illness (NAMI):  www.nami.org  Look into local support groups or 12-step programs for your loved one.  Connect with people in peer and family support groups. People in these groups understand what you and your loved one are going through. They can help you feel a sense of comfort and connect you with local resources to help you learn more. General support  Make an effort to learn all you can about substance use disorder.  Help your loved one follow his or her treatment plan as directed by health care providers. This could mean driving him or her to therapy sessions or support group meetings.  Tell your loved one that you will keep giving support as long as he or she follows the treatment plan.  Assure your loved one that even though treatment may be hard, it often works. Substance use disorder can be managed.  Encourage your loved one to avoid things, people, and situations (triggers) that may cause a relapse.  Talk with your loved one's treatment center staff or health care provider about how you can keep supporting your loved one during treatment, recovery, and relapses if necessary. Follow these instructions at home: Safety  Talk with your loved one's health care provider about ways he or she can stay safe. This may include: ? Vaccinations. ? Medicines to prevent death from overdose. ? Referrals for a clean needle exchange program. ? Sexual health counseling.  If you believe that your loved one is driving while using drugs or alcohol, it is important to confront him or her about the dangers of driving while drunk or high. In some cases, you may need to call the police to prevent harm to your loved one or others. Lifestyle  Find ways to care for your body, mind, and well-being while supporting someone with an addiction, such as: ? Eat a healthy diet, exercise regularly, and get  plenty of sleep. ? Join a support group for family members of people with addiction. ? Seek individual therapy to help you learn to cope with your loved one's disorder. ? Try to maintain your normal routines. ? Make time for activities that help you relax, and try to not feel guilty about taking time for yourself. What are some signs that the addiction is getting worse?  Continuing to use more and more of a drug or do more and more of an activity over time.  Continuing the drug or activity even after using it has had negative consequences such as health problems or job loss.  Having physical symptoms when he or she tries to quit (withdrawal). Symptoms depend on the drug or substance, but general symptoms may include: ? Nausea or vomiting. ? Restlessness and irritability. ? Sweating.  A feeling in you that you are powerless to help your loved one get better. Get help right away if:  Your loved one is showing signs that he or she is thinking about hurting himself, herself, or someone else. If you ever feel like your loved one may hurt himself or herself or others, or may have thoughts about taking his or her own life, get help right away. You can go to your nearest emergency department or call:  Your local emergency services (911 in the [place]  A suicide crisis helpline, such as the National Suicide Prevention Lifeline at 619-214-6888. This is open 24 hours a day. Summary  Addiction is a condition that causes an uncontrollable (compulsive) need for a substance or behavior.  Treatment for addiction may include group or individual counseling  and support groups.  Many people who undergo treatment start the addiction again after stopping. If your loved one has a relapse, that does not mean that treatment will not work.  Find ways to care for your body, mind, and well-being while supporting someone with an addiction.  If you ever feel like your loved one may hurt himself or herself or  others, or may have thoughts about taking his or her own life, get help right away. This information is not intended to replace advice given to you by your health care provider. Make sure you discuss any questions you have with your health care provider. Document Revised: 09/17/2019 Document Reviewed: 08/21/2017 Elsevier Patient Education  2021 Elsevier Inc.  Substance Use Disorder and Mental Illness Substance use disorder is a condition in which a person is dependent on a substance, such as drugs or alcohol. A mental illness is a condition that occurs when someone experiences changes in mood, behavior, or thinking. Sometimes, these two conditions can occur at the same time (co-occurring disorders) and may be diagnosed together (dual diagnosis). What is the relationship between substance use disorder and mental illness? Substance use disorder and mental illness can share symptoms and can have similar causes, such as exposure to stress or changes in brain chemicals. The risk for developing both of these conditions can be passed from parent to child (inherited). People with mental illnesses sometimes use drugs to try and relieve symptoms, and this can lead to substance use disorder. Substance use disorders occur more often in people who have depression, schizophrenia, anxiety, or personality disorders. When some drugs are used regularly, they can cause people to have symptoms of mental illness. What are the signs or symptoms? Symptoms vary widely for substance use disorder and mental illness, especially because these conditions can be present at the same time. Signs of a substance use disorder may include:  Failure to meet responsibilities at home, work, or school.  Using substances in risky situation, such as while driving or using machinery.  Taking serious risks to get drugs or alcohol.  Spending less time on activities or hobbies that used to be important.  Changes in personality or attitude  for no reason, such as angry outbursts, symptoms of anxiety, or unusual giddiness.  Trying to hide the amount of drugs or alcohol used.  Increased substance use over time, or needing to use more of a substance to feel the same effects (developing a tolerance).  Sudden weight loss or gain.  Sleeping too much or too little.  Uncontrolled trembling or shaking (tremors), slurred speech, or lack of coordination.  Continuing to use alcohol or a drug even though using it has led to bad outcomes or consequences, such as losing a job or ending a relationship. Signs of mental illness may include:  Extreme mood changes (mood swings).  Sleeping too much or too little.  Weight loss or weight gain.  Being easily distracted.  Confused thinking or trouble concentrating.  Expressing thoughts of suicide.  Withdrawing from friends and family, or sudden changes in social behaviors or hobbies.  Aggression toward people and animals.  Repeatedly breaking serious rules or breaking the law.  Having persistent thoughts or urges that are unpleasant or feel out of control (involuntary). The person may feel the need to act on the urges in order to reduce anxiety.  Persistent feelings of sadness or hopelessness.  False beliefs (delusions).  Seeing, hearing, tasting, smelling, or feeling things that are not real (hallucinations). How is  this diagnosed? Substance use disorder and mental illnesses can be difficult to diagnose at the same time because of how varied and complex the symptoms are. Because these conditions can interact, one condition may be missed. This is why it is important to be completely honest with your health care provider about substance use and your other symptoms. Diagnosing your condition may include:  A physical exam.  A review of your medical history and your symptoms. Your health care provider may refer you to a mental health professional for a psychiatric evaluation. This may  include assessments of:  Your use of substances.  Your risk of suicide.  Your risk of aggressive behaviors.  Your lifestyle, environment, and social situations.  Your medical health.  Your mental health and behavioral history. How is this treated? It is best to treat substance use disorder and mental illness at the same time (integrated treatment approach). Treatment usually involves more than one of the following methods:  Detox. This refers to stopping substance abuse while being monitored by trained medical staff. This is usually the first step in treatment. Detox can last for up to 7 days.  Rehabilitation. This involves staying in a treatment center where you can have medical and mental health support all the time.  Medicines to relieve symptoms of mental illness and to control symptoms that are caused by stopping substance abuse (withdrawal symptoms).  Support groups. These groups encourage you to talk about your fears, frustrations, and anxieties with others who have the same condition.  Talk therapy. This is one-on-one therapy that can help you learn about your illness and learn ways to cope with symptoms or side effects.   Follow these instructions at home: Lifestyle  Exercise regularly. Aim for 150 minutes of moderate exercise (such as walking or biking) or 75 minutes of vigorous exercise (such as running) each week.  Eat a healthy diet with plenty of fruits and vegetables, whole grains, and lean proteins.  Avoid caffeine and tobacco. These can worsen symptoms and anxiety.  Do not drink alcohol or use drugs.  Try to get 7-9 hours of sleep each night. To do this: ? Keep your bedroom cool and dark. ? Do not eat a heavy meal during the hour before you go to bed. ? Do not have caffeine before bedtime. ? Avoid screen time during the few hours before bedtime. This means not watching TV and not using a computer, cell phone, or tablet. Medicines  Take over-the-counter and  prescription medicines only as told by your health care provider.  Do not stop taking medicines unless you ask your health care provider if it is safe to do that.  Tell your health care provider about any medicine side effects that you experience. General instructions  Follow your treatment plan as directed. Work with your health care provider to adjust your treatment plan as needed.  Attend support group or therapy sessions as directed.  Explain your diagnosis to your friends and family. Let them know what your symptoms are and what things cause your symptoms to start (triggers).  Avoid triggers or stressors that may worsen your symptoms or cause you to use alcohol or drugs. Spend time with family and friends who do not use substances.  Make time to relax and do self-soothing activities, such as meditating or listening to music.  Keep all follow-up visits as told by your health care provider and therapist. This is important. Where to find support You may find support for coping with substance  use disorder and mental illness from:  Your health care providers or your therapist. These providers can treat you or they can help you find services to treat your condition.  Local support groups for people with your condition. Your health care provider or therapist may be able to recommend a support group. This may be a hospital support group, a [employer] on Mental Illness (NAMI) support group, or a 12-step group such as Alcoholics Anonymous (AA) or Narcotics Anonymous (NA).  Family and friends. Let them know what they can do to best support you through your recovery process. Where to find more information You may find more information about substance use disorder and mental illness from:  Substance Abuse and Mental Health Services Administration [facility]): ? Online: SkateOasis.com.pt ? [employer], to talk with a person who can help you find information about treatment services in  your area: 912-161-2017 6825226900)  [place] Department of Health and Health and safety inspector mental health services: https://www.vaughan-marshall.com/  National Alliance on Mental Illness (NAMI): www.nami.org  [employer] on Alcoholism and Drug Dependence: www.ncadd.org Contact a health care provider if:  Your symptoms get worse or they do not get better.  You have negative side effects from taking medicines.  You want to discuss stopping medicines or treatment.  You start using a substance again (have a relapse). Get help right away if:  You have thoughts about harming yourself or others. If you ever feel like you may hurt yourself or others, or have thoughts about taking your own life, get help right away. You can go to your nearest emergency department or call:  Your local emergency services (911 in the [place]  A suicide crisis helpline, such as the National Suicide Prevention Lifeline at 607-136-0897. This is open 24 hours a day. Summary  Substance use disorder and mental illness can occur at the same time (co-occurring disorders), and they may be diagnosed together (dual diagnosis).  These conditions can be difficult to diagnose at the same time. It is important to be completely honest with your health care provider about your substance use and your other symptoms.  It is best to treat substance use disorder and mental illness at the same time (integrated treatment approach).  Identifying new ways to deal with triggers and difficult situations is an important part of recovery.  Keep all follow-up visits as told by your health care provider and therapist. Be consistent when attending support groups or treatment programs. This information is not intended to replace advice given to you by your health care provider. Make sure you discuss any questions you have with your health care provider. Document Revised: 09/04/2018 Document Reviewed: 11/23/2016 Elsevier Patient Education  2021  [employer].

## 2020-12-22 ENCOUNTER — Telehealth: Payer: Self-pay

## 2020-12-22 LAB — CBC WITH DIFFERENTIAL/PLATELET
Basophils Absolute: 0.1 10*3/uL (ref 0.0–0.1)
Basophils Relative: 1.6 % (ref 0.0–3.0)
Eosinophils Absolute: 0.2 10*3/uL (ref 0.0–0.7)
Eosinophils Relative: 4 % (ref 0.0–5.0)
HCT: 38.2 % (ref 36.0–46.0)
Hemoglobin: 12.5 g/dL (ref 12.0–15.0)
Lymphocytes Relative: 53.1 % — ABNORMAL HIGH (ref 12.0–46.0)
Lymphs Abs: 2.4 10*3/uL (ref 0.7–4.0)
MCHC: 32.8 g/dL (ref 30.0–36.0)
MCV: 101.2 fl — ABNORMAL HIGH (ref 78.0–100.0)
Monocytes Absolute: 0.4 10*3/uL (ref 0.1–1.0)
Monocytes Relative: 9.3 % (ref 3.0–12.0)
Neutro Abs: 1.4 10*3/uL (ref 1.4–7.7)
Neutrophils Relative %: 32 % — ABNORMAL LOW (ref 43.0–77.0)
Platelets: 255 10*3/uL (ref 150.0–400.0)
RBC: 3.78 Mil/uL — ABNORMAL LOW (ref 3.87–5.11)
RDW: 15.3 % (ref 11.5–15.5)
WBC: 4.5 10*3/uL (ref 4.0–10.5)

## 2020-12-22 LAB — URINALYSIS
Bilirubin Urine: NEGATIVE
Hgb urine dipstick: NEGATIVE
Ketones, ur: NEGATIVE
Leukocytes,Ua: NEGATIVE
Nitrite: NEGATIVE
Specific Gravity, Urine: 1.015 (ref 1.000–1.030)
Total Protein, Urine: NEGATIVE
Urine Glucose: NEGATIVE
Urobilinogen, UA: 0.2 (ref 0.0–1.0)
pH: 7.5 (ref 5.0–8.0)

## 2020-12-22 LAB — HEMOGLOBIN A1C: Hgb A1c MFr Bld: 5.1 % (ref 4.6–6.5)

## 2020-12-22 LAB — B12 AND FOLATE PANEL
Folate: 4.2 ng/mL — ABNORMAL LOW (ref >5.9)
Vitamin B-12: 407 pg/mL (ref 211–911)

## 2020-12-22 LAB — TSH: TSH: 2.79 u[IU]/mL (ref 0.35–4.50)

## 2020-12-22 NOTE — Telephone Encounter (Signed)
Patient is calling stating that she has been experiencing incontinence for 2 weeks now.  States that she believes its due to taking librium or mirtazapine.  Would like to know if medication can be changed?  Please advise in regard or if patient needs appt.

## 2020-12-22 NOTE — Telephone Encounter (Signed)
Patient was seen in office yesterday. I will reach out to her.

## 2020-12-23 LAB — COMPREHENSIVE METABOLIC PANEL
ALT: 18 U/L (ref 0–35)
AST: 51 U/L — ABNORMAL HIGH (ref 0–37)
Albumin: 4.5 g/dL (ref 3.5–5.2)
Alkaline Phosphatase: 72 U/L (ref 39–117)
BUN: 12 mg/dL (ref 6–23)
CO2: 18 mEq/L — ABNORMAL LOW (ref 19–32)
Calcium: 9.2 mg/dL (ref 8.4–10.5)
Chloride: 108 mEq/L (ref 96–112)
Creatinine, Ser: 0.87 mg/dL (ref 0.40–1.20)
GFR: 93.48 mL/min (ref >60.00)
Glucose, Bld: 85 mg/dL (ref 70–99)
Potassium: 4.6 mEq/L (ref 3.5–5.1)
Sodium: 146 mEq/L — ABNORMAL HIGH (ref 135–145)
Total Bilirubin: 0.2 mg/dL (ref 0.2–1.2)
Total Protein: 7.3 g/dL (ref 6.0–8.3)

## 2020-12-23 NOTE — Telephone Encounter (Signed)
Left vm message for the patient with NP Morrow's recommendations.

## 2020-12-23 NOTE — Telephone Encounter (Signed)
We can call patient - likely not related to either of these medications, more likely related to alcohol intake - more reason for her to slow her intake. Can do further work up when she returns or welcome to come in sooner if she'd prefer.  Thanks,  Luan Pulling

## 2020-12-27 NOTE — Progress Notes (Signed)
New Patient Office Visit  Subjective:  Patient ID: Natasha Pope, female    DOB: 09/11/1995  Age: 25 y.o. MRN: [id number]  CC:  Chief Complaint  Patient presents with   Hospitalization Follow-up    Patient states she is here for her follow up on rash that she seen the ED about.    HPI [place] presents for ER follow up   Rash: improving, no new symptoms or concerns.  EtOH withdrawal: Pt continues to consume alcohol. Using heavily daily. Endorses a few "cups" of liquor each day but nonspecific on actual volume. Has been heavy drinker for some time, unfortunately her immediate family has ceased communication with her over this. Her grandmother is here today - lives in [place] area but comes down for weeks at a time when possible.  She does state that she wants to quit, expresses regret for where she is currently at, and does say she "doesn't know how this happened" Is working on tapering on her own Group support like AA is not appealing to her at this time - feels to tender and new to alcohol cessation Does state that librium has helped her delay start of drinking each day by a significant margin, making it easier to limit how much she drinks daily.    Past Medical History:  Diagnosis Date   Anxiety    Asthma    Depression    Pyelonephritis    Seizure-like activity (HCC)    Sepsis (HCC)     Past Surgical History:  Procedure Laterality Date   dental procedure      Family History  Problem Relation Age of Onset   Depression Mother    Miscarriages / [place] Mother    Thyroid disease Mother    Hypertension Father    Alcohol abuse Father    Learning disabilities Sister    Hyperlipidemia Maternal Grandmother    Alcohol abuse Maternal Grandfather    Alcohol abuse Paternal Grandmother    Hypertension Paternal Grandfather     Social History   Socioeconomic History   Marital status: Single    Spouse name: Not on file   Number of children: 0    Years of education: some college   Highest education level: Not on file  Occupational History   Occupation: unemployed  Tobacco Use   Smoking status: Former Smoker    Packs/day: 0.25    Types: Cigarettes    Quit date: 05/10/2018    Years since quitting: 2.6   Smokeless tobacco: Never Used  Vaping Use   Vaping Use: Never used  Substance and Sexual Activity   Alcohol use: Yes    Comment: 10/03/20: 16-24 ounces of liquor per day   Drug use: Not Currently    Types: Marijuana    Comment: 10/03/20: No longer using. Last use was 4 months ago.   Sexual activity: Not on file  Other Topics Concern   Not on file  Social History Narrative   Lives alone but her grandmother checks on her frequently.   Left-handed.   No daily use of caffeine.   Social Determinants of Health   Financial Resource Strain: Not on file  Food Insecurity: Not on file  Transportation Needs: Not on file  Physical Activity: Not on file  Stress: Not on file  Social Connections: Not on file  Intimate Partner Violence: Not on file    ROS Review of Systems  Objective:   Today's Vitals: BP (!) 128/94   Pulse Marland Kitchen)  131   Temp 98.3 F (36.8 C) (Temporal)   Resp 18   Ht 5\' 6"  (1.676 m)   Wt 185 lb 3.2 oz (84 kg)   LMP 12/10/2020   SpO2 99%   BMI 29.89 kg/m   Physical Exam  Assessment & Plan:   Problem List Items Addressed This Visit       Nervous and Auditory   Alcohol use with alcohol-induced mood disorder (HCC)   Relevant Orders   Ambulatory referral to Psychology   Ambulatory referral to Psychiatry     Other   GAD (generalized anxiety disorder)   Relevant Medications   chlordiazePOXIDE (LIBRIUM) 25 MG capsule   Other Relevant Orders   Ambulatory referral to Psychology   Ambulatory referral to Psychiatry   Alcohol abuse   Relevant Medications   chlordiazePOXIDE (LIBRIUM) 25 MG capsule   Other Relevant Orders   CBC with Differential/Platelet (Completed)   Comprehensive metabolic panel  (Completed)   Hemoglobin A1c (Completed)   B12 and Folate Panel (Completed)   Urinalysis (Completed)   TSH (Completed)   Recurrent major depressive disorder (HCC)   Relevant Medications   chlordiazePOXIDE (LIBRIUM) 25 MG capsule   Other Relevant Orders   Ambulatory referral to Psychology   Ambulatory referral to Psychiatry   Panic disorder with agoraphobia   Relevant Medications   chlordiazePOXIDE (LIBRIUM) 25 MG capsule   Other Relevant Orders   Ambulatory referral to Psychology   Ambulatory referral to Psychiatry      Other Visit Diagnoses     Urticaria    -  Primary   Relevant Medications   cetirizine (ZYRTEC) 10 MG tablet       Outpatient Encounter Medications as of 12/21/2020  Medication Sig   cetirizine (ZYRTEC) 10 MG tablet Take 1 tablet (10 mg total) by mouth daily.   DULoxetine (CYMBALTA) 60 MG capsule TAKE ONE CAPSULE BY MOUTH ONE TIME DAILY   fluticasone (FLONASE) 50 MCG/ACT nasal spray Place 2 sprays into both nostrils daily. (Patient taking differently: Place 2 sprays into both nostrils daily as needed for allergies.)   gabapentin (NEURONTIN) 100 MG capsule TAKE ONE CAPSULE BY MOUTH TWICE DAILY   HAILEY FE 1/20 1-20 MG-MCG tablet TAKE ONE TABLET BY MOUTH ONE TIME DAILY   hydrOXYzine (ATARAX/VISTARIL) 25 MG tablet Take 1 tablet (25 mg total) by mouth 3 (three) times daily.   ibuprofen (ADVIL) 200 MG tablet Take 1-2 tablets (200-400 mg total) by mouth 3 (three) times daily as needed for headache or moderate pain.   lamoTRIgine (LAMICTAL) 100 MG tablet Take 1 tablet (100 mg total) by mouth 2 (two) times daily.   methocarbamol (ROBAXIN) 750 MG tablet TAKE ONE TABLET BY MOUTH EVERY EIGHT HOURS AS NEEDED FOR MUSCLE SPASM   mirtazapine (REMERON) 15 MG tablet Take 1 tablet (15 mg total) by mouth at bedtime.   naltrexone (DEPADE) 50 MG tablet TAKE ONE TABLET BY MOUTH ONE TIME DAILY   ondansetron (ZOFRAN-ODT) 4 MG disintegrating tablet Take 1 tablet (4 mg total) by mouth  every 8 (eight) hours as needed for nausea or vomiting.   potassium chloride (KLOR-CON) 10 MEQ tablet Take 1 tablet (10 mEq total) by mouth daily.   triamcinolone cream (KENALOG) 0.1 % Apply to affected area 1-2 times daily   zonisamide (ZONEGRAN) 100 MG capsule Take 1 capsule (100 mg total) by mouth 2 (two) times daily.   [DISCONTINUED] chlordiazePOXIDE (LIBRIUM) 25 MG capsule 50mg  PO TID x 1D, then 25-50mg  PO BID X 1D,  then 25-50mg  PO QD X 1D   chlordiazePOXIDE (LIBRIUM) 25 MG capsule 50mg  PO TID x 1D, then 25-50mg  PO BID X 1D, then 25-50mg  PO QD X 1D   traZODone (DESYREL) 100 MG tablet Take 2 tablets (200 mg total) by mouth at bedtime as needed for sleep.   No facility-administered encounter medications on file as of 12/21/2020.    Follow-up: No follow-ups on file.   PLAN Discussed alcohol abuse at length. This seems to be the source of her medical and psychosocial issues. She has no doubt undergone a lot of strife and tragedy, losing a number of close friends to OD and trauma. She is very hesitant on the idea of therapy and psychiatry at this time, but agreeable to have referrals placed for future visits - acknowledges these are necessary for improvement. Resistant to group therapy such as AA - feels that acknowledging and addressing addiction is still to new to her at this time. Has done well with librium in past. Helps her defer alcohol use and avoid complications of withdrawal. Discussed that in theory, benzodiazepines are very problematic and can lower seizure threshold, cause dependence, and themselves provide withdrawal effects, but in interest of avoiding hospital readmission, will give short course. Had in depth discussion regarding risks of taking librium with alcohol, patient and guardian demonstrate understanding. Zyrtec for rash. Seems to be improving. Likely drug rash from lamictal which has been Dc'd Follow up in 3-4 weeks. Patient encouraged to call clinic with any questions,  comments, or concerns.  I spent 56 minutes with this patient on hospital follow up and developing a multi-disciplinary plan to address substance use disorder.  02/20/2021, NP

## 2020-12-30 ENCOUNTER — Other Ambulatory Visit: Payer: Self-pay | Admitting: Registered Nurse

## 2020-12-30 DIAGNOSIS — L509 Urticaria, unspecified: Secondary | ICD-10-CM

## 2020-12-30 MED ORDER — CETIRIZINE HCL 10 MG PO TABS: 10.0000 mg | ORAL_TABLET | Freq: Every day | ORAL | 0 refills | Status: DC

## 2021-01-02 NOTE — Progress Notes (Signed)
PATIENT: Natasha Pope DOB: 09-Sep-1995  REASON FOR VISIT: follow up HISTORY FROM: patient Primary Neurologist: Dr. Terrace Arabia  HISTORY OF PRESENT ILLNESS: Today 01/03/21  HISTORY  Natasha Pope is a 25 year old female, seen in request by her primary care physician Natasha Pope, for evaluation of seizure, she is accompanied by her grandmother at today's visit on October 03, 2020   I reviewed and summarized the referring note.PMHX. Depression, anxiety, Cymbalta since May 2020, Gabapentin 100mg  daily, Panic attack, at least twice a day, taking hydroxyzine prn, Insomnia, trazodone 100mg  2 tabs qhs.   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 [employer], but she quit since November 2021, she lives alone, grandmother is visiting from [place]   She was born full-term, developmentally normal, has been doing well well until around 2018, she began to drink alcohol, gradually increased alcohol use, reported 2- three 8 ounce of hard liquor daily, also smokes marijuana   First seizure was in May 2020, again in June 2020, leading to hospital admission, current seizure on August 31, 2020, February 10, March 9, for 2 episode, most recent one was on September 29, 2020, grandmother was able to videotape, it was clearly a generalized tonic-clonic seizure, patient was sitting in sofa, foaming coming out of her mouth, body tonic-clonic movement, lasting a few minutes, postevent confusion.  Patient has been drinking in those days when she was having a seizure   She denied lateralized motor or sensory deficit, no family history of seizure  Update January 03, 2021 SS: Here today with her grandmother, grandmother going back to [place] soon. Natasha Pope is trying to get another job, with [employer] or [employer], lost her job due to attendance issues.   EEG in March 2022 was mildly abnormal,  mild evidence of background slowing, no evidence of epileptiform discharge.  Could be related to her polypharmacy treatment, moderate to severe daily alcohol use. HR was elevated.  MRI of the brain with and without contrast was normal.  Witnessed generalized seizure Dec 14, 2020, seen in the emergency room, went 2 days without alcohol, felt to be in withdrawal.  She was noted to have a generalized rash, tapered off Lamictal immediately, started on Zonegran 100 mg twice daily. This seizure was longer than 2 minutes.   Rash almost all gone, started on her face, then spread to trunk and thighs. Was blotchy and purple.   Total of 7 seizures in her life. All are related to ETOH, heavy drinking or sudden cessation. Right now having 2-4 drinks of tequila, before was drinking 1 bottle of tequila an hour, would go back to [facility] store, if she didn't go to sleep.   Saw psychiatry in hospital, did few virtual visits after hospitalizations. Is off Librium. Living in [address] currently.   REVIEW OF SYSTEMS: Out of a complete 14 system review of symptoms, the patient complains only of the following symptoms, and all other reviewed systems are negative.  Seizures  ALLERGIES: No Known Allergies  HOME MEDICATIONS: Outpatient Medications Prior to Visit  Medication Sig Dispense Refill   cetirizine (ZYRTEC) 10 MG tablet Take 1 tablet (10 mg total) by mouth daily. 365 tablet 0   chlordiazePOXIDE (LIBRIUM) 25 MG capsule 50mg  PO TID x 1D, then 25-50mg  PO BID X 1D, then 25-50mg  PO QD X 1D 10 capsule 0  DULoxetine (CYMBALTA) 60 MG capsule TAKE ONE CAPSULE BY MOUTH ONE TIME DAILY 90 capsule 0   fluticasone (FLONASE) 50 MCG/ACT nasal spray Place 2 sprays into both nostrils daily. (Patient taking differently: Place 2 sprays into both nostrils daily as needed for allergies.) 16 g 6   gabapentin (NEURONTIN) 100 MG capsule TAKE ONE CAPSULE BY MOUTH TWICE DAILY 60 capsule 0   HAILEY FE 1/20 1-20 MG-MCG tablet TAKE ONE  TABLET BY MOUTH ONE TIME DAILY 84 tablet 0   hydrOXYzine (ATARAX/VISTARIL) 25 MG tablet Take 1 tablet (25 mg total) by mouth 3 (three) times daily. 90 tablet 2   ibuprofen (ADVIL) 200 MG tablet Take 1-2 tablets (200-400 mg total) by mouth 3 (three) times daily as needed for headache or moderate pain. 30 tablet 0   methocarbamol (ROBAXIN) 750 MG tablet TAKE ONE TABLET BY MOUTH EVERY EIGHT HOURS AS NEEDED FOR MUSCLE SPASM 30 tablet 0   mirtazapine (REMERON) 15 MG tablet Take 1 tablet (15 mg total) by mouth at bedtime. 30 tablet 2   naltrexone (DEPADE) 50 MG tablet TAKE ONE TABLET BY MOUTH ONE TIME DAILY 30 tablet 0   ondansetron (ZOFRAN-ODT) 4 MG disintegrating tablet Take 1 tablet (4 mg total) by mouth every 8 (eight) hours as needed for nausea or vomiting. 20 tablet 0   potassium chloride (KLOR-CON) 10 MEQ tablet Take 1 tablet (10 mEq total) by mouth daily. 30 tablet 0   triamcinolone cream (KENALOG) 0.1 % Apply to affected area 1-2 times daily 30 g 0   zonisamide (ZONEGRAN) 100 MG capsule Take 1 capsule (100 mg total) by mouth 2 (two) times daily. 60 capsule 11   lamoTRIgine (LAMICTAL) 100 MG tablet Take 1 tablet (100 mg total) by mouth 2 (two) times daily. 60 tablet 11   traZODone (DESYREL) 100 MG tablet Take 2 tablets (200 mg total) by mouth at bedtime as needed for sleep. 60 tablet 3   No facility-administered medications prior to visit.    PAST MEDICAL HISTORY: Past Medical History:  Diagnosis Date   Anxiety    Asthma    Depression    Pyelonephritis    Seizure-like activity (HCC)    Sepsis (HCC)     PAST SURGICAL HISTORY: Past Surgical History:  Procedure Laterality Date   dental procedure      FAMILY HISTORY: Family History  Problem Relation Age of Onset   Depression Mother    Miscarriages / [place] Mother    Thyroid disease Mother    Hypertension Father    Alcohol abuse Father    Learning disabilities Sister    Hyperlipidemia Maternal Grandmother    Alcohol abuse  Maternal Grandfather    Alcohol abuse Paternal Grandmother    Hypertension Paternal Grandfather     SOCIAL HISTORY: Social History   Socioeconomic History   Marital status: Single    Spouse name: Not on file   Number of children: 0   Years of education: some college   Highest education level: Not on file  Occupational History   Occupation: unemployed  Tobacco Use   Smoking status: Former    Packs/day: 0.25    Pack years: 0.00    Types: Cigarettes    Quit date: 05/10/2018    Years since quitting: 2.6   Smokeless tobacco: Never  Vaping Use   Vaping Use: Never used  Substance and Sexual Activity   Alcohol use: Yes    Comment: 10/03/20: 16-24 ounces of liquor per day   Drug use:  Not Currently    Types: Marijuana    Comment: 10/03/20: No longer using. Last use was 4 months ago.   Sexual activity: Not on file  Other Topics Concern   Not on file  Social History Narrative   Lives alone but her grandmother checks on her frequently.   Left-handed.   No daily use of caffeine.   Social Determinants of Health   Financial Resource Strain: Not on file  Food Insecurity: Not on file  Transportation Needs: Not on file  Physical Activity: Not on file  Stress: Not on file  Social Connections: Not on file  Intimate Partner Violence: Not on file   PHYSICAL EXAM  Vitals:   01/03/21 1429  BP: (!) 141/99  Pulse: (!) 116  Weight: 182 lb (82.6 kg)  Height: 5\' 6"  (1.676 m)   Body mass index is 29.38 kg/m.  Generalized: Well developed, in no acute distress   Neurological examination  Mentation: Alert oriented to time, place, history taking. Follows all commands speech and language fluent Cranial nerve II-XII: Pupils were equal round reactive to light. Extraocular movements were full, visual field were full on confrontational test. Facial sensation and strength were normal. Head turning and shoulder shrug  were normal and symmetric. Motor: The motor testing reveals 5 over 5  strength of all 4 extremities. Good symmetric motor tone is noted throughout. Slight tremor with hands outstretched. Sensory: Sensory testing is intact to soft touch on all 4 extremities. No evidence of extinction is noted.  Coordination: Cerebellar testing reveals good finger-nose-finger and heel-to-shin bilaterally.  Gait and station: Gait is normal.  Reflexes: Deep tendon reflexes are symmetric and normal bilaterally.   DIAGNOSTIC DATA (LABS, IMAGING, TESTING) - I reviewed patient records, labs, notes, testing and imaging myself where available.  Lab Results  Component Value Date   WBC 4.5 12/21/2020   HGB 12.5 12/21/2020   HCT 38.2 12/21/2020   MCV 101.2 (H) 12/21/2020   PLT 255.0 12/21/2020      Component Value Date/Time   NA 146 (H) 12/21/2020 1542   NA 144 10/03/2020 1603   K 4.6 12/21/2020 1542   CL 108 12/21/2020 1542   CO2 18 (L) 12/21/2020 1542   GLUCOSE 85 12/21/2020 1542   BUN 12 12/21/2020 1542   BUN 9 10/03/2020 1603   CREATININE 0.87 12/21/2020 1542   CREATININE 0.76 05/04/2020 0941   CALCIUM 9.2 12/21/2020 1542   PROT 7.3 12/21/2020 1542   PROT 7.1 10/03/2020 1603   ALBUMIN 4.5 12/21/2020 1542   ALBUMIN 4.6 10/03/2020 1603   AST 51 (H) 12/21/2020 1542   ALT 18 12/21/2020 1542   ALKPHOS 72 12/21/2020 1542   BILITOT 0.2 12/21/2020 1542   BILITOT <0.2 10/03/2020 1603   GFRNONAA >60 12/14/2020 1455   GFRNONAA 111 05/04/2020 0941   GFRAA 128 05/04/2020 0941   Lab Results  Component Value Date   CHOL 254 (H) 05/04/2020   HDL 122 05/04/2020   LDLCALC 112 (H) 05/04/2020   TRIG 95 05/04/2020   CHOLHDL 2.1 05/04/2020   Lab Results  Component Value Date   HGBA1C 5.1 12/21/2020   Lab Results  Component Value Date   VITAMINB12 407 12/21/2020   Lab Results  Component Value Date   TSH 2.79 12/21/2020   ASSESSMENT AND PLAN 25 y.o. year old female  has a past medical history of Anxiety, Asthma, Depression, Pyelonephritis, Seizure-like activity (HCC),  and Sepsis (HCC). here with:  1.  Epilepsy -Last seizure Dec 14, 2020, post 2 days without alcohol, felt to be in alcohol withdrawal -Rash with Lamictal, stopped, switched to Zonegran 100 mg twice daily, doing well with this, rash almost completely resolved -EEG in March 2022 was mildly abnormal, mild evidence of background slowing, no evidence of epileptiform discharge.  Could be related to her polypharmacy treatment, moderate to severe daily alcohol use. HR was elevated. -MRI of the brain with and without contrast was normal in March 2022 -Recommend establishing with psychiatry, substance abuse counselor; she isn't ready yet, seeing new PCP in 2 weeks, expects this will be discussed -She doesn't drive, no drivers license -Extensive laboratory evaluation in March 2022 (TSH, HIV, CBC, CMP, drug screen, ETOH), showed significantly elevated ETOH level 0.180 -She is working on slow alcohol cessation -HR elevated today, but is anxious, will follow overtime, need to see PCP if this is trend -Follow-up in 4 months or sooner if needed, unclear if seizure disorder, all seizures seems related to alcohol overuse or abrupt cessation  I spent 35 minutes of face-to-face and non-face-to-face time with patient.  This included previsit chart review, lab review, study review, order entry, discussing medications, history, management, and follow-up  Otila Kluver, DNP 01/03/2021, 3:33 PM [facility] [address], [address] [place], [place] [address] [phone]

## 2021-01-03 ENCOUNTER — Encounter: Payer: Self-pay | Admitting: Neurology

## 2021-01-03 ENCOUNTER — Ambulatory Visit: Payer: 59 | Admitting: Neurology

## 2021-01-03 VITALS — BP 141/99 | HR 116 | Ht 66.0 in | Wt 182.0 lb

## 2021-01-03 DIAGNOSIS — R569 Unspecified convulsions: Secondary | ICD-10-CM | POA: Diagnosis not present

## 2021-01-03 DIAGNOSIS — F10232 Alcohol dependence with withdrawal with perceptual disturbance: Secondary | ICD-10-CM | POA: Diagnosis not present

## 2021-01-03 DIAGNOSIS — F10932 Alcohol use, unspecified with withdrawal with perceptual disturbance: Secondary | ICD-10-CM

## 2021-01-03 NOTE — Patient Instructions (Signed)
Continue the Zonegran at current dosing  Call for any seizures  Recommend seeing psychiatry, considering substance treatment program  See you back in 4 months

## 2021-01-05 ENCOUNTER — Encounter: Payer: 59 | Admitting: Registered Nurse

## 2021-01-11 ENCOUNTER — Encounter: Payer: 59 | Admitting: Registered Nurse

## 2021-01-18 ENCOUNTER — Other Ambulatory Visit: Payer: Self-pay

## 2021-01-18 ENCOUNTER — Telehealth: Payer: Self-pay

## 2021-01-18 MED ORDER — METHOCARBAMOL 750 MG PO TABS: ORAL_TABLET | ORAL | 0 refills | Status: DC

## 2021-01-18 NOTE — Telephone Encounter (Signed)
PT needs refill on methocarbamol (ROBAXIN) 750 MG tablet [id number] - [place], [place] - [address]  Pt has a TOC on 07/05 with Janeece Agee   Pt call back [phone]

## 2021-01-18 NOTE — Telephone Encounter (Signed)
PT needs refill on methocarbamol (ROBAXIN) 750 MG tablet [id number] - [place], [place] - [address]  Pt has a TOC on 07/05 with Janeece Agee   Pt call back [phone]  [facility] 11/10/20 #30 with no refills LOV 12/21/20 NOV 01/24/21, TOC with Kateri Plummer

## 2021-01-18 NOTE — Telephone Encounter (Signed)
Sent to provider for approval

## 2021-01-20 ENCOUNTER — Ambulatory Visit: Payer: 59 | Admitting: Registered Nurse

## 2021-01-24 ENCOUNTER — Encounter: Payer: Self-pay | Admitting: Registered Nurse

## 2021-01-24 ENCOUNTER — Other Ambulatory Visit: Payer: Self-pay

## 2021-01-24 ENCOUNTER — Ambulatory Visit: Payer: 59 | Admitting: Registered Nurse

## 2021-01-24 ENCOUNTER — Other Ambulatory Visit (HOSPITAL_COMMUNITY): Payer: Self-pay | Admitting: Registered Nurse

## 2021-01-24 ENCOUNTER — Telehealth: Payer: Self-pay | Admitting: Registered Nurse

## 2021-01-24 VITALS — BP 127/81 | HR 116 | Temp 98.3°F | Resp 18 | Ht 66.0 in | Wt 185.2 lb

## 2021-01-24 DIAGNOSIS — R404 Transient alteration of awareness: Secondary | ICD-10-CM | POA: Insufficient documentation

## 2021-01-24 DIAGNOSIS — R440 Auditory hallucinations: Secondary | ICD-10-CM | POA: Diagnosis not present

## 2021-01-24 DIAGNOSIS — F1010 Alcohol abuse, uncomplicated: Secondary | ICD-10-CM | POA: Diagnosis not present

## 2021-01-24 DIAGNOSIS — R109 Unspecified abdominal pain: Secondary | ICD-10-CM | POA: Diagnosis not present

## 2021-01-24 DIAGNOSIS — G8929 Other chronic pain: Secondary | ICD-10-CM | POA: Diagnosis not present

## 2021-01-24 DIAGNOSIS — Z7689 Persons encountering health services in other specified circumstances: Secondary | ICD-10-CM

## 2021-01-24 DIAGNOSIS — R441 Visual hallucinations: Secondary | ICD-10-CM

## 2021-01-24 LAB — COMPREHENSIVE METABOLIC PANEL
ALT: 23 U/L (ref 0–35)
AST: 32 U/L (ref 0–37)
Albumin: 4.5 g/dL (ref 3.5–5.2)
Alkaline Phosphatase: 67 U/L (ref 39–117)
BUN: 5 mg/dL — ABNORMAL LOW (ref 6–23)
CO2: 26 mEq/L (ref 19–32)
Calcium: 8.7 mg/dL (ref 8.4–10.5)
Chloride: 107 mEq/L (ref 96–112)
Creatinine, Ser: 0.79 mg/dL (ref 0.40–1.20)
GFR: 104.88 mL/min (ref >60.00)
Glucose, Bld: 94 mg/dL (ref 70–99)
Potassium: 3.5 mEq/L (ref 3.5–5.1)
Sodium: 144 mEq/L (ref 135–145)
Total Bilirubin: 0.4 mg/dL (ref 0.2–1.2)
Total Protein: 7 g/dL (ref 6.0–8.3)

## 2021-01-24 LAB — CBC WITH DIFFERENTIAL/PLATELET
Basophils Absolute: 0.1 10*3/uL (ref 0.0–0.1)
Basophils Relative: 2.8 % (ref 0.0–3.0)
Eosinophils Absolute: 0.1 10*3/uL (ref 0.0–0.7)
Eosinophils Relative: 3.2 % (ref 0.0–5.0)
HCT: 35.1 % — ABNORMAL LOW (ref 36.0–46.0)
Hemoglobin: 11.8 g/dL — ABNORMAL LOW (ref 12.0–15.0)
Lymphocytes Relative: 56.6 % — ABNORMAL HIGH (ref 12.0–46.0)
Lymphs Abs: 2.5 10*3/uL (ref 0.7–4.0)
MCHC: 33.7 g/dL (ref 30.0–36.0)
MCV: 100.9 fl — ABNORMAL HIGH (ref 78.0–100.0)
Monocytes Absolute: 0.4 10*3/uL (ref 0.1–1.0)
Monocytes Relative: 9.6 % (ref 3.0–12.0)
Neutro Abs: 1.2 10*3/uL — ABNORMAL LOW (ref 1.4–7.7)
Neutrophils Relative %: 27.8 % — ABNORMAL LOW (ref 43.0–77.0)
Platelets: 282 10*3/uL (ref 150.0–400.0)
RBC: 3.48 Mil/uL — ABNORMAL LOW (ref 3.87–5.11)
RDW: 15.1 % (ref 11.5–15.5)
WBC: 4.4 10*3/uL (ref 4.0–10.5)

## 2021-01-24 LAB — AMYLASE: Amylase: 56 U/L (ref 27–131)

## 2021-01-24 LAB — POCT URINE PREGNANCY: Preg Test, Ur: NEGATIVE

## 2021-01-24 LAB — TSH: TSH: 1.34 u[IU]/mL (ref 0.35–5.50)

## 2021-01-24 LAB — LIPASE: Lipase: 23 U/L (ref 11.0–59.0)

## 2021-01-24 MED ORDER — NALTREXONE HCL 50 MG PO TABS: 50.0000 mg | ORAL_TABLET | Freq: Every day | ORAL | 0 refills | Status: DC

## 2021-01-24 MED ORDER — CHLORDIAZEPOXIDE HCL 25 MG PO CAPS: ORAL_CAPSULE | ORAL | 0 refills | Status: DC

## 2021-01-24 MED ORDER — ARIPIPRAZOLE 5 MG PO TABS: 5.0000 mg | ORAL_TABLET | Freq: Every day | ORAL | 0 refills | Status: DC

## 2021-01-24 NOTE — Telephone Encounter (Signed)
Patient needs her hydroxizine sent to [facility] - patient states that she only has a couple left.  Please advise

## 2021-01-24 NOTE — Progress Notes (Signed)
Established Patient Office Visit  Subjective:  Patient ID: Natasha Pope, female    DOB: [birth year]  Age: 25 y.o. MRN: [id number]  CC:  Chief Complaint  Patient presents with   Transitions Of Care    Patient states she is here for a TOC. Patient states she has been having some stomach pain for the last couple of weeks    HPI Natasha Pope presents for Natasha Pope and follow up   Seen initially by myself on 12/14/20 but unfortunately had alcohol withdrawal seizure before any meaningful treatment plan could be developed - seen in ER, noted elevated etoh, otherwise reassuring labs. Stabilized and dc'd on zonegran.  Followed up with me on 12/21/20  No known seizure activity in past month or so, but does not remember when these events happen and lives alone.   Alcohol abuse Has cut back on drinking significantly- some days down to 2 drinks daily per pt up to 7 a day max. formerly at 1-2 bottles a day per pt, more per chart, suggesitve of 5+. Has been on short course of naltrexone in the past, 64m PO qd, for substance use disorder. Tolerated well, would like to try again.  Anxiety  continuing hydroxyzine, taking 75-1064mPO qd Prn for anxiety.  Has not been taking librium - unsure she ever picked this up Continues on duloxetine 6087mO qd, remeron 79m53m qhs, and gabapentin 100mg3mqd   Auditory hallucinations/Visual hallucinations Ongoing, pt not clear on when these have started Mostly hearing conversations in other rooms, not hearing voices giving instructions or suggesting harm Sometimes seeing shapes and forms, but not seeing people or animals Happening most days Does not seem to be assoc with alcohol use or any other symptoms. No mention in past of bipolar, schizophrenia, or any other psychotic disorders.  Abdominal Pain Mostly on days when drinking less Mostly ULQ but tends to migrate Seems to happen more on days when drinking less - though grandmother notes that  this is the opposite, happens when drinking more. Comes along with much nausea and vomiting Diarrhea  Pain persists through BM  Headaches Most days Feels like band wrapped around head Not new or worsening No assoc nvd No changes to vision No neck pain  Past Medical History:  Diagnosis Date   Anxiety    Asthma    Depression    Pyelonephritis    Seizure-like activity (HCC)    Sepsis (HCC) [place] Past Surgical History:  Procedure Laterality Date   dental procedure      Family History  Problem Relation Age of Onset   Depression Mother    Miscarriages / [place]    Thyroid disease Mother    Hypertension Father    Alcohol abuse Father    Learning disabilities Sister    Hyperlipidemia Maternal Grandmother    Alcohol abuse Maternal Grandfather    Alcohol abuse Paternal Grandmother    Hypertension Paternal Grandfather     Social History   Socioeconomic History   Marital status: Single    Spouse name: Not on file   Number of children: 0   Years of education: some college   Highest education level: Not on file  Occupational History   Occupation: unemployed  Tobacco Use   Smoking status: Former    Packs/day: 0.25    Pack years: 0.00    Types: Cigarettes    Quit date: 05/10/2018    Years since quitting: 2.7   Smokeless  tobacco: Never  Vaping Use   Vaping Use: Never used  Substance and Sexual Activity   Alcohol use: Yes    Comment: 10/03/20: 16-24 ounces of liquor per day   Drug use: Not Currently    Types: Marijuana    Comment: 10/03/20: No longer using. Last use was 4 months ago.   Sexual activity: Not on file  Other Topics Concern   Not on file  Social History Narrative   Lives alone but her grandmother checks on her frequently.   Left-handed.   No daily use of caffeine.   Social Determinants of Health   Financial Resource Strain: Not on file  Food Insecurity: Not on file  Transportation Needs: Not on file  Physical Activity: Not on file   Stress: Not on file  Social Connections: Not on file  Intimate Partner Violence: Not on file    Outpatient Medications Prior to Visit  Medication Sig Dispense Refill   cetirizine (ZYRTEC) 10 MG tablet Take 1 tablet (10 mg total) by mouth daily. 365 tablet 0   DULoxetine (CYMBALTA) 60 MG capsule TAKE ONE CAPSULE BY MOUTH ONE TIME DAILY 90 capsule 0   gabapentin (NEURONTIN) 100 MG capsule TAKE ONE CAPSULE BY MOUTH TWICE DAILY 60 capsule 0   HAILEY FE 1/20 1-20 MG-MCG tablet TAKE ONE TABLET BY MOUTH ONE TIME DAILY 84 tablet 0   hydrOXYzine (ATARAX/VISTARIL) 25 MG tablet Take 1 tablet (25 mg total) by mouth 3 (three) times daily. 90 tablet 2   ibuprofen (ADVIL) 200 MG tablet Take 1-2 tablets (200-400 mg total) by mouth 3 (three) times daily as needed for headache or moderate pain. 30 tablet 0   methocarbamol (ROBAXIN) 750 MG tablet TAKE ONE TABLET BY MOUTH EVERY EIGHT HOURS AS NEEDED FOR MUSCLE SPASM 30 tablet 0   mirtazapine (REMERON) 15 MG tablet Take 1 tablet (15 mg total) by mouth at bedtime. 30 tablet 2   ondansetron (ZOFRAN-ODT) 4 MG disintegrating tablet Take 1 tablet (4 mg total) by mouth every 8 (eight) hours as needed for nausea or vomiting. 20 tablet 0   triamcinolone cream (KENALOG) 0.1 % Apply to affected area 1-2 times daily 30 g 0   zonisamide (ZONEGRAN) 100 MG capsule Take 1 capsule (100 mg total) by mouth 2 (two) times daily. 60 capsule 11   chlordiazePOXIDE (LIBRIUM) 25 MG capsule 15m PO TID x 1D, then 25-576mPO BID X 1D, then 25-5041mO QD X 1D 10 capsule 0   fluticasone (FLONASE) 50 MCG/ACT nasal spray Place 2 sprays into both nostrils daily. (Patient taking differently: Place 2 sprays into both nostrils daily as needed for allergies.) 16 g 6   naltrexone (DEPADE) 50 MG tablet TAKE ONE TABLET BY MOUTH ONE TIME DAILY 30 tablet 0   potassium chloride (KLOR-CON) 10 MEQ tablet Take 1 tablet (10 mEq total) by mouth daily. 30 tablet 0   traZODone (DESYREL) 100 MG tablet Take 2  tablets (200 mg total) by mouth at bedtime as needed for sleep. 60 tablet 3   No facility-administered medications prior to visit.    No Known Allergies  ROS Review of Systems    Objective:    Physical Exam  BP 127/81   Pulse (!) 116   Temp 98.3 F (36.8 C) (Temporal)   Resp 18   Ht _0  (1.676 m)   Wt 185 lb 3.2 oz (84 kg)   SpO2 99%   BMI 29.89 kg/m  Wt Readings from Last 3 Encounters:  01/24/21 185 lb 3.2 oz (84 kg)  01/03/21 182 lb (82.6 kg)  12/21/20 185 lb 3.2 oz (84 kg)     Health Maintenance Due  Topic Date Due   PAP-Cervical Cytology Screening  03/03/2021   PAP SMEAR-Modifier  03/03/2021    There are no preventive care reminders to display for this patient.  Lab Results  Component Value Date   TSH 2.79 12/21/2020   Lab Results  Component Value Date   WBC 4.5 12/21/2020   HGB 12.5 12/21/2020   HCT 38.2 12/21/2020   MCV 101.2 (H) 12/21/2020   PLT 255.0 12/21/2020   Lab Results  Component Value Date   NA 146 (H) 12/21/2020   K 4.6 12/21/2020   CO2 18 (L) 12/21/2020   GLUCOSE 85 12/21/2020   BUN 12 12/21/2020   CREATININE 0.87 12/21/2020   BILITOT 0.2 12/21/2020   ALKPHOS 72 12/21/2020   AST 51 (H) 12/21/2020   ALT 18 12/21/2020   PROT 7.3 12/21/2020   ALBUMIN 4.5 12/21/2020   CALCIUM 9.2 12/21/2020   ANIONGAP 15 12/14/2020   EGFR 102 10/03/2020   GFR 93.48 12/21/2020   Lab Results  Component Value Date   CHOL 254 (H) 05/04/2020   Lab Results  Component Value Date   HDL 122 05/04/2020   Lab Results  Component Value Date   LDLCALC 112 (H) 05/04/2020   Lab Results  Component Value Date   TRIG 95 05/04/2020   Lab Results  Component Value Date   CHOLHDL 2.1 05/04/2020   Lab Results  Component Value Date   HGBA1C 5.1 12/21/2020      Assessment & Plan:   Problem List Items Addressed This Visit       Other   Alcohol abuse - Primary   Relevant Medications   naltrexone (DEPADE) 50 MG tablet   chlordiazePOXIDE  (LIBRIUM) 25 MG capsule   Other Relevant Orders   CBC with Differential/Platelet   Comprehensive metabolic panel   TSH   Urine Drug Screen w/Alc, no confirm   HIV antibody (with reflex)   Auditory hallucinations   Relevant Medications   ARIPiprazole (ABILIFY) 5 MG tablet   Other Relevant Orders   HIV antibody (with reflex)   Visual hallucination   Relevant Medications   ARIPiprazole (ABILIFY) 5 MG tablet   Other Relevant Orders   HIV antibody (with reflex)   Chronic abdominal pain   Relevant Orders   CBC with Differential/Platelet   Comprehensive metabolic panel   TSH   POCT urine pregnancy   Amylase   Lipase   US Abdomen Complete   HIV antibody (with reflex)   Transient alteration of awareness   Relevant Orders   CBC with Differential/Platelet   Comprehensive metabolic panel   TSH   RPR   HIV antibody (with reflex)   Other Visit Diagnoses     Encounter to establish care       Relevant Orders   HIV antibody (with reflex)       Meds ordered this encounter  Medications   naltrexone (DEPADE) 50 MG tablet    Sig: Take 1 tablet (50 mg total) by mouth daily.    Dispense:  90 tablet    Refill:  0    Order Specific Question:   Supervising Provider    Answer:   Carlota Raspberry, JEFFREY R [[id number]]   chlordiazePOXIDE (LIBRIUM) 25 MG capsule    Sig: 86m PO TID x 1D, then 25-550mPO BID X 1D, then 25-509m  PO QD X 1D    Dispense:  10 capsule    Refill:  0    Order Specific Question:   Supervising Provider    Answer:   Carlota Raspberry, JEFFREY R [[id number]]   ARIPiprazole (ABILIFY) 5 MG tablet    Sig: Take 1 tablet (5 mg total) by mouth daily.    Dispense:  90 tablet    Refill:  0    Order Specific Question:   Supervising Provider    Answer:   Carlota Raspberry, JEFFREY R [[id number]]    Follow-up: Return in about 4 weeks (around 02/21/2021) for med check.   PLAN Abdominal pain: suspected chronic pancreatitis, will draw labs and obtain abd [place]. Continue to decrease drinking. Headaches: continue remeron.  Suggest tylenol rather than aleve. Monitor for changes. Alcohol use: will restart naltrexone. Had been on with psychiatry. No Aes at that time.  Hallucinations: alcohol use vs. Underlying mental illness. Will start abilify 50m PO qd. Discussed risks, benefits, alternatives, and side effects. Withdrawal: continue as needed librium use. Again reviewed in depth the risks of benzodiazepines. Continue other medications as prescribed. Ok to refill until next visit. Pt understands to contact office if refills are needed. POCT urine pregnancy test today in office negative, reassured that pregnancy is not cause of abdominal pain or bloating. Plan to follow up in 4-5 weeks for ongoing management and monitoring. Instructed patient to have very low threshold for sooner follow up. Virtual ok Patient encouraged to call clinic with any questions, comments, or concerns.  I spent 57 minutes with this patient discussing medication management, disease process, substance abuse management, and coordination of ongoing care.  RMaximiano Coss NP

## 2021-01-24 NOTE — Patient Instructions (Addendum)
Ms. Heaps -  I want to commend you on your continued efforts to limit how much alcohol you are consuming. I truly think that if we continue to pursue this, we can see further improvement in all symptoms.  In brief:  Anxiety Medication adjustment, see below  Headaches Medication adjustment, see below  Abdominal pain Suspect chronic pancreatitis, will draw labs and order ultrasound of abdomen. Will let you know how results look.  Auditory hallucinations  I think there may be an underlying mental health process occurring. We can try adding a low dose of abilify to see if this helps.  Alcohol We can resume naltrexone 50mg  daily. This should lower your urges to drink and make drinking feel less rewarding. Will recheck liver function tests today and in around 1 month.    Medications: Duloxetine 60mg  daily (for anxiety, depression, chronic pain) Gabapentin 100mg  twice daily (increased from previous) (for chronic pain, anxiety) Remeron 15mg  at night (for anxiety, chronic headaches) Trazodone 200mg  at night (when needed) (for sleep) Naltrexone 50mg  daily (to lower drinking) Hydroxyzine 25-100mg  up to three times daily (as needed for anxiety) Ondansetron 4mg  every 8 hours (as needed for nausea) Zonegran 100mg  two times daily (to lower seizure risk) Chlordiazepoxide 25mg  once or twice daily as needed (to lower risk of withdrawal) Aripiprazole 5mg  daily (to help with anxiety and depression, to lower frequency of auditory and visual hallucinations)   I would like to see you again in 4-6 weeks to make sure everything is going smoothly. Please reach out in the mean time if things change or get worse. If you do not want to call the office, sending a message on MyChart is a good option as well.  Thank you  Rich    If you have lab work done today you will be contacted with your lab results within the next 2 weeks.  If you have not heard from then please contact . The fastest way to  get your results is to register for My Chart.   IF you received an x-ray today, you will receive an invoice from [facility]. Please contact [facility] at [phone] with questions or concerns regarding your invoice.   IF you received labwork today, you will receive an invoice from [facility]. Please contact [facility] at [phone] with questions or concerns regarding your invoice.   Our billing staff will not be able to assist you with questions regarding bills from these companies.  You will be contacted with the lab results as soon as they are available. The fastest way to get your results is to activate your My Chart account. Instructions are located on the last page of this paperwork. If you have not heard from regarding the results in 2 weeks, please contact this office.

## 2021-01-25 ENCOUNTER — Ambulatory Visit: Payer: 59 | Admitting: Registered Nurse

## 2021-01-25 LAB — HIV ANTIBODY (ROUTINE TESTING W REFLEX): HIV 1&2 Ab, 4th Generation: NONREACTIVE

## 2021-01-25 LAB — TIQ- AMBIGUOUS ORDER: UNCLEAR ORDER:: 2130

## 2021-01-25 LAB — RPR: RPR Ser Ql: NONREACTIVE

## 2021-01-26 ENCOUNTER — Telehealth: Payer: Self-pay

## 2021-01-26 NOTE — Telephone Encounter (Signed)
Grandmother called and is staying with granddaughter for a couple of days and wants to be able to talk to [place]. [name] was prescribed ARIPiprazole (ABILIFY) 5 MG tablet  and this medication is not working for her she acting aggressive and acting out grandmother is real concered and she said she want be able to stay much longer. And really needs to talk with Gerlene Burdock about this.   Call back [phone] Stevphen [place]

## 2021-01-26 NOTE — Telephone Encounter (Signed)
Heading out the door at the moment but would recommend pt stop taking medication and see if we can get virtual visit and connect with both grandmother and pt to talk through these symptoms - we can make sure everyone joins even remotely.  Thank you  Rich

## 2021-01-27 NOTE — Telephone Encounter (Signed)
LVM for patient to call and schedule virtual visit.

## 2021-01-30 ENCOUNTER — Encounter: Payer: Self-pay | Admitting: Registered Nurse

## 2021-01-30 ENCOUNTER — Telehealth (INDEPENDENT_AMBULATORY_CARE_PROVIDER_SITE_OTHER): Payer: 59 | Admitting: Registered Nurse

## 2021-01-30 ENCOUNTER — Other Ambulatory Visit: Payer: Self-pay

## 2021-01-30 DIAGNOSIS — R569 Unspecified convulsions: Secondary | ICD-10-CM

## 2021-01-30 DIAGNOSIS — F12959 Cannabis use, unspecified with psychotic disorder, unspecified: Secondary | ICD-10-CM

## 2021-01-30 DIAGNOSIS — F10231 Alcohol dependence with withdrawal delirium: Secondary | ICD-10-CM | POA: Diagnosis not present

## 2021-01-30 DIAGNOSIS — F10931 Alcohol use, unspecified with withdrawal delirium: Secondary | ICD-10-CM

## 2021-01-30 NOTE — Patient Instructions (Signed)
° ° ° °  If you have lab work done today you will be contacted with your lab results within the next 2 weeks.  If you have not heard from us then please contact us. The fastest way to get your results is to register for My Chart. ° ° °IF you received an x-ray today, you will receive an invoice from [facility]. Please contact [facility] at [phone] with questions or concerns regarding your invoice.  ° °IF you received labwork today, you will receive an invoice from [facility]. Please contact [facility] at [phone] with questions or concerns regarding your invoice.  ° °Our billing staff will not be able to assist you with questions regarding bills from these companies. ° °You will be contacted with the lab results as soon as they are available. The fastest way to get your results is to activate your My Chart account. Instructions are located on the last page of this paperwork. If you have not heard from us regarding the results in 2 weeks, please contact this office. °  ° ° ° °

## 2021-01-30 NOTE — Progress Notes (Signed)
Telemedicine Encounter- SOAP NOTE Established Patient  This telephone encounter was conducted with the patient's (or proxy's) verbal consent via audio telecommunications: yes  Patient was instructed to have this encounter in a suitably private space; and to only have persons present to whom they give permission to participate. In addition, patient identity was confirmed by use of name plus two identifiers (DOB and address).  I discussed the limitations, risks, security and privacy concerns of performing an evaluation and management service by telephone and the availability of in person appointments. I also discussed with the patient that there may be a patient responsible charge related to this service. The patient expressed understanding and agreed to proceed.  I spent a total of 15 minutes talking with the patient or their proxy.  Patient at home Provider in office  Participants: [name], NP and [name] [facility]  Chief Complaint  Patient presents with   Medication Problem    Patient states she is having some concerns about her medications.     Subjective   Natasha Pope is a 25 y.o. established patient. Telephone visit today for medication concerns  HPI We had started her on a low dose of abilify at 5mg  PO qd last week for her ongoing depression and anxiety not responding well to her daily cymbalta. She unfortunately became more aggressive and labile with this medication.  Per grandmother, : concerned that abilify will interact with alcohol. Also concerned that pt had had 1.75L/59oz of liquor between two days last week.  Notes that pt is not taking trazodone or mirtazapine at this time - "make her feel funny" Did note that when taking one of the medications, was very labile in her mood. She is concerned about underlying mental illness and facing consequences of excessive alcohol use.   Per patient: notes agitation with some medications -  thinks to be abilify Does note that she has cut down on alcohol quite a bit. 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   Delusional disorder (HCC)    Cocaine use disorder, moderate, in early remission (HCC) 08/13/2019   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/2019   Alcohol intoxication with moderate or severe use disorder (HCC) 01/16/2019   Alcohol withdrawal (HCC) 01/10/2019   Alcohol use disorder, severe, in early remission, dependence (HCC) 12/27/2018   Recurrent major depressive disorder (HCC) 12/27/2018   Seizure (HCC) 12/26/2018   Alcohol use with alcohol-induced mood disorder (HCC)    Substance induced mood disorder (HCC) 12/09/2018   Alcohol abuse 12/09/2018   Macrocytic anemia 05/19/2018   Thrombocytopenia (HCC) 05/19/2018   ASCUS of cervix with negative high risk HPV 03/07/2018   Tobacco abuse 01/31/2018   Asthma 01/31/2018   GAD (generalized anxiety disorder) 01/31/2018    Past Medical History:  Diagnosis Date   Anxiety    Asthma    Depression    Pyelonephritis    Seizure-like activity (HCC)    Sepsis (HCC)     Current Outpatient Medications  Medication Sig Dispense Refill   cetirizine (ZYRTEC) 10 MG tablet Take  1 tablet (10 mg total) by mouth daily. 365 tablet 0   naltrexone (DEPADE) 50 MG tablet Take 1 tablet (50 mg total) by mouth daily. 90 tablet 0   triamcinolone cream (KENALOG) 0.1 % Apply to affected area 1-2 times daily 30 g 0   zonisamide (ZONEGRAN) 100 MG capsule Take 1 capsule (100 mg total) by mouth  2 (two) times daily. 60 capsule 11   ARIPiprazole (ABILIFY) 5 MG tablet TAKE ONE TABLET BY MOUTH ONE TIME DAILY 90 tablet 0   norethindrone-ethinyl estradiol-FE (HAILEY FE 1/20) 1-20 MG-MCG tablet Take 1 tablet by mouth daily. 84 tablet 4   ondansetron (ZOFRAN-ODT) 4 MG disintegrating tablet Take 1 tablet (4 mg total) by mouth every 8 (eight) hours as needed for nausea or vomiting. 20 tablet 0   potassium chloride (KLOR-CON) 10 MEQ tablet Take 1 tablet (10 mEq total) by mouth daily. 30 tablet 0   No current facility-administered medications for this visit.    Allergies  Allergen Reactions   Lamotrigine Hives    Social History   Socioeconomic History   Marital status: Single    Spouse name: Not on file   Number of children: 0   Years of education: some college   Highest education level: High school graduate  Occupational History   Occupation: unemployed    Comment: Works for [employer] in [employer], environmental business  Tobacco Use   Smoking status: Former    Packs/day: 0.25    Types: Cigarettes    Quit date: 05/10/2018    Years since quitting: 2.9   Smokeless tobacco: Never  Vaping Use   Vaping Use: Never used  Substance and Sexual Activity   Alcohol use: Yes    Comment: 02/17/2021 7-9 2-3 oz pours of liquor per day   Drug use: Not Currently    Types: Marijuana    Comment: 10/03/20: No longer using. Last use was 4 months ago.   Sexual activity: Not Currently    Birth control/protection: Pill  Other Topics Concern   Not on file  Social History Narrative   Living alone in [facility]. No roommate. Grandmother who lives in [place] checks on her frequently.    Left-handed.   No daily use of caffeine.   Social Determinants of Health   Financial Resource Strain: Not on file  Food Insecurity: Not on file  Transportation Needs: Not on file  Physical Activity: Not on file  Stress: Not on file  Social Connections: Not on file  Intimate Partner Violence: Not on file    Review of  Systems  Constitutional: Negative.   HENT: Negative.    Eyes: Negative.   Respiratory: Negative.    Cardiovascular: Negative.   Gastrointestinal: Negative.   Genitourinary: Negative.   Musculoskeletal: Negative.   Skin: Negative.   Neurological: Negative.   Endo/Heme/Allergies: Negative.   Psychiatric/Behavioral: Negative.    All other systems reviewed and are negative.  Objective   Vitals as reported by the patient: There were no vitals filed for this visit.  [name] was seen today for medication problem.  Diagnoses and all orders for this visit:  Alcohol withdrawal delirium (HCC)  Seizures (HCC)  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HCC)   PLAN Reviewed in depth risks, benefits, side effects, and adverse effects of medications with patient and guardian. They have voiced understanding. Will continue current course Return in 4-5 weeks for med check Patient encouraged to call clinic with any questions, comments, or concerns.  I discussed the assessment and treatment  plan with the patient. The patient was provided an opportunity to ask questions and all were answered. The patient agreed with the plan and demonstrated an understanding of the instructions.   The patient was advised to call back or seek an in-person evaluation if the symptoms worsen or if the condition fails to improve as anticipated.  I provided 42 minutes of non-face-to-face time during this encounter.  Janeece Agee, NP

## 2021-02-10 ENCOUNTER — Encounter: Payer: Self-pay | Admitting: Registered Nurse

## 2021-02-13 ENCOUNTER — Encounter: Payer: Self-pay | Admitting: Registered Nurse

## 2021-02-13 ENCOUNTER — Telehealth (INDEPENDENT_AMBULATORY_CARE_PROVIDER_SITE_OTHER): Payer: 59 | Admitting: Registered Nurse

## 2021-02-13 ENCOUNTER — Other Ambulatory Visit: Payer: Self-pay

## 2021-02-13 DIAGNOSIS — F4010 Social phobia, unspecified: Secondary | ICD-10-CM | POA: Diagnosis not present

## 2021-02-13 DIAGNOSIS — K529 Noninfective gastroenteritis and colitis, unspecified: Secondary | ICD-10-CM | POA: Diagnosis not present

## 2021-02-13 DIAGNOSIS — Z3009 Encounter for other general counseling and advice on contraception: Secondary | ICD-10-CM | POA: Diagnosis not present

## 2021-02-13 DIAGNOSIS — F330 Major depressive disorder, recurrent, mild: Secondary | ICD-10-CM

## 2021-02-13 MED ORDER — BUPROPION HCL ER (SR) 150 MG PO TB12: 150.0000 mg | ORAL_TABLET | Freq: Two times a day (BID) | ORAL | 0 refills | Status: DC

## 2021-02-13 MED ORDER — CLONAZEPAM 0.5 MG PO TABS: 0.5000 mg | ORAL_TABLET | Freq: Two times a day (BID) | ORAL | 1 refills | Status: DC | PRN

## 2021-02-13 MED ORDER — ONDANSETRON 4 MG PO TBDP: 4.0000 mg | ORAL_TABLET | Freq: Three times a day (TID) | ORAL | 0 refills | Status: DC | PRN

## 2021-02-13 MED ORDER — NORETHIN ACE-ETH ESTRAD-FE 1-20 MG-MCG PO TABS: 1.0000 | ORAL_TABLET | Freq: Every day | ORAL | 4 refills | Status: DC

## 2021-02-13 NOTE — Progress Notes (Signed)
Telemedicine Encounter- SOAP NOTE Established Patient  This tvideo encounter was conducted with the patient's (or proxy's) verbal consent via audio telecommunications: yes/no: Yes Patient was instructed to have this encounter in a suitably private space; and to only have persons present to whom they give permission to participate. In addition, patient identity was confirmed by use of name plus two identifiers (DOB and address).  I discussed the limitations, risks, security and privacy concerns of performing an evaluation and management service by telephone and the availability of in person appointments. I also discussed with the patient that there may be a patient responsible charge related to this service. The patient expressed understanding and agreed to proceed.  I spent a total of 52 minutes talking with the patient or their proxy.  Patient at home Provider in office  Participants: Natasha Sportsman, NP and Natasha Pope [facility] and her grandmother, Natasha Pope  Chief Complaint  Patient presents with   Depression    Patient states she is having some Depression she has not had any motivation to even get up and take a shower [id number]. Patient states she has not had a drink in about 5 days and is currently having some withdraws and vomiting anf having problems just sweating. She would like a prescription to try to give her some energy and has a interview and really just want to be together for the appointment.    Subjective   Natasha Pope is a 25 y.o. established patient. Telephone visit today for Depression  HPI Depression - feeling down, no motivation, having a tough time with self care like hygiene. 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  She has been going on for about 5 days almost entirely without alcohol. Some withdrawal symptoms including nausea and vomiting. Seem to be improving. Notes 1-2  drinks daily but very limited.   Notes decreased visual acuity. Formerly very good vision. This is concerning to her. Has been ongoing for a while but unsure how long due to her substance use.   Rash - ongoing for a few weeks. Started at face/neck, then moved down to chest and stomach, now inner upper legs. Drastically improving. Had started after starting lamictal, which she is no longer on.   Out of breath easily, rapid heart rate, mostly in public. Thinks this is related to anxiety. Some chest tightness but no pain. She is thinking this is going to improve as she gets further from withdrawal.  Some concern for weight gain. States she is at a weight she didn't think she'd ever be at. Working on being more active and eating healthy.  Currently NOT taking these medications on her list: Mirtazapine 15mg  PO qhs - taking most nights - skips some nights, makes her "feel weird" Librium 25mg  PO taper per instructions: does not take, gets "scared" Trazodone - 200mg  PO qhs - no longer taking Aripiprazole 5mg  PO qd - had stopped taking - agitation  Currently taking these medications: Gabapentin 100mg  PO bid - taking most days Duloxetine 60mg  PO qd - taking daily Naltrexone 50mg  PO qd - taking daily Methocarbamol 750 PO tid PRN - taking daily COC - 1 daily - needs refill  Patient Active Problem List   Diagnosis Date Noted   Auditory hallucinations 01/24/2021   Visual hallucination 01/24/2021   Chronic abdominal pain 01/24/2021   Transient alteration of awareness 01/24/2021   Seizures (HCC) 10/03/2020   Depression 10/03/2020  Alcohol use 10/03/2020   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   Delusional disorder (HCC)    Cocaine use disorder, moderate, in early remission (HCC) 08/13/2019   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/2019   Alcohol intoxication with moderate or severe use disorder (HCC) 01/16/2019   Alcohol withdrawal (HCC) 01/10/2019   Alcohol use disorder, severe,  in early remission, dependence (HCC) 12/27/2018   Recurrent major depressive disorder (HCC) 12/27/2018   Seizure (HCC) 12/26/2018   Alcohol use with alcohol-induced mood disorder (HCC)    Substance induced mood disorder (HCC) 12/09/2018   Alcohol abuse 12/09/2018   Macrocytic anemia 05/19/2018   Thrombocytopenia (HCC) 05/19/2018   ASCUS of cervix with negative high risk HPV 03/07/2018   Tobacco abuse 01/31/2018   Asthma 01/31/2018   GAD (generalized anxiety disorder) 01/31/2018    Past Medical History:  Diagnosis Date   Anxiety    Asthma    Depression    Pyelonephritis    Seizure-like activity (HCC)    Sepsis (HCC)     Current Outpatient Medications  Medication Sig Dispense Refill   buPROPion (WELLBUTRIN SR) 150 MG 12 hr tablet Take 1 tablet (150 mg total) by mouth 2 (two) times daily. 180 tablet 0   cetirizine (ZYRTEC) 10 MG tablet Take 1 tablet (10 mg total) by mouth daily. 365 tablet 0   clonazePAM (KLONOPIN) 0.5 MG tablet Take 1 tablet (0.5 mg total) by mouth 2 (two) times daily as needed for anxiety. 20 tablet 1   DULoxetine (CYMBALTA) 60 MG capsule TAKE ONE CAPSULE BY MOUTH ONE TIME DAILY 90 capsule 0   gabapentin (NEURONTIN) 100 MG capsule TAKE ONE CAPSULE BY MOUTH TWICE DAILY 60 capsule 0   hydrOXYzine (ATARAX/VISTARIL) 25 MG tablet TAKE ONE TABLET BY MOUTH THREE TIMES DAILY 90 tablet 0   ibuprofen (ADVIL) 200 MG tablet Take 1-2 tablets (200-400 mg total) by mouth 3 (three) times daily as needed for headache or moderate pain. 30 tablet 0   methocarbamol (ROBAXIN) 750 MG tablet TAKE ONE TABLET BY MOUTH EVERY EIGHT HOURS AS NEEDED FOR MUSCLE SPASM 30 tablet 0   naltrexone (DEPADE) 50 MG tablet Take 1 tablet (50 mg total) by mouth daily. 90 tablet 0   triamcinolone cream (KENALOG) 0.1 % Apply to affected area 1-2 times daily 30 g 0   zonisamide (ZONEGRAN) 100 MG capsule Take 1 capsule (100 mg total) by mouth 2 (two) times daily. 60 capsule 11   norethindrone-ethinyl  estradiol-FE (HAILEY FE 1/20) 1-20 MG-MCG tablet Take 1 tablet by mouth daily. 84 tablet 4   ondansetron (ZOFRAN-ODT) 4 MG disintegrating tablet Take 1 tablet (4 mg total) by mouth every 8 (eight) hours as needed for nausea or vomiting. 20 tablet 0   potassium chloride (KLOR-CON) 10 MEQ tablet Take 1 tablet (10 mEq total) by mouth daily. 30 tablet 0   No current facility-administered medications for this visit.    Allergies  Allergen Reactions   Lamotrigine Hives    Social History   Socioeconomic History   Marital status: Single    Spouse name: Not on file   Number of children: 0   Years of education: some college   Highest education level: Not on file  Occupational History   Occupation: unemployed  Tobacco Use   Smoking status: Former    Packs/day: 0.25    Types: Cigarettes    Quit date: 05/10/2018    Years since quitting: 2.7   Smokeless tobacco: Never  Vaping Use  Vaping Use: Never used  Substance and Sexual Activity   Alcohol use: Yes    Comment: 10/03/20: 16-24 ounces of liquor per day   Drug use: Not Currently    Types: Marijuana    Comment: 10/03/20: No longer using. Last use was 4 months ago.   Sexual activity: Not on file  Other Topics Concern   Not on file  Social History Narrative   Lives alone but her grandmother checks on her frequently.   Left-handed.   No daily use of caffeine.   Social Determinants of Health   Financial Resource Strain: Not on file  Food Insecurity: Not on file  Transportation Needs: Not on file  Physical Activity: Not on file  Stress: Not on file  Social Connections: Not on file  Intimate Partner Violence: Not on file    ROS Per hpi   Objective   Vitals as reported by the patient: There were no vitals filed for this visit.  [name] was seen today for depression.  Diagnoses and all orders for this visit:  Birth control counseling -     norethindrone-ethinyl estradiol-FE (HAILEY FE 1/20) 1-20 MG-MCG tablet; Take 1  tablet by mouth daily.  Mild episode of recurrent major depressive disorder (HCC) -     buPROPion (WELLBUTRIN SR) 150 MG 12 hr tablet; Take 1 tablet (150 mg total) by mouth 2 (two) times daily.  Social anxiety disorder -     clonazePAM (KLONOPIN) 0.5 MG tablet; Take 1 tablet (0.5 mg total) by mouth 2 (two) times daily as needed for anxiety.  Gastroenteritis -     ondansetron (ZOFRAN-ODT) 4 MG disintegrating tablet; Take 1 tablet (4 mg total) by mouth every 8 (eight) hours as needed for nausea or vomiting.  PLAN Refill COC as above. Restart wellbutrin for depression as she is not taking mirtazapine. Reviewed risks/benefits with patient who voices understanding. Start clonazepam 0.5mg  po bid prn for anxiety. Reviewed risks of this medication. While she has used substances recreationally in past denies any addiction or misuse of benzodiazepines. While she did not do well with librium I think she will do better with this low dose clonazepam in the short term. Refill zofran for nvd assoc with etoh withdrawal Will plan to follow up in 4 weeks for med check She has made great progress and I am optimistic that she will continue to improve Patient encouraged to call clinic with any questions, comments, or concerns.   I discussed the assessment and treatment plan with the patient. The patient was provided an opportunity to ask questions and all were answered. The patient agreed with the plan and demonstrated an understanding of the instructions.   The patient was advised to call back or seek an in-person evaluation if the symptoms worsen or if the condition fails to improve as anticipated.  I provided 52 minutes of non-face-to-face time during this encounter.  Janeece Agee, NP

## 2021-02-13 NOTE — Patient Instructions (Signed)
° ° ° °  If you have lab work done today you will be contacted with your lab results within the next 2 weeks.  If you have not heard from us then please contact us. The fastest way to get your results is to register for My Chart. ° ° °IF you received an x-ray today, you will receive an invoice from [facility]. Please contact [facility] at [phone] with questions or concerns regarding your invoice.  ° °IF you received labwork today, you will receive an invoice from [facility]. Please contact [facility] at [phone] with questions or concerns regarding your invoice.  ° °Our billing staff will not be able to assist you with questions regarding bills from these companies. ° °You will be contacted with the lab results as soon as they are available. The fastest way to get your results is to activate your My Chart account. Instructions are located on the last page of this paperwork. If you have not heard from us regarding the results in 2 weeks, please contact this office. °  ° ° ° °

## 2021-02-17 ENCOUNTER — Other Ambulatory Visit: Payer: Self-pay

## 2021-02-17 ENCOUNTER — Encounter: Payer: Self-pay | Admitting: Behavioral Health

## 2021-02-17 ENCOUNTER — Ambulatory Visit (INDEPENDENT_AMBULATORY_CARE_PROVIDER_SITE_OTHER): Payer: 59 | Admitting: Behavioral Health

## 2021-02-17 VITALS — BP 162/100 | HR 86 | Ht 66.0 in | Wt 173.0 lb

## 2021-02-17 DIAGNOSIS — F1021 Alcohol dependence, in remission: Secondary | ICD-10-CM | POA: Diagnosis not present

## 2021-02-17 DIAGNOSIS — F1020 Alcohol dependence, uncomplicated: Secondary | ICD-10-CM | POA: Diagnosis not present

## 2021-02-17 DIAGNOSIS — F331 Major depressive disorder, recurrent, moderate: Secondary | ICD-10-CM

## 2021-02-17 DIAGNOSIS — F411 Generalized anxiety disorder: Secondary | ICD-10-CM | POA: Diagnosis not present

## 2021-02-17 NOTE — Progress Notes (Signed)
[facility] MD/PA/NP Initial Note  02/17/2021 3:47 PM Natasha Pope  MRN:  [id number]  Chief Complaint:  Chief Complaint   Alcohol Problem; Anxiety; Depression; Establish Care; Medication Problem     HPI:  Source: Self and information not entirely reliable at this time.  "Natasha Pope", 25 year old female presents to this office for initial visit and to establish care. Her gait is unstable, speech slow and slurred and I detect a faint odor of alcohol about her person. Her pupils are dilated. She says that she is not sure why she is here but says, "I feel depressed and I think I need help". 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 She begins to cry and says it started when her Dad was unfaithful to her mom during that time. She says that she has no energy, no drive or motivation. She is unsure how much medication she has taken. Says she did not drive to appointment but she Ubered here from [employer]. She denies mania. No psychosis present. She denies auditory or visual hallucinations. She said she did not have any suicidal ideations and says, "I just want to have a better life and feel better: Denies HI. She contracted verbally for safety. I do not feel she was so grossly intoxicated that it impairs total judgement or immediate concerns for safety. See Recommendations below.   Past psychiatric medication trials: Unknown at this time. Attempt at F/U.    Visit Diagnosis: No diagnosis found.  Past Psychiatric History: ETOH, Depression, Anxiety.   Past Medical History:  Past Medical History:  Diagnosis Date   Anxiety    Asthma    Depression    Pyelonephritis    Seizure-like activity (HCC)    Sepsis (HCC)     Past Surgical History:  Procedure Laterality Date   dental procedure      Family Psychiatric History: see chart  Family History:  Family History  Problem Relation Age of Onset   Depression Mother    Miscarriages / [place]  Mother    Thyroid disease Mother    Hypertension Father    Alcohol abuse Father    Learning disabilities Sister    Hyperlipidemia Maternal Grandmother    Alcohol abuse Maternal Grandfather    Alcohol abuse Paternal Grandmother    Hypertension Paternal Grandfather     Social History:  Social History   Socioeconomic History   Marital status: Single    Spouse name: Not on file   Number of children: 0   Years of education: some college   Highest education level: High school graduate  Occupational History   Occupation: unemployed    Comment: Works for [employer] in [employer], environmental business  Tobacco Use   Smoking status: Former    Packs/day: 0.25    Types: Cigarettes    Quit date: 05/10/2018    Years since quitting: 2.7   Smokeless tobacco: Never  Vaping Use   Vaping Use: Never used  Substance and Sexual Activity   Alcohol use: Yes    Comment: 02/17/2021 7-9 2-3 oz pours of liquor per day   Drug use: Not Currently    Types: Marijuana    Comment: 10/03/20: No longer using. Last use was 4 months ago.   Sexual activity: Not Currently    Birth control/protection: Pill  Other Topics Concern   Not on file  Social History Narrative   Living alone in [facility]. No roommate. Grandmother who lives in [place] checks  on her frequently.    Left-handed.   No daily use of caffeine.   Social Determinants of Health   Financial Resource Strain: Not on file  Food Insecurity: Not on file  Transportation Needs: Not on file  Physical Activity: Not on file  Stress: Not on file  Social Connections: Not on file    Allergies:  Allergies  Allergen Reactions   Lamotrigine Hives    Metabolic Disorder Labs: Lab Results  Component Value Date   HGBA1C 5.1 12/21/2020   No results found for: PROLACTIN Lab Results  Component Value Date   CHOL 254 (H) 05/04/2020   TRIG 95 05/04/2020   HDL 122 05/04/2020   CHOLHDL 2.1 05/04/2020   VLDL 44.0 01/31/2018   LDLCALC 112 (H) 05/04/2020   LDLCALC  58 01/31/2018   Lab Results  Component Value Date   TSH 1.34 01/24/2021   TSH 2.79 12/21/2020    Therapeutic Level Labs: No results found for: LITHIUM No results found for: VALPROATE No components found for:  CBMZ  Current Medications: Current Outpatient Medications  Medication Sig Dispense Refill   buPROPion (WELLBUTRIN SR) 150 MG 12 hr tablet Take 1 tablet (150 mg total) by mouth 2 (two) times daily. 180 tablet 0   cetirizine (ZYRTEC) 10 MG tablet Take 1 tablet (10 mg total) by mouth daily. 365 tablet 0   clonazePAM (KLONOPIN) 0.5 MG tablet Take 1 tablet (0.5 mg total) by mouth 2 (two) times daily as needed for anxiety. 20 tablet 1   DULoxetine (CYMBALTA) 60 MG capsule TAKE ONE CAPSULE BY MOUTH ONE TIME DAILY 90 capsule 0   hydrOXYzine (ATARAX/VISTARIL) 25 MG tablet TAKE ONE TABLET BY MOUTH THREE TIMES DAILY 90 tablet 0   ibuprofen (ADVIL) 200 MG tablet Take 1-2 tablets (200-400 mg total) by mouth 3 (three) times daily as needed for headache or moderate pain. 30 tablet 0   methocarbamol (ROBAXIN) 750 MG tablet TAKE ONE TABLET BY MOUTH EVERY EIGHT HOURS AS NEEDED FOR MUSCLE SPASM 30 tablet 0   naltrexone (DEPADE) 50 MG tablet Take 1 tablet (50 mg total) by mouth daily. 90 tablet 0   norethindrone-ethinyl estradiol-FE (HAILEY FE 1/20) 1-20 MG-MCG tablet Take 1 tablet by mouth daily. 84 tablet 4   ondansetron (ZOFRAN-ODT) 4 MG disintegrating tablet Take 1 tablet (4 mg total) by mouth every 8 (eight) hours as needed for nausea or vomiting. 20 tablet 0   triamcinolone cream (KENALOG) 0.1 % Apply to affected area 1-2 times daily 30 g 0   zonisamide (ZONEGRAN) 100 MG capsule Take 1 capsule (100 mg total) by mouth 2 (two) times daily. 60 capsule 11   gabapentin (NEURONTIN) 100 MG capsule TAKE ONE CAPSULE BY MOUTH TWICE DAILY (Patient not taking: Reported on 02/17/2021) 60 capsule 0   potassium chloride (KLOR-CON) 10 MEQ tablet Take 1 tablet (10 mEq total) by mouth daily. 30 tablet 0   No  current facility-administered medications for this visit.    Medication Side Effects: none  Orders placed this visit:  No orders of the defined types were placed in this encounter.   Psychiatric Specialty Exam:  Review of Systems  Respiratory:  Positive for shortness of breath.   Cardiovascular:  Positive for chest pain.  Endocrine: Positive for heat intolerance.  Neurological:  Positive for dizziness, weakness, numbness and headaches.   Blood pressure (!) 162/100, pulse 86, height 5\' 6"  (1.676 m), weight 173 lb (78.5 kg).Body mass index is 27.92 kg/m.  General Appearance: Casual  Eye  Contact:  Fair  Speech:  Slurred  Volume:  Decreased  Mood:  Depressed  Affect:  Depressed, Restricted, and Tearful  Thought Process:  Disorganized  Orientation:  Other:  intoxicated  Thought Content: Logical   Suicidal Thoughts:  No  Homicidal Thoughts:  No  Memory:  Remote;   Fair  Judgement:  Impaired  Insight:  Fair  Psychomotor Activity:  Decreased  Concentration:  Concentration: Fair  Recall:  [employer] of Knowledge: Fair  Language: Poor  Assets:  Desire for Improvement Physical Health Resilience Social Support  ADL's:  Intact  Cognition: Impaired,  Mild  Prognosis:  Fair   Screenings:  GAD-7    Flowsheet Row Office Visit from 01/24/2021 in [facility] Office Visit from 12/24/2018 in [facility] from 11/17/2018 in [facility] from 03/03/2018 in [facility] from 01/31/2018 in [facility]  Total GAD-7 Score 11 14 5 2 9       PHQ2-9    Flowsheet Row Video Visit from 02/13/2021 in [facility] Office Visit from 01/24/2021 in [facility] Office Visit from 12/21/2020 in [facility] Office Visit from  05/04/2020 in [facility] ED from 01/10/2020 in [facility]  PHQ-2 Total Score 6 3 0 0 2  PHQ-9 Total Score 19 14 -- -- 9      Flowsheet Row ED from 12/14/2020 in [facility] ED from 05/02/2019 in [facility] ED to Hosp-Admission (Discharged) from 01/10/2019 in [facility] 4TH FLOOR [facility]GR[facility] UROLOGY  C-SSRS RISK CATEGORY No Risk No Risk Error: Q3, 4, or 5 should not be populated when Q2 is No       Receiving Psychotherapy: No   Treatment Plan/Recommendations:   Will continue on current medication regimen until 02/21/2021. Greater than 50% of 45 min face to face time with patient was spent on counseling and coordination of care. We discussed some of her history of depression. However after initial observation and assessment, I determined that pt was under the influence of some intoxicating substance. I suspected that it was alcohol or alcohol in combination with some of her currently prescribed medications. I did not feel safe making further recommendations for medication while patient is impaired. Although I felt she was able to process some information, I felt it in her best interest to reschedule a work-in appt for 8/2. She says that she is interested in getting help but would not under any circumstances consider a rehab facility that is inpatient at this time. She was not honest with me about previous problems with cocaine in the past. I expressed my concerns for her safety especially living alone in the extended stay. She reassured me that she felt safe. I requested that she not drink anything night before next f/u or day of to more accurately assess her medication and ability to comply. She is seeing her PCP on regular basis. I told her I would assist in finding in available outpatient program but it would take her being ready to get help.    Emergency contact and  after hours number was provided. Reviewed PMDD    04/23/2021, NP

## 2021-02-21 ENCOUNTER — Ambulatory Visit: Payer: 59 | Admitting: Registered Nurse

## 2021-02-21 ENCOUNTER — Ambulatory Visit: Payer: 59 | Admitting: Behavioral Health

## 2021-02-27 ENCOUNTER — Other Ambulatory Visit: Payer: Self-pay

## 2021-02-27 ENCOUNTER — Inpatient Hospital Stay (HOSPITAL_COMMUNITY)
Admission: EM | Admit: 2021-02-27 | Discharge: 2021-03-02 | DRG: 897 | Disposition: A | Discharge status: Home / Self Care | Payer: 59 | Attending: Internal Medicine | Admitting: Internal Medicine

## 2021-02-27 ENCOUNTER — Emergency Department (HOSPITAL_COMMUNITY): Payer: 59

## 2021-02-27 ENCOUNTER — Encounter (HOSPITAL_COMMUNITY): Payer: Self-pay

## 2021-02-27 DIAGNOSIS — R7989 Other specified abnormal findings of blood chemistry: Secondary | ICD-10-CM | POA: Diagnosis present

## 2021-02-27 DIAGNOSIS — F419 Anxiety disorder, unspecified: Secondary | ICD-10-CM | POA: Diagnosis present

## 2021-02-27 DIAGNOSIS — F10231 Alcohol dependence with withdrawal delirium: Principal | ICD-10-CM | POA: Diagnosis present

## 2021-02-27 DIAGNOSIS — F32A Depression, unspecified: Secondary | ICD-10-CM | POA: Diagnosis present

## 2021-02-27 DIAGNOSIS — Z20822 Contact with and (suspected) exposure to covid-19: Secondary | ICD-10-CM | POA: Diagnosis present

## 2021-02-27 DIAGNOSIS — Z79899 Other long term (current) drug therapy: Secondary | ICD-10-CM

## 2021-02-27 DIAGNOSIS — F10939 Alcohol use, unspecified with withdrawal, unspecified: Secondary | ICD-10-CM | POA: Diagnosis present

## 2021-02-27 DIAGNOSIS — R569 Unspecified convulsions: Secondary | ICD-10-CM

## 2021-02-27 DIAGNOSIS — W1830XA Fall on same level, unspecified, initial encounter: Secondary | ICD-10-CM | POA: Diagnosis present

## 2021-02-27 DIAGNOSIS — S0181XA Laceration without foreign body of other part of head, initial encounter: Secondary | ICD-10-CM | POA: Diagnosis present

## 2021-02-27 DIAGNOSIS — E872 Acidosis: Secondary | ICD-10-CM | POA: Diagnosis present

## 2021-02-27 DIAGNOSIS — F10232 Alcohol dependence with withdrawal with perceptual disturbance: Secondary | ICD-10-CM | POA: Diagnosis present

## 2021-02-27 DIAGNOSIS — Y92239 Unspecified place in hospital as the place of occurrence of the external cause: Secondary | ICD-10-CM | POA: Diagnosis present

## 2021-02-27 DIAGNOSIS — Z8659 Personal history of other mental and behavioral disorders: Secondary | ICD-10-CM

## 2021-02-27 DIAGNOSIS — J45909 Unspecified asthma, uncomplicated: Secondary | ICD-10-CM | POA: Diagnosis present

## 2021-02-27 DIAGNOSIS — E861 Hypovolemia: Secondary | ICD-10-CM | POA: Diagnosis present

## 2021-02-27 DIAGNOSIS — F10931 Alcohol use, unspecified with withdrawal delirium: Secondary | ICD-10-CM

## 2021-02-27 DIAGNOSIS — R17 Unspecified jaundice: Secondary | ICD-10-CM | POA: Diagnosis present

## 2021-02-27 DIAGNOSIS — G40909 Epilepsy, unspecified, not intractable, without status epilepticus: Secondary | ICD-10-CM | POA: Diagnosis present

## 2021-02-27 DIAGNOSIS — R441 Visual hallucinations: Secondary | ICD-10-CM | POA: Diagnosis present

## 2021-02-27 DIAGNOSIS — Z818 Family history of other mental and behavioral disorders: Secondary | ICD-10-CM

## 2021-02-27 DIAGNOSIS — Z23 Encounter for immunization: Secondary | ICD-10-CM

## 2021-02-27 DIAGNOSIS — Z87891 Personal history of nicotine dependence: Secondary | ICD-10-CM

## 2021-02-27 DIAGNOSIS — E871 Hypo-osmolality and hyponatremia: Secondary | ICD-10-CM | POA: Diagnosis present

## 2021-02-27 DIAGNOSIS — S01112A Laceration without foreign body of left eyelid and periocular area, initial encounter: Secondary | ICD-10-CM | POA: Diagnosis present

## 2021-02-27 DIAGNOSIS — Z888 Allergy status to other drugs, medicaments and biological substances status: Secondary | ICD-10-CM

## 2021-02-27 DIAGNOSIS — E876 Hypokalemia: Secondary | ICD-10-CM | POA: Diagnosis present

## 2021-02-27 DIAGNOSIS — F10239 Alcohol dependence with withdrawal, unspecified: Secondary | ICD-10-CM | POA: Diagnosis present

## 2021-02-27 DIAGNOSIS — Z811 Family history of alcohol abuse and dependence: Secondary | ICD-10-CM

## 2021-02-27 LAB — CBC
HCT: 41.7 % (ref 36.0–46.0)
Hemoglobin: 14.4 g/dL (ref 12.0–15.0)
MCH: 34.5 pg — ABNORMAL HIGH (ref 26.0–34.0)
MCHC: 34.5 g/dL (ref 30.0–36.0)
MCV: 100 fL (ref 80.0–100.0)
Platelets: 150 10*3/uL (ref 150–400)
RBC: 4.17 MIL/uL (ref 3.87–5.11)
RDW: 15.6 % — ABNORMAL HIGH (ref 11.5–15.5)
WBC: 6.8 10*3/uL (ref 4.0–10.5)
nRBC: 0.7 % — ABNORMAL HIGH (ref 0.0–0.2)

## 2021-02-27 LAB — COMPREHENSIVE METABOLIC PANEL
ALT: 58 U/L — ABNORMAL HIGH (ref 0–44)
AST: 116 U/L — ABNORMAL HIGH (ref 15–41)
Albumin: 5.2 g/dL — ABNORMAL HIGH (ref 3.5–5.0)
Alkaline Phosphatase: 110 U/L (ref 38–126)
Anion gap: 16 — ABNORMAL HIGH (ref 5–15)
BUN: 7 mg/dL (ref 6–20)
CO2: 25 mmol/L (ref 22–32)
Calcium: 10.3 mg/dL (ref 8.9–10.3)
Chloride: 93 mmol/L — ABNORMAL LOW (ref 98–111)
Creatinine, Ser: 0.68 mg/dL (ref 0.44–1.00)
GFR, Estimated: >60 mL/min (ref >60)
Glucose, Bld: 108 mg/dL — ABNORMAL HIGH (ref 70–99)
Potassium: 2.8 mmol/L — ABNORMAL LOW (ref 3.5–5.1)
Sodium: 134 mmol/L — ABNORMAL LOW (ref 135–145)
Total Bilirubin: 1.8 mg/dL — ABNORMAL HIGH (ref 0.3–1.2)
Total Protein: 9 g/dL — ABNORMAL HIGH (ref 6.5–8.1)

## 2021-02-27 LAB — PHOSPHORUS: Phosphorus: 2.9 mg/dL (ref 2.5–4.6)

## 2021-02-27 LAB — HCG, SERUM, QUALITATIVE: Preg, Serum: NEGATIVE

## 2021-02-27 LAB — MAGNESIUM: Magnesium: 1.9 mg/dL (ref 1.7–2.4)

## 2021-02-27 MED ORDER — THIAMINE HCL 100 MG PO TABS: 100.0000 mg | ORAL_TABLET | Freq: Every day | ORAL | Status: DC

## 2021-02-27 MED ORDER — POTASSIUM CHLORIDE CRYS ER 20 MEQ PO TBCR
40.0000 meq | EXTENDED_RELEASE_TABLET | Freq: Once | ORAL | Status: AC
  Administered 2021-02-28: 40 meq via ORAL
  Filled 2021-02-27: qty 2

## 2021-02-27 MED ORDER — ADULT MULTIVITAMIN W/MINERALS CH
1.0000 | ORAL_TABLET | Freq: Every day | ORAL | Status: DC
Start: 2021-02-27 — End: 2021-02-28
  Administered 2021-02-28: 1 via ORAL
  Filled 2021-02-27: qty 1

## 2021-02-27 MED ORDER — LORAZEPAM 1 MG PO TABS
1.0000 mg | ORAL_TABLET | ORAL | Status: DC | PRN
  Administered 2021-02-28: 1 mg via ORAL
  Administered 2021-02-28 (×2): 2 mg via ORAL
  Filled 2021-02-27: qty 2
  Filled 2021-02-27: qty 1
  Filled 2021-02-27: qty 2

## 2021-02-27 MED ORDER — LIDOCAINE-EPINEPHRINE-TETRACAINE (LET) TOPICAL GEL
3.0000 mL | Freq: Once | TOPICAL | Status: AC
  Administered 2021-02-28: 3 mL via TOPICAL
  Filled 2021-02-27: qty 3

## 2021-02-27 MED ORDER — POTASSIUM CHLORIDE 10 MEQ/100ML IV SOLN
10.0000 meq | Freq: Once | INTRAVENOUS | Status: AC
  Administered 2021-02-28: 10 meq via INTRAVENOUS
  Filled 2021-02-27: qty 100

## 2021-02-27 MED ORDER — FOLIC ACID 1 MG PO TABS
1.0000 mg | ORAL_TABLET | Freq: Every day | ORAL | Status: DC
Start: 2021-02-27 — End: 2021-02-28
  Administered 2021-02-28: 1 mg via ORAL
  Filled 2021-02-27: qty 1

## 2021-02-27 MED ORDER — SODIUM CHLORIDE 0.9 % IV BOLUS
1000.0000 mL | Freq: Once | INTRAVENOUS | Status: AC
  Administered 2021-02-28: 1000 mL via INTRAVENOUS

## 2021-02-27 MED ORDER — LORAZEPAM 2 MG/ML IJ SOLN: 1.0000 mg | INTRAMUSCULAR | Status: DC | PRN

## 2021-02-27 MED ORDER — THIAMINE HCL 100 MG/ML IJ SOLN
100.0000 mg | Freq: Every day | INTRAMUSCULAR | Status: DC
  Administered 2021-02-28: 100 mg via INTRAVENOUS
  Filled 2021-02-27: qty 2

## 2021-02-27 MED ORDER — TETANUS-DIPHTH-ACELL PERTUSSIS 5-2.5-18.5 LF-MCG/0.5 IM SUSY
0.5000 mL | PREFILLED_SYRINGE | Freq: Once | INTRAMUSCULAR | Status: AC
  Administered 2021-02-28: 0.5 mL via INTRAMUSCULAR
  Filled 2021-02-27: qty 0.5

## 2021-02-27 NOTE — ED Notes (Signed)
Pt ambulatory in ED lobby. Pt advised feeling "extremely lightheaded"

## 2021-02-27 NOTE — ED Provider Notes (Signed)
[facility] [place] [facility]-EMERGENCY DEPT Provider Note   CSN: [id number] Arrival date & time: 02/27/21  2028     History Chief Complaint  Patient presents with   Laceration   Seizures   Dizziness   Nausea    Natasha Pope is a 25 y.o. female.  The history is provided by the patient and medical records. No language interpreter was used.  Laceration Seizures Dizziness  25 year old female significant history of polysubstance abuse, seizures, alcohol abuse, anxiety, presenting with complaints of generalized fatigue.  Patient report this morning she woke up to a pool of blood and realized she may have injured her head.  She noticed a cut to her left eyebrow.  As she reported having headache, nauseous, vomiting a few times, feeling very shaky lightheadedness and dizzy.  She felt she felt was tremulous.  She believes she may have had a seizure in his sleep.  She noted scratch on her hands he believes is coming from a cat.  Her pain is moderate in severity lightheadedness and shakiness is persistent.  She admits that she drinks on a regular basis but have not been consuming alcohol for the past several days.  She denies any recent regular drug use.  Denies runny nose sneezing or coughing and denies any recent sick contact.  She has not been vaccinated for COVID-19.  She is not up-to-date with tetanus.  She does endorse some chest discomfort but denies any significant abdominal pain.  Did report having abdominal pain several days ago that felt similar to pancreatitis.  She is unable to recall last menstrual period and states that her period has been irregular.  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   Delusional disorder (HCC)    Cocaine use disorder, moderate, in early remission (HCC) 08/13/2019   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/2019   Alcohol intoxication with moderate or severe use disorder (HCC) 01/16/2019   Alcohol withdrawal (HCC) 01/10/2019   Alcohol use disorder, severe, in early remission, dependence (HCC) 12/27/2018   Recurrent major depressive disorder (HCC) 12/27/2018   Seizure (HCC) 12/26/2018   Alcohol use with alcohol-induced mood disorder (HCC)    Substance induced mood disorder (HCC) 12/09/2018   Alcohol abuse 12/09/2018   Macrocytic anemia 05/19/2018   Thrombocytopenia (HCC) 05/19/2018   ASCUS of cervix with negative high risk HPV 03/07/2018   Tobacco abuse 01/31/2018   Asthma 01/31/2018   GAD (generalized anxiety disorder) 01/31/2018    Past Surgical History:  Procedure Laterality Date   dental procedure       OB History   No obstetric history on file.     Family History  Problem Relation Age of Onset   Depression Mother    Miscarriages / [place] Mother    Thyroid disease Mother    Hypertension Father    Alcohol abuse Father    Learning disabilities Sister    Hyperlipidemia Maternal Grandmother    Alcohol abuse Maternal Grandfather    Alcohol abuse Paternal Grandmother  Hypertension Paternal Grandfather     Social History   Tobacco Use   Smoking status: Former    Packs/day: 0.25    Types: Cigarettes    Quit date: 05/10/2018    Years since quitting: 2.8   Smokeless tobacco: Never  Vaping Use   Vaping Use: Never used  Substance Use Topics   Alcohol use: Yes    Comment: 02/17/2021 7-9 2-3 oz pours of liquor per day   Drug use: Not Currently    Types: Marijuana    Comment: 10/03/20: No longer using. Last use was 4 months ago.    Home Medications Prior to  Admission medications   Medication Sig Start Date End Date Taking? Authorizing Provider  buPROPion (WELLBUTRIN SR) 150 MG 12 hr tablet Take 1 tablet (150 mg total) by mouth 2 (two) times daily. 02/13/21   Janeece Agee, NP  cetirizine (ZYRTEC) 10 MG tablet Take 1 tablet (10 mg total) by mouth daily. 12/30/20   Janeece Agee, NP  clonazePAM (KLONOPIN) 0.5 MG tablet Take 1 tablet (0.5 mg total) by mouth 2 (two) times daily as needed for anxiety. 02/13/21   Janeece Agee, NP  DULoxetine (CYMBALTA) 60 MG capsule TAKE ONE CAPSULE BY MOUTH ONE TIME DAILY 07/28/20   Pucilowski, Roosvelt Maser, MD  gabapentin (NEURONTIN) 100 MG capsule TAKE ONE CAPSULE BY MOUTH TWICE DAILY Patient not taking: Reported on 02/17/2021 12/07/20   Orland Mustard, MD  hydrOXYzine (ATARAX/VISTARIL) 25 MG tablet TAKE ONE TABLET BY MOUTH THREE TIMES DAILY 01/24/21   Janeece Agee, NP  ibuprofen (ADVIL) 200 MG tablet Take 1-2 tablets (200-400 mg total) by mouth 3 (three) times daily as needed for headache or moderate pain. 01/16/19   Briant Cedar, MD  methocarbamol (ROBAXIN) 750 MG tablet TAKE ONE TABLET BY MOUTH EVERY EIGHT HOURS AS NEEDED FOR MUSCLE SPASM 01/18/21   Janeece Agee, NP  naltrexone (DEPADE) 50 MG tablet Take 1 tablet (50 mg total) by mouth daily. 01/24/21   Janeece Agee, NP  norethindrone-ethinyl estradiol-FE (HAILEY FE 1/20) 1-20 MG-MCG tablet Take 1 tablet by mouth daily. 02/13/21   Janeece Agee, NP  ondansetron (ZOFRAN-ODT) 4 MG disintegrating tablet Take 1 tablet (4 mg total) by mouth every 8 (eight) hours as needed for nausea or vomiting. 02/13/21   Janeece Agee, NP  potassium chloride (KLOR-CON) 10 MEQ tablet Take 1 tablet (10 mEq total) by mouth daily. 12/14/20 01/13/21  Terald Sleeper, MD  triamcinolone cream (KENALOG) 0.1 % Apply to affected area 1-2 times daily 05/04/20   Orland Mustard, MD  zonisamide (ZONEGRAN) 100 MG capsule Take 1 capsule (100 mg total) by mouth 2 (two) times daily. 12/15/20   Levert Feinstein, MD    Allergies    Lamotrigine  Review of Systems   Review of Systems  Neurological:  Positive for dizziness and seizures.  All other systems reviewed and are negative.  Physical Exam Updated Vital Signs BP (!) 151/115 (BP Location: Right Arm)   Pulse (!) 101   Temp 98.8 F (37.1 C) (Oral)   Resp 18   Ht  (1.676 m)   Wt 77.6 kg   SpO2 100%   BMI 27.60 kg/m   Physical Exam Vitals and nursing note reviewed.  Constitutional:      General: She is not in acute distress.    Appearance: She is well-developed.  HENT:     Head: Normocephalic.     Comments: 2 cm laceration noted to lateral aspects of left eyebrow.  Left orbital ecchymosis noted with tenderness to palpation.  No crepitus Eyes:     Conjunctiva/sclera: Conjunctivae normal.     Comments: Pupils equal round reactive to light and extraocular movement is intact.  Report pain with left lateral eye movement.  Horizontal nystagmus noted  Cardiovascular:     Rate and Rhythm: Tachycardia present.     Pulses: Normal pulses.     Heart sounds: Normal heart sounds.  Pulmonary:     Effort: Pulmonary effort is normal.     Breath sounds: No wheezing, rhonchi or rales.  Abdominal:     Palpations: Abdomen is soft.     Tenderness: There is no abdominal tenderness.  Musculoskeletal:     Cervical back: Normal range of motion and neck supple. No tenderness.     Comments: 5 out of 5 strength to all 4 extremities  Skin:    Findings: No rash.  Neurological:     Mental Status: She is alert. She is disoriented.     GCS: GCS eye subscore is 4. GCS verbal subscore is 5. GCS motor subscore is 6.     Motor: Weakness and tremor present.  Psychiatric:        Mood and Affect: Mood normal.    ED Results / Procedures / Treatments   Labs (all labs ordered are listed, but only abnormal results are displayed) Labs Reviewed  COMPREHENSIVE METABOLIC PANEL - Abnormal; Notable for the following components:      Result Value    Sodium 134 (*)    Potassium 2.8 (*)    Chloride 93 (*)    Glucose, Bld 108 (*)    Total Protein 9.0 (*)    Albumin 5.2 (*)    AST 116 (*)    ALT 58 (*)    Total Bilirubin 1.8 (*)    Anion gap 16 (*)    All other components within normal limits  CBC - Abnormal; Notable for the following components:   MCH 34.5 (*)    RDW 15.6 (*)    nRBC 0.7 (*)    All other components within normal limits  SARS CORONAVIRUS 2 (TAT 6-24 HRS)  MAGNESIUM  PHOSPHORUS  HCG, SERUM, QUALITATIVE    EKG EKG Interpretation  Date/Time:  Monday February 27 2021 21:14:44 EDT Ventricular Rate:  123 PR Interval:  142 QRS Duration: 94 QT Interval:  330 QTC Calculation: 472 R Axis:   60 Text Interpretation: Sinus tachycardia LAE, consider biatrial enlargement Borderline repolarization abnormality Since last tracing rate faster Otherwise no significant change Confirmed by Mancel Bale [phone]) on 02/27/2021 9:21:28 PM  Radiology CT HEAD WO CONTRAST ( )  Result Date: 02/27/2021 CLINICAL DATA:  Head trauma, minor, normal mental status (Age 34-64y); Neck trauma, uncomplicated (NEXUS/CCR neg) (Age 29-64y); Maxillofacial pain Laceration to left eyelid.  Forehead hematoma. EXAM: CT HEAD WITHOUT CONTRAST CT MAXILLOFACIAL WITHOUT CONTRAST CT CERVICAL SPINE WITHOUT CONTRAST TECHNIQUE: Multidetector CT imaging of the head, cervical spine, and maxillofacial structures were performed using the standard protocol without intravenous contrast. Multiplanar CT image reconstructions of the cervical spine and maxillofacial structures were also generated. Please note all images for the head, face, and cervical spine CT or under the maxillofacial accession in PACS. COMPARISON:  Head CT 12/26/2018 FINDINGS: CT HEAD FINDINGS Brain: No intracranial hemorrhage, mass effect, or midline shift. No hydrocephalus. The basilar cisterns are patent. No evidence of territorial infarct or acute ischemia. No extra-axial or intracranial fluid collection.  Vascular: No hyperdense vessel or unexpected calcification. Skull: Normal.  Negative for fracture or focal lesion. Other: None. CT MAXILLOFACIAL FINDINGS Osseous: Nasal bone, zygomatic arches, and mandibles are intact. The temporomandibular joints are congruent. Minimal rightward nasal septal deviation intact teeth in maxilla. No fracture of pterygoid plates. Orbits: No orbital fracture or globe injury. Sinuses: No sinus fracture or hemosinus. No sinus fluid level. 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 Soft tissues: Few prominent submental lymph nodes are likely reactive. Minimal left supraorbital soft tissue thickening. CT CERVICAL SPINE FINDINGS Alignment: Mild broad-based reversal of normal lordosis. No traumatic subluxation. Skull base and vertebrae: No acute fracture. Vertebral body heights are maintained. The dens and skull base are intact. Soft tissues and spinal canal: No prevertebral fluid or swelling. No visible canal hematoma. Disc levels: Minor endplate spurring at C5-C6 with minimal disc space narrowing. Upper chest: No acute or unexpected findings. Other: None. IMPRESSION: 1. No acute intracranial abnormality. No skull fracture. 2. No facial bone fracture. Minimal left supraorbital soft tissue thickening. 3. No fracture or subluxation of the cervical spine. Mild broad-based reversal of normal cervical lordosis may be positioning or muscle spasm. Electronically Signed   By: Narda Rutherford M.D.   On: 02/27/2021 22:47   CT Cervical Spine Wo Contrast  Result Date: 02/27/2021 CLINICAL DATA:  Head trauma, minor, normal mental status (Age 50-64y); Neck trauma, uncomplicated (NEXUS/CCR neg) (Age 71-64y); Maxillofacial pain Laceration to left eyelid.  Forehead hematoma. EXAM: CT HEAD WITHOUT CONTRAST CT MAXILLOFACIAL WITHOUT CONTRAST CT CERVICAL SPINE WITHOUT CONTRAST TECHNIQUE: Multidetector CT imaging of the head, cervical spine, and maxillofacial structures were performed using the standard  protocol without intravenous contrast. Multiplanar CT image reconstructions of the cervical spine and maxillofacial structures were also generated. Please note all images for the head, face, and cervical spine CT or under the maxillofacial accession in PACS. COMPARISON:  Head CT 12/26/2018 FINDINGS: CT HEAD FINDINGS Brain: No intracranial hemorrhage, mass effect, or midline shift. No hydrocephalus. The basilar cisterns are patent. No evidence of territorial infarct or acute ischemia. No extra-axial or intracranial fluid collection. Vascular: No hyperdense vessel or unexpected calcification. Skull: Normal. Negative for fracture or focal lesion. Other: None. CT MAXILLOFACIAL FINDINGS Osseous: Nasal bone, zygomatic arches, and mandibles are intact. The temporomandibular joints are congruent. Minimal rightward nasal septal deviation intact teeth in maxilla. No fracture of pterygoid plates. Orbits: No orbital fracture or globe injury. Sinuses: No sinus fracture or hemosinus. No sinus fluid level. 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 Soft tissues: Few prominent submental lymph nodes are likely reactive. Minimal left supraorbital soft tissue thickening. CT CERVICAL SPINE FINDINGS Alignment: Mild broad-based reversal of normal lordosis. No traumatic subluxation. Skull base and vertebrae: No acute fracture. Vertebral body heights are maintained. The dens and skull base are intact. Soft tissues and spinal canal: No prevertebral fluid or swelling. No visible canal hematoma. Disc levels: Minor endplate spurring at C5-C6 with minimal disc space narrowing. Upper chest: No acute or unexpected findings. Other: None. IMPRESSION: 1. No acute intracranial abnormality. No skull fracture. 2. No facial bone fracture. Minimal left supraorbital soft tissue thickening. 3. No fracture or subluxation of the cervical spine. Mild broad-based reversal of normal cervical lordosis may be positioning or muscle spasm. Electronically  Signed   By: Narda Rutherford M.D.   On: 02/27/2021 22:47   CT Maxillofacial Wo Contrast  Result Date: 02/27/2021 CLINICAL DATA:  Head trauma, minor, normal mental status (Age 29-64y); Neck trauma, uncomplicated (NEXUS/CCR neg) (Age 21-64y); Maxillofacial pain Laceration to left eyelid.  Forehead hematoma. EXAM: CT HEAD  WITHOUT CONTRAST CT MAXILLOFACIAL WITHOUT CONTRAST CT CERVICAL SPINE WITHOUT CONTRAST TECHNIQUE: Multidetector CT imaging of the head, cervical spine, and maxillofacial structures were performed using the standard protocol without intravenous contrast. Multiplanar CT image reconstructions of the cervical spine and maxillofacial structures were also generated. Please note all images for the head, face, and cervical spine CT or under the maxillofacial accession in PACS. COMPARISON:  Head CT 12/26/2018 FINDINGS: CT HEAD FINDINGS Brain: No intracranial hemorrhage, mass effect, or midline shift. No hydrocephalus. The basilar cisterns are patent. No evidence of territorial infarct or acute ischemia. No extra-axial or intracranial fluid collection. Vascular: No hyperdense vessel or unexpected calcification. Skull: Normal. Negative for fracture or focal lesion. Other: None. CT MAXILLOFACIAL FINDINGS Osseous: Nasal bone, zygomatic arches, and mandibles are intact. The temporomandibular joints are congruent. Minimal rightward nasal septal deviation intact teeth in maxilla. No fracture of pterygoid plates. Orbits: No orbital fracture or globe injury. Sinuses: No sinus fracture or hemosinus. No sinus fluid level. 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 Soft tissues: Few prominent submental lymph nodes are likely reactive. Minimal left supraorbital soft tissue thickening. CT CERVICAL SPINE FINDINGS Alignment: Mild broad-based reversal of normal lordosis. No traumatic subluxation. Skull base and vertebrae: No acute fracture. Vertebral body heights are maintained. The dens and skull base are intact.  Soft tissues and spinal canal: No prevertebral fluid or swelling. No visible canal hematoma. Disc levels: Minor endplate spurring at C5-C6 with minimal disc space narrowing. Upper chest: No acute or unexpected findings. Other: None. IMPRESSION: 1. No acute intracranial abnormality. No skull fracture. 2. No facial bone fracture. Minimal left supraorbital soft tissue thickening. 3. No fracture or subluxation of the cervical spine. Mild broad-based reversal of normal cervical lordosis may be positioning or muscle spasm. Electronically Signed   By: Narda Rutherford M.D.   On: 02/27/2021 22:47    Procedures .Marland KitchenLaceration Repair  Date/Time: 02/28/2021 2:01 AM Performed by: Fayrene Helper, PA-C Authorized by: Fayrene Helper, PA-C   Consent:    Consent obtained:  Verbal   Consent given by:  Patient   Risks discussed:  Infection, need for additional repair, pain, poor cosmetic result and poor wound healing   Alternatives discussed:  No treatment and delayed treatment Universal protocol:    Procedure explained and questions answered to patient or proxy's satisfaction: yes     Relevant documents present and verified: yes     Test results available: yes     Imaging studies available: yes     Required blood products, implants, devices, and special equipment available: yes     Site/side marked: yes     Immediately prior to procedure, a time out was called: yes     Patient identity confirmed:  Verbally with patient Anesthesia:    Anesthesia method:  Topical application   Topical anesthetic:  LET Laceration details:    Location:  Face   Face location:  L eyebrow   Length (cm):  2   Depth (mm):  2 Pre-procedure details:    Preparation:  Imaging obtained to evaluate for foreign bodies Exploration:    Limited defect created (wound extended): no     Hemostasis achieved with:  LET   Imaging outcome: foreign body not noted     Wound exploration: wound explored through full range of motion and entire depth of  wound visualized     Contaminated: no   Skin repair:    Repair method:  Steri-Strips   Number of Steri-Strips:  1 Repair type:  Repair type:  Simple Post-procedure details:    Dressing:  Open (no dressing)   Procedure completion:  Tolerated well, no immediate complications   Medications Ordered in ED Medications  LORazepam (ATIVAN) tablet 1-4 mg (2 mg Oral Given 02/28/21 0209)    Or  LORazepam (ATIVAN) injection 1-4 mg ( Intravenous See Alternative 02/28/21 0209)  thiamine tablet 100 mg ( Oral See Alternative 02/28/21 0106)    Or  thiamine (B-1) injection 100 mg (100 mg Intravenous Given 02/28/21 0106)  folic acid (FOLVITE) tablet 1 mg (1 mg Oral Given 02/28/21 0111)  multivitamin with minerals tablet 1 tablet (1 tablet Oral Given 02/28/21 0046)  sodium chloride 0.9 % bolus 1,000 mL (0 mLs Intravenous Paused 02/28/21 0159)  Tdap (BOOSTRIX) injection 0.5 mL (0.5 mLs Intramuscular Given 02/28/21 0105)  potassium chloride 10 mEq in 100 mL IVPB (0 mEq Intravenous Paused 02/28/21 0200)  potassium chloride SA (KLOR-CON) CR tablet 40 mEq (40 mEq Oral Given 02/28/21 0045)  lidocaine-EPINEPHrine-tetracaine (LET) topical gel (3 mLs Topical Given 02/28/21 0107)    ED Course  I have reviewed the triage vital signs and the nursing notes.  Pertinent labs & imaging results that were available during my care of the patient were reviewed by me and considered in my medical decision making (see chart for details).    MDM Rules/Calculators/A&P                           BP (!) 145/104 (BP Location: Right Arm)   Pulse (!) 112   Temp 98.7 F (37.1 C) (Oral)   Resp 18   Ht  (1.676 m)   Wt 77.6 kg   SpO2 95%   BMI 27.60 kg/m   Final Clinical Impression(s) / ED Diagnoses Final diagnoses:  Alcohol withdrawal delirium (HCC)  Hypokalemia    Rx / DC Orders ED Discharge Orders     None      10:24 PM Patient with history of alcohol withdrawal seizures who had an apparent seizure sometime last night  as she noted to have a laceration to her left eyebrow as well as bruising around her left orbit.  She appears to be tremulous with signs of withdrawal.  Will initiate CIWA protocol and will give IV fluid.  Labs remarkable for hypokalemia with potassium of 2.8 but normal magnesium level.  Will replenish.  Transaminitis with AST 116 ALT 58 and total bili of 1.8 likely suggestive of hepatic disease secondary to alcohol use.  No significant abdominal pain on exam to suggest acute pancreatitis or biliary disease.  She does have a laceration to the left eyebrow which is now not amenable for laceration repair as it has been more than 12 hours.  We will apply a Steri-Strip and clean her wound.  will update tetanus and will provide symptom control.  Appropriate imaging ordered.  Patient has several small shallow scratch marks on her right forearm without signs of infection.  This is likely coming from her cat.  12:23 AM Patient with positive orthostatic vital sign, very unsteady on her feet and not safe to go home.  CIWA protocol initiated.  CT scan of the head neck and maxillofacial obtained without any evidence of fracture or subluxation.  No facial bone fracture.  Minimal left supraorbital soft tissue thickening which is consistent with current presentation  2:04 AM I am worried for alcohol withdrawal, borderline delirium tremens therefore appreciate consultation from [facility] hospitalist, Dr. Toniann Fail who  agrees to see and will admit patient for further management.   Fayrene Helperran, Josedejesus Marcum, PA-C 02/28/21 0319    Sabas SousBero, Michael M, MD 02/28/21 [phone]

## 2021-02-27 NOTE — ED Provider Notes (Signed)
Emergency Medicine Provider Triage Evaluation Note  Natasha Pope , a 25 y.o. female  was evaluated in triage.  Pt complains of possible seizure, reports she woke up this morning in the hotel.  Has a laceration to the left eyelid, along with a black eye and a goose egg to her forehead.  States she has been on previous medication for seizures but is unsure which when she is taking.  She does take Atarax for her anxiety.  Does state her last alcohol intake was approximately 2 weeks ago.  Review of Systems  Positive: Seizure, wound, headache, dizzy, nausea Negative: Chest pain, shortness of breath  Physical Exam  BP (!) 163/122 (BP Location: Left Arm)   Pulse (!) 134 Comment: Triage RN notified  Temp 98.8 F (37.1 C) (Oral)   Resp 18   Ht 5\' 6"  (1.676 m)   Wt 77.6 kg   SpO2 99%   BMI 27.60 kg/m  Gen:   Awake, ill-appearing, multiple facial bruises, laceration to the left eyebrow Resp:  Normal MSK:   Moves extremities without difficulty  Other:  Tremulous on exam, wound noted to the left side of her tongue with no bleeding at the time.  Tremulous on exam.  Medical Decision Making  Medically screening exam initiated at 9:22 PM.  Appropriate orders placed.  was informed that the remainder of the evaluation will be completed by another provider, this initial triage assessment does not replace that evaluation, and the importance of remaining in the ED until their evaluation is complete.  Patient without any alcohol intake for the past 2 weeks, prior history of alcohol withdrawal, never hospitalized or intubated for this.  Currently on no medication for withdrawals, does report medication taken for seizure disorder, however she is unsure which medication this is.  States she woke up 12 hours ago with a laceration to her left eyebrow, goose egg to her forehead, wound to left side of her tongue.  Blood work, CIWA protocol has been ordered.  I feel that patient  needs to be expedited to an acute room due to acute alcohol withdrawal.   Natasha Car, PA-C 02/27/21 2126    2127, MD 02/28/21 1027

## 2021-02-27 NOTE — ED Triage Notes (Signed)
Pt states that she woke up this morning with a laceration over her left eyelid and a black eye. Pt thinks she may have had a seizure in her sleep. Pt reports feeling nauseous and dizzy all day. Pt has scratches to her hands and states that she has a cat at home.

## 2021-02-27 NOTE — Social Work (Signed)
MCEDCSW covering remotely received consult for SUD education/resources. CSW called into Pt room and spoke with Pt. Pt reports that her normal habit is to drink "almost every day" 3 beers or 1/4 of a bottle of hard alcohol.  Pt states that she has not had a drink in 2 weeks. CSW offered SUD resources and OP mental health resources.  CSW added these resources as well as AA [place] resources to AVS.

## 2021-02-27 NOTE — Discharge Instructions (Addendum)
Outpatient Mental Health Resources  [facility] - No Insurance Required/Free or Reduced Cost/Accepts Medicaid [address] [place], [place] [address] [phone]  [facility] - No Insurance Required/Free or Reduced Cost [address] [place], [place] [address] [phone]  [facility] - Free Services [address] [place], [place] [address] [phone]  [facility] - Medicaid/Other Insurances [address] [place]  [address] [place], [place] [address] [phone] ext [address] - Medicaid/Self Pay [address] [place], [place] [address] [phone]  [facility] - Medicaid [address] [place], [place] [address] [phone]  [facility] - No Insurance Required/Medicaid/Other Insurances [address] [place], [place] [address] [phone]  [facility] - No Insurance Required/Medicaid/Some Services Free/Other Insurances [address], [place] [address] [phone]  [facility] [address] [place], [place] [address] [phone]  [email or web address] [phone]

## 2021-02-28 ENCOUNTER — Encounter (HOSPITAL_COMMUNITY): Payer: Self-pay | Admitting: Internal Medicine

## 2021-02-28 DIAGNOSIS — Z8659 Personal history of other mental and behavioral disorders: Secondary | ICD-10-CM

## 2021-02-28 DIAGNOSIS — G40909 Epilepsy, unspecified, not intractable, without status epilepticus: Secondary | ICD-10-CM | POA: Diagnosis present

## 2021-02-28 DIAGNOSIS — F10232 Alcohol dependence with withdrawal with perceptual disturbance: Secondary | ICD-10-CM | POA: Diagnosis not present

## 2021-02-28 DIAGNOSIS — Z23 Encounter for immunization: Secondary | ICD-10-CM | POA: Diagnosis not present

## 2021-02-28 DIAGNOSIS — R569 Unspecified convulsions: Secondary | ICD-10-CM

## 2021-02-28 DIAGNOSIS — Z8669 Personal history of other diseases of the nervous system and sense organs: Secondary | ICD-10-CM

## 2021-02-28 DIAGNOSIS — R7989 Other specified abnormal findings of blood chemistry: Secondary | ICD-10-CM | POA: Diagnosis present

## 2021-02-28 DIAGNOSIS — F10231 Alcohol dependence with withdrawal delirium: Principal | ICD-10-CM

## 2021-02-28 DIAGNOSIS — Z79899 Other long term (current) drug therapy: Secondary | ICD-10-CM | POA: Diagnosis not present

## 2021-02-28 DIAGNOSIS — Y92239 Unspecified place in hospital as the place of occurrence of the external cause: Secondary | ICD-10-CM | POA: Diagnosis present

## 2021-02-28 DIAGNOSIS — Z818 Family history of other mental and behavioral disorders: Secondary | ICD-10-CM | POA: Diagnosis not present

## 2021-02-28 DIAGNOSIS — R441 Visual hallucinations: Secondary | ICD-10-CM | POA: Diagnosis present

## 2021-02-28 DIAGNOSIS — F32A Depression, unspecified: Secondary | ICD-10-CM | POA: Diagnosis present

## 2021-02-28 DIAGNOSIS — Z87891 Personal history of nicotine dependence: Secondary | ICD-10-CM | POA: Diagnosis not present

## 2021-02-28 DIAGNOSIS — E876 Hypokalemia: Secondary | ICD-10-CM | POA: Diagnosis present

## 2021-02-28 DIAGNOSIS — R17 Unspecified jaundice: Secondary | ICD-10-CM | POA: Diagnosis present

## 2021-02-28 DIAGNOSIS — F10931 Alcohol use, unspecified with withdrawal delirium: Secondary | ICD-10-CM

## 2021-02-28 DIAGNOSIS — E871 Hypo-osmolality and hyponatremia: Secondary | ICD-10-CM | POA: Diagnosis present

## 2021-02-28 DIAGNOSIS — E872 Acidosis: Secondary | ICD-10-CM | POA: Diagnosis present

## 2021-02-28 DIAGNOSIS — R748 Abnormal levels of other serum enzymes: Secondary | ICD-10-CM

## 2021-02-28 DIAGNOSIS — Z888 Allergy status to other drugs, medicaments and biological substances status: Secondary | ICD-10-CM | POA: Diagnosis not present

## 2021-02-28 DIAGNOSIS — F1010 Alcohol abuse, uncomplicated: Secondary | ICD-10-CM

## 2021-02-28 DIAGNOSIS — S0181XA Laceration without foreign body of other part of head, initial encounter: Secondary | ICD-10-CM | POA: Diagnosis present

## 2021-02-28 DIAGNOSIS — E861 Hypovolemia: Secondary | ICD-10-CM | POA: Diagnosis present

## 2021-02-28 DIAGNOSIS — W19XXXA Unspecified fall, initial encounter: Secondary | ICD-10-CM

## 2021-02-28 DIAGNOSIS — Z811 Family history of alcohol abuse and dependence: Secondary | ICD-10-CM | POA: Diagnosis not present

## 2021-02-28 DIAGNOSIS — J45909 Unspecified asthma, uncomplicated: Secondary | ICD-10-CM | POA: Diagnosis present

## 2021-02-28 DIAGNOSIS — Z20822 Contact with and (suspected) exposure to covid-19: Secondary | ICD-10-CM | POA: Diagnosis present

## 2021-02-28 DIAGNOSIS — S01112A Laceration without foreign body of left eyelid and periocular area, initial encounter: Secondary | ICD-10-CM | POA: Diagnosis present

## 2021-02-28 DIAGNOSIS — F419 Anxiety disorder, unspecified: Secondary | ICD-10-CM | POA: Diagnosis present

## 2021-02-28 DIAGNOSIS — W1830XA Fall on same level, unspecified, initial encounter: Secondary | ICD-10-CM | POA: Diagnosis present

## 2021-02-28 HISTORY — DX: Personal history of other specified conditions: Z86.59

## 2021-02-28 LAB — CK: Total CK: 196 U/L (ref 38–234)

## 2021-02-28 LAB — COMPREHENSIVE METABOLIC PANEL
ALT: 61 U/L — ABNORMAL HIGH (ref 0–44)
AST: 196 U/L — ABNORMAL HIGH (ref 15–41)
Albumin: 4.8 g/dL (ref 3.5–5.0)
Alkaline Phosphatase: 91 U/L (ref 38–126)
Anion gap: 15 (ref 5–15)
BUN: 6 mg/dL (ref 6–20)
CO2: 22 mmol/L (ref 22–32)
Calcium: 10.2 mg/dL (ref 8.9–10.3)
Chloride: 97 mmol/L — ABNORMAL LOW (ref 98–111)
Creatinine, Ser: 0.73 mg/dL (ref 0.44–1.00)
GFR, Estimated: >60 mL/min (ref >60)
Glucose, Bld: 88 mg/dL (ref 70–99)
Potassium: 3.5 mmol/L (ref 3.5–5.1)
Sodium: 134 mmol/L — ABNORMAL LOW (ref 135–145)
Total Bilirubin: 1.6 mg/dL — ABNORMAL HIGH (ref 0.3–1.2)
Total Protein: 7.8 g/dL (ref 6.5–8.1)

## 2021-02-28 LAB — MAGNESIUM: Magnesium: 1.8 mg/dL (ref 1.7–2.4)

## 2021-02-28 LAB — MRSA NEXT GEN BY PCR, NASAL: MRSA by PCR Next Gen: NOT DETECTED

## 2021-02-28 LAB — RESP PANEL BY RT-PCR (FLU A&B, COVID) ARPGX2
Influenza A by PCR: NEGATIVE
Influenza B by PCR: NEGATIVE
SARS Coronavirus 2 by RT PCR: NEGATIVE

## 2021-02-28 LAB — PHOSPHORUS: Phosphorus: 2.7 mg/dL (ref 2.5–4.6)

## 2021-02-28 MED ORDER — GABAPENTIN 100 MG PO CAPS
100.0000 mg | ORAL_CAPSULE | Freq: Two times a day (BID) | ORAL | Status: DC
  Administered 2021-02-28 – 2021-03-02 (×4): 100 mg via ORAL
  Filled 2021-02-28 (×5): qty 1

## 2021-02-28 MED ORDER — ZIPRASIDONE MESYLATE 20 MG IM SOLR
20.0000 mg | Freq: Once | INTRAMUSCULAR | Status: AC
  Administered 2021-02-28: 20 mg via INTRAMUSCULAR
  Filled 2021-02-28: qty 20

## 2021-02-28 MED ORDER — LORAZEPAM 2 MG/ML IJ SOLN
0.0000 mg | Freq: Three times a day (TID) | INTRAMUSCULAR | Status: DC
  Filled 2021-02-28: qty 1

## 2021-02-28 MED ORDER — DULOXETINE HCL 60 MG PO CPEP
60.0000 mg | ORAL_CAPSULE | Freq: Every day | ORAL | Status: DC
  Administered 2021-02-28 – 2021-03-02 (×3): 60 mg via ORAL
  Filled 2021-02-28 (×2): qty 1
  Filled 2021-02-28: qty 2

## 2021-02-28 MED ORDER — BUPROPION HCL ER (SR) 150 MG PO TB12
150.0000 mg | ORAL_TABLET | Freq: Two times a day (BID) | ORAL | Status: DC
  Administered 2021-02-28 – 2021-03-02 (×4): 150 mg via ORAL
  Filled 2021-02-28 (×5): qty 1

## 2021-02-28 MED ORDER — CHLORHEXIDINE GLUCONATE CLOTH 2 % EX PADS
6.0000 | MEDICATED_PAD | Freq: Every day | CUTANEOUS | Status: DC
  Administered 2021-02-28 – 2021-03-01 (×2): 6 via TOPICAL

## 2021-02-28 MED ORDER — SODIUM CHLORIDE 0.9 % IV BOLUS
500.0000 mL | Freq: Once | INTRAVENOUS | Status: AC
  Administered 2021-02-28: 500 mL via INTRAVENOUS

## 2021-02-28 MED ORDER — ACETAMINOPHEN 325 MG PO TABS
650.0000 mg | ORAL_TABLET | Freq: Four times a day (QID) | ORAL | Status: DC | PRN
  Administered 2021-02-28 – 2021-03-01 (×3): 650 mg via ORAL
  Filled 2021-02-28 (×3): qty 2

## 2021-02-28 MED ORDER — POTASSIUM CHLORIDE CRYS ER 20 MEQ PO TBCR
40.0000 meq | EXTENDED_RELEASE_TABLET | Freq: Once | ORAL | Status: DC
  Filled 2021-02-28: qty 2

## 2021-02-28 MED ORDER — LORAZEPAM 2 MG/ML IJ SOLN
0.0000 mg | INTRAMUSCULAR | Status: AC
  Administered 2021-02-28: 2 mg via INTRAVENOUS
  Administered 2021-02-28: 4 mg via INTRAVENOUS
  Administered 2021-03-01: 2 mg via INTRAVENOUS
  Administered 2021-03-01: 1 mg via INTRAVENOUS
  Filled 2021-02-28 (×3): qty 1
  Filled 2021-02-28: qty 2

## 2021-02-28 MED ORDER — THIAMINE HCL 100 MG PO TABS
100.0000 mg | ORAL_TABLET | Freq: Every day | ORAL | Status: DC
  Administered 2021-02-28 – 2021-03-02 (×3): 100 mg via ORAL
  Filled 2021-02-28 (×3): qty 1

## 2021-02-28 MED ORDER — ZONISAMIDE 100 MG PO CAPS
100.0000 mg | ORAL_CAPSULE | Freq: Two times a day (BID) | ORAL | Status: DC
  Administered 2021-02-28 – 2021-03-02 (×4): 100 mg via ORAL
  Filled 2021-02-28 (×6): qty 1

## 2021-02-28 MED ORDER — LORAZEPAM 1 MG PO TABS
1.0000 mg | ORAL_TABLET | ORAL | Status: DC | PRN
  Administered 2021-02-28: 4 mg via ORAL
  Administered 2021-03-02: 3 mg via ORAL
  Filled 2021-02-28: qty 4
  Filled 2021-02-28: qty 3
  Filled 2021-02-28: qty 2

## 2021-02-28 MED ORDER — POTASSIUM CHLORIDE CRYS ER 20 MEQ PO TBCR
40.0000 meq | EXTENDED_RELEASE_TABLET | Freq: Once | ORAL | Status: AC
  Administered 2021-02-28: 40 meq via ORAL
  Filled 2021-02-28: qty 2

## 2021-02-28 MED ORDER — POTASSIUM CHLORIDE IN NACL 20-0.9 MEQ/L-% IV SOLN
INTRAVENOUS | Status: DC
  Filled 2021-02-28 (×3): qty 1000

## 2021-02-28 MED ORDER — HYDROXYZINE HCL 25 MG PO TABS
25.0000 mg | ORAL_TABLET | Freq: Three times a day (TID) | ORAL | Status: DC
  Administered 2021-02-28: 25 mg via ORAL
  Filled 2021-02-28: qty 1

## 2021-02-28 MED ORDER — DEXMEDETOMIDINE HCL IN NACL 200 MCG/50ML IV SOLN
0.4000 ug/kg/h | INTRAVENOUS | Status: DC
  Administered 2021-02-28 (×2): 0.4 ug/kg/h via INTRAVENOUS
  Administered 2021-03-01: 0.7 ug/kg/h via INTRAVENOUS
  Filled 2021-02-28 (×4): qty 50

## 2021-02-28 MED ORDER — MAGNESIUM SULFATE 2 GM/50ML IV SOLN
2.0000 g | Freq: Once | INTRAVENOUS | Status: AC
  Administered 2021-02-28: 2 g via INTRAVENOUS
  Filled 2021-02-28: qty 50

## 2021-02-28 MED ORDER — ORAL CARE MOUTH RINSE
15.0000 mL | Freq: Two times a day (BID) | OROMUCOSAL | Status: DC
  Administered 2021-03-01 (×2): 15 mL via OROMUCOSAL

## 2021-02-28 MED ORDER — FOLIC ACID 1 MG PO TABS
1.0000 mg | ORAL_TABLET | Freq: Every day | ORAL | Status: DC
  Administered 2021-02-28 – 2021-03-02 (×3): 1 mg via ORAL
  Filled 2021-02-28 (×3): qty 1

## 2021-02-28 MED ORDER — THIAMINE HCL 100 MG/ML IJ SOLN
100.0000 mg | Freq: Every day | INTRAMUSCULAR | Status: DC
  Filled 2021-02-28 (×2): qty 2

## 2021-02-28 MED ORDER — LORAZEPAM 2 MG/ML IJ SOLN
1.0000 mg | INTRAMUSCULAR | Status: DC | PRN
  Administered 2021-02-28 – 2021-03-02 (×11): 2 mg via INTRAVENOUS
  Filled 2021-02-28 (×13): qty 1

## 2021-02-28 MED ORDER — ADULT MULTIVITAMIN W/MINERALS CH
1.0000 | ORAL_TABLET | Freq: Every day | ORAL | Status: DC
  Administered 2021-02-28 – 2021-03-02 (×3): 1 via ORAL
  Filled 2021-02-28 (×3): qty 1

## 2021-02-28 NOTE — Progress Notes (Signed)
   02/28/21 0900  What Happened  Was fall witnessed? No  Was patient injured? No  Patient found on floor  Found by Staff-comment  Stated prior activity other (comment) (hallucinating, climbed out of bed to "help")  Follow Up  MD notified Dr Alanda Slim  Time MD notified [phone]  Family notified Yes - comment  Time family notified 1030  Additional tests No  Progress note created (see row info) Yes  Adult Fall Risk Assessment  Risk Factor Category (scoring not indicated) Fall has occurred during this admission (document High fall risk);High fall risk per protocol (document High fall risk)  Patient Fall Risk Level High fall risk  Adult Fall Risk Interventions  Required Bundle Interventions *See Row Information* High fall risk - low, moderate, and high requirements implemented  Additional Interventions Safety Sitter/Safety Rounder  Screening for Fall Injury Risk (To be completed on HIGH fall risk patients) - Assessing Need for Floor Mats  Risk For Fall Injury- Criteria for Floor Mats Previous fall this admission  Will Implement Floor Mats Yes  Vitals  Temp 99.2 F (37.3 C)  Temp Source Oral  BP (!) 137/105  BP Location Right Arm  BP Method Automatic  Patient Position (if appropriate) Lying  Pulse Rate (!) 128  Pulse Rate Source Dinamap  ECG Heart Rate (!) 130  Cardiac Rhythm ST  Resp 18  Oxygen Therapy  SpO2 97 %  O2 Device Room Air  Patient Activity (if Appropriate) In bed  Pulse Oximetry Type Intermittent  Pain Assessment  Pain Scale 0-10  Pain Score 8  Pain Type Acute pain  Pain Location Head  Pain Orientation Mid  Pain Descriptors / Indicators Throbbing  Pain Frequency Constant  Pain Onset On-going  Patients Stated Pain Goal 1  Pain Intervention(s) Rest  PCA/Epidural/Spinal Assessment  Respiratory Pattern Regular;Unlabored  Neurological  Neuro (WDL) X  Level of Consciousness Alert  Orientation Level Oriented X4  Cognition Follows commands;Impulsive;Poor  judgement;Poor attention/concentration  Speech Clear  Pupil Assessment  No  Motor Function/Sensation Assessment Grip  R Hand Grip Strong  L Hand Grip Strong  Neuro Symptoms Agitation;Anxiety;Forgetful;Tremors  Neuro symptoms relieved by Anti-anxiety medication  Musculoskeletal  Musculoskeletal (WDL) X  Assistive Device None  Generalized Weakness Yes  Weight Bearing Restrictions No  Musculoskeletal Details  RLE Weakness  LLE Weakness  Integumentary  Integumentary (WDL) X  Skin Color Appropriate for ethnicity  Skin Condition Dry  Skin Integrity Ecchymosis;Excoriated (scratch marks)  Ecchymosis Location Face;Leg  Ecchymosis Location Orientation Right;Left;Bilateral  Ecchymosis Intervention Other (Comment) (assessed)  Excoriated Location Hand  Excoriated Location Orientation Bilateral  Excoriated Intervention Other (Comment) (assessed)  Skin Turgor Non-tenting  Pain Assessment  Date Pain First Started 02/28/21  Result of Injury No  Pain Assessment  Work-Related Injury No  Pain Screening  Response to Interventions floor mats started, 1 on 1 sitter  Effect of Pain on Daily Activities N/A  Clinical Progression Not changed    Post fall vitals are done. MD notified and started fall risk mats and 1 on 1 sitter.

## 2021-02-28 NOTE — ED Notes (Signed)
Admitting doc at bedside and IV team at bedside.

## 2021-02-28 NOTE — Progress Notes (Signed)
PROGRESS NOTE  Natasha Pope WUJ:[id number] DOB: [birth year]   PCP: Janeece Agee, NP  Patient is from: Home.  DOA: 02/27/2021 LOS: 0  Chief complaints:  Chief Complaint  Patient presents with   Laceration   Seizures   Dizziness   Nausea     Brief Narrative / Interim history: 25 year old female with history of alcohol abuse, anxiety, depression and seizure presenting with laceration and left black eye, and admitted for alcohol withdrawal.  She was tremulous.  Had a small laceration above her left eye which was sutured in ED.  She was given tetanus toxoid.  CT head, C-spine and maxillofacial without acute finding.  She was started on CIWA with as needed Ativan.  The next day, CIWA score elevated to 30s and 40s.  She got out of the bed and had a fall.  She did not hit her head.  No new injury or focal neurodeficit.  She has active visual hallucination.  She is tremulous with tachycardia and elevated BP.  Transferred to stepdown.  PCCM consulted.  Subjective: Seen and examined earlier this morning.  No major events overnight.  Patient had a fall after she got out of the bed.  Reportedly, no head trauma.  She denies generalized pain.  She seems to have active visual hallucination.  She denies auditory hallucination.  She is somewhat tremulous.  She is awake and alert but only oriented to self, month and year.  She thinks she is in [place].  Objective: Vitals:   02/28/21 0415 02/28/21 0534 02/28/21 0900 02/28/21 1100  BP: (!) 140/99 (!) 118/99 (!) 137/105 (!) 139/97  Pulse: (!) 109 (!) 106 (!) 128 (!) 115  Resp:  18 18   Temp:  98.4 F (36.9 C) 99.2 F (37.3 C)   TempSrc:  Oral Oral   SpO2:  97% 97%   Weight:      Height:        Intake/Output Summary (Last 24 hours) at 02/28/2021 1128 Last data filed at 02/28/2021 0830 Gross per 24 hour  Intake 340 ml  Output --  Net 340 ml   Filed Weights   02/27/21 2042  Weight: 77.6 kg    Examination:  GENERAL: No apparent  distress.  Nontoxic. HEENT: MMM.  Bruising to left eyelid.  Small laceration repaired. NECK: Supple.  No apparent JVD.  RESP: On RA.  No IWOB.  Fair aeration bilaterally. CVS: Tachycardic.Marland Kitchen Heart sounds normal.  ABD/GI/GU: BS+. Abd soft, NTND.  MSK/EXT:  Moves extremities. No apparent deformity. No edema.  SKIN: Bruising/small laceration over left eyelid.  Small bruise over left knee NEURO: Awake and alert.  Oriented to self, month and year.  She thinks she is in [place].  Follows commands.  No apparent focal neuro deficit.  Tremulous.  Motor 5/5 in all extremities.  No facial asymmetry.  PERRL.  Speech clear. PSYCH: Somewhat confused.  Active visual hallucination.  Procedures:  None  Microbiology summarized: COVID-19 and influenza PCR nonreactive.  Assessment & Plan: Severe alcohol withdrawal-markedly elevated CIWA.  She is very tremulous with visual hallucination, confusion, tachycardia and elevated BP raising concern for evolving DT. -Transfer to stepdown -Continue CIWA with Ativan for now -PCCM consulted.  May need Precedex. -Monitor and replenish electrolytes  Accidental fall in hospital-reportedly got out of the bed and fell on the floor this morning.  Reportedly no head trauma.  No focal neurodeficits on exam. -Fall precaution in place Secondary school teacher in place -Neuro check every 2 hours for the next  6 to 8 hours.  History of seizure: May be alcohol withdrawal seizure.  On zonisamide at home.  Not sure if she is compliant with this.  Admitting provider discussed with on-call neurologist, Dr. Iver Nestle who recommended continuing zonisamide and checking zonisamide levels  History of anxiety and depression -Resume home medications.  Facial laceration/left black eye-unclear cause of this.  She thinks she had seizure at home.  Sutured in ED.  Received tetanus vaccine in ED. -Needs suture removal in 4 to 5 days  Hypokalemia: Likely due to alcohol.  Improved.  Elevated  LFTs/hyperbilirubinemia-pattern consistent with alcohol. -Continue monitoring -Check CK  Tachycardia/elevated BP-likely due to alcohol withdrawal. -Monitor alcohol withdrawal as above -Consider Precedex.  Hyponatremia: Likely beer potomania. -Continue monitoring  Body mass index is 27.6 kg/m.         DVT prophylaxis:  SCDs Start: 02/28/21 0452  Code Status: Full code Family Communication: Patient and/or RN. Available if any question.  Level of care: Progressive Status is: Observation  The patient will require care spanning > 2 midnights and should be moved to inpatient because: Hemodynamically unstable, Persistent severe electrolyte disturbances, Altered mental status, IV treatments appropriate due to intensity of illness or inability to take PO, and Inpatient level of care appropriate due to severity of illness  Dispo: The patient is from: Home              Anticipated d/c is to: Home              Patient currently is not medically stable to d/c.   Difficult to place patient No       Consultants:  PCCM   Sch Meds:  Scheduled Meds:  buPROPion  150 mg Oral BID   DULoxetine  60 mg Oral Daily   folic acid  1 mg Oral Daily   gabapentin  100 mg Oral BID   hydrOXYzine  25 mg Oral TID   LORazepam  0-4 mg Intravenous Q4H   Followed by   Melene Muller ON 03/02/2021] LORazepam  0-4 mg Intravenous Q8H   multivitamin with minerals  1 tablet Oral Daily   thiamine  100 mg Oral Daily   Or   thiamine  100 mg Intravenous Daily   zonisamide  100 mg Oral BID   Continuous Infusions: PRN Meds:.acetaminophen, LORazepam **OR** LORazepam  Antimicrobials: Anti-infectives (From admission, onward)    None        I have personally reviewed the following labs and images: CBC: Recent Labs  Lab 02/27/21 2133  WBC 6.8  HGB 14.4  HCT 41.7  MCV 100.0  PLT 150   BMP &GFR Recent Labs  Lab 02/27/21 2133 02/28/21 0808  NA 134* 134*  K 2.8* 3.5  CL 93* 97*  CO2 25 22   GLUCOSE 108* 88  BUN 7 6  CREATININE 0.68 0.73  CALCIUM 10.3 10.2  MG 1.9 1.8  PHOS 2.9 2.7   Estimated Creatinine Clearance: 114 mL/min (by C-G formula based on SCr of 0.73 mg/dL). Liver & Pancreas: Recent Labs  Lab 02/27/21 2133 02/28/21 0808  AST 116* 196*  ALT 58* 61*  ALKPHOS 110 91  BILITOT 1.8* 1.6*  PROT 9.0* 7.8  ALBUMIN 5.2* 4.8   No results for input(s): LIPASE, AMYLASE in the last 168 hours. No results for input(s): AMMONIA in the last 168 hours. Diabetic: No results for input(s): HGBA1C in the last 72 hours. No results for input(s): GLUCAP in the last 168 hours. Cardiac Enzymes: No results for  input(s): CKTOTAL, CKMB, CKMBINDEX, TROPONINI in the last 168 hours. No results for input(s): PROBNP in the last 8760 hours. Coagulation Profile: No results for input(s): INR, PROTIME in the last 168 hours. Thyroid Function Tests: No results for input(s): TSH, T4TOTAL, FREET4, T3FREE, THYROIDAB in the last 72 hours. Lipid Profile: No results for input(s): CHOL, HDL, LDLCALC, TRIG, CHOLHDL, LDLDIRECT in the last 72 hours. Anemia Panel: No results for input(s): VITAMINB12, FOLATE, FERRITIN, TIBC, IRON, RETICCTPCT in the last 72 hours. Urine analysis:    Component Value Date/Time   COLORURINE YELLOW 12/21/2020 1542   APPEARANCEUR CLEAR 12/21/2020 1542   LABSPEC 1.015 12/21/2020 1542   PHURINE 7.5 12/21/2020 1542   GLUCOSEU NEGATIVE 12/21/2020 1542   HGBUR NEGATIVE 12/21/2020 1542   BILIRUBINUR NEGATIVE 12/21/2020 1542   BILIRUBINUR Negative 12/24/2018 1147   KETONESUR NEGATIVE 12/21/2020 1542   PROTEINUR 30 (A) 12/14/2020 1455   UROBILINOGEN 0.2 12/21/2020 1542   NITRITE NEGATIVE 12/21/2020 1542   LEUKOCYTESUR NEGATIVE 12/21/2020 1542   Sepsis Labs: Invalid input(s): PROCALCITONIN, LACTICIDVEN  Microbiology: Recent Results (from the past 240 hour(s))  Resp Panel by RT-PCR (Flu A&B, Covid) Nasopharyngeal Swab     Status: None   Collection Time: 02/28/21   4:29 AM   Specimen: Nasopharyngeal Swab; Nasopharyngeal(NP) swabs in vial transport medium  Result Value Ref Range Status   SARS Coronavirus 2 by RT PCR NEGATIVE NEGATIVE Final    Comment: (NOTE) SARS-CoV-2 target nucleic acids are NOT DETECTED.  The SARS-CoV-2 RNA is generally detectable in upper respiratory specimens during the acute phase of infection. The lowest concentration of SARS-CoV-2 viral copies this assay can detect is 138 copies/mL. A negative result does not preclude SARS-Cov-2 infection and should not be used as the sole basis for treatment or other patient management decisions. A negative result may occur with  improper specimen collection/handling, submission of specimen other than nasopharyngeal swab, presence of viral mutation(s) within the areas targeted by this assay, and inadequate number of viral copies(<138 copies/mL). A negative result must be combined with clinical observations, patient history, and epidemiological information. The expected result is Negative.  Fact Sheet for Patients:  BloggerCourse.com  Fact Sheet for Healthcare Providers:  SeriousBroker.it  This test is no t yet approved or cleared by the [place] FDA and  has been authorized for detection and/or diagnosis of SARS-CoV-2 by FDA under an Emergency Use Authorization (EUA). This EUA will remain  in effect (meaning this test can be used) for the duration of the COVID-19 declaration under Section 564(b)(1) of the Act, 21 U.S.C.section 360bbb-3(b)(1), unless the authorization is terminated  or revoked sooner.       Influenza A by PCR NEGATIVE NEGATIVE Final   Influenza B by PCR NEGATIVE NEGATIVE Final    Comment: (NOTE) The Xpert Xpress SARS-CoV-2/FLU/RSV plus assay is intended as an aid in the diagnosis of influenza from Nasopharyngeal swab specimens and should not be used as a sole basis for treatment. Nasal washings and aspirates  are unacceptable for Xpert Xpress SARS-CoV-2/FLU/RSV testing.  Fact Sheet for Patients: BloggerCourse.com  Fact Sheet for Healthcare Providers: SeriousBroker.it  This test is not yet approved or cleared by the [place] FDA and has been authorized for detection and/or diagnosis of SARS-CoV-2 by FDA under an Emergency Use Authorization (EUA). This EUA will remain in effect (meaning this test can be used) for the duration of the COVID-19 declaration under Section 564(b)(1) of the Act, 21 U.S.C. section 360bbb-3(b)(1), unless the authorization is terminated or revoked.  [facility]formed at [facility], [address] [place], [place] [id number]     Radiology Studies: CT HEAD WO CONTRAST (5MM)  Result Date: 02/27/2021 CLINICAL DATA:  Head trauma, minor, normal mental status (Age 419-64y); Neck trauma, uncomplicated (NEXUS/CCR neg) (Age 24-64y); Maxillofacial pain Laceration to left eyelid.  Forehead hematoma. EXAM: CT HEAD WITHOUT CONTRAST CT MAXILLOFACIAL WITHOUT CONTRAST CT CERVICAL SPINE WITHOUT CONTRAST TECHNIQUE: Multidetector CT imaging of the head, cervical spine, and maxillofacial structures were performed using the standard protocol without intravenous contrast. Multiplanar CT image reconstructions of the cervical spine and maxillofacial structures were also generated. Please note all images for the head, face, and cervical spine CT or under the maxillofacial accession in PACS. COMPARISON:  Head CT 12/26/2018 FINDINGS: CT HEAD FINDINGS Brain: No intracranial hemorrhage, mass effect, or midline shift. No hydrocephalus. The basilar cisterns are patent. No evidence of territorial infarct or acute ischemia. No extra-axial or intracranial fluid collection. Vascular: No hyperdense vessel or unexpected calcification. Skull: Normal. Negative for fracture or focal lesion. Other: None. CT MAXILLOFACIAL FINDINGS Osseous: Nasal bone,  zygomatic arches, and mandibles are intact. The temporomandibular joints are congruent. Minimal rightward nasal septal deviation intact teeth in maxilla. No fracture of pterygoid plates. Orbits: No orbital fracture or globe injury. Sinuses: No sinus fracture or hemosinus. No sinus fluid level. 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 Soft tissues: Few prominent submental lymph nodes are likely reactive. Minimal left supraorbital soft tissue thickening. CT CERVICAL SPINE FINDINGS Alignment: Mild broad-based reversal of normal lordosis. No traumatic subluxation. Skull base and vertebrae: No acute fracture. Vertebral body heights are maintained. The dens and skull base are intact. Soft tissues and spinal canal: No prevertebral fluid or swelling. No visible canal hematoma. Disc levels: Minor endplate spurring at C5-C6 with minimal disc space narrowing. Upper chest: No acute or unexpected findings. Other: None. IMPRESSION: 1. No acute intracranial abnormality. No skull fracture. 2. No facial bone fracture. Minimal left supraorbital soft tissue thickening. 3. No fracture or subluxation of the cervical spine. Mild broad-based reversal of normal cervical lordosis may be positioning or muscle spasm. Electronically Signed   By: Narda RutherfordMelanie  Sanford M.D.   On: 02/27/2021 22:47   CT Cervical Spine Wo Contrast  Result Date: 02/27/2021 CLINICAL DATA:  Head trauma, minor, normal mental status (Age 24-64y); Neck trauma, uncomplicated (NEXUS/CCR neg) (Age 24-64y); Maxillofacial pain Laceration to left eyelid.  Forehead hematoma. EXAM: CT HEAD WITHOUT CONTRAST CT MAXILLOFACIAL WITHOUT CONTRAST CT CERVICAL SPINE WITHOUT CONTRAST TECHNIQUE: Multidetector CT imaging of the head, cervical spine, and maxillofacial structures were performed using the standard protocol without intravenous contrast. Multiplanar CT image reconstructions of the cervical spine and maxillofacial structures were also generated. Please note all images for  the head, face, and cervical spine CT or under the maxillofacial accession in PACS. COMPARISON:  Head CT 12/26/2018 FINDINGS: CT HEAD FINDINGS Brain: No intracranial hemorrhage, mass effect, or midline shift. No hydrocephalus. The basilar cisterns are patent. No evidence of territorial infarct or acute ischemia. No extra-axial or intracranial fluid collection. Vascular: No hyperdense vessel or unexpected calcification. Skull: Normal. Negative for fracture or focal lesion. Other: None. CT MAXILLOFACIAL FINDINGS Osseous: Nasal bone, zygomatic arches, and mandibles are intact. The temporomandibular joints are congruent. Minimal rightward nasal septal deviation intact teeth in maxilla. No fracture of pterygoid plates. Orbits: No orbital fracture or globe injury. Sinuses: No sinus fracture or hemosinus. No sinus fluid level. 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 Soft tissues: Few prominent submental lymph nodes are likely  reactive. Minimal left supraorbital soft tissue thickening. CT CERVICAL SPINE FINDINGS Alignment: Mild broad-based reversal of normal lordosis. No traumatic subluxation. Skull base and vertebrae: No acute fracture. Vertebral body heights are maintained. The dens and skull base are intact. Soft tissues and spinal canal: No prevertebral fluid or swelling. No visible canal hematoma. Disc levels: Minor endplate spurring at C5-C6 with minimal disc space narrowing. Upper chest: No acute or unexpected findings. Other: None. IMPRESSION: 1. No acute intracranial abnormality. No skull fracture. 2. No facial bone fracture. Minimal left supraorbital soft tissue thickening. 3. No fracture or subluxation of the cervical spine. Mild broad-based reversal of normal cervical lordosis may be positioning or muscle spasm. Electronically Signed   By: Narda Rutherford M.D.   On: 02/27/2021 22:47   CT Maxillofacial Wo Contrast  Result Date: 02/27/2021 CLINICAL DATA:  Head trauma, minor, normal mental status (Age  28-64y); Neck trauma, uncomplicated (NEXUS/CCR neg) (Age 61-64y); Maxillofacial pain Laceration to left eyelid.  Forehead hematoma. EXAM: CT HEAD WITHOUT CONTRAST CT MAXILLOFACIAL WITHOUT CONTRAST CT CERVICAL SPINE WITHOUT CONTRAST TECHNIQUE: Multidetector CT imaging of the head, cervical spine, and maxillofacial structures were performed using the standard protocol without intravenous contrast. Multiplanar CT image reconstructions of the cervical spine and maxillofacial structures were also generated. Please note all images for the head, face, and cervical spine CT or under the maxillofacial accession in PACS. COMPARISON:  Head CT 12/26/2018 FINDINGS: CT HEAD FINDINGS Brain: No intracranial hemorrhage, mass effect, or midline shift. No hydrocephalus. The basilar cisterns are patent. No evidence of territorial infarct or acute ischemia. No extra-axial or intracranial fluid collection. Vascular: No hyperdense vessel or unexpected calcification. Skull: Normal. Negative for fracture or focal lesion. Other: None. CT MAXILLOFACIAL FINDINGS Osseous: Nasal bone, zygomatic arches, and mandibles are intact. The temporomandibular joints are congruent. Minimal rightward nasal septal deviation intact teeth in maxilla. No fracture of pterygoid plates. Orbits: No orbital fracture or globe injury. Sinuses: No sinus fracture or hemosinus. No sinus fluid level. 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 Soft tissues: Few prominent submental lymph nodes are likely reactive. Minimal left supraorbital soft tissue thickening. CT CERVICAL SPINE FINDINGS Alignment: Mild broad-based reversal of normal lordosis. No traumatic subluxation. Skull base and vertebrae: No acute fracture. Vertebral body heights are maintained. The dens and skull base are intact. Soft tissues and spinal canal: No prevertebral fluid or swelling. No visible canal hematoma. Disc levels: Minor endplate spurring at C5-C6 with minimal disc space narrowing. Upper  chest: No acute or unexpected findings. Other: None. IMPRESSION: 1. No acute intracranial abnormality. No skull fracture. 2. No facial bone fracture. Minimal left supraorbital soft tissue thickening. 3. No fracture or subluxation of the cervical spine. Mild broad-based reversal of normal cervical lordosis may be positioning or muscle spasm. Electronically Signed   By: Narda Rutherford M.D.   On: 02/27/2021 22:47       Alonda Weaber T. Duffy Dantonio [facility]  If 7PM-7AM, please contact night-coverage [email or web address] 02/28/2021, 11:28 AM

## 2021-02-28 NOTE — ED Notes (Signed)
Pt's IV infiltrated.  2 unsuccessful attempts to start new piv.  IV team consult ordered.

## 2021-02-28 NOTE — Consult Note (Signed)
NAME:  Natasha Pope, MRN:  [id number], DOB:  07-23-1996, LOS: 0 ADMISSION DATE:  02/27/2021, CONSULTATION DATE:  02/28/2021 REFERRING MD:  Dr Alanda Slim, CHIEF COMPLAINT:  Alcohol withdrawal   History of Present Illness:  25 year old lady with a history of alcohol abuse, anxiety, depression admitted with alcohol withdrawal Fall prior to coming into the hospital CT head without acute finding CIWA score elevated -Fall this morning -Concern for worsening and possible transfer to stepdown unit for further close monitoring on Precedex  Pertinent  Medical History   Past Medical History:  Diagnosis Date   Anxiety    Asthma    Depression    Pyelonephritis    Seizure-like activity (HCC)    Sepsis (HCC)    Significant Hospital Events: Including procedures, antibiotic start and stop dates in addition to other pertinent events   Elevated CIWA score  Interim History / Subjective:  Fall this a.m.  Objective   Blood pressure (!) 139/97, pulse (!) 115, temperature 99.2 F (37.3 C), temperature source Oral, resp. rate 18, height 5\' 6"  (1.676 m), weight 77.6 kg, SpO2 97 %.        Intake/Output Summary (Last 24 hours) at 02/28/2021 1147 Last data filed at 02/28/2021 0830 Gross per 24 hour  Intake 340 ml  Output --  Net 340 ml   Filed Weights   02/27/21 2042  Weight: 77.6 kg    Examination: General: Young lady, does not appear to be in extremitas HENT: Moist oral mucosa Lungs: Clear breath sounds Cardiovascular: S1-S2 appreciated Abdomen: Bowel sounds appreciated Extremities: No clubbing, no edema Neuro: Alert, oriented to person, hallucinating GU: [facility] Problem list     Assessment & Plan:  .  Severe alcohol withdrawal -Elevated CIWA scores -Will be transferred to stepdown unit -Start on Precedex  History of seizure disorder -Unclear if this is alcohol related -On zonisamide at home  History of depression and anxiety -Hold medications  Elevated  LFTs, hyperbilirubinemia -Related to alcohol use   Labs   CBC: Recent Labs  Lab 02/27/21 2133  WBC 6.8  HGB 14.4  HCT 41.7  MCV 100.0  PLT 150    Basic Metabolic Panel: Recent Labs  Lab 02/27/21 2133 02/28/21 0808  NA 134* 134*  K 2.8* 3.5  CL 93* 97*  CO2 25 22  GLUCOSE 108* 88  BUN 7 6  CREATININE 0.68 0.73  CALCIUM 10.3 10.2  MG 1.9 1.8  PHOS 2.9 2.7   GFR: Estimated Creatinine Clearance: 114 mL/min (by C-G formula based on SCr of 0.73 mg/dL). Recent Labs  Lab 02/27/21 2133  WBC 6.8    Liver Function Tests: Recent Labs  Lab 02/27/21 2133 02/28/21 0808  AST 116* 196*  ALT 58* 61*  ALKPHOS 110 91  BILITOT 1.8* 1.6*  PROT 9.0* 7.8  ALBUMIN 5.2* 4.8   No results for input(s): LIPASE, AMYLASE in the last 168 hours. No results for input(s): AMMONIA in the last 168 hours.  ABG    Component Value Date/Time   TCO2 24 05/25/2018 1637    Review of Systems:   Feels fair  Past Medical History:  She,  has a past medical history of Anxiety, Asthma, Depression, Pyelonephritis, Seizure-like activity (HCC), and Sepsis (HCC).   Surgical History:   Past Surgical History:  Procedure Laterality Date   dental procedure       Social History:   reports that she quit smoking about 2 years ago. Her smoking use included cigarettes. She  smoked an average of .25 packs per day. She has never used smokeless tobacco. She reports current alcohol use. She reports previous drug use. Drug: Marijuana.   Family History:  Her family history includes Alcohol abuse in her father, maternal grandfather, and paternal grandmother; Depression in her mother; Hyperlipidemia in her maternal grandmother; Hypertension in her father and paternal grandfather; Learning disabilities in her sister; Miscarriages / Stillbirths in her mother; Thyroid disease in her mother.   Allergies Allergies  Allergen Reactions   Lamotrigine Hives     Home Medications  Prior to Admission medications    Medication Sig Start Date End Date Taking? Authorizing Provider  buPROPion (WELLBUTRIN SR) 150 MG 12 hr tablet Take 1 tablet (150 mg total) by mouth 2 (two) times daily. 02/13/21  Yes Janeece Agee, NP  cetirizine (ZYRTEC) 10 MG tablet Take 1 tablet (10 mg total) by mouth daily. 12/30/20  Yes Janeece Agee, NP  clonazePAM (KLONOPIN) 0.5 MG tablet Take 1 tablet (0.5 mg total) by mouth 2 (two) times daily as needed for anxiety. 02/13/21  Yes Janeece Agee, NP  DULoxetine (CYMBALTA) 60 MG capsule TAKE ONE CAPSULE BY MOUTH ONE TIME DAILY 07/28/20  Yes Pucilowski, Olgierd A, MD  gabapentin (NEURONTIN) 100 MG capsule TAKE ONE CAPSULE BY MOUTH TWICE DAILY 12/07/20  Yes Orland Mustard, MD  hydrOXYzine (ATARAX/VISTARIL) 25 MG tablet TAKE ONE TABLET BY MOUTH THREE TIMES DAILY 01/24/21  Yes Janeece Agee, NP  ibuprofen (ADVIL) 200 MG tablet Take 1-2 tablets (200-400 mg total) by mouth 3 (three) times daily as needed for headache or moderate pain. 01/16/19  Yes Briant Cedar, MD  methocarbamol (ROBAXIN) 750 MG tablet TAKE ONE TABLET BY MOUTH EVERY EIGHT HOURS AS NEEDED FOR MUSCLE SPASM 01/18/21  Yes Janeece Agee, NP  naltrexone (DEPADE) 50 MG tablet Take 1 tablet (50 mg total) by mouth daily. 01/24/21  Yes Janeece Agee, NP  norethindrone-ethinyl estradiol-FE (HAILEY FE 1/20) 1-20 MG-MCG tablet Take 1 tablet by mouth daily. 02/13/21  Yes Janeece Agee, NP  ondansetron (ZOFRAN-ODT) 4 MG disintegrating tablet Take 1 tablet (4 mg total) by mouth every 8 (eight) hours as needed for nausea or vomiting. 02/13/21  Yes Janeece Agee, NP  potassium chloride (KLOR-CON) 10 MEQ tablet Take 1 tablet (10 mEq total) by mouth daily. 12/14/20 04/29/21 Yes Terald Sleeper, MD  triamcinolone cream (KENALOG) 0.1 % Apply to affected area 1-2 times daily 05/04/20  Yes Orland Mustard, MD  zonisamide (ZONEGRAN) 100 MG capsule Take 1 capsule (100 mg total) by mouth 2 (two) times daily. 12/15/20  Yes Levert Feinstein, MD    Virl Diamond, MD [place] [facility] Pager: See Loretha Stapler

## 2021-02-28 NOTE — H&P (Signed)
History and Physical    Natasha Pope [place] VQM:[id number] DOB: April 08, 1996 DOA: 02/27/2021  PCP: Janeece Agee, NP  Patient coming from: Home.  Chief Complaint: Woke up with bruise on the face.  HPI: Natasha Pope is a 25 y.o. female with history of alcohol use, anxiety depression and seizure on zonisamide work-up yesterday morning with ecchymotic area on the left periorbital area.  Patient states he has not had any alcohol for last 1 week.  Patient states he may also have missed some of her home medications for the last couple of days.  ED Course: In the ER patient is found to be tremulous.  Nonfocal.  Had a small laceration on the left side of face which was sutured.  Tetanus toxoid was given.  CT head C-spine and maxillofacial does not show anything acute.  Labs show hypokalemia.  Patient also had an anion gap metabolic acidosis likely from alcohol.  EKG shows sinus tachycardia.  On exam patient is having tremors and signs of active alcohol withdrawal.  Patient admitted for further management.  COVID test is pending.  Review of Systems: As per HPI, rest all negative.   Past Medical History:  Diagnosis Date   Anxiety    Asthma    Depression    Pyelonephritis    Seizure-like activity (HCC)    Sepsis (HCC)     Past Surgical History:  Procedure Laterality Date   dental procedure       reports that she quit smoking about 2 years ago. Her smoking use included cigarettes. She smoked an average of .25 packs per day. She has never used smokeless tobacco. She reports current alcohol use. She reports previous drug use. Drug: Marijuana.  Allergies  Allergen Reactions   Lamotrigine Hives    Family History  Problem Relation Age of Onset   Depression Mother    Miscarriages / [place] Mother    Thyroid disease Mother    Hypertension Father    Alcohol abuse Father    Learning disabilities Sister    Hyperlipidemia Maternal Grandmother    Alcohol abuse Maternal  Grandfather    Alcohol abuse Paternal Grandmother    Hypertension Paternal Grandfather     Prior to Admission medications   Medication Sig Start Date End Date Taking? Authorizing Provider  buPROPion (WELLBUTRIN SR) 150 MG 12 hr tablet Take 1 tablet (150 mg total) by mouth 2 (two) times daily. 02/13/21  Yes Janeece Agee, NP  cetirizine (ZYRTEC) 10 MG tablet Take 1 tablet (10 mg total) by mouth daily. 12/30/20  Yes Janeece Agee, NP  clonazePAM (KLONOPIN) 0.5 MG tablet Take 1 tablet (0.5 mg total) by mouth 2 (two) times daily as needed for anxiety. 02/13/21  Yes Janeece Agee, NP  DULoxetine (CYMBALTA) 60 MG capsule TAKE ONE CAPSULE BY MOUTH ONE TIME DAILY 07/28/20  Yes Pucilowski, Olgierd A, MD  gabapentin (NEURONTIN) 100 MG capsule TAKE ONE CAPSULE BY MOUTH TWICE DAILY 12/07/20  Yes Orland Mustard, MD  hydrOXYzine (ATARAX/VISTARIL) 25 MG tablet TAKE ONE TABLET BY MOUTH THREE TIMES DAILY 01/24/21  Yes Janeece Agee, NP  ibuprofen (ADVIL) 200 MG tablet Take 1-2 tablets (200-400 mg total) by mouth 3 (three) times daily as needed for headache or moderate pain. 01/16/19  Yes Briant Cedar, MD  methocarbamol (ROBAXIN) 750 MG tablet TAKE ONE TABLET BY MOUTH EVERY EIGHT HOURS AS NEEDED FOR MUSCLE SPASM 01/18/21  Yes Janeece Agee, NP  naltrexone (DEPADE) 50 MG tablet Take 1 tablet (50 mg total) by  mouth daily. 01/24/21  Yes Janeece Agee, NP  norethindrone-ethinyl estradiol-FE (HAILEY FE 1/20) 1-20 MG-MCG tablet Take 1 tablet by mouth daily. 02/13/21  Yes Janeece Agee, NP  ondansetron (ZOFRAN-ODT) 4 MG disintegrating tablet Take 1 tablet (4 mg total) by mouth every 8 (eight) hours as needed for nausea or vomiting. 02/13/21  Yes Janeece Agee, NP  potassium chloride (KLOR-CON) 10 MEQ tablet Take 1 tablet (10 mEq total) by mouth daily. 12/14/20 04/29/21 Yes Terald Sleeper, MD  triamcinolone cream (KENALOG) 0.1 % Apply to affected area 1-2 times daily 05/04/20  Yes Orland Mustard, MD  zonisamide  (ZONEGRAN) 100 MG capsule Take 1 capsule (100 mg total) by mouth 2 (two) times daily. 12/15/20  Yes Levert Feinstein, MD    Physical Exam: Constitutional: Moderately built and nourished. Vitals:   02/28/21 0125 02/28/21 0208 02/28/21 0247 02/28/21 0415  BP: (!) 152/134 (!) 141/101 (!) 145/104 (!) 140/99  Pulse: (!) 110 (!) 126 (!) 112 (!) 109  Resp: 18 18 18    Temp: 98.7 F (37.1 C)     TempSrc: Oral     SpO2: 100% 100% 95%   Weight:      Height:       Eyes: Anicteric no pallor. ENMT: Periorbital hematoma on the left side.  No discharge. Neck: No neck rigidity. Respiratory: No rhonchi or crepitations. Cardiovascular: S1-S2 heard. Abdomen: Soft nontender bowel sound present. Musculoskeletal: No edema. Skin: Ecchymotic area on the left periorbital area. Neurologic: Alert awake oriented to time place and person.  Moves all extremities patient appears tremulous. Psychiatric: Appears tremulous.  Denies any suicidal ideation.   Labs on Admission: I have personally reviewed following labs and imaging studies  CBC: Recent Labs  Lab 02/27/21 2133  WBC 6.8  HGB 14.4  HCT 41.7  MCV 100.0  PLT 150   Basic Metabolic Panel: Recent Labs  Lab 02/27/21 2133  NA 134*  K 2.8*  CL 93*  CO2 25  GLUCOSE 108*  BUN 7  CREATININE 0.68  CALCIUM 10.3  MG 1.9  PHOS 2.9   GFR: Estimated Creatinine Clearance: 114 mL/min (by C-G formula based on SCr of 0.68 mg/dL). Liver Function Tests: Recent Labs  Lab 02/27/21 2133  AST 116*  ALT 58*  ALKPHOS 110  BILITOT 1.8*  PROT 9.0*  ALBUMIN 5.2*   No results for input(s): LIPASE, AMYLASE in the last 168 hours. No results for input(s): AMMONIA in the last 168 hours. Coagulation Profile: No results for input(s): INR, PROTIME in the last 168 hours. Cardiac Enzymes: No results for input(s): CKTOTAL, CKMB, CKMBINDEX, TROPONINI in the last 168 hours. BNP (last 3 results) No results for input(s): PROBNP in the last 8760 hours. HbA1C: No  results for input(s): HGBA1C in the last 72 hours. CBG: No results for input(s): GLUCAP in the last 168 hours. Lipid Profile: No results for input(s): CHOL, HDL, LDLCALC, TRIG, CHOLHDL, LDLDIRECT in the last 72 hours. Thyroid Function Tests: No results for input(s): TSH, T4TOTAL, FREET4, T3FREE, THYROIDAB in the last 72 hours. Anemia Panel: No results for input(s): VITAMINB12, FOLATE, FERRITIN, TIBC, IRON, RETICCTPCT in the last 72 hours. Urine analysis:    Component Value Date/Time   COLORURINE YELLOW 12/21/2020 1542   APPEARANCEUR CLEAR 12/21/2020 1542   LABSPEC 1.015 12/21/2020 1542   PHURINE 7.5 12/21/2020 1542   GLUCOSEU NEGATIVE 12/21/2020 1542   HGBUR NEGATIVE 12/21/2020 1542   BILIRUBINUR NEGATIVE 12/21/2020 1542   BILIRUBINUR Negative 12/24/2018 1147   KETONESUR NEGATIVE 12/21/2020 1542  PROTEINUR 30 (A) 12/14/2020 1455   UROBILINOGEN 0.2 12/21/2020 1542   NITRITE NEGATIVE 12/21/2020 1542   LEUKOCYTESUR NEGATIVE 12/21/2020 1542   Sepsis Labs: @LABRCNTIP (procalcitonin:4,lacticidven:4) )No results found for this or any previous visit (from the past 240 hour(s)).   Radiological Exams on Admission: CT HEAD WO CONTRAST (5MM)  Result Date: 02/27/2021 CLINICAL DATA:  Head trauma, minor, normal mental status (Age 24-64y); Neck trauma, uncomplicated (NEXUS/CCR neg) (Age 24-64y); Maxillofacial pain Laceration to left eyelid.  Forehead hematoma. EXAM: CT HEAD WITHOUT CONTRAST CT MAXILLOFACIAL WITHOUT CONTRAST CT CERVICAL SPINE WITHOUT CONTRAST TECHNIQUE: Multidetector CT imaging of the head, cervical spine, and maxillofacial structures were performed using the standard protocol without intravenous contrast. Multiplanar CT image reconstructions of the cervical spine and maxillofacial structures were also generated. Please note all images for the head, face, and cervical spine CT or under the maxillofacial accession in PACS. COMPARISON:  Head CT 12/26/2018 FINDINGS: CT HEAD FINDINGS  Brain: No intracranial hemorrhage, mass effect, or midline shift. No hydrocephalus. The basilar cisterns are patent. No evidence of territorial infarct or acute ischemia. No extra-axial or intracranial fluid collection. Vascular: No hyperdense vessel or unexpected calcification. Skull: Normal. Negative for fracture or focal lesion. Other: None. CT MAXILLOFACIAL FINDINGS Osseous: Nasal bone, zygomatic arches, and mandibles are intact. The temporomandibular joints are congruent. Minimal rightward nasal septal deviation intact teeth in maxilla. No fracture of pterygoid plates. Orbits: No orbital fracture or globe injury. Sinuses: No sinus fracture or hemosinus. No sinus fluid level. 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 Soft tissues: Few prominent submental lymph nodes are likely reactive. Minimal left supraorbital soft tissue thickening. CT CERVICAL SPINE FINDINGS Alignment: Mild broad-based reversal of normal lordosis. No traumatic subluxation. Skull base and vertebrae: No acute fracture. Vertebral body heights are maintained. The dens and skull base are intact. Soft tissues and spinal canal: No prevertebral fluid or swelling. No visible canal hematoma. Disc levels: Minor endplate spurring at C5-C6 with minimal disc space narrowing. Upper chest: No acute or unexpected findings. Other: None. IMPRESSION: 1. No acute intracranial abnormality. No skull fracture. 2. No facial bone fracture. Minimal left supraorbital soft tissue thickening. 3. No fracture or subluxation of the cervical spine. Mild broad-based reversal of normal cervical lordosis may be positioning or muscle spasm. Electronically Signed   By: Narda RutherfordMelanie  Sanford M.D.   On: 02/27/2021 22:47   CT Cervical Spine Wo Contrast  Result Date: 02/27/2021 CLINICAL DATA:  Head trauma, minor, normal mental status (Age 10719-64y); Neck trauma, uncomplicated (NEXUS/CCR neg) (Age 24-64y); Maxillofacial pain Laceration to left eyelid.  Forehead hematoma. EXAM: CT  HEAD WITHOUT CONTRAST CT MAXILLOFACIAL WITHOUT CONTRAST CT CERVICAL SPINE WITHOUT CONTRAST TECHNIQUE: Multidetector CT imaging of the head, cervical spine, and maxillofacial structures were performed using the standard protocol without intravenous contrast. Multiplanar CT image reconstructions of the cervical spine and maxillofacial structures were also generated. Please note all images for the head, face, and cervical spine CT or under the maxillofacial accession in PACS. COMPARISON:  Head CT 12/26/2018 FINDINGS: CT HEAD FINDINGS Brain: No intracranial hemorrhage, mass effect, or midline shift. No hydrocephalus. The basilar cisterns are patent. No evidence of territorial infarct or acute ischemia. No extra-axial or intracranial fluid collection. Vascular: No hyperdense vessel or unexpected calcification. Skull: Normal. Negative for fracture or focal lesion. Other: None. CT MAXILLOFACIAL FINDINGS Osseous: Nasal bone, zygomatic arches, and mandibles are intact. The temporomandibular joints are congruent. Minimal rightward nasal septal deviation intact teeth in maxilla. No fracture of pterygoid plates. Orbits: No orbital  fracture or globe injury. Sinuses: No sinus fracture or hemosinus. No sinus fluid level. 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 Soft tissues: Few prominent submental lymph nodes are likely reactive. Minimal left supraorbital soft tissue thickening. CT CERVICAL SPINE FINDINGS Alignment: Mild broad-based reversal of normal lordosis. No traumatic subluxation. Skull base and vertebrae: No acute fracture. Vertebral body heights are maintained. The dens and skull base are intact. Soft tissues and spinal canal: No prevertebral fluid or swelling. No visible canal hematoma. Disc levels: Minor endplate spurring at C5-C6 with minimal disc space narrowing. Upper chest: No acute or unexpected findings. Other: None. IMPRESSION: 1. No acute intracranial abnormality. No skull fracture. 2. No facial bone  fracture. Minimal left supraorbital soft tissue thickening. 3. No fracture or subluxation of the cervical spine. Mild broad-based reversal of normal cervical lordosis may be positioning or muscle spasm. Electronically Signed   By: Narda Rutherford M.D.   On: 02/27/2021 22:47   CT Maxillofacial Wo Contrast  Result Date: 02/27/2021 CLINICAL DATA:  Head trauma, minor, normal mental status (Age 3-64y); Neck trauma, uncomplicated (NEXUS/CCR neg) (Age 31-64y); Maxillofacial pain Laceration to left eyelid.  Forehead hematoma. EXAM: CT HEAD WITHOUT CONTRAST CT MAXILLOFACIAL WITHOUT CONTRAST CT CERVICAL SPINE WITHOUT CONTRAST TECHNIQUE: Multidetector CT imaging of the head, cervical spine, and maxillofacial structures were performed using the standard protocol without intravenous contrast. Multiplanar CT image reconstructions of the cervical spine and maxillofacial structures were also generated. Please note all images for the head, face, and cervical spine CT or under the maxillofacial accession in PACS. COMPARISON:  Head CT 12/26/2018 FINDINGS: CT HEAD FINDINGS Brain: No intracranial hemorrhage, mass effect, or midline shift. No hydrocephalus. The basilar cisterns are patent. No evidence of territorial infarct or acute ischemia. No extra-axial or intracranial fluid collection. Vascular: No hyperdense vessel or unexpected calcification. Skull: Normal. Negative for fracture or focal lesion. Other: None. CT MAXILLOFACIAL FINDINGS Osseous: Nasal bone, zygomatic arches, and mandibles are intact. The temporomandibular joints are congruent. Minimal rightward nasal septal deviation intact teeth in maxilla. No fracture of pterygoid plates. Orbits: No orbital fracture or globe injury. Sinuses: No sinus fracture or hemosinus. No sinus fluid level. 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 Soft tissues: Few prominent submental lymph nodes are likely reactive. Minimal left supraorbital soft tissue thickening. CT CERVICAL  SPINE FINDINGS Alignment: Mild broad-based reversal of normal lordosis. No traumatic subluxation. Skull base and vertebrae: No acute fracture. Vertebral body heights are maintained. The dens and skull base are intact. Soft tissues and spinal canal: No prevertebral fluid or swelling. No visible canal hematoma. Disc levels: Minor endplate spurring at C5-C6 with minimal disc space narrowing. Upper chest: No acute or unexpected findings. Other: None. IMPRESSION: 1. No acute intracranial abnormality. No skull fracture. 2. No facial bone fracture. Minimal left supraorbital soft tissue thickening. 3. No fracture or subluxation of the cervical spine. Mild broad-based reversal of normal cervical lordosis may be positioning or muscle spasm. Electronically Signed   By: Narda Rutherford M.D.   On: 02/27/2021 22:47    EKG: Independently reviewed.  Sinus tachycardia.  Assessment/Plan Principal Problem:   Alcohol withdrawal (HCC) Active Problems:   Seizure (HCC)   Hypokalemia    Alcohol withdrawal -on exam patient has tremors and signs of active alcohol withdrawal.  We will keep patient on IV Ativan and closely monitor.  Thiamine. Seizure -patient's presentation is concerning for possible seizure could be from alcohol withdrawal.  However patient is also on zonisamide for seizures prescribed by neurologist.  Discussed  with on-call neurologist Dr. Iver Nestle who advised continuing zonisamide for now and check zonisamide levels. Hypokalemia cause not clear could be from poor nutrition.  Replace recheck.   History of anxiety and depression no medications needs to be verified.  COVID test is pending.  Home medication needs to be verified.  I for now continued zonisamide and gabapentin.   DVT prophylaxis: SCDs.  Patient has left periorbital ecchymotic area.  If this does not worsen then we can start patient on Lovenox. Code Status: Full code. Family Communication: Discussed with patient. Disposition Plan: Home when  stable. Consults called: Social work. Admission status: Observation.   Eduard Clos MD [facility] Pager [phone].  If 7PM-7AM, please contact night-coverage [email or web address] Password [email or web address]  02/28/2021, 4:54 AM

## 2021-02-28 NOTE — Progress Notes (Signed)
   02/28/21 0900  What Happened  Was fall witnessed? No  Was patient injured? No  Patient found on floor  Found by Staff-comment  Stated prior activity other (comment) (hallucinating, climbed out of bed to "help")  Follow Up  MD notified Dr Alanda Slim  Time MD notified [phone]  Additional tests No  Adult Fall Risk Assessment  Risk Factor Category (scoring not indicated) Fall has occurred during this admission (document High fall risk);High fall risk per protocol (document High fall risk)  Patient Fall Risk Level High fall risk  Adult Fall Risk Interventions  Required Bundle Interventions *See Row Information* High fall risk - low, moderate, and high requirements implemented  Additional Interventions Safety Sitter/Safety Rounder  Screening for Fall Injury Risk (To be completed on HIGH fall risk patients) - Assessing Need for Floor Mats  Risk For Fall Injury- Criteria for Floor Mats Previous fall this admission  Will Implement Floor Mats Yes  Vitals  Temp 99.2 F (37.3 C)  Temp Source Oral  BP (!) 137/105  BP Location Right Arm  BP Method Automatic  Patient Position (if appropriate) Lying  Pulse Rate (!) 128  Pulse Rate Source Dinamap  Cardiac Rhythm ST  Resp 18  Oxygen Therapy  SpO2 97 %  O2 Device Room Air  PCA/Epidural/Spinal Assessment  Respiratory Pattern Regular;Unlabored

## 2021-03-01 ENCOUNTER — Telehealth: Payer: Self-pay | Admitting: Registered Nurse

## 2021-03-01 DIAGNOSIS — R569 Unspecified convulsions: Secondary | ICD-10-CM | POA: Diagnosis not present

## 2021-03-01 DIAGNOSIS — F10231 Alcohol dependence with withdrawal delirium: Secondary | ICD-10-CM | POA: Diagnosis not present

## 2021-03-01 LAB — COMPREHENSIVE METABOLIC PANEL
ALT: 68 U/L — ABNORMAL HIGH (ref 0–44)
AST: 181 U/L — ABNORMAL HIGH (ref 15–41)
Albumin: 3.9 g/dL (ref 3.5–5.0)
Alkaline Phosphatase: 76 U/L (ref 38–126)
Anion gap: 12 (ref 5–15)
BUN: 6 mg/dL (ref 6–20)
CO2: 20 mmol/L — ABNORMAL LOW (ref 22–32)
Calcium: 9.2 mg/dL (ref 8.9–10.3)
Chloride: 109 mmol/L (ref 98–111)
Creatinine, Ser: 0.65 mg/dL (ref 0.44–1.00)
GFR, Estimated: >60 mL/min (ref >60)
Glucose, Bld: 68 mg/dL — ABNORMAL LOW (ref 70–99)
Potassium: 3.8 mmol/L (ref 3.5–5.1)
Sodium: 141 mmol/L (ref 135–145)
Total Bilirubin: 1.3 mg/dL — ABNORMAL HIGH (ref 0.3–1.2)
Total Protein: 6.7 g/dL (ref 6.5–8.1)

## 2021-03-01 LAB — PHOSPHORUS: Phosphorus: 4.1 mg/dL (ref 2.5–4.6)

## 2021-03-01 LAB — CBC
HCT: 38 % (ref 36.0–46.0)
Hemoglobin: 12.3 g/dL (ref 12.0–15.0)
MCH: 34.6 pg — ABNORMAL HIGH (ref 26.0–34.0)
MCHC: 32.4 g/dL (ref 30.0–36.0)
MCV: 106.7 fL — ABNORMAL HIGH (ref 80.0–100.0)
Platelets: 107 10*3/uL — ABNORMAL LOW (ref 150–400)
RBC: 3.56 MIL/uL — ABNORMAL LOW (ref 3.87–5.11)
RDW: 15.6 % — ABNORMAL HIGH (ref 11.5–15.5)
WBC: 4.7 10*3/uL (ref 4.0–10.5)
nRBC: 0 % (ref 0.0–0.2)

## 2021-03-01 LAB — LIPASE, BLOOD: Lipase: 22 U/L (ref 11–51)

## 2021-03-01 LAB — MAGNESIUM: Magnesium: 2 mg/dL (ref 1.7–2.4)

## 2021-03-01 MED ORDER — IBUPROFEN 200 MG PO TABS
200.0000 mg | ORAL_TABLET | Freq: Three times a day (TID) | ORAL | Status: DC | PRN
  Administered 2021-03-01: 400 mg via ORAL
  Filled 2021-03-01: qty 2

## 2021-03-01 MED ORDER — MAGNESIUM OXIDE -MG SUPPLEMENT 400 (240 MG) MG PO TABS
400.0000 mg | ORAL_TABLET | Freq: Two times a day (BID) | ORAL | Status: AC
  Administered 2021-03-01 (×2): 400 mg via ORAL
  Filled 2021-03-01 (×2): qty 1

## 2021-03-01 MED ORDER — SODIUM CHLORIDE 0.9 % IV SOLN: INTRAVENOUS | Status: AC

## 2021-03-01 MED ORDER — PANTOPRAZOLE SODIUM 40 MG PO TBEC
40.0000 mg | DELAYED_RELEASE_TABLET | Freq: Two times a day (BID) | ORAL | Status: DC
  Administered 2021-03-01 – 2021-03-02 (×2): 40 mg via ORAL
  Filled 2021-03-01 (×3): qty 1

## 2021-03-01 MED ORDER — POTASSIUM CHLORIDE CRYS ER 20 MEQ PO TBCR
40.0000 meq | EXTENDED_RELEASE_TABLET | Freq: Two times a day (BID) | ORAL | Status: AC
  Administered 2021-03-01 (×2): 40 meq via ORAL
  Filled 2021-03-01 (×2): qty 2

## 2021-03-01 NOTE — Progress Notes (Signed)
   NAME:  Natasha Pope, MRN:  [id number], DOB:  1995-09-15, LOS: 1 ADMISSION DATE:  02/27/2021, CONSULTATION DATE:  02/28/2021 REFERRING MD:  Dr Alanda Slim, CHIEF COMPLAINT:  Alcohol withdrawal   History of Present Illness:  25 year old lady with a history of alcohol abuse, anxiety, depression admitted with alcohol withdrawal Fall prior to coming into the hospital CT head without acute finding CIWA score elevated -Fall this morning -Concern for worsening and possible transfer to stepdown unit for further close monitoring on Precedex  Pertinent  Medical History   Past Medical History:  Diagnosis Date   Anxiety    Asthma    Depression    Pyelonephritis    Seizure-like activity (HCC)    Sepsis (HCC)    Significant Hospital Events: Including procedures, antibiotic start and stop dates in addition to other pertinent events   Elevated CIWA score  Interim History / Subjective:  Remains on Precedex at 0.6. Per sitter, she was awake and interactive earlier but just fell back asleep now.  Objective   Blood pressure (!) 144/107, pulse 71, temperature 97.7 F (36.5 C), temperature source Axillary, resp. rate 15, height 5\' 6"  (1.676 m), weight 77.6 kg, SpO2 98 %.        Intake/Output Summary (Last 24 hours) at 03/01/2021 0735 Last data filed at 03/01/2021 05/01/2021 Gross per 24 hour  Intake 2097.06 ml  Output 700 ml  Net 1397.06 ml    Filed Weights   02/27/21 2042  Weight: 77.6 kg    Examination: General: Young lady, sleeping comfortably HENT: MM moist.  Ecchymosis to left eyelid after fall Lungs: Clear breath sounds Cardiovascular: RRR Abdomen: Bowel sounds appreciated Extremities: No clubbing, no edema Neuro: Per safety sitter, was moving all extremities and interacting  GU: Fair  [facility] Problem list     Assessment & Plan:   Severe alcohol withdrawal - remains on Precedex. - Continue Precedex, start to wean to off. - CIWA protocol PRN.  History of seizure  disorder - Unclear if this is alcohol related. - Continue home Zonisamide.  History of depression and anxiety. - Continue home meds.  Elevated LFTs, hyperbilirubinemia - 2/2 EtOH. - Supportive care.  CC time: 30 min.   [facility] For pager details, please see AMION or use Epic chat  After 1900, please call [facility] for cross coverage needs 03/01/2021, 7:40 AM

## 2021-03-01 NOTE — Progress Notes (Signed)
eLink Physician-Brief Progress Note Patient Name: Natasha Pope [place] DOB: 06-13-1996 MRN: [id number]   Date of Service  03/01/2021  HPI/Events of Note  ETOH withdrawal - Nursing request to renew safety sitter.   eICU Interventions  Plan: Will renew safety sitter X 12 hours.     Intervention Category Major Interventions: Delirium, psychosis, severe agitation - evaluation and management  Mataya Kilduff [place] 03/01/2021, 6:00 AM

## 2021-03-01 NOTE — TOC Initial Note (Signed)
Transition of Care [facility]) - Initial/Assessment Note    Patient Details  Name: Natasha Pope MRN: [id number] Date of Birth: Feb 17, 1996  Transition of Care [facility]) CM/SW Contact:    Golda Acre, RN Phone Number: 03/01/2021, 8:15 AM  Clinical Narrative:                 History of Present Illness:  25 year old lady with a history of alcohol abuse, anxiety, depression admitted with alcohol withdrawal Fall prior to coming into the hospital CT head without acute finding CIWA score elevated -Fall this morning -Concern for worsening and possible transfer to stepdown unit for further close monitoring on Precedex   Pertinent  Medical History        Past Medical History:  Diagnosis Date   Anxiety     Asthma     Depression     Pyelonephritis     Seizure-like activity (HCC)     Sepsis (HCC)      Significant Hospital Events: Including procedures, antibiotic start and stop dates in addition to other pertinent events  Elevated CIWA score   Interim History / Subjective:  Remains on Precedex at 0.6. Per sitter, she was awake and interactive earlier but just fell back asleep now. TOC PLAN OF CARE: Will need substance abuse resources when stable and alert on iv precedex and iv ativan at this time. Following for progression Expected Discharge Plan: Home/Self Care Barriers to Discharge: Continued Medical Work up   Patient Goals and CMS Choice Patient states their goals for this hospitalization and ongoing recovery are:: unable to state CMS Medicare.gov Compare Post Acute Care list provided to:: Patient    Expected Discharge Plan and Services Expected Discharge Plan: Home/Self Care   Discharge Planning Services: CM Consult   Living arrangements for the past 2 months: Hotel/Motel                                      Prior Living Arrangements/Services Living arrangements for the past 2 months: Hotel/Motel Lives with:: Self Patient language and need for  interpreter reviewed:: Yes              Criminal Activity/Legal Involvement Pertinent to Current Situation/Hospitalization: No - Comment as needed  Activities of Daily Living Home Assistive Devices/Equipment: Eyeglasses ADL Screening (condition at time of admission) Patient's cognitive ability adequate to safely complete daily activities?: No Is the patient deaf or have difficulty hearing?: No Does the patient have difficulty seeing, even when wearing glasses/contacts?: No Does the patient have difficulty concentrating, remembering, or making decisions?: Yes Patient able to express need for assistance with ADLs?: Yes Does the patient have difficulty dressing or bathing?: No Independently performs ADLs?: No Communication: Independent Dressing (OT): Needs assistance Is this a change from baseline?: Change from baseline, expected to last >3 days Grooming: Independent Feeding: Needs assistance Is this a change from baseline?: Change from baseline, expected to last >3 days Bathing: Needs assistance Is this a change from baseline?: Change from baseline, expected to last >3 days Toileting: Needs assistance Is this a change from baseline?: Change from baseline, expected to last >3days In/Out Bed: Needs assistance Is this a change from baseline?: Change from baseline, expected to last >3 days Walks in Home: Needs assistance Is this a change from baseline?: Change from baseline, expected to last >3 days Does the patient have difficulty walking or climbing stairs?: Yes (secondary to weakness and  tremors) Weakness of Legs: Both Weakness of Arms/Hands: None  Permission Sought/Granted                  Emotional Assessment Appearance:: Appears stated age     Orientation: : Fluctuating Orientation (Suspected and/or reported Sundowners) Alcohol / Substance Use: Alcohol Use Psych Involvement: No (comment)  Admission diagnosis:  Alcohol withdrawal delirium (HCC) [F10.231] Alcohol  withdrawal (HCC) [F10.239] Hypokalemia [E87.6] Severe alcohol withdrawal with perceptual disturbances (HCC) [F10.232] Patient Active Problem List   Diagnosis Date Noted   Hypokalemia 02/28/2021   Alcohol withdrawal delirium (HCC) 02/28/2021   Auditory hallucinations 01/24/2021   Visual hallucination 01/24/2021   Chronic abdominal pain 01/24/2021   Transient alteration of awareness 01/24/2021   Seizures (HCC) 10/03/2020   Depression 10/03/2020   Alcohol use 10/03/2020   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   Delusional disorder (HCC)    Cocaine use disorder, moderate, in early remission (HCC) 08/13/2019   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/2019   Alcohol intoxication with moderate or severe use disorder (HCC) 01/16/2019   Alcohol withdrawal (HCC) 01/10/2019   Alcohol use disorder, severe, in early remission, dependence (HCC) 12/27/2018   Recurrent major depressive disorder (HCC) 12/27/2018   Seizure (HCC) 12/26/2018   Alcohol use with alcohol-induced mood disorder (HCC)    Substance induced mood disorder (HCC) 12/09/2018   Alcohol abuse 12/09/2018   Macrocytic anemia 05/19/2018   Thrombocytopenia (HCC) 05/19/2018   ASCUS of cervix with negative high risk HPV 03/07/2018   Tobacco abuse 01/31/2018   Asthma 01/31/2018   GAD (generalized anxiety disorder) 01/31/2018   PCP:  Janeece Agee, NP Pharmacy:   [facility] [address] [place] - [address] [place] [address] Phone: [phone] Fax: [phone]     Social Determinants of Health (SDOH) Interventions    Readmission Risk Interventions Readmission Risk Prevention Plan 01/12/2019  Transportation Screening Complete  Medication Review (RN Care Manager) Complete  PCP or Specialist appointment within 3-5 days of discharge Not Complete  PCP/Specialist Appt Not Complete comments not ready for d/c, will need inpatient psych  HRI or Home Care Consult Complete   SW Recovery Care/Counseling Consult Complete  Palliative Care Screening Not Applicable  Skilled Nursing Facility Not Applicable  Some recent data might be hidden

## 2021-03-01 NOTE — Telephone Encounter (Signed)
Patient is in the hospital.  Grandmother would like you to call her to discuss getting her into rehab

## 2021-03-01 NOTE — Progress Notes (Signed)
[facility] PROGRESS NOTE    Progress Note  [name]  WUJ:[id number] DOB: Feb 05, 1996 DOA: 02/27/2021 PCP: Janeece Agee, NP     Brief Narrative:   Natasha Pope is an 25 y.o. female past medical history of alcohol abuse anxiety depression incision presents with a laceration and a black eye after a fall from alcohol withdrawal in the ED CT scan of the head and C-spine showed no acute findings she was started on CIWA protocol  Assessment/Plan:   Severe Alcohol withdrawal (HCC): Started on CIWA protocol and Precedex now her heart rate has improved blood pressure is also improved. Monitoring replete electrolytes try to keep potassium greater than 4 magnesium greater than 2 phosphorus greater than 3. Symptom improve we will try to wean off Precedex drip  Accidental fall in hospital: No focal neurodeficits.  History of seizure: Might be alcohol related. Continue with current medication.  History of anxiety and depression: Continue current home medications.  Facial laceration/left black eye: Received tetanus shot in the ED suture removal in 4 to 5 days.  Electrolyte imbalance: Replete electrolytes as needed try to keep potassium greater than 4 phosphorus greater than 3 magnesium greater than 2.  Hypovolemic hyponatremia: Resolved with IV fluid hydration.   DVT prophylaxis: lovenox Family Communication:none Status is: Inpatient  Remains inpatient appropriate because:Hemodynamically unstable  Dispo: The patient is from: Home              Anticipated d/c is to: Home              Patient currently is medically stable to d/c.   Difficult to place patient Yes     Code Status:     Code Status Orders  (From admission, onward)           Start     Ordered   02/28/21 0454  Full code  Continuous        02/28/21 0453           Code Status History     Date Active Date Inactive Code Status Order ID Comments User Context   01/12/2020  0105 01/12/2020 1446 Full Code [id number]  Jackelyn Poling, NP ED   01/10/2020 1737 01/11/2020 2255 Full Code [id number]  Cathren Laine, MD ED   05/02/2019 2333 05/03/2019 1526 Full Code [id number]  Long, Arlyss Repress, MD ED   01/16/2019 2245 01/20/2019 1644 Full Code [id number]  Laveda Abbe, NP Inpatient   01/16/2019 2245 01/16/2019 2245 Full Code [id number]  Laveda Abbe, NP Inpatient   01/10/2019 2004 01/16/2019 2116 Full Code [id number]  Noralee Stain, DO Inpatient   01/10/2019 1227 01/10/2019 2004 Full Code [id number]  Cherly Anderson, PA-C ED   12/26/2018 1653 12/27/2018 1630 Full Code [id number]  Rhetta Mura, MD ED   12/15/2018 1817 12/18/2018 1500 Full Code [id number]  Carron Curie, MD Inpatient   12/12/2018 2027 12/13/2018 1850 Full Code [id number]  Karrie Meres, PA-C ED   12/08/2018 2233 12/09/2018 1421 Full Code [id number]  Garlon Hatchet, PA-C ED   05/19/2018 2101 05/23/2018 2041 Full Code [id number]  Lorretta Harp, MD ED         IV Access:   Peripheral IV   Procedures and diagnostic studies:   CT HEAD WO CONTRAST ( )  Result Date: 02/27/2021 CLINICAL DATA:  Head trauma, minor, normal mental status (Age 30-64y); Neck trauma, uncomplicated (NEXUS/CCR neg) (Age 47-64y); Maxillofacial pain Laceration to left eyelid.  Forehead hematoma. EXAM: CT HEAD WITHOUT CONTRAST CT  MAXILLOFACIAL WITHOUT CONTRAST CT CERVICAL SPINE WITHOUT CONTRAST TECHNIQUE: Multidetector CT imaging of the head, cervical spine, and maxillofacial structures were performed using the standard protocol without intravenous contrast. Multiplanar CT image reconstructions of the cervical spine and maxillofacial structures were also generated. Please note all images for the head, face, and cervical spine CT or under the maxillofacial accession in PACS. COMPARISON:  Head CT 12/26/2018 FINDINGS: CT HEAD FINDINGS Brain: No intracranial hemorrhage, mass effect, or midline shift. No hydrocephalus. The basilar cisterns are  patent. No evidence of territorial infarct or acute ischemia. No extra-axial or intracranial fluid collection. Vascular: No hyperdense vessel or unexpected calcification. Skull: Normal. Negative for fracture or focal lesion. Other: None. CT MAXILLOFACIAL FINDINGS Osseous: Nasal bone, zygomatic arches, and mandibles are intact. The temporomandibular joints are congruent. Minimal rightward nasal septal deviation intact teeth in maxilla. No fracture of pterygoid plates. Orbits: No orbital fracture or globe injury. Sinuses: No sinus fracture or hemosinus. No sinus fluid level. 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 Soft tissues: Few prominent submental lymph nodes are likely reactive. Minimal left supraorbital soft tissue thickening. CT CERVICAL SPINE FINDINGS Alignment: Mild broad-based reversal of normal lordosis. No traumatic subluxation. Skull base and vertebrae: No acute fracture. Vertebral body heights are maintained. The dens and skull base are intact. Soft tissues and spinal canal: No prevertebral fluid or swelling. No visible canal hematoma. Disc levels: Minor endplate spurring at C5-C6 with minimal disc space narrowing. Upper chest: No acute or unexpected findings. Other: None. IMPRESSION: 1. No acute intracranial abnormality. No skull fracture. 2. No facial bone fracture. Minimal left supraorbital soft tissue thickening. 3. No fracture or subluxation of the cervical spine. Mild broad-based reversal of normal cervical lordosis may be positioning or muscle spasm. Electronically Signed   By: Narda Rutherford M.D.   On: 02/27/2021 22:47   CT Cervical Spine Wo Contrast  Result Date: 02/27/2021 CLINICAL DATA:  Head trauma, minor, normal mental status (Age 39-64y); Neck trauma, uncomplicated (NEXUS/CCR neg) (Age 78-64y); Maxillofacial pain Laceration to left eyelid.  Forehead hematoma. EXAM: CT HEAD WITHOUT CONTRAST CT MAXILLOFACIAL WITHOUT CONTRAST CT CERVICAL SPINE WITHOUT CONTRAST TECHNIQUE:  Multidetector CT imaging of the head, cervical spine, and maxillofacial structures were performed using the standard protocol without intravenous contrast. Multiplanar CT image reconstructions of the cervical spine and maxillofacial structures were also generated. Please note all images for the head, face, and cervical spine CT or under the maxillofacial accession in PACS. COMPARISON:  Head CT 12/26/2018 FINDINGS: CT HEAD FINDINGS Brain: No intracranial hemorrhage, mass effect, or midline shift. No hydrocephalus. The basilar cisterns are patent. No evidence of territorial infarct or acute ischemia. No extra-axial or intracranial fluid collection. Vascular: No hyperdense vessel or unexpected calcification. Skull: Normal. Negative for fracture or focal lesion. Other: None. CT MAXILLOFACIAL FINDINGS Osseous: Nasal bone, zygomatic arches, and mandibles are intact. The temporomandibular joints are congruent. Minimal rightward nasal septal deviation intact teeth in maxilla. No fracture of pterygoid plates. Orbits: No orbital fracture or globe injury. Sinuses: No sinus fracture or hemosinus. No sinus fluid level. 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 Soft tissues: Few prominent submental lymph nodes are likely reactive. Minimal left supraorbital soft tissue thickening. CT CERVICAL SPINE FINDINGS Alignment: Mild broad-based reversal of normal lordosis. No traumatic subluxation. Skull base and vertebrae: No acute fracture. Vertebral body heights are maintained. The dens and skull base are intact. Soft tissues and spinal canal: No prevertebral fluid or swelling. No visible canal hematoma. Disc levels: Minor endplate spurring at C5-C6  with minimal disc space narrowing. Upper chest: No acute or unexpected findings. Other: None. IMPRESSION: 1. No acute intracranial abnormality. No skull fracture. 2. No facial bone fracture. Minimal left supraorbital soft tissue thickening. 3. No fracture or subluxation of the  cervical spine. Mild broad-based reversal of normal cervical lordosis may be positioning or muscle spasm. Electronically Signed   By: Narda Rutherford M.D.   On: 02/27/2021 22:47   CT Maxillofacial Wo Contrast  Result Date: 02/27/2021 CLINICAL DATA:  Head trauma, minor, normal mental status (Age 42-64y); Neck trauma, uncomplicated (NEXUS/CCR neg) (Age 74-64y); Maxillofacial pain Laceration to left eyelid.  Forehead hematoma. EXAM: CT HEAD WITHOUT CONTRAST CT MAXILLOFACIAL WITHOUT CONTRAST CT CERVICAL SPINE WITHOUT CONTRAST TECHNIQUE: Multidetector CT imaging of the head, cervical spine, and maxillofacial structures were performed using the standard protocol without intravenous contrast. Multiplanar CT image reconstructions of the cervical spine and maxillofacial structures were also generated. Please note all images for the head, face, and cervical spine CT or under the maxillofacial accession in PACS. COMPARISON:  Head CT 12/26/2018 FINDINGS: CT HEAD FINDINGS Brain: No intracranial hemorrhage, mass effect, or midline shift. No hydrocephalus. The basilar cisterns are patent. No evidence of territorial infarct or acute ischemia. No extra-axial or intracranial fluid collection. Vascular: No hyperdense vessel or unexpected calcification. Skull: Normal. Negative for fracture or focal lesion. Other: None. CT MAXILLOFACIAL FINDINGS Osseous: Nasal bone, zygomatic arches, and mandibles are intact. The temporomandibular joints are congruent. Minimal rightward nasal septal deviation intact teeth in maxilla. No fracture of pterygoid plates. Orbits: No orbital fracture or globe injury. Sinuses: No sinus fracture or hemosinus. No sinus fluid level. 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 Soft tissues: Few prominent submental lymph nodes are likely reactive. Minimal left supraorbital soft tissue thickening. CT CERVICAL SPINE FINDINGS Alignment: Mild broad-based reversal of normal lordosis. No traumatic subluxation.  Skull base and vertebrae: No acute fracture. Vertebral body heights are maintained. The dens and skull base are intact. Soft tissues and spinal canal: No prevertebral fluid or swelling. No visible canal hematoma. Disc levels: Minor endplate spurring at C5-C6 with minimal disc space narrowing. Upper chest: No acute or unexpected findings. Other: None. IMPRESSION: 1. No acute intracranial abnormality. No skull fracture. 2. No facial bone fracture. Minimal left supraorbital soft tissue thickening. 3. No fracture or subluxation of the cervical spine. Mild broad-based reversal of normal cervical lordosis may be positioning or muscle spasm. Electronically Signed   By: Narda Rutherford M.D.   On: 02/27/2021 22:47     Medical Consultants:   None.   Subjective:    [name] hard to wake up she relates she is hungry.  Objective:    Vitals:   03/01/21 0200 03/01/21 0300 03/01/21 0400 03/01/21 0500  BP: (!) 144/101 (!) 139/98 (!) 134/92 (!) 144/107  Pulse: 68 89 77 71  Resp: 13 18 16 15   Temp:      TempSrc:      SpO2: 99% 100% 96% 98%  Weight:      Height:       SpO2: 98 %   Intake/Output Summary (Last 24 hours) at 03/01/2021 0644 Last data filed at 03/01/2021 05/01/2021 Gross per 24 hour  Intake 2097.06 ml  Output 700 ml  Net 1397.06 ml   Filed Weights   02/27/21 2042  Weight: 77.6 kg    Exam: General exam: In no acute distress. Respiratory system: Good air movement and clear to auscultation. Cardiovascular system: S1 & S2 heard, RRR. No JVD. Gastrointestinal  system: Abdomen is nondistended, soft and nontender.  Extremities: No pedal edema. Skin: No rashes, lesions or ulcers Psychiatry: Judgement and insight appear normal. Mood & affect appropriate.    Data Reviewed:    Labs: Basic Metabolic Panel: Recent Labs  Lab 02/27/21 2133 02/28/21 0808  NA 134* 134*  K 2.8* 3.5  CL 93* 97*  CO2 25 22  GLUCOSE 108* 88  BUN 7 6  CREATININE 0.68 0.73  CALCIUM 10.3  10.2  MG 1.9 1.8  PHOS 2.9 2.7   GFR Estimated Creatinine Clearance: 114 mL/min (by C-G formula based on SCr of 0.73 mg/dL). Liver Function Tests: Recent Labs  Lab 02/27/21 2133 02/28/21 0808  AST 116* 196*  ALT 58* 61*  ALKPHOS 110 91  BILITOT 1.8* 1.6*  PROT 9.0* 7.8  ALBUMIN 5.2* 4.8   No results for input(s): LIPASE, AMYLASE in the last 168 hours. No results for input(s): AMMONIA in the last 168 hours. Coagulation profile No results for input(s): INR, PROTIME in the last 168 hours. COVID-19 Labs  No results for input(s): DDIMER, FERRITIN, LDH, CRP in the last 72 hours.  Lab Results  Component Value Date   SARSCOV2NAA NEGATIVE 02/28/2021   SARSCOV2NAA NEGATIVE 01/10/2020   SARSCOV2NAA NEGATIVE 05/03/2019   SARSCOV2NAA NEGATIVE 01/10/2019    CBC: Recent Labs  Lab 02/27/21 2133  WBC 6.8  HGB 14.4  HCT 41.7  MCV 100.0  PLT 150   Cardiac Enzymes: Recent Labs  Lab 02/28/21 1142  CKTOTAL 196   BNP (last 3 results) No results for input(s): PROBNP in the last 8760 hours. CBG: No results for input(s): GLUCAP in the last 168 hours. D-Dimer: No results for input(s): DDIMER in the last 72 hours. Hgb A1c: No results for input(s): HGBA1C in the last 72 hours. Lipid Profile: No results for input(s): CHOL, HDL, LDLCALC, TRIG, CHOLHDL, LDLDIRECT in the last 72 hours. Thyroid function studies: No results for input(s): TSH, T4TOTAL, T3FREE, THYROIDAB in the last 72 hours.  Invalid input(s): FREET3 Anemia work up: No results for input(s): VITAMINB12, FOLATE, FERRITIN, TIBC, IRON, RETICCTPCT in the last 72 hours. Sepsis Labs: Recent Labs  Lab 02/27/21 2133  WBC 6.8   Microbiology Recent Results (from the past 240 hour(s))  Resp Panel by RT-PCR (Flu A&B, Covid) Nasopharyngeal Swab     Status: None   Collection Time: 02/28/21  4:29 AM   Specimen: Nasopharyngeal Swab; Nasopharyngeal(NP) swabs in vial transport medium  Result Value Ref Range Status   SARS  Coronavirus 2 by RT PCR NEGATIVE NEGATIVE Final    Comment: (NOTE) SARS-CoV-2 target nucleic acids are NOT DETECTED.  The SARS-CoV-2 RNA is generally detectable in upper respiratory specimens during the acute phase of infection. The lowest concentration of SARS-CoV-2 viral copies this assay can detect is 138 copies/mL. A negative result does not preclude SARS-Cov-2 infection and should not be used as the sole basis for treatment or other patient management decisions. A negative result may occur with  improper specimen collection/handling, submission of specimen other than nasopharyngeal swab, presence of viral mutation(s) within the areas targeted by this assay, and inadequate number of viral copies(<138 copies/mL). A negative result must be combined with clinical observations, patient history, and epidemiological information. The expected result is Negative.  Fact Sheet for Patients:  BloggerCourse.com  Fact Sheet for Healthcare Providers:  SeriousBroker.it  This test is no t yet approved or cleared by the [place] FDA and  has been authorized for detection and/or diagnosis of SARS-CoV-2 by  FDA under an Emergency Use Authorization (EUA). This EUA will remain  in effect (meaning this test can be used) for the duration of the COVID-19 declaration under Section 564(b)(1) of the Act, 21 U.S.C.section 360bbb-3(b)(1), unless the authorization is terminated  or revoked sooner.       Influenza A by PCR NEGATIVE NEGATIVE Final   Influenza B by PCR NEGATIVE NEGATIVE Final    Comment: (NOTE) The Xpert Xpress SARS-CoV-2/FLU/RSV plus assay is intended as an aid in the diagnosis of influenza from Nasopharyngeal swab specimens and should not be used as a sole basis for treatment. Nasal washings and aspirates are unacceptable for Xpert Xpress SARS-CoV-2/FLU/RSV testing.  Fact Sheet for  Patients: BloggerCourse.comhttps://www.fda.gov/media/152166/download  Fact Sheet for Healthcare Providers: SeriousBroker.ithttps://www.fda.gov/media/152162/download  This test is not yet approved or cleared by the [place] FDA and has been authorized for detection and/or diagnosis of SARS-CoV-2 by FDA under an Emergency Use Authorization (EUA). This EUA will remain in effect (meaning this test can be used) for the duration of the COVID-19 declaration under Section 564(b)(1) of the Act, 21 U.S.C. section 360bbb-3(b)(1), unless the authorization is terminated or revoked.  Performed at [facility], [address] [place], [place] [address]   MRSA Next Gen by PCR, Nasal     Status: None   Collection Time: 02/28/21  1:06 PM   Specimen: Nasal Mucosa; Nasal Swab  Result Value Ref Range Status   MRSA by PCR Next Gen NOT DETECTED NOT DETECTED Final    Comment: (NOTE) The GeneXpert MRSA Assay (FDA approved for NASAL specimens only), is one component of a comprehensive MRSA colonization surveillance program. It is not intended to diagnose MRSA infection nor to guide or monitor treatment for MRSA infections. Test performance is not FDA approved in patients less than 24 years old. Performed at [facility], [address] [place], [place] [address]      Medications:    buPROPion  150 mg Oral BID   Chlorhexidine Gluconate Cloth  6 each Topical Daily   DULoxetine  60 mg Oral Daily   folic acid  1 mg Oral Daily   gabapentin  100 mg Oral BID   LORazepam  0-4 mg Intravenous Q4H   Followed by   Melene Muller[START ON 03/02/2021] LORazepam  0-4 mg Intravenous Q8H   mouth rinse  15 mL Mouth Rinse BID   multivitamin with minerals  1 tablet Oral Daily   potassium chloride  40 mEq Oral Once   thiamine  100 mg Oral Daily   Or   thiamine  100 mg Intravenous Daily   zonisamide  100 mg Oral BID   Continuous Infusions:  0.9 % NaCl with KCl 20 mEq / L 100 mL/hr at 03/01/21 0500    dexmedetomidine (PRECEDEX) IV infusion 0.6 mcg/kg/hr (03/01/21 0500)      LOS: 1 day   Marinda ElkAbraham Feliz Ortiz  [facility]  03/01/2021, 6:44 AM

## 2021-03-02 LAB — BASIC METABOLIC PANEL
Anion gap: 13 (ref 5–15)
BUN: 6 mg/dL (ref 6–20)
CO2: 20 mmol/L — ABNORMAL LOW (ref 22–32)
Calcium: 9.9 mg/dL (ref 8.9–10.3)
Chloride: 105 mmol/L (ref 98–111)
Creatinine, Ser: 0.6 mg/dL (ref 0.44–1.00)
GFR, Estimated: >60 mL/min (ref >60)
Glucose, Bld: 116 mg/dL — ABNORMAL HIGH (ref 70–99)
Potassium: 3.6 mmol/L (ref 3.5–5.1)
Sodium: 138 mmol/L (ref 135–145)

## 2021-03-02 NOTE — Progress Notes (Signed)
PCCM Brief Note  Off Precedex yesterday 8/10.  Transferred to floor. Stable. Continue current management per [facility]. Please call back if we can be of further assistance.   Rutherford Guys, PA - [facility] For pager details, please see AMION or use Epic chat  After 1900, please call [facility] for cross coverage needs 03/02/2021, 7:09 AM

## 2021-03-02 NOTE — Discharge Summary (Signed)
Physician Discharge Summary  Natasha Pope [place] YHC:[id number] DOB: 1996-04-08 DOA: 02/27/2021  PCP: Janeece Agee, NP  Admit date: 02/27/2021 Discharge date: 03/02/2021  Admitted From: Home Disposition:  Home  Recommendations for Outpatient Follow-up:  Follow up with PCP in 1-2 weeks Please obtain BMP/CBC in one week   Home Health:No Equipment/Devices:none  Discharge Condition:Stable CODE STATUS:Full Diet recommendation: Heart Healthy   Brief/Interim Summary:  25 y.o. female past medical history of alcohol abuse anxiety depression incision presents with a laceration and a black eye after a fall from alcohol withdrawal in the ED CT scan of the head and C-spine showed no acute findings she was started on CIWA protocol  Discharge Diagnoses:  Principal Problem:   Alcohol withdrawal (HCC) Active Problems:   Seizure (HCC)   Hypokalemia   Alcohol withdrawal delirium (HCC)  Severe alcohol withdrawal: She was started on CIWA protocol she remained agitated and hyperactive she was started on Precedex heart rate and blood pressure improved she was weaned off Precedex and monitor. She tolerated her diet her electrolytes were Imbalanced.  Sitting till fall in the hospital: No focal deficits.  History of seizure disorder: Might be alcohol related no change made to her medication she had no seizures in house.  History of anxiety and depression: Change made to her medication continue current regimen.  Facial laceration/left black eye: She is to have her sutures removed 4 to 5 days.  Electrolyte imbalance: To replete orally now resolved her magnesium was kept above 2 and potassium greater than 4.  Hypovolemic hyponatremia: Resolved with IV fluid hydration.  Discharge Instructions  Discharge Instructions     Diet - low sodium heart healthy   Complete by: As directed    Increase activity slowly   Complete by: As directed    No wound care   Complete by: As directed        Allergies as of 03/02/2021       Reactions   Lamotrigine Hives        Medication List     TAKE these medications    buPROPion 150 MG 12 hr tablet Commonly known as: Wellbutrin SR Take 1 tablet (150 mg total) by mouth 2 (two) times daily.   cetirizine 10 MG tablet Commonly known as: ZYRTEC Take 1 tablet (10 mg total) by mouth daily.   clonazePAM 0.5 MG tablet Commonly known as: KLONOPIN Take 1 tablet (0.5 mg total) by mouth 2 (two) times daily as needed for anxiety.   DULoxetine 60 MG capsule Commonly known as: CYMBALTA TAKE ONE CAPSULE BY MOUTH ONE TIME DAILY   gabapentin 100 MG capsule Commonly known as: NEURONTIN TAKE ONE CAPSULE BY MOUTH TWICE DAILY   hydrOXYzine 25 MG tablet Commonly known as: ATARAX/VISTARIL TAKE ONE TABLET BY MOUTH THREE TIMES DAILY   ibuprofen 200 MG tablet Commonly known as: ADVIL Take 1-2 tablets (200-400 mg total) by mouth 3 (three) times daily as needed for headache or moderate pain.   methocarbamol 750 MG tablet Commonly known as: ROBAXIN TAKE ONE TABLET BY MOUTH EVERY EIGHT HOURS AS NEEDED FOR MUSCLE SPASM   naltrexone 50 MG tablet Commonly known as: DEPADE Take 1 tablet (50 mg total) by mouth daily.   norethindrone-ethinyl estradiol-FE 1-20 MG-MCG tablet Commonly known as: Hailey FE 1/20 Take 1 tablet by mouth daily.   ondansetron 4 MG disintegrating tablet Commonly known as: ZOFRAN-ODT Take 1 tablet (4 mg total) by mouth every 8 (eight) hours as needed for nausea or vomiting.   potassium  chloride 10 MEQ tablet Commonly known as: KLOR-CON Take 1 tablet (10 mEq total) by mouth daily.   triamcinolone cream 0.1 % Commonly known as: KENALOG Apply to affected area 1-2 times daily   zonisamide 100 MG capsule Commonly known as: ZONEGRAN Take 1 capsule (100 mg total) by mouth 2 (two) times daily.        Allergies  Allergen Reactions   Lamotrigine Hives    Consultations: Pulmonary and critical  care   Procedures/Studies: CT HEAD WO CONTRAST ( )  Result Date: 02/27/2021 CLINICAL DATA:  Head trauma, minor, normal mental status (Age 62-64y); Neck trauma, uncomplicated (NEXUS/CCR neg) (Age 51-64y); Maxillofacial pain Laceration to left eyelid.  Forehead hematoma. EXAM: CT HEAD WITHOUT CONTRAST CT MAXILLOFACIAL WITHOUT CONTRAST CT CERVICAL SPINE WITHOUT CONTRAST TECHNIQUE: Multidetector CT imaging of the head, cervical spine, and maxillofacial structures were performed using the standard protocol without intravenous contrast. Multiplanar CT image reconstructions of the cervical spine and maxillofacial structures were also generated. Please note all images for the head, face, and cervical spine CT or under the maxillofacial accession in PACS. COMPARISON:  Head CT 12/26/2018 FINDINGS: CT HEAD FINDINGS Brain: No intracranial hemorrhage, mass effect, or midline shift. No hydrocephalus. The basilar cisterns are patent. No evidence of territorial infarct or acute ischemia. No extra-axial or intracranial fluid collection. Vascular: No hyperdense vessel or unexpected calcification. Skull: Normal. Negative for fracture or focal lesion. Other: None. CT MAXILLOFACIAL FINDINGS Osseous: Nasal bone, zygomatic arches, and mandibles are intact. The temporomandibular joints are congruent. Minimal rightward nasal septal deviation intact teeth in maxilla. No fracture of pterygoid plates. Orbits: No orbital fracture or globe injury. Sinuses: No sinus fracture or hemosinus. No sinus fluid level. 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 Soft tissues: Few prominent submental lymph nodes are likely reactive. Minimal left supraorbital soft tissue thickening. CT CERVICAL SPINE FINDINGS Alignment: Mild broad-based reversal of normal lordosis. No traumatic subluxation. Skull base and vertebrae: No acute fracture. Vertebral body heights are maintained. The dens and skull base are intact. Soft tissues and spinal canal: No  prevertebral fluid or swelling. No visible canal hematoma. Disc levels: Minor endplate spurring at C5-C6 with minimal disc space narrowing. Upper chest: No acute or unexpected findings. Other: None. IMPRESSION: 1. No acute intracranial abnormality. No skull fracture. 2. No facial bone fracture. Minimal left supraorbital soft tissue thickening. 3. No fracture or subluxation of the cervical spine. Mild broad-based reversal of normal cervical lordosis may be positioning or muscle spasm. Electronically Signed   By: Narda Rutherford M.D.   On: 02/27/2021 22:47   CT Cervical Spine Wo Contrast  Result Date: 02/27/2021 CLINICAL DATA:  Head trauma, minor, normal mental status (Age 24-64y); Neck trauma, uncomplicated (NEXUS/CCR neg) (Age 68-64y); Maxillofacial pain Laceration to left eyelid.  Forehead hematoma. EXAM: CT HEAD WITHOUT CONTRAST CT MAXILLOFACIAL WITHOUT CONTRAST CT CERVICAL SPINE WITHOUT CONTRAST TECHNIQUE: Multidetector CT imaging of the head, cervical spine, and maxillofacial structures were performed using the standard protocol without intravenous contrast. Multiplanar CT image reconstructions of the cervical spine and maxillofacial structures were also generated. Please note all images for the head, face, and cervical spine CT or under the maxillofacial accession in PACS. COMPARISON:  Head CT 12/26/2018 FINDINGS: CT HEAD FINDINGS Brain: No intracranial hemorrhage, mass effect, or midline shift. No hydrocephalus. The basilar cisterns are patent. No evidence of territorial infarct or acute ischemia. No extra-axial or intracranial fluid collection. Vascular: No hyperdense vessel or unexpected calcification. Skull: Normal. Negative for fracture or focal lesion. Other: None.  CT MAXILLOFACIAL FINDINGS Osseous: Nasal bone, zygomatic arches, and mandibles are intact. The temporomandibular joints are congruent. Minimal rightward nasal septal deviation intact teeth in maxilla. No fracture of pterygoid plates.  Orbits: No orbital fracture or globe injury. Sinuses: No sinus fracture or hemosinus. No sinus fluid level. 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 Soft tissues: Few prominent submental lymph nodes are likely reactive. Minimal left supraorbital soft tissue thickening. CT CERVICAL SPINE FINDINGS Alignment: Mild broad-based reversal of normal lordosis. No traumatic subluxation. Skull base and vertebrae: No acute fracture. Vertebral body heights are maintained. The dens and skull base are intact. Soft tissues and spinal canal: No prevertebral fluid or swelling. No visible canal hematoma. Disc levels: Minor endplate spurring at C5-C6 with minimal disc space narrowing. Upper chest: No acute or unexpected findings. Other: None. IMPRESSION: 1. No acute intracranial abnormality. No skull fracture. 2. No facial bone fracture. Minimal left supraorbital soft tissue thickening. 3. No fracture or subluxation of the cervical spine. Mild broad-based reversal of normal cervical lordosis may be positioning or muscle spasm. Electronically Signed   By: Narda Rutherford M.D.   On: 02/27/2021 22:47   CT Maxillofacial Wo Contrast  Result Date: 02/27/2021 CLINICAL DATA:  Head trauma, minor, normal mental status (Age 40-64y); Neck trauma, uncomplicated (NEXUS/CCR neg) (Age 16-64y); Maxillofacial pain Laceration to left eyelid.  Forehead hematoma. EXAM: CT HEAD WITHOUT CONTRAST CT MAXILLOFACIAL WITHOUT CONTRAST CT CERVICAL SPINE WITHOUT CONTRAST TECHNIQUE: Multidetector CT imaging of the head, cervical spine, and maxillofacial structures were performed using the standard protocol without intravenous contrast. Multiplanar CT image reconstructions of the cervical spine and maxillofacial structures were also generated. Please note all images for the head, face, and cervical spine CT or under the maxillofacial accession in PACS. COMPARISON:  Head CT 12/26/2018 FINDINGS: CT HEAD FINDINGS Brain: No intracranial hemorrhage, mass  effect, or midline shift. No hydrocephalus. The basilar cisterns are patent. No evidence of territorial infarct or acute ischemia. No extra-axial or intracranial fluid collection. Vascular: No hyperdense vessel or unexpected calcification. Skull: Normal. Negative for fracture or focal lesion. Other: None. CT MAXILLOFACIAL FINDINGS Osseous: Nasal bone, zygomatic arches, and mandibles are intact. The temporomandibular joints are congruent. Minimal rightward nasal septal deviation intact teeth in maxilla. No fracture of pterygoid plates. Orbits: No orbital fracture or globe injury. Sinuses: No sinus fracture or hemosinus. No sinus fluid level. 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 Soft tissues: Few prominent submental lymph nodes are likely reactive. Minimal left supraorbital soft tissue thickening. CT CERVICAL SPINE FINDINGS Alignment: Mild broad-based reversal of normal lordosis. No traumatic subluxation. Skull base and vertebrae: No acute fracture. Vertebral body heights are maintained. The dens and skull base are intact. Soft tissues and spinal canal: No prevertebral fluid or swelling. No visible canal hematoma. Disc levels: Minor endplate spurring at C5-C6 with minimal disc space narrowing. Upper chest: No acute or unexpected findings. Other: None. IMPRESSION: 1. No acute intracranial abnormality. No skull fracture. 2. No facial bone fracture. Minimal left supraorbital soft tissue thickening. 3. No fracture or subluxation of the cervical spine. Mild broad-based reversal of normal cervical lordosis may be positioning or muscle spasm. Electronically Signed   By: Narda Rutherford M.D.   On: 02/27/2021 22:47     Subjective: No complaints  Discharge Exam: Vitals:   03/02/21 0531 03/02/21 0841  BP: (!) 143/108 (!) 136/104  Pulse: (!) 109 99  Resp:    Temp:    SpO2:     Vitals:   03/02/21 0235 03/02/21 0357  03/02/21 0531 03/02/21 0841  BP: (!) 167/111 (!) 149/110 (!) 143/108 (!) 136/104   Pulse: 78 (!) 106 (!) 109 99  Resp: 18     Temp: 98 F (36.7 C)     TempSrc: Oral     SpO2: 97%     Weight:      Height:        General: Pt is alert, awake, not in acute distress Cardiovascular: RRR, S1/S2 +, no rubs, no gallops Respiratory: CTA bilaterally, no wheezing, no rhonchi Abdominal: Soft, NT, ND, bowel sounds + Extremities: no edema, no cyanosis    The results of significant diagnostics from this hospitalization (including imaging, microbiology, ancillary and laboratory) are listed below for reference.     Microbiology: Recent Results (from the past 240 hour(s))  Resp Panel by RT-PCR (Flu A&B, Covid) Nasopharyngeal Swab     Status: None   Collection Time: 02/28/21  4:29 AM   Specimen: Nasopharyngeal Swab; Nasopharyngeal(NP) swabs in vial transport medium  Result Value Ref Range Status   SARS Coronavirus 2 by RT PCR NEGATIVE NEGATIVE Final    Comment: (NOTE) SARS-CoV-2 target nucleic acids are NOT DETECTED.  The SARS-CoV-2 RNA is generally detectable in upper respiratory specimens during the acute phase of infection. The lowest concentration of SARS-CoV-2 viral copies this assay can detect is 138 copies/mL. A negative result does not preclude SARS-Cov-2 infection and should not be used as the sole basis for treatment or other patient management decisions. A negative result may occur with  improper specimen collection/handling, submission of specimen other than nasopharyngeal swab, presence of viral mutation(s) within the areas targeted by this assay, and inadequate number of viral copies(<138 copies/mL). A negative result must be combined with clinical observations, patient history, and epidemiological information. The expected result is Negative.  Fact Sheet for Patients:  BloggerCourse.comhttps://www.fda.gov/media/152166/download  Fact Sheet for Healthcare Providers:  SeriousBroker.ithttps://www.fda.gov/media/152162/download  This test is no t yet approved or cleared by the [place] FDA and  has been authorized for detection and/or diagnosis of SARS-CoV-2 by FDA under an Emergency Use Authorization (EUA). This EUA will remain  in effect (meaning this test can be used) for the duration of the COVID-19 declaration under Section 564(b)(1) of the Act, 21 U.S.C.section 360bbb-3(b)(1), unless the authorization is terminated  or revoked sooner.       Influenza A by PCR NEGATIVE NEGATIVE Final   Influenza B by PCR NEGATIVE NEGATIVE Final    Comment: (NOTE) The Xpert Xpress SARS-CoV-2/FLU/RSV plus assay is intended as an aid in the diagnosis of influenza from Nasopharyngeal swab specimens and should not be used as a sole basis for treatment. Nasal washings and aspirates are unacceptable for Xpert Xpress SARS-CoV-2/FLU/RSV testing.  Fact Sheet for Patients: BloggerCourse.comhttps://www.fda.gov/media/152166/download  Fact Sheet for Healthcare Providers: SeriousBroker.ithttps://www.fda.gov/media/152162/download  This test is not yet approved or cleared by the [place] FDA and has been authorized for detection and/or diagnosis of SARS-CoV-2 by FDA under an Emergency Use Authorization (EUA). This EUA will remain in effect (meaning this test can be used) for the duration of the COVID-19 declaration under Section 564(b)(1) of the Act, 21 U.S.C. section 360bbb-3(b)(1), unless the authorization is terminated or revoked.  Performed at [facility], [address] [place], [place] [id number]   MRSA Next Gen by PCR, Nasal     Status: None   Collection Time: 02/28/21  1:06 PM   Specimen: Nasal Mucosa; Nasal Swab  Result Value Ref Range Status   MRSA by PCR  Next Gen NOT DETECTED NOT DETECTED Final    Comment: (NOTE) The GeneXpert MRSA Assay (FDA approved for NASAL specimens only), is one component of a comprehensive MRSA colonization surveillance program. It is not intended to diagnose MRSA infection nor to guide or monitor treatment for MRSA infections. Test performance is  not FDA approved in patients less than 90 years old. Performed at [facility], [address], [place], [place] [address]      Labs: BNP (last 3 results) No results for input(s): BNP in the last 8760 hours. Basic Metabolic Panel: Recent Labs  Lab 02/27/21 2133 02/28/21 0808 03/01/21 0758 03/02/21 0519  NA 134* 134* 141 138  K 2.8* 3.5 3.8 3.6  CL 93* 97* 109 105  CO2 25 22 20* 20*  GLUCOSE 108* 88 68* 116*  BUN 7 6 6 6   CREATININE 0.68 0.73 0.65 0.60  CALCIUM 10.3 10.2 9.2 9.9  MG 1.9 1.8 2.0  --   PHOS 2.9 2.7 4.1  --    Liver Function Tests: Recent Labs  Lab 02/27/21 2133 02/28/21 0808 03/01/21 0758  AST 116* 196* 181*  ALT 58* 61* 68*  ALKPHOS 110 91 76  BILITOT 1.8* 1.6* 1.3*  PROT 9.0* 7.8 6.7  ALBUMIN 5.2* 4.8 3.9   Recent Labs  Lab 03/01/21 0758  LIPASE 22   No results for input(s): AMMONIA in the last 168 hours. CBC: Recent Labs  Lab 02/27/21 2133 03/01/21 0758  WBC 6.8 4.7  HGB 14.4 12.3  HCT 41.7 38.0  MCV 100.0 106.7*  PLT 150 107*   Cardiac Enzymes: Recent Labs  Lab 02/28/21 1142  CKTOTAL 196   BNP: Invalid input(s): POCBNP CBG: No results for input(s): GLUCAP in the last 168 hours. D-Dimer No results for input(s): DDIMER in the last 72 hours. Hgb A1c No results for input(s): HGBA1C in the last 72 hours. Lipid Profile No results for input(s): CHOL, HDL, LDLCALC, TRIG, CHOLHDL, LDLDIRECT in the last 72 hours. Thyroid function studies No results for input(s): TSH, T4TOTAL, T3FREE, THYROIDAB in the last 72 hours.  Invalid input(s): FREET3 Anemia work up No results for input(s): VITAMINB12, FOLATE, FERRITIN, TIBC, IRON, RETICCTPCT in the last 72 hours. Urinalysis    Component Value Date/Time   COLORURINE YELLOW 12/21/2020 1542   APPEARANCEUR CLEAR 12/21/2020 1542   LABSPEC 1.015 12/21/2020 1542   PHURINE 7.5 12/21/2020 1542   GLUCOSEU NEGATIVE 12/21/2020 1542   HGBUR NEGATIVE 12/21/2020 1542    BILIRUBINUR NEGATIVE 12/21/2020 1542   BILIRUBINUR Negative 12/24/2018 1147   KETONESUR NEGATIVE 12/21/2020 1542   PROTEINUR 30 (A) 12/14/2020 1455   UROBILINOGEN 0.2 12/21/2020 1542   NITRITE NEGATIVE 12/21/2020 1542   LEUKOCYTESUR NEGATIVE 12/21/2020 1542   Sepsis Labs Invalid input(s): PROCALCITONIN,  WBC,  LACTICIDVEN Microbiology Recent Results (from the past 240 hour(s))  Resp Panel by RT-PCR (Flu A&B, Covid) Nasopharyngeal Swab     Status: None   Collection Time: 02/28/21  4:29 AM   Specimen: Nasopharyngeal Swab; Nasopharyngeal(NP) swabs in vial transport medium  Result Value Ref Range Status   SARS Coronavirus 2 by RT PCR NEGATIVE NEGATIVE Final    Comment: (NOTE) SARS-CoV-2 target nucleic acids are NOT DETECTED.  The SARS-CoV-2 RNA is generally detectable in upper respiratory specimens during the acute phase of infection. The lowest concentration of SARS-CoV-2 viral copies this assay can detect is 138 copies/mL. A negative result does not preclude SARS-Cov-2 infection and should not be used as the sole basis for treatment  or other patient management decisions. A negative result may occur with  improper specimen collection/handling, submission of specimen other than nasopharyngeal swab, presence of viral mutation(s) within the areas targeted by this assay, and inadequate number of viral copies(<138 copies/mL). A negative result must be combined with clinical observations, patient history, and epidemiological information. The expected result is Negative.  Fact Sheet for Patients:  BloggerCourse.com  Fact Sheet for Healthcare Providers:  SeriousBroker.it  This test is no t yet approved or cleared by the [place] FDA and  has been authorized for detection and/or diagnosis of SARS-CoV-2 by FDA under an Emergency Use Authorization (EUA). This EUA will remain  in effect (meaning this test can be used) for the duration  of the COVID-19 declaration under Section 564(b)(1) of the Act, 21 U.S.C.section 360bbb-3(b)(1), unless the authorization is terminated  or revoked sooner.       Influenza A by PCR NEGATIVE NEGATIVE Final   Influenza B by PCR NEGATIVE NEGATIVE Final    Comment: (NOTE) The Xpert Xpress SARS-CoV-2/FLU/RSV plus assay is intended as an aid in the diagnosis of influenza from Nasopharyngeal swab specimens and should not be used as a sole basis for treatment. Nasal washings and aspirates are unacceptable for Xpert Xpress SARS-CoV-2/FLU/RSV testing.  Fact Sheet for Patients: BloggerCourse.com  Fact Sheet for Healthcare Providers: SeriousBroker.it  This test is not yet approved or cleared by the [place] FDA and has been authorized for detection and/or diagnosis of SARS-CoV-2 by FDA under an Emergency Use Authorization (EUA). This EUA will remain in effect (meaning this test can be used) for the duration of the COVID-19 declaration under Section 564(b)(1) of the Act, 21 U.S.C. section 360bbb-3(b)(1), unless the authorization is terminated or revoked.  Performed at [facility], [address], [place], [place] [address]   MRSA Next Gen by PCR, Nasal     Status: None   Collection Time: 02/28/21  1:06 PM   Specimen: Nasal Mucosa; Nasal Swab  Result Value Ref Range Status   MRSA by PCR Next Gen NOT DETECTED NOT DETECTED Final    Comment: (NOTE) The GeneXpert MRSA Assay (FDA approved for NASAL specimens only), is one component of a comprehensive MRSA colonization surveillance program. It is not intended to diagnose MRSA infection nor to guide or monitor treatment for MRSA infections. Test performance is not FDA approved in patients less than 66 years old. Performed at [facility], [address], [place], [place] [address]      Time coordinating discharge: Over 30  minutes  SIGNED:   Marinda Elk, MD  [facility] 03/02/2021, 8:58 AM Pager   If 7PM-7AM, please contact night-coverage [email or web address] Password [email or web address]

## 2021-03-06 ENCOUNTER — Telehealth: Payer: Self-pay | Admitting: Neurology

## 2021-03-06 ENCOUNTER — Encounter: Payer: Self-pay | Admitting: Registered Nurse

## 2021-03-06 NOTE — Telephone Encounter (Signed)
Pt's grandmother just wanted Dr Terrace Arabia to be aware of a fall pt had on 8-8. Pt bumped her head and was taken to [facility].  Pt's Grandmother wants Dr Terrace Arabia to call if she thinks pt should be scheduled for a MRI

## 2021-03-06 NOTE — Telephone Encounter (Signed)
Per hospital records, head injury and hospitalization related to alcohol withdrawal. She underwent head, cervical and maxillofacial CT on 02/27/21. Per her grandmother on [place], she is still sore but headache is starting to improve. She is going to follow up with her PCP next week per discharge instructions, to see if any further testing will be required.

## 2021-03-07 ENCOUNTER — Encounter: Payer: Self-pay | Admitting: Behavioral Health

## 2021-03-07 ENCOUNTER — Ambulatory Visit (INDEPENDENT_AMBULATORY_CARE_PROVIDER_SITE_OTHER): Payer: 59 | Admitting: Behavioral Health

## 2021-03-07 ENCOUNTER — Other Ambulatory Visit: Payer: Self-pay

## 2021-03-07 DIAGNOSIS — F331 Major depressive disorder, recurrent, moderate: Secondary | ICD-10-CM | POA: Diagnosis not present

## 2021-03-07 DIAGNOSIS — F1020 Alcohol dependence, uncomplicated: Secondary | ICD-10-CM | POA: Diagnosis not present

## 2021-03-07 DIAGNOSIS — F411 Generalized anxiety disorder: Secondary | ICD-10-CM
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
## 2021-03-09 ENCOUNTER — Inpatient Hospital Stay: Payer: 59 | Admitting: Registered Nurse

## 2021-03-09 ENCOUNTER — Encounter: Payer: Self-pay | Admitting: Registered Nurse

## 2021-03-13 ENCOUNTER — Ambulatory Visit: Payer: 59 | Admitting: Registered Nurse

## 2021-03-14 ENCOUNTER — Inpatient Hospital Stay: Payer: 59 | Admitting: Registered Nurse

## 2021-03-20 ENCOUNTER — Telehealth: Payer: Self-pay | Admitting: Behavioral Health

## 2021-03-20 NOTE — Telephone Encounter (Signed)
Grandmother called and would like to give you an update on Natasha Pope. Please call her at [phone]

## 2021-03-20 NOTE — Telephone Encounter (Signed)
Pt wanted to let you know [facility].

## 2021-03-21 ENCOUNTER — Telehealth: Payer: Self-pay | Admitting: Behavioral Health

## 2021-03-21 NOTE — Telephone Encounter (Signed)
Kayla with [facility] would like for you to call her back when you can regarding [facility]. Kayla's phone number is [phone].

## 2021-03-21 NOTE — Telephone Encounter (Signed)
Please review

## 2021-03-21 NOTE — Telephone Encounter (Signed)
Completed.  Thank you! 

## 2021-04-04 ENCOUNTER — Encounter: Payer: 59 | Admitting: Registered Nurse

## 2021-04-20 ENCOUNTER — Other Ambulatory Visit: Payer: Self-pay | Admitting: Registered Nurse

## 2021-04-20 DIAGNOSIS — R440 Auditory hallucinations: Secondary | ICD-10-CM

## 2021-04-20 DIAGNOSIS — R441 Visual hallucinations: Secondary | ICD-10-CM

## 2021-05-05 ENCOUNTER — Encounter: Payer: 59 | Admitting: Registered Nurse

## 2021-05-05 ENCOUNTER — Encounter: Payer: 59 | Admitting: Family Medicine

## 2021-05-08 ENCOUNTER — Encounter: Payer: Self-pay | Admitting: Registered Nurse

## 2021-05-09 ENCOUNTER — Ambulatory Visit: Payer: 59 | Admitting: Neurology

## 2021-05-11 ENCOUNTER — Telehealth: Payer: 59 | Admitting: Family Medicine

## 2021-05-11 ENCOUNTER — Other Ambulatory Visit: Payer: Self-pay

## 2021-05-14 ENCOUNTER — Other Ambulatory Visit: Payer: Self-pay | Admitting: Registered Nurse

## 2021-05-14 DIAGNOSIS — F330 Major depressive disorder, recurrent, mild: Secondary | ICD-10-CM

## 2021-05-14 NOTE — Telephone Encounter (Signed)
This concern has been previously addressed by myself and/or another provider.  If they patient has ongoing concerns, they can contact me at their convenience.  Thank you,  Rich Connelly Spruell, NP 

## 2021-05-25 ENCOUNTER — Ambulatory Visit (INDEPENDENT_AMBULATORY_CARE_PROVIDER_SITE_OTHER): Payer: 59 | Admitting: Registered Nurse

## 2021-05-25 ENCOUNTER — Encounter: Payer: Self-pay | Admitting: Registered Nurse

## 2021-05-25 ENCOUNTER — Ambulatory Visit (INDEPENDENT_AMBULATORY_CARE_PROVIDER_SITE_OTHER): Payer: 59 | Admitting: Behavioral Health

## 2021-05-25 ENCOUNTER — Encounter: Payer: Self-pay | Admitting: Behavioral Health

## 2021-05-25 ENCOUNTER — Other Ambulatory Visit: Payer: Self-pay

## 2021-05-25 VITALS — BP 96/63 | HR 85 | Temp 98.0°F | Resp 17 | Ht 66.0 in | Wt 176.8 lb

## 2021-05-25 DIAGNOSIS — F1020 Alcohol dependence, uncomplicated: Secondary | ICD-10-CM | POA: Diagnosis not present

## 2021-05-25 DIAGNOSIS — F5105 Insomnia due to other mental disorder: Secondary | ICD-10-CM

## 2021-05-25 DIAGNOSIS — K529 Noninfective gastroenteritis and colitis, unspecified: Secondary | ICD-10-CM

## 2021-05-25 DIAGNOSIS — H547 Unspecified visual loss: Secondary | ICD-10-CM | POA: Diagnosis not present

## 2021-05-25 DIAGNOSIS — F1010 Alcohol abuse, uncomplicated: Secondary | ICD-10-CM

## 2021-05-25 DIAGNOSIS — F99 Mental disorder, not otherwise specified: Secondary | ICD-10-CM

## 2021-05-25 DIAGNOSIS — F330 Major depressive disorder, recurrent, mild: Secondary | ICD-10-CM

## 2021-05-25 DIAGNOSIS — F411 Generalized anxiety disorder: Secondary | ICD-10-CM | POA: Diagnosis not present

## 2021-05-25 DIAGNOSIS — F331 Major depressive disorder, recurrent, moderate: Secondary | ICD-10-CM

## 2021-05-25 DIAGNOSIS — G43709 Chronic migraine without aura, not intractable, without status migrainosus: Secondary | ICD-10-CM

## 2021-05-25 MED ORDER — QUETIAPINE FUMARATE 50 MG PO TABS: 50.0000 mg | ORAL_TABLET | Freq: Every day | ORAL | 1 refills | Status: DC

## 2021-05-25 MED ORDER — DULOXETINE HCL 60 MG PO CPEP
60.0000 mg | ORAL_CAPSULE | Freq: Every day | ORAL | 3 refills | Status: DC
Start: 2021-05-25 — End: 2021-06-22

## 2021-05-25 MED ORDER — BUPROPION HCL ER (XL) 300 MG PO TB24: 300.0000 mg | ORAL_TABLET | Freq: Every day | ORAL | 3 refills | Status: DC

## 2021-05-25 MED ORDER — ONDANSETRON 4 MG PO TBDP: 4.0000 mg | ORAL_TABLET | Freq: Three times a day (TID) | ORAL | 0 refills | Status: DC | PRN

## 2021-05-25 MED ORDER — NALTREXONE HCL 50 MG PO TABS: 50.0000 mg | ORAL_TABLET | Freq: Every day | ORAL | 1 refills | Status: DC

## 2021-05-25 MED ORDER — BUSPIRONE HCL 10 MG PO TABS: 10.0000 mg | ORAL_TABLET | Freq: Three times a day (TID) | ORAL | 1 refills | Status: DC

## 2021-05-25 MED ORDER — RIZATRIPTAN BENZOATE 10 MG PO TABS
10.0000 mg | ORAL_TABLET | ORAL | 0 refills | Status: DC | PRN
Start: 2021-05-25 — End: 2021-06-01

## 2021-05-25 MED ORDER — PROPRANOLOL HCL 20 MG PO TABS: 20.0000 mg | ORAL_TABLET | Freq: Three times a day (TID) | ORAL | 1 refills | Status: DC

## 2021-05-25 NOTE — Progress Notes (Signed)
Established Patient Office Visit  Subjective:  Patient ID: Natasha Pope, female    DOB: 1995-09-09  Age: 25 y.o. MRN: [id number]  CC:  Chief Complaint  Patient presents with   Follow-up    Patient states she is here for a follow up on rehab. And also want to talk about left eye pain.    HPI [place] presents for follow up   Med management Propranolol 3m tid prn, buspar 148mpo tid, trazodone 10060mo qhs prn, naltrexone 68m33m qd, quetiapine 68mg20mqhs prn, aripiprazole 5mg p70md, cymbalta 60mg p29m  Managed with psychiatry She is doing very well with this - she is working on tapering down abilify. Using seroquel and trazodone as PRNs.   Migraines Topamax 68mg po61m. Not effective.  Sumatriptan 68mg po 6ms migraines worse Her mother has migraines - similar reaction to sumatriptan. Interested in trying other options. Interested in seeing neurologist   Eye pain Ongoing since fall in August CT maxillofacial at that time showed no fracture to orbit or injury to orbit EOM pain upper left.  Decreased visual acuity in L eye L: 20/50 R: 20/20 Both: 20/20  Past Medical History:  Diagnosis Date   Anxiety    Asthma    Depression    Pyelonephritis    Seizure-like activity (HCC)    Sepsis (HCC)     [place] Surgical History:  Procedure Laterality Date   dental procedure      Family History  Problem Relation Age of Onset   Depression Mother    Misc[place]riages / [place]  Thyroid disease Mother    Hypertension Father    Alcohol abuse Father    Learning disabilities Sister    Hyperlipidemia Maternal Grandmother    Alcohol abuse Maternal Grandfather    Alcohol abuse Paternal Grandmother    Hypertension Paternal Grandfather     Social History   Socioeconomic History   Marital status: Single    Spouse name: Not on file   Number of children: 0   Years of education: some college   Highest education level: High school graduate   Occupational History   Occupation: unemployed    Comment: Works for [employer] [place] status: Former    Packs/day: 0.25    Types: Cigarettes    Quit date: 05/10/2018    Years since quitting: 3.0   Smokeless tobacco: Never  Vaping Use   Vaping Use: Never used  Substance and Sexual Activity   Alcohol use: Yes    Comment: 02/17/2021 7-9 2-3 oz pours of liquor per day   Drug use: Not Currently    Types: Marijuana    Comment: 10/03/20: No longer using. Last use was 4 months ago.   Sexual activity: Not Currently    Birth control/protection: Pill  Other Topics Concern   Not on file  Social History Narrative   Living alone in [facility] [place]. [place].    Left-handed.   No daily use of caffeine.   Social Determinants of Health   Financial Resource Strain: Not on file  Food Insecurity: Not on file  Transportation Needs: Not on file  Physical Activity: Not on file  Stress: Not on file  Social Connections: Not on file  Intimate Partner Violence: Not on file    Outpatient Medications Prior to Visit  Medication Sig Dispense Refill   ARIPiprazole (  ABILIFY) 5 MG tablet TAKE ONE TABLET BY MOUTH ONE TIME DAILY 90 tablet 0   buPROPion (WELLBUTRIN SR) 150 MG 12 hr tablet TAKE ONE TABLET BY MOUTH TWICE DAILY 180 tablet 0   buPROPion (WELLBUTRIN XL) 300 MG 24 hr tablet Take 1 tablet (300 mg total) by mouth daily. 30 tablet 3   busPIRone (BUSPAR) 10 MG tablet Take 1 tablet (10 mg total) by mouth 3 (three) times daily. 90 tablet 1   DULoxetine (CYMBALTA) 30 MG capsule Take 30 mg by mouth every morning.     DULoxetine (CYMBALTA) 60 MG capsule Take 1 capsule (60 mg total) by mouth daily. 30 capsule 3   naltrexone (DEPADE) 50 MG tablet Take 1 tablet (50 mg total) by mouth daily. 90 tablet 1   norethindrone-ethinyl estradiol-FE (HAILEY FE 1/20) 1-20 MG-MCG tablet Take 1 tablet by mouth daily. 84 tablet 4    propranolol (INDERAL) 20 MG tablet Take 1 tablet (20 mg total) by mouth 3 (three) times daily. 90 tablet 1   QUEtiapine (SEROQUEL) 50 MG tablet Take 1 tablet (50 mg total) by mouth at bedtime. 30 tablet 1   triamcinolone cream (KENALOG) 0.1 % Apply to affected area 1-2 times daily 30 g 0   zonisamide (ZONEGRAN) 100 MG capsule Take 1 capsule (100 mg total) by mouth 2 (two) times daily. 60 capsule 11   ondansetron (ZOFRAN-ODT) 4 MG disintegrating tablet Take 1 tablet (4 mg total) by mouth every 8 (eight) hours as needed for nausea or vomiting. 30 tablet 0   cetirizine (ZYRTEC) 10 MG tablet Take 1 tablet (10 mg total) by mouth daily. (Patient not taking: No sig reported) 365 tablet 0   potassium chloride (KLOR-CON) 10 MEQ tablet Take 1 tablet (10 mEq total) by mouth daily. 30 tablet 0   No facility-administered medications prior to visit.    Allergies  Allergen Reactions   Lamotrigine Hives    ROS Review of Systems  Constitutional: Negative.   HENT: Negative.    Eyes: Negative.   Respiratory: Negative.    Cardiovascular: Negative.   Gastrointestinal: Negative.   Genitourinary: Negative.   Musculoskeletal: Negative.   Skin: Negative.   Neurological: Negative.   Psychiatric/Behavioral: Negative.    All other systems reviewed and are negative.    Objective:    Physical Exam Vitals and nursing note reviewed.  Constitutional:      General: She is not in acute distress.    Appearance: Normal appearance. She is normal weight. She is not ill-appearing, toxic-appearing or diaphoretic.  Eyes:     General: Lids are normal. Lids are everted, no foreign bodies appreciated. Vision grossly intact. Gaze aligned appropriately. No allergic shiner, visual field deficit or scleral icterus.       Right eye: No foreign body, discharge or hordeolum.        Left eye: No foreign body, discharge or hordeolum.     Extraocular Movements: Extraocular movements intact.     Right eye: Normal extraocular  motion and no nystagmus.     Left eye: Normal extraocular motion and no nystagmus.     Conjunctiva/sclera: Conjunctivae normal.     Right eye: Right conjunctiva is not injected. No chemosis, exudate or hemorrhage.    Left eye: Left conjunctiva is not injected. No chemosis, exudate or hemorrhage.    Pupils: Pupils are equal, round, and reactive to light. Pupils are equal.     Right eye: Pupil is round, reactive and not sluggish.     Left  eye: Pupil is round, reactive and not sluggish.     Funduscopic exam:    Right eye: No hemorrhage, exudate or AV nicking. Red reflex present.        Left eye: No hemorrhage, exudate or AV nicking. Red reflex present. Cardiovascular:     Rate and Rhythm: Normal rate and regular rhythm.     Heart sounds: Normal heart sounds. No murmur heard.   No friction rub. No gallop.  Pulmonary:     Effort: Pulmonary effort is normal. No respiratory distress.     Breath sounds: Normal breath sounds. No stridor. No wheezing, rhonchi or rales.  Chest:     Chest wall: No tenderness.  Skin:    General: Skin is warm and dry.  Neurological:     General: No focal deficit present.     Mental Status: She is alert and oriented to person, place, and time. Mental status is at baseline.  Psychiatric:        Mood and Affect: Mood normal.        Behavior: Behavior normal.        Thought Content: Thought content normal.        Judgment: Judgment normal.    BP 96/63   Pulse 85   Temp 98 F (36.7 C) (Temporal)   Resp 17   Ht _0  (1.676 m)   Wt 176 lb 12.8 oz (80.2 kg)   SpO2 98%   BMI 28.54 kg/m  Wt Readings from Last 3 Encounters:  05/25/21 176 lb 12.8 oz (80.2 kg)  02/27/21 171 lb (77.6 kg)  01/24/21 185 lb 3.2 oz (84 kg)     Health Maintenance Due  Topic Date Due   COVID-19 Vaccine (1) Never done   INFLUENZA VACCINE  Never done   PAP-Cervical Cytology Screening  03/03/2021   PAP SMEAR-Modifier  03/03/2021    There are no preventive care reminders to  display for this patient.  Lab Results  Component Value Date   TSH 1.34 01/24/2021   Lab Results  Component Value Date   WBC 4.7 03/01/2021   HGB 12.3 03/01/2021   HCT 38.0 03/01/2021   MCV 106.7 (H) 03/01/2021   PLT 107 (L) 03/01/2021   Lab Results  Component Value Date   NA 138 03/02/2021   K 3.6 03/02/2021   CO2 20 (L) 03/02/2021   GLUCOSE 116 (H) 03/02/2021   BUN 6 03/02/2021   CREATININE 0.60 03/02/2021   BILITOT 1.3 (H) 03/01/2021   ALKPHOS 76 03/01/2021   AST 181 (H) 03/01/2021   ALT 68 (H) 03/01/2021   PROT 6.7 03/01/2021   ALBUMIN 3.9 03/01/2021   CALCIUM 9.9 03/02/2021   ANIONGAP 13 03/02/2021   EGFR 102 10/03/2020   GFR 104.88 01/24/2021   Lab Results  Component Value Date   CHOL 254 (H) 05/04/2020   Lab Results  Component Value Date   HDL 122 05/04/2020   Lab Results  Component Value Date   LDLCALC 112 (H) 05/04/2020   Lab Results  Component Value Date   TRIG 95 05/04/2020   Lab Results  Component Value Date   CHOLHDL 2.1 05/04/2020   Lab Results  Component Value Date   HGBA1C 5.1 12/21/2020      Assessment & Plan:   Problem List Items Addressed This Visit   None Visit Diagnoses     Chronic migraine without aura without status migrainosus, not intractable    -  Primary   Relevant Medications  rizatriptan (MAXALT) 10 MG tablet   Decreased visual acuity       Relevant Orders   Ambulatory referral to Ophthalmology       Meds ordered this encounter  Medications   rizatriptan (MAXALT) 10 MG tablet    Sig: Take 1 tablet (10 mg total) by mouth as needed for migraine. May repeat in 2 hours if needed    Dispense:  10 tablet    Refill:  0    Order Specific Question:   Supervising Provider    Answer:   Carlota Raspberry, JEFFREY R [[id number]]    Follow-up: Return in about 3 months (around 08/25/2021) for CPE and labs.   PLAN Exam unremarkable - eyes appear wnl. Some tenderness to upper left orbit, pain with EOM to upper left. Will refer to  ophthalmology.  Continue current medication regimen Try rizatriptan for migraines. If no effect, can refer to neuro. Congratulated patient on sobriety. She is doing remarkably well and I have offered any support that I can in her continuing these positive steps. Patient encouraged to call clinic with any questions, comments, or concerns.   Maximiano Coss, NP

## 2021-05-25 NOTE — Progress Notes (Signed)
[facility]  Patient ID: Natasha Pope,  MRN: [id number]  PCP: Janeece Agee, NP  Date of Evaluation: 05/26/2021 Time spent:60 minutes  Chief Complaint:  Chief Complaint   Alcohol Problem; Anxiety; Depression; Follow-up; Medication Refill; Addiction Problem; Medication Problem     HISTORY/CURRENT STATUS: HPI 25 year old female presents to this office for follow up and medication management post [facility]. She says that she has done very well and is currently in IOP three days per week as well staying in [facility]. She is committed at this time to continuation of following the program guidelines. Says that she has been ETOH free for nearly three months. She says currently that her cravings have been mild. She is planning on follow up with PCP or specialist for continued pain over left eye due to fall while intoxicated.  She would like to slowly reduce some of her psychotropic medication list. She says her anxiety level today is 3/10 but her depression is 6/10. She does want to makes changes to medications to treat her depression. Says she is sleeping about 7 hours per night. 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 Says she is sleeping 7 hours per night. She denies any mania, no psychosis. No SI/HI. She has verbally contracted for safety with this Clinical research associate and understands resources available in emergency.   Past psychiatric medication trials; Pt cannot advise on complete list. Naltrexone Duloxetine Buproprion Klonopin     Individual Medical History/ Review of Systems: Changes? :No   Allergies: Lamotrigine  Current Medications:  Current Outpatient Medications:    ARIPiprazole (ABILIFY) 5 MG tablet, TAKE ONE TABLET BY MOUTH ONE TIME DAILY, Disp: 90 tablet, Rfl: 0   buPROPion (WELLBUTRIN XL) 300 MG 24 hr  tablet, Take 1 tablet (300 mg total) by mouth daily., Disp: 30 tablet, Rfl: 3   DULoxetine (CYMBALTA) 60 MG capsule, Take 1 capsule (60 mg total) by mouth daily., Disp: 30 capsule, Rfl: 3   norethindrone-ethinyl estradiol-FE (HAILEY FE 1/20) 1-20 MG-MCG tablet, Take 1 tablet by mouth daily., Disp: 84 tablet, Rfl: 4   QUEtiapine (SEROQUEL) 50 MG tablet, Take 1 tablet (50 mg total) by mouth at bedtime., Disp: 30 tablet, Rfl: 1   triamcinolone cream (KENALOG) 0.1 %, Apply to affected area 1-2 times daily, Disp: 30 g, Rfl: 0   buPROPion (WELLBUTRIN SR) 150 MG 12 hr tablet, TAKE ONE TABLET BY MOUTH TWICE DAILY (Patient not taking: Reported on 05/25/2021), Disp: 180 tablet, Rfl: 0   busPIRone (BUSPAR) 10 MG tablet, Take 1 tablet (10 mg total) by mouth 3 (three) times daily., Disp: 90 tablet, Rfl: 1   cetirizine (ZYRTEC) 10 MG tablet, Take 1 tablet (10 mg total) by mouth daily. (Patient not taking: No sig reported), Disp: 365 tablet, Rfl: 0   DULoxetine (CYMBALTA) 30 MG capsule, Take 30 mg by mouth every morning. (Patient not taking: Reported on 05/25/2021), Disp: , Rfl:    naltrexone (DEPADE) 50 MG tablet, Take 1 tablet (50 mg total) by mouth daily., Disp: 90 tablet, Rfl: 1   potassium chloride (KLOR-CON) 10 MEQ tablet, Take 1 tablet (10 mEq total) by mouth daily., Disp: 30 tablet, Rfl: 0   propranolol (INDERAL) 20 MG tablet, Take 1 tablet (20 mg total) by mouth 3 (three) times daily., Disp: 90 tablet, Rfl: 1   rizatriptan (  MAXALT) 10 MG tablet, Take 1 tablet (10 mg total) by mouth as needed for migraine. May repeat in 2 hours if needed, Disp: 10 tablet, Rfl: 0   zonisamide (ZONEGRAN) 100 MG capsule, Take 1 capsule (100 mg total) by mouth 2 (two) times daily., Disp: 60 capsule, Rfl: 11 Medication Side Effects: none  Family Medical/ Social History: Changes? No  MENTAL HEALTH EXAM:  There were no vitals taken for this visit.There is no height or weight on file to calculate BMI.  General Appearance:  Casual, Neat, and Well Groomed  Eye Contact:  Good  Speech:  Clear and Coherent  Volume:  Normal  Mood:  Depressed  Affect:  Appropriate and Non-Congruent  Thought Process:  Coherent  Orientation:  Full (Time, Place, and Person)  Thought Content: Logical   Suicidal Thoughts:  No  Homicidal Thoughts:  No  Memory:  WNL  Judgement:  Fair  Insight:  Fair  Psychomotor Activity:  Normal  Concentration:  Concentration: Good  Recall:  NA  Fund of Knowledge: Good  Language: Good  Assets:  Desire for Improvement  ADL's:  Intact  Cognition: WNL  Prognosis:  Fair    DIAGNOSES:    ICD-10-CM   1. Alcohol use disorder, severe, dependence (HCC)  F10.20 buPROPion (WELLBUTRIN XL) 300 MG 24 hr tablet    2. Generalized anxiety disorder  F41.1 buPROPion (WELLBUTRIN XL) 300 MG 24 hr tablet    busPIRone (BUSPAR) 10 MG tablet    propranolol (INDERAL) 20 MG tablet    DULoxetine (CYMBALTA) 60 MG capsule    3. Moderate episode of recurrent major depressive disorder (HCC)  F33.1 buPROPion (WELLBUTRIN XL) 300 MG 24 hr tablet    DULoxetine (CYMBALTA) 60 MG capsule    4. Mild episode of recurrent major depressive disorder (HCC)  F33.0 DULoxetine (CYMBALTA) 60 MG capsule    5. Alcohol abuse  F10.10 naltrexone (DEPADE) 50 MG tablet    6. Gastroenteritis  K52.9 DISCONTINUED: ondansetron (ZOFRAN-ODT) 4 MG disintegrating tablet    7. Insomnia due to other mental disorder  F51.05 QUEtiapine (SEROQUEL) 50 MG tablet   F99       Receiving Psychotherapy: No    RECOMMENDATIONS:   Greater than 50% of face to face time with patient was spent on counseling and coordination of care. We discussed her successful completion of the program at [facility] and current IOP program. She is currently in half-way at [place] house with 5 other roommates. We reconciled the medications she left the program with and made sure she had enough refills. We discussed plans on when to adjust and reduce her medication regimen  over the next few visits. She is proud of completing the program is showing determination with recovery. She understands that recovery is a lifelong process. We agree to: Reduce Aripiprazole to 2.5 mg daily from 5 mg. Increase Wellbutrin to 300 mg XR daily Continue Buspar 10 mg three times daily Continue Duloxetine 60 mg daily Continue Naltrexone 50 mg daily Continue Propranolol 20 mg three times daily Continue Seroquel 50 mg at bedtime Will report side effects or worsening symptoms or relapse To follow up regularly with is office and PCP Will follow up in 4 weeks to reassess Provided emergency contact and support information She is currently on [facility] Reviewed PDMP    Joan Flores, NP

## 2021-05-25 NOTE — Patient Instructions (Addendum)
Ms. Catlin -   I cannot overstate how THRILLED I am to see you doing so well!   Let's get you in to see an ophthalmology team - I'll have them give you a call. It will be here in [place].   I think they'll be able to address the pain in the eye.  You can try taking the etodolac I've sent to the pharmacy. Ok to use with all of your meds EXCEPT the Azo and ibuprofen. It's a prescription strength NSAID.  Let's plan to touch base in about 3 months.   Thank you! Please let me know if I can do anything for you!  Rich     If you have lab work done today you will be contacted with your lab results within the next 2 weeks.  If you have not heard from [place] then please contact us. The fastest way to get your results is to register for My Chart.   IF you received an x-ray today, you will receive an invoice from [facility]. Please contact [facility] at [phone] with questions or concerns regarding your invoice.   IF you received labwork today, you will receive an invoice from [place]. Please contact [facility] at [phone] with questions or concerns regarding your invoice.   Our billing staff will not be able to assist you with questions regarding bills from these companies.  You will be contacted with the lab results as soon as they are available. The fastest way to get your results is to activate your My Chart account. Instructions are located on the last page of this paperwork. If you have not heard from [place] regarding the results in 2 weeks, please contact this office.

## 2021-05-26 ENCOUNTER — Encounter: Payer: Self-pay | Admitting: Behavioral Health

## 2021-06-01 ENCOUNTER — Other Ambulatory Visit: Payer: Self-pay | Admitting: Registered Nurse

## 2021-06-01 DIAGNOSIS — G43709 Chronic migraine without aura, not intractable, without status migrainosus: Secondary | ICD-10-CM

## 2021-06-14 ENCOUNTER — Other Ambulatory Visit: Payer: Self-pay | Admitting: Registered Nurse

## 2021-06-14 DIAGNOSIS — G43709 Chronic migraine without aura, not intractable, without status migrainosus: Secondary | ICD-10-CM

## 2021-06-14 NOTE — Telephone Encounter (Signed)
Patient is requesting a refill of the following medications: Requested Prescriptions   Pending Prescriptions Disp Refills   rizatriptan (MAXALT) 10 MG tablet [Pharmacy Med Name: Rizatriptan Benzoate Oral Tablet 10 MG] 10 tablet 0    Sig: TAKE ONE TABLET BY MOUTH AS NEEDED *may repeat in 2 hours if needed*    Date of patient request: 06/14/2021 Last office visit: 05/25/2021 Date of last refill: 06/01/2021 Last refill amount: 10 tablets  Follow up time period per chart: 08/28/2021

## 2021-06-22 ENCOUNTER — Encounter: Payer: Self-pay | Admitting: Behavioral Health

## 2021-06-22 ENCOUNTER — Other Ambulatory Visit: Payer: Self-pay

## 2021-06-22 ENCOUNTER — Ambulatory Visit (INDEPENDENT_AMBULATORY_CARE_PROVIDER_SITE_OTHER): Payer: 59 | Admitting: Behavioral Health

## 2021-06-22 DIAGNOSIS — F5105 Insomnia due to other mental disorder: Secondary | ICD-10-CM

## 2021-06-22 DIAGNOSIS — F1010 Alcohol abuse, uncomplicated: Secondary | ICD-10-CM

## 2021-06-22 DIAGNOSIS — F1020 Alcohol dependence, uncomplicated: Secondary | ICD-10-CM | POA: Diagnosis not present

## 2021-06-22 DIAGNOSIS — F4001 Agoraphobia with panic disorder: Secondary | ICD-10-CM

## 2021-06-22 DIAGNOSIS — F331 Major depressive disorder, recurrent, moderate: Secondary | ICD-10-CM

## 2021-06-22 DIAGNOSIS — R441 Visual hallucinations: Secondary | ICD-10-CM

## 2021-06-22 DIAGNOSIS — F99 Mental disorder, not otherwise specified: Secondary | ICD-10-CM

## 2021-06-22 DIAGNOSIS — F411 Generalized anxiety disorder: Secondary | ICD-10-CM

## 2021-06-22 MED ORDER — NALTREXONE HCL 50 MG PO TABS: 50.0000 mg | ORAL_TABLET | Freq: Every day | ORAL | 1 refills | Status: DC

## 2021-06-22 MED ORDER — BUSPIRONE HCL 15 MG PO TABS: 15.0000 mg | ORAL_TABLET | Freq: Three times a day (TID) | ORAL | 2 refills | Status: DC

## 2021-06-22 MED ORDER — QUETIAPINE FUMARATE 50 MG PO TABS: 50.0000 mg | ORAL_TABLET | Freq: Every day | ORAL | 2 refills | Status: DC

## 2021-06-22 MED ORDER — ARIPIPRAZOLE 10 MG PO TABS: 10.0000 mg | ORAL_TABLET | Freq: Every day | ORAL | 2 refills | Status: DC

## 2021-06-22 MED ORDER — PROPRANOLOL HCL 20 MG PO TABS: 20.0000 mg | ORAL_TABLET | Freq: Three times a day (TID) | ORAL | 2 refills | Status: DC

## 2021-06-22 MED ORDER — DULOXETINE HCL 30 MG PO CPEP: 30.0000 mg | ORAL_CAPSULE | Freq: Two times a day (BID) | ORAL | 2 refills | Status: DC
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
## 2021-07-13 ENCOUNTER — Ambulatory Visit (INDEPENDENT_AMBULATORY_CARE_PROVIDER_SITE_OTHER): Payer: 59 | Admitting: Behavioral Health

## 2021-07-13 ENCOUNTER — Other Ambulatory Visit: Payer: Self-pay

## 2021-07-13 ENCOUNTER — Encounter: Payer: Self-pay | Admitting: Behavioral Health

## 2021-07-13 DIAGNOSIS — F4001 Agoraphobia with panic disorder: Secondary | ICD-10-CM | POA: Diagnosis not present

## 2021-07-13 DIAGNOSIS — F331 Major depressive disorder, recurrent, moderate: Secondary | ICD-10-CM | POA: Diagnosis not present

## 2021-07-13 DIAGNOSIS — F330 Major depressive disorder, recurrent, mild: Secondary | ICD-10-CM

## 2021-07-13 DIAGNOSIS — F1020 Alcohol dependence, uncomplicated: Secondary | ICD-10-CM | POA: Diagnosis not present

## 2021-07-13 DIAGNOSIS — F411 Generalized anxiety disorder: Secondary | ICD-10-CM | POA: Diagnosis not present

## 2021-07-13 DIAGNOSIS — F1010 Alcohol abuse, uncomplicated: Secondary | ICD-10-CM

## 2021-07-13 DIAGNOSIS — F99 Mental disorder, not otherwise specified: Secondary | ICD-10-CM

## 2021-07-13 DIAGNOSIS — G43709 Chronic migraine without aura, not intractable, without status migrainosus: Secondary | ICD-10-CM

## 2021-07-13 DIAGNOSIS — F5105 Insomnia due to other mental disorder: Secondary | ICD-10-CM

## 2021-07-13 MED ORDER — RIZATRIPTAN BENZOATE 10 MG PO TABS: ORAL_TABLET | ORAL | 1 refills | Status: DC

## 2021-07-13 MED ORDER — NALTREXONE HCL 50 MG PO TABS: 50.0000 mg | ORAL_TABLET | Freq: Every day | ORAL | 2 refills | Status: DC

## 2021-07-13 MED ORDER — TRAZODONE HCL 100 MG PO TABS: 100.0000 mg | ORAL_TABLET | Freq: Every day | ORAL | 2 refills | Status: DC

## 2021-07-13 NOTE — Progress Notes (Signed)
[facility]  Patient ID: Natasha Pope,  MRN: [id number]  PCP: Janeece Agee, NP  Date of Evaluation: 07/13/2021 Time spent:30 minutes  Chief Complaint:  Chief Complaint   Anxiety; Depression; Follow-up; Medication Refill     HISTORY/CURRENT STATUS: HPI  25 year old female presents to this office for follow up and medication management post completion of the [facility]. She continues to stay in [facility] while recovering from ETOH. She has had no relapses since last visit. She says currently that her cravings have been mild.  She says her anxiety level today is 2/10 but her depression is 3/10. She does want to makes changes to medications to treat her depression. Says she is sleeping about 7 hours per night. 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 She really likes it. 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 Says she is sleeping 7 hours per night. She denies any mania, no psychosis. No SI/HI. She has verbally contracted for safety with this Clinical research associate and understands resources available in emergency.    Past psychiatric medication trials; Pt cannot advise on complete list. Naltrexone Duloxetine Buproprion Klonopin Wellbutrin Trazodone Abilify   Individual Medical History/ Review of Systems: Changes? :No   Allergies: Lamotrigine  Current Medications:  Current Outpatient Medications:    traZODone (DESYREL) 100 MG tablet, Take 1 tablet (100 mg total) by mouth at bedtime., Disp: 30 tablet, Rfl: 2   ARIPiprazole (ABILIFY) 10 MG tablet, Take 1 tablet (10 mg total) by mouth daily., Disp: 30 tablet, Rfl: 2   busPIRone (BUSPAR) 15 MG tablet, Take 1 tablet (15 mg total) by mouth 3 (three) times daily., Disp: 90 tablet, Rfl: 2   cetirizine (ZYRTEC) 10 MG tablet, Take 1 tablet (10 mg total) by mouth daily. (Patient not taking: No sig reported), Disp: 365 tablet, Rfl:  0   DULoxetine (CYMBALTA) 30 MG capsule, Take 1 capsule (30 mg total) by mouth 2 (two) times daily., Disp: 60 capsule, Rfl: 2   naltrexone (DEPADE) 50 MG tablet, Take 1 tablet (50 mg total) by mouth daily., Disp: 90 tablet, Rfl: 2   norethindrone-ethinyl estradiol-FE (HAILEY FE 1/20) 1-20 MG-MCG tablet, Take 1 tablet by mouth daily., Disp: 84 tablet, Rfl: 4   potassium chloride (KLOR-CON) 10 MEQ tablet, Take 1 tablet (10 mEq total) by mouth daily., Disp: 30 tablet, Rfl: 0   propranolol (INDERAL) 20 MG tablet, Take 1 tablet (20 mg total) by mouth 3 (three) times daily., Disp: 90 tablet, Rfl: 2   QUEtiapine (SEROQUEL) 50 MG tablet, Take 1 tablet (50 mg total) by mouth at bedtime., Disp: 30 tablet, Rfl: 2   rizatriptan (MAXALT) 10 MG tablet, TAKE ONE TABLET BY MOUTH AS NEEDED *may repeat in 2 hours if needed*, Disp: 10 tablet, Rfl: 1   triamcinolone cream (KENALOG) 0.1 %, Apply to affected area 1-2 times daily, Disp: 30 g, Rfl: 0   zonisamide (ZONEGRAN) 100 MG capsule, Take 1 capsule (100 mg total) by mouth 2 (two) times daily., Disp: 60 capsule, Rfl: 11 Medication Side Effects: none  Family Medical/ Social History: Changes? No  MENTAL HEALTH EXAM:  There were no vitals taken for this visit.There is no height or weight on file to calculate BMI.  General Appearance: Casual and Neat  Eye Contact:  Good  Speech:  Clear and Coherent  Volume:  Normal  Mood:  NA  Affect:  Appropriate  Thought Process:  Coherent  Orientation:  Full (Time, Place, and Person)  Thought Content: Logical   Suicidal Thoughts:  No  Homicidal Thoughts:  No  Memory:  WNL  Judgement:  Good  Insight:  Good  Psychomotor Activity:  Normal  Concentration:  Concentration: Good  Recall:  Good  Fund of Knowledge: Good  Language: Good  Assets:  Desire for Improvement  ADL's:  Intact  Cognition: WNL  Prognosis:  Good    DIAGNOSES:    ICD-10-CM   1. Alcohol use disorder, severe, dependence (HCC)  F10.20     2.  Generalized anxiety disorder  F41.1     3. Moderate episode of recurrent major depressive disorder (HCC)  F33.1     4. Panic disorder with agoraphobia  F40.01     5. Alcohol abuse  F10.10 naltrexone (DEPADE) 50 MG tablet    6. Insomnia due to other mental disorder  F51.05 traZODone (DESYREL) 100 MG tablet   F99     7. Mild episode of recurrent major depressive disorder (HCC)  F33.0     8. GAD (generalized anxiety disorder)  F41.1     9. Chronic migraine without aura without status migrainosus, not intractable  G43.709 rizatriptan (MAXALT) 10 MG tablet      Receiving Psychotherapy: yes   RECOMMENDATIONS:   Greater than 50% of 30  min face to face time with patient was spent on counseling and coordination of care.We discussed her current stability with ETOH use along with anxiety and depression. She is currently still in half-way at [place] house with 5 other roommates. she no has room alone. She was very candid about a relapse on Nov. 6th where she had period of crying and drank 3 shots of tequila. She now has not had any relapse since last visit and she says that her visual hallucinations are gone and her depression levels are manageable. She really likes how [employer] is making her feel. She is much happier. She says she is back on track and committed to follow up with sponsor regularly and attending AA meetings. We reconciled the medications she left the program with and made sure she had enough refills.She understands that recovery is a lifelong process.     We agree to:   Continue Abilify 10 mg daily To start Auvality 45 mg/105 mg one tablet for 3 days, then take two tablets 90 mg/ 210 mg total daily, divided into two doses 8 hours apart. Increased Buspar  to 15 mg three times daily as needed  Continue Duloxetine 30 mg tablet twice daily (total 60 mg) morning and evening. Continue Naltrexone 50 mg daily Continue Propranolol 20 mg three times daily as needed Continue Seroquel 50  mg at bedtime Will report side effects or worsening symptoms or relapse To follow up regularly with is office and PCP as required Will follow up in 8 weeks to reassess Provided emergency contact and support information She is currently on [facility] Reviewed PDMP      Joan Flores, NP

## 2021-07-28 ENCOUNTER — Other Ambulatory Visit: Payer: Self-pay

## 2021-07-28 MED ORDER — AUVELITY 45-105 MG PO TBCR: 1.0000 | EXTENDED_RELEASE_TABLET | Freq: Two times a day (BID) | ORAL | 2 refills | Status: DC

## 2021-08-03 ENCOUNTER — Telehealth: Payer: Self-pay

## 2021-08-08 ENCOUNTER — Telehealth: Payer: Self-pay | Admitting: Behavioral Health

## 2021-08-08 NOTE — Telephone Encounter (Signed)
Are you able to speak to her?

## 2021-08-08 NOTE — Telephone Encounter (Signed)
Please contact her and have her call back and ask for Traci. She is the one she needs to speak with for this.

## 2021-08-08 NOTE — Telephone Encounter (Signed)
Pt has not received her Auvelity from [facility]. I contacted them and they did not have her pharmacy benefits on file, I reached out to [employer] to get her information. [facility] ran her information and with the denial her cost would be $1678.00. I asked about the manufacturer co-pay card and they report they will send it to billing to run and contact me with the price. Pt will pick up a bottle of samples tomorrow till cost is figured out.   [employer] insurance: bin [phone], pcn CWHS, rxgrp CWE, ID# [id number]  I did already receive a DENIAL from her insurance that she has to try the formularies listed.   This drug is not on our list of covered drugs, also known as a formulary. Our Coverage Determinations - Exceptions policy is used to decide if a not-covered drug can be approved. The conditions in this policy have not been met. From the records that we have received, these reasons caused the denial: 1) All covered drugs used for your health issue have not been tried and failed. Other drugs that can be used are ?bupropion SR(tried), AND bupropion XL(tried), AND one serotonin-norepinephrine reuptake inhibitor (SNRI) (e.g. desvenlafaxine extended release (ER) (Pristiq equivalent), venlafaxine, duloxetine(tried)), AND two selective serotonin reuptake inhibitors (SSRI) (e.g. sertraline, citalopram, paroxetine, fluoxetine, escitalopram). Please look at the formulary to see what drugs are covered. Prior authorization may be required and quantity limits may apply to covered drugs. ADDITIONAL INFORMATION FOR YOUR HEALTH CARE PROVIDER: This request has not been approved because this drug is not on formulary. An exception to allow coverage of a non-formulary drug may be granted if all conditions in our Coverage Determinations - Exceptions policy are met. From the information we have received, the member does not meet number 2 of the exception policy criteria. The reason for denial is explained to the member above. The  criteria from the policy are listed here. 1) The drug is being used for a condition approved by the [place] (FDA). 2) All formulary alternatives have been tried or medical reasons have been provided why all other covered drugs cannot be tried. 3) Records have been received showing the requested drug is medically necessary. These should include relevant medical history and lab results, past treatments tried with dates of trial and responses, and any other evidence to show the covered drugs are likely to be ineffective or unsafe for the member. 4) Prescription drug samples w.Marland KitchenMarland Kitchen

## 2021-08-08 NOTE — Telephone Encounter (Signed)
Patient's grandmother lm requesting a call from Natasha Pope to discuss her medication. Natasha Pope is listed on the DPR as a person to contact. # [phone].

## 2021-08-09 NOTE — Telephone Encounter (Signed)
Contacted [facility] again this morning to get a cost for pt's Auvelity, the pharmacy tech reports that her cost was $10.00 and it was already paid for. It will be mailed out today.   Pt and grandmother are aware there are samples at the office available for pick up.

## 2021-08-09 NOTE — Telephone Encounter (Signed)
Thanks for the help. Pt has been told multiple time that it was coming via mail. Sorry for all the leg work. Thank you.

## 2021-08-09 NOTE — Telephone Encounter (Signed)
Rtc to Natasha Pope, pt's grandmother was very concerned because [name] told her that they had to drive to [place] to pick up the [place]. [name] told her she would [place] there, I corrected the confusion and reassured her that would not be the case and they would mail her the medication.

## 2021-08-09 NOTE — Telephone Encounter (Signed)
Awesome, thank you so much for all your help.

## 2021-08-09 NOTE — Telephone Encounter (Signed)
Please call pt for brian

## 2021-08-15 ENCOUNTER — Ambulatory Visit: Payer: 59 | Admitting: Behavioral Health

## 2021-08-28 ENCOUNTER — Ambulatory Visit (INDEPENDENT_AMBULATORY_CARE_PROVIDER_SITE_OTHER): Payer: 59 | Admitting: Registered Nurse

## 2021-08-28 ENCOUNTER — Encounter: Payer: Self-pay | Admitting: Registered Nurse

## 2021-08-28 ENCOUNTER — Other Ambulatory Visit: Payer: Self-pay

## 2021-08-28 VITALS — BP 101/72 | HR 94 | Temp 98.3°F | Resp 18 | Ht 66.0 in | Wt 171.8 lb

## 2021-08-28 DIAGNOSIS — G43709 Chronic migraine without aura, not intractable, without status migrainosus: Secondary | ICD-10-CM

## 2021-08-28 DIAGNOSIS — M25531 Pain in right wrist: Secondary | ICD-10-CM | POA: Diagnosis not present

## 2021-08-28 DIAGNOSIS — M25532 Pain in left wrist: Secondary | ICD-10-CM

## 2021-08-28 DIAGNOSIS — Z0000 Encounter for general adult medical examination without abnormal findings: Secondary | ICD-10-CM | POA: Diagnosis not present

## 2021-08-28 MED ORDER — DICLOFENAC SODIUM 1 % EX GEL: 2.0000 g | Freq: Four times a day (QID) | CUTANEOUS | 1 refills | Status: DC

## 2021-08-28 MED ORDER — RIZATRIPTAN BENZOATE 10 MG PO TABS: ORAL_TABLET | ORAL | 3 refills | Status: DC

## 2021-08-28 NOTE — Patient Instructions (Addendum)
Ms. Lodico -   [place] to see you. I am THRILLED for your continued good health. Keep me in the loop if there's anything I can do to help.   See you annually for a physical (labs next time!)  Call sooner if you need anything  Thank you  Rich     If you have lab work done today you will be contacted with your lab results within the next 2 weeks.  If you have not heard from [place] then please contact us. The fastest way to get your results is to register for My Chart.   IF you received an x-ray today, you will receive an invoice from [facility]. Please contact [facility] at [phone] with questions or concerns regarding your invoice.   IF you received labwork today, you will receive an invoice from [place]. Please contact [facility] at [phone] with questions or concerns regarding your invoice.   Our billing staff will not be able to assist you with questions regarding bills from these companies.  You will be contacted with the lab results as soon as they are available. The fastest way to get your results is to activate your My Chart account. Instructions are located on the last page of this paperwork. If you have not heard from [place] regarding the results in 2 weeks, please contact this office.

## 2021-08-28 NOTE — Progress Notes (Signed)
Established Patient Office Visit  Subjective:  Patient ID: Natasha Pope, female    DOB: 20-Jul-1996  Age: 26 y.o. MRN: [id number]  CC:  Chief Complaint  Patient presents with   Annual Exam    Patient states she is here for a physical.    HPI [facility] presents for CPE  No acute concerns, feeling well   Has fortunately continued to abstain from alcohol. Doing well with behavioral health  Working at a gym - enjoys it  Does note wrist pain at radial head of each arm Intermittent. No clear aggravating or alleviating factors Does not seem to be assoc with physical activity.  Also notes she needs a refill on rizatriptan. Doing well with this. Migraines controlled with medication use   Past Medical History:  Diagnosis Date   Anxiety    Asthma    Depression    Pyelonephritis    Seizure-like activity ([place])    Sepsis ([place])     Past Surgical History:  Procedure Laterality Date   dental procedure      Family History  Problem Relation Age of Onset   Depression Mother    Miscarriages / [place] Mother    Thyroid disease Mother    Hypertension Father    Alcohol abuse Father    Learning disabilities Sister    Hyperlipidemia Maternal Grandmother    Alcohol abuse Maternal Grandfather    Alcohol abuse Paternal Grandmother    Hypertension Paternal Grandfather     Social History   Socioeconomic History   Marital status: Single    Spouse name: Not on file   Number of children: 0   Years of education: some college   Highest education level: High school graduate  Occupational History   Occupation: unemployed    Comment: Works for [employer] in [employer]   Smoking status: Former    Packs/day: 0.25    Types: Cigarettes    Quit date: 05/10/2018    Years since quitting: 3.3   Smokeless tobacco: Never  Vaping Use   Vaping Use: Never used  Substance and Sexual Activity   Alcohol use: Yes    Comment: 02/17/2021 7-9 2-3  oz pours of liquor per day   Drug use: Not Currently    Types: Marijuana    Comment: 10/03/20: No longer using. Last use was 4 months ago.   Sexual activity: Not Currently    Birth control/protection: Pill  Other Topics Concern   Not on file  Social History Narrative   Currently living in [facility], a half-way home.  She has 5 roommates.   Social Determinants of Health   Financial Resource Strain: Not on file  Food Insecurity: Not on file  Transportation Needs: Not on file  Physical Activity: Not on file  Stress: Not on file  Social Connections: Not on file  Intimate Partner Violence: Not on file    Outpatient Medications Prior to Visit  Medication Sig Dispense Refill   ARIPiprazole (ABILIFY) 10 MG tablet Take 1 tablet (10 mg total) by mouth daily. 30 tablet 2   busPIRone (BUSPAR) 15 MG tablet Take 1 tablet (15 mg total) by mouth 3 (three) times daily. 90 tablet 2   Dextromethorphan-buPROPion ER (AUVELITY) 45-105 MG TBCR Take 1 tablet by mouth 2 (two) times daily. 60 tablet 2   DULoxetine (CYMBALTA) 30 MG capsule Take 1 capsule (30 mg total) by mouth 2 (two) times daily. 60 capsule 2   naltrexone (DEPADE)  50 MG tablet Take 1 tablet (50 mg total) by mouth daily. 90 tablet 2   norethindrone-ethinyl estradiol-FE (HAILEY FE 1/20) 1-20 MG-MCG tablet Take 1 tablet by mouth daily. 84 tablet 4   propranolol (INDERAL) 20 MG tablet Take 1 tablet (20 mg total) by mouth 3 (three) times daily. 90 tablet 2   QUEtiapine (SEROQUEL) 50 MG tablet Take 1 tablet (50 mg total) by mouth at bedtime. 30 tablet 2   traZODone (DESYREL) 100 MG tablet Take 1 tablet (100 mg total) by mouth at bedtime. 30 tablet 2   triamcinolone cream (KENALOG) 0.1 % Apply to affected area 1-2 times daily 30 g 0   zonisamide (ZONEGRAN) 100 MG capsule Take 1 capsule (100 mg total) by mouth 2 (two) times daily. 60 capsule 11   rizatriptan (MAXALT) 10 MG tablet TAKE ONE TABLET BY MOUTH AS NEEDED *may repeat in 2 hours if  needed* 10 tablet 1   cetirizine (ZYRTEC) 10 MG tablet Take 1 tablet (10 mg total) by mouth daily. (Patient not taking: Reported on 05/25/2021) 365 tablet 0   potassium chloride (KLOR-CON) 10 MEQ tablet Take 1 tablet (10 mEq total) by mouth daily. 30 tablet 0   No facility-administered medications prior to visit.    Allergies  Allergen Reactions   Lamotrigine Hives    ROS Review of Systems  Constitutional: Negative.   HENT: Negative.    Eyes: Negative.   Respiratory: Negative.    Cardiovascular: Negative.   Gastrointestinal: Negative.   Genitourinary: Negative.   Musculoskeletal: Negative.   Skin: Negative.   Neurological: Negative.   Psychiatric/Behavioral: Negative.    All other systems reviewed and are negative.    Objective:    Physical Exam Vitals and nursing note reviewed.  Constitutional:      General: She is not in acute distress.    Appearance: Normal appearance. She is normal weight. She is not ill-appearing, toxic-appearing or diaphoretic.  HENT:     Head: Normocephalic and atraumatic.     Right Ear: Tympanic membrane, ear canal and external ear normal. There is no impacted cerumen.     Left Ear: Tympanic membrane, ear canal and external ear normal. There is no impacted cerumen.     Nose: Nose normal. No congestion or rhinorrhea.     Mouth/Throat:     Mouth: Mucous membranes are moist.     Pharynx: Oropharynx is clear. No oropharyngeal exudate or posterior oropharyngeal erythema.  Eyes:     General: No scleral icterus.       Right eye: No discharge.        Left eye: No discharge.     Extraocular Movements: Extraocular movements intact.     Conjunctiva/sclera: Conjunctivae normal.     Pupils: Pupils are equal, round, and reactive to light.  Cardiovascular:     Rate and Rhythm: Normal rate and regular rhythm.     Pulses: Normal pulses.     Heart sounds: Normal heart sounds. No murmur heard.   No friction rub. No gallop.  Pulmonary:     Effort: Pulmonary  effort is normal. No respiratory distress.     Breath sounds: Normal breath sounds. No stridor. No wheezing, rhonchi or rales.  Chest:     Chest wall: No tenderness.  Abdominal:     General: Abdomen is flat. Bowel sounds are normal. There is no distension.     Palpations: Abdomen is soft. There is no mass.     Tenderness: There is no abdominal tenderness.  There is no right CVA tenderness, left CVA tenderness, guarding or rebound.     Hernia: No hernia is present.  Musculoskeletal:        General: No swelling, tenderness, deformity or signs of injury. Normal range of motion.     Right lower leg: No edema.     Left lower leg: No edema.  Skin:    General: Skin is warm and dry.     Capillary Refill: Capillary refill takes less than 2 seconds.     Coloration: Skin is not jaundiced or pale.     Findings: No bruising, erythema, lesion or rash.  Neurological:     General: No focal deficit present.     Mental Status: She is alert and oriented to person, place, and time. Mental status is at baseline.     Cranial Nerves: No cranial nerve deficit.     Sensory: No sensory deficit.     Motor: No weakness.     Coordination: Coordination normal.     Gait: Gait normal.     Deep Tendon Reflexes: Reflexes normal.  Psychiatric:        Mood and Affect: Mood normal.        Behavior: Behavior normal.        Thought Content: Thought content normal.        Judgment: Judgment normal.    BP 101/72    Pulse 94    Temp 98.3 F (36.8 C) (Temporal)    Resp 18    Ht 5' 6" (1.676 m)    Wt 171 lb 12.8 oz (77.9 kg)    SpO2 99%    BMI 27.73 kg/m  Wt Readings from Last 3 Encounters:  08/28/21 171 lb 12.8 oz (77.9 kg)  05/25/21 176 lb 12.8 oz (80.2 kg)  02/27/21 171 lb (77.6 kg)     Health Maintenance Due  Topic Date Due   COVID-19 Vaccine (1) Never done   INFLUENZA VACCINE  Never done   PAP-Cervical Cytology Screening  03/03/2021    There are no preventive care reminders to display for this  patient.  Lab Results  Component Value Date   TSH 1.34 01/24/2021   Lab Results  Component Value Date   WBC 4.7 03/01/2021   HGB 12.3 03/01/2021   HCT 38.0 03/01/2021   MCV 106.7 (H) 03/01/2021   PLT 107 (L) 03/01/2021   Lab Results  Component Value Date   NA 138 03/02/2021   K 3.6 03/02/2021   CO2 20 (L) 03/02/2021   GLUCOSE 116 (H) 03/02/2021   BUN 6 03/02/2021   CREATININE 0.60 03/02/2021   BILITOT 1.3 (H) 03/01/2021   ALKPHOS 76 03/01/2021   AST 181 (H) 03/01/2021   ALT 68 (H) 03/01/2021   PROT 6.7 03/01/2021   ALBUMIN 3.9 03/01/2021   CALCIUM 9.9 03/02/2021   ANIONGAP 13 03/02/2021   EGFR 102 10/03/2020   GFR 104.88 01/24/2021   Lab Results  Component Value Date   CHOL 254 (H) 05/04/2020   Lab Results  Component Value Date   HDL 122 05/04/2020   Lab Results  Component Value Date   LDLCALC 112 (H) 05/04/2020   Lab Results  Component Value Date   TRIG 95 05/04/2020   Lab Results  Component Value Date   CHOLHDL 2.1 05/04/2020   Lab Results  Component Value Date   HGBA1C 5.1 12/21/2020      Assessment & Plan:   Problem List Items Addressed This Visit  None Visit Diagnoses     Annual physical exam    -  Primary   Chronic migraine without aura without status migrainosus, not intractable       Relevant Medications   rizatriptan (MAXALT) 10 MG tablet   Bilateral wrist pain       Relevant Medications   diclofenac Sodium (VOLTAREN) 1 % GEL       Meds ordered this encounter  Medications   rizatriptan (MAXALT) 10 MG tablet    Sig: TAKE ONE TABLET BY MOUTH AS NEEDED *may repeat in 2 hours if needed*    Dispense:  90 tablet    Refill:  3    Order Specific Question:   Supervising Provider    Answer:   Carlota Raspberry, JEFFREY R [[id number]]   diclofenac Sodium (VOLTAREN) 1 % GEL    Sig: Apply 2 g topically 4 (four) times daily.    Dispense:  100 g    Refill:  1    Order Specific Question:   Supervising Provider    Answer:   Carlota Raspberry, JEFFREY R [[id number]]     Follow-up: Return in about 1 year (around 08/28/2022).   PLAN Unremarkable exam Defer labs until next year Return annually for CPE and labs Patient encouraged to call clinic with any questions, comments, or concerns.  Maximiano Coss, NP

## 2021-09-12 ENCOUNTER — Other Ambulatory Visit: Payer: Self-pay

## 2021-09-12 DIAGNOSIS — F5105 Insomnia due to other mental disorder: Secondary | ICD-10-CM

## 2021-09-12 MED ORDER — ONDANSETRON 4 MG PO TBDP: 4.0000 mg | ORAL_TABLET | Freq: Three times a day (TID) | ORAL | 0 refills | Status: DC | PRN

## 2021-09-12 MED ORDER — TRAZODONE HCL 100 MG PO TABS: 100.0000 mg | ORAL_TABLET | Freq: Every day | ORAL | 2 refills | Status: DC

## 2021-09-14 ENCOUNTER — Ambulatory Visit: Payer: 59 | Admitting: Behavioral Health

## 2021-09-26 ENCOUNTER — Other Ambulatory Visit: Payer: Self-pay | Admitting: Behavioral Health

## 2021-09-26 DIAGNOSIS — F411 Generalized anxiety disorder: Secondary | ICD-10-CM

## 2021-09-26 DIAGNOSIS — R441 Visual hallucinations: Secondary | ICD-10-CM

## 2021-09-26 DIAGNOSIS — F4001 Agoraphobia with panic disorder: Secondary | ICD-10-CM

## 2021-09-26 DIAGNOSIS — F331 Major depressive disorder, recurrent, moderate: Secondary | ICD-10-CM

## 2021-09-27 NOTE — Telephone Encounter (Signed)
Please schedule appt

## 2021-09-28 NOTE — Telephone Encounter (Signed)
LVM for pt to call and schedule  °

## 2021-09-29 ENCOUNTER — Encounter: Payer: Self-pay | Admitting: Registered Nurse

## 2021-09-29 ENCOUNTER — Ambulatory Visit (INDEPENDENT_AMBULATORY_CARE_PROVIDER_SITE_OTHER): Payer: 59 | Admitting: Registered Nurse

## 2021-09-29 VITALS — BP 105/95 | HR 112 | Temp 97.8°F | Resp 18 | Ht 66.0 in | Wt 172.2 lb

## 2021-09-29 DIAGNOSIS — J029 Acute pharyngitis, unspecified: Secondary | ICD-10-CM

## 2021-09-29 DIAGNOSIS — Z8742 Personal history of other diseases of the female genital tract: Secondary | ICD-10-CM | POA: Diagnosis not present

## 2021-09-29 LAB — POC COVID19 BINAXNOW: SARS Coronavirus 2 Ag: NEGATIVE

## 2021-09-29 MED ORDER — PREDNISONE 10 MG (21) PO TBPK: ORAL_TABLET | ORAL | 0 refills | Status: DC

## 2021-09-29 NOTE — Patient Instructions (Addendum)
Ms. Busey - ? ?Great to see you! Thrilled for you that the move is happening. ? ?Take prednisone. If not nearly resolved by Midday Monday, call me or send a MyChart message ? ?Continue theraflu, rest, hydrate ? ?Ok to work tomorrow if you're up for it ? ?Thanks, ? ?Rich  ? ? ? ?If you have lab work done today you will be contacted with your lab results within the next 2 weeks.  If you have not heard from [place] then please contact us. The fastest way to get your results is to register for My Chart. ? ? ?IF you received an x-ray today, you will receive an invoice from [facility]. Please contact [facility] at [phone] with questions or concerns regarding your invoice.  ? ?IF you received labwork today, you will receive an invoice from [facility]. Please contact [facility] at [phone] with questions or concerns regarding your invoice.  ? ?Our billing staff will not be able to assist you with questions regarding bills from these companies. ? ?You will be contacted with the lab results as soon as they are available. The fastest way to get your results is to activate your My Chart account. Instructions are located on the last page of this paperwork. If you have not heard from [place] regarding the results in 2 weeks, please contact this office. ?  ? ? ?

## 2021-09-29 NOTE — Progress Notes (Unsigned)
Established Patient Office Visit  Subjective:  Patient ID: Natasha Pope, female    DOB: 08/25/1995  Age: 26 y.o. MRN: [id number]  CC:  Chief Complaint  Patient presents with   Sore Throat    Patient states she has been experiencing some sore throat, ear pain , and headache since yesterday also loss of taste     HPI [facility] presents for sore throat  Onset yesterday Some ear pressure and pain, no drainage or muffled hearing.  Some loss of taste No sick contacts or known exposure Has not taken home covid test. Has taken theraflu, dayquil Has helped somewhat Has taken advil, helped with headache.   Past Medical History:  Diagnosis Date   Anxiety    Asthma    Depression    Pyelonephritis    Seizure-like activity ([facility])    Sepsis ([facility])     Past Surgical History:  Procedure Laterality Date   dental procedure      Family History  Problem Relation Age of Onset   Depression Mother    Miscarriages / [place] Mother    Thyroid disease Mother    Hypertension Father    Alcohol abuse Father    Learning disabilities Sister    Hyperlipidemia Maternal Grandmother    Alcohol abuse Maternal Grandfather    Alcohol abuse Paternal Grandmother    Hypertension Paternal Grandfather     Social History   Socioeconomic History   Marital status: Single    Spouse name: Not on file   Number of children: 0   Years of education: some college   Highest education level: High school graduate  Occupational History   Occupation: unemployed    Comment: Works for [employer] in [employer]  Tobacco Use   Smoking status: Former    Packs/day: 0.25    Types: Cigarettes    Quit date: 05/10/2018    Years since quitting: 3.3   Smokeless tobacco: Never  Vaping Use   Vaping Use: Never used  Substance and Sexual Activity   Alcohol use: Yes    Comment: 02/17/2021 7-9 2-3 oz pours of liquor per day   Drug use: Not Currently    Types: Marijuana    Comment:  10/03/20: No longer using. Last use was 4 months ago.   Sexual activity: Not Currently    Birth control/protection: Pill  Other Topics Concern   Not on file  Social History Narrative   Currently living in [facility], a half-way home.  She has 5 roommates.   Social Determinants of Health   Financial Resource Strain: Not on file  Food Insecurity: Not on file  Transportation Needs: Not on file  Physical Activity: Not on file  Stress: Not on file  Social Connections: Not on file  Intimate Partner Violence: Not on file    Outpatient Medications Prior to Visit  Medication Sig Dispense Refill   ARIPiprazole (ABILIFY) 10 MG tablet TAKE ONE TABLET BY MOUTH ONE TIME DAILY 30 tablet 0   busPIRone (BUSPAR) 15 MG tablet TAKE ONE TABLET BY MOUTH THREE TIMES DAILY 90 tablet 0   Dextromethorphan-buPROPion ER (AUVELITY) 45-105 MG TBCR Take 1 tablet by mouth 2 (two) times daily. 60 tablet 2   diclofenac Sodium (VOLTAREN) 1 % GEL Apply 2 g topically 4 (four) times daily. 100 g 1   DULoxetine (CYMBALTA) 30 MG capsule TAKE ONE CAPSULE BY MOUTH TWICE DAILY 60 capsule 0   naltrexone (DEPADE) 50 MG tablet Take 1 tablet (50 mg  total) by mouth daily. 90 tablet 2   norethindrone-ethinyl estradiol-FE (HAILEY FE 1/20) 1-20 MG-MCG tablet Take 1 tablet by mouth daily. 84 tablet 4   ondansetron (ZOFRAN-ODT) 4 MG disintegrating tablet Take 1 tablet (4 mg total) by mouth every 8 (eight) hours as needed for nausea or vomiting. 20 tablet 0   propranolol (INDERAL) 20 MG tablet Take 1 tablet (20 mg total) by mouth 3 (three) times daily. 90 tablet 2   QUEtiapine (SEROQUEL) 50 MG tablet Take 1 tablet (50 mg total) by mouth at bedtime. 30 tablet 2   rizatriptan (MAXALT) 10 MG tablet TAKE ONE TABLET BY MOUTH AS NEEDED *may repeat in 2 hours if needed* 90 tablet 3   traZODone (DESYREL) 100 MG tablet Take 1 tablet (100 mg total) by mouth at bedtime. 30 tablet 2   triamcinolone cream (KENALOG) 0.1 % Apply to affected area 1-2  times daily 30 g 0   zonisamide (ZONEGRAN) 100 MG capsule Take 1 capsule (100 mg total) by mouth 2 (two) times daily. 60 capsule 11   cetirizine (ZYRTEC) 10 MG tablet Take 1 tablet (10 mg total) by mouth daily. (Patient not taking: Reported on 05/25/2021) 365 tablet 0   potassium chloride (KLOR-CON) 10 MEQ tablet Take 1 tablet (10 mEq total) by mouth daily. 30 tablet 0   No facility-administered medications prior to visit.    Allergies  Allergen Reactions   Lamotrigine Hives    ROS Review of Systems  Constitutional: Negative.   HENT:  Positive for congestion, ear pain, postnasal drip and sinus pressure.   Eyes: Negative.   Respiratory: Negative.    Cardiovascular: Negative.   Gastrointestinal: Negative.   Endocrine: Negative.   Genitourinary: Negative.   Musculoskeletal: Negative.   Skin: Negative.   Allergic/Immunologic: Negative.   Neurological: Negative.   Hematological: Negative.   Psychiatric/Behavioral: Negative.    All other systems reviewed and are negative.    Objective:    Physical Exam  BP (!) 105/95    Pulse (!) 112    Temp 97.8 F (36.6 C) (Temporal)    Resp 18    Ht $R'5\' 6"'KN$  (1.676 m)    Wt 172 lb 3.2 oz (78.1 kg)    SpO2 99%    BMI 27.79 kg/m  Wt Readings from Last 3 Encounters:  09/29/21 172 lb 3.2 oz (78.1 kg)  08/28/21 171 lb 12.8 oz (77.9 kg)  05/25/21 176 lb 12.8 oz (80.2 kg)     Health Maintenance Due  Topic Date Due   COVID-19 Vaccine (1) Never done   INFLUENZA VACCINE  Never done   PAP-Cervical Cytology Screening  03/03/2021   PAP SMEAR-Modifier  03/03/2021    There are no preventive care reminders to display for this patient.  Lab Results  Component Value Date   TSH 1.34 01/24/2021   Lab Results  Component Value Date   WBC 4.7 03/01/2021   HGB 12.3 03/01/2021   HCT 38.0 03/01/2021   MCV 106.7 (H) 03/01/2021   PLT 107 (L) 03/01/2021   Lab Results  Component Value Date   NA 138 03/02/2021   K 3.6 03/02/2021   CO2 20 (L)  03/02/2021   GLUCOSE 116 (H) 03/02/2021   BUN 6 03/02/2021   CREATININE 0.60 03/02/2021   BILITOT 1.3 (H) 03/01/2021   ALKPHOS 76 03/01/2021   AST 181 (H) 03/01/2021   ALT 68 (H) 03/01/2021   PROT 6.7 03/01/2021   ALBUMIN 3.9 03/01/2021   CALCIUM 9.9 03/02/2021  ANIONGAP 13 03/02/2021   EGFR 102 10/03/2020   GFR 104.88 01/24/2021   Lab Results  Component Value Date   CHOL 254 (H) 05/04/2020   Lab Results  Component Value Date   HDL 122 05/04/2020   Lab Results  Component Value Date   LDLCALC 112 (H) 05/04/2020   Lab Results  Component Value Date   TRIG 95 05/04/2020   Lab Results  Component Value Date   CHOLHDL 2.1 05/04/2020   Lab Results  Component Value Date   HGBA1C 5.1 12/21/2020      Assessment & Plan:   Problem List Items Addressed This Visit   None Visit Diagnoses     Sore throat    -  Primary   Relevant Medications   predniSONE (STERAPRED UNI-PAK 21 TAB) 10 MG (21) TBPK tablet   Other Relevant Orders   POC COVID-19   History of abnormal cervical Pap smear       Relevant Orders   Ambulatory referral to Gynecology       Meds ordered this encounter  Medications   predniSONE (STERAPRED UNI-PAK 21 TAB) 10 MG (21) TBPK tablet    Sig: Take per package instructions. Do not skip doses. Finish entire supply.    Dispense:  1 each    Refill:  0    Order Specific Question:   Supervising Provider    Answer:   Carlota Raspberry, JEFFREY R [[id number]]    Follow-up: Return if symptoms worsen or fail to improve.   PLAN Negative covid and strep on site Prednisone taper for suspected viral infection Return if worsening or failing to improve Patient encouraged to call clinic with any questions, comments, or concerns.  Maximiano Coss, NP

## 2021-10-02 ENCOUNTER — Telehealth: Payer: Self-pay | Admitting: Registered Nurse

## 2021-10-02 NOTE — Telephone Encounter (Signed)
Pt called in stating that she is still having the sore throat. She wanted to know what to do  ?Please advise  ?

## 2021-10-03 ENCOUNTER — Other Ambulatory Visit: Payer: Self-pay | Admitting: Registered Nurse

## 2021-10-03 DIAGNOSIS — J029 Acute pharyngitis, unspecified: Secondary | ICD-10-CM

## 2021-10-03 MED ORDER — AMOXICILLIN 875 MG PO TABS
875.0000 mg | ORAL_TABLET | Freq: Two times a day (BID) | ORAL | 0 refills | Status: AC
Start: 2021-10-03 — End: 2021-10-13

## 2021-10-03 NOTE — Telephone Encounter (Signed)
Have sent amoxicillin to her pharmacy. Twice daily x 10 days, finish entire supply even if feeling better. ? ?Thanks, ? ?Rich

## 2021-10-11 ENCOUNTER — Other Ambulatory Visit: Payer: Self-pay | Admitting: Behavioral Health

## 2021-10-11 DIAGNOSIS — F5105 Insomnia due to other mental disorder: Secondary | ICD-10-CM

## 2021-10-11 DIAGNOSIS — F411 Generalized anxiety disorder: Secondary | ICD-10-CM

## 2021-10-12 NOTE — Telephone Encounter (Signed)
Voicemail full.

## 2021-10-12 NOTE — Telephone Encounter (Signed)
Please call patient to schedule an appt. Last seen in Dec with RTC in 8 weeks. Last 2 appt were canceled by provider. Thank you. ?

## 2021-10-25 ENCOUNTER — Other Ambulatory Visit: Payer: Self-pay | Admitting: Behavioral Health

## 2021-10-25 DIAGNOSIS — F411 Generalized anxiety disorder: Secondary | ICD-10-CM

## 2021-10-25 DIAGNOSIS — R441 Visual hallucinations: Secondary | ICD-10-CM

## 2021-10-25 DIAGNOSIS — F4001 Agoraphobia with panic disorder: Secondary | ICD-10-CM

## 2021-10-25 DIAGNOSIS — F331 Major depressive disorder, recurrent, moderate: Secondary | ICD-10-CM

## 2021-10-30 ENCOUNTER — Other Ambulatory Visit: Payer: Self-pay | Admitting: Behavioral Health

## 2021-11-02 ENCOUNTER — Other Ambulatory Visit: Payer: Self-pay | Admitting: Behavioral Health

## 2021-11-02 DIAGNOSIS — F330 Major depressive disorder, recurrent, mild: Secondary | ICD-10-CM

## 2021-11-02 DIAGNOSIS — F411 Generalized anxiety disorder: Secondary | ICD-10-CM

## 2021-11-02 MED ORDER — AUVELITY 45-105 MG PO TBCR: 1.0000 | EXTENDED_RELEASE_TABLET | Freq: Two times a day (BID) | ORAL | 3 refills | Status: DC

## 2021-11-14 ENCOUNTER — Other Ambulatory Visit: Payer: Self-pay | Admitting: Behavioral Health

## 2021-11-14 DIAGNOSIS — F5105 Insomnia due to other mental disorder: Secondary | ICD-10-CM

## 2021-11-14 DIAGNOSIS — F411 Generalized anxiety disorder: Secondary | ICD-10-CM

## 2021-11-14 NOTE — Telephone Encounter (Signed)
Please call patient to schedule an appt.-

## 2021-11-16 NOTE — Telephone Encounter (Signed)
Pt has an appt 5/1 ?

## 2021-11-20 ENCOUNTER — Ambulatory Visit: Payer: 59 | Admitting: Behavioral Health

## 2021-11-23 ENCOUNTER — Ambulatory Visit: Payer: 59 | Admitting: Behavioral Health

## 2021-11-26 ENCOUNTER — Encounter: Payer: Self-pay | Admitting: Behavioral Health

## 2021-11-27 ENCOUNTER — Other Ambulatory Visit: Payer: Self-pay | Admitting: Behavioral Health

## 2021-11-27 DIAGNOSIS — F331 Major depressive disorder, recurrent, moderate: Secondary | ICD-10-CM

## 2021-11-27 DIAGNOSIS — F411 Generalized anxiety disorder: Secondary | ICD-10-CM

## 2021-11-27 DIAGNOSIS — R441 Visual hallucinations: Secondary | ICD-10-CM

## 2021-11-27 DIAGNOSIS — F4001 Agoraphobia with panic disorder: Secondary | ICD-10-CM

## 2021-11-30 ENCOUNTER — Other Ambulatory Visit: Payer: Self-pay | Admitting: Behavioral Health

## 2021-11-30 ENCOUNTER — Telehealth: Payer: Self-pay | Admitting: Behavioral Health

## 2021-11-30 DIAGNOSIS — F331 Major depressive disorder, recurrent, moderate: Secondary | ICD-10-CM

## 2021-11-30 DIAGNOSIS — F4001 Agoraphobia with panic disorder: Secondary | ICD-10-CM

## 2021-11-30 DIAGNOSIS — R441 Visual hallucinations: Secondary | ICD-10-CM

## 2021-11-30 DIAGNOSIS — F411 Generalized anxiety disorder: Secondary | ICD-10-CM

## 2021-11-30 DIAGNOSIS — F5105 Insomnia due to other mental disorder: Secondary | ICD-10-CM

## 2021-11-30 MED ORDER — DULOXETINE HCL 30 MG PO CPEP: 30.0000 mg | ORAL_CAPSULE | Freq: Two times a day (BID) | ORAL | 0 refills | Status: DC

## 2021-11-30 MED ORDER — QUETIAPINE FUMARATE 50 MG PO TABS: 50.0000 mg | ORAL_TABLET | Freq: Every day | ORAL | 0 refills | Status: DC

## 2021-11-30 MED ORDER — ARIPIPRAZOLE 10 MG PO TABS: 10.0000 mg | ORAL_TABLET | Freq: Every day | ORAL | 3 refills | Status: DC

## 2021-11-30 MED ORDER — BUSPIRONE HCL 15 MG PO TABS: 15.0000 mg | ORAL_TABLET | Freq: Three times a day (TID) | ORAL | 0 refills | Status: DC

## 2021-11-30 MED ORDER — PROPRANOLOL HCL 20 MG PO TABS: 20.0000 mg | ORAL_TABLET | Freq: Three times a day (TID) | ORAL | 0 refills | Status: DC

## 2021-11-30 NOTE — Telephone Encounter (Signed)
[place] grandmother called asking you to return her call @ [phone]. Would like to give info about Pt. Mentioned she had been to a few apts and talked with you in the past. ?

## 2021-12-04 ENCOUNTER — Other Ambulatory Visit: Payer: Self-pay | Admitting: Behavioral Health

## 2021-12-04 DIAGNOSIS — F411 Generalized anxiety disorder: Secondary | ICD-10-CM

## 2021-12-04 DIAGNOSIS — R441 Visual hallucinations: Secondary | ICD-10-CM

## 2021-12-04 DIAGNOSIS — F4001 Agoraphobia with panic disorder: Secondary | ICD-10-CM

## 2021-12-04 DIAGNOSIS — F331 Major depressive disorder, recurrent, moderate: Secondary | ICD-10-CM

## 2021-12-04 NOTE — Telephone Encounter (Signed)
Reached out to patients grandmother directly. Note made. No further action at this time.
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
## 2021-12-21 ENCOUNTER — Ambulatory Visit: Payer: 59 | Admitting: Behavioral Health

## 2021-12-27 ENCOUNTER — Ambulatory Visit (INDEPENDENT_AMBULATORY_CARE_PROVIDER_SITE_OTHER): Payer: 59 | Admitting: Behavioral Health

## 2021-12-27 DIAGNOSIS — F5105 Insomnia due to other mental disorder: Secondary | ICD-10-CM

## 2021-12-27 DIAGNOSIS — F411 Generalized anxiety disorder: Secondary | ICD-10-CM

## 2021-12-27 DIAGNOSIS — F1010 Alcohol abuse, uncomplicated: Secondary | ICD-10-CM

## 2021-12-27 DIAGNOSIS — F99 Mental disorder, not otherwise specified: Secondary | ICD-10-CM

## 2021-12-27 DIAGNOSIS — F4001 Agoraphobia with panic disorder: Secondary | ICD-10-CM | POA: Diagnosis not present

## 2021-12-27 DIAGNOSIS — F331 Major depressive disorder, recurrent, moderate: Secondary | ICD-10-CM | POA: Diagnosis not present

## 2021-12-27 MED ORDER — NALTREXONE HCL 50 MG PO TABS: 50.0000 mg | ORAL_TABLET | Freq: Every day | ORAL | 3 refills | Status: DC

## 2021-12-27 MED ORDER — PROPRANOLOL HCL 20 MG PO TABS: 20.0000 mg | ORAL_TABLET | Freq: Three times a day (TID) | ORAL | 3 refills | Status: DC

## 2021-12-27 MED ORDER — BUSPIRONE HCL 15 MG PO TABS: 15.0000 mg | ORAL_TABLET | Freq: Three times a day (TID) | ORAL | 3 refills | Status: DC

## 2021-12-27 MED ORDER — QUETIAPINE FUMARATE 50 MG PO TABS: 50.0000 mg | ORAL_TABLET | Freq: Every day | ORAL | 3 refills | Status: DC

## 2021-12-27 MED ORDER — DULOXETINE HCL 30 MG PO CPEP: 30.0000 mg | ORAL_CAPSULE | Freq: Two times a day (BID) | ORAL | 3 refills | Status: DC

## 2021-12-27 NOTE — Progress Notes (Signed)
[facility]  Patient ID: Natasha Pope,  MRN: [id number]  PCP: Janeece Agee, NP  Date of Evaluation: 12/27/2021 Time spent:30 minutes  Chief Complaint:  Chief Complaint   Follow-up; Medication Refill; Anxiety; Depression; Alcohol Problem; Patient Education     HISTORY/CURRENT STATUS: HPI  26 year old female presents to this office for follow up and medication management. She is sluggish, appears to be under the influence of uknown substance or ETOH. She denies all. Says that she stopped her Abilify and does not want to restart. She seems to be confused about refills. I reassured her that her refills for regular medications have been sent to [facility] weeks ago. She is very restricted today and appears to be anxious to leave appt.   She says her anxiety level today is 2/10 but her depression is 3/10. She keep reassuring me that she is ok.  She does want to makes changes to medications to treat her depression. Says she is working full time in [employer] and now staying in Extended Stay in [place]. Says that she has no social life and just works and sleeps.  She appears distressed but denies same. Says that her grandmother is available if she needs support. Says she is sleeping 7 hours per night. She denies any mania, no psychosis. No SI/HI. She has verbally contracted for safety with this Clinical research associate and understands resources available in emergency.    Past psychiatric medication trials; Pt cannot advise on complete list. Naltrexone Duloxetine Buproprion Klonopin Wellbutrin Trazodone Abilify         Individual Medical History/ Review of Systems: Changes? :No   Allergies: Lamotrigine  Current Medications:  Current Outpatient Medications:    busPIRone (BUSPAR) 15 MG tablet, Take 1 tablet (15 mg total) by mouth 3 (three) times daily., Disp: 90 tablet, Rfl: 3   cetirizine (ZYRTEC) 10 MG tablet, Take 1 tablet (10 mg total) by mouth daily. (Patient not taking:  Reported on 05/25/2021), Disp: 365 tablet, Rfl: 0   Dextromethorphan-buPROPion ER (AUVELITY) 45-105 MG TBCR, Take 1 tablet by mouth 2 (two) times daily., Disp: 60 tablet, Rfl: 3   diclofenac Sodium (VOLTAREN) 1 % GEL, Apply 2 g topically 4 (four) times daily., Disp: 100 g, Rfl: 1   DULoxetine (CYMBALTA) 30 MG capsule, Take 1 capsule (30 mg total) by mouth 2 (two) times daily., Disp: 60 capsule, Rfl: 3   naltrexone (DEPADE) 50 MG tablet, Take 1 tablet (50 mg total) by mouth daily., Disp: 90 tablet, Rfl: 3   norethindrone-ethinyl estradiol-FE (HAILEY FE 1/20) 1-20 MG-MCG tablet, Take 1 tablet by mouth daily., Disp: 84 tablet, Rfl: 4   ondansetron (ZOFRAN-ODT) 4 MG disintegrating tablet, Take 1 tablet (4 mg total) by mouth every 8 (eight) hours as needed for nausea or vomiting., Disp: 20 tablet, Rfl: 0   potassium chloride (KLOR-CON) 10 MEQ tablet, Take 1 tablet (10 mEq total) by mouth daily., Disp: 30 tablet, Rfl: 0   predniSONE (STERAPRED UNI-PAK 21 TAB) 10 MG (21) TBPK tablet, Take per package instructions. Do not skip doses. Finish entire supply., Disp: 1 each, Rfl: 0   propranolol (INDERAL) 20 MG tablet, Take 1 tablet (20 mg total) by mouth 3 (three) times daily., Disp: 90 tablet, Rfl: 3   QUEtiapine (SEROQUEL) 50 MG tablet, Take 1 tablet (50 mg total) by mouth at bedtime., Disp: 30 tablet, Rfl: 3   rizatriptan (MAXALT) 10 MG tablet, TAKE ONE TABLET BY MOUTH AS NEEDED *may repeat in 2 hours if needed*, Disp:  90 tablet, Rfl: 3   traZODone (DESYREL) 100 MG tablet, Take 1 tablet (100 mg total) by mouth at bedtime., Disp: 30 tablet, Rfl: 2   triamcinolone cream (KENALOG) 0.1 %, Apply to affected area 1-2 times daily, Disp: 30 g, Rfl: 0   zonisamide (ZONEGRAN) 100 MG capsule, Take 1 capsule (100 mg total) by mouth 2 (two) times daily., Disp: 60 capsule, Rfl: 11 Medication Side Effects: none  Family Medical/ Social History: Changes? No  MENTAL HEALTH EXAM:  There were no vitals taken for this  visit.There is no height or weight on file to calculate BMI.  General Appearance: Casual, Neat, and Well Groomed, possibly impaired, could not smell ETOH  Eye Contact:  Minimal  Speech:  Pressured and Slurred  Volume:  Decreased  Mood:  Anxious, Depressed, and Irritable  Affect:  Congruent, Depressed, Flat, Restricted, and Anxious  Thought Process:  Coherent  Orientation:  Full (Time, Place, and Person)  Thought Content: Logical   Suicidal Thoughts:  No  Homicidal Thoughts:  No  Memory:  WNL  Judgement:  Impaired  Insight:  Fair  Psychomotor Activity:  Decreased  Concentration:  Concentration: Fair  Recall:  [employer] of Knowledge: Fair  Language: Fair  Assets:  Desire for Improvement Resilience Social Support  ADL's:  Intact  Cognition: Impaired,  Mild  Prognosis:  Fair    DIAGNOSES:    ICD-10-CM   1. Generalized anxiety disorder  F41.1 DULoxetine (CYMBALTA) 30 MG capsule    propranolol (INDERAL) 20 MG tablet    busPIRone (BUSPAR) 15 MG tablet    2. Moderate episode of recurrent major depressive disorder (HCC)  F33.1 DULoxetine (CYMBALTA) 30 MG capsule    busPIRone (BUSPAR) 15 MG tablet    3. Panic disorder with agoraphobia  F40.01 DULoxetine (CYMBALTA) 30 MG capsule    busPIRone (BUSPAR) 15 MG tablet    4. 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 Grandmother who is emergency contact said she had been kicked out of [employer] [employer] after relapse. She appears slightly cognitively impaired  this visit, although she denies any ETOH.  Her eyes are droopy and speech is slow and slurred at time. I strongly suspect she is not being honest and had truly relapsed.  Grandmother says that she is now living back at Extended Stay. Natasha Pope says she is working at a [employer]. She appeared to become irritable at time when asked questions about her well being. She assured me she was ok but I am still concerned this is not true. I will request urine drug screen next visit due to medication safety concerns. She is not attending regular AA meetings.  We reconciled the medications she left the program with and made sure she had enough refills. Natasha Pope assured me she would utilize resources if she felt she was slipping backwards.     We agree to:   Pt ran out and stopped Abilify 10 mg daily. Did not want to start back. Rx was at [facility] but pt said she was not notified. RX was confirmed at [facility]. Not sure what the real reason was for not obtaining script.  Continue Auvelity 90/210 mg daily Continue Buspar  to 15 mg three times daily as needed  Continue Duloxetine 30 mg tablet twice daily (total 60 mg) morning and evening. Continue Naltrexone 50 mg daily Continue Propranolol 20 mg three times daily as needed Continue Seroquel 50 mg at bedtime Will report side effects or worsening symptoms or relapse To follow up regularly with is office and PCP as required Will follow up in 4 weeks to reassess Provided emergency contact and support information [facility]          Joan FloresBrian A Mata Rowen, NP

## 2021-12-28 ENCOUNTER — Encounter: Payer: Self-pay | Admitting: Behavioral Health

## 2022-01-18 ENCOUNTER — Other Ambulatory Visit: Payer: Self-pay | Admitting: Behavioral Health

## 2022-01-18 DIAGNOSIS — F5105 Insomnia due to other mental disorder: Secondary | ICD-10-CM

## 2022-01-22 ENCOUNTER — Other Ambulatory Visit: Payer: Self-pay | Admitting: Behavioral Health

## 2022-01-22 DIAGNOSIS — F330 Major depressive disorder, recurrent, mild: Secondary | ICD-10-CM

## 2022-01-22 DIAGNOSIS — F411 Generalized anxiety disorder: Secondary | ICD-10-CM

## 2022-01-24 ENCOUNTER — Ambulatory Visit (INDEPENDENT_AMBULATORY_CARE_PROVIDER_SITE_OTHER): Payer: Self-pay | Admitting: Behavioral Health

## 2022-01-24 DIAGNOSIS — F489 Nonpsychotic mental disorder, unspecified: Secondary | ICD-10-CM

## 2022-01-24 NOTE — Progress Notes (Signed)
Pt did not show for scheduled appointment and did not provide 24 hours notice as required. Fees to be assessed.

## 2022-03-07 ENCOUNTER — Ambulatory Visit: Payer: 59 | Admitting: Family

## 2022-03-07 ENCOUNTER — Encounter: Payer: Self-pay | Admitting: Family

## 2022-03-07 VITALS — BP 140/100 | HR 61 | Temp 97.1°F | Ht 66.0 in | Wt 180.0 lb

## 2022-03-07 DIAGNOSIS — R251 Tremor, unspecified: Secondary | ICD-10-CM

## 2022-03-07 DIAGNOSIS — E876 Hypokalemia: Secondary | ICD-10-CM

## 2022-03-07 NOTE — Progress Notes (Signed)
Patient ID: Natasha Pope, female    DOB: [birth year], 26 y.o.   MRN: [id number]  Chief Complaint  Patient presents with   Anxiety   Spasms    Pt states spasms started 2x days ago, today was at peak and it was scary for pt.     HPI: Muscle tremors/spasms:  feels muscle tremors all over, a shaking sensation, denies any paresthesias. Reports worst sx is in her face and worried about her tongue, but denies any dysphagia or tongue swelling. States sx started about 3 weeks ago and occurring off & on. Hx of alcohol abuse, in rehab last August, had several relapses since, she reports last time was in May, grandmother present and confirms this.  Pt also was sweating profusely yesterday but not today, denies any fever or cough.  Assessment & Plan:  1. Tremor pt appears with flat affect, trembling slightly in her body at times during visit, but other times calm & no trembling, voice is trembling slightly when speaking. Pt on several Psych meds and per EMR no showed for her appt in July, but pt states she did show up for appointment.  Reports taking all meds as prescribed, & denies any changes in doses or new RX recently. Denies any alcohol or other illegal drug intake. Will check labs today. Advised pt to follow up with her Psych provider,  increase her Propranolol to 91m qid and monitor heart rate. BP elevated today which is new, possibly r/t current sx. pt has f/u with new PCP here next week.  - Comp Met (CMET) - CBC with Differential/Platelet - Lyme Disease Serology w/Reflex; Future  Subjective:    Outpatient Medications Prior to Visit  Medication Sig Dispense Refill   AUVELITY 45-105 MG TBCR TAKE 1 TABLET BY MOUTH TWICE A DAY 60 tablet 3   busPIRone (BUSPAR) 15 MG tablet Take 1 tablet (15 mg total) by mouth 3 (three) times daily. 90 tablet 3   diclofenac Sodium (VOLTAREN) 1 % GEL Apply 2 g topically 4 (four) times daily. 100 g 1   DULoxetine (CYMBALTA) 30 MG capsule Take 1 capsule (30  mg total) by mouth 2 (two) times daily. 60 capsule 3   naltrexone (DEPADE) 50 MG tablet Take 1 tablet (50 mg total) by mouth daily. 90 tablet 3   norethindrone-ethinyl estradiol-FE (HAILEY FE 1/20) 1-20 MG-MCG tablet Take 1 tablet by mouth daily. 84 tablet 4   ondansetron (ZOFRAN-ODT) 4 MG disintegrating tablet Take 1 tablet (4 mg total) by mouth every 8 (eight) hours as needed for nausea or vomiting. 20 tablet 0   predniSONE (STERAPRED UNI-PAK 21 TAB) 10 MG (21) TBPK tablet Take per package instructions. Do not skip doses. Finish entire supply. 1 each 0   propranolol (INDERAL) 20 MG tablet Take 1 tablet (20 mg total) by mouth 3 (three) times daily. 90 tablet 3   QUEtiapine (SEROQUEL) 50 MG tablet Take 1 tablet (50 mg total) by mouth at bedtime. 30 tablet 3   rizatriptan (MAXALT) 10 MG tablet TAKE ONE TABLET BY MOUTH AS NEEDED *may repeat in 2 hours if needed* 90 tablet 3   traZODone (DESYREL) 100 MG tablet TAKE ONE TABLET BY MOUTH DAILY AT BEDTIME 30 tablet 0   triamcinolone cream (KENALOG) 0.1 % Apply to affected area 1-2 times daily 30 g 0   cetirizine (ZYRTEC) 10 MG tablet Take 1 tablet (10 mg total) by mouth daily. (Patient not taking: Reported on 05/25/2021) 365 tablet 0   potassium chloride (  KLOR-CON) 10 MEQ tablet Take 1 tablet (10 mEq total) by mouth daily. 30 tablet 0   zonisamide (ZONEGRAN) 100 MG capsule Take 1 capsule (100 mg total) by mouth 2 (two) times daily. (Patient not taking: Reported on 03/07/2022) 60 capsule 11   No facility-administered medications prior to visit.   Past Medical History:  Diagnosis Date   Anxiety    Asthma    Depression    Pyelonephritis    Seizure-like activity ([place])    Sepsis ([place])    Past Surgical History:  Procedure Laterality Date   dental procedure     Allergies  Allergen Reactions   Lamotrigine Hives      Objective:    Physical Exam Vitals and nursing note reviewed.  Constitutional:      Appearance: Normal appearance.   Cardiovascular:     Rate and Rhythm: Normal rate and regular rhythm.  Pulmonary:     Effort: Pulmonary effort is normal.     Breath sounds: Normal breath sounds.  Musculoskeletal:        General: Normal range of motion.  Skin:    General: Skin is warm and dry.  Neurological:     Mental Status: She is alert.     Cranial Nerves: Cranial nerves 2-12 are intact.     Sensory: Sensation is intact.     Motor: Motor function is intact.     Coordination: Coordination is intact.     Gait: Gait is intact.  Psychiatric:        Mood and Affect: Mood normal.        Behavior: Behavior normal.    There were no vitals taken for this visit. Wt Readings from Last 3 Encounters:  09/29/21 172 lb 3.2 oz (78.1 kg)  08/28/21 171 lb 12.8 oz (77.9 kg)  05/25/21 176 lb 12.8 oz (80.2 kg)       Jeanie Sewer, NP

## 2022-03-07 NOTE — Patient Instructions (Signed)
It was very nice to see you today!   I will review your lab results via MyChart in a few days.  Increase your Propranolol to 1 pill 4 times per day to see if this helps your tremors.  Follow up with your psychiatric provider.   Keep your appointment next week with Dr. Jon Billings.      PLEASE NOTE:  If you had any lab tests please let us know if you have not heard back within a few days. You may see your results on MyChart before we have a chance to review them but we will give you a call once they are reviewed by [place]. If we ordered any referrals today, please let us know if you have not heard from their office within the next week.

## 2022-03-08 ENCOUNTER — Ambulatory Visit: Payer: 59 | Admitting: Behavioral Health

## 2022-03-08 ENCOUNTER — Other Ambulatory Visit: Payer: 59

## 2022-03-08 DIAGNOSIS — R251 Tremor, unspecified: Secondary | ICD-10-CM

## 2022-03-08 LAB — CBC WITH DIFFERENTIAL/PLATELET
Absolute Monocytes: 332 cells/uL (ref 200–950)
Basophils Absolute: 0.1 10*3/uL (ref 0.0–0.1)
Basophils Absolute: 33 cells/uL (ref 0–200)
Basophils Relative: 1.8 % (ref 0.0–3.0)
Basophils Relative: 0.5 %
Eosinophils Absolute: 0 10*3/uL (ref 0.0–0.7)
Eosinophils Absolute: 33 cells/uL (ref 15–500)
Eosinophils Relative: 0.6 % (ref 0.0–5.0)
Eosinophils Relative: 0.5 %
HCT: 35.5 % — ABNORMAL LOW (ref 36.0–46.0)
HCT: 33.8 % — ABNORMAL LOW (ref 35.0–45.0)
Hemoglobin: 11.9 g/dL — ABNORMAL LOW (ref 12.0–15.0)
Hemoglobin: 12 g/dL (ref 11.7–15.5)
Lymphocytes Relative: 30.6 % (ref 12.0–46.0)
Lymphs Abs: 1.9 10*3/uL (ref 0.7–4.0)
Lymphs Abs: 2035 cells/uL (ref 850–3900)
MCH: 34.9 pg — ABNORMAL HIGH (ref 27.0–33.0)
MCHC: 33.4 g/dL (ref 30.0–36.0)
MCHC: 35.5 g/dL (ref 32.0–36.0)
MCV: 103.9 fl — ABNORMAL HIGH (ref 78.0–100.0)
MCV: 98.3 fL (ref 80.0–100.0)
MPV: 10 fL (ref 7.5–12.5)
Monocytes Absolute: 0.3 10*3/uL (ref 0.1–1.0)
Monocytes Relative: 5.4 % (ref 3.0–12.0)
Monocytes Relative: 5.1 %
Neutro Abs: 3.9 10*3/uL (ref 1.4–7.7)
Neutro Abs: 4069 cells/uL (ref 1500–7800)
Neutrophils Relative %: 61.6 % (ref 43.0–77.0)
Neutrophils Relative %: 62.6 %
Platelets: 308 10*3/uL (ref 150.0–400.0)
Platelets: 323 10*3/uL (ref 140–400)
RBC: 3.42 Mil/uL — ABNORMAL LOW (ref 3.87–5.11)
RBC: 3.44 10*6/uL — ABNORMAL LOW (ref 3.80–5.10)
RDW: 26.1 % — ABNORMAL HIGH (ref 11.5–15.5)
RDW: 22.9 % — ABNORMAL HIGH (ref 11.0–15.0)
Total Lymphocyte: 31.3 %
WBC: 6.4 10*3/uL (ref 4.0–10.5)
WBC: 6.5 10*3/uL (ref 3.8–10.8)

## 2022-03-08 LAB — COMPREHENSIVE METABOLIC PANEL
ALT: 15 U/L (ref 0–35)
AST: 25 U/L (ref 0–37)
Albumin: 4.6 g/dL (ref 3.5–5.2)
Alkaline Phosphatase: 53 U/L (ref 39–117)
BUN: 4 mg/dL — ABNORMAL LOW (ref 6–23)
CO2: 24 mEq/L (ref 19–32)
Calcium: 8.7 mg/dL (ref 8.4–10.5)
Chloride: 103 mEq/L (ref 96–112)
Creatinine, Ser: 0.75 mg/dL (ref 0.40–1.20)
GFR: 110.76 mL/min (ref >60.00)
Glucose, Bld: 98 mg/dL (ref 70–99)
Potassium: 2.9 mEq/L — ABNORMAL LOW (ref 3.5–5.1)
Sodium: 140 mEq/L (ref 135–145)
Total Bilirubin: 0.3 mg/dL (ref 0.2–1.2)
Total Protein: 7.6 g/dL (ref 6.0–8.3)

## 2022-03-08 MED ORDER — POTASSIUM CHLORIDE ER 10 MEQ PO TBCR: 10.0000 meq | EXTENDED_RELEASE_TABLET | ORAL | 1 refills | Status: DC

## 2022-03-08 NOTE — Addendum Note (Signed)
Addended byDulce Sellar on: 03/08/2022 01:24 PM   Modules accepted: Orders

## 2022-03-08 NOTE — Progress Notes (Signed)
Natasha Pope and let her know her potassium is too low and probably why she is having her symptoms. Potassium is very important for heart function - has she been vomiting or having diarrhea in last few days?  I have sent a potassium supplement to her pharmacy (she has taken this before). Start with 2 pills twice a day TODAY ONLY, then go down to 2 pills for next 3 days and then 1 pill qd - instructions are on the bottle. She needs to come back in 1 week to recheck her level (BMP) - or she can do this at her visit with Dr Jon Billings next week. Add the lab please. Let me know what she says - thx

## 2022-03-09 LAB — LYME DISEASE SEROLOGY W/REFLEX: Lyme Total Antibody EIA: NEGATIVE

## 2022-03-11 NOTE — Progress Notes (Signed)
Lyme test negative. Wanted to rule this out as possible cause of her symptoms. But believe they are from her low potassium. If she doesn't read the MyChart msg -try to reach her again please to be sure she picked up the potassium, is taking, and has scheduled a f/u lab appointment, or planning to do at her appt w/Dr Jon Billings?  Thanks!

## 2022-03-15 ENCOUNTER — Encounter: Payer: 59 | Admitting: Internal Medicine

## 2022-03-19 ENCOUNTER — Telehealth: Payer: Self-pay | Admitting: Internal Medicine

## 2022-03-19 ENCOUNTER — Encounter: Payer: Self-pay | Admitting: Internal Medicine

## 2022-03-19 ENCOUNTER — Ambulatory Visit: Payer: 59 | Admitting: Internal Medicine

## 2022-03-19 VITALS — BP 120/88 | HR 79 | Temp 98.2°F | Resp 14 | Ht 66.0 in | Wt 183.0 lb

## 2022-03-19 DIAGNOSIS — Z3009 Encounter for other general counseling and advice on contraception: Secondary | ICD-10-CM

## 2022-03-19 DIAGNOSIS — E876 Hypokalemia: Secondary | ICD-10-CM | POA: Diagnosis not present

## 2022-03-19 DIAGNOSIS — F10229 Alcohol dependence with intoxication, unspecified: Secondary | ICD-10-CM

## 2022-03-19 DIAGNOSIS — F4310 Post-traumatic stress disorder, unspecified: Secondary | ICD-10-CM

## 2022-03-19 DIAGNOSIS — F1094 Alcohol use, unspecified with alcohol-induced mood disorder: Secondary | ICD-10-CM | POA: Diagnosis not present

## 2022-03-19 DIAGNOSIS — R252 Cramp and spasm: Secondary | ICD-10-CM

## 2022-03-19 DIAGNOSIS — F1010 Alcohol abuse, uncomplicated: Secondary | ICD-10-CM

## 2022-03-19 HISTORY — DX: Post-traumatic stress disorder, unspecified: F43.10

## 2022-03-19 MED ORDER — METHOCARBAMOL 500 MG PO TABS: 500.0000 mg | ORAL_TABLET | Freq: Four times a day (QID) | ORAL | 3 refills | Status: DC

## 2022-03-19 MED ORDER — NORETHIN ACE-ETH ESTRAD-FE 1-20 MG-MCG PO TABS: 1.0000 | ORAL_TABLET | Freq: Every day | ORAL | 4 refills | Status: DC

## 2022-03-19 MED ORDER — ACAMPROSATE CALCIUM 333 MG PO TBEC: 666.0000 mg | DELAYED_RELEASE_TABLET | Freq: Three times a day (TID) | ORAL | Status: DC

## 2022-03-19 NOTE — Assessment & Plan Note (Signed)
We didn't go into details but she confessed she has h/o Will see if we can get emdr

## 2022-03-19 NOTE — Progress Notes (Signed)
Today's healthcare provider: Loralee Pacas, MD  Phone: [phone]  New patient visit  Visit Date: 03/19/2022 Patient: Natasha Pope [place]   DOB: [birth year]   26 y.o. Female  MRN: [id number]  Assessment and Plan:   Anisia was seen today for transfer of care.  PTSD (post-traumatic stress disorder) Assessment & Plan: We didn't go into details but she confessed she has h/o Will see if we can get emdr  Orders: -     Ambulatory referral to Psychology  Birth control counseling -     Norethin Ace-Eth Estrad-FE; Take 1 tablet by mouth daily.  Dispense: 84 tablet; Refill: 4  Alcohol use with alcohol-induced mood disorder (HCC) Overview: ? H/o bipolar used to be on lithium but on abilify now and no diagnosis being given for mood disorder but she endorses hallucinations auditory and visual.  She is on aripiprazole from psych and advised  Assessment & Plan: Continue naltrexone Start campral  Orders: -     Acamprosate Calcium  Hypokalemia -     Comprehensive metabolic panel  Spasm -     Methocarbamol; Take 1 tablet (500 mg total) by mouth 4 (four) times daily.  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      Alcohol abuse Overview: H/o alcohol dependence and h/o extended rehab  x 6 weeks.  H/o seizures stopping alcohol.   No PTSD.   Taking naltrexone for years.   Has h/o trying gabapentin and drinking on top.  Has done many 12 step but hasnt spoke to sponsor in at least 1 month.   Had a couple drinks yesterday but accurate estimation of recent alcohol  use not offered and I didn't pry- having her grandmother present seemed to affect her willingness to discuss certain topics, but she did seem highly motivated to avoid relapsing to heavy EtOH use, and she strongly denied illicit use.  Assessment & Plan: We discussed plans to reduce at length campral offered  Continue naltrexone Discussed/deferred gabapentin I didn't discuss today but am considering antabuse if her recovery motivation remains high.         Health Maintenance  Topic Date Due   PAP-Cervical Cytology Screening  03/03/2021   PAP SMEAR-Modifier  03/03/2021   COVID-19 Vaccine (1) 03/23/2022 (Originally 05/27/1997)   HPV VACCINES (1 - 2-dose series) 03/08/2023 (Originally 11/25/2007)   TETANUS/TDAP  03/01/2031   Hepatitis C Screening  Completed   HIV Screening  Completed   INFLUENZA VACCINE  Discontinued     Recommended follow up:  Return in about 1 week (around 03/26/2022) for review response to recent changes close up follow. .   Subjective:  Patient presents today to establish care.  Prior patient of Dr. Orland Mustard.  Chief Complaint  Patient presents with   Transfer of Care    From Dr.Morrow. Concerns are muscle spasms, tremors, lower left severe back pain with a lump (unable to lay on left side). Needs BC pills and medication for tremors/muscle spasms.    Initial appointment concerns are muscle spasms, tremors, lower left severe back pain with a lump (unable to lay on left side)- robaxin/methocarbamol has  been helpful in the past.. Needs BC pills and medication for tremors/muscle spasms.  However, the appointment became much more focused on her psych history and history of alcohol use disorder which she had recent relapse after a long period of sobriety.  Her grandmother has driven a long ways to be here with her today.  H/o alcohol dependence and H/o extended rehab  x 6 weeks.  H/o seizures stopping alcohol.   No PTSD.   Taking naltrexone for years.   Has H/o trying  gabapentin and drinking on top.  Has done many 12 step but hasn't spoke to sponsor in at least 1 month.   Had a couple drinks yesterday but accurate estimation of recent alcohol use not offered and I didn't pry- having her grandmother present seemed to affect her willingness to discuss certain topics, but she did seem highly motivated to avoid relapsing to heavy EtOH use, and she strongly denied illicit use.  H/o multiple seizures in 2020 trying to dc alcohol per grandmother, patient cant remember ? H/o bipolar used to be on lithium but on Abilify now and no diagnosis being given for mood disorder but she endorses hallucinations auditory and visual.  She is on aripiprazole from psych and advised    For history taking, I took a per problem history from the patient and chart review as follows: Problem  Ptsd (Post-Traumatic Stress Disorder)  Alcohol Intoxication With Moderate Or Severe Use Disorder (Hcc)   H/o multiple seizures in 2020 Has done I/p rehab H/o failing gabapentin and naltrexone  H/o multiple seizures in 2020 trying to dc alcohol per grandmother, patient cant remember      Alcohol Use With Alcohol-Induced Mood Disorder (Hcc)   ? H/o bipolar used to be on lithium but on abilify now and no diagnosis being given for mood disorder but she endorses hallucinations auditory and visual.  She is on aripiprazole from psych and advised   Alcohol Abuse   H/o alcohol dependence and h/o extended rehab  x 6 weeks.  H/o seizures stopping alcohol.   No PTSD.   Taking naltrexone for years.   Has h/o trying gabapentin and drinking on top.  Has done many 12 step but hasnt spoke to sponsor in at least 1 month.   Had a couple drinks yesterday but accurate estimation of recent alcohol use not offered and I didn't pry- having her grandmother present seemed to affect her willingness to discuss certain topics, but she did seem highly motivated to avoid relapsing to heavy EtOH use, and she strongly denied  illicit use.   Alcohol Use (Resolved)     Depression Screen    03/19/2022    2:40 PM 09/29/2021    1:24 PM 02/13/2021   12:56 PM 01/24/2021   12:17 PM  PHQ 2/9 Scores  PHQ - 2 Score 1 0 6 3  PHQ- 9 Score  0 19 14   No results found for any visits on 03/19/22.   The following were reviewed and entered/updated in epic: Past Medical History:  Diagnosis Date   Anxiety    Asthma    Depression    PTSD (post-traumatic stress disorder) 03/19/2022   Pyelonephritis    Seizure-like activity ([place])    Seizures ([place])    Sepsis ([place])    Past Surgical History:  Procedure Laterality Date   dental procedure     Past Surgical History:  Procedure Laterality Date   dental procedure     Family Status  Relation Name  Status   Mother  Alive   Father  Alive   Sister Lauren Alive   MGM  (Not Specified)   MGF  (Not Specified)   PGM  (Not Specified)   PGF  (Not Specified)   Family History  Problem Relation Age of Onset   Depression Mother    29 / [place] Mother    Thyroid disease Mother    Hypertension Father    Alcohol abuse Father    Learning disabilities Sister    Hyperlipidemia Maternal Grandmother    Alcohol abuse Maternal Grandfather    Alcohol abuse Paternal Grandmother    Hypertension Paternal Grandfather    Outpatient Medications Prior to Visit  Medication Sig Dispense Refill   AUVELITY 45-105 MG TBCR TAKE 1 TABLET BY MOUTH TWICE A DAY 60 tablet 3   busPIRone (BUSPAR) 15 MG tablet Take 1 tablet (15 mg total) by mouth 3 (three) times daily. 90 tablet 3   diclofenac Sodium (VOLTAREN) 1 % GEL Apply 2 g topically 4 (four) times daily. 100 g 1   DULoxetine (CYMBALTA) 30 MG capsule Take 1 capsule (30 mg total) by mouth 2 (two) times daily. 60 capsule 3   naltrexone (DEPADE) 50 MG tablet Take 1 tablet (50 mg total) by mouth daily. 90 tablet 3   ondansetron (ZOFRAN-ODT) 4 MG disintegrating tablet Take 1 tablet (4 mg total) by mouth every 8 (eight) hours as needed for  nausea or vomiting. 20 tablet 0   potassium chloride (KLOR-CON) 10 MEQ tablet Take 1 tablet (10 mEq total) by mouth as directed. START with 2 tabs twice a day today only, then take 2 tabs for next 3 days, then 1 tab daily. 30 tablet 1   propranolol (INDERAL) 20 MG tablet Take 1 tablet (20 mg total) by mouth 3 (three) times daily. 90 tablet 3   QUEtiapine (SEROQUEL) 50 MG tablet Take 1 tablet (50 mg total) by mouth at bedtime. 30 tablet 3   rizatriptan (MAXALT) 10 MG tablet TAKE ONE TABLET BY MOUTH AS NEEDED *may repeat in 2 hours if needed* 90 tablet 3   traZODone (DESYREL) 100 MG tablet TAKE ONE TABLET BY MOUTH DAILY AT BEDTIME 30 tablet 0   triamcinolone cream (KENALOG) 0.1 % Apply to affected area 1-2 times daily 30 g 0   norethindrone-ethinyl estradiol-FE (HAILEY FE 1/20) 1-20 MG-MCG tablet Take 1 tablet by mouth daily. 84 tablet 4   No facility-administered medications prior to visit.    Allergies  Allergen Reactions   Lamotrigine Hives   Social History   Tobacco Use   Smoking status: Former    Packs/day: 0.25    Types: Cigarettes    Quit date: 05/10/2018    Years since quitting: 3.8   Smokeless tobacco: Never  Vaping Use   Vaping Use: Never used  Substance Use Topics   Alcohol use: Yes    Comment: 02/17/2021 7-9 2-3 oz pours of liquor per day   Drug use: Not Currently    Types: Marijuana    Comment: 10/03/20: No longer using. Last use was 4 months ago.    Immunization History  Administered Date(s) Administered   DTaP 01/19/1997, 04/01/1997, 05/31/1997, 01/13/2002   Hepatitis B 07/01/97, 01/19/1997, 04/01/1997, 05/31/1997   HiB (PRP-OMP) 01/19/1997, 04/01/1997   IPV 01/19/1997, 04/01/1997, 01/13/2002   MMR 01/13/2002, 03/08/2008   Meningococcal Conjugate 03/08/2008   Td 03/08/2008   Tdap 03/08/2008, 05/04/2020, 02/28/2021   Varicella 03/08/2008      Objective:  BP 120/88 (BP  Location: Left Arm, Patient Position: Sitting)   Pulse 79   Temp 98.2 F (36.8 C)  (Temporal)   Resp 14   Ht $R'5\' 6"'hC$  (1.676 m)   Wt 183 lb (83 kg)   LMP 02/21/2022 (Approximate)   SpO2 95%   BMI 29.54 kg/m  Body mass index is 29.54 kg/m.  She  is a very cordial and polite person who was a pleasure to meet.  Presents with her grandmother who frequently reports things that patient does not report.  Not clear if patient is forgetful or just not ready to disclose everything regarding her past.  She is quiet and kind.  She is tearful after asking about PTSD, but doesn't initially disclose having it.   Gen: NAD, resting comfortably,  HEENT: Mucous membranes are moist. Sclera conjunctiva and lids grossly normal Neck: no thyromegaly, no cervical lymphadenopathy CV: RRR no murmurs rubs or gallops Lungs: CTAB no crackles, wheeze, rhonchi Abdomen: soft/nontender/nondistended. No rebound or guarding.  Ext: no edema Skin: warm, dry Neuro: grossly intact  No images are attached to the encounter or orders placed in the encounter.  Results for orders placed or performed in visit on 03/08/22  CBC with Differential  Result Value Ref Range   WBC 6.5 3.8 - 10.8 Thousand/uL   RBC 3.44 (L) 3.80 - 5.10 Million/uL   Hemoglobin 12.0 11.7 - 15.5 g/dL   HCT 33.8 (L) 35.0 - 45.0 %   MCV 98.3 80.0 - 100.0 fL   MCH 34.9 (H) 27.0 - 33.0 pg   MCHC 35.5 32.0 - 36.0 g/dL   RDW 22.9 (H) 11.0 - 15.0 %   Platelets 323 140 - 400 Thousand/uL   MPV 10.0 7.5 - 12.5 fL   Neutro Abs 4,069 1,500 - 7,800 cells/uL   Lymphs Abs 2,035 850 - 3,900 cells/uL   Absolute Monocytes 332 200 - 950 cells/uL   Eosinophils Absolute 33 15 - 500 cells/uL   Basophils Absolute 33 0 - 200 cells/uL   Neutrophils Relative % 62.6 %   Total Lymphocyte 31.3 %   Monocytes Relative 5.1 %   Eosinophils Relative 0.5 %   Basophils Relative 0.5 %

## 2022-03-19 NOTE — Assessment & Plan Note (Signed)
We discussed plans to reduce at length campral offered  Continue naltrexone Discussed/deferred gabapentin I didn't discuss today but am considering antabuse if her recovery motivation remains high.

## 2022-03-19 NOTE — Assessment & Plan Note (Signed)
Continue naltrexone Start campral

## 2022-03-19 NOTE — Assessment & Plan Note (Signed)
Encouraged continue naltrexone Encouraged resume 12 step programs Has a sponsor,- encouraged to call Will try campral/acamprosate.

## 2022-03-19 NOTE — Telephone Encounter (Signed)
Patient requests RX for Lithium be sent to:  [facility] [address] [place] - [address] Phone:  [phone]  Fax:  [phone]

## 2022-03-19 NOTE — Patient Instructions (Signed)
It was a pleasure seeing you today!  Today the plan is...  There is no need to make any changes to your medications - 1 months.  PTSD (post-traumatic stress disorder)      [name], MD   No follow-ups on file.    - Please bring all your medicines to your next appointment. This is the best way for me to know exactly what you're taking.  - If your condition begins to worsen or become severe:  go to the ER. - If your condition fails to resolve or you have other questions / concerns: please contact me via phone [phone] or MyChart messaging.     IF you received an x-ray today, you will receive an invoice from [facility]. Please contact [facility] at [phone] with questions or concerns regarding your invoice.    IF you received labwork today, you will receive an invoice from [facility]. Please contact [facility] at [phone] with questions or concerns regarding your invoice.    Our billing staff will not be able to assist you with questions regarding bills from these companies.   You will be contacted with the lab results as soon as they are available. The fastest way to get your results is to activate your My Chart account. Instructions are located on the last page of this paperwork. If you have not heard from [place] regarding the results in 2 weeks, please contact this office. For any labs or imaging tests, we will call you if the results are significantly abnormal.  Most normal results will be posted to myChart as soon as they are available and I will comment on them there within 2-3 business days.

## 2022-03-20 ENCOUNTER — Other Ambulatory Visit: Payer: Self-pay

## 2022-03-20 ENCOUNTER — Telehealth: Payer: Self-pay | Admitting: Internal Medicine

## 2022-03-20 ENCOUNTER — Encounter: Payer: Self-pay | Admitting: Internal Medicine

## 2022-03-20 LAB — COMPREHENSIVE METABOLIC PANEL
ALT: 14 U/L (ref 0–35)
AST: 27 U/L (ref 0–37)
Albumin: 4.7 g/dL (ref 3.5–5.2)
Alkaline Phosphatase: 60 U/L (ref 39–117)
BUN: 7 mg/dL (ref 6–23)
CO2: 22 mEq/L (ref 19–32)
Calcium: 9.9 mg/dL (ref 8.4–10.5)
Chloride: 102 mEq/L (ref 96–112)
Creatinine, Ser: 0.83 mg/dL (ref 0.40–1.20)
GFR: 98.05 mL/min (ref >60.00)
Glucose, Bld: 81 mg/dL (ref 70–99)
Potassium: 4.6 mEq/L (ref 3.5–5.1)
Sodium: 139 mEq/L (ref 135–145)
Total Bilirubin: 0.5 mg/dL (ref 0.2–1.2)
Total Protein: 8.2 g/dL (ref 6.0–8.3)

## 2022-03-20 MED ORDER — ACAMPROSATE CALCIUM 333 MG PO TBEC: 666.0000 mg | DELAYED_RELEASE_TABLET | Freq: Three times a day (TID) | ORAL | 5 refills | Status: DC

## 2022-03-20 NOTE — Telephone Encounter (Signed)
Spoke with patient concerning this and sent correct prescription for Campral to pharmacy on file.

## 2022-03-20 NOTE — Telephone Encounter (Signed)
Spoke with patient concerning this and sent correct prescription for Campral to pharmacy on file. 

## 2022-03-20 NOTE — Telephone Encounter (Signed)
Patient requests to be called at ph# [phone] regarding medications that were supposed to be sent to [facility] (Patient states there was supposed to be five RX's but [facility] only received 2 RX's).  Patient states one of the medications not received by Pharmacy starts with a C and Lithium was discussed at office visit.

## 2022-03-25 ENCOUNTER — Encounter: Payer: Self-pay | Admitting: Internal Medicine

## 2022-03-27 ENCOUNTER — Ambulatory Visit: Payer: 59 | Admitting: Internal Medicine

## 2022-03-27 ENCOUNTER — Encounter: Payer: Self-pay | Admitting: Internal Medicine

## 2022-03-27 DIAGNOSIS — F1094 Alcohol use, unspecified with alcohol-induced mood disorder: Secondary | ICD-10-CM

## 2022-03-27 DIAGNOSIS — E876 Hypokalemia: Secondary | ICD-10-CM | POA: Diagnosis not present

## 2022-03-27 NOTE — Assessment & Plan Note (Signed)
Resolved Suspect it was alcohol related Advise we dont do supplement for now but will recheck according to her etoh use.

## 2022-03-27 NOTE — Progress Notes (Signed)
[phone]  Routine Medical Office Visit  Patient:  Natasha Pope [place]      Age: 26 y.o.       Sex:  female  Date:   03/27/2022  PCP:    Loralee Pacas, [place] Provider: Loralee Pacas, [place]   Assessment/Plan:   Kesley was seen today for follow-up.  Alcohol use with alcohol-induced mood disorder ([place]) Overview: ? H/o bipolar used to be on lithium but on abilify now and no diagnosis being given for mood disorder but she endorses hallucinations auditory and visual.  These symptoms are controlled by aripiprazole/seroquel from psych.   Its unclear to me if the hallucinations are alcohol withdrawal related.  She denies any other substance use.   Assessment & Plan: Today we spent 30 minutes discussing her desire to discontinue alcohol use and I used motivational interviewing and cognitive behavioral therapy techniques to help her to solidify her motivation.  She reported 9 of 9-1/2 out of 10 level of motivation to completely stop drinking although she has not been able to maintain sobriety yet.  She identified boredom as a significant trigger for her causing her to go to restaurants to drink still.  I encouraged her to increase her acamprosate usage whenever she is bored as that might help with those cravings and reexplained how naltrexone should reduce any pleasure she gets from using alcohol for boredom and she stated that she has been taking the naltrexone and that she did not get much pleasure from the alcohol usage.  We discussed the mechanism of dopamine and how it contributes to substance use disorders and I emphasized the importance of focusing on the negative aspects of alcohol use in order to maintain about high motivation level to discontinue.  I also strongly encouraged that she speak with her sponsor whom she has been over a month now since the last speaking.  Also encouraged her to at least once this week if she is bored and thinking of drinking to attend AA  12-step meetings online instead.  She asked about restarting Librium and I explained that it is used primarily for detox and my impression is that she does not need detox at this time based on her current flow levels of reported alcohol use although her grandmother suggested it might be more.  She reports having difficulty remembering if she is drinking or not and complains of hallucinations if she does not take her Abilify and Seroquel.  I encouraged her to discuss with her psychiatrist about possibly combining these 2 medicines since they act similarly to try to reduce her overall pill burden which has become more substantial now that I have added several medications for alcohol use disorder.  I also encouraged her to not use the trazodone if she is drinking since it would be like mixing sedatives and instead use it only to help her sleep when she is not drinking.  I also encouraged her to not take the propranolol when she is drinking as she would not need it to control tremors it is more for withdrawal tremors.  Her heart rate was elevated today and I suspect it is due to dehydration from last night drinking so I encouraged her to drink more fluids and not just today but whenever she drinks alcohol and I think it is also related to her previous low potassium level which resolved.  Overall I encouraged her that she is doing well to seek medical support even though we have  not discontinued alcohol yet just continuing to get support will ultimately achieve the long-term goal that she has for sobriety.  Another thing that has been standing in the way of it is her PTSD which I have not asked her to relieve her trauma and I am not sure what it is due to: I tried to find the EMDR provider and that is pending still and I strongly encouraged her to participate as soon as possible even if she does not have her alcohol use under control.  We also discussed Antabuse and how it might be useful if her motivation gets higher  and she manages to stay sober for a little while as it could help [place] to reinforce strongly an alcohol free lifestyle   Hypokalemia Assessment & Plan: Resolved Suspect it was alcohol related Advise we dont do supplement for now but will recheck according to her etoh use.     She was advised to follow up as frequently as she is willing/motivated to do so up to weekly.  Her alcohol use disorder is at high risk of serious heavy return to use requiring another detox if she doesn't do so.   Subjective:   Natasha Pope is a 26 y.o. female with PMH significant for: Past Medical History:  Diagnosis Date   Anxiety    Asthma    Depression    PTSD (post-traumatic stress disorder) 03/19/2022   Pyelonephritis    Seizure-like activity ([place])    Seizures ([place])    Sepsis ([place])      She main concern for today's visit is: Chief Complaint  Patient presents with   Follow-up    1 week for alcohol use w/ alcohol-induced mood disorder, alcohol abuse, spasm, hypokalemia, BC consultation and PTSD. Everything is the same. Has gotten on BC pill.           Objective:  Physical Exam: BP 128/89 (BP Location: Right Arm, Patient Position: Sitting)   Pulse (!) 116   Temp (!) 96.4 F (35.8 C) (Temporal)   Resp 14   Ht _0  (1.676 m)   Wt 183 lb 12.8 oz (83.4 kg)   LMP 02/21/2022 (Approximate)   SpO2 93%   BMI 29.67 kg/m   She  is a polite, friendly, and genuine person, she is kind of quiet and shy.  She seems to forget how much she is drinking. Constitutional: NAD, AAO, not ill-appearing  Neuro: alert, no focal deficit obvious, articulate speech Psych: normal mood, behavior, thought content   Results:  No results found for any visits on 03/27/22.   Recent Results (from the past 2160 hour(s))  Comp Met (CMET)     Status: Abnormal   Collection Time: 03/07/22  4:27 PM  Result Value Ref Range   Sodium 140 135 - 145 mEq/L   Potassium 2.9 (L) 3.5 - 5.1 mEq/L   Chloride 103 96 - 112  mEq/L   CO2 24 19 - 32 mEq/L   Glucose, Bld 98 70 - 99 mg/dL   BUN 4 (L) 6 - 23 mg/dL   Creatinine, Ser 0.75 0.40 - 1.20 mg/dL   Total Bilirubin 0.3 0.2 - 1.2 mg/dL   Alkaline Phosphatase 53 39 - 117 U/L   AST 25 0 - 37 U/L   ALT 15 0 - 35 U/L   Total Protein 7.6 6.0 - 8.3 g/dL   Albumin 4.6 3.5 - 5.2 g/dL   GFR 110.76 >60.00 mL/min    Comment: Calculated using the CKD-EPI  Creatinine Equation (2021)   Calcium 8.7 8.4 - 10.5 mg/dL  CBC with Differential/Platelet     Status: Abnormal   Collection Time: 03/07/22  4:27 PM  Result Value Ref Range   WBC 6.4 4.0 - 10.5 K/uL   RBC 3.42 (L) 3.87 - 5.11 Mil/uL   Hemoglobin 11.9 (L) 12.0 - 15.0 g/dL   HCT 35.5 (L) 36.0 - 46.0 %   MCV 103.9 (H) 78.0 - 100.0 fl   MCHC 33.4 30.0 - 36.0 g/dL   RDW 26.1 (H) 11.5 - 15.5 %   Platelets 308.0 150.0 - 400.0 K/uL   Neutrophils Relative % 61.6 43.0 - 77.0 %   Lymphocytes Relative 30.6 12.0 - 46.0 %   Monocytes Relative 5.4 3.0 - 12.0 %   Eosinophils Relative 0.6 0.0 - 5.0 %   Basophils Relative 1.8 0.0 - 3.0 %   Neutro Abs 3.9 1.4 - 7.7 K/uL   Lymphs Abs 1.9 0.7 - 4.0 K/uL   Monocytes Absolute 0.3 0.1 - 1.0 K/uL   Eosinophils Absolute 0.0 0.0 - 0.7 K/uL   Basophils Absolute 0.1 0.0 - 0.1 K/uL  Lyme Disease Serology w/Reflex     Status: None   Collection Time: 03/07/22  4:27 PM  Result Value Ref Range   Lyme Total Antibody EIA Negative Negative    Comment: Lyme antibodies not detected. Reflex testing is not indicated. No laboratory evidence of infection with B. burgdorferi (Lyme disease). Negative results may occur in patients recently infected (less than or equal to 14 days) with B. burgdorferi.  If recent infection is suspected, repeat testing on a new sample collected in 7 to 14 days is recommended.   CBC with Differential     Status: Abnormal   Collection Time: 03/08/22 12:07 PM  Result Value Ref Range   WBC 6.5 3.8 - 10.8 Thousand/uL   RBC 3.44 (L) 3.80 - 5.10 Million/uL   Hemoglobin  12.0 11.7 - 15.5 g/dL   HCT 33.8 (L) 35.0 - 45.0 %   MCV 98.3 80.0 - 100.0 fL   MCH 34.9 (H) 27.0 - 33.0 pg   MCHC 35.5 32.0 - 36.0 g/dL   RDW 22.9 (H) 11.0 - 15.0 %   Platelets 323 140 - 400 Thousand/uL   MPV 10.0 7.5 - 12.5 fL   Neutro Abs 4,069 1,500 - 7,800 cells/uL   Lymphs Abs 2,035 850 - 3,900 cells/uL   Absolute Monocytes 332 200 - 950 cells/uL   Eosinophils Absolute 33 15 - 500 cells/uL   Basophils Absolute 33 0 - 200 cells/uL   Neutrophils Relative % 62.6 %   Total Lymphocyte 31.3 %   Monocytes Relative 5.1 %   Eosinophils Relative 0.5 %   Basophils Relative 0.5 %  Comp Met (CMET)     Status: None   Collection Time: 03/19/22  3:32 PM  Result Value Ref Range   Sodium 139 135 - 145 mEq/L   Potassium 4.6 3.5 - 5.1 mEq/L   Chloride 102 96 - 112 mEq/L   CO2 22 19 - 32 mEq/L   Glucose, Bld 81 70 - 99 mg/dL   BUN 7 6 - 23 mg/dL   Creatinine, Ser 0.83 0.40 - 1.20 mg/dL   Total Bilirubin 0.5 0.2 - 1.2 mg/dL   Alkaline Phosphatase 60 39 - 117 U/L   AST 27 0 - 37 U/L   ALT 14 0 - 35 U/L   Total Protein 8.2 6.0 - 8.3 g/dL   Albumin 4.7 3.5 -  5.2 g/dL   GFR 98.05 >60.00 mL/min    Comment: Calculated using the CKD-EPI Creatinine Equation (2021)   Calcium 9.9 8.4 - 10.5 mg/dL

## 2022-03-27 NOTE — Assessment & Plan Note (Addendum)
Today we spent 30 minutes discussing her desire to discontinue alcohol use and I used motivational interviewing and cognitive behavioral therapy techniques to help her to solidify her motivation.  She reported 9 of 9-1/2 out of 10 level of motivation to completely stop drinking although she has not been able to maintain sobriety yet.  She identified boredom as a significant trigger for her causing her to go to restaurants to drink still.  I encouraged her to increase her acamprosate usage whenever she is bored as that might help with those cravings and reexplained how naltrexone should reduce any pleasure she gets from using alcohol for boredom and she stated that she has been taking the naltrexone and that she did not get much pleasure from the alcohol usage.  We discussed the mechanism of dopamine and how it contributes to substance use disorders and I emphasized the importance of focusing on the negative aspects of alcohol use in order to maintain about high motivation level to discontinue.  I also strongly encouraged that she speak with her sponsor whom she has been over a month now since the last speaking.  Also encouraged her to at least once this week if she is bored and thinking of drinking to attend AA 12-step meetings online instead.  She asked about restarting Librium and I explained that it is used primarily for detox and my impression is that she does not need detox at this time based on her current flow levels of reported alcohol use although her grandmother suggested it might be more.  She reports having difficulty remembering if she is drinking or not and complains of hallucinations if she does not take her Abilify and Seroquel.  I encouraged her to discuss with her psychiatrist about possibly combining these 2 medicines since they act similarly to try to reduce her overall pill burden which has become more substantial now that I have added several medications for alcohol use disorder.  I also  encouraged her to not use the trazodone if she is drinking since it would be like mixing sedatives and instead use it only to help her sleep when she is not drinking.  I also encouraged her to not take the propranolol when she is drinking as she would not need it to control tremors it is more for withdrawal tremors.  Her heart rate was elevated today and I suspect it is due to dehydration from last night drinking so I encouraged her to drink more fluids and not just today but whenever she drinks alcohol and I think it is also related to her previous low potassium level which resolved.  Overall I encouraged her that she is doing well to seek medical support even though we have not discontinued alcohol yet just continuing to get support will ultimately achieve the long-term goal that she has for sobriety.  Another thing that has been standing in the way of it is her PTSD which I have not asked her to relieve her trauma and I am not sure what it is due to: I tried to find the EMDR provider and that is pending still and I strongly encouraged her to participate as soon as possible even if she does not have her alcohol use under control.  We also discussed Antabuse and how it might be useful if her motivation gets higher and she manages to stay sober for a little while as it could help [place] to reinforce strongly an alcohol free lifestyle

## 2022-03-28 NOTE — Telephone Encounter (Signed)
We spoke at visit- this is a medicine used in alcohol detox facilities but I don't use as an outpatient.   It prevents seizures from withdrawal but it doesn't help with  discontinuing alcohol .  We spoke at appointment and we both felt that returning to detox isnt right at this time, but I'm here to assist with that if the problem gets out of control again (heavy use with withdrawal symptoms should go back to detox)

## 2022-04-03 ENCOUNTER — Encounter: Payer: Self-pay | Admitting: Internal Medicine

## 2022-04-03 ENCOUNTER — Ambulatory Visit (INDEPENDENT_AMBULATORY_CARE_PROVIDER_SITE_OTHER): Payer: 59 | Admitting: Internal Medicine

## 2022-04-03 VITALS — BP 135/97 | HR 85 | Temp 97.6°F | Resp 14 | Ht 66.0 in | Wt 181.6 lb

## 2022-04-03 DIAGNOSIS — F10930 Alcohol use, unspecified with withdrawal, uncomplicated: Secondary | ICD-10-CM | POA: Diagnosis not present

## 2022-04-03 DIAGNOSIS — R413 Other amnesia: Secondary | ICD-10-CM | POA: Insufficient documentation

## 2022-04-03 HISTORY — DX: Other amnesia: R41.3

## 2022-04-03 NOTE — Patient Instructions (Signed)
It was a pleasure seeing you today!  Today the plan is...  Add a multivitamin to see if it helps memory, and tell your psychiatrist about the memory issues and the panic attacks.  Keep going to 12 step meetings with sponsor.  Alcohol withdrawal syndrome without complication [facility]) Assessment & Plan: She is having panic attacks which I think are part of her outstanding progress in recovery and sobriety this week in other words I think she has alcohol withdrawal some this week.  Also the panic attacks are connected to poor memory which I think is also associated with vitamin deficiencies from history of alcohol use and I think should slowly improve if she can continue to abstain from alcohol and take the multivitamin that I recommended today.  I would also add additional medications for mood control but she is being managed by psychiatry so instead I am just going to share this note with him with her permission   Memory loss Assessment & Plan: f A/w alcohol use in past. Suspect due to vitamin deficiency Recommended she take daily mvi and continue to abstain from EtOH      Natasha Olszewski, MD   Return if symptoms worsen or fail to improve, for I encourage close follow up as frequently as weekly to help maintain sobriety and recovery..    - Please bring all your medicines to your next appointment. This is the best way for me to know exactly what you're taking.  - If your condition begins to worsen or become severe:  go to the ER. - If your condition fails to resolve or you have other questions / concerns: please contact me via phone [phone] or MyChart messaging.

## 2022-04-03 NOTE — Progress Notes (Signed)
[place] [facility] [employer]:  [place] Provider: Loralee Pacas, MD   Routine Medical Office Visit  Patient:  Natasha Pope      Age: 26 y.o.       Sex:  female  Date:   04/03/2022  PCP:    Loralee Pacas, MD   Assessment/Plan:   Overall she has made dramatic improvement in her recovery and is doing well now although she still having some memory issues and panic attacks that I want to inform her psychiatrist about further assistance (sharing note with patient permisison).  Also add a multivitamin to see if it helps memory, Keep going to 12 step meetings with sponsor. Keep taking the current meds for now to help maintain sobriety  [name] was seen today for follow-up.  Alcohol withdrawal syndrome without complication [facility]) Assessment & Plan: She is having panic attacks which I think are part of her outstanding progress in recovery and sobriety this week in other words I think she has alcohol withdrawal some this week.  Also the panic attacks are connected to poor memory which I think is also associated with vitamin deficiencies from history of alcohol use and I think should slowly improve if she can continue to abstain from alcohol and take the multivitamin that I recommended today.  I would also add additional medications for mood control but she is being managed by psychiatry so instead I am just going to share this note with him with her permission   Memory loss Overview: Mild chronic issue. Things like forgetting where she put glasses A/w alcohol use in past. Suspect due to vitamin deficiency Recommended she take daily mvi and continue to abstain from EtOH  Assessment & Plan: f A/w alcohol use in past. Suspect due to vitamin deficiency Recommended she take daily mvi and continue to abstain from EtOH     Return if symptoms worsen or fail to improve, for I encourage close follow up as frequently as weekly to help maintain sobriety and  recovery.Marland Kitchen   She was advised to call the office or go to ER if her condition worsens    Subjective:   Natasha Pope is a 26 y.o. female with PMH significant for: Past Medical History:  Diagnosis Date   Anxiety    Asthma    Depression    Memory loss 04/03/2022   A/w alcohol use in past. Suspect due to vitamin deficiency Recommended she take daily mvi and continue to abstain from EtOH   PTSD (post-traumatic stress disorder) 03/19/2022   Pyelonephritis    Seizure-like activity ([place])    Seizures ([place])    Sepsis ([place])      She main concern for today's visit is: Chief Complaint  Patient presents with   Follow-up    1 week    Specifically, she follows up for support and encouragement in her recovery from alcohol use disorder.  She is proud that she is going to meetings with her sponsor now.   Additional physician collected history: saw sponsor and went to 4 meetings this week reports she is taking the meds consistently- only possible s/e sleepiness, maybe memory/attention issues. still taking naltrexone, taking acamprosate which is making a little sleepy, also taking methocarbamol buspirone. anxiety levels still really bad despite all this. panic attack a/w wanting to call her mom.   Endorses she may be having some memory issues lost her glasses. Maybe more of attention issues.  last saw psychiatry a month ago.  Reports she is taking all of: Outpatient Encounter Medications as of 04/03/2022  Medication Sig   acamprosate (CAMPRAL) 333 MG tablet Take 2 tablets (666 mg total) by mouth 3 (three) times daily with meals.   AUVELITY 45-105 MG TBCR TAKE 1 TABLET BY MOUTH TWICE A DAY   busPIRone (BUSPAR) 15 MG tablet Take 1 tablet (15 mg total) by mouth 3 (three) times daily.   diclofenac Sodium (VOLTAREN) 1 % GEL Apply 2 g topically 4 (four) times daily.   DULoxetine (CYMBALTA) 30 MG capsule Take 1 capsule (30 mg total) by mouth 2 (two) times daily.   methocarbamol (ROBAXIN) 500  MG tablet Take 1 tablet (500 mg total) by mouth 4 (four) times daily.   naltrexone (DEPADE) 50 MG tablet Take 1 tablet (50 mg total) by mouth daily.   norethindrone-ethinyl estradiol-FE (HAILEY FE 1/20) 1-20 MG-MCG tablet Take 1 tablet by mouth daily.   ondansetron (ZOFRAN-ODT) 4 MG disintegrating tablet Take 1 tablet (4 mg total) by mouth every 8 (eight) hours as needed for nausea or vomiting.   propranolol (INDERAL) 20 MG tablet Take 1 tablet (20 mg total) by mouth 3 (three) times daily.   QUEtiapine (SEROQUEL) 50 MG tablet Take 1 tablet (50 mg total) by mouth at bedtime.   rizatriptan (MAXALT) 10 MG tablet TAKE ONE TABLET BY MOUTH AS NEEDED *may repeat in 2 hours if needed*   traZODone (DESYREL) 100 MG tablet TAKE ONE TABLET BY MOUTH DAILY AT BEDTIME   triamcinolone cream (KENALOG) 0.1 % Apply to affected area 1-2 times daily   potassium chloride (KLOR-CON) 10 MEQ tablet Take 1 tablet (10 mEq total) by mouth as directed. START with 2 tabs twice a day today only, then take 2 tabs for next 3 days, then 1 tab daily. (Patient not taking: Reported on 04/03/2022)   No facility-administered encounter medications on file as of 04/03/2022.          Objective:  Physical Exam: BP (!) 135/97 (BP Location: Right Arm, Patient Position: Sitting)   Pulse 85   Temp 97.6 F (36.4 C) (Temporal)   Resp 14   Ht 5' 6" (1.676 m)   Wt 181 lb 9.6 oz (82.4 kg)   LMP 02/21/2022 (Approximate)   SpO2 98%   BMI 29.31 kg/m   She  is a polite, friendly, and genuine person Constitutional: NAD, AAO, not ill-appearing. Neuro: alert, no focal deficit obvious, articulate speech Psych: normal mood, behavior, thought content   Problem specific physical exam findings:  Looks well this week   Results:  No results found for any visits on 04/03/22.   Recent Results (from the past 2160 hour(s))  Comp Met (CMET)     Status: Abnormal   Collection Time: 03/07/22  4:27 PM  Result Value Ref Range   Sodium 140 135 -  145 mEq/L   Potassium 2.9 (L) 3.5 - 5.1 mEq/L   Chloride 103 96 - 112 mEq/L   CO2 24 19 - 32 mEq/L   Glucose, Bld 98 70 - 99 mg/dL   BUN 4 (L) 6 - 23 mg/dL   Creatinine, Ser 0.75 0.40 - 1.20 mg/dL   Total Bilirubin 0.3 0.2 - 1.2 mg/dL   Alkaline Phosphatase 53 39 - 117 U/L   AST 25 0 - 37 U/L   ALT 15 0 - 35 U/L   Total Protein 7.6 6.0 - 8.3 g/dL   Albumin 4.6 3.5 - 5.2 g/dL   GFR 110.76 >60.00 mL/min  Comment: Calculated using the CKD-EPI Creatinine Equation (2021)   Calcium 8.7 8.4 - 10.5 mg/dL  CBC with Differential/Platelet     Status: Abnormal   Collection Time: 03/07/22  4:27 PM  Result Value Ref Range   WBC 6.4 4.0 - 10.5 K/uL   RBC 3.42 (L) 3.87 - 5.11 Mil/uL   Hemoglobin 11.9 (L) 12.0 - 15.0 g/dL   HCT 35.5 (L) 36.0 - 46.0 %   MCV 103.9 (H) 78.0 - 100.0 fl   MCHC 33.4 30.0 - 36.0 g/dL   RDW 26.1 (H) 11.5 - 15.5 %   Platelets 308.0 150.0 - 400.0 K/uL   Neutrophils Relative % 61.6 43.0 - 77.0 %   Lymphocytes Relative 30.6 12.0 - 46.0 %   Monocytes Relative 5.4 3.0 - 12.0 %   Eosinophils Relative 0.6 0.0 - 5.0 %   Basophils Relative 1.8 0.0 - 3.0 %   Neutro Abs 3.9 1.4 - 7.7 K/uL   Lymphs Abs 1.9 0.7 - 4.0 K/uL   Monocytes Absolute 0.3 0.1 - 1.0 K/uL   Eosinophils Absolute 0.0 0.0 - 0.7 K/uL   Basophils Absolute 0.1 0.0 - 0.1 K/uL  Lyme Disease Serology w/Reflex     Status: None   Collection Time: 03/07/22  4:27 PM  Result Value Ref Range   Lyme Total Antibody EIA Negative Negative    Comment: Lyme antibodies not detected. Reflex testing is not indicated. No laboratory evidence of infection with B. burgdorferi (Lyme disease). Negative results may occur in patients recently infected (less than or equal to 14 days) with B. burgdorferi.  If recent infection is suspected, repeat testing on a new sample collected in 7 to 14 days is recommended.   CBC with Differential     Status: Abnormal   Collection Time: 03/08/22 12:07 PM  Result Value Ref Range   WBC 6.5 3.8  - 10.8 Thousand/uL   RBC 3.44 (L) 3.80 - 5.10 Million/uL   Hemoglobin 12.0 11.7 - 15.5 g/dL   HCT 33.8 (L) 35.0 - 45.0 %   MCV 98.3 80.0 - 100.0 fL   MCH 34.9 (H) 27.0 - 33.0 pg   MCHC 35.5 32.0 - 36.0 g/dL   RDW 22.9 (H) 11.0 - 15.0 %   Platelets 323 140 - 400 Thousand/uL   MPV 10.0 7.5 - 12.5 fL   Neutro Abs 4,069 1,500 - 7,800 cells/uL   Lymphs Abs 2,035 850 - 3,900 cells/uL   Absolute Monocytes 332 200 - 950 cells/uL   Eosinophils Absolute 33 15 - 500 cells/uL   Basophils Absolute 33 0 - 200 cells/uL   Neutrophils Relative % 62.6 %   Total Lymphocyte 31.3 %   Monocytes Relative 5.1 %   Eosinophils Relative 0.5 %   Basophils Relative 0.5 %  Comp Met (CMET)     Status: None   Collection Time: 03/19/22  3:32 PM  Result Value Ref Range   Sodium 139 135 - 145 mEq/L   Potassium 4.6 3.5 - 5.1 mEq/L   Chloride 102 96 - 112 mEq/L   CO2 22 19 - 32 mEq/L   Glucose, Bld 81 70 - 99 mg/dL   BUN 7 6 - 23 mg/dL   Creatinine, Ser 0.83 0.40 - 1.20 mg/dL   Total Bilirubin 0.5 0.2 - 1.2 mg/dL   Alkaline Phosphatase 60 39 - 117 U/L   AST 27 0 - 37 U/L   ALT 14 0 - 35 U/L   Total Protein 8.2 6.0 - 8.3 g/dL  Albumin 4.7 3.5 - 5.2 g/dL   GFR 98.05 >60.00 mL/min    Comment: Calculated using the CKD-EPI Creatinine Equation (2021)   Calcium 9.9 8.4 - 10.5 mg/dL

## 2022-04-03 NOTE — Assessment & Plan Note (Signed)
She is having panic attacks which I think are part of her outstanding progress in recovery and sobriety this week in other words I think she has alcohol withdrawal some this week.  Also the panic attacks are connected to poor memory which I think is also associated with vitamin deficiencies from history of alcohol use and I think should slowly improve if she can continue to abstain from alcohol and take the multivitamin that I recommended today.  I would also add additional medications for mood control but she is being managed by psychiatry so instead I am just going to share this note with him with her permission

## 2022-04-03 NOTE — Assessment & Plan Note (Addendum)
Slight, chronic, A/w heavy  alcohol use in past. Suspect due to vitamin deficiency Recommended she take daily mvi and continue to abstain from EtOH

## 2022-04-17 ENCOUNTER — Encounter: Payer: Self-pay | Admitting: Internal Medicine

## 2022-04-17 ENCOUNTER — Ambulatory Visit: Payer: 59 | Admitting: Internal Medicine

## 2022-04-17 VITALS — BP 139/97 | HR 75 | Temp 97.1°F | Resp 14 | Ht 66.0 in | Wt 182.4 lb

## 2022-04-17 DIAGNOSIS — R0683 Snoring: Secondary | ICD-10-CM | POA: Insufficient documentation

## 2022-04-17 DIAGNOSIS — F319 Bipolar disorder, unspecified: Secondary | ICD-10-CM

## 2022-04-17 DIAGNOSIS — R112 Nausea with vomiting, unspecified: Secondary | ICD-10-CM

## 2022-04-17 DIAGNOSIS — E669 Obesity, unspecified: Secondary | ICD-10-CM | POA: Diagnosis not present

## 2022-04-17 HISTORY — DX: Snoring: R06.83

## 2022-04-17 HISTORY — DX: Obesity, unspecified: E66.9

## 2022-04-17 MED ORDER — SEMAGLUTIDE-WEIGHT MANAGEMENT 1.7 MG/0.75ML ~~LOC~~ SOAJ: 1.7000 mg | SUBCUTANEOUS | 0 refills | Status: DC

## 2022-04-17 MED ORDER — CARIPRAZINE HCL 3 MG PO CAPS: 3.0000 mg | ORAL_CAPSULE | Freq: Every day | ORAL | 3 refills | Status: DC

## 2022-04-17 MED ORDER — ONDANSETRON 4 MG PO TBDP: 4.0000 mg | ORAL_TABLET | Freq: Three times a day (TID) | ORAL | 0 refills | Status: DC | PRN

## 2022-04-17 MED ORDER — SEMAGLUTIDE-WEIGHT MANAGEMENT 0.5 MG/0.5ML ~~LOC~~ SOAJ: 0.5000 mg | SUBCUTANEOUS | 0 refills | Status: DC

## 2022-04-17 MED ORDER — SEMAGLUTIDE-WEIGHT MANAGEMENT 1 MG/0.5ML ~~LOC~~ SOAJ: 1.0000 mg | SUBCUTANEOUS | 0 refills | Status: DC

## 2022-04-17 MED ORDER — SEMAGLUTIDE-WEIGHT MANAGEMENT 2.4 MG/0.75ML ~~LOC~~ SOAJ: 2.4000 mg | SUBCUTANEOUS | 0 refills | Status: DC

## 2022-04-17 MED ORDER — SEMAGLUTIDE-WEIGHT MANAGEMENT 0.25 MG/0.5ML ~~LOC~~ SOAJ: 0.2500 mg | SUBCUTANEOUS | 0 refills | Status: DC

## 2022-04-17 NOTE — Assessment & Plan Note (Signed)
Her boyfriend reports that she has snoring so I showed her how to use snore lab and download it at the office visit so that she can determine if this is a more serious issue and then will check back and discuss further if a sleep study might be needed.  Her to use it whether she is drinking or not to get data on both would be really nice because it would probably help to show her how bad alcohol is for her sleep and that could be reinforcing for her plan to stop drinking however I do not encourage her to drink just to test this snore lab

## 2022-04-17 NOTE — Progress Notes (Signed)
[place] at [facility]:  [phone]   Routine Medical Office Visit  Patient:  Natasha Pope [place]      Age: 26 y.o.       Sex:  female  Date:   04/17/2022  PCP:    Loralee Pacas, [place] Provider: Loralee Pacas, MD  Assessment/Plan:   There are no diagnoses linked to this encounter.   No follow-ups on file.    She returns for a 2-week follow-up for intensive management of alcohol use disorder she has been doing really well.  However in the last couple weeks there was a few episode of drinking but her last alcohol use that she can recall is Was September 15.  There have been some good evolutions in the last couple weeks she has started taking acamprosate consistently when feeling bored and triggered to drink and it was very helpful for her and she even needs a refill now.  Also she has started to run low on the methocarbamol 500 that is prescribed as 1 tablet 4 times daily August 28 and it still has 3 refills left.  There are 5 refills left on the bottle so she asked he does not need any of these medications reordered today.  I encouraged her just to stop by the pharmacy and get them filled out ASAP because it is important is better to have them and not need them the need them and not have.  Reports she has not taken the gabapentin she does not have it.  We reviewed her entire med list for accuracy and she reported that it is accurate and that she is consistently using all of the medications as listed below: Current Outpatient Medications on File Prior to Visit  Medication Sig Dispense Refill   acamprosate (CAMPRAL) 333 MG tablet Take 2 tablets (666 mg total) by mouth 3 (three) times daily with meals. 90 tablet 5   AUVELITY 45-105 MG TBCR TAKE 1 TABLET BY MOUTH TWICE A DAY 60 tablet 3   busPIRone (BUSPAR) 15 MG tablet Take 1 tablet (15 mg total) by mouth 3 (three) times daily. 90 tablet 3   diclofenac Sodium (VOLTAREN) 1 % GEL Apply 2 g topically 4  (four) times daily. 100 g 1   DULoxetine (CYMBALTA) 30 MG capsule Take 1 capsule (30 mg total) by mouth 2 (two) times daily. 60 capsule 3   methocarbamol (ROBAXIN) 500 MG tablet Take 1 tablet (500 mg total) by mouth 4 (four) times daily. 120 tablet 3   naltrexone (DEPADE) 50 MG tablet Take 1 tablet (50 mg total) by mouth daily. 90 tablet 3   norethindrone-ethinyl estradiol-FE (HAILEY FE 1/20) 1-20 MG-MCG tablet Take 1 tablet by mouth daily. 84 tablet 4   ondansetron (ZOFRAN-ODT) 4 MG disintegrating tablet Take 1 tablet (4 mg total) by mouth every 8 (eight) hours as needed for nausea or vomiting. 20 tablet 0   propranolol (INDERAL) 20 MG tablet Take 1 tablet (20 mg total) by mouth 3 (three) times daily. 90 tablet 3   QUEtiapine (SEROQUEL) 50 MG tablet Take 1 tablet (50 mg total) by mouth at bedtime. 30 tablet 3   rizatriptan (MAXALT) 10 MG tablet TAKE ONE TABLET BY MOUTH AS NEEDED *may repeat in 2 hours if needed* 90 tablet 3   traZODone (DESYREL) 100 MG tablet TAKE ONE TABLET BY MOUTH DAILY AT BEDTIME 30 tablet 0   triamcinolone cream (KENALOG) 0.1 % Apply to affected area 1-2 times  daily 30 g 0   No current facility-administered medications on file prior to visit.    Her sponsor has left the country to [place] but she has had her sponsor sponsor taking her to [place] and she has done at least 7 meetings since last visit.  Reading the big book again.  Though she has had recall issues recently which I suspect are related to alcohol withdrawal she feels confident that she has not had any recent drinking and she is taking the vitamins to help her rate.  She has yet to see her psychiatrist and for right now is just planning on following up with me only so I agreed to take over any psych meds that she runs short on and I offered to adjust any psych meds that she feels needs adjusted she reports her mood is a 7 out of 10 with 10 being the worst possible mood   Overall it seems like she is making  progress still because of her continued working with her sponsor sponsor and taking the acamprosate and that the main things holding her back from further progress and recovery are poor sleep quality most likely which were going to investigate and poor mood quality which I am going to try to address with Vraylar.  I also am thinking of adding a weight loss medication to try to improve her sleep quality and mood but will wait till next visit after reviewing the snore studies from snore lab       Subjective:   Natasha Pope is a 26 y.o. female with PMH significant for: Past Medical History:  Diagnosis Date   Anxiety    Asthma    Depression    Memory loss 04/03/2022   A/w alcohol use in past. Suspect due to vitamin deficiency Recommended she take daily mvi and continue to abstain from EtOH   PTSD (post-traumatic stress disorder) 03/19/2022   Pyelonephritis    Seizure-like activity ([place])    Seizures ([place])    Sepsis ([place])     We are doing mood and alc use disorder close follow up She feels in w/d, had n/v this am, no drink in 11 days    Objective:  Physical Exam: BP (!) 139/97 (BP Location: Left Arm, Patient Position: Sitting)   Pulse 75   Temp (!) 97.1 F (36.2 C) (Temporal)   Resp 14   Ht $R'5\' 6"'[place]$  (1.676 m)   Wt 182 lb 6.4 oz (82.7 kg)   SpO2 (!) 86%   BMI 29.44 kg/m   She  is a polite, friendly, and genuine person Constitutional: NAD, AAO, not ill-appearing  Neuro: alert, no focal deficit obvious, articulate speech Psych: normal mood, behavior, thought content   Problem specific physical exam findings:  Anxious, no tremor.  No images are attached to the encounter or orders placed in the encounter.    Results:  No results found for any visits on 04/17/22.   Recent Results (from the past 2160 hour(s))  Comp Met (CMET)     Status: Abnormal   Collection Time: 03/07/22  4:27 PM  Result Value Ref Range   Sodium 140 135 - 145 mEq/L   Potassium 2.9 (L) 3.5 - 5.1  mEq/L   Chloride 103 96 - 112 mEq/L   CO2 24 19 - 32 mEq/L   Glucose, Bld 98 70 - 99 mg/dL   BUN 4 (L) 6 - 23 mg/dL   Creatinine, Ser 0.75 0.40 - 1.20 mg/dL   Total  Bilirubin 0.3 0.2 - 1.2 mg/dL   Alkaline Phosphatase 53 39 - 117 U/L   AST 25 0 - 37 U/L   ALT 15 0 - 35 U/L   Total Protein 7.6 6.0 - 8.3 g/dL   Albumin 4.6 3.5 - 5.2 g/dL   GFR 110.76 >60.00 mL/min    Comment: Calculated using the CKD-EPI Creatinine Equation (2021)   Calcium 8.7 8.4 - 10.5 mg/dL  CBC with Differential/Platelet     Status: Abnormal   Collection Time: 03/07/22  4:27 PM  Result Value Ref Range   WBC 6.4 4.0 - 10.5 K/uL   RBC 3.42 (L) 3.87 - 5.11 Mil/uL   Hemoglobin 11.9 (L) 12.0 - 15.0 g/dL   HCT 35.5 (L) 36.0 - 46.0 %   MCV 103.9 (H) 78.0 - 100.0 fl   MCHC 33.4 30.0 - 36.0 g/dL   RDW 26.1 (H) 11.5 - 15.5 %   Platelets 308.0 150.0 - 400.0 K/uL   Neutrophils Relative % 61.6 43.0 - 77.0 %   Lymphocytes Relative 30.6 12.0 - 46.0 %   Monocytes Relative 5.4 3.0 - 12.0 %   Eosinophils Relative 0.6 0.0 - 5.0 %   Basophils Relative 1.8 0.0 - 3.0 %   Neutro Abs 3.9 1.4 - 7.7 K/uL   Lymphs Abs 1.9 0.7 - 4.0 K/uL   Monocytes Absolute 0.3 0.1 - 1.0 K/uL   Eosinophils Absolute 0.0 0.0 - 0.7 K/uL   Basophils Absolute 0.1 0.0 - 0.1 K/uL  Lyme Disease Serology w/Reflex     Status: None   Collection Time: 03/07/22  4:27 PM  Result Value Ref Range   Lyme Total Antibody EIA Negative Negative    Comment: Lyme antibodies not detected. Reflex testing is not indicated. No laboratory evidence of infection with B. burgdorferi (Lyme disease). Negative results may occur in patients recently infected (less than or equal to 14 days) with B. burgdorferi.  If recent infection is suspected, repeat testing on a new sample collected in 7 to 14 days is recommended.   CBC with Differential     Status: Abnormal   Collection Time: 03/08/22 12:07 PM  Result Value Ref Range   WBC 6.5 3.8 - 10.8 Thousand/uL   RBC 3.44 (L) 3.80 -  5.10 Million/uL   Hemoglobin 12.0 11.7 - 15.5 g/dL   HCT 33.8 (L) 35.0 - 45.0 %   MCV 98.3 80.0 - 100.0 fL   MCH 34.9 (H) 27.0 - 33.0 pg   MCHC 35.5 32.0 - 36.0 g/dL   RDW 22.9 (H) 11.0 - 15.0 %   Platelets 323 140 - 400 Thousand/uL   MPV 10.0 7.5 - 12.5 fL   Neutro Abs 4,069 1,500 - 7,800 cells/uL   Lymphs Abs 2,035 850 - 3,900 cells/uL   Absolute Monocytes 332 200 - 950 cells/uL   Eosinophils Absolute 33 15 - 500 cells/uL   Basophils Absolute 33 0 - 200 cells/uL   Neutrophils Relative % 62.6 %   Total Lymphocyte 31.3 %   Monocytes Relative 5.1 %   Eosinophils Relative 0.5 %   Basophils Relative 0.5 %  Comp Met (CMET)     Status: None   Collection Time: 03/19/22  3:32 PM  Result Value Ref Range   Sodium 139 135 - 145 mEq/L   Potassium 4.6 3.5 - 5.1 mEq/L   Chloride 102 96 - 112 mEq/L   CO2 22 19 - 32 mEq/L   Glucose, Bld 81 70 - 99 mg/dL   BUN  7 6 - 23 mg/dL   Creatinine, Ser 0.83 0.40 - 1.20 mg/dL   Total Bilirubin 0.5 0.2 - 1.2 mg/dL   Alkaline Phosphatase 60 39 - 117 U/L   AST 27 0 - 37 U/L   ALT 14 0 - 35 U/L   Total Protein 8.2 6.0 - 8.3 g/dL   Albumin 4.7 3.5 - 5.2 g/dL   GFR 98.05 >60.00 mL/min    Comment: Calculated using the CKD-EPI Creatinine Equation (2021)   Calcium 9.9 8.4 - 10.5 mg/dL

## 2022-04-25 ENCOUNTER — Other Ambulatory Visit: Payer: Self-pay | Admitting: Behavioral Health

## 2022-04-25 ENCOUNTER — Encounter: Payer: Self-pay | Admitting: Internal Medicine

## 2022-04-25 ENCOUNTER — Ambulatory Visit: Payer: 59 | Admitting: Internal Medicine

## 2022-04-25 VITALS — BP 118/87 | HR 88 | Temp 97.2°F | Resp 14 | Ht 66.0 in | Wt 180.2 lb

## 2022-04-25 DIAGNOSIS — F1021 Alcohol dependence, in remission: Secondary | ICD-10-CM | POA: Diagnosis not present

## 2022-04-25 DIAGNOSIS — F5105 Insomnia due to other mental disorder: Secondary | ICD-10-CM

## 2022-04-25 MED ORDER — GABAPENTIN 300 MG PO CAPS: 300.0000 mg | ORAL_CAPSULE | Freq: Three times a day (TID) | ORAL | 3 refills | Status: DC

## 2022-04-25 NOTE — Telephone Encounter (Signed)
Pt is scheduled 10/12 

## 2022-04-25 NOTE — Telephone Encounter (Signed)
Please call patient to schedule an appt.-

## 2022-04-25 NOTE — Assessment & Plan Note (Signed)
Encouraged her that she is doing great staying sober hope will be to keep doing it but cravings are bad I therefore encouraged that she continue to stay close with the sponsor sponsor do as many meetings as possible try to find some hobbies that are not triggering and we are going to try to get some additional medications approved to calm down the nerves She is not taking gabapentin so I think him going to put that on board at least for short-term and she will continue to take acamprosate she mainly doing that as needed She also reports she is taking naltrexone consistently and I think that is great encouraged her to keep doing it

## 2022-04-25 NOTE — Progress Notes (Signed)
[place] at [facility]:  [phone]   Routine Medical Office Visit  Patient:  Natasha Pope [place]      Age: 26 y.o.       Sex:  female  Date:   04/25/2022  PCP:    Loralee Pacas, [place] Provider: Loralee Pacas, MD  Assessment/Plan:   Natasha Pope was seen today for follow-up.  Alcohol use disorder, severe, in early remission, dependence ([place]) Assessment & Plan: Encouraged her that she is doing great staying sober hope will be to keep doing it but cravings are bad I therefore encouraged that she continue to stay close with the sponsor sponsor do as many meetings as possible try to find some hobbies that are not triggering and we are going to try to get some additional medications approved to calm down the nerves She is not taking gabapentin so I think him going to put that on board at least for short-term and she will continue to take acamprosate she mainly doing that as needed She also reports she is taking naltrexone consistently and I think that is great encouraged her to keep doing it    She lost 3 lb due to not drinking.   Subjective:   Natasha Pope is a 26 y.o. female with PMH significant for: Past Medical History:  Diagnosis Date   Anxiety    Asthma    Depression    Memory loss 04/03/2022   A/w alcohol use in past. 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   Technique we just overweight by BMI but she puts a lot of the weight on her abdomen so I am documenting this as obesity is at least truncal obesity therefore I am going to try to send in for [facility] and see if insurance will cover as not only would help weight loss but actually will reduce her desire to drink   PTSD (post-traumatic stress disorder) 03/19/2022   Pyelonephritis    Seizure-like activity ([place])    Seizures ([place])    Sepsis  ([place])    Snoring 04/17/2022     She main concern for today's visit is: Chief Complaint  Patient presents with   Follow-up    1 week. Everything is still going well.     Additional physician collected history: She reports that she has not had a single drink since our last visit and is feeling well but does identify a stressor of difficulty finding a job and she despite trying as well as she does not have a whole lot of hobbies to replace drinking and boredom as a trigger.  She does some drawing some laundry and reading and watching movies. Reports she is still taking all of the medications I have prescribed for reducing cravings she reports that her cravings are like a 6 out of 10 so not they have been pretty troublesome but she has been able to control them as she has been staying in contact with her sponsor sponsor while her sponsor is out of country She reports she is getting some hot flashes still and feels like she is in withdrawal despite taking all of the medications and despite it having been quite sometime now since she last had a drink She has not seen psychiatry or so's or heard from behavioral health therapy yet for assistance  Did not Vraylar last week but she has not been able to get it approved yet but I am still hopeful that we can move from Seroquel to this for managing presumed bipolar          Objective:  Physical Exam: BP 118/87 (BP Location: Left Arm, Patient Position: Sitting)   Pulse 88   Temp (!) 97.2 F (36.2 C) (Temporal)   Resp 14   Ht _0  (1.676 m)   Wt 180 lb 3.2 oz (81.7 kg)   SpO2 90%   BMI 29.09 kg/m   She  is a polite, friendly, and genuine person Constitutional: NAD, AAO, not ill-appearing  Neuro: alert, no focal deficit obvious, articulate speech Psych: normal mood, behavior, thought content   Problem specific physical exam findings:    No images are attached to the encounter or orders placed in the encounter.    Results:  No results  found for any visits on 04/25/22.   Recent Results (from the past 2160 hour(s))  Comp Met (CMET)     Status: Abnormal   Collection Time: 03/07/22  4:27 PM  Result Value Ref Range   Sodium 140 135 - 145 mEq/L   Potassium 2.9 (L) 3.5 - 5.1 mEq/L   Chloride 103 96 - 112 mEq/L   CO2 24 19 - 32 mEq/L   Glucose, Bld 98 70 - 99 mg/dL   BUN 4 (L) 6 - 23 mg/dL   Creatinine, Ser 0.75 0.40 - 1.20 mg/dL   Total Bilirubin 0.3 0.2 - 1.2 mg/dL   Alkaline Phosphatase 53 39 - 117 U/L   AST 25 0 - 37 U/L   ALT 15 0 - 35 U/L   Total Protein 7.6 6.0 - 8.3 g/dL   Albumin 4.6 3.5 - 5.2 g/dL   GFR 110.76 >60.00 mL/min    Comment: Calculated using the CKD-EPI Creatinine Equation (2021)   Calcium 8.7 8.4 - 10.5 mg/dL  CBC with Differential/Platelet     Status: Abnormal   Collection Time: 03/07/22  4:27 PM  Result Value Ref Range   WBC 6.4 4.0 - 10.5 K/uL   RBC 3.42 (L) 3.87 - 5.11 Mil/uL   Hemoglobin 11.9 (L) 12.0 - 15.0 g/dL   HCT 35.5 (L) 36.0 - 46.0 %   MCV 103.9 (H) 78.0 - 100.0 fl   MCHC 33.4 30.0 - 36.0 g/dL   RDW 26.1 (H) 11.5 - 15.5 %   Platelets 308.0 150.0 - 400.0 K/uL   Neutrophils Relative % 61.6 43.0 - 77.0 %   Lymphocytes Relative 30.6 12.0 - 46.0 %   Monocytes Relative 5.4 3.0 - 12.0 %   Eosinophils Relative 0.6 0.0 - 5.0 %   Basophils Relative 1.8 0.0 - 3.0 %   Neutro Abs 3.9 1.4 - 7.7 K/uL   Lymphs Abs 1.9 0.7 - 4.0 K/uL   Monocytes Absolute 0.3 0.1 - 1.0 K/uL   Eosinophils Absolute 0.0 0.0 - 0.7 K/uL   Basophils Absolute 0.1 0.0 - 0.1 K/uL  Lyme Disease Serology w/Reflex     Status: None   Collection Time: 03/07/22  4:27 PM  Result Value Ref Range   Lyme Total Antibody EIA Negative Negative    Comment: Lyme antibodies not detected. Reflex testing is not indicated. No laboratory evidence of infection with B. burgdorferi (Lyme disease). Negative results may occur in patients recently infected (less than or equal to 14 days) with B. burgdorferi.  If recent infection  is suspected, repeat testing on a  new sample collected in 7 to 14 days is recommended.   CBC with Differential     Status: Abnormal   Collection Time: 03/08/22 12:07 PM  Result Value Ref Range   WBC 6.5 3.8 - 10.8 Thousand/uL   RBC 3.44 (L) 3.80 - 5.10 Million/uL   Hemoglobin 12.0 11.7 - 15.5 g/dL   HCT 33.8 (L) 35.0 - 45.0 %   MCV 98.3 80.0 - 100.0 fL   MCH 34.9 (H) 27.0 - 33.0 pg   MCHC 35.5 32.0 - 36.0 g/dL   RDW 22.9 (H) 11.0 - 15.0 %   Platelets 323 140 - 400 Thousand/uL   MPV 10.0 7.5 - 12.5 fL   Neutro Abs 4,069 1,500 - 7,800 cells/uL   Lymphs Abs 2,035 850 - 3,900 cells/uL   Absolute Monocytes 332 200 - 950 cells/uL   Eosinophils Absolute 33 15 - 500 cells/uL   Basophils Absolute 33 0 - 200 cells/uL   Neutrophils Relative % 62.6 %   Total Lymphocyte 31.3 %   Monocytes Relative 5.1 %   Eosinophils Relative 0.5 %   Basophils Relative 0.5 %  Comp Met (CMET)     Status: None   Collection Time: 03/19/22  3:32 PM  Result Value Ref Range   Sodium 139 135 - 145 mEq/L   Potassium 4.6 3.5 - 5.1 mEq/L   Chloride 102 96 - 112 mEq/L   CO2 22 19 - 32 mEq/L   Glucose, Bld 81 70 - 99 mg/dL   BUN 7 6 - 23 mg/dL   Creatinine, Ser 0.83 0.40 - 1.20 mg/dL   Total Bilirubin 0.5 0.2 - 1.2 mg/dL   Alkaline Phosphatase 60 39 - 117 U/L   AST 27 0 - 37 U/L   ALT 14 0 - 35 U/L   Total Protein 8.2 6.0 - 8.3 g/dL   Albumin 4.7 3.5 - 5.2 g/dL   GFR 98.05 >60.00 mL/min    Comment: Calculated using the CKD-EPI Creatinine Equation (2021)   Calcium 9.9 8.4 - 10.5 mg/dL

## 2022-05-03 ENCOUNTER — Ambulatory Visit (INDEPENDENT_AMBULATORY_CARE_PROVIDER_SITE_OTHER): Payer: 59 | Admitting: Behavioral Health

## 2022-05-03 ENCOUNTER — Encounter: Payer: Self-pay | Admitting: Behavioral Health

## 2022-05-03 VITALS — BP 141/102 | HR 94

## 2022-05-03 DIAGNOSIS — F99 Mental disorder, not otherwise specified: Secondary | ICD-10-CM

## 2022-05-03 DIAGNOSIS — F5105 Insomnia due to other mental disorder: Secondary | ICD-10-CM | POA: Diagnosis not present

## 2022-05-03 DIAGNOSIS — F1020 Alcohol dependence, uncomplicated: Secondary | ICD-10-CM

## 2022-05-03 DIAGNOSIS — F331 Major depressive disorder, recurrent, moderate: Secondary | ICD-10-CM | POA: Diagnosis not present

## 2022-05-03 DIAGNOSIS — F411 Generalized anxiety disorder: Secondary | ICD-10-CM

## 2022-05-03 NOTE — Progress Notes (Signed)
[facility]  Patient ID: Natasha Pope,  MRN: [id number]  PCP: Loralee Pacas, MD  Date of Evaluation: 05/03/2022 Time spent:40 minutes  Chief Complaint:  Chief Complaint   Anxiety; Depression; Follow-up; Alcohol Problem; Patient Education; Medication Refill     HISTORY/CURRENT STATUS: HPI 26 year old female presents to this office for follow up and medication management. She continues to be sluggish, appears to be under the influence of uknown substance or ETOH. It could possibly be effects from Gabapentin. She denies all. Seems to be still somewhat confused about refills. Her grandmother is assisting financially and comes in to help get her medications.   She says her anxiety level today is 2/10 but her depression is 3/10. She keep reassuring me that she is ok.  She does want to makes changes to medications to treat her depression. Still staying in Extended Stay in [place].  She appears distressed but denies same. Says that her grandmother is available if she needs support.  She is requesting no changes to her medications at this time. Says she is sleeping 7 hours per night. She denies any mania, no psychosis. No SI/HI. She has verbally contracted for safety with this Probation officer and understands resources available in emergency.    Past psychiatric medication trials; Pt cannot advise on complete list. Naltrexone Duloxetine Buproprion Klonopin Wellbutrin Trazodone Abilify        Individual Medical History/ Review of Systems: Changes? :No   Allergies: Lamotrigine  Current Medications:  Current Outpatient Medications:    acamprosate (CAMPRAL) 333 MG tablet, Take 2 tablets (666 mg total) by mouth 3 (three) times daily with meals., Disp: 90 tablet, Rfl: 5   AUVELITY 45-105 MG TBCR, TAKE 1 TABLET BY MOUTH TWICE A DAY, Disp: 60 tablet, Rfl: 3   busPIRone (BUSPAR) 15 MG tablet, Take 1 tablet (15 mg total) by mouth 3 (three) times daily., Disp: 90 tablet, Rfl:  3   cariprazine (VRAYLAR) 3 MG capsule, Take 1 capsule (3 mg total) by mouth daily., Disp: 90 capsule, Rfl: 3   DULoxetine (CYMBALTA) 30 MG capsule, Take 1 capsule (30 mg total) by mouth 2 (two) times daily., Disp: 60 capsule, Rfl: 3   gabapentin (NEURONTIN) 300 MG capsule, Take 1 capsule (300 mg total) by mouth 3 (three) times daily., Disp: 90 capsule, Rfl: 3   methocarbamol (ROBAXIN) 500 MG tablet, Take 1 tablet (500 mg total) by mouth 4 (four) times daily., Disp: 120 tablet, Rfl: 3   naltrexone (DEPADE) 50 MG tablet, Take 1 tablet (50 mg total) by mouth daily., Disp: 90 tablet, Rfl: 3   norethindrone-ethinyl estradiol-FE (HAILEY FE 1/20) 1-20 MG-MCG tablet, Take 1 tablet by mouth daily., Disp: 84 tablet, Rfl: 4   propranolol (INDERAL) 20 MG tablet, Take 1 tablet (20 mg total) by mouth 3 (three) times daily., Disp: 90 tablet, Rfl: 3   QUEtiapine (SEROQUEL) 50 MG tablet, Take 1 tablet (50 mg total) by mouth at bedtime., Disp: 30 tablet, Rfl: 3   rizatriptan (MAXALT) 10 MG tablet, TAKE ONE TABLET BY MOUTH AS NEEDED *may repeat in 2 hours if needed*, Disp: 90 tablet, Rfl: 3   traZODone (DESYREL) 100 MG tablet, TAKE ONE TABLET BY MOUTH DAILY AT BEDTIME, Disp: 30 tablet, Rfl: 0   Semaglutide-Weight Management 0.25 MG/0.5ML SOAJ, Inject 0.25 mg into the skin once a week for 28 days. (Patient not taking: Reported on 05/03/2022), Disp: 2 mL, Rfl: 0   [START ON 05/16/2022] Semaglutide-Weight Management 0.5 MG/0.5ML SOAJ, Inject 0.5  mg into the skin once a week for 28 days. (Patient not taking: Reported on 05/03/2022), Disp: 2 mL, Rfl: 0   [START ON 06/14/2022] Semaglutide-Weight Management 1 MG/0.5ML SOAJ, Inject 1 mg into the skin once a week for 28 days. (Patient not taking: Reported on 05/03/2022), Disp: 2 mL, Rfl: 0   [START ON 07/13/2022] Semaglutide-Weight Management 1.7 MG/0.75ML SOAJ, Inject 1.7 mg into the skin once a week for 28 days. (Patient not taking: Reported on 05/03/2022), Disp: 3 mL, Rfl:  0   [START ON 08/11/2022] Semaglutide-Weight Management 2.4 MG/0.75ML SOAJ, Inject 2.4 mg into the skin once a week for 28 days. (Patient not taking: Reported on 05/03/2022), Disp: 3 mL, Rfl: 0 Medication Side Effects: none  Family Medical/ Social History: Changes? No  MENTAL HEALTH EXAM:  There were no vitals taken for this visit.There is no height or weight on file to calculate BMI.  General Appearance: Casual and Neat  Eye Contact:  Good  Speech:  Slow  Volume:  Normal  Mood:  Anxious  Affect:  Congruent, Flat, and Anxious  Thought Process:  Coherent  Orientation:  Full (Time, Place, and Person)  Thought Content: Logical   Suicidal Thoughts:  No  Homicidal Thoughts:  No  Memory:  WNL  Judgement:  Fair  Insight:  Fair  Psychomotor Activity:  Normal  Concentration:  Concentration: Fair  Recall:  Fair  Fund of Knowledge: Fair  Language: Fair  Assets:  Desire for Improvement Physical Health Resilience Social Support  ADL's:  Intact  Cognition: WNL  Prognosis:  Fair    DIAGNOSES: No diagnosis found.  Receiving Psychotherapy: No    RECOMMENDATIONS:  Greater than 50% of 30  min face to face time with patient was spent on counseling and coordination of care.We discussed her current stability.  She has new PCP Glenetta Hew, MD that is following regularly. She did admit this visit that she has had two relapses.  Her eyes are droopy and speech is slow this visit. I am still not convinced she is being a 100 % candid with me. She was chewing gum but when checking BP, I may have detected a slight odor of ETOH.  She is back living at Extended Stay. She says she was laid off from job.  Grandmother is still assisting.  She is not attending regular AA meetings.  I reconciled medications with her. She was unsure about [facility] but says she is picking up all medication from [facility].  Natasha Pope assured me she would utilize resources if she felt she was slipping backwards.  I did discuss with  her Vivitrol for ETOH cravings and will discuss further next visit.  Her BP was very high today. 142/102. Checked twice 5 min apart for accuracy. Advised her to monitor closely and inform PCP.    We agree to:   Continue Auvelity 90/210 mg daily Continue Buspar  to 15 mg three times daily as needed  Continue Duloxetine 30 mg tablet twice daily (total 60 mg) morning and evening. Continue Naltrexone 50 mg daily Continue Propranolol 20 mg three times daily as needed Continue Seroquel 50 mg at bedtime Will report side effects or worsening symptoms or relapse To follow up regularly with is office and PCP as required Will follow up in 4 weeks to reassess Provided emergency contact and support information She is currently on [facility] Reviewed PDMP     Joan Flores, NP

## 2022-05-09 ENCOUNTER — Ambulatory Visit: Payer: 59 | Admitting: Internal Medicine

## 2022-05-11 ENCOUNTER — Encounter: Payer: Self-pay | Admitting: Internal Medicine

## 2022-05-11 ENCOUNTER — Ambulatory Visit: Payer: 59 | Admitting: Internal Medicine

## 2022-05-11 VITALS — BP 138/91 | HR 77 | Temp 98.0°F | Ht 66.0 in | Wt 193.8 lb

## 2022-05-11 DIAGNOSIS — F319 Bipolar disorder, unspecified: Secondary | ICD-10-CM | POA: Diagnosis not present

## 2022-05-11 DIAGNOSIS — F1094 Alcohol use, unspecified with alcohol-induced mood disorder: Secondary | ICD-10-CM

## 2022-05-11 MED ORDER — ARIPIPRAZOLE 2 MG PO TABS: 2.0000 mg | ORAL_TABLET | Freq: Every day | ORAL | 5 refills | Status: DC

## 2022-05-11 NOTE — Patient Instructions (Addendum)
It was a pleasure seeing you today!  Today the plan is... Use your therapy but and try to call the counselor and restart Abilify and be sure to check the list of things to do instead of drinking when you have cravings and be sure to keep talking to your sponsor sponsor  Bipolar depression ([place]) -     ARIPiprazole; Take 1 tablet (2 mg total) by mouth daily.  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  Therefore I sent Abilify again she denies any recent mania.  She has had some recent irritability with work so hopefully Abilify will help with that and I also talked about using Po which is a chat bot with therapists to try to manage anxiety and alcohol cravings and I strongly encouraged her to continue all of the current medications which include gabapentin and Campral and naltrexone     Loralee Pacas, MD   Return in about 1 week (around 05/18/2022).

## 2022-05-11 NOTE — Assessment & Plan Note (Signed)
I thought she was on Abilify but apparently it is not on her med list and we were trying to get Vraylar but it was too expensive and I tried to have her use the savings card but it says does not seem to be working.  Therefore I sent Abilify again she denies any recent mania.  She has had some recent irritability with work so hopefully Abilify will help with that and I also talked about using Po which is a chat bot with therapists to try to manage anxiety and alcohol cravings and I strongly encouraged her to continue all of the current medications which include gabapentin and Campral and naltrexone

## 2022-05-11 NOTE — Progress Notes (Signed)
[place] at [employer]:  [phone]   Routine Medical Office Visit  Patient:  Natasha Pope [place]      Age: 26 y.o.       Sex:  female  Date:   05/11/2022  PCP:    Loralee Pacas, [place] Provider: Loralee Pacas, MD  Assessment/Plan:   Natasha Pope was seen today for follow-up.  Bipolar depression (HCC) -     ARIPiprazole; Take 1 tablet (2 mg total) by mouth daily.  Dispense: 30 tablet; Refill: 5  Alcohol use with alcohol-induced mood disorder (HCC) Overview: ? H/o bipolar used to be on lithium but on abilify now and no diagnosis being given for mood disorder but she endorses hallucinations auditory and visual.  These symptoms are controlled by aripiprazole/seroquel from psych.   Its unclear to me if the hallucinations are alcohol withdrawal related.  She denies any other substance use.   Assessment & Plan: I thought she was on Abilify but apparently it is not on her med list and we were trying to get Vraylar but it was too expensive and I tried to have her use the savings card but it says does not seem to be working.  Therefore I sent Abilify again she denies any recent mania.  She has had some recent irritability with work so hopefully Abilify will help with that and I also talked about using Po which is a chat bot with therapists to try to manage anxiety and alcohol cravings and I strongly encouraged her to continue all of the current medications which include gabapentin and Campral and naltrexone    Return in about 1 week (around 05/18/2022).       Subjective:   Natasha Pope is a 26 y.o. female with PMH significant for: Past Medical History:  Diagnosis Date   Anxiety    Asthma    Depression    Memory loss 04/03/2022   A/w alcohol use in past. 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    Technique we just overweight by BMI but she puts a lot of the weight on her abdomen so I am documenting this as obesity is at least truncal obesity therefore I am going to try to send in for [facility] and see if insurance will cover as not only would help weight loss but actually will reduce her desire to drink   PTSD (post-traumatic stress disorder) 03/19/2022   Pyelonephritis    Seizure-like activity ([place])    Seizures ([place])    Sepsis ([place])    Snoring 04/17/2022     She is presenting today with: Chief Complaint  Patient presents with   Follow-up    Additional physician collected history: See Assessment/Plan section for per problem updates to history (overview and a/p subsections) as reported by patient today. 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 Trying to get first shift.         Objective:  Physical Exam: BP (!) 138/91 (BP Location: Right Arm, Patient Position: Sitting)   Pulse 77   Temp 98 F (36.7 C) (Temporal)   Ht 5' 6" (1.676 m)   Wt 193 lb 12.8 oz (87.9 kg)   LMP 05/01/2022 (Approximate)   SpO2 100%  BMI 31.28 kg/m   She is a polite, friendly, and genuine person Constitutional: NAD, AAO, not ill-appearing Neuro: alert, no focal deficit obvious, articulate speech Psych: normal mood, behavior, thought content   Problem specific physical exam findings:  Relaxed. Tremors almost completely imperceptible.     Recent Results (from the past 2160 hour(s))  Comp Met (CMET)     Status: Abnormal   Collection Time: 03/07/22  4:27 PM  Result Value Ref Range   Sodium 140 135 - 145 mEq/L   Potassium 2.9 (L) 3.5 - 5.1 mEq/L   Chloride 103 96 - 112 mEq/L   CO2 24 19 - 32 mEq/L   Glucose, Bld 98 70 - 99 mg/dL   BUN 4 (L) 6 - 23 mg/dL   Creatinine, Ser 0.75 0.40 - 1.20 mg/dL   Total Bilirubin 0.3 0.2 - 1.2 mg/dL   Alkaline Phosphatase 53 39 - 117 U/L    AST 25 0 - 37 U/L   ALT 15 0 - 35 U/L   Total Protein 7.6 6.0 - 8.3 g/dL   Albumin 4.6 3.5 - 5.2 g/dL   GFR 110.76 >60.00 mL/min    Comment: Calculated using the CKD-EPI Creatinine Equation (2021)   Calcium 8.7 8.4 - 10.5 mg/dL  CBC with Differential/Platelet     Status: Abnormal   Collection Time: 03/07/22  4:27 PM  Result Value Ref Range   WBC 6.4 4.0 - 10.5 K/uL   RBC 3.42 (L) 3.87 - 5.11 Mil/uL   Hemoglobin 11.9 (L) 12.0 - 15.0 g/dL   HCT 35.5 (L) 36.0 - 46.0 %   MCV 103.9 (H) 78.0 - 100.0 fl   MCHC 33.4 30.0 - 36.0 g/dL   RDW 26.1 (H) 11.5 - 15.5 %   Platelets 308.0 150.0 - 400.0 K/uL   Neutrophils Relative % 61.6 43.0 - 77.0 %   Lymphocytes Relative 30.6 12.0 - 46.0 %   Monocytes Relative 5.4 3.0 - 12.0 %   Eosinophils Relative 0.6 0.0 - 5.0 %   Basophils Relative 1.8 0.0 - 3.0 %   Neutro Abs 3.9 1.4 - 7.7 K/uL   Lymphs Abs 1.9 0.7 - 4.0 K/uL   Monocytes Absolute 0.3 0.1 - 1.0 K/uL   Eosinophils Absolute 0.0 0.0 - 0.7 K/uL   Basophils Absolute 0.1 0.0 - 0.1 K/uL  Lyme Disease Serology w/Reflex     Status: None   Collection Time: 03/07/22  4:27 PM  Result Value Ref Range   Lyme Total Antibody EIA Negative Negative    Comment: Lyme antibodies not detected. Reflex testing is not indicated. No laboratory evidence of infection with B. burgdorferi (Lyme disease). Negative results may occur in patients recently infected (less than or equal to 14 days) with B. burgdorferi.  If recent infection is suspected, repeat testing on a new sample collected in 7 to 14 days is recommended.   CBC with Differential     Status: Abnormal   Collection Time: 03/08/22 12:07 PM  Result Value Ref Range   WBC 6.5 3.8 - 10.8 Thousand/uL   RBC 3.44 (L) 3.80 - 5.10 Million/uL   Hemoglobin 12.0 11.7 - 15.5 g/dL   HCT 33.8 (L) 35.0 - 45.0 %   MCV 98.3 80.0 - 100.0 fL   MCH 34.9 (H) 27.0 - 33.0 pg   MCHC 35.5 32.0 - 36.0 g/dL   RDW 22.9 (H) 11.0 - 15.0 %   Platelets 323 140 - 400 Thousand/uL    MPV 10.0 7.5 - 12.5 fL  Neutro Abs 4,069 1,500 - 7,800 cells/uL   Lymphs Abs 2,035 850 - 3,900 cells/uL   Absolute Monocytes 332 200 - 950 cells/uL   Eosinophils Absolute 33 15 - 500 cells/uL   Basophils Absolute 33 0 - 200 cells/uL   Neutrophils Relative % 62.6 %   Total Lymphocyte 31.3 %   Monocytes Relative 5.1 %   Eosinophils Relative 0.5 %   Basophils Relative 0.5 %  Comp Met (CMET)     Status: None   Collection Time: 03/19/22  3:32 PM  Result Value Ref Range   Sodium 139 135 - 145 mEq/L   Potassium 4.6 3.5 - 5.1 mEq/L   Chloride 102 96 - 112 mEq/L   CO2 22 19 - 32 mEq/L   Glucose, Bld 81 70 - 99 mg/dL   BUN 7 6 - 23 mg/dL   Creatinine, Ser 0.83 0.40 - 1.20 mg/dL   Total Bilirubin 0.5 0.2 - 1.2 mg/dL   Alkaline Phosphatase 60 39 - 117 U/L   AST 27 0 - 37 U/L   ALT 14 0 - 35 U/L   Total Protein 8.2 6.0 - 8.3 g/dL   Albumin 4.7 3.5 - 5.2 g/dL   GFR 98.05 >60.00 mL/min    Comment: Calculated using the CKD-EPI Creatinine Equation (2021)   Calcium 9.9 8.4 - 10.5 mg/dL

## 2022-05-12 ENCOUNTER — Other Ambulatory Visit: Payer: Self-pay | Admitting: Behavioral Health

## 2022-05-12 DIAGNOSIS — F4001 Agoraphobia with panic disorder: Secondary | ICD-10-CM

## 2022-05-12 DIAGNOSIS — F411 Generalized anxiety disorder: Secondary | ICD-10-CM

## 2022-05-12 DIAGNOSIS — F331 Major depressive disorder, recurrent, moderate: Secondary | ICD-10-CM

## 2022-05-12 DIAGNOSIS — F99 Mental disorder, not otherwise specified: Secondary | ICD-10-CM

## 2022-05-14 ENCOUNTER — Other Ambulatory Visit: Payer: Self-pay | Admitting: Behavioral Health

## 2022-05-14 DIAGNOSIS — F330 Major depressive disorder, recurrent, mild: Secondary | ICD-10-CM

## 2022-05-14 DIAGNOSIS — F411 Generalized anxiety disorder: Secondary | ICD-10-CM

## 2022-05-18 ENCOUNTER — Ambulatory Visit: Payer: 59 | Admitting: Internal Medicine

## 2022-05-18 ENCOUNTER — Encounter: Payer: Self-pay | Admitting: Internal Medicine

## 2022-05-18 VITALS — BP 160/117 | HR 78 | Temp 97.8°F | Resp 14 | Ht 66.0 in | Wt 191.0 lb

## 2022-05-18 DIAGNOSIS — F10229 Alcohol dependence with intoxication, unspecified: Secondary | ICD-10-CM | POA: Diagnosis not present

## 2022-05-18 DIAGNOSIS — F1021 Alcohol dependence, in remission: Secondary | ICD-10-CM | POA: Diagnosis not present

## 2022-05-18 DIAGNOSIS — F1094 Alcohol use, unspecified with alcohol-induced mood disorder: Secondary | ICD-10-CM | POA: Diagnosis not present

## 2022-05-18 MED ORDER — GABAPENTIN 100 MG PO CAPS: 100.0000 mg | ORAL_CAPSULE | Freq: Three times a day (TID) | ORAL | 3 refills | Status: DC

## 2022-05-18 NOTE — Assessment & Plan Note (Signed)
Minded her to call for the EMDR professional therapy Encouraged her to continue seeing her sponsor Encouraged continued 12 steps Doing very well stable staying in early recovery and consistently sober Went over the importance of not discontinuing medications prematurely and particularly acamprosate and I discussed its mechanism of action in detail Reviewed all of the meds on the medication list to explain their mechanism of action so she would better understand her treatment plan Indicated to her that she is welcome if she feels ready to try cutting back slowly on buspirone and gabapentin but only 1 at time I made sure to let her know I am very proud of her tremendous progress in recovery she did have a near slip last week where she reached out for her grandmother for help so I also reminded her that it is tenuous-she would like to return for her next visit in a couple weeks which I am excited to continue follow-up for

## 2022-05-18 NOTE — Assessment & Plan Note (Signed)
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  She has stopped follow-up with her psychiatrist at this point although I'd prefer she continue to have psych.... even though she seems to be doing well on mood.   History of bipolar/hallucinations/delusional/DTs/PTSD- best managed by team including [place], psych, and PCP.

## 2022-05-18 NOTE — Patient Instructions (Addendum)
It was a pleasure seeing you today!  Today the plan is... Try to slowly taper down on the buspirone and take maybe just half pills for a week or 2 and if no anxiety increase then go ahead and stop it completely and continue monitoring your anxiety and if its not jumping up then you are fine without it.  Continue taking the Abilify 10 mg dosing in the [place]  full dosing in the meantime we do not want to change more than 1 mood medicine at the time.  We talked about acamprosate and how I strongly strongly encouraged her to keep taking it regularly.  I sent in gabapentin the 100 mg tablets and I am okay with you using the lower dose if you feel like you are in a really strong place but I am also uncertain and I trust your judgment to decide if you would like to just stay on the same dose of everything so as not to destabilize I think you could make the correct choice better than me.  Alcohol use disorder, severe, in early remission, dependence (HCC)     Loralee Pacas, MD   Return in about 2 weeks (around 06/01/2022) for Close follow-up mood and recovery.  If you are not doing as well as expected, call and return to the office sooner If your condition begins to worsen or become severe:  go to the ER If you have follow-up questions / concerns: please contact me via phone [phone] or MyChart messaging Please bring all your medicines to your next appointment. This is the best way for me to know exactly what you're taking.    IF you received an x-ray today, you will receive an invoice from [facility]. Please contact [facility] at [phone] with questions or concerns regarding your invoice.    IF you received labwork today, you will receive an invoice from [facility]. Please contact [facility] at [phone] with questions or concerns regarding your invoice.    Our billing staff will not be able to assist you with questions regarding bills from these companies.    --------------------------------------------------------------------------------------------------------------------  You will be contacted with the lab results as soon as they are available. The fastest way to get your results is to activate your My Chart account. Instructions are located on the last page of this paperwork. If you have not heard from [place] regarding the results in 2 weeks, please contact this office. For any labs or imaging tests, we will call you if the results are significantly abnormal.  Most normal results will be posted to myChart as soon as they are available and I will comment on them there within 2-3 business days.

## 2022-05-18 NOTE — Progress Notes (Signed)
[place] at [employer]:  [phone]   Routine Medical Office Visit  Patient:  Natasha Pope [place]      Age: 26 y.o.       Sex:  female  Date:   05/18/2022  PCP:    Loralee Pacas, [place] Provider: Loralee Pacas, MD  Assessment/Plan:   [place] was seen today for one week follow-up.  Alcohol use disorder, severe, in early remission, dependence ([place]) Overview: Longstanding severe making good progress in recovery as of September and October 2023 H/o multiple seizures in 2020 Has done I/p rehab H/o failing gabapentin and naltrexone H/o multiple seizures in 2020 trying to dc alcohol per grandmother, patient cant remember  Assessment & Plan: Minded her to call for the EMDR professional therapy Encouraged her to continue seeing her sponsor Encouraged continued 12 steps Doing very well stable staying in early recovery and consistently sober Went over the importance of not discontinuing medications prematurely and particularly acamprosate and I discussed its mechanism of action in detail Reviewed all of the meds on the medication list to explain their mechanism of action so she would better understand her treatment plan Indicated to her that she is welcome if she feels ready to try cutting back slowly on buspirone and gabapentin but only 1 at time I made sure to let her know I am very proud of her tremendous progress in recovery she did have a near slip last week where she reached out for her grandmother for help so I also reminded her that it is tenuous-she would like to return for her next visit in a couple weeks which I am excited to continue follow-up for  Orders: -     Gabapentin; Take 1 capsule (100 mg total) by mouth 3 (three) times daily. Use instead of the 300 mg dose to try to reduce total gabapentin usage and taper it down but only as tolerated if you feel you are getting anxiety or tremors okay to go back up on the dose a little   Dispense: 90 capsule; Refill: 3  Alcohol use with uncomplicated intoxication with moderate or severe use disorder (HCC) Overview:       Alcohol use with alcohol-induced mood disorder (HCC) Overview: ? H/o bipolar used to be on lithium but on abilify now and no diagnosis being given for mood disorder but she endorses hallucinations auditory and visual.  These symptoms are controlled by aripiprazole/seroquel from psych.   Its unclear to me if the hallucinations are alcohol withdrawal related.  She denies any other substance use.   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  She has stopped follow-up with her psychiatrist at this point although I'd prefer she continue to have psych.... even though she seems to be doing well on mood.   History of bipolar/hallucinations/delusional/DTs/PTSD- best managed by team including [place], psych, and PCP.      Return in about 2 weeks (around 06/01/2022) for Close follow-up mood and recovery.     Today's key discussion points - also in After Visit Summary (AVS) Common side effects, risks, benefits, and alternatives for medications and treatment plan prescribed today were discussed, and she expressed understanding of the given instructions.  Medication list was reconciled and patient  instructions and summary information was documented and made available for her to review in the AVS (see AVS).  This note is also available to patient for review for accuracy and understanding. She was encouraged to contact our office by phone or message via MyChart if she has any questions or concerns regarding our treatment plan (see AVS).  No barriers to understanding were identified   Subjective:   Natasha Pope is a 26 y.o. female with PMH  significant for: Past Medical History:  Diagnosis Date   Anxiety    Asthma    Depression    Memory loss 04/03/2022   A/w alcohol use in past. 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   Technique we just overweight by BMI but she puts a lot of the weight on her abdomen so I am documenting this as obesity is at least truncal obesity therefore I am going to try to send in for [facility] and see if insurance will cover as not only would help weight loss but actually will reduce her desire to drink   PTSD (post-traumatic stress disorder) 03/19/2022   Pyelonephritis    Seizure-like activity ([place])    Seizures ([place])    Sepsis ([place])    Snoring 04/17/2022     She is presenting today with: Chief Complaint  Patient presents with   One week follow-up    Everything is great. BP was high today (had a energy drink this morning and didn't sleep last night).     Additional physician collected history: See Assessment/Plan section for per problem updates to history (overview and a/p subsections) as reported by patient today. She reports everything is great including her job staying sober staying in recovery taking the medications but she is not sleeping well feels exhausted is having cravings. All of her social relationships are doing well and she is still seeing and talking to the sponsor Her significant other Josph Macho is completely abstaining from alcohol and supportive of her goals to do so She has not picked up any exercise yet He did not call the number for the EMDR and has not yet had any professional therapy and our attempt to referral we had a wall with her insurance not covering On a scale of 1-10 with 10 being the happiest she says she is averaging in the sevens        Objective:  Physical Exam: BP (!) 160/117 (BP Location: Right Arm, Patient Position: Sitting)    Pulse 78   Temp 97.8 F (36.6 C)   Resp 14   Ht _0  (1.676 m)   Wt 191 lb (86.6 kg)   LMP 05/01/2022 (Approximate)   SpO2 97%   BMI 30.83 kg/m   She is a polite, friendly, and genuine person Constitutional: NAD, AAO, not ill-appearing  Neuro: alert, no focal deficit obvious, articulate speech Psych: normal mood, behavior, thought content   Problem specific physical exam findings:  No tremors, has difficulty remembering details about her medications but she is oriented.  Results:  No results found for any visits on 05/18/22.   Recent Results (from the past 2160 hour(s))  Comp Met (CMET)     Status: Abnormal   Collection Time: 03/07/22  4:27 PM  Result Value Ref Range   Sodium 140 135 - 145 mEq/L   Potassium 2.9 (L) 3.5 - 5.1  mEq/L   Chloride 103 96 - 112 mEq/L   CO2 24 19 - 32 mEq/L   Glucose, Bld 98 70 - 99 mg/dL   BUN 4 (L) 6 - 23 mg/dL   Creatinine, Ser 0.75 0.40 - 1.20 mg/dL   Total Bilirubin 0.3 0.2 - 1.2 mg/dL   Alkaline Phosphatase 53 39 - 117 U/L   AST 25 0 - 37 U/L   ALT 15 0 - 35 U/L   Total Protein 7.6 6.0 - 8.3 g/dL   Albumin 4.6 3.5 - 5.2 g/dL   GFR 110.76 >60.00 mL/min    Comment: Calculated using the CKD-EPI Creatinine Equation (2021)   Calcium 8.7 8.4 - 10.5 mg/dL  CBC with Differential/Platelet     Status: Abnormal   Collection Time: 03/07/22  4:27 PM  Result Value Ref Range   WBC 6.4 4.0 - 10.5 K/uL   RBC 3.42 (L) 3.87 - 5.11 Mil/uL   Hemoglobin 11.9 (L) 12.0 - 15.0 g/dL   HCT 35.5 (L) 36.0 - 46.0 %   MCV 103.9 (H) 78.0 - 100.0 fl   MCHC 33.4 30.0 - 36.0 g/dL   RDW 26.1 (H) 11.5 - 15.5 %   Platelets 308.0 150.0 - 400.0 K/uL   Neutrophils Relative % 61.6 43.0 - 77.0 %   Lymphocytes Relative 30.6 12.0 - 46.0 %   Monocytes Relative 5.4 3.0 - 12.0 %   Eosinophils Relative 0.6 0.0 - 5.0 %   Basophils Relative 1.8 0.0 - 3.0 %   Neutro Abs 3.9 1.4 - 7.7 K/uL   Lymphs Abs 1.9 0.7 - 4.0 K/uL   Monocytes Absolute 0.3 0.1 - 1.0 K/uL   Eosinophils  Absolute 0.0 0.0 - 0.7 K/uL   Basophils Absolute 0.1 0.0 - 0.1 K/uL  Lyme Disease Serology w/Reflex     Status: None   Collection Time: 03/07/22  4:27 PM  Result Value Ref Range   Lyme Total Antibody EIA Negative Negative    Comment: Lyme antibodies not detected. Reflex testing is not indicated. No laboratory evidence of infection with B. burgdorferi (Lyme disease). Negative results may occur in patients recently infected (less than or equal to 14 days) with B. burgdorferi.  If recent infection is suspected, repeat testing on a new sample collected in 7 to 14 days is recommended.   CBC with Differential     Status: Abnormal   Collection Time: 03/08/22 12:07 PM  Result Value Ref Range   WBC 6.5 3.8 - 10.8 Thousand/uL   RBC 3.44 (L) 3.80 - 5.10 Million/uL   Hemoglobin 12.0 11.7 - 15.5 g/dL   HCT 33.8 (L) 35.0 - 45.0 %   MCV 98.3 80.0 - 100.0 fL   MCH 34.9 (H) 27.0 - 33.0 pg   MCHC 35.5 32.0 - 36.0 g/dL   RDW 22.9 (H) 11.0 - 15.0 %   Platelets 323 140 - 400 Thousand/uL   MPV 10.0 7.5 - 12.5 fL   Neutro Abs 4,069 1,500 - 7,800 cells/uL   Lymphs Abs 2,035 850 - 3,900 cells/uL   Absolute Monocytes 332 200 - 950 cells/uL   Eosinophils Absolute 33 15 - 500 cells/uL   Basophils Absolute 33 0 - 200 cells/uL   Neutrophils Relative % 62.6 %   Total Lymphocyte 31.3 %   Monocytes Relative 5.1 %   Eosinophils Relative 0.5 %   Basophils Relative 0.5 %  Comp Met (CMET)     Status: None   Collection Time: 03/19/22  3:32 PM  Result  Value Ref Range   Sodium 139 135 - 145 mEq/L   Potassium 4.6 3.5 - 5.1 mEq/L   Chloride 102 96 - 112 mEq/L   CO2 22 19 - 32 mEq/L   Glucose, Bld 81 70 - 99 mg/dL   BUN 7 6 - 23 mg/dL   Creatinine, Ser 0.83 0.40 - 1.20 mg/dL   Total Bilirubin 0.5 0.2 - 1.2 mg/dL   Alkaline Phosphatase 60 39 - 117 U/L   AST 27 0 - 37 U/L   ALT 14 0 - 35 U/L   Total Protein 8.2 6.0 - 8.3 g/dL   Albumin 4.7 3.5 - 5.2 g/dL   GFR 98.05 >60.00 mL/min    Comment: Calculated using  the CKD-EPI Creatinine Equation (2021)   Calcium 9.9 8.4 - 10.5 mg/dL

## 2022-06-01 ENCOUNTER — Ambulatory Visit: Payer: 59 | Admitting: Internal Medicine

## 2022-06-01 ENCOUNTER — Encounter: Payer: Self-pay | Admitting: Internal Medicine

## 2022-06-01 VITALS — BP 142/98 | HR 90 | Temp 97.2°F | Ht 66.0 in | Wt 188.4 lb

## 2022-06-01 DIAGNOSIS — R110 Nausea: Secondary | ICD-10-CM

## 2022-06-01 DIAGNOSIS — R6889 Other general symptoms and signs: Secondary | ICD-10-CM | POA: Diagnosis not present

## 2022-06-01 LAB — POCT INFLUENZA A/B
Influenza A, POC: NEGATIVE
Influenza B, POC: NEGATIVE

## 2022-06-01 LAB — POC COVID19 BINAXNOW: SARS Coronavirus 2 Ag: NEGATIVE

## 2022-06-01 LAB — POCT URINE PREGNANCY: Preg Test, Ur: NEGATIVE

## 2022-06-01 MED ORDER — FEXOFENADINE-PSEUDOEPHED ER 60-120 MG PO TB12: 1.0000 | ORAL_TABLET | Freq: Two times a day (BID) | ORAL | 0 refills | Status: DC

## 2022-06-01 NOTE — Patient Instructions (Addendum)
It was a pleasure seeing you today! I truly hope you feel like you received 5 star service and please let me know if there is anything I can improve.  [name], MD   Today the plan is...take allegra day(s) and cerave for congestion and skin dryness. Ibuprofen for muscle pain.  Rest, take off work as per letter given  Nausea -     POCT urine pregnancy  Flu-like symptoms -     POC COVID-19 BinaxNow -     POCT Influenza A/B  Other orders -     Fexofenadine-Pseudoephed ER; Take 1 tablet by mouth 2 (two) times daily.  Dispense: 60 tablet; Refill: 0         [x]  RETURN TO CLINIC: No follow-ups on file.   - If you are not doing well: RETURN to the office sooner. - Please bring all your medicines to each appointment.  - If your condition begins to worsen or become severe:  GO to the ER.  [x]  QUESTIONS/CONCERNS:  If you have follow-up questions / concerns:  - CLINICAL: please contact me via phone [phone] OR MyChart messaging  - LAB & IMAGING RESULTS you will be contacted with the lab results as soon as they are available. For any labs or imaging tests, we will call you if the results are significantly abnormal.  Most normal results will be posted to myChart as soon as they are available and I will comment on them there within 2-3 business days.  The fastest way to get your results is to activate your My Chart account. Instructions are located on the last page of this paperwork. If you have not heard from regarding the results in 2 weeks, please contact this office.  - BILLING: xray and lab orders are billed from separate companies and questions./concerns should be directed to the invoicing company.  For visit charges please discuss with our administrative services

## 2022-06-01 NOTE — Progress Notes (Signed)
[place] at [employer]:  [phone]   Routine Medical Office Visit  Patient:  Natasha Pope [place]      Age: 26 y.o.       Sex:  female  Date:   06/01/2022  PCP:    Loralee Pacas, [place] Provider: Loralee Pacas, MD  Assessment/Plan:    Flu and COVID were negative as well as pregnancy test so it seems like some sort of other virus I will just treat symptomatically with Allegra-D and give her a work excuse she does not seem to have any significant vital sign abnormalities or clinical instability to indicate a need to go to the hospital but she does have quite a bit of eczema and dryness on her face around her nose lips and eyes where she has had a lot of nose blowing and eye watering and mouth breathing  [name] was seen today for fatigue, nasal congestion, burning eyes, body pain, nausea, chest feels heavy and scratchy throat.  Nausea -     POCT urine pregnancy  Flu-like symptoms -     POC COVID-19 BinaxNow -     POCT Influenza A/B  Other orders -     Fexofenadine-Pseudoephed ER; Take 1 tablet by mouth 2 (two) times daily.  Dispense: 60 tablet; Refill: 0    Today's key discussion points - also in After Visit Summary (AVS) Common side effects, risks, benefits, and alternatives for medications and treatment plan prescribed today were discussed, and she expressed understanding of the given instructions.  Medication list was reconciled and patient instructions and summary information was documented and made available for her to review in the AVS (see AVS).  This note is also available to patient for review for accuracy and understanding. She was encouraged to contact our office by phone or message via MyChart if she has any questions or concerns regarding our treatment plan (see AVS).  We discussed red flag symptoms and signs in detail and when to call the office or go to ER if her condition worsens (see AFTER VISIT SUMMARY). She expressed  understanding.  No barriers to understanding were identified     Subjective:   Natasha Pope is a 26 y.o. female with PMH significant for: Past Medical History:  Diagnosis Date   Anxiety    Asthma    Depression    Memory loss 04/03/2022   A/w alcohol use in past. 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   Technique we just overweight by BMI but she puts a lot of the weight on her abdomen so I am documenting this as obesity is at least truncal obesity therefore I am going to try to send in for [facility] and see if insurance will cover as not only would help weight loss but actually will reduce her desire to drink   PTSD (post-traumatic stress disorder) 03/19/2022   Pyelonephritis    Seizure-like activity ([place])    Seizures ([place])    Sepsis ([place])    Snoring 04/17/2022     She is presenting today with: Chief Complaint  Patient presents with   Fatigue    Extremely. All symptoms for about four days   Nasal Congestion    Slightly   Burning Eyes    Also watery.   Body pain    Slightly.  Nausea    Slightly, took prescription nausea medication this morning.   Chest feels heavy   Scratchy throat    Was sore before.     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 Negative for chills, diaphoresis and weight loss.  HENT:  Negative for congestion, ear discharge, ear pain, hearing loss, nosebleeds, sinus pain, sore throat and tinnitus.   Eyes:  Negative for blurred vision, double vision, photophobia, pain, discharge and redness.  Respiratory:  Negative for cough, hemoptysis, sputum production, shortness of breath, wheezing and stridor.   Cardiovascular:  Negative for chest pain, palpitations, orthopnea, claudication, leg swelling and PND.  Gastrointestinal:  Positive for heartburn and nausea. Negative for abdominal pain, blood in stool, constipation, diarrhea, melena and vomiting.  Genitourinary:  Negative for dysuria, flank pain, frequency, hematuria and urgency.  Musculoskeletal:  Positive for myalgias. Negative for back pain, falls, joint pain and neck pain.  Skin:  Negative for itching and rash.  Neurological:  Positive for dizziness (feels like normal). Negative for tingling, tremors, sensory change, speech change, focal weakness, seizures, loss of consciousness, weakness and headaches (not lately).  Endo/Heme/Allergies:  Negative for environmental allergies and polydipsia. Does not bruise/bleed easily.  Psychiatric/Behavioral:  Positive for depression. Negative for hallucinations, memory loss, substance abuse and suicidal ideas. The patient is nervous/anxious. The patient does not have insomnia.     Objective:  Physical Exam: BP (!) 142/98 (BP Location: Left Arm, Patient Position: Sitting)   Pulse 90   Temp (!) 97.2 F (36.2 C) (Temporal)   Ht _0  (1.676 m)   Wt 188 lb 6.4 oz (85.5 kg)   LMP 05/01/2022 (Approximate)   SpO2 95%   BMI 30.41 kg/m   She is a polite, friendly, and genuine person Constitutional: NAD, AAO, slightly ill-appearing  Neuro: alert, no focal deficit obvious, articulate speech Psych: normal mood, behavior, thought content  Problem-specific physical exam findings:   Results Reviewed:  No results found for any visits on 06/01/22.   Recent Results (from the past 2160 hour(s))  Comp Met (CMET)     Status: Abnormal   Collection Time: 03/07/22  4:27 PM  Result Value Ref Range   Sodium 140 135 - 145 mEq/L   Potassium 2.9 (L) 3.5 - 5.1 mEq/L   Chloride 103 96 - 112 mEq/L   CO2 24 19 - 32 mEq/L   Glucose, Bld 98 70 - 99 mg/dL   BUN 4 (L) 6 - 23 mg/dL   Creatinine, Ser 0.75 0.40 - 1.20 mg/dL   Total Bilirubin 0.3 0.2 - 1.2 mg/dL   Alkaline Phosphatase 53 39 - 117 U/L   AST 25 0 - 37 U/L   ALT 15 0 - 35 U/L   Total Protein 7.6 6.0 - 8.3 g/dL   Albumin 4.6 3.5 - 5.2 g/dL   GFR 110.76 >  60.00 mL/min    Comment: Calculated using the CKD-EPI Creatinine Equation (2021)   Calcium 8.7 8.4 - 10.5 mg/dL  CBC with Differential/Platelet     Status: Abnormal   Collection Time: 03/07/22  4:27 PM  Result Value Ref Range   WBC 6.4 4.0 - 10.5 K/uL   RBC 3.42 (L) 3.87 - 5.11 Mil/uL   Hemoglobin 11.9 (L) 12.0 - 15.0 g/dL   HCT 35.5 (L) 36.0 - 46.0 %   MCV 103.9 (H) 78.0 - 100.0 fl   MCHC 33.4 30.0 - 36.0 g/dL   RDW 26.1 (H) 11.5 - 15.5 %   Platelets 308.0 150.0 - 400.0 K/uL   Neutrophils Relative % 61.6 43.0 - 77.0 %   Lymphocytes Relative 30.6 12.0 - 46.0 %   Monocytes Relative 5.4 3.0 - 12.0 %   Eosinophils Relative 0.6 0.0 - 5.0 %   Basophils Relative 1.8 0.0 - 3.0 %   Neutro Abs 3.9 1.4 - 7.7 K/uL   Lymphs Abs 1.9 0.7 - 4.0 K/uL   Monocytes Absolute 0.3 0.1 - 1.0 K/uL   Eosinophils Absolute 0.0 0.0 - 0.7 K/uL   Basophils Absolute 0.1 0.0 - 0.1 K/uL  Lyme Disease Serology w/Reflex     Status: None   Collection Time: 03/07/22  4:27 PM  Result Value Ref Range   Lyme Total Antibody EIA Negative Negative    Comment: Lyme antibodies not detected. Reflex testing is not indicated. No laboratory evidence of infection with B. burgdorferi (Lyme disease). Negative results may occur in patients recently infected (less than or equal to 14 days) with B.  burgdorferi.  If recent infection is suspected, repeat testing on a new sample collected in 7 to 14 days is recommended.   CBC with Differential     Status: Abnormal   Collection Time: 03/08/22 12:07 PM  Result Value Ref Range   WBC 6.5 3.8 - 10.8 Thousand/uL   RBC 3.44 (L) 3.80 - 5.10 Million/uL   Hemoglobin 12.0 11.7 - 15.5 g/dL   HCT 33.8 (L) 35.0 - 45.0 %   MCV 98.3 80.0 - 100.0 fL   MCH 34.9 (H) 27.0 - 33.0 pg   MCHC 35.5 32.0 - 36.0 g/dL   RDW 22.9 (H) 11.0 - 15.0 %   Platelets 323 140 - 400 Thousand/uL   MPV 10.0 7.5 - 12.5 fL   Neutro Abs 4,069 1,500 - 7,800 cells/uL   Lymphs Abs 2,035 850 - 3,900 cells/uL   Absolute Monocytes 332 200 - 950 cells/uL   Eosinophils Absolute 33 15 - 500 cells/uL   Basophils Absolute 33 0 - 200 cells/uL   Neutrophils Relative % 62.6 %   Total Lymphocyte 31.3 %   Monocytes Relative 5.1 %   Eosinophils Relative 0.5 %   Basophils Relative 0.5 %  Comp Met (CMET)     Status: None   Collection Time: 03/19/22  3:32 PM  Result Value Ref Range   Sodium 139 135 - 145 mEq/L   Potassium 4.6 3.5 - 5.1 mEq/L   Chloride 102 96 - 112 mEq/L   CO2 22 19 - 32 mEq/L   Glucose, Bld 81 70 - 99 mg/dL   BUN 7 6 - 23 mg/dL   Creatinine, Ser 0.83 0.40 - 1.20 mg/dL   Total Bilirubin 0.5 0.2 - 1.2 mg/dL   Alkaline Phosphatase 60 39 - 117 U/L   AST 27 0 - 37 U/L   ALT 14 0 - 35 U/L   Total Protein  8.2 6.0 - 8.3 g/dL   Albumin 4.7 3.5 - 5.2 g/dL   GFR 98.05 >60.00 mL/min    Comment: Calculated using the CKD-EPI Creatinine Equation (2021)   Calcium 9.9 8.4 - 10.5 mg/dL    Results for orders placed or performed in visit on 06/01/22  POCT urine pregnancy  Result Value Ref Range   Preg Test, Ur Negative Negative  POC COVID-19  Result Value Ref Range   SARS Coronavirus 2 Ag Negative Negative  POCT Influenza A/B  Result Value Ref Range   Influenza A, POC Negative Negative   Influenza B, POC Negative Negative

## 2022-06-07 ENCOUNTER — Ambulatory Visit: Payer: 59 | Admitting: Behavioral Health

## 2022-06-13 ENCOUNTER — Ambulatory Visit: Payer: 59 | Admitting: Internal Medicine

## 2022-06-22 ENCOUNTER — Ambulatory Visit: Payer: 59 | Admitting: Internal Medicine

## 2022-06-26 ENCOUNTER — Emergency Department (HOSPITAL_COMMUNITY): Payer: 59

## 2022-06-26 ENCOUNTER — Inpatient Hospital Stay (HOSPITAL_COMMUNITY)
Admission: EM | Admit: 2022-06-26 | Discharge: 2022-06-29 | DRG: 896 | Disposition: A | Discharge status: Home / Self Care | Payer: 59 | Attending: Internal Medicine | Admitting: Internal Medicine

## 2022-06-26 ENCOUNTER — Other Ambulatory Visit: Payer: Self-pay

## 2022-06-26 DIAGNOSIS — Z87891 Personal history of nicotine dependence: Secondary | ICD-10-CM

## 2022-06-26 DIAGNOSIS — Z79899 Other long term (current) drug therapy: Secondary | ICD-10-CM

## 2022-06-26 DIAGNOSIS — Z888 Allergy status to other drugs, medicaments and biological substances status: Secondary | ICD-10-CM

## 2022-06-26 DIAGNOSIS — D7589 Other specified diseases of blood and blood-forming organs: Secondary | ICD-10-CM | POA: Diagnosis present

## 2022-06-26 DIAGNOSIS — F1010 Alcohol abuse, uncomplicated: Secondary | ICD-10-CM | POA: Diagnosis present

## 2022-06-26 DIAGNOSIS — Z818 Family history of other mental and behavioral disorders: Secondary | ICD-10-CM

## 2022-06-26 DIAGNOSIS — F418 Other specified anxiety disorders: Secondary | ICD-10-CM | POA: Diagnosis present

## 2022-06-26 DIAGNOSIS — G9340 Encephalopathy, unspecified: Secondary | ICD-10-CM

## 2022-06-26 DIAGNOSIS — R569 Unspecified convulsions: Secondary | ICD-10-CM | POA: Diagnosis not present

## 2022-06-26 DIAGNOSIS — R112 Nausea with vomiting, unspecified: Secondary | ICD-10-CM | POA: Diagnosis not present

## 2022-06-26 DIAGNOSIS — F10139 Alcohol abuse with withdrawal, unspecified: Principal | ICD-10-CM | POA: Diagnosis present

## 2022-06-26 DIAGNOSIS — F32A Depression, unspecified: Secondary | ICD-10-CM | POA: Diagnosis present

## 2022-06-26 DIAGNOSIS — F4310 Post-traumatic stress disorder, unspecified: Secondary | ICD-10-CM | POA: Diagnosis present

## 2022-06-26 DIAGNOSIS — J45909 Unspecified asthma, uncomplicated: Secondary | ICD-10-CM | POA: Diagnosis present

## 2022-06-26 DIAGNOSIS — E876 Hypokalemia: Secondary | ICD-10-CM | POA: Diagnosis not present

## 2022-06-26 DIAGNOSIS — G9341 Metabolic encephalopathy: Secondary | ICD-10-CM | POA: Diagnosis present

## 2022-06-26 DIAGNOSIS — Y900 Blood alcohol level of less than 20 mg/100 ml: Secondary | ICD-10-CM | POA: Diagnosis present

## 2022-06-26 DIAGNOSIS — R197 Diarrhea, unspecified: Secondary | ICD-10-CM

## 2022-06-26 DIAGNOSIS — Z8249 Family history of ischemic heart disease and other diseases of the circulatory system: Secondary | ICD-10-CM

## 2022-06-26 LAB — COMPREHENSIVE METABOLIC PANEL
ALT: 30 U/L (ref 0–44)
AST: 65 U/L — ABNORMAL HIGH (ref 15–41)
Albumin: 4.3 g/dL (ref 3.5–5.0)
Alkaline Phosphatase: 86 U/L (ref 38–126)
Anion gap: 18 — ABNORMAL HIGH (ref 5–15)
BUN: 6 mg/dL (ref 6–20)
CO2: 24 mmol/L (ref 22–32)
Calcium: 9 mg/dL (ref 8.9–10.3)
Chloride: 94 mmol/L — ABNORMAL LOW (ref 98–111)
Creatinine, Ser: 0.77 mg/dL (ref 0.44–1.00)
GFR, Estimated: >60 mL/min (ref >60)
Glucose, Bld: 164 mg/dL — ABNORMAL HIGH (ref 70–99)
Potassium: 2.9 mmol/L — ABNORMAL LOW (ref 3.5–5.1)
Sodium: 136 mmol/L (ref 135–145)
Total Bilirubin: 1.5 mg/dL — ABNORMAL HIGH (ref 0.3–1.2)
Total Protein: 7.3 g/dL (ref 6.5–8.1)

## 2022-06-26 LAB — CBC
HCT: 36.6 % (ref 36.0–46.0)
Hemoglobin: 12.8 g/dL (ref 12.0–15.0)
MCH: 36.3 pg — ABNORMAL HIGH (ref 26.0–34.0)
MCHC: 35 g/dL (ref 30.0–36.0)
MCV: 103.7 fL — ABNORMAL HIGH (ref 80.0–100.0)
Platelets: 203 10*3/uL (ref 150–400)
RBC: 3.53 MIL/uL — ABNORMAL LOW (ref 3.87–5.11)
RDW: 17.6 % — ABNORMAL HIGH (ref 11.5–15.5)
WBC: 7 10*3/uL (ref 4.0–10.5)
nRBC: 0.6 % — ABNORMAL HIGH (ref 0.0–0.2)

## 2022-06-26 LAB — ETHANOL: Alcohol, Ethyl (B): <10 mg/dL (ref <10)

## 2022-06-26 LAB — LACTIC ACID, PLASMA: Lactic Acid, Venous: 1.5 mmol/L (ref 0.5–1.9)

## 2022-06-26 MED ORDER — ONDANSETRON HCL 4 MG/2ML IJ SOLN
4.0000 mg | Freq: Once | INTRAMUSCULAR | Status: AC
  Administered 2022-06-26: 4 mg via INTRAVENOUS
  Filled 2022-06-26: qty 2

## 2022-06-26 MED ORDER — LORAZEPAM 2 MG/ML IJ SOLN
1.0000 mg | Freq: Once | INTRAMUSCULAR | Status: AC
  Administered 2022-06-26: 1 mg via INTRAVENOUS
  Filled 2022-06-26: qty 1

## 2022-06-26 MED ORDER — ONDANSETRON 4 MG PO TBDP
4.0000 mg | ORAL_TABLET | Freq: Once | ORAL | Status: AC
  Administered 2022-06-26: 4 mg via ORAL
  Filled 2022-06-26: qty 1

## 2022-06-26 NOTE — ED Notes (Signed)
Patient transported to CT 

## 2022-06-26 NOTE — ED Notes (Signed)
Ct called at this time

## 2022-06-26 NOTE — ED Provider Notes (Incomplete)
[facility] EMERGENCY DEPARTMENT Provider Note   CSN: [id number] Arrival date & time: 06/26/22  1819     History {Add pertinent medical, surgical, social history, OB history to HPI:1} Chief Complaint  Patient presents with  . Seizures    Natasha Pope is a 26 y.o. female with a past medical history of alcohol use disorder, asthma, seizure, PTSD brought in by [facility] to the emergency department after having a seizure.  Patient was found to be in postictal phase in a hotel where she lives by EMS.  The seizure was unwitnessed.  Patient cannot recall if she have any dizziness, vision changes before the episode. Patient states she had a seizure in the past.  Can remember the last episode.  Patient is not on any antiseizure medications.  Patient arrived with a c-collar in place.  Patient reports vomiting throughout today.  Denies headache, dizziness, chest pain, shortness of breath, bowel changes, urinary symptoms.   Seizures     Past Medical History:  Diagnosis Date  . Anxiety   . Asthma   . Depression   . 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 Suspect due to vitamin deficiency Recommended she take daily mvi and continue to abstain from EtOH  . 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   Technique we just overweight by BMI but she puts a lot of the weight on her abdomen so I am documenting this as obesity is at least truncal obesity therefore I am going to try to send in for [facility] and see if insurance will cover as not only would help weight loss but actually will reduce her desire to drink  . PTSD (post-traumatic stress disorder) 03/19/2022  . Pyelonephritis   . Seizure-like activity ([facility])   . Seizures (HCC)   . Sepsis ([facility])   . Snoring 04/17/2022   Past Surgical History:  Procedure Laterality Date  . dental procedure       Home Medications Prior to Admission medications   Medication Sig Start Date End  Date Taking? Authorizing Provider  acamprosate (CAMPRAL) 333 MG tablet Take 2 tablets (666 mg total) by mouth 3 (three) times daily with meals. 03/20/22   [name], MD  AUVELITY 45-105 MG TBCR TAKE ONE (1) TABLET BY MOUTH TWICE A DAY 05/14/22   White, Watt Climes, NP  busPIRone (BUSPAR) 15 MG tablet Take 1 tablet (15 mg total) by mouth 3 (three) times daily. 12/27/21   Joan Flores, NP  DULoxetine (CYMBALTA) 30 MG capsule TAKE ONE CAPSULE BY MOUTH TWICE DAILY 05/14/22   White, Watt Climes, NP  fexofenadine-pseudoephedrine (ALLEGRA-D ALLERGY & CONGESTION) 60-120 MG 12 hr tablet Take 1 tablet by mouth 2 (two) times daily. 06/01/22   [name], MD  gabapentin (NEURONTIN) 100 MG capsule Take 1 capsule (100 mg total) by mouth 3 (three) times daily. Use instead of the 300 mg dose to try to reduce total gabapentin usage and taper it down but only as tolerated if you feel you are getting anxiety or tremors okay to go back up on the dose a little 05/18/22   [name], MD  gabapentin (NEURONTIN) 300 MG capsule Take 1 capsule (300 mg total) by mouth 3 (three) times daily. 04/25/22   [name], MD  methocarbamol (ROBAXIN) 500 MG tablet Take 1 tablet (500 mg total) by mouth 4 (four) times daily. 03/19/22   [name],  MD  naltrexone (DEPADE) 50 MG tablet Take 1 tablet (50 mg total) by mouth daily. 12/27/21   Joan Flores, NP  norethindrone-ethinyl estradiol-FE (HAILEY FE 1/20) 1-20 MG-MCG tablet Take 1 tablet by mouth daily. 03/19/22   [name], MD  propranolol (INDERAL) 20 MG tablet TAKE ONE TABLET BY MOUTH THREE TIMES DAILY 05/14/22   Joan Flores, NP  QUEtiapine (SEROQUEL) 50 MG tablet TAKE ONE TABLET BY MOUTH DAILY AT BEDTIME 05/14/22   White, Watt Climes, NP  rizatriptan (MAXALT) 10 MG tablet TAKE ONE TABLET BY MOUTH AS NEEDED *may repeat in 2 hours if needed* 08/28/21   Janeece Agee, NP  traZODone (DESYREL) 100 MG tablet TAKE ONE TABLET BY MOUTH DAILY AT BEDTIME 04/26/22   Joan Flores, NP      Allergies    Lamotrigine    Review of Systems   Review of Systems  Neurological:  Positive for seizures.    Physical Exam Updated Vital Signs BP (!) 150/106   Pulse 86   Temp 98.3 F (36.8 C)   Resp 17   Ht 5\' 6"  (1.676 m)   Wt 68 kg   SpO2 92%   BMI 24.21 kg/m  Physical Exam Vitals and nursing note reviewed.  Constitutional:      Appearance: Normal appearance.  HENT:     Head: Normocephalic and atraumatic.     Mouth/Throat:     Mouth: Mucous membranes are moist.  Eyes:     General: No scleral icterus. Neck:     Comments: C-collar in place. Cardiovascular:     Rate and Rhythm: Normal rate and regular rhythm.     Pulses: Normal pulses.     Heart sounds: Normal heart sounds.  Pulmonary:     Effort: Pulmonary effort is normal.     Breath sounds: Normal breath sounds.  Abdominal:     General: Abdomen is flat.     Palpations: Abdomen is soft.     Tenderness: There is no abdominal tenderness.  Musculoskeletal:        General: No deformity.  Skin:    General: Skin is warm.     Findings: No rash.  Neurological:     General: No focal deficit present.     Mental Status: She is alert.     Comments: Cranial nerves II through XII intact. Intact sensation to light touch in all 4 extremities. 5/5 strength in all 4 extremities. Intact finger-to-nose and heel-to-shin of all 4 extremities. No visual field cuts. No neglect noted. No aphasia noted.    Psychiatric:        Mood and Affect: Mood normal.     ED Results / Procedures / Treatments   Labs (all labs ordered are listed, but only abnormal results are displayed) Labs Reviewed  CBC  COMPREHENSIVE METABOLIC PANEL  URINALYSIS, ROUTINE W REFLEX MICROSCOPIC  PREGNANCY, URINE    EKG None  Radiology No results found.  Procedures Procedures  {Document cardiac monitor, telemetry assessment procedure when appropriate:1}  Medications Ordered in ED Medications - No data to display  ED Course/  Medical Decision Making/ A&P                           Medical Decision Making Amount and/or Complexity of Data Reviewed Labs: ordered. Radiology: ordered.  Risk Prescription drug management.   This patient presents to the ED for seizure, this involves an extensive number of treatment options, and  is a complaint that carries with a high risk of complications and morbidity.  The differential diagnosis includes epilepsy, CVA, syncope, withdrawal, infectious etiology.  This is not an exhaustive list.  Comorbidities that complicate the patient evaluation See HPI  Social determinants of health NA  Additional history obtained: External records from outside source obtained and reviewed including: Chart review including previous notes, labs, imaging.  Cardiac monitoring/EKG: The patient was maintained on a cardiac monitor.  I personally reviewed and interpreted the cardiac monitor which showed an underlying rhythm of: Sinus rhythm.  Lab tests: I ordered and personally interpreted labs.  The pertinent results include: WBC unremarkable. Hbg unremarkable. Platelets unremarkable. No electrolyte abnormalities noted. BUN, creatinine unremarkable. LFT unremarkable. UA significant for no acute abnormality. ***  Imaging studies: I ordered imaging studies including: *** showed ***.  I personally reviewed, interpreted imaging and agree with the radiologist's interpretations.  Problem list/ ED course/ Critical interventions/ Medical management: HPI: See above Vital signs ***otherwise within normal range and stable throughout visit. Laboratory/imaging studies significant for: See above. On physical examination, patient is afebrile and appears in no acute distress. ***.  Patient's clinical presentations and laboratory/imaging studies are most concerned for ***. *** ordered. Reevaluation of the patient after these medications showed that the patient {resolved/improved/worsened:23923::"improved"}.  Advised patient to ***. I have reviewed the patient home medicines and have made adjustments as needed.  Consultations obtained: I requested consultation with Dr. ***, and discussed lab and imaging findings as well as pertinent plan.  He/she agrees with the plan ***.  Disposition Continued outpatient therapy. Follow-up with PCP *** recommended for reevaluation of symptoms. Treatment plan discussed with patient.  Pt acknowledged understanding was agreeable to the plan. Worrisome signs and symptoms were discussed with patient, and patient acknowledged understanding to return to the ED if they noticed these signs and symptoms. Patient was stable upon discharge.   This chart was dictated using voice recognition software.  Despite best efforts to proofread,  errors can occur which can change the documentation meaning.    {Document critical care time when appropriate:1} {Document review of labs and clinical decision tools ie heart score, Chads2Vasc2 etc:1}  {Document your independent review of radiology images, and any outside records:1} {Document your discussion with family members, caretakers, and with consultants:1} {Document social determinants of health affecting pt's care:1} {Document your decision making why or why not admission, treatments were needed:1} Final Clinical Impression(s) / ED Diagnoses Final diagnoses:  None    Rx / DC Orders ED Discharge Orders     None

## 2022-06-26 NOTE — ED Triage Notes (Signed)
Pt BIB EMS due to a seizure. Pt lives in hotel. Pt was found in hallway, ems FOUND PT TO BE POST- ICTAL. Seizure not witnessed.Pt states she thinks she has had seizure in past. Pt is axox3 on arrival. HTN with EMS. Pt has t-wave inversions. Pt has been vomitting/ diarrhea all day.

## 2022-06-26 NOTE — ED Provider Notes (Signed)
[facility] EMERGENCY DEPARTMENT Provider Note   CSN: [id number] Arrival date & time: 06/26/22  1819     History {Add pertinent medical, surgical, social history, OB history to HPI:1} Chief Complaint  Patient presents with  . Seizures    Natasha Pope is a 26 y.o. female with a past medical history of asthma, seizure, PTSD brought in by [facility] to the emergency department after having a seizure.  Patient was found to be in postictal phase in a hotel where she lives by EMS.  The seizure was unwitnessed.  Patient cannot recall if she have any dizziness, vision changes before the episode. Patient states she had a seizure in the past.  Can remember the last episode.  Patient is not on any antiseizure medications.  Patient arrived with a c-collar in place.  Patient reports vomiting throughout today.  Denies headache, dizziness, chest pain, shortness of breath, bowel changes, urinary symptoms.   Seizures     Past Medical History:  Diagnosis Date  . Anxiety   . Asthma   . Depression   . 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 Suspect due to vitamin deficiency Recommended she take daily mvi and continue to abstain from EtOH  . 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   Technique we just overweight by BMI but she puts a lot of the weight on her abdomen so I am documenting this as obesity is at least truncal obesity therefore I am going to try to send in for [facility] and see if insurance will cover as not only would help weight loss but actually will reduce her desire to drink  . PTSD (post-traumatic stress disorder) 03/19/2022  . Pyelonephritis   . Seizure-like activity (HCC)   . Seizures (HCC)   . Sepsis (HCC)   . Snoring 04/17/2022   Past Surgical History:  Procedure Laterality Date  . dental procedure       Home Medications Prior to Admission medications   Medication Sig Start Date End Date Taking? Authorizing  Provider  acamprosate (CAMPRAL) 333 MG tablet Take 2 tablets (666 mg total) by mouth 3 (three) times daily with meals. 03/20/22   [name], MD  AUVELITY 45-105 MG TBCR TAKE ONE (1) TABLET BY MOUTH TWICE A DAY 05/14/22   White, Watt Climes, NP  busPIRone (BUSPAR) 15 MG tablet Take 1 tablet (15 mg total) by mouth 3 (three) times daily. 12/27/21   Joan Flores, NP  DULoxetine (CYMBALTA) 30 MG capsule TAKE ONE CAPSULE BY MOUTH TWICE DAILY 05/14/22   White, Watt Climes, NP  fexofenadine-pseudoephedrine (ALLEGRA-D ALLERGY & CONGESTION) 60-120 MG 12 hr tablet Take 1 tablet by mouth 2 (two) times daily. 06/01/22   [name], MD  gabapentin (NEURONTIN) 100 MG capsule Take 1 capsule (100 mg total) by mouth 3 (three) times daily. Use instead of the 300 mg dose to try to reduce total gabapentin usage and taper it down but only as tolerated if you feel you are getting anxiety or tremors okay to go back up on the dose a little 05/18/22   [name], MD  gabapentin (NEURONTIN) 300 MG capsule Take 1 capsule (300 mg total) by mouth 3 (three) times daily. 04/25/22   [name], MD  methocarbamol (ROBAXIN) 500 MG tablet Take 1 tablet (500 mg total) by mouth 4 (four) times daily. 03/19/22   [name], MD  naltrexone (  DEPADE) 50 MG tablet Take 1 tablet (50 mg total) by mouth daily. 12/27/21   Joan Flores, NP  norethindrone-ethinyl estradiol-FE (HAILEY FE 1/20) 1-20 MG-MCG tablet Take 1 tablet by mouth daily. 03/19/22   [name], MD  propranolol (INDERAL) 20 MG tablet TAKE ONE TABLET BY MOUTH THREE TIMES DAILY 05/14/22   Joan Flores, NP  QUEtiapine (SEROQUEL) 50 MG tablet TAKE ONE TABLET BY MOUTH DAILY AT BEDTIME 05/14/22   White, Watt Climes, NP  rizatriptan (MAXALT) 10 MG tablet TAKE ONE TABLET BY MOUTH AS NEEDED *may repeat in 2 hours if needed* 08/28/21   Janeece Agee, NP  traZODone (DESYREL) 100 MG tablet TAKE ONE TABLET BY MOUTH DAILY AT BEDTIME 04/26/22   Joan Flores, NP       Allergies    Lamotrigine    Review of Systems   Review of Systems  Neurological:  Positive for seizures.    Physical Exam Updated Vital Signs BP (!) 150/106   Pulse 86   Temp 98.3 F (36.8 C)   Resp 17   Ht 5\' 6"  (1.676 m)   Wt 68 kg   SpO2 92%   BMI 24.21 kg/m  Physical Exam Vitals and nursing note reviewed.  Constitutional:      Appearance: Normal appearance.  HENT:     Head: Normocephalic and atraumatic.     Mouth/Throat:     Mouth: Mucous membranes are moist.  Eyes:     General: No scleral icterus. Neck:     Comments: C-collar in place. Cardiovascular:     Rate and Rhythm: Normal rate and regular rhythm.     Pulses: Normal pulses.     Heart sounds: Normal heart sounds.  Pulmonary:     Effort: Pulmonary effort is normal.     Breath sounds: Normal breath sounds.  Abdominal:     General: Abdomen is flat.     Palpations: Abdomen is soft.     Tenderness: There is no abdominal tenderness.  Musculoskeletal:        General: No deformity.  Skin:    General: Skin is warm.     Findings: No rash.  Neurological:     General: No focal deficit present.     Mental Status: She is alert.     Comments: Cranial nerves II through XII intact. Intact sensation to light touch in all 4 extremities. 5/5 strength in all 4 extremities. Intact finger-to-nose and heel-to-shin of all 4 extremities. No visual field cuts. No neglect noted. No aphasia noted.    Psychiatric:        Mood and Affect: Mood normal.    ED Results / Procedures / Treatments   Labs (all labs ordered are listed, but only abnormal results are displayed) Labs Reviewed  CBC  COMPREHENSIVE METABOLIC PANEL  URINALYSIS, ROUTINE W REFLEX MICROSCOPIC  PREGNANCY, URINE    EKG None  Radiology No results found.  Procedures Procedures  {Document cardiac monitor, telemetry assessment procedure when appropriate:1}  Medications Ordered in ED Medications - No data to display  ED Course/ Medical Decision  Making/ A&P                           Medical Decision Making Amount and/or Complexity of Data Reviewed Labs: ordered. Radiology: ordered.   This patient presents to the ED for seizure, this involves an extensive number of treatment options, and is a complaint that carries with a high risk  of complications and morbidity.  The differential diagnosis includes epilepsy, CVA, syncope, withdrawal, infectious etiology.  This is not an exhaustive list.  Comorbidities that complicate the patient evaluation See HPI  Social determinants of health NA  Additional history obtained: External records from outside source obtained and reviewed including: Chart review including previous notes, labs, imaging.  Cardiac monitoring/EKG: The patient was maintained on a cardiac monitor.  I personally reviewed and interpreted the cardiac monitor which showed an underlying rhythm of: Sinus rhythm.  Lab tests: I ordered and personally interpreted labs.  The pertinent results include: WBC unremarkable. Hbg unremarkable. Platelets unremarkable. No electrolyte abnormalities noted. BUN, creatinine unremarkable. LFT unremarkable. UA significant for no acute abnormality. ***  Imaging studies: I ordered imaging studies including: *** showed ***.  I personally reviewed, interpreted imaging and agree with the radiologist's interpretations.  Problem list/ ED course/ Critical interventions/ Medical management: HPI: See above Vital signs ***otherwise within normal range and stable throughout visit. Laboratory/imaging studies significant for: See above. On physical examination, patient is afebrile and appears in no acute distress. ***.  Patient's clinical presentations and laboratory/imaging studies are most concerned for ***. *** ordered. Reevaluation of the patient after these medications showed that the patient {resolved/improved/worsened:23923::"improved"}. Advised patient to ***. I have reviewed the patient home  medicines and have made adjustments as needed.  Consultations obtained: I requested consultation with Dr. ***, and discussed lab and imaging findings as well as pertinent plan.  He/she agrees with the plan ***.  Disposition Continued outpatient therapy. Follow-up with PCP *** recommended for reevaluation of symptoms. Treatment plan discussed with patient.  Pt acknowledged understanding was agreeable to the plan. Worrisome signs and symptoms were discussed with patient, and patient acknowledged understanding to return to the ED if they noticed these signs and symptoms. Patient was stable upon discharge.   This chart was dictated using voice recognition software.  Despite best efforts to proofread,  errors can occur which can change the documentation meaning.    {Document critical care time when appropriate:1} {Document review of labs and clinical decision tools ie heart score, Chads2Vasc2 etc:1}  {Document your independent review of radiology images, and any outside records:1} {Document your discussion with family members, caretakers, and with consultants:1} {Document social determinants of health affecting pt's care:1} {Document your decision making why or why not admission, treatments were needed:1} Final Clinical Impression(s) / ED Diagnoses Final diagnoses:  None    Rx / DC Orders ED Discharge Orders     None

## 2022-06-26 NOTE — ED Notes (Signed)
Pt unable to sit still for CT. Ativan ordered

## 2022-06-27 ENCOUNTER — Ambulatory Visit: Payer: 59 | Admitting: Internal Medicine

## 2022-06-27 ENCOUNTER — Encounter (HOSPITAL_COMMUNITY): Payer: Self-pay | Admitting: Family Medicine

## 2022-06-27 ENCOUNTER — Observation Stay (HOSPITAL_COMMUNITY): Payer: 59

## 2022-06-27 ENCOUNTER — Telehealth: Payer: Self-pay | Admitting: Internal Medicine

## 2022-06-27 DIAGNOSIS — R112 Nausea with vomiting, unspecified: Secondary | ICD-10-CM | POA: Diagnosis present

## 2022-06-27 DIAGNOSIS — G9340 Encephalopathy, unspecified: Secondary | ICD-10-CM | POA: Diagnosis present

## 2022-06-27 DIAGNOSIS — F418 Other specified anxiety disorders: Secondary | ICD-10-CM | POA: Diagnosis present

## 2022-06-27 HISTORY — DX: Nausea with vomiting, unspecified: R11.2

## 2022-06-27 LAB — COMPREHENSIVE METABOLIC PANEL
ALT: 25 U/L (ref 0–44)
AST: 46 U/L — ABNORMAL HIGH (ref 15–41)
Albumin: 4.2 g/dL (ref 3.5–5.0)
Alkaline Phosphatase: 76 U/L (ref 38–126)
Anion gap: 15 (ref 5–15)
BUN: 6 mg/dL (ref 6–20)
CO2: 26 mmol/L (ref 22–32)
Calcium: 8.7 mg/dL — ABNORMAL LOW (ref 8.9–10.3)
Chloride: 94 mmol/L — ABNORMAL LOW (ref 98–111)
Creatinine, Ser: 0.77 mg/dL (ref 0.44–1.00)
GFR, Estimated: >60 mL/min (ref >60)
Glucose, Bld: 118 mg/dL — ABNORMAL HIGH (ref 70–99)
Potassium: 3.3 mmol/L — ABNORMAL LOW (ref 3.5–5.1)
Sodium: 135 mmol/L (ref 135–145)
Total Bilirubin: 1.3 mg/dL — ABNORMAL HIGH (ref 0.3–1.2)
Total Protein: 6.7 g/dL (ref 6.5–8.1)

## 2022-06-27 LAB — URINALYSIS, ROUTINE W REFLEX MICROSCOPIC
Bacteria, UA: NONE SEEN
Glucose, UA: NEGATIVE mg/dL
Hgb urine dipstick: NEGATIVE
Ketones, ur: 20 mg/dL — AB
Leukocytes,Ua: NEGATIVE
Nitrite: NEGATIVE
Protein, ur: 100 mg/dL — AB
Specific Gravity, Urine: 1.024 (ref 1.005–1.030)
pH: 7 (ref 5.0–8.0)

## 2022-06-27 LAB — HIV ANTIBODY (ROUTINE TESTING W REFLEX): HIV Screen 4th Generation wRfx: NONREACTIVE

## 2022-06-27 LAB — RAPID URINE DRUG SCREEN, HOSP PERFORMED
Amphetamines: NOT DETECTED
Barbiturates: NOT DETECTED
Benzodiazepines: POSITIVE — AB
Cocaine: NOT DETECTED
Opiates: NOT DETECTED
Tetrahydrocannabinol: POSITIVE — AB

## 2022-06-27 LAB — CBC
HCT: 34 % — ABNORMAL LOW (ref 36.0–46.0)
Hemoglobin: 11.8 g/dL — ABNORMAL LOW (ref 12.0–15.0)
MCH: 36.1 pg — ABNORMAL HIGH (ref 26.0–34.0)
MCHC: 34.7 g/dL (ref 30.0–36.0)
MCV: 104 fL — ABNORMAL HIGH (ref 80.0–100.0)
Platelets: 186 10*3/uL (ref 150–400)
RBC: 3.27 MIL/uL — ABNORMAL LOW (ref 3.87–5.11)
RDW: 17.8 % — ABNORMAL HIGH (ref 11.5–15.5)
WBC: 7.2 10*3/uL (ref 4.0–10.5)
nRBC: 0.8 % — ABNORMAL HIGH (ref 0.0–0.2)

## 2022-06-27 LAB — PHOSPHORUS: Phosphorus: 3.2 mg/dL (ref 2.5–4.6)

## 2022-06-27 LAB — MAGNESIUM: Magnesium: 1.5 mg/dL — ABNORMAL LOW (ref 1.7–2.4)

## 2022-06-27 LAB — PREGNANCY, URINE: Preg Test, Ur: NEGATIVE

## 2022-06-27 MED ORDER — MAGNESIUM SULFATE 2 GM/50ML IV SOLN
2.0000 g | Freq: Once | INTRAVENOUS | Status: AC
  Administered 2022-06-27: 2 g via INTRAVENOUS
  Filled 2022-06-27: qty 50

## 2022-06-27 MED ORDER — ACETAMINOPHEN 325 MG PO TABS
650.0000 mg | ORAL_TABLET | Freq: Four times a day (QID) | ORAL | Status: DC | PRN
  Administered 2022-06-27 – 2022-06-29 (×3): 650 mg via ORAL
  Filled 2022-06-27 (×3): qty 2

## 2022-06-27 MED ORDER — LORAZEPAM 1 MG PO TABS
1.0000 mg | ORAL_TABLET | ORAL | Status: DC | PRN
  Administered 2022-06-28: 1 mg via ORAL
  Filled 2022-06-27: qty 1

## 2022-06-27 MED ORDER — FOLIC ACID 1 MG PO TABS
1.0000 mg | ORAL_TABLET | Freq: Every day | ORAL | Status: DC
  Administered 2022-06-27 – 2022-06-28 (×2): 1 mg via ORAL
  Filled 2022-06-27 (×2): qty 1

## 2022-06-27 MED ORDER — ACETAMINOPHEN 650 MG RE SUPP: 650.0000 mg | Freq: Four times a day (QID) | RECTAL | Status: DC | PRN

## 2022-06-27 MED ORDER — POTASSIUM CHLORIDE 10 MEQ/100ML IV SOLN
10.0000 meq | INTRAVENOUS | Status: AC
  Administered 2022-06-27 (×2): 10 meq via INTRAVENOUS
  Filled 2022-06-27 (×2): qty 100

## 2022-06-27 MED ORDER — ENOXAPARIN SODIUM 40 MG/0.4ML IJ SOSY
40.0000 mg | PREFILLED_SYRINGE | INTRAMUSCULAR | Status: DC
  Administered 2022-06-27 – 2022-06-28 (×2): 40 mg via SUBCUTANEOUS
  Filled 2022-06-27 (×2): qty 0.4

## 2022-06-27 MED ORDER — LACTATED RINGERS IV SOLN: INTRAVENOUS | Status: AC

## 2022-06-27 MED ORDER — LORAZEPAM 2 MG/ML IJ SOLN
1.0000 mg | INTRAMUSCULAR | Status: DC | PRN
  Administered 2022-06-28: 2 mg via INTRAVENOUS
  Filled 2022-06-27: qty 1

## 2022-06-27 MED ORDER — POTASSIUM CHLORIDE CRYS ER 20 MEQ PO TBCR
40.0000 meq | EXTENDED_RELEASE_TABLET | Freq: Once | ORAL | Status: AC
  Administered 2022-06-27: 40 meq via ORAL
  Filled 2022-06-27: qty 2

## 2022-06-27 MED ORDER — OXYCODONE HCL 5 MG PO TABS
5.0000 mg | ORAL_TABLET | ORAL | Status: DC | PRN
  Administered 2022-06-28: 5 mg via ORAL
  Filled 2022-06-27: qty 1

## 2022-06-27 MED ORDER — THIAMINE MONONITRATE 100 MG PO TABS
100.0000 mg | ORAL_TABLET | Freq: Every day | ORAL | Status: DC
  Administered 2022-06-27 – 2022-06-28 (×2): 100 mg via ORAL
  Filled 2022-06-27 (×2): qty 1

## 2022-06-27 MED ORDER — ONDANSETRON HCL 4 MG/2ML IJ SOLN
INTRAMUSCULAR | Status: AC
  Administered 2022-06-27: 4 mg via INTRAVENOUS
  Filled 2022-06-27: qty 2

## 2022-06-27 MED ORDER — TRAZODONE HCL 50 MG PO TABS
100.0000 mg | ORAL_TABLET | Freq: Every day | ORAL | Status: DC
  Administered 2022-06-27 – 2022-06-28 (×2): 100 mg via ORAL
  Filled 2022-06-27 (×2): qty 2

## 2022-06-27 MED ORDER — LORAZEPAM 2 MG/ML IJ SOLN
0.0000 mg | Freq: Four times a day (QID) | INTRAMUSCULAR | Status: DC
  Administered 2022-06-28: 1 mg via INTRAVENOUS
  Administered 2022-06-28: 2 mg via INTRAVENOUS
  Filled 2022-06-27 (×2): qty 1

## 2022-06-27 MED ORDER — QUETIAPINE FUMARATE 25 MG PO TABS
50.0000 mg | ORAL_TABLET | Freq: Every day | ORAL | Status: DC
  Administered 2022-06-27 – 2022-06-28 (×2): 50 mg via ORAL
  Filled 2022-06-27 (×2): qty 2

## 2022-06-27 MED ORDER — ONDANSETRON HCL 4 MG/2ML IJ SOLN: 4.0000 mg | Freq: Four times a day (QID) | INTRAMUSCULAR | Status: DC | PRN

## 2022-06-27 MED ORDER — MAGNESIUM SULFATE IN D5W 1-5 GM/100ML-% IV SOLN
1.0000 g | Freq: Once | INTRAVENOUS | Status: AC
  Administered 2022-06-27: 1 g via INTRAVENOUS
  Filled 2022-06-27: qty 100

## 2022-06-27 MED ORDER — LORAZEPAM 2 MG/ML IJ SOLN
0.0000 mg | Freq: Two times a day (BID) | INTRAMUSCULAR | Status: DC
  Administered 2022-06-29: 2 mg via INTRAVENOUS
  Filled 2022-06-27: qty 1

## 2022-06-27 MED ORDER — ADULT MULTIVITAMIN W/MINERALS CH
1.0000 | ORAL_TABLET | Freq: Every day | ORAL | Status: DC
  Administered 2022-06-27 – 2022-06-28 (×2): 1 via ORAL
  Filled 2022-06-27 (×2): qty 1

## 2022-06-27 MED ORDER — THIAMINE HCL 100 MG/ML IJ SOLN: 100.0000 mg | Freq: Every day | INTRAMUSCULAR | Status: DC

## 2022-06-27 MED ORDER — SODIUM CHLORIDE 0.9% FLUSH
3.0000 mL | Freq: Two times a day (BID) | INTRAVENOUS | Status: DC
  Administered 2022-06-27 – 2022-06-28 (×5): 3 mL via INTRAVENOUS

## 2022-06-27 NOTE — H&P (Signed)
History and Physical    Natasha Pope [place] KWI:[id number] DOB: [birth year] DOA: 06/26/2022  PCP: [name], MD   Patient coming from: Home   Chief Complaint: Found on floor confused  HPI: Natasha Pope is a 26 y.o. female with medical history significant for depression, anxiety, drug and alcohol abuse, and seizures who presents after she was found confused and diaphoretic.  Patient reports that she has been trying to abstain from alcohol but has had difficulty with this.  She last drank 3 days ago when she had several drinks.  She then developed nausea with vomiting and loose stools yesterday which have persisted.  She does not remember what happened earlier today but was found laying in the hallway of the hotel she lives at.  Per EMS report, she was confused and diaphoretic but vital signs were normal, abdomen nontender, and she was able to stand on her own.  She was treated with Zofran prior to arrival in the ED.  She reports that she was taken off of her seizure medication last summer and has not been taking any other medications regularly.  She denies abdominal pain, fever, chills, headache, or focal numbness or weakness.  ED Course: Upon arrival to the ED, patient is found to be afebrile and saturating low 90s with normal heart rate and elevated blood pressure.  Head CT and cervical spine CT are negative for acute abnormalities.  Blood work notable for macrocytosis without anemia, potassium 2.9, AST 65, and total bilirubin 1.5.  Patient was treated with 1 mg IV Ativan and Zofran x 2 in the ED.  Review of Systems:  All other systems reviewed and apart from HPI, are negative.  Past Medical History:  Diagnosis Date   Anxiety    Asthma    Depression    Memory loss 04/03/2022   A/w alcohol use in past. Suspect due to vitamin deficiency Recommended she take daily mvi and continue to abstain from EtOH   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 04/17/2022   Body mass index is  29.44 kg/m.   Technique we just overweight by BMI but she puts a lot of the weight on her abdomen so I am documenting this as obesity is at least truncal obesity therefore I am going to try to send in for [facility] and see if insurance will cover as not only would help weight loss but actually will reduce her desire to drink   PTSD (post-traumatic stress disorder) 03/19/2022   Pyelonephritis    Seizure-like activity (HCC)    Seizures (HCC)    Sepsis (HCC)    Snoring 04/17/2022    Past Surgical History:  Procedure Laterality Date   dental procedure      Social History:   reports that she quit smoking about 4 years ago. Her smoking use included cigarettes. She smoked an average of .25 packs per day. She has never used smokeless tobacco. She reports current alcohol use. She reports that she does not currently use drugs after having used the following drugs: Marijuana.  Allergies  Allergen Reactions   Lamotrigine Hives    Family History  Problem Relation Age of Onset   Depression Mother    Miscarriages / [place] Mother    Thyroid disease Mother    Hypertension Father    Alcohol abuse Father    Learning disabilities Sister    Hyperlipidemia Maternal Grandmother    Alcohol abuse Maternal Grandfather    Alcohol abuse Paternal Grandmother    Hypertension Paternal  Grandfather      Prior to Admission medications   Medication Sig Start Date End Date Taking? Authorizing Provider  acamprosate (CAMPRAL) 333 MG tablet Take 2 tablets (666 mg total) by mouth 3 (three) times daily with meals. 03/20/22   Lula OlszewskiMorrison, Ryan G, MD  AUVELITY 45-105 MG TBCR TAKE ONE (1) TABLET BY MOUTH TWICE A DAY 05/14/22   White, Watt ClimesBrian A, NP  busPIRone (BUSPAR) 15 MG tablet Take 1 tablet (15 mg total) by mouth 3 (three) times daily. 12/27/21   Joan FloresWhite, Brian A, NP  DULoxetine (CYMBALTA) 30 MG capsule TAKE ONE CAPSULE BY MOUTH TWICE DAILY 05/14/22   White, Watt ClimesBrian A, NP  fexofenadine-pseudoephedrine (ALLEGRA-D ALLERGY &  CONGESTION) 60-120 MG 12 hr tablet Take 1 tablet by mouth 2 (two) times daily. 06/01/22   Lula OlszewskiMorrison, Ryan G, MD  gabapentin (NEURONTIN) 100 MG capsule Take 1 capsule (100 mg total) by mouth 3 (three) times daily. Use instead of the 300 mg dose to try to reduce total gabapentin usage and taper it down but only as tolerated if you feel you are getting anxiety or tremors okay to go back up on the dose a little 05/18/22   Lula OlszewskiMorrison, Ryan G, MD  gabapentin (NEURONTIN) 300 MG capsule Take 1 capsule (300 mg total) by mouth 3 (three) times daily. 04/25/22   Lula OlszewskiMorrison, Ryan G, MD  methocarbamol (ROBAXIN) 500 MG tablet Take 1 tablet (500 mg total) by mouth 4 (four) times daily. 03/19/22   Lula OlszewskiMorrison, Ryan G, MD  naltrexone (DEPADE) 50 MG tablet Take 1 tablet (50 mg total) by mouth daily. 12/27/21   Joan FloresWhite, Brian A, NP  norethindrone-ethinyl estradiol-FE (HAILEY FE 1/20) 1-20 MG-MCG tablet Take 1 tablet by mouth daily. 03/19/22   Lula OlszewskiMorrison, Ryan G, MD  propranolol (INDERAL) 20 MG tablet TAKE ONE TABLET BY MOUTH THREE TIMES DAILY 05/14/22   Joan FloresWhite, Brian A, NP  QUEtiapine (SEROQUEL) 50 MG tablet TAKE ONE TABLET BY MOUTH DAILY AT BEDTIME 05/14/22   White, Watt ClimesBrian A, NP  rizatriptan (MAXALT) 10 MG tablet TAKE ONE TABLET BY MOUTH AS NEEDED *may repeat in 2 hours if needed* 08/28/21   Janeece AgeeMorrow, Richard, NP  traZODone (DESYREL) 100 MG tablet TAKE ONE TABLET BY MOUTH DAILY AT BEDTIME 04/26/22   Joan FloresWhite, Brian A, NP    Physical Exam: Vitals:   06/26/22 1833 06/26/22 1841 06/26/22 2346  BP:  (!) 150/106 (!) 132/94  Pulse:  86 83  Resp:  17 16  Temp:  98.3 F (36.8 C) 98.2 F (36.8 C)  SpO2:  92% 93%  Weight: 68 kg    Height: 5\' 6"  (1.676 m)      Constitutional: NAD, calm  Eyes: PERTLA, lids and conjunctivae normal ENMT: Mucous membranes are moist. Posterior pharynx clear of any exudate or lesions.   Neck: supple, no masses  Respiratory: no wheezing, no crackles. No accessory muscle use.  Cardiovascular: S1 & S2 heard, regular  rate and rhythm. No extremity edema.   Abdomen: No distension, no tenderness, soft. Bowel sounds active.  Musculoskeletal: no clubbing / cyanosis. No joint deformity upper and lower extremities.   Skin: Faint ecchymosis and mild edema about the left eye. Warm, dry, well-perfused. Neurologic: CN 2-12 grossly intact. Sensation to light touch intact. Strength 5/5 in all 4 limbs. Alert and oriented. Resting tremor.  Psychiatric: Calm. Cooperative.    Labs and Imaging on Admission: I have personally reviewed following labs and imaging studies  CBC: Recent Labs  Lab 06/26/22 1852  WBC 7.0  HGB 12.8  HCT 36.6  MCV 103.7*  PLT 203   Basic Metabolic Panel: Recent Labs  Lab 06/26/22 1852  NA 136  K 2.9*  CL 94*  CO2 24  GLUCOSE 164*  BUN 6  CREATININE 0.77  CALCIUM 9.0   GFR: Estimated Creatinine Clearance: 100.6 mL/min (by C-G formula based on SCr of 0.77 mg/dL). Liver Function Tests: Recent Labs  Lab 06/26/22 1852  AST 65*  ALT 30  ALKPHOS 86  BILITOT 1.5*  PROT 7.3  ALBUMIN 4.3   No results for input(s): "LIPASE", "AMYLASE" in the last 168 hours. No results for input(s): "AMMONIA" in the last 168 hours. Coagulation Profile: No results for input(s): "INR", "PROTIME" in the last 168 hours. Cardiac Enzymes: No results for input(s): "CKTOTAL", "CKMB", "CKMBINDEX", "TROPONINI" in the last 168 hours. BNP (last 3 results) No results for input(s): "PROBNP" in the last 8760 hours. HbA1C: No results for input(s): "HGBA1C" in the last 72 hours. CBG: No results for input(s): "GLUCAP" in the last 168 hours. Lipid Profile: No results for input(s): "CHOL", "HDL", "LDLCALC", "TRIG", "CHOLHDL", "LDLDIRECT" in the last 72 hours. Thyroid Function Tests: No results for input(s): "TSH", "T4TOTAL", "FREET4", "T3FREE", "THYROIDAB" in the last 72 hours. Anemia Panel: No results for input(s): "VITAMINB12", "FOLATE", "FERRITIN", "TIBC", "IRON", "RETICCTPCT" in the last 72 hours. Urine  analysis:    Component Value Date/Time   COLORURINE YELLOW 12/21/2020 1542   APPEARANCEUR CLEAR 12/21/2020 1542   LABSPEC 1.015 12/21/2020 1542   PHURINE 7.5 12/21/2020 1542   GLUCOSEU NEGATIVE 12/21/2020 1542   HGBUR NEGATIVE 12/21/2020 1542   BILIRUBINUR NEGATIVE 12/21/2020 1542   BILIRUBINUR Negative 12/24/2018 1147   KETONESUR NEGATIVE 12/21/2020 1542   PROTEINUR 30 (A) 12/14/2020 1455   UROBILINOGEN 0.2 12/21/2020 1542   NITRITE NEGATIVE 12/21/2020 1542   LEUKOCYTESUR NEGATIVE 12/21/2020 1542   Sepsis Labs: @LABRCNTIP (procalcitonin:4,lacticidven:4) )No results found for this or any previous visit (from the past 240 hour(s)).   Radiological Exams on Admission: CT Head Wo Contrast  Result Date: 06/27/2022 CLINICAL DATA:  Seizure. EXAM: CT HEAD WITHOUT CONTRAST CT CERVICAL SPINE WITHOUT CONTRAST TECHNIQUE: Multidetector CT imaging of the head and cervical spine was performed following the standard protocol without intravenous contrast. Multiplanar CT image reconstructions of the cervical spine were also generated. RADIATION DOSE REDUCTION: This exam was performed according to the departmental dose-optimization program which includes automated exposure control, adjustment of the mA and/or kV according to patient size and/or use of iterative reconstruction technique. COMPARISON:  02/27/2021. FINDINGS: CT HEAD FINDINGS Brain: No acute intracranial hemorrhage, midline shift or mass effect. No extra-axial fluid collection. Gray-white matter differentiation is within normal limits. No hydrocephalus. Vascular: No hyperdense vessel or unexpected calcification. Skull: Normal. Negative for fracture or focal lesion. Sinuses/Orbits: No acute finding. Other: None. CT CERVICAL SPINE FINDINGS Alignment: Normal.  There is loss of normal cervical lordosis. Skull base and vertebrae: No acute fracture. No primary bone lesion or focal pathologic process. Soft tissues and spinal canal: No prevertebral fluid or  swelling. No visible canal hematoma. Disc levels: Mild intervertebral disc space narrowing and osteophyte formation at C5-C6. Upper chest: Negative. Other: None. IMPRESSION: 1. No acute intracranial process. 2. No acute fracture or subluxation in the cervical spine. Electronically Signed   By: 04/29/2021 M.D.   On: 06/27/2022 00:32   CT Cervical Spine Wo Contrast  Result Date: 06/27/2022 CLINICAL DATA:  Seizure. EXAM: CT HEAD WITHOUT CONTRAST CT CERVICAL SPINE WITHOUT CONTRAST TECHNIQUE: Multidetector CT imaging of the  head and cervical spine was performed following the standard protocol without intravenous contrast. Multiplanar CT image reconstructions of the cervical spine were also generated. RADIATION DOSE REDUCTION: This exam was performed according to the departmental dose-optimization program which includes automated exposure control, adjustment of the mA and/or kV according to patient size and/or use of iterative reconstruction technique. COMPARISON:  02/27/2021. FINDINGS: CT HEAD FINDINGS Brain: No acute intracranial hemorrhage, midline shift or mass effect. No extra-axial fluid collection. Gray-white matter differentiation is within normal limits. No hydrocephalus. Vascular: No hyperdense vessel or unexpected calcification. Skull: Normal. Negative for fracture or focal lesion. Sinuses/Orbits: No acute finding. Other: None. CT CERVICAL SPINE FINDINGS Alignment: Normal.  There is loss of normal cervical lordosis. Skull base and vertebrae: No acute fracture. No primary bone lesion or focal pathologic process. Soft tissues and spinal canal: No prevertebral fluid or swelling. No visible canal hematoma. Disc levels: Mild intervertebral disc space narrowing and osteophyte formation at C5-C6. Upper chest: Negative. Other: None. IMPRESSION: 1. No acute intracranial process. 2. No acute fracture or subluxation in the cervical spine. Electronically Signed   By: Thornell Sartorius M.D.   On: 06/27/2022 00:32      Assessment/Plan  1. Acute encephalopathy  - Pt with hx of alcohol abuse and seizures no longer on AED was found confused and diaphoretic   - No acute findings on head CT  - Alcohol withdrawal suspected, possibly with seizure (normal lactate noted but drawn >3 hrs after she was found down), improved after Ativan in ED  - Replace potassium, check magnesium, treat alcohol withdrawal with Ativan   2. Alcohol withdrawal  - Monitor with CIWA, use Ativan as needed, supplement vitamins   3. Hypokalemia  - Replacing    4. N/V/D   - Pt reports N/V and several loose stools since yesterday  - Abdominal exam benign and no fever/chills  - Likely from alcohol withdrawal, possibly viral GE  - Continue supportive care, advance diet as tolerated    DVT prophylaxis: Lovenox  Code Status: Full  Level of Care: Level of care: Telemetry Medical Family Communication: None present  Disposition Plan:  Patient is from: Home  Anticipated d/c is to: Home  Anticipated d/c date is: Possibly as early as 06/27/22  Patient currently: Pending improved/stable mental status, tolerance of adequate oral intake, and transition to oral medications  Consults called: none Admission status: Observation     Briscoe Deutscher, MD [facility]  06/27/2022, 12:40 AM

## 2022-06-27 NOTE — Telephone Encounter (Signed)
Patient's grandmother [name] requests to be called by Dr. Jon Billings at ph# [phone] to discuss Patient who is Hospitalized (did not give further details).  Requests to be called asap

## 2022-06-27 NOTE — Progress Notes (Signed)
PROGRESS NOTE        PATIENT DETAILS Name: Natasha Pope Age: 26 y.o. Sex: female Date of Birth: 06/30/1996 Admit Date: 06/26/2022 Admitting Physician Briscoe Deutscher, MD IRW:[id number], Johnella Moloney, MD  Brief Summary: Patient is a 26 y.o.  female with history of alcohol-related seizure disorder, depression/anxiety-who quit drinking approximately 3 days back-she was found confused/diaphoretic-laying in the hallway of a local hotel where she lives and subsequently brought to the ED.  Significant events: 12/5>> admit to [facility]-confusion-possible seizures.  Significant studies: 12/5>> CT head: No acute intracranial process 12/5>> CT C-spine: No fracture/subluxation C-spine.  Significant microbiology data: None  Procedures: None  Consults: None  Subjective: Sleepy-nauseous-apparently vomited last night.  Objective: Vitals: Blood pressure (!) 144/116, pulse 100, temperature 98.9 F (37.2 C), temperature source Oral, resp. rate 19, height 5\' 6"  (1.676 m), weight 68 kg, SpO2 91 %.   Exam: Gen Exam:Alert awake-not in any distress HEENT:atraumatic, normocephalic Chest: B/L clear to auscultation anteriorly CVS:S1S2 regular Abdomen:soft non tender, non distended Extremities:no edema Neurology: Non focal Skin: no rash  Pertinent Labs/Radiology:    Latest Ref Rng & Units 06/27/2022    5:13 AM 06/26/2022    6:52 PM 03/08/2022   12:07 PM  CBC  WBC 4.0 - 10.5 K/uL 7.2  7.0  6.5   Hemoglobin 12.0 - 15.0 g/dL 03/10/2022  67.6  19.5   Hematocrit 36.0 - 46.0 % 34.0  36.6  33.8   Platelets 150 - 400 K/uL 186  203  323     Lab Results  Component Value Date   NA 135 06/27/2022   K 3.3 (L) 06/27/2022   CL 94 (L) 06/27/2022   CO2 26 06/27/2022      Assessment/Plan: Acute metabolic encephalopathy Suspect due to postictal state from possible seizures-and ongoing alcohol withdrawal symptoms. Neuroimaging negative Check EEG Ativan per [facility] protocol  EtOH  withdrawal Reported last drink 3 days prior to this hospitalization Continue Ativan per [facility] protocol Counseled  Hypokalemia/hypomagnesemia Replete/recheck  Nausea/vomiting/diarrhea Unclear whether this is from a viral syndrome or secondary to alcohol withdrawal Nauseous but seems to be overall improved Abdominal exam benign Supportive care  History of alcohol-related seizures Per patient-her primary neurologist took her off seizure medications last year. Check EEG  Anxiety/depression Resume Seroquel/trazodone  BMI: Estimated body mass index is 24.21 kg/m as calculated from the following:   Height as of this encounter: 5\' 6"  (1.676 m).   Weight as of this encounter: 68 kg.   Code status:   Code Status: Full Code   DVT Prophylaxis: enoxaparin (LOVENOX) injection 40 mg Start: 06/27/22 0800   Family Communication: None at bedside   Disposition Plan: Status is: Observation The patient will require care spanning > 2 midnights and should be moved to inpatient because: Alcohol withdrawal-possible seizure-not yet stable for discharge.   Planned Discharge Destination:Home   Diet: Diet Order             Diet clear liquid Room service appropriate? Yes; Fluid consistency: Thin  Diet effective now                     Antimicrobial agents: Anti-infectives (From admission, onward)    None        MEDICATIONS: Scheduled Meds:  enoxaparin (LOVENOX) injection  40 mg Subcutaneous Q24H   folic acid  1 mg  Oral Daily   LORazepam  0-4 mg Intravenous Q6H   Followed by   Melene Muller ON 06/29/2022] LORazepam  0-4 mg Intravenous Q12H   multivitamin with minerals  1 tablet Oral Daily   sodium chloride flush  3 mL Intravenous Q12H   thiamine  100 mg Oral Daily   Or   thiamine  100 mg Intravenous Daily   Continuous Infusions:  lactated ringers 125 mL/hr at 06/27/22 0526   PRN Meds:.acetaminophen **OR** acetaminophen, LORazepam **OR** LORazepam, ondansetron (ZOFRAN) IV,  oxyCODONE   I have personally reviewed following labs and imaging studies  LABORATORY DATA: CBC: Recent Labs  Lab 06/26/22 1852 06/27/22 0513  WBC 7.0 7.2  HGB 12.8 11.8*  HCT 36.6 34.0*  MCV 103.7* 104.0*  PLT 203 186    Basic Metabolic Panel: Recent Labs  Lab 06/26/22 1852 06/27/22 0237  NA 136 135  K 2.9* 3.3*  CL 94* 94*  CO2 24 26  GLUCOSE 164* 118*  BUN 6 6  CREATININE 0.77 0.77  CALCIUM 9.0 8.7*  MG  --  1.5*  PHOS  --  3.2    GFR: Estimated Creatinine Clearance: 100.6 mL/min (by C-G formula based on SCr of 0.77 mg/dL).  Liver Function Tests: Recent Labs  Lab 06/26/22 1852 06/27/22 0237  AST 65* 46*  ALT 30 25  ALKPHOS 86 76  BILITOT 1.5* 1.3*  PROT 7.3 6.7  ALBUMIN 4.3 4.2   No results for input(s): "LIPASE", "AMYLASE" in the last 168 hours. No results for input(s): "AMMONIA" in the last 168 hours.  Coagulation Profile: No results for input(s): "INR", "PROTIME" in the last 168 hours.  Cardiac Enzymes: No results for input(s): "CKTOTAL", "CKMB", "CKMBINDEX", "TROPONINI" in the last 168 hours.  BNP (last 3 results) No results for input(s): "PROBNP" in the last 8760 hours.  Lipid Profile: No results for input(s): "CHOL", "HDL", "LDLCALC", "TRIG", "CHOLHDL", "LDLDIRECT" in the last 72 hours.  Thyroid Function Tests: No results for input(s): "TSH", "T4TOTAL", "FREET4", "T3FREE", "THYROIDAB" in the last 72 hours.  Anemia Panel: No results for input(s): "VITAMINB12", "FOLATE", "FERRITIN", "TIBC", "IRON", "RETICCTPCT" in the last 72 hours.  Urine analysis:    Component Value Date/Time   COLORURINE AMBER (A) 06/27/2022 0508   APPEARANCEUR HAZY (A) 06/27/2022 0508   LABSPEC 1.024 06/27/2022 0508   PHURINE 7.0 06/27/2022 0508   GLUCOSEU NEGATIVE 06/27/2022 0508   GLUCOSEU NEGATIVE 12/21/2020 1542   HGBUR NEGATIVE 06/27/2022 0508   BILIRUBINUR SMALL (A) 06/27/2022 0508   BILIRUBINUR Negative 12/24/2018 1147   KETONESUR 20 (A) 06/27/2022  0508   PROTEINUR 100 (A) 06/27/2022 0508   UROBILINOGEN 0.2 12/21/2020 1542   NITRITE NEGATIVE 06/27/2022 0508   LEUKOCYTESUR NEGATIVE 06/27/2022 0508    Sepsis Labs: Lactic Acid, Venous    Component Value Date/Time   LATICACIDVEN 1.5 06/26/2022 2047    MICROBIOLOGY: No results found for this or any previous visit (from the past 240 hour(s)).  RADIOLOGY STUDIES/RESULTS: CT Head Wo Contrast  Result Date: 06/27/2022 CLINICAL DATA:  Seizure. EXAM: CT HEAD WITHOUT CONTRAST CT CERVICAL SPINE WITHOUT CONTRAST TECHNIQUE: Multidetector CT imaging of the head and cervical spine was performed following the standard protocol without intravenous contrast. Multiplanar CT image reconstructions of the cervical spine were also generated. RADIATION DOSE REDUCTION: This exam was performed according to the departmental dose-optimization program which includes automated exposure control, adjustment of the mA and/or kV according to patient size and/or use of iterative reconstruction technique. COMPARISON:  02/27/2021. FINDINGS: CT HEAD FINDINGS  Brain: No acute intracranial hemorrhage, midline shift or mass effect. No extra-axial fluid collection. Gray-white matter differentiation is within normal limits. No hydrocephalus. Vascular: No hyperdense vessel or unexpected calcification. Skull: Normal. Negative for fracture or focal lesion. Sinuses/Orbits: No acute finding. Other: None. CT CERVICAL SPINE FINDINGS Alignment: Normal.  There is loss of normal cervical lordosis. Skull base and vertebrae: No acute fracture. No primary bone lesion or focal pathologic process. Soft tissues and spinal canal: No prevertebral fluid or swelling. No visible canal hematoma. Disc levels: Mild intervertebral disc space narrowing and osteophyte formation at C5-C6. Upper chest: Negative. Other: None. IMPRESSION: 1. No acute intracranial process. 2. No acute fracture or subluxation in the cervical spine. Electronically Signed   By: Thornell Sartorius M.D.   On: 06/27/2022 00:32   CT Cervical Spine Wo Contrast  Result Date: 06/27/2022 CLINICAL DATA:  Seizure. EXAM: CT HEAD WITHOUT CONTRAST CT CERVICAL SPINE WITHOUT CONTRAST TECHNIQUE: Multidetector CT imaging of the head and cervical spine was performed following the standard protocol without intravenous contrast. Multiplanar CT image reconstructions of the cervical spine were also generated. RADIATION DOSE REDUCTION: This exam was performed according to the departmental dose-optimization program which includes automated exposure control, adjustment of the mA and/or kV according to patient size and/or use of iterative reconstruction technique. COMPARISON:  02/27/2021. FINDINGS: CT HEAD FINDINGS Brain: No acute intracranial hemorrhage, midline shift or mass effect. No extra-axial fluid collection. Gray-white matter differentiation is within normal limits. No hydrocephalus. Vascular: No hyperdense vessel or unexpected calcification. Skull: Normal. Negative for fracture or focal lesion. Sinuses/Orbits: No acute finding. Other: None. CT CERVICAL SPINE FINDINGS Alignment: Normal.  There is loss of normal cervical lordosis. Skull base and vertebrae: No acute fracture. No primary bone lesion or focal pathologic process. Soft tissues and spinal canal: No prevertebral fluid or swelling. No visible canal hematoma. Disc levels: Mild intervertebral disc space narrowing and osteophyte formation at C5-C6. Upper chest: Negative. Other: None. IMPRESSION: 1. No acute intracranial process. 2. No acute fracture or subluxation in the cervical spine. Electronically Signed   By: Thornell Sartorius M.D.   On: 06/27/2022 00:32     LOS: 0 days   Jeoffrey Massed, MD  [facility]    To contact the attending provider between 7A-7P or the covering provider during after hours 7P-7A, please log into the web site [email or web address] and access using universal  password for that web site. If you do not have the  password, please call the hospital operator.  06/27/2022, 8:20 AM

## 2022-06-27 NOTE — ED Notes (Signed)
ED TO INPATIENT HANDOFF REPORT    S Name/Age/Gender Natasha Pope 26 y.o. female Room/Bed: H021C/H021C  Code Status   Code Status: Full Code  Home/SNF/Other Home Patient oriented to: self, place, time, and situation Is this baseline? Yes   Triage Complete: Triage complete  Chief Complaint Acute encephalopathy [G93.40]  Triage Note Pt BIB EMS due to a seizure. Pt lives in hotel. Pt was found in hallway, ems FOUND PT TO BE POST- ICTAL. Seizure not witnessed.Pt states she thinks she has had seizure in past. Pt is axox3 on arrival. HTN with EMS. Pt has t-wave inversions. Pt has been vomitting/ diarrhea all day.    Allergies Allergies  Allergen Reactions   Lamotrigine Hives    Level of Care/Admitting Diagnosis ED Disposition     ED Disposition  Admit   Condition  --   Comment  Hospital Area: [facility] [[id number]]  Level of Care: Telemetry Medical [104]  May place patient in observation at [facility] or [facility] if equivalent level of care is available:: Yes  Covid Evaluation: Asymptomatic - no recent exposure (last 10 days) testing not required  Diagnosis: Acute encephalopathy [301601]  Admitting Physician: Briscoe Deutscher [[id number]]  Attending Physician: Briscoe Deutscher [[id number]]          B Medical/Surgery History Past Medical History:  Diagnosis Date   Anxiety    Asthma    Depression    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   Technique we just overweight by BMI but she puts a lot of the weight on her abdomen so I am documenting this as obesity is at least truncal obesity therefore I am going to try to send in for [facility] and see if insurance will cover as not only would help weight loss but actually will reduce her desire to drink   PTSD  (post-traumatic stress disorder) 03/19/2022   Pyelonephritis    Seizure-like activity (HCC)    Seizures (HCC)    Sepsis (HCC)    Snoring 04/17/2022   Past Surgical History:  Procedure Laterality Date   dental procedure       A IV Location/Drains/Wounds Patient Lines/Drains/Airways Status     Active Line/Drains/Airways     Name Placement date Placement time Site Days   Peripheral IV 06/26/22 22 G Anterior;Right Hand 06/26/22  1834  Hand  1   Peripheral IV 06/27/22 20 G Anterior;Left Hand 06/27/22  0028  Hand  less than 1   Wound / Incision (Open or Dehisced) 02/28/21 Laceration Eye Left 02/28/21  1047  Eye  484            Intake/Output Last 24 hours  Intake/Output Summary (Last 24 hours) at 06/27/2022 1049 Last data filed at 06/27/2022 0453 Gross per 24 hour  Intake 298.68 ml  Output --  Net 298.68 ml    Labs/Imaging Results for orders placed or performed during the hospital encounter of 06/26/22 (from the past 48 hour(s))  CBC     Status: Abnormal   Collection Time: 06/26/22  6:52 PM  Result Value Ref Range   WBC 7.0 4.0 - 10.5 K/uL   RBC 3.53 (L) 3.87 - 5.11 MIL/uL   Hemoglobin 12.8 12.0 - 15.0 g/dL   HCT 42.7 06.2 - 37.6 %  MCV 103.7 (H) 80.0 - 100.0 fL   MCH 36.3 (H) 26.0 - 34.0 pg   MCHC 35.0 30.0 - 36.0 g/dL   RDW 84.6 (H) 96.2 - 95.2 %   Platelets 203 150 - 400 K/uL   nRBC 0.6 (H) 0.0 - 0.2 %    Comment: Performed at [facility], [address], [place], [place] [address]  Comprehensive metabolic panel     Status: Abnormal   Collection Time: 06/26/22  6:52 PM  Result Value Ref Range   Sodium 136 135 - 145 mmol/L   Potassium 2.9 (L) 3.5 - 5.1 mmol/L   Chloride 94 (L) 98 - 111 mmol/L   CO2 24 22 - 32 mmol/L   Glucose, Bld 164 (H) 70 - 99 mg/dL    Comment: Glucose reference range applies only to samples taken after fasting for at least 8 hours.   BUN 6 6 - 20 mg/dL   Creatinine, Ser 4.40 0.44 - 1.00 mg/dL   Calcium 9.0 8.9 - 10.2 mg/dL   Total  Protein 7.3 6.5 - 8.1 g/dL   Albumin 4.3 3.5 - 5.0 g/dL   AST 65 (H) 15 - 41 U/L   ALT 30 0 - 44 U/L   Alkaline Phosphatase 86 38 - 126 U/L   Total Bilirubin 1.5 (H) 0.3 - 1.2 mg/dL   GFR, Estimated >72 >53 mL/min    Comment: (NOTE) Calculated using the CKD-EPI Creatinine Equation (2021)    Anion gap 18 (H) 5 - 15    Comment: Performed at [facility], [address], [place], [place] [address]  Ethanol     Status: None   Collection Time: 06/26/22  8:47 PM  Result Value Ref Range   Alcohol, Ethyl (B) <10 <10 mg/dL    Comment: (NOTE) Lowest detectable limit for serum alcohol is 10 mg/dL.  For medical purposes only. Performed at [facility], [address], [place], [place] [address]   Lactic acid, plasma     Status: None   Collection Time: 06/26/22  8:47 PM  Result Value Ref Range   Lactic Acid, Venous 1.5 0.5 - 1.9 mmol/L    Comment: Performed at [facility], [address], [place], [place] [address]  HIV Antibody (routine testing w rflx)     Status: None   Collection Time: 06/27/22  2:37 AM  Result Value Ref Range   HIV Screen 4th Generation wRfx Non Reactive Non Reactive    Comment: Performed at [facility], [address] [place], [place] [address]  Magnesium     Status: Abnormal   Collection Time: 06/27/22  2:37 AM  Result Value Ref Range   Magnesium 1.5 (L) 1.7 - 2.4 mg/dL    Comment: Performed at [facility], [address] [place], [place] [address]  Phosphorus     Status: None   Collection Time: 06/27/22  2:37 AM  Result Value Ref Range   Phosphorus 3.2 2.5 - 4.6 mg/dL    Comment: Performed at [facility], [address] [place], [place] [address]  Comprehensive metabolic panel     Status: Abnormal   Collection Time: 06/27/22  2:37 AM  Result Value Ref Range   Sodium 135 135 - 145 mmol/L   Potassium 3.3 (L) 3.5 - 5.1 mmol/L   Chloride 94 (L) 98 - 111 mmol/L   CO2 26 22 - 32 mmol/L   Glucose, Bld 118 (H)  70 - 99  mg/dL    Comment: Glucose reference range applies only to samples taken after fasting for at least 8 hours.   BUN 6 6 - 20 mg/dL   Creatinine, Ser 1.76 0.44 - 1.00 mg/dL   Calcium 8.7 (L) 8.9 - 10.3 mg/dL   Total Protein 6.7 6.5 - 8.1 g/dL   Albumin 4.2 3.5 - 5.0 g/dL   AST 46 (H) 15 - 41 U/L   ALT 25 0 - 44 U/L   Alkaline Phosphatase 76 38 - 126 U/L   Total Bilirubin 1.3 (H) 0.3 - 1.2 mg/dL   GFR, Estimated >16 >07 mL/min    Comment: (NOTE) Calculated using the CKD-EPI Creatinine Equation (2021)    Anion gap 15 5 - 15    Comment: Performed at [facility], [address], [place], [place] [address]  Urinalysis, Routine w reflex microscopic Urine, Clean Catch     Status: Abnormal   Collection Time: 06/27/22  5:08 AM  Result Value Ref Range   Color, Urine AMBER (A) YELLOW    Comment: BIOCHEMICALS MAY BE AFFECTED BY COLOR   APPearance HAZY (A) CLEAR   Specific Gravity, Urine 1.024 1.005 - 1.030   pH 7.0 5.0 - 8.0   Glucose, UA NEGATIVE NEGATIVE mg/dL   Hgb urine dipstick NEGATIVE NEGATIVE   Bilirubin Urine SMALL (A) NEGATIVE   Ketones, ur 20 (A) NEGATIVE mg/dL   Protein, ur 269 (A) NEGATIVE mg/dL   Nitrite NEGATIVE NEGATIVE   Leukocytes,Ua NEGATIVE NEGATIVE   RBC / HPF 0-5 0 - 5 RBC/hpf   WBC, UA 0-5 0 - 5 WBC/hpf   Bacteria, UA NONE SEEN NONE SEEN   Squamous Epithelial / LPF 6-10 0 - 5   Mucus PRESENT    Hyaline Casts, UA PRESENT     Comment: Performed at [facility], [address], [place], [place] [address]  Pregnancy, urine     Status: None   Collection Time: 06/27/22  5:08 AM  Result Value Ref Range   Preg Test, Ur NEGATIVE NEGATIVE    Comment: Performed at [facility], [address], [place], [place] [address]  Rapid urine drug screen (hospital performed)     Status: Abnormal   Collection Time: 06/27/22  5:08 AM  Result Value Ref Range   Opiates NONE DETECTED NONE DETECTED   Cocaine NONE DETECTED NONE DETECTED   Benzodiazepines POSITIVE (A)  NONE DETECTED   Amphetamines NONE DETECTED NONE DETECTED   Tetrahydrocannabinol POSITIVE (A) NONE DETECTED   Barbiturates NONE DETECTED NONE DETECTED    Comment: (NOTE) DRUG SCREEN FOR MEDICAL PURPOSES ONLY.  IF CONFIRMATION IS NEEDED FOR ANY PURPOSE, NOTIFY LAB WITHIN 5 DAYS.  LOWEST DETECTABLE LIMITS FOR URINE DRUG SCREEN Drug Class                     Cutoff (ng/mL) Amphetamine and metabolites    1000 Barbiturate and metabolites    200 Benzodiazepine                 200 Opiates and metabolites        300 Cocaine and metabolites        300 THC                            50 Performed at [facility], [address], [place], [place] [address]   CBC  Status: Abnormal   Collection Time: 06/27/22  5:13 AM  Result Value Ref Range   WBC 7.2 4.0 - 10.5 K/uL   RBC 3.27 (L) 3.87 - 5.11 MIL/uL   Hemoglobin 11.8 (L) 12.0 - 15.0 g/dL   HCT 16.1 (L) 09.6 - 04.5 %   MCV 104.0 (H) 80.0 - 100.0 fL   MCH 36.1 (H) 26.0 - 34.0 pg   MCHC 34.7 30.0 - 36.0 g/dL   RDW 40.9 (H) 81.1 - 91.4 %   Platelets 186 150 - 400 K/uL   nRBC 0.8 (H) 0.0 - 0.2 %    Comment: Performed at [facility], [address], [place], [place] [address]   CT Head Wo Contrast  Result Date: 06/27/2022 CLINICAL DATA:  Seizure. EXAM: CT HEAD WITHOUT CONTRAST CT CERVICAL SPINE WITHOUT CONTRAST TECHNIQUE: Multidetector CT imaging of the head and cervical spine was performed following the standard protocol without intravenous contrast. Multiplanar CT image reconstructions of the cervical spine were also generated. RADIATION DOSE REDUCTION: This exam was performed according to the departmental dose-optimization program which includes automated exposure control, adjustment of the mA and/or kV according to patient size and/or use of iterative reconstruction technique. COMPARISON:  02/27/2021. FINDINGS: CT HEAD FINDINGS Brain: No acute intracranial hemorrhage, midline shift or mass effect. No extra-axial fluid  collection. Gray-white matter differentiation is within normal limits. No hydrocephalus. Vascular: No hyperdense vessel or unexpected calcification. Skull: Normal. Negative for fracture or focal lesion. Sinuses/Orbits: No acute finding. Other: None. CT CERVICAL SPINE FINDINGS Alignment: Normal.  There is loss of normal cervical lordosis. Skull base and vertebrae: No acute fracture. No primary bone lesion or focal pathologic process. Soft tissues and spinal canal: No prevertebral fluid or swelling. No visible canal hematoma. Disc levels: Mild intervertebral disc space narrowing and osteophyte formation at C5-C6. Upper chest: Negative. Other: None. IMPRESSION: 1. No acute intracranial process. 2. No acute fracture or subluxation in the cervical spine. Electronically Signed   By: Thornell Sartorius M.D.   On: 06/27/2022 00:32   CT Cervical Spine Wo Contrast  Result Date: 06/27/2022 CLINICAL DATA:  Seizure. EXAM: CT HEAD WITHOUT CONTRAST CT CERVICAL SPINE WITHOUT CONTRAST TECHNIQUE: Multidetector CT imaging of the head and cervical spine was performed following the standard protocol without intravenous contrast. Multiplanar CT image reconstructions of the cervical spine were also generated. RADIATION DOSE REDUCTION: This exam was performed according to the departmental dose-optimization program which includes automated exposure control, adjustment of the mA and/or kV according to patient size and/or use of iterative reconstruction technique. COMPARISON:  02/27/2021. FINDINGS: CT HEAD FINDINGS Brain: No acute intracranial hemorrhage, midline shift or mass effect. No extra-axial fluid collection. Gray-white matter differentiation is within normal limits. No hydrocephalus. Vascular: No hyperdense vessel or unexpected calcification. Skull: Normal. Negative for fracture or focal lesion. Sinuses/Orbits: No acute finding. Other: None. CT CERVICAL SPINE FINDINGS Alignment: Normal.  There is loss of normal cervical lordosis.  Skull base and vertebrae: No acute fracture. No primary bone lesion or focal pathologic process. Soft tissues and spinal canal: No prevertebral fluid or swelling. No visible canal hematoma. Disc levels: Mild intervertebral disc space narrowing and osteophyte formation at C5-C6. Upper chest: Negative. Other: None. IMPRESSION: 1. No acute intracranial process. 2. No acute fracture or subluxation in the cervical spine. Electronically Signed   By: Thornell Sartorius M.D.   On: 06/27/2022 00:32    Pending Labs [employer] (From admission, onward)     Start     Ordered  07/04/22 0500  Creatinine, serum  (enoxaparin (LOVENOX)    CrCl >/= 30 ml/min)  Weekly,   R     Comments: while on enoxaparin therapy    06/27/22 0039   06/28/22 0500  Magnesium  Tomorrow morning,   R        06/27/22 0831   06/27/22 0500  Comprehensive metabolic panel  Daily,   R      06/27/22 0039   06/27/22 0500  CBC  Daily,   R      06/27/22 0039            Vitals/Pain Today's Vitals   06/27/22 0445 06/27/22 0600 06/27/22 0700 06/27/22 0912  BP: 133/86 (!) 130/95 (!) 144/116   Pulse: 84 94 100   Resp: 16 19 19    Temp: 98.9 F (37.2 C)   98.7 F (37.1 C)  TempSrc: Oral   Oral  SpO2: 93% 91% 91%   Weight:      Height:      PainSc:        Isolation Precautions No active isolations  Medications Medications  LORazepam (ATIVAN) tablet 1-4 mg (has no administration in time range)    Or  LORazepam (ATIVAN) injection 1-4 mg (has no administration in time range)  thiamine (VITAMIN B1) tablet 100 mg (100 mg Oral Given 06/27/22 0911)    Or  thiamine (VITAMIN B1) injection 100 mg ( Intravenous See Alternative 06/27/22 0911)  folic acid (FOLVITE) tablet 1 mg (1 mg Oral Given 06/27/22 0911)  multivitamin with minerals tablet 1 tablet (1 tablet Oral Given 06/27/22 0911)  LORazepam (ATIVAN) injection 0-4 mg ( Intravenous Not Given 06/27/22 0703)    Followed by  LORazepam (ATIVAN) injection 0-4 mg (has no administration in  time range)  enoxaparin (LOVENOX) injection 40 mg (40 mg Subcutaneous Given 06/27/22 0853)  sodium chloride flush (NS) 0.9 % injection 3 mL (3 mLs Intravenous Given 06/27/22 0913)  acetaminophen (TYLENOL) tablet 650 mg (has no administration in time range)    Or  acetaminophen (TYLENOL) suppository 650 mg (has no administration in time range)  oxyCODONE (Oxy IR/ROXICODONE) immediate release tablet 5 mg (has no administration in time range)  lactated ringers infusion ( Intravenous New Bag/Given 06/27/22 0526)  ondansetron (ZOFRAN) injection 4 mg (4 mg Intravenous Given 06/27/22 0534)  QUEtiapine (SEROQUEL) tablet 50 mg (has no administration in time range)  traZODone (DESYREL) tablet 100 mg (has no administration in time range)  ondansetron (ZOFRAN-ODT) disintegrating tablet 4 mg (4 mg Oral Given 06/26/22 2041)  ondansetron (ZOFRAN) injection 4 mg (4 mg Intravenous Given 06/26/22 2226)  LORazepam (ATIVAN) injection 1 mg (1 mg Intravenous Given 06/26/22 2315)  potassium chloride SA (KLOR-CON M) CR tablet 40 mEq (40 mEq Oral Given 06/27/22 0048)  potassium chloride 10 mEq in 100 mL IVPB (0 mEq Intravenous Stopped 06/27/22 0453)  magnesium sulfate IVPB 1 g 100 mL (0 g Intravenous Stopped 06/27/22 0327)  magnesium sulfate IVPB 2 g 50 mL (0 g Intravenous Stopped 06/27/22 0955)    Mobility walks Low fall risk   Focused Assessments Neuro Assessment Handoff:  Swallow screen pass? Yes          Neuro Assessment: Exceptions to WDL Neuro Checks:      Last Documented NIHSS Modified Score:   Has TPA been given? No If patient is a Neuro Trauma and patient is going to OR before floor call report to 4N Charge nurse: [phone] or [phone]   R Recommendations: See Admitting Provider  Note

## 2022-06-27 NOTE — Progress Notes (Signed)
EEG complete - results pending 

## 2022-06-27 NOTE — Procedures (Signed)
[facility]  Routine EEG Report   Demographics: Patient Name:   Natasha Pope, Natasha Pope  Date of Birth:   [birth year]  Identification Number:   MRN - [id number]  Study Times: Duration:   23 minutes  Indication(s): Spells, Eval for Seizures, Seizures, Syncope  Technical Summary:  This EEG was performed utilizing standard International 10-20 System of electrode placement. Data were obtained, stored, and interpreted utilizing referential montage recording, with reformatting to longitudinal, transverse bipolar, and referential montages as necessary for interpretation.  State(s):       Awake   Activation Procedures: Hyperventilation: Performed : Photic Stimulation: Performed : Photic stimulation was performed at frequencies between 1 and 21 Hz demonstrating symmetric bioccipital driving responses over a range of frequencies.   EEG Description:  During the waking state, the predominant posterior resting frequency was a rhythmic, symmetric, bioccipital 7-8 Hz, 20-40 microvolt rhythm.The remaining background activities consisted of diffuse, polymorphic, low to moderate amplitude, mixed frequency alpha and beta range patterns. Drowsiness and sleep were not demonstrated. Severe myogenic and movement artifact were present throughout the recording and precluded a more thorough interpretation of the data.  Impression:  Abnormal EEG due to:    1) Theta range background slowing   Clinical Correlation:  This EEG is suggestive of a mild encephalopathy, but is nonspecific as to etiology. The absence of epileptiform abnormalities does not preclude a clinical diagnosis of seizures. Severe artifact precluded a more thorough interpretation of the data.   If clinical concern for seizure persists, suggest repetition of the study when the patient is in a more relaxed state.  Petra Kuba) Loistine Chance II MD- American Board of Psychiatry and Neurology  (ABPN); American Board of Clinical Neurophysiology  (ABCN)      [facility] For Inpatient follow-up with [facility] physician please call [facility] [phone]. This is not an outpatient service. Post hospital discharge, please contact hospital directly.

## 2022-06-27 NOTE — Progress Notes (Signed)
CSW added substance abuse resources to patient's AVS.  Ido Wollman, MSW, LCSW Transitions of Care  Clinical Social Worker II [phone]  

## 2022-06-28 ENCOUNTER — Telehealth: Payer: Self-pay | Admitting: Internal Medicine

## 2022-06-28 DIAGNOSIS — D7589 Other specified diseases of blood and blood-forming organs: Secondary | ICD-10-CM | POA: Diagnosis present

## 2022-06-28 DIAGNOSIS — Z87891 Personal history of nicotine dependence: Secondary | ICD-10-CM | POA: Diagnosis not present

## 2022-06-28 DIAGNOSIS — Z8249 Family history of ischemic heart disease and other diseases of the circulatory system: Secondary | ICD-10-CM | POA: Diagnosis not present

## 2022-06-28 DIAGNOSIS — R569 Unspecified convulsions: Secondary | ICD-10-CM | POA: Diagnosis present

## 2022-06-28 DIAGNOSIS — E876 Hypokalemia: Secondary | ICD-10-CM | POA: Diagnosis present

## 2022-06-28 DIAGNOSIS — Z79899 Other long term (current) drug therapy: Secondary | ICD-10-CM | POA: Diagnosis not present

## 2022-06-28 DIAGNOSIS — Z818 Family history of other mental and behavioral disorders: Secondary | ICD-10-CM | POA: Diagnosis not present

## 2022-06-28 DIAGNOSIS — J45909 Unspecified asthma, uncomplicated: Secondary | ICD-10-CM | POA: Diagnosis present

## 2022-06-28 DIAGNOSIS — F418 Other specified anxiety disorders: Secondary | ICD-10-CM | POA: Diagnosis not present

## 2022-06-28 DIAGNOSIS — G9340 Encephalopathy, unspecified: Secondary | ICD-10-CM | POA: Diagnosis not present

## 2022-06-28 DIAGNOSIS — Y900 Blood alcohol level of less than 20 mg/100 ml: Secondary | ICD-10-CM | POA: Diagnosis present

## 2022-06-28 DIAGNOSIS — G9341 Metabolic encephalopathy: Secondary | ICD-10-CM | POA: Diagnosis present

## 2022-06-28 DIAGNOSIS — F4310 Post-traumatic stress disorder, unspecified: Secondary | ICD-10-CM | POA: Diagnosis present

## 2022-06-28 DIAGNOSIS — F32A Depression, unspecified: Secondary | ICD-10-CM | POA: Diagnosis present

## 2022-06-28 DIAGNOSIS — F1010 Alcohol abuse, uncomplicated: Secondary | ICD-10-CM | POA: Diagnosis not present

## 2022-06-28 DIAGNOSIS — F10139 Alcohol abuse with withdrawal, unspecified: Secondary | ICD-10-CM | POA: Diagnosis present

## 2022-06-28 DIAGNOSIS — Z888 Allergy status to other drugs, medicaments and biological substances status: Secondary | ICD-10-CM | POA: Diagnosis not present

## 2022-06-28 LAB — COMPREHENSIVE METABOLIC PANEL
ALT: 26 U/L (ref 0–44)
AST: 51 U/L — ABNORMAL HIGH (ref 15–41)
Albumin: 4 g/dL (ref 3.5–5.0)
Alkaline Phosphatase: 72 U/L (ref 38–126)
Anion gap: 12 (ref 5–15)
BUN: <5 mg/dL — ABNORMAL LOW (ref 6–20)
CO2: 26 mmol/L (ref 22–32)
Calcium: 9.2 mg/dL (ref 8.9–10.3)
Chloride: 99 mmol/L (ref 98–111)
Creatinine, Ser: 0.71 mg/dL (ref 0.44–1.00)
GFR, Estimated: >60 mL/min (ref >60)
Glucose, Bld: 93 mg/dL (ref 70–99)
Potassium: 2.5 mmol/L — CL (ref 3.5–5.1)
Sodium: 137 mmol/L (ref 135–145)
Total Bilirubin: 1.2 mg/dL (ref 0.3–1.2)
Total Protein: 6.6 g/dL (ref 6.5–8.1)

## 2022-06-28 LAB — BASIC METABOLIC PANEL
Anion gap: 12 (ref 5–15)
BUN: <5 mg/dL — ABNORMAL LOW (ref 6–20)
CO2: 19 mmol/L — ABNORMAL LOW (ref 22–32)
Calcium: 9.5 mg/dL (ref 8.9–10.3)
Chloride: 107 mmol/L (ref 98–111)
Creatinine, Ser: 1.22 mg/dL — ABNORMAL HIGH (ref 0.44–1.00)
GFR, Estimated: >60 mL/min (ref >60)
Glucose, Bld: 99 mg/dL (ref 70–99)
Potassium: 5.8 mmol/L — ABNORMAL HIGH (ref 3.5–5.1)
Sodium: 138 mmol/L (ref 135–145)

## 2022-06-28 LAB — CBC
HCT: 36.6 % (ref 36.0–46.0)
Hemoglobin: 12.6 g/dL (ref 12.0–15.0)
MCH: 36.3 pg — ABNORMAL HIGH (ref 26.0–34.0)
MCHC: 34.4 g/dL (ref 30.0–36.0)
MCV: 105.5 fL — ABNORMAL HIGH (ref 80.0–100.0)
Platelets: 133 10*3/uL — ABNORMAL LOW (ref 150–400)
RBC: 3.47 MIL/uL — ABNORMAL LOW (ref 3.87–5.11)
RDW: 18.1 % — ABNORMAL HIGH (ref 11.5–15.5)
WBC: 5.8 10*3/uL (ref 4.0–10.5)
nRBC: 0.7 % — ABNORMAL HIGH (ref 0.0–0.2)

## 2022-06-28 LAB — MAGNESIUM: Magnesium: 2 mg/dL (ref 1.7–2.4)

## 2022-06-28 MED ORDER — POTASSIUM CHLORIDE 10 MEQ/100ML IV SOLN
10.0000 meq | INTRAVENOUS | Status: AC
  Administered 2022-06-28 (×4): 10 meq via INTRAVENOUS
  Filled 2022-06-28 (×4): qty 100

## 2022-06-28 MED ORDER — POTASSIUM CHLORIDE CRYS ER 20 MEQ PO TBCR
40.0000 meq | EXTENDED_RELEASE_TABLET | Freq: Once | ORAL | Status: AC
  Administered 2022-06-28: 40 meq via ORAL
  Filled 2022-06-28: qty 2

## 2022-06-28 MED ORDER — SODIUM CHLORIDE 0.9 % IV SOLN: INTRAVENOUS | Status: DC

## 2022-06-28 MED ORDER — METOPROLOL TARTRATE 25 MG PO TABS
25.0000 mg | ORAL_TABLET | Freq: Two times a day (BID) | ORAL | Status: DC
  Administered 2022-06-28 (×2): 25 mg via ORAL
  Filled 2022-06-28 (×2): qty 1

## 2022-06-28 NOTE — Progress Notes (Signed)
Dr. Jerral Ralph notified of 2.5 potasium level via secure chat.

## 2022-06-28 NOTE — Progress Notes (Addendum)
PROGRESS NOTE        PATIENT DETAILS Name: Natasha Pope Age: 26 y.o. Sex: female Date of Birth: Sep 03, 1995 Admit Date: 06/26/2022 Admitting Physician Briscoe Deutscher, MD ENI:[id number], Johnella Moloney, MD  Brief Summary: Patient is a 26 y.o.  female with history of alcohol-related seizure disorder, depression/anxiety-who quit drinking approximately 3 days back-she was found confused/diaphoretic-laying in the hallway of a local hotel where she lives and subsequently brought to the ED.  Significant events: 12/5>> admit to [facility]-confusion-possible seizures.  Significant studies: 12/5>> CT head: No acute intracranial process 12/5>> CT C-spine: No fracture/subluxation C-spine. 12/6>> Spot EEG: No seizures  Significant microbiology data: None  Procedures: None  Consults: None  Subjective: Much more awake/alert compared to yesterday.  No further nausea, vomiting or diarrhea.  Asking for diet to be advanced.  Objective: Vitals: Blood pressure (!) 151/90, pulse (!) 122, temperature 98.3 F (36.8 C), temperature source Oral, resp. rate 18, height 5\' 6"  (1.676 m), weight 68 kg, SpO2 99 %.   Exam: Gen Exam:Alert awake-not in any distress HEENT:atraumatic, normocephalic Chest: B/L clear to auscultation anteriorly CVS:S1S2 regular Abdomen:soft non tender, non distended Extremities:no edema Neurology: Non focal Skin: no rash  Pertinent Labs/Radiology:    Latest Ref Rng & Units 06/28/2022    6:11 AM 06/27/2022    5:13 AM 06/26/2022    6:52 PM  CBC  WBC 4.0 - 10.5 K/uL 5.8  7.2  7.0   Hemoglobin 12.0 - 15.0 g/dL 14/11/2021  36.1  44.3   Hematocrit 36.0 - 46.0 % 36.6  34.0  36.6   Platelets 150 - 400 K/uL 133  186  203     Lab Results  Component Value Date   NA 137 06/28/2022   K 2.5 (LL) 06/28/2022   CL 99 06/28/2022   CO2 26 06/28/2022      Assessment/Plan: Acute metabolic encephalopathy Suspect due to postictal state from possible seizures-and  ongoing alcohol withdrawal symptoms. Neuroimaging/EEG negative Overall much improved-more awake and alert today-but still sleepy Ativan per CIWA protocol  EtOH withdrawal Reported last drink 4 days prior to this hospitalization More awake/alert but still mildly tremulous. Continue Ativan per CIWA protocol Counseled grandmother at bedside-patient has had numerous attempts at detox/rehab and has had multiple relapses.  Severe Hypokalemia Due to EtOH use Replete-recheck later today  Hypomagnesemia Due to EtOH use Repleted-recheck periodically  Nausea/vomiting/diarrhea Resolved Advance to regular diet  History of alcohol-related seizures Per patient-her primary neurologist took her off seizure medications last year. EEG/neuroimaging negative-no indication for AEDs.  Anxiety/depression Continue Seroquel/trazodone  BMI: Estimated body mass index is 24.21 kg/m as calculated from the following:   Height as of this encounter: 5\' 6"  (1.676 m).   Weight as of this encounter: 68 kg.   Code status:   Code Status: Full Code   DVT Prophylaxis: enoxaparin (LOVENOX) injection 40 mg Start: 06/27/22 0800   Family Communication: Grandmother at bedside   Disposition Plan: Status is: Observation The patient will require care spanning > 2 midnights and should be moved to inpatient because: Alcohol withdrawal-possible seizure-now with severe hypokalemia-getting electrolytes repleted.   Planned Discharge Destination:Home likely in the next 1-2 days.   Diet: Diet Order             Diet regular Room service appropriate? Yes; Fluid consistency: Thin  Diet effective now  Antimicrobial agents: Anti-infectives (From admission, onward)    None        MEDICATIONS: Scheduled Meds:  enoxaparin (LOVENOX) injection  40 mg Subcutaneous Q24H   folic acid  1 mg Oral Daily   LORazepam  0-4 mg Intravenous Q6H   Followed by   Melene Muller ON 06/29/2022] LORazepam   0-4 mg Intravenous Q12H   multivitamin with minerals  1 tablet Oral Daily   QUEtiapine  50 mg Oral QHS   sodium chloride flush  3 mL Intravenous Q12H   thiamine  100 mg Oral Daily   Or   thiamine  100 mg Intravenous Daily   traZODone  100 mg Oral QHS   Continuous Infusions:  potassium chloride 10 mEq (06/28/22 0952)   PRN Meds:.acetaminophen **OR** acetaminophen, LORazepam **OR** LORazepam, ondansetron (ZOFRAN) IV, oxyCODONE   I have personally reviewed following labs and imaging studies  LABORATORY DATA: CBC: Recent Labs  Lab 06/26/22 1852 06/27/22 0513 06/28/22 0611  WBC 7.0 7.2 5.8  HGB 12.8 11.8* 12.6  HCT 36.6 34.0* 36.6  MCV 103.7* 104.0* 105.5*  PLT 203 186 133*     Basic Metabolic Panel: Recent Labs  Lab 06/26/22 1852 06/27/22 0237 06/28/22 0611  NA 136 135 137  K 2.9* 3.3* 2.5*  CL 94* 94* 99  CO2 24 26 26   GLUCOSE 164* 118* 93  BUN 6 6 <5*  CREATININE 0.77 0.77 0.71  CALCIUM 9.0 8.7* 9.2  MG  --  1.5* 2.0  PHOS  --  3.2  --      GFR: Estimated Creatinine Clearance: 100.6 mL/min (by C-G formula based on SCr of 0.71 mg/dL).  Liver Function Tests: Recent Labs  Lab 06/26/22 1852 06/27/22 0237 06/28/22 0611  AST 65* 46* 51*  ALT 30 25 26   ALKPHOS 86 76 72  BILITOT 1.5* 1.3* 1.2  PROT 7.3 6.7 6.6  ALBUMIN 4.3 4.2 4.0    No results for input(s): "LIPASE", "AMYLASE" in the last 168 hours. No results for input(s): "AMMONIA" in the last 168 hours.  Coagulation Profile: No results for input(s): "INR", "PROTIME" in the last 168 hours.  Cardiac Enzymes: No results for input(s): "CKTOTAL", "CKMB", "CKMBINDEX", "TROPONINI" in the last 168 hours.  BNP (last 3 results) No results for input(s): "PROBNP" in the last 8760 hours.  Lipid Profile: No results for input(s): "CHOL", "HDL", "LDLCALC", "TRIG", "CHOLHDL", "LDLDIRECT" in the last 72 hours.  Thyroid Function Tests: No results for input(s): "TSH", "T4TOTAL", "FREET4", "T3FREE",  "THYROIDAB" in the last 72 hours.  Anemia Panel: No results for input(s): "VITAMINB12", "FOLATE", "FERRITIN", "TIBC", "IRON", "RETICCTPCT" in the last 72 hours.  Urine analysis:    Component Value Date/Time   COLORURINE AMBER (A) 06/27/2022 0508   APPEARANCEUR HAZY (A) 06/27/2022 0508   LABSPEC 1.024 06/27/2022 0508   PHURINE 7.0 06/27/2022 0508   GLUCOSEU NEGATIVE 06/27/2022 0508   GLUCOSEU NEGATIVE 12/21/2020 1542   HGBUR NEGATIVE 06/27/2022 0508   BILIRUBINUR SMALL (A) 06/27/2022 0508   BILIRUBINUR Negative 12/24/2018 1147   KETONESUR 20 (A) 06/27/2022 0508   PROTEINUR 100 (A) 06/27/2022 0508   UROBILINOGEN 0.2 12/21/2020 1542   NITRITE NEGATIVE 06/27/2022 0508   LEUKOCYTESUR NEGATIVE 06/27/2022 0508    Sepsis Labs: Lactic Acid, Venous    Component Value Date/Time   LATICACIDVEN 1.5 06/26/2022 2047    MICROBIOLOGY: No results found for this or any previous visit (from the past 240 hour(s)).  RADIOLOGY STUDIES/RESULTS: EEG adult  Result Date: 06/27/2022 2048, MD  06/27/2022  4:38 PM [facility] Routine EEG Report Demographics: Patient Name:   Genevie Annwashington, Tennie Date of Birth:   May 17, 1996 Identification Number:   MRN - [id number] Study Times: Duration:   23 minutes Indication(s): Spells, Eval for Seizures, Seizures, Syncope Technical Summary: This EEG was performed utilizing standard International 10-20 System of electrode placement. Data were obtained, stored, and interpreted utilizing referential montage recording, with reformatting to longitudinal, transverse bipolar, and referential montages as necessary for interpretation. State(s):       Awake Activation Procedures: Hyperventilation: Performed : Photic Stimulation: Performed : Photic stimulation was performed at frequencies between 1 and 21 Hz demonstrating symmetric bioccipital driving responses over a range of frequencies. EEG Description: During the  waking state, the predominant posterior resting frequency was a rhythmic, symmetric, bioccipital 7-8 Hz, 20-40 microvolt rhythm.The remaining background activities consisted of diffuse, polymorphic, low to moderate amplitude, mixed frequency alpha and beta range patterns. Drowsiness and sleep were not demonstrated. Severe myogenic and movement artifact were present throughout the recording and precluded a more thorough interpretation of the data. Impression: Abnormal EEG due to:   1) Theta range background slowing Clinical Correlation: This EEG is suggestive of a mild encephalopathy, but is nonspecific as to etiology. The absence of epileptiform abnormalities does not preclude a clinical diagnosis of seizures. Severe artifact precluded a more thorough interpretation of the data. If clinical concern for seizure persists, suggest repetition of the study when the patient is in a more relaxed state. Petra KubaWilliam (Bill) Loistine Chanceotson II MD- American Board of Psychiatry and Neurology (ABPN); American Board of Clinical Neurophysiology  (ABCN) [facility] For Inpatient follow-up with [facility] physician please call [facility] [phone]. This is not an outpatient service. Post hospital discharge, please contact hospital directly.   CT Head Wo Contrast  Result Date: 06/27/2022 CLINICAL DATA:  Seizure. EXAM: CT HEAD WITHOUT CONTRAST CT CERVICAL SPINE WITHOUT CONTRAST TECHNIQUE: Multidetector CT imaging of the head and cervical spine was performed following the standard protocol without intravenous contrast. Multiplanar CT image reconstructions of the cervical spine were also generated. RADIATION DOSE REDUCTION: This exam was performed according to the departmental dose-optimization program which includes automated exposure control, adjustment of the mA and/or kV according to patient size and/or use of iterative reconstruction technique. COMPARISON:  02/27/2021. FINDINGS: CT HEAD FINDINGS Brain: No acute intracranial  hemorrhage, midline shift or mass effect. No extra-axial fluid collection. Gray-white matter differentiation is within normal limits. No hydrocephalus. Vascular: No hyperdense vessel or unexpected calcification. Skull: Normal. Negative for fracture or focal lesion. Sinuses/Orbits: No acute finding. Other: None. CT CERVICAL SPINE FINDINGS Alignment: Normal.  There is loss of normal cervical lordosis. Skull base and vertebrae: No acute fracture. No primary bone lesion or focal pathologic process. Soft tissues and spinal canal: No prevertebral fluid or swelling. No visible canal hematoma. Disc levels: Mild intervertebral disc space narrowing and osteophyte formation at C5-C6. Upper chest: Negative. Other: None. IMPRESSION: 1. No acute intracranial process. 2. No acute fracture or subluxation in the cervical spine. Electronically Signed   By: Thornell SartoriusLaura  Taylor M.D.   On: 06/27/2022 00:32   CT Cervical Spine Wo Contrast  Result Date: 06/27/2022 CLINICAL DATA:  Seizure. EXAM: CT HEAD WITHOUT CONTRAST CT CERVICAL SPINE WITHOUT CONTRAST TECHNIQUE: Multidetector CT imaging of the head and cervical spine was performed following the standard protocol without intravenous contrast. Multiplanar CT image reconstructions of the cervical spine were also generated. RADIATION DOSE REDUCTION: This exam was performed according to the departmental dose-optimization program which includes  automated exposure control, adjustment of the mA and/or kV according to patient size and/or use of iterative reconstruction technique. COMPARISON:  02/27/2021. FINDINGS: CT HEAD FINDINGS Brain: No acute intracranial hemorrhage, midline shift or mass effect. No extra-axial fluid collection. Gray-white matter differentiation is within normal limits. No hydrocephalus. Vascular: No hyperdense vessel or unexpected calcification. Skull: Normal. Negative for fracture or focal lesion. Sinuses/Orbits: No acute finding. Other: None. CT CERVICAL SPINE FINDINGS  Alignment: Normal.  There is loss of normal cervical lordosis. Skull base and vertebrae: No acute fracture. No primary bone lesion or focal pathologic process. Soft tissues and spinal canal: No prevertebral fluid or swelling. No visible canal hematoma. Disc levels: Mild intervertebral disc space narrowing and osteophyte formation at C5-C6. Upper chest: Negative. Other: None. IMPRESSION: 1. No acute intracranial process. 2. No acute fracture or subluxation in the cervical spine. Electronically Signed   By: Thornell Sartorius M.D.   On: 06/27/2022 00:32     LOS: 0 days   Jeoffrey Massed, MD  [facility]    To contact the attending provider between 7A-7P or the covering provider during after hours 7P-7A, please log into the web site [email or web address] and access using universal Linda password for that web site. If you do not have the password, please call the hospital operator.  06/28/2022, 10:24 AM

## 2022-06-28 NOTE — TOC CAGE-AID Note (Signed)
Transition of Care [facility]) - CAGE-AID Screening   Patient Details  Name: Natasha Pope MRN: [id number] Date of Birth: 1995/10/27  [facility]) CM/SW Contact:    Kermit Balo, RN Phone Number: 06/28/2022, 12:29 PM   Clinical Narrative: Pt provided inpatient/ outpatient alcohol counseling information.   CAGE-AID Screening:    Have You Ever Felt You Ought to Cut Down on Your Drinking or Drug Use?: No Have People Annoyed You By Critizing Your Drinking Or Drug Use?: No Have You Felt Bad Or Guilty About Your Drinking Or Drug Use?: No Have You Ever Had a Drink or Used Drugs First Thing In The Morning to Steady Your Nerves or to Get Rid of a Hangover?: No CAGE-AID Score: 0  Substance Abuse Education Offered: Yes  Substance abuse interventions: Patient Counseling, Transport planner

## 2022-06-28 NOTE — Plan of Care (Signed)

## 2022-06-28 NOTE — Progress Notes (Signed)
3382 - Notifed by CMT to check on pt d/t tachycardia on monitor. Pt's bedside RN was in the room with the pt. Pt was calmly sitting on bed with HR of 140s. Pt received scheduled ativan 0621. Secure chat to Dr. Jerral Ralph with immediate response to get EKG - EKG read mild sinus tach. MD ordered to monitor and mobilize; 1054 Secure chat to Dr. Jerral Ralph to notify of HR of 180s. MD aware. Scheduled ativan given at 1213 for CIWA 5 d/t HA. 1342 - HR back up in 180s. MD ordered IV fluid & lopressor and BMET due at 1600 to reassess K. Will continue to monitor and update as needed.

## 2022-06-28 NOTE — Telephone Encounter (Signed)
Caller States: -pt is currently hospitalized -May be released tomorrow 06/29/22 -She needs to talk to patients daughter very quickly to share "some things he probably needs to know." -She is going to try to get patient in to a rehab facility.   Caller Requests: -return phone call from PCP

## 2022-06-28 NOTE — TOC Initial Note (Signed)
Transition of Care [facility]) - Initial/Assessment Note    Patient Details  Name: Natasha Pope MRN: [id number] Date of Birth: 08/04/1995  [facility]) CM/SW Contact:    Kermit Balo, RN Phone Number: 06/28/2022, 12:30 PM  Clinical Narrative:                 Pt is from home alone but states her grandmother can check on her. She uses Pharmacist, community for transportation.  She manages her own medications and denies any issues.  Pt states her grandmother will provide transport home when discharged.   Expected Discharge Plan: Home/Self Care Barriers to Discharge: Continued Medical Work up   Patient Goals and CMS Choice        Expected Discharge Plan and Services Expected Discharge Plan: Home/Self Care   Discharge Planning Services: CM Consult   Living arrangements for the past 2 months: Single Family Home                                      Prior Living Arrangements/Services Living arrangements for the past 2 months: Single Family Home Lives with:: Self Patient language and need for interpreter reviewed:: Yes Do you feel safe going back to the place where you live?: Yes            Criminal Activity/Legal Involvement Pertinent to Current Situation/Hospitalization: No - Comment as needed  Activities of Daily Living      Permission Sought/Granted                  Emotional Assessment Appearance:: Appears stated age Attitude/Demeanor/Rapport: Engaged Affect (typically observed): Accepting Orientation: : Oriented to Self, Oriented to Place, Oriented to  Time, Oriented to Situation Alcohol / Substance Use: Alcohol Use Psych Involvement: No (comment)  Admission diagnosis:  Seizure (HCC) [R56.9] Acute encephalopathy [G93.40] Patient Active Problem List   Diagnosis Date Noted   Acute encephalopathy 06/27/2022   Nausea vomiting and diarrhea 06/27/2022   Depression with anxiety 06/27/2022   Snoring 04/17/2022   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  04/17/2022   Memory loss 04/03/2022   PTSD (post-traumatic stress disorder) 03/19/2022   Hypokalemia 02/28/2021   Alcohol withdrawal delirium (HCC) 02/28/2021   Auditory hallucinations 01/24/2021   Visual hallucination 01/24/2021   Chronic abdominal pain 01/24/2021   Transient alteration of awareness 01/24/2021   Seizures (HCC) 10/03/2020   Depression 10/03/2020   Delusional disorder (HCC)    Cocaine use disorder, moderate, in early remission (HCC) 08/13/2019   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 # [address] [place] - [address] [place] [address] Phone: [phone] Fax: [phone]  [facility] - [place], [place] - [address], [address] [place] [address] Phone: [phone] Fax: [phone]     Social Determinants of  Health (SDOH) Interventions    Readmission Risk Interventions     No data to display

## 2022-06-29 DIAGNOSIS — E876 Hypokalemia: Secondary | ICD-10-CM | POA: Diagnosis not present

## 2022-06-29 DIAGNOSIS — F1010 Alcohol abuse, uncomplicated: Secondary | ICD-10-CM | POA: Diagnosis not present

## 2022-06-29 DIAGNOSIS — G9340 Encephalopathy, unspecified: Secondary | ICD-10-CM | POA: Diagnosis not present

## 2022-06-29 DIAGNOSIS — F418 Other specified anxiety disorders: Secondary | ICD-10-CM | POA: Diagnosis not present

## 2022-06-29 LAB — BASIC METABOLIC PANEL
Anion gap: 12 (ref 5–15)
BUN: <5 mg/dL — ABNORMAL LOW (ref 6–20)
CO2: 22 mmol/L (ref 22–32)
Calcium: 9.2 mg/dL (ref 8.9–10.3)
Chloride: 105 mmol/L (ref 98–111)
Creatinine, Ser: 0.69 mg/dL (ref 0.44–1.00)
GFR, Estimated: >60 mL/min (ref >60)
Glucose, Bld: 88 mg/dL (ref 70–99)
Potassium: 3.6 mmol/L (ref 3.5–5.1)
Sodium: 139 mmol/L (ref 135–145)

## 2022-06-29 LAB — MAGNESIUM: Magnesium: 1.7 mg/dL (ref 1.7–2.4)

## 2022-06-29 LAB — TSH: TSH: 5.813 u[IU]/mL — ABNORMAL HIGH (ref 0.350–4.500)

## 2022-06-29 MED ORDER — PROPRANOLOL HCL 20 MG PO TABS
20.0000 mg | ORAL_TABLET | Freq: Three times a day (TID) | ORAL | Status: DC
  Filled 2022-06-29: qty 1

## 2022-06-29 MED ORDER — VITAMIN B-1 100 MG PO TABS: 100.0000 mg | ORAL_TABLET | Freq: Every day | ORAL | 0 refills | Status: DC

## 2022-06-29 MED ORDER — MAGNESIUM SULFATE 2 GM/50ML IV SOLN: 2.0000 g | Freq: Once | INTRAVENOUS | Status: DC

## 2022-06-29 MED ORDER — ADULT MULTIVITAMIN W/MINERALS CH: 1.0000 | ORAL_TABLET | Freq: Every day | ORAL | 0 refills | Status: DC

## 2022-06-29 NOTE — Progress Notes (Signed)
Seen and examined Feesl much better Awake/alert No tremors Tele-neg for arrythmias HR not elevated on walking this am Wants to go home Does NOT want to go inpatient drug rehab Claims she follows w a psych MD-who is attempting to titrate down her meds Grandmother at bedside Will d/c home today See d/c summary for details

## 2022-06-29 NOTE — Discharge Instructions (Signed)
Seizure precautions: °Per [place] DMV statutes, patients with seizures are not allowed to drive until they have been seizure-free for six months and cleared by a physician  °  °Use caution when using heavy equipment or power tools. Avoid working on ladders or at heights. Take showers instead of baths. Ensure the water temperature is not too high on the home water heater. Do not go swimming alone. Do not lock yourself in a room alone (i.e. bathroom). When caring for infants or small children, sit down when holding, feeding, or changing them to minimize risk of injury to the child in the event you have a seizure. Maintain good sleep hygiene. Avoid alcohol.  °  °If patient has another seizure, call 911 and bring them back to the ED if: °A.  The seizure lasts longer than 5 minutes.      °B.  The patient doesn't wake shortly after the seizure or has new problems such as difficulty seeing, speaking or moving following the seizure °C.  The patient was injured during the seizure °D.  The patient has a temperature over 102 F (39C) °E.  The patient vomited during the seizure and now is having trouble breathing °   °During the Seizure °  °- First, ensure adequate ventilation and place patients on the floor on their left side  °Loosen clothing around the neck and ensure the airway is patent. If the patient is clenching the teeth, do not force the mouth open with any object as this can cause severe damage °- Remove all items from the surrounding that can be hazardous. The patient may be oblivious to what's happening and may not even know what he or she is doing. °If the patient is confused and wandering, either gently guide him/her away and block access to outside areas °- Reassure the individual and be comforting °- Call 911. In most cases, the seizure ends before EMS arrives. However, there are cases when seizures may last over 3 to 5 minutes. Or the individual may have developed breathing difficulties or severe  injuries. If a pregnant patient or a person with diabetes develops a seizure, it is prudent to call an ambulance. °- Finally, if the patient does not regain full consciousness, then call EMS. Most patients will remain confused for about 45 to 90 minutes after a seizure, so you must use judgment in calling for help. °- Avoid restraints but make sure the patient is in a bed with padded side rails °- Place the individual in a lateral position with the neck slightly flexed; this will help the saliva drain from the mouth and prevent the tongue from falling backward °- Remove all nearby furniture and other hazards from the area °- Provide verbal assurance as the individual is regaining consciousness °- Provide the patient with privacy if possible °- Call for help and start treatment as ordered by the caregiver °  ° After the Seizure (Postictal Stage) °  °After a seizure, most patients experience confusion, fatigue, muscle pain and/or a headache. Thus, one should permit the individual to sleep. For the next few days, reassurance is essential. Being calm and helping reorient the person is also of importance. °  °Most seizures are painless and end spontaneously. Seizures are not harmful to others but can lead to complications such as stress on the lungs, brain and the heart. Individuals with prior lung problems may develop labored breathing and respiratory distress.  °  °

## 2022-06-29 NOTE — Discharge Summary (Signed)
PATIENT DETAILS Name: Natasha Pope Age: 26 y.o. Sex: female Date of Birth: 1996-02-24 MRN: [id number]. Admitting Physician: Briscoe Deutscher, MD WUJ:[id number], Johnella Moloney, MD  Admit Date: 06/26/2022 Discharge date: 06/29/2022  Recommendations for Outpatient Follow-up:  Follow up with PCP in 1-2 weeks Please obtain CMP/CBC in one week TSH is mildly elevated-stable for repeat TSH in 6 weeks  Admitted From:  Home  Disposition: Home   Discharge Condition: fair  CODE STATUS:   Code Status: Full Code   Diet recommendation:  Diet Order             Diet - low sodium heart healthy           Diet regular Room service appropriate? Yes; Fluid consistency: Thin  Diet effective now                    Brief Summary: Patient is a 26 y.o.  female with history of alcohol-related seizure disorder, depression/anxiety-who quit drinking approximately 3 days back-she was found confused/diaphoretic-laying in the hallway of a local hotel where she lives and subsequently brought to the ED.   Significant events: 12/5>> admit to [facility]-confusion-possible seizures.   Significant studies: 12/5>> CT head: No acute intracranial process 12/5>> CT C-spine: No fracture/subluxation C-spine. 12/6>> Spot EEG: No seizures   Significant microbiology data: None   Procedures: None   Consults: None  Brief Hospital Course: Acute metabolic encephalopathy Suspect due to postictal state from possible seizures-and ongoing alcohol withdrawal symptoms. Neuroimaging/EEG negative Overall much improved-completely awake/alert/oriented today Treated w Ativan per [facility] protocol   EtOH withdrawal Reported last drink 5 days prior to this hospitalization No further tremors Completely awake/alert  Treated w Ativan per [facility] protocol Counseled extensively- grandmother at bedside-patient has had numerous attempts at detox/rehab and has had multiple relapses. DOESN'T NOT WANT TO GO TO INPATIENT DRUG  REHAB! She wants to go home today and follow up with her psychiatrist [facility] team has provided outpatient ETOH resources   Severe Hypokalemia Due to EtOH use Repleted   Hypomagnesemia Due to EtOH use Repleted   Nausea/vomiting/diarrhea Resolved Advance to regular diet   History of alcohol-related seizures Per patient-her primary neurologist took her off seizure medications last year. EEG/neuroimaging negative-no indication for AEDs. No driving-she is aware of seizure precautions   Anxiety/depression She apparently takes multiple medications-see home list Per patient her pyschiatrist is trying to taper her off some medications Will continue her usual medications as previous she will follow with her primary psychiatrist for continued care  SVT/Sinus Tachy Brief episodes on 12/7 None overnight and none this am Per patient she is chronically on propranolol-which will be resumed on d/c. Will not continue metoprolol on d/c TSH is mildly elevated-stable for repeat TSH in 6 weeks   BMI: Estimated body mass index is 24.21 kg/m as calculated from the following:   Height as of this encounter:  (1.676 m).   Weight as of this encounter: 68 kg.    Discharge Diagnoses:  Principal Problem:   Acute encephalopathy Active Problems:   Alcohol abuse   Seizures (HCC)   Hypokalemia   Nausea vomiting and diarrhea   Depression with anxiety   Discharge Instructions:  Activity:  As tolerated    Discharge Instructions     Call MD for:  extreme fatigue   Complete by: As directed    Call MD for:  persistant dizziness or light-headedness   Complete by: As directed    Diet - low sodium  heart healthy   Complete by: As directed    Discharge instructions   Complete by: As directed    Follow with Primary MD  Lula OlszewskiMorrison, Ryan G, MD in 1-2 weeks  Please get a complete blood count and chemistry panel checked by your Primary MD at your next visit, and again as instructed by your Primary  MD.  Get Medicines reviewed and adjusted: Please take all your medications with you for your next visit with your Primary MD  Laboratory/radiological data: Please request your Primary MD to go over all hospital tests and procedure/radiological results at the follow up, please ask your Primary MD to get all Hospital records sent to his/her office.  In some cases, they will be blood work, cultures and biopsy results pending at the time of your discharge. Please request that your primary care M.D. follows up on these results.  Also Note the following: If you experience worsening of your admission symptoms, develop shortness of breath, life threatening emergency, suicidal or homicidal thoughts you must seek medical attention immediately by calling 911 or calling your MD immediately  if symptoms less severe.  You must read complete instructions/literature along with all the possible adverse reactions/side effects for all the Medicines you take and that have been prescribed to you. Take any new Medicines after you have completely understood and accpet all the possible adverse reactions/side effects.   Do not drive when taking Pain medications or sleeping medications (Benzodaizepines)  Do not take more than prescribed Pain, Sleep and Anxiety Medications. It is not advisable to combine anxiety,sleep and pain medications without talking with your primary care practitioner  Special Instructions: If you have smoked or chewed Tobacco  in the last 2 yrs please stop smoking, stop any regular Alcohol  and or any Recreational drug use.  Wear Seat belts while driving.  Please note: You were cared for by a hospitalist during your hospital stay. Once you are discharged, your primary care physician will handle any further medical issues. Please note that NO REFILLS for any discharge medications will be authorized once you are discharged, as it is imperative that you return to your primary care physician (or  establish a relationship with a primary care physician if you do not have one) for your post hospital discharge needs so that they can reassess your need for medications and monitor your lab values.     Seizure precautions: [place], patients with seizures are not allowed to drive until they have been seizure-free for six months and cleared by a physician    Use caution when using heavy equipment or power tools. Avoid working on ladders or at heights. Take showers instead of baths. Ensure the water temperature is not too high on the home water heater. Do not go swimming alone. Do not lock yourself in a room alone (i.e. bathroom). When caring for infants or small children, sit down when holding, feeding, or changing them to minimize risk of injury to the child in the event you have a seizure. Maintain good sleep hygiene. Avoid alcohol.    If patient has another seizure, call 911 and bring them back to the ED if: A.  The seizure lasts longer than 5 minutes.      B.  The patient doesn't wake shortly after the seizure or has new problems such as difficulty seeing, speaking or moving following the seizure C.  The patient was injured during the seizure D.  The patient has a temperature over  102 F (39C) E.  The patient vomited during the seizure and now is having trouble breathing    During the Seizure   - First, ensure adequate ventilation and place patients on the floor on their left side  Loosen clothing around the neck and ensure the airway is patent. If the patient is clenching the teeth, do not force the mouth open with any object as this can cause severe damage - Remove all items from the surrounding that can be hazardous. The patient may be oblivious to what's happening and may not even know what he or she is doing. If the patient is confused and wandering, either gently guide him/her away and block access to outside areas - Reassure the individual and be comforting -  Call 911. In most cases, the seizure ends before EMS arrives. However, there are cases when seizures may last over 3 to 5 minutes. Or the individual may have developed breathing difficulties or severe injuries. If a pregnant patient or a person with diabetes develops a seizure, it is prudent to call an ambulance. - Finally, if the patient does not regain full consciousness, then call EMS. Most patients will remain confused for about 45 to 90 minutes after a seizure, so you must use judgment in calling for help. - Avoid restraints but make sure the patient is in a bed with padded side rails - Place the individual in a lateral position with the neck slightly flexed; this will help the saliva drain from the mouth and prevent the tongue from falling backward - Remove all nearby furniture and other hazards from the area - Provide verbal assurance as the individual is regaining consciousness - Provide the patient with privacy if possible - Call for help and start treatment as ordered by the caregiver    After the Seizure (Postictal Stage)   After a seizure, most patients experience confusion, fatigue, muscle pain and/or a headache. Thus, one should permit the individual to sleep. For the next few days, reassurance is essential. Being calm and helping reorient the person is also of importance.   Most seizures are painless and end spontaneously. Seizures are not harmful to others but can lead to complications such as stress on the lungs, brain and the heart. Individuals with prior lung problems may develop labored breathing and respiratory distress.    Increase activity slowly   Complete by: As directed       Allergies as of 06/29/2022       Reactions   Lamotrigine Hives        Medication List     STOP taking these medications    fexofenadine-pseudoephedrine 60-120 MG 12 hr tablet Commonly known as: Allegra-D Allergy & Congestion   rizatriptan 10 MG tablet Commonly known as: MAXALT        TAKE these medications    acamprosate 333 MG tablet Commonly known as: CAMPRAL Take 2 tablets (666 mg total) by mouth 3 (three) times daily with meals.   Auvelity 45-105 MG Tbcr Generic drug: Dextromethorphan-buPROPion ER TAKE ONE (1) TABLET BY MOUTH TWICE A DAY   busPIRone 15 MG tablet Commonly known as: BUSPAR Take 1 tablet (15 mg total) by mouth 3 (three) times daily.   DULoxetine 30 MG capsule Commonly known as: CYMBALTA TAKE ONE CAPSULE BY MOUTH TWICE DAILY   gabapentin 300 MG capsule Commonly known as: NEURONTIN Take 1 capsule (300 mg total) by mouth 3 (three) times daily. What changed: Another medication with the same name was removed.  Continue taking this medication, and follow the directions you see here.   methocarbamol 500 MG tablet Commonly known as: ROBAXIN Take 1 tablet (500 mg total) by mouth 4 (four) times daily.   multivitamin with minerals Tabs tablet Take 1 tablet by mouth daily.   naltrexone 50 MG tablet Commonly known as: DEPADE Take 1 tablet (50 mg total) by mouth daily.   norethindrone-ethinyl estradiol-FE 1-20 MG-MCG tablet Commonly known as: Hailey FE 1/20 Take 1 tablet by mouth daily.   propranolol 20 MG tablet Commonly known as: INDERAL TAKE ONE TABLET BY MOUTH THREE TIMES DAILY   QUEtiapine 50 MG tablet Commonly known as: SEROQUEL TAKE ONE TABLET BY MOUTH DAILY AT BEDTIME   thiamine 100 MG tablet Commonly known as: Vitamin B-1 Take 1 tablet (100 mg total) by mouth daily.   traZODone 100 MG tablet Commonly known as: DESYREL TAKE ONE TABLET BY MOUTH DAILY AT BEDTIME        Follow-up Information     [name], MD. Schedule an appointment as soon as possible for a visit in 1 week(s).   Specialty: Internal Medicine Contact information: [address] [place] [address] [phone]                Allergies  Allergen Reactions   Lamotrigine Hives     Other Procedures/Studies: EEG adult  Result  Date: 06/27/2022 Verdis Prime, MD     06/27/2022  4:38 PM [facility] Routine EEG Report Demographics: Patient Name:   candance, bohlman Date of Birth:   02/21/1996 Identification Number:   MRN - [id number] Study Times: Duration:   23 minutes Indication(s): Spells, Eval for Seizures, Seizures, Syncope Technical Summary: This EEG was performed utilizing standard International 10-20 System of electrode placement. Data were obtained, stored, and interpreted utilizing referential montage recording, with reformatting to longitudinal, transverse bipolar, and referential montages as necessary for interpretation. State(s):       Awake Activation Procedures: Hyperventilation: Performed : Photic Stimulation: Performed : Photic stimulation was performed at frequencies between 1 and 21 Hz demonstrating symmetric bioccipital driving responses over a range of frequencies. EEG Description: During the waking state, the predominant posterior resting frequency was a rhythmic, symmetric, bioccipital 7-8 Hz, 20-40 microvolt rhythm.The remaining background activities consisted of diffuse, polymorphic, low to moderate amplitude, mixed frequency alpha and beta range patterns. Drowsiness and sleep were not demonstrated. Severe myogenic and movement artifact were present throughout the recording and precluded a more thorough interpretation of the data. Impression: Abnormal EEG due to:   1) Theta range background slowing Clinical Correlation: This EEG is suggestive of a mild encephalopathy, but is nonspecific as to etiology. The absence of epileptiform abnormalities does not preclude a clinical diagnosis of seizures. Severe artifact precluded a more thorough interpretation of the data. If clinical concern for seizure persists, suggest repetition of the study when the patient is in a more relaxed state. Petra Kuba) Loistine Chance II MD- American Board of Psychiatry and Neurology (ABPN);  American Board of Clinical Neurophysiology  (ABCN) [facility] For Inpatient follow-up with [facility] physician please call [facility] [phone]. This is not an outpatient service. Post hospital discharge, please contact hospital directly.   CT Head Wo Contrast  Result Date: 06/27/2022 CLINICAL DATA:  Seizure. EXAM: CT HEAD WITHOUT CONTRAST CT CERVICAL SPINE WITHOUT CONTRAST TECHNIQUE: Multidetector CT imaging of the head and cervical spine was performed following the standard protocol without intravenous contrast. Multiplanar CT image reconstructions of the cervical spine were also  generated. RADIATION DOSE REDUCTION: This exam was performed according to the departmental dose-optimization program which includes automated exposure control, adjustment of the mA and/or kV according to patient size and/or use of iterative reconstruction technique. COMPARISON:  02/27/2021. FINDINGS: CT HEAD FINDINGS Brain: No acute intracranial hemorrhage, midline shift or mass effect. No extra-axial fluid collection. Gray-white matter differentiation is within normal limits. No hydrocephalus. Vascular: No hyperdense vessel or unexpected calcification. Skull: Normal. Negative for fracture or focal lesion. Sinuses/Orbits: No acute finding. Other: None. CT CERVICAL SPINE FINDINGS Alignment: Normal.  There is loss of normal cervical lordosis. Skull base and vertebrae: No acute fracture. No primary bone lesion or focal pathologic process. Soft tissues and spinal canal: No prevertebral fluid or swelling. No visible canal hematoma. Disc levels: Mild intervertebral disc space narrowing and osteophyte formation at C5-C6. Upper chest: Negative. Other: None. IMPRESSION: 1. No acute intracranial process. 2. No acute fracture or subluxation in the cervical spine. Electronically Signed   By: Thornell Sartorius M.D.   On: 2022-07-02 00:32   CT Cervical Spine Wo Contrast  Result Date: 07/02/2022 CLINICAL DATA:  Seizure. EXAM: CT HEAD  WITHOUT CONTRAST CT CERVICAL SPINE WITHOUT CONTRAST TECHNIQUE: Multidetector CT imaging of the head and cervical spine was performed following the standard protocol without intravenous contrast. Multiplanar CT image reconstructions of the cervical spine were also generated. RADIATION DOSE REDUCTION: This exam was performed according to the departmental dose-optimization program which includes automated exposure control, adjustment of the mA and/or kV according to patient size and/or use of iterative reconstruction technique. COMPARISON:  02/27/2021. FINDINGS: CT HEAD FINDINGS Brain: No acute intracranial hemorrhage, midline shift or mass effect. No extra-axial fluid collection. Gray-white matter differentiation is within normal limits. No hydrocephalus. Vascular: No hyperdense vessel or unexpected calcification. Skull: Normal. Negative for fracture or focal lesion. Sinuses/Orbits: No acute finding. Other: None. CT CERVICAL SPINE FINDINGS Alignment: Normal.  There is loss of normal cervical lordosis. Skull base and vertebrae: No acute fracture. No primary bone lesion or focal pathologic process. Soft tissues and spinal canal: No prevertebral fluid or swelling. No visible canal hematoma. Disc levels: Mild intervertebral disc space narrowing and osteophyte formation at C5-C6. Upper chest: Negative. Other: None. IMPRESSION: 1. No acute intracranial process. 2. No acute fracture or subluxation in the cervical spine. Electronically Signed   By: Thornell Sartorius M.D.   On: 02-Jul-2022 00:32     TODAY-DAY OF DISCHARGE:  Subjective:   [name] today has no headache,no chest abdominal pain,no new weakness tingling or numbness, feels much better wants to go home today.   Objective:   Blood pressure (!) 136/99, pulse 91, temperature 99.1 F (37.3 C), temperature source Oral, resp. rate 18, height 5\' 6"  (1.676 m), weight 68 kg, SpO2 100 %.  Intake/Output Summary (Last 24 hours) at 06/29/2022 0758 Last data  filed at 06/29/2022 0200 Gross per 24 hour  Intake 2015.12 ml  Output --  Net 2015.12 ml   Filed Weights   06/26/22 1833  Weight: 68 kg    Exam: Awake Alert, Oriented *3, No new F.N deficits, Normal affect [place].AT,PERRAL Supple Neck,No JVD, No cervical lymphadenopathy appriciated.  Symmetrical Chest wall movement, Good air movement bilaterally, CTAB RRR,No Gallops,Rubs or new Murmurs, No Parasternal Heave +ve B.Sounds, Abd Soft, Non tender, No organomegaly appriciated, No rebound -guarding or rigidity. No Cyanosis, Clubbing or edema, No new Rash or bruise   PERTINENT RADIOLOGIC STUDIES: EEG adult  Result Date: 07/02/2022 14/12/2021, MD     07-02-22  4:38 PM [facility] Routine EEG Report Demographics: Patient Name:   Natasha Pope, Natasha Pope Date of Birth:   Jan 19, 1996 Identification Number:   MRN - [id number] Study Times: Duration:   23 minutes Indication(s): Spells, Eval for Seizures, Seizures, Syncope Technical Summary: This EEG was performed utilizing standard International 10-20 System of electrode placement. Data were obtained, stored, and interpreted utilizing referential montage recording, with reformatting to longitudinal, transverse bipolar, and referential montages as necessary for interpretation. State(s):       Awake Activation Procedures: Hyperventilation: Performed : Photic Stimulation: Performed : Photic stimulation was performed at frequencies between 1 and 21 Hz demonstrating symmetric bioccipital driving responses over a range of frequencies. EEG Description: During the waking state, the predominant posterior resting frequency was a rhythmic, symmetric, bioccipital 7-8 Hz, 20-40 microvolt rhythm.The remaining background activities consisted of diffuse, polymorphic, low to moderate amplitude, mixed frequency alpha and beta range patterns. Drowsiness and sleep were not demonstrated. Severe myogenic and movement artifact were  present throughout the recording and precluded a more thorough interpretation of the data. Impression: Abnormal EEG due to:   1) Theta range background slowing Clinical Correlation: This EEG is suggestive of a mild encephalopathy, but is nonspecific as to etiology. The absence of epileptiform abnormalities does not preclude a clinical diagnosis of seizures. Severe artifact precluded a more thorough interpretation of the data. If clinical concern for seizure persists, suggest repetition of the study when the patient is in a more relaxed state. Petra Kuba) Loistine Chance II MD- American Board of Psychiatry and Neurology (ABPN); American Board of Clinical Neurophysiology  (ABCN) [facility] For Inpatient follow-up with [facility] physician please call [facility] [phone]. This is not an outpatient service. Post hospital discharge, please contact hospital directly.     PERTINENT LAB RESULTS: CBC: Recent Labs    06/27/22 0513 06/28/22 0611  WBC 7.2 5.8  HGB 11.8* 12.6  HCT 34.0* 36.6  PLT 186 133*   CMET CMP     Component Value Date/Time   NA 139 06/29/2022 0448   NA 144 10/03/2020 1603   K 3.6 06/29/2022 0448   CL 105 06/29/2022 0448   CO2 22 06/29/2022 0448   GLUCOSE 88 06/29/2022 0448   BUN <5 (L) 06/29/2022 0448   BUN 9 10/03/2020 1603   CREATININE 0.69 06/29/2022 0448   CREATININE 0.76 05/04/2020 0941   CALCIUM 9.2 06/29/2022 0448   PROT 6.6 06/28/2022 0611   PROT 7.1 10/03/2020 1603   ALBUMIN 4.0 06/28/2022 0611   ALBUMIN 4.6 10/03/2020 1603   AST 51 (H) 06/28/2022 0611   ALT 26 06/28/2022 0611   ALKPHOS 72 06/28/2022 0611   BILITOT 1.2 06/28/2022 0611   BILITOT <0.2 10/03/2020 1603   GFRNONAA >60 06/29/2022 0448   GFRNONAA 111 05/04/2020 0941   GFRAA 128 05/04/2020 0941    GFR Estimated Creatinine Clearance: 100.6 mL/min (by C-G formula based on SCr of 0.69 mg/dL). No results for input(s): "LIPASE", "AMYLASE" in the last 72 hours. No results for input(s): "CKTOTAL",  "CKMB", "CKMBINDEX", "TROPONINI" in the last 72 hours. Invalid input(s): "POCBNP" No results for input(s): "DDIMER" in the last 72 hours. No results for input(s): "HGBA1C" in the last 72 hours. No results for input(s): "CHOL", "HDL", "LDLCALC", "TRIG", "CHOLHDL", "LDLDIRECT" in the last 72 hours. Recent Labs    06/29/22 0448  TSH 5.813*   No results for input(s): "VITAMINB12", "FOLATE", "FERRITIN", "TIBC", "IRON", "RETICCTPCT" in the last 72 hours. Coags: No results for input(s): "INR" in the last 72  hours.  Invalid input(s): "PT" Microbiology: No results found for this or any previous visit (from the past 240 hour(s)).  FURTHER DISCHARGE INSTRUCTIONS:  Get Medicines reviewed and adjusted: Please take all your medications with you for your next visit with your Primary MD  Laboratory/radiological data: Please request your Primary MD to go over all hospital tests and procedure/radiological results at the follow up, please ask your Primary MD to get all Hospital records sent to his/her office.  In some cases, they will be blood work, cultures and biopsy results pending at the time of your discharge. Please request that your primary care M.D. goes through all the records of your hospital data and follows up on these results.  Also Note the following: If you experience worsening of your admission symptoms, develop shortness of breath, life threatening emergency, suicidal or homicidal thoughts you must seek medical attention immediately by calling 911 or calling your MD immediately  if symptoms less severe.  You must read complete instructions/literature along with all the possible adverse reactions/side effects for all the Medicines you take and that have been prescribed to you. Take any new Medicines after you have completely understood and accpet all the possible adverse reactions/side effects.   Do not drive when taking Pain medications or sleeping medications (Benzodaizepines)  Do  not take more than prescribed Pain, Sleep and Anxiety Medications. It is not advisable to combine anxiety,sleep and pain medications without talking with your primary care practitioner  Special Instructions: If you have smoked or chewed Tobacco  in the last 2 yrs please stop smoking, stop any regular Alcohol  and or any Recreational drug use.  Wear Seat belts while driving.  Please note: You were cared for by a hospitalist during your hospital stay. Once you are discharged, your primary care physician will handle any further medical issues. Please note that NO REFILLS for any discharge medications will be authorized once you are discharged, as it is imperative that you return to your primary care physician (or establish a relationship with a primary care physician if you do not have one) for your post hospital discharge needs so that they can reassess your need for medications and monitor your lab values.  Total Time spent coordinating discharge including counseling, education and face to face time equals greater than 30 minutes.  SignedJeoffrey Massed 06/29/2022 7:58 AM

## 2022-07-02 ENCOUNTER — Telehealth: Payer: Self-pay

## 2022-07-02 NOTE — Telephone Encounter (Signed)
Transition Care Management Unsuccessful Follow-up Telephone Call  Date of discharge and from where: TCM DC [facility] 06-29-22 Dx: acute metabolic encephalopathy   Attempts:  1st Attempt  Reason for unsuccessful TCM follow-up call:  Left voice message   Woodfin Ganja LPN [facility] Nurse Health Advisor Direct Dial [phone]

## 2022-07-04 ENCOUNTER — Encounter: Payer: Self-pay | Admitting: Internal Medicine

## 2022-07-04 ENCOUNTER — Ambulatory Visit: Payer: 59 | Admitting: Internal Medicine

## 2022-07-04 VITALS — BP 128/93 | HR 118 | Temp 98.3°F | Resp 12 | Ht 66.0 in | Wt 181.4 lb

## 2022-07-04 DIAGNOSIS — R112 Nausea with vomiting, unspecified: Secondary | ICD-10-CM | POA: Diagnosis not present

## 2022-07-04 DIAGNOSIS — E876 Hypokalemia: Secondary | ICD-10-CM | POA: Diagnosis not present

## 2022-07-04 DIAGNOSIS — F418 Other specified anxiety disorders: Secondary | ICD-10-CM

## 2022-07-04 DIAGNOSIS — R197 Diarrhea, unspecified: Secondary | ICD-10-CM

## 2022-07-04 DIAGNOSIS — Z8659 Personal history of other mental and behavioral disorders: Secondary | ICD-10-CM

## 2022-07-04 DIAGNOSIS — R87610 Atypical squamous cells of undetermined significance on cytologic smear of cervix (ASC-US): Secondary | ICD-10-CM | POA: Diagnosis not present

## 2022-07-04 DIAGNOSIS — R252 Cramp and spasm: Secondary | ICD-10-CM

## 2022-07-04 HISTORY — DX: Personal history of other mental and behavioral disorders: Z86.59

## 2022-07-04 MED ORDER — METHOCARBAMOL 500 MG PO TABS: 500.0000 mg | ORAL_TABLET | Freq: Four times a day (QID) | ORAL | 3 refills | Status: DC

## 2022-07-04 NOTE — Telephone Encounter (Signed)
Patient is being seen for an OV today.

## 2022-07-04 NOTE — Progress Notes (Signed)
[name]: [phone]   Routine Medical Office Visit  Patient:  Natasha Pope [place]      Age: 26 y.o.       Sex:  female  Date:   07/05/2022  PCP:    Loralee Pacas, [place] Provider: Loralee Pacas, MD  Assessment/Plan:    [name] was seen today for hospitalization follow-up.  History of alcohol withdrawal delirium Overview: We discussed her recent relapse and then withdrawal seizures and she felt 71% certain that the return to alcohol use  was due to manic depression as the main trigger so I sent fyi to [name] her psychiatrist to consider medication changes based on this, but don't intend to adjust regimen for that. She also reports ongoing auditory/visual hallucinations and has history of delusion disorder and these psychotic symptoms seem to occur even without association to substance use.  I used the relapse prevention model to reassure her that I continue to support her, I want her to try to avoid triggers and do lots of 12 step meetings and don't isolate this week - she reports 2 weeks sober again now and I congratulated her.    Nausea vomiting and diarrhea Overview: Just for past week or two.  Assessment & Plan: She assured me she has Zofran and she will use that.  I am not sure if this is related to the alcohol relapse and withdrawal or something else. Will do a close follow up next week.   Hypokalemia Overview: Lab Results  Component Value Date   NA 139 06/29/2022   K 3.6 06/29/2022   CO2 22 06/29/2022   GLUCOSE 88 06/29/2022   BUN <5 (L) 06/29/2022   CREATININE 0.69 06/29/2022   CALCIUM 9.2 06/29/2022   EGFR 102 10/03/2020   GFRNONAA >60 06/29/2022       ASCUS of cervix with negative high risk HPV Overview: Negative hpv. Per guidelines, routine screening. Repeat pap in 3 years.   Orders: -     Ambulatory referral to Gynecology  Spasm -     Methocarbamol; Take 1 tablet (500 mg total) by mouth 4  (four) times daily.  Dispense: 120 tablet; Refill: 3  Depression with anxiety -     Ambulatory referral to Psychology     Return in about 1 week (around 07/11/2022).     Subjective:   Natasha Pope is a 26 y.o. female with the following chart data reviewed: Past Medical History:  Diagnosis Date   Anxiety    Asthma    Depression    Memory loss 04/03/2022   A/w alcohol use in past. Suspect due to vitamin deficiency Recommended she take daily mvi and continue to abstain from EtOH   Nicotine dependence due to vaping tobacco product 01/31/2018   Has been smoking since she was 26 years old.    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   Technique we just overweight by BMI but she puts a lot of the weight on her abdomen so I am documenting this as obesity is at least truncal obesity therefore I am going to try to send in for [facility] and see if insurance will cover as not only would help weight loss but actually will reduce her desire to drink   PTSD (post-traumatic stress disorder) 03/19/2022   Pyelonephritis    Seizure-like activity ([place])    Seizures ([place])    Sepsis ([place])  Snoring 04/17/2022   Outpatient Medications Prior to Visit  Medication Sig   acamprosate (CAMPRAL) 333 MG tablet Take 2 tablets (666 mg total) by mouth 3 (three) times daily with meals.   AUVELITY 45-105 MG TBCR TAKE ONE (1) TABLET BY MOUTH TWICE A DAY   busPIRone (BUSPAR) 15 MG tablet Take 1 tablet (15 mg total) by mouth 3 (three) times daily.   DULoxetine (CYMBALTA) 30 MG capsule TAKE ONE CAPSULE BY MOUTH TWICE DAILY   gabapentin (NEURONTIN) 300 MG capsule Take 1 capsule (300 mg total) by mouth 3 (three) times daily.   Multiple Vitamin (MULTIVITAMIN WITH MINERALS) TABS tablet Take 1 tablet by mouth daily.   naltrexone (DEPADE) 50 MG tablet Take 1 tablet (50 mg total) by mouth daily.   norethindrone-ethinyl estradiol-FE (HAILEY FE 1/20) 1-20 MG-MCG tablet Take 1  tablet by mouth daily.   propranolol (INDERAL) 20 MG tablet TAKE ONE TABLET BY MOUTH THREE TIMES DAILY   QUEtiapine (SEROQUEL) 50 MG tablet TAKE ONE TABLET BY MOUTH DAILY AT BEDTIME   thiamine (VITAMIN B-1) 100 MG tablet Take 1 tablet (100 mg total) by mouth daily.   traZODone (DESYREL) 100 MG tablet TAKE ONE TABLET BY MOUTH DAILY AT BEDTIME   [DISCONTINUED] methocarbamol (ROBAXIN) 500 MG tablet Take 1 tablet (500 mg total) by mouth 4 (four) times daily.   No facility-administered medications prior to visit.     She presented today reporting reason for visit as: Chief Complaint  Patient presents with   Hospitalization Follow-up    For seizure on 12/5, due to a relapse. Was vomiting last night often and didn't get much sleep.     Problem-focused charting was used to record today's medical interview as problems and problem overview medical record updates as follows: Problem  History of Alcohol Withdrawal Delirium   We discussed her recent relapse and then withdrawal seizures and she felt 64% certain that the return to alcohol use  was due to manic depression as the main trigger so I sent fyi to [name] her psychiatrist to consider medication changes based on this, but don't intend to adjust regimen for that. She also reports ongoing auditory/visual hallucinations and has history of delusion disorder and these psychotic symptoms seem to occur even without association to substance use.  I used the relapse prevention model to reassure her that I continue to support her, I want her to try to avoid triggers and do lots of 12 step meetings and don't isolate this week - she reports 2 weeks sober again now and I congratulated her.    Nausea Vomiting and Diarrhea   Just for past week or two.             Objective:  Physical Exam  BP (!) 128/93 (BP Location: Right Arm, Patient Position: Sitting)   Pulse (!) 118   Temp 98.3 F (36.8 C) (Temporal)   Resp 12   Ht _0  (1.676 m)   Wt  181 lb 6.4 oz (82.3 kg)   SpO2 99%   BMI 29.28 kg/m  Vital signs reviewed.  Nursing notes reviewed. General Appearance/Constitutional:  very sweet and earnest lady who is clearly trying her best to stay in recovery. Overweight female in no acute distress Musculoskeletal: All extremities are intact.  Neurological:  Awake, alert,  No obvious focal neurological deficits or cognitive impairments Psychiatric:  Appropriate mood, pleasant demeanor Problem-specific findings:  no change in normal cognition, but has always struggled to remember clearly and articulate her  behaviors.     Results Reviewed:  No results found for any visits on 07/04/22.   Recent Results (from the past 2160 hour(s))  POCT urine pregnancy     Status: Normal   Collection Time: 06/01/22 12:31 PM  Result Value Ref Range   Preg Test, Ur Negative Negative  POC COVID-19     Status: Normal   Collection Time: 06/01/22 12:31 PM  Result Value Ref Range   SARS Coronavirus 2 Ag Negative Negative  POCT Influenza A/B     Status: Normal   Collection Time: 06/01/22 12:31 PM  Result Value Ref Range   Influenza A, POC Negative Negative   Influenza B, POC Negative Negative  CBC     Status: Abnormal   Collection Time: 06/26/22  6:52 PM  Result Value Ref Range   WBC 7.0 4.0 - 10.5 K/uL   RBC 3.53 (L) 3.87 - 5.11 MIL/uL   Hemoglobin 12.8 12.0 - 15.0 g/dL   HCT 36.6 36.0 - 46.0 %   MCV 103.7 (H) 80.0 - 100.0 fL   MCH 36.3 (H) 26.0 - 34.0 pg   MCHC 35.0 30.0 - 36.0 g/dL   RDW 17.6 (H) 11.5 - 15.5 %   Platelets 203 150 - 400 K/uL   nRBC 0.6 (H) 0.0 - 0.2 %    Comment: Performed at [facility], [address] [place], [place] [address]  Comprehensive metabolic panel     Status: Abnormal   Collection Time: 06/26/22  6:52 PM  Result Value Ref Range   Sodium 136 135 - 145 mmol/L   Potassium 2.9 (L) 3.5 - 5.1 mmol/L   Chloride 94 (L) 98 - 111 mmol/L   CO2 24 22 - 32 mmol/L   Glucose, Bld 164 (H) 70 - 99 mg/dL     Comment: Glucose reference range applies only to samples taken after fasting for at least 8 hours.   BUN 6 6 - 20 mg/dL   Creatinine, Ser 0.77 0.44 - 1.00 mg/dL   Calcium 9.0 8.9 - 10.3 mg/dL   Total Protein 7.3 6.5 - 8.1 g/dL   Albumin 4.3 3.5 - 5.0 g/dL   AST 65 (H) 15 - 41 U/L   ALT 30 0 - 44 U/L   Alkaline Phosphatase 86 38 - 126 U/L   Total Bilirubin 1.5 (H) 0.3 - 1.2 mg/dL   GFR, Estimated >60 >60 mL/min    Comment: (NOTE) Calculated using the CKD-EPI Creatinine Equation (2021)    Anion gap 18 (H) 5 - 15    Comment: Performed at [facility], [place] [address] [place], [place] [address]  Ethanol     Status: None   Collection Time: 06/26/22  8:47 PM  Result Value Ref Range   Alcohol, Ethyl (B) <10 <10 mg/dL    Comment: (NOTE) Lowest detectable limit for serum alcohol is 10 mg/dL.  For medical purposes only. Performed at [facility], [place] [address], [place], [place] [address]   Lactic acid, plasma     Status: None   Collection Time: 06/26/22  8:47 PM  Result Value Ref Range   Lactic Acid, Venous 1.5 0.5 - 1.9 mmol/L    Comment: Performed at [facility] [address] [place], [place] [address]  HIV Antibody (routine testing w rflx)     Status: None   Collection Time: 06/27/22  2:37 AM  Result Value Ref Range   HIV Screen 4th Generation wRfx Non Reactive  Non Reactive    Comment: Performed at [facility], [place] [address], [place], [place] [address]  Magnesium     Status: Abnormal   Collection Time: 06/27/22  2:37 AM  Result Value Ref Range   Magnesium 1.5 (L) 1.7 - 2.4 mg/dL    Comment: Performed at [facility] [address], [place], [place] [address]  Phosphorus     Status: None   Collection Time: 06/27/22  2:37 AM  Result Value Ref Range   Phosphorus 3.2 2.5 - 4.6 mg/dL    Comment: Performed at [facility] [address], [place], [place] [address]  Comprehensive metabolic panel     Status: Abnormal   Collection Time:  06/27/22  2:37 AM  Result Value Ref Range   Sodium 135 135 - 145 mmol/L   Potassium 3.3 (L) 3.5 - 5.1 mmol/L   Chloride 94 (L) 98 - 111 mmol/L   CO2 26 22 - 32 mmol/L   Glucose, Bld 118 (H) 70 - 99 mg/dL    Comment: Glucose reference range applies only to samples taken after fasting for at least 8 hours.   BUN 6 6 - 20 mg/dL   Creatinine, Ser 0.77 0.44 - 1.00 mg/dL   Calcium 8.7 (L) 8.9 - 10.3 mg/dL   Total Protein 6.7 6.5 - 8.1 g/dL   Albumin 4.2 3.5 - 5.0 g/dL   AST 46 (H) 15 - 41 U/L   ALT 25 0 - 44 U/L   Alkaline Phosphatase 76 38 - 126 U/L   Total Bilirubin 1.3 (H) 0.3 - 1.2 mg/dL   GFR, Estimated >60 >60 mL/min    Comment: (NOTE) Calculated using the CKD-EPI Creatinine Equation (2021)    Anion gap 15 5 - 15    Comment: Performed at [facility], [place] [address] [place], [place] [address]  Urinalysis, Routine w reflex microscopic Urine, Clean Catch     Status: Abnormal   Collection Time: 06/27/22  5:08 AM  Result Value Ref Range   Color, Urine AMBER (A) YELLOW    Comment: BIOCHEMICALS MAY BE AFFECTED BY COLOR   APPearance HAZY (A) CLEAR   Specific Gravity, Urine 1.024 1.005 - 1.030   pH 7.0 5.0 - 8.0   Glucose, UA NEGATIVE NEGATIVE mg/dL   Hgb urine dipstick NEGATIVE NEGATIVE   Bilirubin Urine SMALL (A) NEGATIVE   Ketones, ur 20 (A) NEGATIVE mg/dL   Protein, ur 100 (A) NEGATIVE mg/dL   Nitrite NEGATIVE NEGATIVE   Leukocytes,Ua NEGATIVE NEGATIVE   RBC / HPF 0-5 0 - 5 RBC/hpf   WBC, UA 0-5 0 - 5 WBC/hpf   Bacteria, UA NONE SEEN NONE SEEN   Squamous Epithelial / LPF 6-10 0 - 5   Mucus PRESENT    Hyaline Casts, UA PRESENT     Comment: Performed at [facility], [place] [address] [place], [place] [address]  Pregnancy, urine     Status: None   Collection Time: 06/27/22  5:08 AM  Result Value Ref Range   Preg Test, Ur NEGATIVE NEGATIVE    Comment: Performed at [facility] [address], [place], [place] [address]  Rapid urine drug screen (hospital  performed)     Status: Abnormal   Collection Time: 06/27/22  5:08 AM  Result Value Ref Range   Opiates NONE DETECTED NONE DETECTED   Cocaine NONE DETECTED NONE DETECTED   Benzodiazepines POSITIVE (A) NONE DETECTED   Amphetamines  NONE DETECTED NONE DETECTED   Tetrahydrocannabinol POSITIVE (A) NONE DETECTED   Barbiturates NONE DETECTED NONE DETECTED    Comment: (NOTE) DRUG SCREEN FOR MEDICAL PURPOSES ONLY.  IF CONFIRMATION IS NEEDED FOR ANY PURPOSE, NOTIFY LAB WITHIN 5 DAYS.  LOWEST DETECTABLE LIMITS FOR URINE DRUG SCREEN Drug Class                     Cutoff (ng/mL) Amphetamine and metabolites    1000 Barbiturate and metabolites    200 Benzodiazepine                 200 Opiates and metabolites        300 Cocaine and metabolites        300 THC                            50 Performed at [facility], [place] [address] [place], [place] [address]   CBC     Status: Abnormal   Collection Time: 06/27/22  5:13 AM  Result Value Ref Range   WBC 7.2 4.0 - 10.5 K/uL   RBC 3.27 (L) 3.87 - 5.11 MIL/uL   Hemoglobin 11.8 (L) 12.0 - 15.0 g/dL   HCT 34.0 (L) 36.0 - 46.0 %   MCV 104.0 (H) 80.0 - 100.0 fL   MCH 36.1 (H) 26.0 - 34.0 pg   MCHC 34.7 30.0 - 36.0 g/dL   RDW 17.8 (H) 11.5 - 15.5 %   Platelets 186 150 - 400 K/uL   nRBC 0.8 (H) 0.0 - 0.2 %    Comment: Performed at [facility], [place] [address] [place], [place] [address]  Comprehensive metabolic panel     Status: Abnormal   Collection Time: 06/28/22  6:11 AM  Result Value Ref Range   Sodium 137 135 - 145 mmol/L   Potassium 2.5 (LL) 3.5 - 5.1 mmol/L    Comment: CRITICAL RESULT CALLED TO, READ BACK BY AND VERIFIED WITH S.MCLEAN,RN 0748 06/28/22 CLARK,S   Chloride 99 98 - 111 mmol/L   CO2 26 22 - 32 mmol/L   Glucose, Bld 93 70 - 99 mg/dL    Comment: Glucose reference range applies only to samples taken after fasting for at least 8 hours.   BUN <5 (L) 6 - 20 mg/dL   Creatinine, Ser 0.71 0.44 - 1.00 mg/dL   Calcium 9.2 8.9  - 10.3 mg/dL   Total Protein 6.6 6.5 - 8.1 g/dL   Albumin 4.0 3.5 - 5.0 g/dL   AST 51 (H) 15 - 41 U/L   ALT 26 0 - 44 U/L   Alkaline Phosphatase 72 38 - 126 U/L   Total Bilirubin 1.2 0.3 - 1.2 mg/dL   GFR, Estimated >60 >60 mL/min    Comment: (NOTE) Calculated using the CKD-EPI Creatinine Equation (2021)    Anion gap 12 5 - 15    Comment: Performed at [facility] [address], [place], [place] [address]  CBC     Status: Abnormal   Collection Time: 06/28/22  6:11 AM  Result Value Ref Range   WBC 5.8 4.0 - 10.5 K/uL   RBC 3.47 (L) 3.87 - 5.11 MIL/uL   Hemoglobin 12.6 12.0 - 15.0 g/dL   HCT 36.6 36.0 - 46.0 %   MCV 105.5 (H) 80.0 - 100.0 fL   MCH 36.3 (H) 26.0 - 34.0 pg   MCHC 34.4 30.0 -  36.0 g/dL   RDW 18.1 (H) 11.5 - 15.5 %   Platelets 133 (L) 150 - 400 K/uL    Comment: REPEATED TO VERIFY   nRBC 0.7 (H) 0.0 - 0.2 %    Comment: Performed at [facility], [place] [address], [place], [place] [address]  Magnesium     Status: None   Collection Time: 06/28/22  6:11 AM  Result Value Ref Range   Magnesium 2.0 1.7 - 2.4 mg/dL    Comment: Performed at [facility] [address] [place], [place] [address]  Basic metabolic panel     Status: Abnormal   Collection Time: 06/28/22  3:41 PM  Result Value Ref Range   Sodium 138 135 - 145 mmol/L   Potassium 5.8 (H) 3.5 - 5.1 mmol/L    Comment: HEMOLYSIS AT THIS LEVEL MAY AFFECT RESULT   Chloride 107 98 - 111 mmol/L   CO2 19 (L) 22 - 32 mmol/L   Glucose, Bld 99 70 - 99 mg/dL    Comment: Glucose reference range applies only to samples taken after fasting for at least 8 hours.   BUN <5 (L) 6 - 20 mg/dL   Creatinine, Ser 1.22 (H) 0.44 - 1.00 mg/dL   Calcium 9.5 8.9 - 10.3 mg/dL   GFR, Estimated >60 >60 mL/min    Comment: (NOTE) Calculated using the CKD-EPI Creatinine Equation (2021)    Anion gap 12 5 - 15    Comment: Performed at [facility] [address] [place], [place] [address]  Basic metabolic panel      Status: Abnormal   Collection Time: 06/29/22  4:48 AM  Result Value Ref Range   Sodium 139 135 - 145 mmol/L   Potassium 3.6 3.5 - 5.1 mmol/L   Chloride 105 98 - 111 mmol/L   CO2 22 22 - 32 mmol/L   Glucose, Bld 88 70 - 99 mg/dL    Comment: Glucose reference range applies only to samples taken after fasting for at least 8 hours.   BUN <5 (L) 6 - 20 mg/dL   Creatinine, Ser 0.69 0.44 - 1.00 mg/dL   Calcium 9.2 8.9 - 10.3 mg/dL   GFR, Estimated >60 >60 mL/min    Comment: (NOTE) Calculated using the CKD-EPI Creatinine Equation (2021)    Anion gap 12 5 - 15    Comment: Performed at [facility] [address], [place], [place] [address]  Magnesium     Status: None   Collection Time: 06/29/22  4:48 AM  Result Value Ref Range   Magnesium 1.7 1.7 - 2.4 mg/dL    Comment: Performed at [facility] [address] [place], [place] [address]  TSH     Status: Abnormal   Collection Time: 06/29/22  4:48 AM  Result Value Ref Range   TSH 5.813 (H) 0.350 - 4.500 uIU/mL    Comment: Performed by a 3rd Generation assay with a functional sensitivity of <=0.01 uIU/mL. Performed at [facility], [place] [address] [place], [place] [address]       Loralee Pacas, MD

## 2022-07-05 ENCOUNTER — Telehealth: Payer: Self-pay | Admitting: Internal Medicine

## 2022-07-05 NOTE — Assessment & Plan Note (Addendum)
She assured me she has Zofran and she will use that.  I am not sure if this is related to the alcohol relapse and withdrawal or something else. Will do a close follow up next week.

## 2022-07-05 NOTE — Telephone Encounter (Signed)
Caller is Lawana from [employer] states: -She is now patient's current case worker  - Wanted to make [place] aware of her status - Wanted to confirm our fax number to request needed clinical information  I was able to confirm fax number.

## 2022-07-09 ENCOUNTER — Other Ambulatory Visit: Payer: Self-pay | Admitting: Behavioral Health

## 2022-07-09 DIAGNOSIS — F411 Generalized anxiety disorder: Secondary | ICD-10-CM

## 2022-07-09 DIAGNOSIS — F330 Major depressive disorder, recurrent, mild: Secondary | ICD-10-CM

## 2022-07-10 ENCOUNTER — Telehealth: Payer: Self-pay | Admitting: Internal Medicine

## 2022-07-10 NOTE — Telephone Encounter (Signed)
Spoke with patient, she stated that she didn't call in today. She said that maybe it was her grandmother that called in (not sure).

## 2022-07-10 NOTE — Telephone Encounter (Signed)
Please schedule appt

## 2022-07-10 NOTE — Telephone Encounter (Signed)
Pt is scheduled to see dr. Jon Billings for follow up tomorrow 12/20 at 4 p.m.

## 2022-07-10 NOTE — Telephone Encounter (Signed)
Pt is wanting to speak to provider and did not disclose the issues. Please call pt back at your convenience.

## 2022-07-10 NOTE — Telephone Encounter (Addendum)
Pt is scheduled to see dr. Morrison for follow up tomorrow 12/20 at 4 p.m.  

## 2022-07-10 NOTE — Telephone Encounter (Signed)
Pt seen in office for this concerns on 07/04/22

## 2022-07-11 ENCOUNTER — Ambulatory Visit: Payer: 59 | Admitting: Internal Medicine

## 2022-07-11 ENCOUNTER — Encounter: Payer: Self-pay | Admitting: Internal Medicine

## 2022-07-11 VITALS — BP 121/86 | HR 81 | Temp 96.5°F | Resp 12 | Ht 66.0 in | Wt 187.4 lb

## 2022-07-11 DIAGNOSIS — R413 Other amnesia: Secondary | ICD-10-CM | POA: Diagnosis not present

## 2022-07-11 DIAGNOSIS — F1021 Alcohol dependence, in remission: Secondary | ICD-10-CM

## 2022-07-11 DIAGNOSIS — F99 Mental disorder, not otherwise specified: Secondary | ICD-10-CM

## 2022-07-11 DIAGNOSIS — F5105 Insomnia due to other mental disorder: Secondary | ICD-10-CM | POA: Diagnosis not present

## 2022-07-11 MED ORDER — TRAZODONE HCL 100 MG PO TABS: 100.0000 mg | ORAL_TABLET | Freq: Every day | ORAL | 1 refills | Status: DC

## 2022-07-11 NOTE — Assessment & Plan Note (Addendum)
I reviewed her home medications that she brought all the bottles for today they included:  trazodone 100 mg daily which is currently empty she request a refill which I agreed to do for her today as she has not been sleeping well  methocarbamol 500 mg by mouth 4 times  has aefills either and she likes to use it for muscle spasms I have also agreed to refill it if she would like.    auvelity 45/105 which has dextromethorphan and bupropion and it she request that I allow her to stay on it as she really likes it even though she does have history of alcohol withdrawal seizures- I advised she will need to stop it if she ever returns to alcohol use.  She agreed that she is willing to talk with me next time she is having a relapse before she discontinues because she will need to stop this medicine when she is tapering off alcohol  buspirone 15 mg  3 times daily.  continue acamprosate 333 mg 2 tablets by mouth 3 times daily has plenty of refills.   quetiapine from [facility] at 50 mg nightly which I think is very important and I encouraged her to consistently take.   Abilify 10 mg daily which she says sometimes she forgets to take some of the meds this is also from [facility] it is a 30 tablet prescription with a refill that was filled on September 21 for she has been missing doses of this a lot she only has taken about 15 tablets since September 21 which would be 90 days ago.  I pointed out that her difficulty adhering to this may have contributed to her manic symptoms that preceded recent nearly deadly relapse and advised she try to take more consistently (see AVS) and shared this finding with [name] Propranolol 20 mg 3 times daily from [facility] which she finds helpful.   Gabapentin 100 mg 3 times daily for alcohol cravings but I will still like that it is on therefore seizure prevention    I printed the following AVS guidance and counseled her today at length.   Please remember to take the Abilify  and the Seroquel on a consistent basis as I think this is the most important thing in your recovery.  May be it second most important because I also think to 12 steps in the sponsorship and the social support is super critical.  The remaining  meds which are many I think are less important but I do encourage you to take.  And for example I think the naltrexone that you have been are not taking could be helpful in unpredictable ways if you took it regularly but again I think is secondary to the Abilify and Seroquel.  If you ever return to drinking please let me know before you cold [place] again and I want to help you get into [facility]-another option is she is going to the emergency room while drunk do not drive and they can assist you with getting placed back in inpatient rehab but I think because of how complex her medications are and you are drinking history of seizures with withdrawal that anytime that you have return to use you should have an inpatient detox withdrawal monitored medically.  [employer] National Helpline Confidential free help, from public health agencies, to find substance use treatment and information. Learn more [phone]  We made an inquiry to see if [facility] can help with outpatient support. If  they can't help, try [facility] at [phone] Remember, stay on the medication and the tight meeting schedule over the holidays and best wishes.

## 2022-07-11 NOTE — Telephone Encounter (Signed)
I thought Arlys John said he may be referring her out of [facility].  So do we need call her for an appt?

## 2022-07-11 NOTE — Patient Instructions (Addendum)
Please remember to take the Abilify and the Seroquel on a consistent basis as I think this is the most important thing in your recovery.  May be it second most important because I also think to 12 steps in the sponsorship and the social support is super critical.  The remaining  meds which are many I think are less important but I do encourage you to take.  And for example I think the naltrexone that you have been are not taking could be helpful in unpredictable ways if you took it regularly but again I think is secondary to the Abilify and Seroquel.  If you ever return to drinking please let me know before you cold [place] again and I want to help you get into [facility]-another option is she is going to the emergency room while drunk do not drive and they can assist you with getting placed back in inpatient rehab but I think because of how complex her medications are and you are drinking history of seizures with withdrawal that anytime that you have return to use you should have an inpatient detox withdrawal monitored medically.  [employer] National Helpline Confidential free help, from public health agencies, to find substance use treatment and information. Learn more [phone]  We made an inquiry to see if [facility] can help with outpatient support. If they can't help, try [facility] at [phone] Remember, stay on the medication and the tight meeting schedule over the holidays and best wishes.

## 2022-07-11 NOTE — Telephone Encounter (Signed)
Natasha Pope is she even taking this?

## 2022-07-11 NOTE — Progress Notes (Signed)
[facility]: [phone]   Routine Medical Office Visit  Patient:  Natasha Pope [place]      Age: 26 y.o.       Sex:  female  Date:   07/11/2022  PCP:    [name], MD   Today's Healthcare Provider: [name], MD  Assessment/Plan:   Natasha Pope was seen today for one week follow-up.  Short-term memory loss  Alcohol use disorder, severe, in early remission, dependence (HCC) Overview: Longstanding severe making good progress in recovery as of September and October 2023 then major setback with binge->withdrawal seizure 06/26/22 H/o multiple seizures in 2020 [facility] H/o failing gabapentin and naltrexone - taking gabapentin but not naltrexone  Good support from grandmother, bf, and 12-step sponsor and ladies in early bird   Assessment & Plan: I reviewed her home medications that she brought all the bottles for today they included:  trazodone 100 mg daily which is currently empty she request a refill which I agreed to do for her today as she has not been sleeping well  methocarbamol 500 mg by mouth 4 times  has aefills either and she likes to use it for muscle spasms I have also agreed to refill it if she would like.    auvelity 45/105 which has dextromethorphan and bupropion and it she request that I allow her to stay on it as she really likes it even though she does have history of alcohol withdrawal seizures- I advised she will need to stop it if she ever returns to alcohol use.  She agreed that she is willing to talk with me next time she is having a relapse before she discontinues because she will need to stop this medicine when she is tapering off alcohol  buspirone 15 mg  3 times daily.  continue acamprosate 333 mg 2 tablets by mouth 3 times daily has plenty of refills.   quetiapine from [facility] at 50 mg nightly which I think is very important and I encouraged her to consistently take.   Abilify 10 mg  daily which she says sometimes she forgets to take some of the meds this is also from [facility] it is a 30 tablet prescription with a refill that was filled on September 21 for she has been missing doses of this a lot she only has taken about 15 tablets since September 21 which would be 90 days ago.  I pointed out that her difficulty adhering to this may have contributed to her manic symptoms that preceded recent nearly deadly relapse and advised she try to take more consistently (see AVS) and shared this finding with Natasha Pope Propranolol 20 mg 3 times daily from [facility] which she finds helpful.   Gabapentin 100 mg 3 times daily for alcohol cravings but I will still like that it is on therefore seizure prevention    I printed the following AVS guidance and counseled her today at length.   Please remember to take the Abilify and the Seroquel on a consistent basis as I think this is the most important thing in your recovery.  May be it second most important because I also think to 12 steps in the sponsorship and the social support is super critical.  The remaining  meds which are many I think are less important but I do encourage you to take.  And for example I think the naltrexone that you have been are not taking  could be helpful in unpredictable ways if you took it regularly but again I think is secondary to the Abilify and Seroquel.  If you ever return to drinking please let me know before you cold [place] again and I want to help [facility] get [facility]-another option is she is going to the emergency room while drunk do not drive and they can assist you with getting placed back in inpatient rehab but I think because of how complex her medications are and you are drinking history of seizures with withdrawal that anytime that you have return to use you should have an inpatient detox withdrawal monitored medically.  [employer] National Helpline Confidential free help, from public health agencies,  to find substance use treatment and information. Learn more [phone]  We made an inquiry to see if [facility] can help with outpatient support. If they can't help, try [facility] at [phone] Remember, stay on the medication and the tight meeting schedule over the holidays and best wishes.   Insomnia due to other mental disorder -     traZODone HCl; Take 1 tablet (100 mg total) by mouth at bedtime.  Dispense: 90 tablet; Refill: 1    Follow up next week if possible.   Physician Time-Spent:  50 minutes of total time was spent on the date of this encounter performing the following actions: chart review prior to seeing the patient, obtaining history, performing a medically necessary exam, extensive counseling and documenting on an aggressive outpatient treatment plan, communicating with her psychiatrist, placing orders, and documenting in our EHR.   The extended time spent was medically necessary because the upcoming holidays are a very dangerous time for severe relapse for persons suffering with substance use disorders.     Subjective:   Natasha Pope is a 26 y.o. female with the following chart data reviewed: Past Medical History:  Diagnosis Date   Anxiety    Asthma    Depression    Memory loss 04/03/2022   A/w alcohol use in past. Suspect due to vitamin deficiency Recommended she take daily mvi and continue to abstain from EtOH   Nicotine dependence due to vaping tobacco product 01/31/2018   Has been smoking since she was 26 years old.    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   Technique we just overweight by BMI but she puts a lot of the weight on her abdomen so I am documenting this as obesity is at least truncal obesity therefore I am going to try to send in for [facility] and see if insurance will cover as not only would help weight loss but actually will reduce her desire to drink   PTSD  (post-traumatic stress disorder) 03/19/2022   Pyelonephritis    Seizure-like activity (HCC)    Seizures (HCC)    Sepsis (HCC)    Snoring 04/17/2022   Outpatient Medications Prior to Visit  Medication Sig   acamprosate (CAMPRAL) 333 MG tablet Take 2 tablets (666 mg total) by mouth 3 (three) times daily with meals.   AUVELITY 45-105 MG TBCR TAKE ONE (1) TABLET BY MOUTH TWICE A DAY   busPIRone (BUSPAR) 15 MG tablet Take 1 tablet (15 mg total) by mouth 3 (three) times daily.   DULoxetine (CYMBALTA) 30 MG capsule TAKE ONE CAPSULE BY MOUTH TWICE DAILY   gabapentin (NEURONTIN) 300 MG capsule Take 1 capsule (300 mg total) by mouth 3 (three) times daily.   methocarbamol (  ROBAXIN) 500 MG tablet Take 1 tablet (500 mg total) by mouth 4 (four) times daily.   Multiple Vitamin (MULTIVITAMIN WITH MINERALS) TABS tablet Take 1 tablet by mouth daily.   naltrexone (DEPADE) 50 MG tablet Take 1 tablet (50 mg total) by mouth daily.   norethindrone-ethinyl estradiol-FE (HAILEY FE 1/20) 1-20 MG-MCG tablet Take 1 tablet by mouth daily.   propranolol (INDERAL) 20 MG tablet TAKE ONE TABLET BY MOUTH THREE TIMES DAILY   QUEtiapine (SEROQUEL) 50 MG tablet TAKE ONE TABLET BY MOUTH DAILY AT BEDTIME   thiamine (VITAMIN B-1) 100 MG tablet Take 1 tablet (100 mg total) by mouth daily.   [DISCONTINUED] traZODone (DESYREL) 100 MG tablet TAKE ONE TABLET BY MOUTH DAILY AT BEDTIME   No facility-administered medications prior to visit.     She presented today reporting reason for visit as: Chief Complaint  Patient presents with   One week follow-up     Problem-focused charting was used to record today's medical interview as problems and problem overview medical record updates as follows:  Problem  Alcohol Use Disorder, Severe, in Early Remission, Dependence (Hcc)   Longstanding severe making good progress in recovery as of September and October 2023 then major setback with binge->withdrawal seizure 06/26/22 H/o multiple  seizures in 2020 [facility] H/o failing gabapentin and naltrexone - taking gabapentin but not naltrexone  Good support from grandmother, bf, and 12-step sponsor and ladies in early bird            Objective:  Physical Exam  BP 121/86 (BP Location: Left Arm, Patient Position: Sitting)   Pulse 81   Temp (!) 96.5 F (35.8 C) (Temporal)   Resp 12   Ht 5\' 6"  (1.676 m)   Wt 187 lb 6.4 oz (85 kg)   SpO2 98%   BMI 30.25 kg/m  Vital signs reviewed.  Nursing notes reviewed. General Appearance/Constitutional:  Obese female in no acute distress Musculoskeletal: All extremities are intact.  Neurological:  Awake, alert,  No obvious focal neurological deficits or cognitive impairments Psychiatric:  Appropriate mood, pleasant demeanor Problem-specific findings:  mildly forgetful, unchanged from prior.      Results Reviewed:  No results found for any visits on 07/11/22.   Recent Results (from the past 2160 hour(s))  POCT urine pregnancy     Status: Normal   Collection Time: 06/01/22 12:31 PM  Result Value Ref Range   Preg Test, Ur Negative Negative  POC COVID-19     Status: Normal   Collection Time: 06/01/22 12:31 PM  Result Value Ref Range   SARS Coronavirus 2 Ag Negative Negative  POCT Influenza A/B     Status: Normal   Collection Time: 06/01/22 12:31 PM  Result Value Ref Range   Influenza A, POC Negative Negative   Influenza B, POC Negative Negative  CBC     Status: Abnormal   Collection Time: 06/26/22  6:52 PM  Result Value Ref Range   WBC 7.0 4.0 - 10.5 K/uL   RBC 3.53 (L) 3.87 - 5.11 MIL/uL   Hemoglobin 12.8 12.0 - 15.0 g/dL   HCT 14/05/23 17.6 - 16.0 %   MCV 103.7 (H) 80.0 - 100.0 fL   MCH 36.3 (H) 26.0 - 34.0 pg   MCHC 35.0 30.0 - 36.0 g/dL   RDW 73.7 (H) 10.6 - 26.9 %   Platelets 203 150 - 400 K/uL   nRBC 0.6 (H) 0.0 - 0.2 %    Comment: Performed  at [facility], [address], [place], [place] [address]  Comprehensive metabolic  panel     Status: Abnormal   Collection Time: 06/26/22  6:52 PM  Result Value Ref Range   Sodium 136 135 - 145 mmol/L   Potassium 2.9 (L) 3.5 - 5.1 mmol/L   Chloride 94 (L) 98 - 111 mmol/L   CO2 24 22 - 32 mmol/L   Glucose, Bld 164 (H) 70 - 99 mg/dL    Comment: Glucose reference range applies only to samples taken after fasting for at least 8 hours.   BUN 6 6 - 20 mg/dL   Creatinine, Ser 6.96 0.44 - 1.00 mg/dL   Calcium 9.0 8.9 - 29.5 mg/dL   Total Protein 7.3 6.5 - 8.1 g/dL   Albumin 4.3 3.5 - 5.0 g/dL   AST 65 (H) 15 - 41 U/L   ALT 30 0 - 44 U/L   Alkaline Phosphatase 86 38 - 126 U/L   Total Bilirubin 1.5 (H) 0.3 - 1.2 mg/dL   GFR, Estimated >28 >41 mL/min    Comment: (NOTE) Calculated using the CKD-EPI Creatinine Equation (2021)    Anion gap 18 (H) 5 - 15    Comment: Performed at [facility], [address], [place], [place] [address]  Ethanol     Status: None   Collection Time: 06/26/22  8:47 PM  Result Value Ref Range   Alcohol, Ethyl (B) <10 <10 mg/dL    Comment: (NOTE) Lowest detectable limit for serum alcohol is 10 mg/dL.  For medical purposes only. Performed at [facility], [address], [place], [place] [address]   Lactic acid, plasma     Status: None   Collection Time: 06/26/22  8:47 PM  Result Value Ref Range   Lactic Acid, Venous 1.5 0.5 - 1.9 mmol/L    Comment: Performed at [facility], [address], [place], [place] [address]  HIV Antibody (routine testing w rflx)     Status: None   Collection Time: 06/27/22  2:37 AM  Result Value Ref Range   HIV Screen 4th Generation wRfx Non Reactive Non Reactive    Comment: Performed at [facility], [address] [place], [place] [address]  Magnesium     Status: Abnormal   Collection Time: 06/27/22  2:37 AM  Result Value Ref Range   Magnesium 1.5 (L) 1.7 - 2.4 mg/dL    Comment: Performed at [facility], [address] [place], [place] [address]  Phosphorus     Status: None    Collection Time: 06/27/22  2:37 AM  Result Value Ref Range   Phosphorus 3.2 2.5 - 4.6 mg/dL    Comment: Performed at [facility], [address], [place], [place] [address]  Comprehensive metabolic panel     Status: Abnormal   Collection Time: 06/27/22  2:37 AM  Result Value Ref Range   Sodium 135 135 - 145 mmol/L   Potassium 3.3 (L) 3.5 - 5.1 mmol/L   Chloride 94 (L) 98 - 111 mmol/L   CO2 26 22 - 32 mmol/L   Glucose, Bld 118 (H) 70 - 99 mg/dL    Comment: Glucose reference range applies only to samples taken after fasting for at least 8 hours.   BUN 6 6 - 20 mg/dL   Creatinine, Ser 6.43 0.44 - 1.00 mg/dL   Calcium 8.7 (L) 8.9 - 10.3 mg/dL   Total Protein 6.7  6.5 - 8.1 g/dL   Albumin 4.2 3.5 - 5.0 g/dL   AST 46 (H) 15 - 41 U/L   ALT 25 0 - 44 U/L   Alkaline Phosphatase 76 38 - 126 U/L   Total Bilirubin 1.3 (H) 0.3 - 1.2 mg/dL   GFR, Estimated >60 >10>60 mL/min    Comment: (NOTE) Calculated using the CKD-EPI Creatinine Equation (2021)    Anion gap 15 5 - 15    Comment: Performed at [facility], [address] [place], [place] [address]  Urin>16alysis, Routine w reflex microscopic Urine, Clean Catch     Status: Abnormal   Collection Time: 06/27/22  5:08 AM  Result Value Ref Range   Color, Urine AMBER (A) YELLOW    Comment: BIOCHEMICALS MAY BE AFFECTED BY COLOR   APPearance HAZY (A) CLEAR   Specific Gravity, Urine 1.024 1.005 - 1.030   pH 7.0 5.0 - 8.0   Glucose, UA NEGATIVE NEGATIVE mg/dL   Hgb urine dipstick NEGATIVE NEGATIVE   Bilirubin Urine SMALL (A) NEGATIVE   Ketones, ur 20 (A) NEGATIVE mg/dL   Protein, ur 409100 (A) NEGATIVE mg/dL   Nitrite NEGATIVE NEGATIVE   Leukocytes,Ua NEGATIVE NEGATIVE   RBC / HPF 0-5 0 - 5 RBC/hpf   WBC, UA 0-5 0 - 5 WBC/hpf   Bacteria, UA NONE SEEN NONE SEEN   Squamous Epithelial / LPF 6-10 0 - 5   Mucus PRESENT    Hyaline Casts, UA PRESENT     Comment: Performed at [facility], [address] [place], [place] [address]   Pregnancy, urine     Status: None   Collection Time: 06/27/22  5:08 AM  Result Value Ref Range   Preg Test, Ur NEGATIVE NEGATIVE    Comment: Performed at [facility], [address] [place], [place] [address]  Rapid urine drug screen (hospital performed)     Status: Abnormal   Collection Time: 06/27/22  5:08 AM  Result Value Ref Range   Opiates NONE DETECTED NONE DETECTED   Cocaine NONE DETECTED NONE DETECTED   Benzodiazepines POSITIVE (A) NONE DETECTED   Amphetamines NONE DETECTED NONE DETECTED   Tetrahydrocannabinol POSITIVE (A) NONE DETECTED   Barbiturates NONE DETECTED NONE DETECTED    Comment: (NOTE) DRUG SCREEN FOR MEDICAL PURPOSES ONLY.  IF CONFIRMATION IS NEEDED FOR ANY PURPOSE, NOTIFY LAB WITHIN 5 DAYS.  LOWEST DETECTABLE LIMITS FOR URINE DRUG SCREEN Drug Class                     Cutoff (ng/mL) Amphetamine and metabolites    1000 Barbiturate and metabolites    200 Benzodiazepine                 200 Opiates and metabolites        300 Cocaine and metabolites        300 THC                            50 Performed at [facility], [address] [place], [place] [address]   CBC     Status: Abnormal   Collection Time: 06/27/22  5:13 AM  Result Value Ref Range   WBC 7.2 4.0 - 10.5 K/uL   RBC 3.27 (L) 3.87 - 5.11 MIL/uL   Hemoglobin 11.8 (L) 12.0 - 15.0 g/dL   HCT 08.634.0 (L) 57.836.0 - 46.946.0 %   MCV  104.0 (H) 80.0 - 100.0 fL   MCH 36.1 (H) 26.0 - 34.0 pg   MCHC 34.7 30.0 - 36.0 g/dL   RDW 62.1 (H) 30.8 - 65.7 %   Platelets 186 150 - 400 K/uL   nRBC 0.8 (H) 0.0 - 0.2 %    Comment: Performed at [facility], [address], [place], [place] [address]  Comprehensive metabolic panel     Status: Abnormal   Collection Time: 06/28/22  6:11 AM  Result Value Ref Range   Sodium 137 135 - 145 mmol/L   Potassium 2.5 (LL) 3.5 - 5.1 mmol/L    Comment: CRITICAL RESULT CALLED TO, READ BACK BY AND VERIFIED WITH S.MCLEAN,RN 0748 06/28/22 CLARK,S   Chloride 99 98 -  111 mmol/L   CO2 26 22 - 32 mmol/L   Glucose, Bld 93 70 - 99 mg/dL    Comment: Glucose reference range applies only to samples taken after fasting for at least 8 hours.   BUN <5 (L) 6 - 20 mg/dL   Creatinine, Ser 2.95 0.44 - 1.00 mg/dL   Calcium 9.2 8.9 - 28.4 mg/dL   Total Protein 6.6 6.5 - 8.1 g/dL   Albumin 4.0 3.5 - 5.0 g/dL   AST 51 (H) 15 - 41 U/L   ALT 26 0 - 44 U/L   Alkaline Phosphatase 72 38 - 126 U/L   Total Bilirubin 1.2 0.3 - 1.2 mg/dL   GFR, Estimated >13 >24 mL/min    Comment: (NOTE) Calculated using the CKD-EPI Creatinine Equation (2021)    Anion gap 12 5 - 15    Comment: Performed at [facility], [address], [place], [place] [address]  CBC     Status: Abnormal   Collection Time: 06/28/22  6:11 AM  Result Value Ref Range   WBC 5.8 4.0 - 10.5 K/uL   RBC 3.47 (L) 3.87 - 5.11 MIL/uL   Hemoglobin 12.6 12.0 - 15.0 g/dL   HCT 72.5 36.6 - 44.0 %   MCV 105.5 (H) 80.0 - 100.0 fL   MCH 36.3 (H) 26.0 - 34.0 pg   MCHC 34.4 30.0 - 36.0 g/dL   RDW 34.7 (H) 42.5 - 95.6 %   Platelets 133 (L) 150 - 400 K/uL    Comment: REPEATED TO VERIFY   nRBC 0.7 (H) 0.0 - 0.2 %    Comment: Performed at [facility], [address], [place], [place] [address]  Magnesium     Status: None   Collection Time: 06/28/22  6:11 AM  Result Value Ref Range   Magnesium 2.0 1.7 - 2.4 mg/dL    Comment: Performed at [facility], [address] [place], [place] [address]  Basic metabolic panel     Status: Abnormal   Collection Time: 06/28/22  3:41 PM  Result Value Ref Range   Sodium 138 135 - 145 mmol/L   Potassium 5.8 (H) 3.5 - 5.1 mmol/L    Comment: HEMOLYSIS AT THIS LEVEL MAY AFFECT RESULT   Chloride 107 98 - 111 mmol/L   CO2 19 (L) 22 - 32 mmol/L   Glucose, Bld 99 70 - 99 mg/dL    Comment: Glucose reference range applies only to samples taken after fasting for at least 8 hours.   BUN <5 (L) 6 - 20 mg/dL   Creatinine, Ser 5.18 (H) 0.44 - 1.00 mg/dL   Calcium 9.5 8.9 -  84.1 mg/dL   GFR, Estimated >66 >06 mL/min  Comment: (NOTE) Calculated using the CKD-EPI Creatinine Equation (2021)    Anion gap 12 5 - 15    Comment: Performed at [facility], [address], [place], [place] [address]  Basic metabolic panel     Status: Abnormal   Collection Time: 06/29/22  4:48 AM  Result Value Ref Range   Sodium 139 135 - 145 mmol/L   Potassium 3.6 3.5 - 5.1 mmol/L   Chloride 105 98 - 111 mmol/L   CO2 22 22 - 32 mmol/L   Glucose, Bld 88 70 - 99 mg/dL    Comment: Glucose reference range applies only to samples taken after fasting for at least 8 hours.   BUN <5 (L) 6 - 20 mg/dL   Creatinine, Ser 2.75 0.44 - 1.00 mg/dL   Calcium 9.2 8.9 - 17.0 mg/dL   GFR, Estimated >01 >74 mL/min    Comment: (NOTE) Calculated using the CKD-EPI Creatinine Equation (2021)    Anion gap 12 5 - 15    Comment: Performed at [facility], [address] [place], [place] [address]  Magnesium     Status: None   Collection Time: 06/29/22  4:48 AM  Result Value Ref Range   Magnesium 1.7 1.7 - 2.4 mg/dL    Comment: Performed at [facility], [address], [place], [place] [address]  TSH     Status: Abnormal   Collection Time: 06/29/22  4:48 AM  Result Value Ref Range   TSH 5.813 (H) 0.350 - 4.500 uIU/mL    Comment: Performed by a 3rd Generation assay with a functional sensitivity of <=0.01 uIU/mL. Performed at [facility], [address] [place], [place] [address]       [name], MD

## 2022-07-18 ENCOUNTER — Ambulatory Visit: Payer: 59 | Admitting: Internal Medicine

## 2022-07-18 NOTE — Progress Notes (Deleted)
26 y.o. No obstetric history on file. Single African American/ Caucasian female here for ACUTE NEW GYN.    PCP:     No LMP recorded.           Sexually active: {yes no:314532}  The current method of family planning is {contraception:315051}.    Exercising: {yes no:314532}  {types:19826} Smoker:  {YES NO:22349}  Health Maintenance: Pap:  03/03/18 ASCUS: HR HPV Negative History of abnormal Pap:  yes MMG:  n/a Colonoscopy:  n/a BMD:   n/a  Result  n/a TDaP:  02/28/2021 Gardasil:   {YES LO:75643} HIV: Hep C: Screening Labs:  Hb today: ***, Urine today: ***   reports that she quit smoking about 4 years ago. Her smoking use included cigarettes. She smoked an average of .25 packs per day. She has never used smokeless tobacco. She reports current alcohol use. She reports that she does not currently use drugs after having used the following drugs: Marijuana.  Past Medical History:  Diagnosis Date   Anxiety    Asthma    Depression    Memory loss 04/03/2022   A/w alcohol use in past. Suspect due to vitamin deficiency Recommended she take daily mvi and continue to abstain from EtOH   Nicotine dependence due to vaping tobacco product 01/31/2018   Has been smoking since she was 26 years old.    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   Technique we just overweight by BMI but she puts a lot of the weight on her abdomen so I am documenting this as obesity is at least truncal obesity therefore I am going to try to send in for [facility] and see if insurance will cover as not only would help weight loss but actually will reduce her desire to drink   PTSD (post-traumatic stress disorder) 03/19/2022   Pyelonephritis    Seizure-like activity (HCC)    Seizures (HCC)    Sepsis (HCC)    Snoring 04/17/2022    Past Surgical History:  Procedure Laterality Date   dental procedure      Current Outpatient Medications  Medication Sig Dispense Refill   acamprosate  (CAMPRAL) 333 MG tablet Take 2 tablets (666 mg total) by mouth 3 (three) times daily with meals. 90 tablet 5   AUVELITY 45-105 MG TBCR TAKE ONE (1) TABLET BY MOUTH TWICE A DAY 60 tablet 0   busPIRone (BUSPAR) 15 MG tablet Take 1 tablet (15 mg total) by mouth 3 (three) times daily. 90 tablet 3   DULoxetine (CYMBALTA) 30 MG capsule TAKE ONE CAPSULE BY MOUTH TWICE DAILY 60 capsule 0   gabapentin (NEURONTIN) 300 MG capsule Take 1 capsule (300 mg total) by mouth 3 (three) times daily. 90 capsule 3   methocarbamol (ROBAXIN) 500 MG tablet Take 1 tablet (500 mg total) by mouth 4 (four) times daily. 120 tablet 3   Multiple Vitamin (MULTIVITAMIN WITH MINERALS) TABS tablet Take 1 tablet by mouth daily. 30 tablet 0   naltrexone (DEPADE) 50 MG tablet Take 1 tablet (50 mg total) by mouth daily. 90 tablet 3   norethindrone-ethinyl estradiol-FE (HAILEY FE 1/20) 1-20 MG-MCG tablet Take 1 tablet by mouth daily. 84 tablet 4   propranolol (INDERAL) 20 MG tablet TAKE ONE TABLET BY MOUTH THREE TIMES DAILY 90 tablet 0   QUEtiapine (SEROQUEL) 50 MG tablet TAKE ONE TABLET BY MOUTH DAILY AT BEDTIME 30 tablet 0   thiamine (VITAMIN B-1) 100 MG tablet Take 1 tablet (  100 mg total) by mouth daily. 30 tablet 0   traZODone (DESYREL) 100 MG tablet Take 1 tablet (100 mg total) by mouth at bedtime. 90 tablet 1   No current facility-administered medications for this visit.    Family History  Problem Relation Age of Onset   Depression Mother    Miscarriages / [place] Mother    Thyroid disease Mother    Hypertension Father    Alcohol abuse Father    Learning disabilities Sister    Hyperlipidemia Maternal Grandmother    Alcohol abuse Maternal Grandfather    Alcohol abuse Paternal Grandmother    Hypertension Paternal Grandfather     Review of Systems  Exam:   There were no vitals taken for this visit.    General appearance: alert, cooperative and appears stated age Head: normocephalic, without obvious abnormality,  atraumatic Neck: no adenopathy, supple, symmetrical, trachea midline and thyroid normal to inspection and palpation Lungs: clear to auscultation bilaterally Breasts: normal appearance, no masses or tenderness, No nipple retraction or dimpling, No nipple discharge or bleeding, No axillary adenopathy Heart: regular rate and rhythm Abdomen: soft, non-tender; no masses, no organomegaly Extremities: extremities normal, atraumatic, no cyanosis or edema Skin: skin color, texture, turgor normal. No rashes or lesions Lymph nodes: cervical, supraclavicular, and axillary nodes normal. Neurologic: grossly normal  Pelvic: External genitalia:  no lesions              No abnormal inguinal nodes palpated.              Urethra:  normal appearing urethra with no masses, tenderness or lesions              Bartholins and Skenes: normal                 Vagina: normal appearing vagina with normal color and discharge, no lesions              Cervix: no lesions              Pap taken: {yes no:314532} Bimanual Exam:  Uterus:  normal size, contour, position, consistency, mobility, non-tender              Adnexa: no mass, fullness, tenderness              Rectal exam: {yes no:314532}.  Confirms.              Anus:  normal sphincter tone, no lesions  Chaperone was present for exam:  ***  Assessment:   Well woman visit with gynecologic exam.   Plan: Mammogram screening discussed. Self breast awareness reviewed. Pap and HR HPV as above. Guidelines for Calcium, Vitamin D, regular exercise program including cardiovascular and weight bearing exercise.   Follow up annually and prn.   Additional counseling given.  {yes [id number]. _______ minutes face to face time of which over 50% was spent in counseling.    After visit summary provided.

## 2022-07-20 ENCOUNTER — Encounter: Payer: Self-pay | Admitting: Internal Medicine

## 2022-07-20 ENCOUNTER — Ambulatory Visit: Payer: 59 | Admitting: Internal Medicine

## 2022-07-20 VITALS — BP 117/79 | HR 75 | Temp 98.0°F | Ht 66.0 in | Wt 195.6 lb

## 2022-07-20 DIAGNOSIS — F1094 Alcohol use, unspecified with alcohol-induced mood disorder: Secondary | ICD-10-CM | POA: Diagnosis not present

## 2022-07-21 NOTE — Progress Notes (Signed)
[name]: [phone]   Routine Medical Office Visit  Patient:  Natasha Pope [place]      Age: 26 y.o.       Sex:  female  Date:   07/21/2022  PCP:    Loralee Pacas, [place] Provider: Loralee Pacas, [place]  Assessment/Plan:     Raegin was seen today for one week follow-up.  Alcohol use with alcohol-induced mood disorder ([place]) Overview: ? H/o bipolar used to be on lithium but on abilify now and no diagnosis being given for mood disorder but she endorses hallucinations auditory and visual.  These symptoms are controlled by aripiprazole/seroquel from psych.   Its unclear to me if the hallucinations are alcohol withdrawal related.  She denies any other substance use.   Assessment & Plan: She presented with her grandmother today and we had 35-40 minute group discussion about the relapse and hospitalization 3-4 weeks ago.  Grandmother is worried she is enabling her by paying for her home and hair and nails and other things-according to what she was told at [facility]. Grandmother reported that she had not been avoiding alcohol as we thought prior to the alcohol withdrawal seizures - that people at her building saw her stumbling around for a while prior.  Grandmother felt she needed inpatient treatment(s) that outpatient wouldn't work and would enable her.  [name] absolutely refused to consider inpatient.  I went over other options such as day center, [facility], residential. There was continued tension for both [name] and her grandmother due to grandmother feels she isn't doing the right thing supporting her, that it might be enabling her.   Grandmother does endorse she has done well the last few weeks and staying sober, and [name] is going to meetings every day and [facility].  She reports she is taking the medications as prescribed and consistently.   I recommmend family therapy and  investigated options and [facility]- I don't have the training to provide this and our behavioral health doesn't take Nairi's insurance.   Physician Time-Spent:  50 minutes of total time (6:50 AM-6:50 AM) was spent on the date of this encounter performing the following actions: obtaining history, navigating complex family dynamics in a sensitive group discussion to try to help ensure Natasha Pope would continue to receive support from her grandmother, counseling on the treatment plan, and documenting in our EHR.   The extended time spent was medically necessary because without her grandmothers support, [name] would be high risk for homelessness or unhealthy relationship (social determinants of health risks are very high).         Subjective:   Natasha Pope is a 26 y.o. female with the following chart data reviewed: Past Medical History:  Diagnosis Date   Anxiety    Asthma    Depression    Memory loss 04/03/2022   A/w alcohol use in past. Suspect due to vitamin deficiency Recommended she take daily mvi and continue to abstain from EtOH   Nicotine dependence due to vaping tobacco product 01/31/2018   Has been smoking since she was 26 years old.    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   Technique we just overweight by BMI but she puts a lot of the weight on her abdomen so I am documenting this as obesity is at least truncal obesity  therefore I am going to try to send in for [facility] and see if insurance will cover as not only would help weight loss but actually will reduce her desire to drink   PTSD (post-traumatic stress disorder) 03/19/2022   Pyelonephritis    Seizure-like activity (HCC)    Seizures (HCC)    Sepsis (HCC)    Snoring 04/17/2022   Outpatient Medications Prior to Visit  Medication Sig   acamprosate (CAMPRAL) 333 MG tablet Take 2 tablets (666 mg total) by mouth 3 (three) times daily  with meals.   AUVELITY 45-105 MG TBCR TAKE ONE (1) TABLET BY MOUTH TWICE A DAY   busPIRone (BUSPAR) 15 MG tablet Take 1 tablet (15 mg total) by mouth 3 (three) times daily.   DULoxetine (CYMBALTA) 30 MG capsule TAKE ONE CAPSULE BY MOUTH TWICE DAILY   gabapentin (NEURONTIN) 300 MG capsule Take 1 capsule (300 mg total) by mouth 3 (three) times daily.   methocarbamol (ROBAXIN) 500 MG tablet Take 1 tablet (500 mg total) by mouth 4 (four) times daily.   Multiple Vitamin (MULTIVITAMIN WITH MINERALS) TABS tablet Take 1 tablet by mouth daily.   naltrexone (DEPADE) 50 MG tablet Take 1 tablet (50 mg total) by mouth daily.   norethindrone-ethinyl estradiol-FE (HAILEY FE 1/20) 1-20 MG-MCG tablet Take 1 tablet by mouth daily.   propranolol (INDERAL) 20 MG tablet TAKE ONE TABLET BY MOUTH THREE TIMES DAILY   QUEtiapine (SEROQUEL) 50 MG tablet TAKE ONE TABLET BY MOUTH DAILY AT BEDTIME   thiamine (VITAMIN B-1) 100 MG tablet Take 1 tablet (100 mg total) by mouth daily.   traZODone (DESYREL) 100 MG tablet Take 1 tablet (100 mg total) by mouth at bedtime.   No facility-administered medications prior to visit.     She presented today reporting reason for visit as: Chief Complaint  Patient presents with   One week follow-up     Problem-focused charting was used to record today's medical interview as problems and problem overview medical record updates as follows: Problem  Alcohol Use With Alcohol-Induced Mood Disorder (Hcc)   ? H/o bipolar used to be on lithium but on abilify now and no diagnosis being given for mood disorder but she endorses hallucinations auditory and visual.  These symptoms are controlled by aripiprazole/seroquel from psych.   Its unclear to me if the hallucinations are alcohol withdrawal related.  She denies any other substance use.              Objective:  Physical Exam  BP 117/79 (BP Location: Left Arm, Patient Position: Sitting)   Pulse 75   Temp 98 F (36.7 C) (Temporal)    Ht 5\' 6"  (1.676 m)   Wt 195 lb 9.6 oz (88.7 kg)   SpO2 99%   BMI 31.57 kg/m  Wt Readings from Last 10 Encounters:  07/20/22 195 lb 9.6 oz (88.7 kg)  07/11/22 187 lb 6.4 oz (85 kg)  07/04/22 181 lb 6.4 oz (82.3 kg)  06/26/22 150 lb (68 kg)  06/01/22 188 lb 6.4 oz (85.5 kg)  05/18/22 191 lb (86.6 kg)  05/11/22 193 lb 12.8 oz (87.9 kg)  04/25/22 180 lb 3.2 oz (81.7 kg)  04/17/22 182 lb 6.4 oz (82.7 kg)  04/03/22 181 lb 9.6 oz (82.4 kg)   Today's vital signs reviewed.  Nursing notes reviewed. Weight trend reviewed. General Appearance/Constitutional:  Obese female according to BMI standardized data, but waist circumference data should be used instead because BMI-based assessments do not account for lean  muscle mass or the fact that only adipose tissue around the waist is unhealthy.  She is in no acute distress.  Her hair and nails look great. Musculoskeletal: All extremities are intact.  Neurological:  Awake, alert,  No obvious focal neurological deficits or cognitive impairments Psychiatric:  Appropriate mood, pleasant demeanor    Loralee Pacas, MD

## 2022-07-21 NOTE — Assessment & Plan Note (Signed)
She presented with her grandmother today and we had 35-40 minute group discussion about the relapse and hospitalization 3-4 weeks ago.  Grandmother is worried she is enabling her by paying for her home and hair and nails and other things-according to what she was told at [facility]. Grandmother reported that she had not been avoiding alcohol as we thought prior to the alcohol withdrawal seizures - that people at her building saw her stumbling around for a while prior.  Grandmother felt she needed inpatient treatment(s) that outpatient wouldn't work and would enable her.  [name] absolutely refused to consider inpatient.  I went over other options such as day center, [facility], residential. There was continued tension for both [name] and her grandmother due to grandmother feels she isn't doing the right thing supporting her, that it might be enabling her.   Grandmother does endorse she has done well the last few weeks and staying sober, and [name] is going to meetings every day and [facility].  She reports she is taking the medications as prescribed and consistently.   I recommmend family therapy and investigated options and [facility]- I don't have the training to provide this and our behavioral health doesn't take Marielouise's insurance.

## 2022-07-30 ENCOUNTER — Ambulatory Visit: Payer: 59 | Admitting: Obstetrics and Gynecology

## 2022-08-01 ENCOUNTER — Ambulatory Visit: Payer: 59 | Admitting: Internal Medicine

## 2022-08-03 ENCOUNTER — Ambulatory Visit (INDEPENDENT_AMBULATORY_CARE_PROVIDER_SITE_OTHER): Payer: 59 | Admitting: Internal Medicine

## 2022-08-03 ENCOUNTER — Encounter: Payer: Self-pay | Admitting: Internal Medicine

## 2022-08-03 VITALS — BP 135/89 | HR 111 | Temp 97.7°F | Ht 66.0 in | Wt 191.4 lb

## 2022-08-03 DIAGNOSIS — R52 Pain, unspecified: Secondary | ICD-10-CM | POA: Diagnosis not present

## 2022-08-03 DIAGNOSIS — H6993 Unspecified Eustachian tube disorder, bilateral: Secondary | ICD-10-CM

## 2022-08-03 DIAGNOSIS — J029 Acute pharyngitis, unspecified: Secondary | ICD-10-CM

## 2022-08-03 DIAGNOSIS — M545 Low back pain, unspecified: Secondary | ICD-10-CM

## 2022-08-03 DIAGNOSIS — R509 Fever, unspecified: Secondary | ICD-10-CM

## 2022-08-03 DIAGNOSIS — R1084 Generalized abdominal pain: Secondary | ICD-10-CM

## 2022-08-03 DIAGNOSIS — R0989 Other specified symptoms and signs involving the circulatory and respiratory systems: Secondary | ICD-10-CM

## 2022-08-03 DIAGNOSIS — R6889 Other general symptoms and signs: Secondary | ICD-10-CM

## 2022-08-03 DIAGNOSIS — R112 Nausea with vomiting, unspecified: Secondary | ICD-10-CM

## 2022-08-03 DIAGNOSIS — J019 Acute sinusitis, unspecified: Secondary | ICD-10-CM

## 2022-08-03 LAB — CBC
HCT: 39.2 % (ref 35.0–45.0)
Hemoglobin: 13.7 g/dL (ref 11.7–15.5)
MCH: 33.5 pg — ABNORMAL HIGH (ref 27.0–33.0)
MCHC: 34.9 g/dL (ref 32.0–36.0)
MCV: 95.8 fL (ref 80.0–100.0)
MPV: 10.8 fL (ref 7.5–12.5)
Platelets: 285 10*3/uL (ref 140–400)
RBC: 4.09 10*6/uL (ref 3.80–5.10)
RDW: 15.6 % — ABNORMAL HIGH (ref 11.0–15.0)
WBC: 12.4 10*3/uL — ABNORMAL HIGH (ref 3.8–10.8)

## 2022-08-03 LAB — URINALYSIS, ROUTINE W REFLEX MICROSCOPIC
Hgb urine dipstick: NEGATIVE
Ketones, ur: 15 — AB
Leukocytes,Ua: NEGATIVE
Nitrite: NEGATIVE
Specific Gravity, Urine: 1.015 (ref 1.000–1.030)
Total Protein, Urine: 30 — AB
Urine Glucose: NEGATIVE
Urobilinogen, UA: 2 — AB (ref 0.0–1.0)
pH: 8 (ref 5.0–8.0)

## 2022-08-03 LAB — POCT RAPID STREP A (OFFICE): Rapid Strep A Screen: NEGATIVE

## 2022-08-03 LAB — POCT INFLUENZA A/B
Influenza A, POC: NEGATIVE
Influenza B, POC: NEGATIVE

## 2022-08-03 LAB — POC COVID19 BINAXNOW: SARS Coronavirus 2 Ag: NEGATIVE

## 2022-08-03 NOTE — Progress Notes (Signed)
[facility]: [phone]   Routine Medical Office Visit  Patient:  Natasha Pope [place]      Age: 27 y.o.       Sex:  female  Date:   08/04/2022  PCP:    [name], MD   Today's Healthcare Provider: [name], MD   Problem Focused Charting:   Medical Decision Making per Assessment/Plan   [name] was seen today for 2 week follow-up, sore throat, generalized body aches and nasal congestion.  Flu-like symptoms -     POCT Influenza A/B -     Urinalysis, Routine w reflex microscopic -     Lipase -     Comprehensive metabolic panel -     CBC -     Amylase  Body aches -     POC COVID-19 BinaxNow  Runny nose -     POC COVID-19 BinaxNow  Sore throat -     POCT rapid strep A  Acute sinusitis, recurrence not specified, unspecified location  Eustachian tube dysfunction, bilateral  Nausea and vomiting, unspecified vomiting type  Generalized abdominal pain  Acute low back pain, unspecified back pain laterality, unspecified whether sciatica present  Fever, unspecified fever cause   Missed appointment excuse backdated to yesterday    Subjective - Clinical Presentation:   [name] is a 27 y.o. female  Patient Active Problem List   Diagnosis Date Noted   Flu-like symptoms 08/03/2022   Acute sinusitis 08/03/2022   History of alcohol withdrawal delirium 07/04/2022   Nausea vomiting and diarrhea 06/27/2022   Depression with anxiety 06/27/2022   Snoring 04/17/2022   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 04/17/2022   Memory loss 04/03/2022   PTSD (post-traumatic stress disorder) 03/19/2022   History of seizure due to alcohol withdrawal 02/28/2021   Auditory hallucinations 01/24/2021   Visual hallucination 01/24/2021   Chronic abdominal pain 01/24/2021   Depression 10/03/2020   Delusional disorder (HCC)    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/2019   Alcohol use disorder, severe, in early remission, dependence (HCC)  12/27/2018   Recurrent major depressive disorder (HCC) 12/27/2018   Alcohol use with alcohol-induced mood disorder (HCC)    Substance induced mood disorder (HCC) 12/09/2018   Macrocytic anemia 05/19/2018   Thrombocytopenia (HCC) 05/19/2018   ASCUS of cervix with negative high risk HPV 03/07/2018   Nicotine dependence due to vaping tobacco product 01/31/2018   Asthma 01/31/2018   GAD (generalized anxiety disorder) 01/31/2018   Past Medical History:  Diagnosis Date   Anxiety    Asthma    Depression    Memory loss 04/03/2022   A/w alcohol use in past. Suspect due to vitamin deficiency Recommended she take daily mvi and continue to abstain from EtOH   Nicotine dependence due to vaping tobacco product 01/31/2018   Has been smoking since she was 27 years old.    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   Technique we just overweight by BMI but she puts a lot of the weight on her abdomen so I am documenting this as obesity is at least truncal obesity therefore I am going to try to send in for [facility] and see if insurance will cover as not only would help weight loss but actually will reduce her desire to drink   PTSD (post-traumatic stress disorder) 03/19/2022   Pyelonephritis    Seizure-like activity (HCC)    Seizures (HCC)    Sepsis (HCC)  Snoring 04/17/2022    Outpatient Medications Prior to Visit  Medication Sig   acamprosate (CAMPRAL) 333 MG tablet Take 2 tablets (666 mg total) by mouth 3 (three) times daily with meals.   AUVELITY 45-105 MG TBCR TAKE ONE (1) TABLET BY MOUTH TWICE A DAY   busPIRone (BUSPAR) 15 MG tablet Take 1 tablet (15 mg total) by mouth 3 (three) times daily.   DULoxetine (CYMBALTA) 30 MG capsule TAKE ONE CAPSULE BY MOUTH TWICE DAILY   gabapentin (NEURONTIN) 300 MG capsule Take 1 capsule (300 mg total) by mouth 3 (three) times daily.   methocarbamol (ROBAXIN) 500 MG tablet Take 1 tablet (500 mg total) by mouth 4 (four) times  daily.   Multiple Vitamin (MULTIVITAMIN WITH MINERALS) TABS tablet Take 1 tablet by mouth daily.   naltrexone (DEPADE) 50 MG tablet Take 1 tablet (50 mg total) by mouth daily.   norethindrone-ethinyl estradiol-FE (HAILEY FE 1/20) 1-20 MG-MCG tablet Take 1 tablet by mouth daily.   propranolol (INDERAL) 20 MG tablet TAKE ONE TABLET BY MOUTH THREE TIMES DAILY   QUEtiapine (SEROQUEL) 50 MG tablet TAKE ONE TABLET BY MOUTH DAILY AT BEDTIME   thiamine (VITAMIN B-1) 100 MG tablet Take 1 tablet (100 mg total) by mouth daily.   traZODone (DESYREL) 100 MG tablet Take 1 tablet (100 mg total) by mouth at bedtime.   No facility-administered medications prior to visit.    Chief Complaint  Patient presents with   2 week follow-up   Sore Throat    Symptoms for about two days, vomited  last night, symptoms became worse.   Generalized Body Aches    Entire body.   Nasal Congestion    With runny nose.    HPI  About 2 days severe sinus symptoms with ear pressure and aches. No hearing loss. Mild sinus tenderness Has body aches feels like hot and cold but no proven fevers Negative COVID/flu/strep She says no chance is this withdrawal  Taking dayquil Has discontinue all addiction medication just 2 days ago Last alcohol use in December 2022 Has been around a sick person named mark- doesn't know what the illness is. Doing [facility]- and plans family counseling but not yet. Missed appointment due to this illness.         Objective:  Physical Exam  BP 135/89 (BP Location: Right Arm, Patient Position: Sitting)   Pulse (!) 111   Temp 97.7 F (36.5 C) (Temporal)   Ht 5\' 6"  (1.676 m)   Wt 191 lb 6.4 oz (86.8 kg)   SpO2 99%   BMI 30.89 kg/m  Obese  by BMI criteria but truncal adiposity (waist circumference or caliper) should be used instead. Wt Readings from Last 10 Encounters:  08/03/22 191 lb 6.4 oz (86.8 kg)  07/20/22 195 lb 9.6 oz (88.7 kg)  07/11/22 187 lb 6.4 oz (85 kg)   07/04/22 181 lb 6.4 oz (82.3 kg)  06/26/22 150 lb (68 kg)  06/01/22 188 lb 6.4 oz (85.5 kg)  05/18/22 191 lb (86.6 kg)  05/11/22 193 lb 12.8 oz (87.9 kg)  04/25/22 180 lb 3.2 oz (81.7 kg)  04/17/22 182 lb 6.4 oz (82.7 kg)   Vital signs reviewed.  Nursing notes reviewed. Weight trend reviewed. General Appearance:  Well developed, well nourished female in no acute distress.   Normal work of breathing at rest Musculoskeletal: All extremities are intact.  Neurological:  Awake, alert,  No obvious focal neurological deficits or cognitive impairments Psychiatric:  Appropriate mood,  pleasant demeanor Problem-specific findings:  sniffles   Results Reviewed: Results for orders placed or performed in visit on 08/03/22  Urinalysis, Routine w reflex microscopic  Result Value Ref Range   Color, Urine ORANGE (A) Yellow;Lt. Yellow;Straw;Dark Yellow;Amber;Green;Red;Brown   APPearance SL CLOUDY (A) Clear;Turbid;Slightly Cloudy;Cloudy   Specific Gravity, Urine 1.015 1.000 - 1.030   pH 8.0 5.0 - 8.0   Total Protein, Urine 30 (A) Negative   Urine Glucose NEGATIVE Negative   Ketones, ur 15 (A) Negative   Bilirubin Urine SMALL (A) Negative   Hgb urine dipstick NEGATIVE Negative   Urobilinogen, UA 2.0 (A) 0.0 - 1.0   Leukocytes,Ua NEGATIVE Negative   Nitrite NEGATIVE Negative   WBC, UA 0-2/hpf 0-2/hpf   RBC / HPF 0-2/hpf 0-2/hpf   Squamous Epithelial / HPF Many(>10/hpf) (A) Rare(0-4/hpf)   Bacteria, UA Few(10-50/hpf) (A) None   Epithelial Casts, UA Presence of (A) None  Lipase  Result Value Ref Range   Lipase 6 (L) 7 - 60 U/L  Comp Met (CMET)  Result Value Ref Range   Glucose, Bld 114 (H) 65 - 99 mg/dL   BUN 4 (L) 7 - 25 mg/dL   Creat 8.290.74 5.620.50 - 1.300.96 mg/dL   BUN/Creatinine Ratio 5 (L) 6 - 22 (calc)   Sodium 135 135 - 146 mmol/L   Potassium 3.4 (L) 3.5 - 5.3 mmol/L   Chloride 97 (L) 98 - 110 mmol/L   CO2 18 (L) 20 - 32 mmol/L   Calcium 9.1 8.6 - 10.2 mg/dL   Total Protein 7.9 6.1 - 8.1  g/dL   Albumin 4.5 3.6 - 5.1 g/dL   Globulin 3.4 1.9 - 3.7 g/dL (calc)   AG Ratio 1.3 1.0 - 2.5 (calc)   Total Bilirubin 0.7 0.2 - 1.2 mg/dL   Alkaline phosphatase (APISO) 69 31 - 125 U/L   AST 21 10 - 30 U/L   ALT 18 6 - 29 U/L  CBC  Result Value Ref Range   WBC 12.4 (H) 3.8 - 10.8 Thousand/uL   RBC 4.09 3.80 - 5.10 Million/uL   Hemoglobin 13.7 11.7 - 15.5 g/dL   HCT 86.539.2 78.435.0 - 69.645.0 %   MCV 95.8 80.0 - 100.0 fL   MCH 33.5 (H) 27.0 - 33.0 pg   MCHC 34.9 32.0 - 36.0 g/dL   RDW 29.515.6 (H) 28.411.0 - 13.215.0 %   Platelets 285 140 - 400 Thousand/uL   MPV 10.8 7.5 - 12.5 fL  Amylase  Result Value Ref Range   Amylase 66 21 - 101 U/L  POC COVID-19  Result Value Ref Range   SARS Coronavirus 2 Ag Negative Negative  POCT Influenza A/B  Result Value Ref Range   Influenza A, POC Negative Negative   Influenza B, POC Negative Negative  POCT rapid strep A  Result Value Ref Range   Rapid Strep A Screen Negative Negative    Recent Results (from the past 2160 hour(s))  POCT urine pregnancy     Status: Normal   Collection Time: 06/01/22 12:31 PM  Result Value Ref Range   Preg Test, Ur Negative Negative  POC COVID-19     Status: Normal   Collection Time: 06/01/22 12:31 PM  Result Value Ref Range   SARS Coronavirus 2 Ag Negative Negative  POCT Influenza A/B     Status: Normal   Collection Time: 06/01/22 12:31 PM  Result Value Ref Range   Influenza A, POC Negative Negative   Influenza B, POC Negative Negative  CBC  Status: Abnormal   Collection Time: 06/26/22  6:52 PM  Result Value Ref Range   WBC 7.0 4.0 - 10.5 K/uL   RBC 3.53 (L) 3.87 - 5.11 MIL/uL   Hemoglobin 12.8 12.0 - 15.0 g/dL   HCT 40.0 86.7 - 61.9 %   MCV 103.7 (H) 80.0 - 100.0 fL   MCH 36.3 (H) 26.0 - 34.0 pg   MCHC 35.0 30.0 - 36.0 g/dL   RDW 50.9 (H) 32.6 - 71.2 %   Platelets 203 150 - 400 K/uL   nRBC 0.6 (H) 0.0 - 0.2 %    Comment: Performed at [facility], [address] [place], [place] [address]   Comprehensive metabolic panel     Status: Abnormal   Collection Time: 06/26/22  6:52 PM  Result Value Ref Range   Sodium 136 135 - 145 mmol/L   Potassium 2.9 (L) 3.5 - 5.1 mmol/L   Chloride 94 (L) 98 - 111 mmol/L   CO2 24 22 - 32 mmol/L   Glucose, Bld 164 (H) 70 - 99 mg/dL    Comment: Glucose reference range applies only to samples taken after fasting for at least 8 hours.   BUN 6 6 - 20 mg/dL   Creatinine, Ser 9.83 0.44 - 1.00 mg/dL   Calcium 9.0 8.9 - 38.2 mg/dL   Total Protein 7.3 6.5 - 8.1 g/dL   Albumin 4.3 3.5 - 5.0 g/dL   AST 65 (H) 15 - 41 U/L   ALT 30 0 - 44 U/L   Alkaline Phosphatase 86 38 - 126 U/L   Total Bilirubin 1.5 (H) 0.3 - 1.2 mg/dL   GFR, Estimated >50 >53 mL/min    Comment: (NOTE) Calculated using the CKD-EPI Creatinine Equation (2021)    Anion gap 18 (H) 5 - 15    Comment: Performed at [facility], [address] [place], [place] [address]  Ethanol     Status: None   Collection Time: 06/26/22  8:47 PM  Result Value Ref Range   Alcohol, Ethyl (B) <10 <10 mg/dL    Comment: (NOTE) Lowest detectable limit for serum alcohol is 10 mg/dL.  For medical purposes only. Performed at [facility], [address] [place], [place] [address]   Lactic acid, plasma     Status: None   Collection Time: 06/26/22  8:47 PM  Result Value Ref Range   Lactic Acid, Venous 1.5 0.5 - 1.9 mmol/L    Comment: Performed at [facility], [address], [place], [place] [address]  HIV Antibody (routine testing w rflx)     Status: None   Collection Time: 06/27/22  2:37 AM  Result Value Ref Range   HIV Screen 4th Generation wRfx Non Reactive Non Reactive    Comment: Performed at [facility], [address], [place], [place] [address]  Magnesium     Status: Abnormal   Collection Time: 06/27/22  2:37 AM  Result Value Ref Range   Magnesium 1.5 (L) 1.7 - 2.4 mg/dL    Comment: Performed at [facility], [address], [place], [place] [address]   Phosphorus     Status: None   Collection Time: 06/27/22  2:37 AM  Result Value Ref Range   Phosphorus 3.2 2.5 - 4.6 mg/dL    Comment: Performed at [facility], [address] [place], [place] [address]  Comprehensive metabolic panel     Status: Abnormal  Collection Time: 06/27/22  2:37 AM  Result Value Ref Range   Sodium 135 135 - 145 mmol/L   Potassium 3.3 (L) 3.5 - 5.1 mmol/L   Chloride 94 (L) 98 - 111 mmol/L   CO2 26 22 - 32 mmol/L   Glucose, Bld 118 (H) 70 - 99 mg/dL    Comment: Glucose reference range applies only to samples taken after fasting for at least 8 hours.   BUN 6 6 - 20 mg/dL   Creatinine, Ser 0.77 0.44 - 1.00 mg/dL   Calcium 8.7 (L) 8.9 - 10.3 mg/dL   Total Protein 6.7 6.5 - 8.1 g/dL   Albumin 4.2 3.5 - 5.0 g/dL   AST 46 (H) 15 - 41 U/L   ALT 25 0 - 44 U/L   Alkaline Phosphatase 76 38 - 126 U/L   Total Bilirubin 1.3 (H) 0.3 - 1.2 mg/dL   GFR, Estimated >60 >60 mL/min    Comment: (NOTE) Calculated using the CKD-EPI Creatinine Equation (2021)    Anion gap 15 5 - 15    Comment: Performed at [facility], [place] [address] [place], [place] [address]  Urinalysis, Routine w reflex microscopic Urine, Clean Catch     Status: Abnormal   Collection Time: 06/27/22  5:08 AM  Result Value Ref Range   Color, Urine AMBER (A) YELLOW    Comment: BIOCHEMICALS MAY BE AFFECTED BY COLOR   APPearance HAZY (A) CLEAR   Specific Gravity, Urine 1.024 1.005 - 1.030   pH 7.0 5.0 - 8.0   Glucose, UA NEGATIVE NEGATIVE mg/dL   Hgb urine dipstick NEGATIVE NEGATIVE   Bilirubin Urine SMALL (A) NEGATIVE   Ketones, ur 20 (A) NEGATIVE mg/dL   Protein, ur 100 (A) NEGATIVE mg/dL   Nitrite NEGATIVE NEGATIVE   Leukocytes,Ua NEGATIVE NEGATIVE   RBC / HPF 0-5 0 - 5 RBC/hpf   WBC, UA 0-5 0 - 5 WBC/hpf   Bacteria, UA NONE SEEN NONE SEEN   Squamous Epithelial / HPF 6-10 0 - 5   Mucus PRESENT    Hyaline Casts, UA PRESENT     Comment: Performed at [facility], [place]  [address] [place], [place] [address]  Pregnancy, urine     Status: None   Collection Time: 06/27/22  5:08 AM  Result Value Ref Range   Preg Test, Ur NEGATIVE NEGATIVE    Comment: Performed at [place] [address], [place],  [address]  Rapid urine drug screen (hospital performed)     Status: Abnormal   Collection Time: 06/27/22  5:08 AM  Result Value Ref Range   Opiates NONE DETECTED NONE DETECTED   Cocaine NONE DETECTED NONE DETECTED   Benzodiazepines POSITIVE (A) NONE DETECTED   Amphetamines NONE DETECTED NONE DETECTED   Tetrahydrocannabinol POSITIVE (A) NONE DETECTED   Barbiturates NONE DETECTED NONE DETECTED    Comment: (NOTE) DRUG SCREEN FOR MEDICAL PURPOSES ONLY.  IF CONFIRMATION IS NEEDED FOR ANY PURPOSE, NOTIFY LAB WITHIN 5 DAYS.  LOWEST DETECTABLE LIMITS FOR URINE DRUG SCREEN Drug Class                     Cutoff (ng/mL) Amphetamine and metabolites    1000 Barbiturate and metabolites    200 Benzodiazepine                 200 Opiates and metabolites        300 Cocaine and metabolites  300 THC                            50 Performed at [facility], [place] [address], [place], [place] [address]   CBC     Status: Abnormal   Collection Time: 06/27/22  5:13 AM  Result Value Ref Range   WBC 7.2 4.0 - 10.5 K/uL   RBC 3.27 (L) 3.87 - 5.11 MIL/uL   Hemoglobin 11.8 (L) 12.0 - 15.0 g/dL   HCT 34.0 (L) 36.0 - 46.0 %   MCV 104.0 (H) 80.0 - 100.0 fL   MCH 36.1 (H) 26.0 - 34.0 pg   MCHC 34.7 30.0 - 36.0 g/dL   RDW 17.8 (H) 11.5 - 15.5 %   Platelets 186 150 - 400 K/uL   nRBC 0.8 (H) 0.0 - 0.2 %    Comment: Performed at [facility], [place] [address], [place], [place] [address]  Comprehensive metabolic panel     Status: Abnormal   Collection Time: 06/28/22  6:11 AM  Result Value Ref Range   Sodium 137 135 - 145 mmol/L   Potassium 2.5 (LL) 3.5 - 5.1 mmol/L    Comment: CRITICAL RESULT CALLED TO, READ BACK BY AND VERIFIED WITH S.MCLEAN,RN [id number]  06/28/22 CLARK,S   Chloride 99 98 - 111 mmol/L   CO2 26 22 - 32 mmol/L   Glucose, Bld 93 70 - 99 mg/dL    Comment: Glucose reference range applies only to samples taken after fasting for at least 8 hours.   BUN <5 (L) 6 - 20 mg/dL   Creatinine, Ser 0.71 0.44 - 1.00 mg/dL   Calcium 9.2 8.9 - 10.3 mg/dL   Total Protein 6.6 6.5 - 8.1 g/dL   Albumin 4.0 3.5 - 5.0 g/dL   AST 51 (H) 15 - 41 U/L   ALT 26 0 - 44 U/L   Alkaline Phosphatase 72 38 - 126 U/L   Total Bilirubin 1.2 0.3 - 1.2 mg/dL   GFR, Estimated >60 >60 mL/min    Comment: (NOTE) Calculated using the CKD-EPI Creatinine Equation (2021)    Anion gap 12 5 - 15    Comment: Performed at [place] [address], [place], [place] [address]  CBC     Status: Abnormal   Collection Time: 06/28/22  6:11 AM  Result Value Ref Range   WBC 5.8 4.0 - 10.5 K/uL   RBC 3.47 (L) 3.87 - 5.11 MIL/uL   Hemoglobin 12.6 12.0 - 15.0 g/dL   HCT 36.6 36.0 - 46.0 %   MCV 105.5 (H) 80.0 - 100.0 fL   MCH 36.3 (H) 26.0 - 34.0 pg   MCHC 34.4 30.0 - 36.0 g/dL   RDW 18.1 (H) 11.5 - 15.5 %   Platelets 133 (L) 150 - 400 K/uL    Comment: REPEATED TO VERIFY   nRBC 0.7 (H) 0.0 - 0.2 %    Comment: Performed at [facility], [place] [address] [place], [place] [address]  Magnesium     Status: None   Collection Time: 06/28/22  6:11 AM  Result Value Ref Range   Magnesium 2.0 1.7 - 2.4 mg/dL    Comment: Performed at [place] [address] [place] [address], [place], [place] [address]  Basic metabolic panel     Status: Abnormal   Collection Time: 06/28/22  3:41 PM  Result Value Ref Range   Sodium 138 135 -  145 mmol/L   Potassium 5.8 (H) 3.5 - 5.1 mmol/L    Comment: HEMOLYSIS AT THIS LEVEL MAY AFFECT RESULT   Chloride 107 98 - 111 mmol/L   CO2 19 (L) 22 - 32 mmol/L   Glucose, Bld 99 70 - 99 mg/dL    Comment: Glucose reference range applies only to samples taken after fasting for at least 8 hours.   BUN <5 (L) 6 - 20 mg/dL   Creatinine, Ser 5.18 (H)  0.44 - 1.00 mg/dL   Calcium 9.5 8.9 - 84.1 mg/dL   GFR, Estimated >66 >06 mL/min    Comment: (NOTE) Calculated using the CKD-EPI Creatinine Equation (2021)    Anion gap 12 5 - 15    Comment: Performed at [facility], [address] [place], [place] [address]  Basic metabolic panel     Status: Abnormal   Collection Time: 06/29/22  4:48 AM  Result Value Ref Range   Sodium 139 135 - 145 mmol/L   Potassium 3.6 3.5 - 5.1 mmol/L   Chloride 105 98 - 111 mmol/L   CO2 22 22 - 32 mmol/L   Glucose, Bld 88 70 - 99 mg/dL    Comment: Glucose reference range applies only to samples taken after fasting for at least 8 hours.   BUN <5 (L) 6 - 20 mg/dL   Creatinine, Ser 1.09 0.44 - 1.00 mg/dL   Calcium 9.2 8.9 - 32.3 mg/dL   GFR, Estimated >55 >73 mL/min    Comment: (NOTE) Calculated using the CKD-EPI Creatinine Equation (2021)    Anion gap 12 5 - 15    Comment: Performed at [facility], [address], [place], [place] [address]  Magnesium     Status: None   Collection Time: 06/29/22  4:48 AM  Result Value Ref Range   Magnesium 1.7 1.7 - 2.4 mg/dL    Comment: Performed at [facility], [address] [place], [place] [address]  TSH     Status: Abnormal   Collection Time: 06/29/22  4:48 AM  Result Value Ref Range   TSH 5.813 (H) 0.350 - 4.500 uIU/mL    Comment: Performed by a 3rd Generation assay with a functional sensitivity of <=0.01 uIU/mL. Performed at [facility], [address], [place], [place] [address]   POCT rapid strep A     Status: Normal   Collection Time: 08/03/22  2:15 PM  Result Value Ref Range   Rapid Strep A Screen Negative Negative  POCT Influenza A/B     Status: Normal   Collection Time: 08/03/22  2:15 PM  Result Value Ref Range   Influenza A, POC Negative Negative   Influenza B, POC Negative Negative  POC COVID-19     Status: Normal   Collection Time: 08/03/22  2:16 PM  Result Value Ref Range   SARS Coronavirus 2 Ag Negative Negative   Urinalysis, Routine w reflex microscopic     Status: Abnormal   Collection Time: 08/03/22  2:32 PM  Result Value Ref Range   Color, Urine ORANGE (A) Yellow;Lt. Yellow;Straw;Dark Yellow;Amber;Green;Red;Brown   APPearance SL CLOUDY (A) Clear;Turbid;Slightly Cloudy;Cloudy   Specific Gravity, Urine 1.015 1.000 - 1.030   pH 8.0 5.0 - 8.0   Total Protein, Urine 30 (A) Negative   Urine Glucose NEGATIVE Negative   Ketones, ur 15 (A) Negative   Bilirubin Urine SMALL (A) Negative   Hgb urine dipstick NEGATIVE Negative   Urobilinogen, UA 2.0 (A)  0.0 - 1.0   Leukocytes,Ua NEGATIVE Negative   Nitrite NEGATIVE Negative   WBC, UA 0-2/hpf 0-2/hpf   RBC / HPF 0-2/hpf 0-2/hpf   Squamous Epithelial / HPF Many(>10/hpf) (A) Rare(0-4/hpf)   Bacteria, UA Few(10-50/hpf) (A) None   Epithelial Casts, UA Presence of (A) None  Lipase     Status: Abnormal   Collection Time: 08/03/22  2:36 PM  Result Value Ref Range   Lipase 6 (L) 7 - 60 U/L    Comment: Verified by repeat analysis. .   Comp Met (CMET)     Status: Abnormal   Collection Time: 08/03/22  2:36 PM  Result Value Ref Range   Glucose, Bld 114 (H) 65 - 99 mg/dL    Comment: .            Fasting reference interval . For someone without known diabetes, a glucose value between 100 and 125 mg/dL is consistent with prediabetes and should be confirmed with a follow-up test. .    BUN 4 (L) 7 - 25 mg/dL   Creat 7.40 8.14 - 4.81 mg/dL   BUN/Creatinine Ratio 5 (L) 6 - 22 (calc)   Sodium 135 135 - 146 mmol/L   Potassium 3.4 (L) 3.5 - 5.3 mmol/L   Chloride 97 (L) 98 - 110 mmol/L   CO2 18 (L) 20 - 32 mmol/L   Calcium 9.1 8.6 - 10.2 mg/dL   Total Protein 7.9 6.1 - 8.1 g/dL   Albumin 4.5 3.6 - 5.1 g/dL   Globulin 3.4 1.9 - 3.7 g/dL (calc)   AG Ratio 1.3 1.0 - 2.5 (calc)   Total Bilirubin 0.7 0.2 - 1.2 mg/dL   Alkaline phosphatase (APISO) 69 31 - 125 U/L   AST 21 10 - 30 U/L   ALT 18 6 - 29 U/L  CBC     Status: Abnormal   Collection Time: 08/03/22   2:36 PM  Result Value Ref Range   WBC 12.4 (H) 3.8 - 10.8 Thousand/uL   RBC 4.09 3.80 - 5.10 Million/uL   Hemoglobin 13.7 11.7 - 15.5 g/dL   HCT 85.6 31.4 - 97.0 %   MCV 95.8 80.0 - 100.0 fL   MCH 33.5 (H) 27.0 - 33.0 pg   MCHC 34.9 32.0 - 36.0 g/dL   RDW 26.3 (H) 78.5 - 88.5 %   Platelets 285 140 - 400 Thousand/uL   MPV 10.8 7.5 - 12.5 fL  Amylase     Status: None   Collection Time: 08/03/22  2:36 PM  Result Value Ref Range   Amylase 66 21 - 101 U/L          Signed: [name], MD 08/04/2022 1:38 PM

## 2022-08-03 NOTE — Patient Instructions (Addendum)
This shows  how the eustachian canal works to drain the inner ear and how it is connected to the nasopharynx.  Nasal sinus rinses with saline nasal mist sprays can rinse all of the pollen and allergens and other irritants and pathogens out of his sinuses and nasopharynx, reducing the plugging/swelling around the eustachian tube.  The sinus rinse clears away the mucus and allows nasal steroid sprays to help reach the eustachian tube and reduce swellling to open it up.    Recommmend simplysaline   Basic Sinus Care: Mist each nostril nightly with sterile saline nasal mist, then spray each nostril immediately after with fluticasone nasal spray (Flonase) or other steroid or antihistamine nasal pray Next, if symptoms persist, add a daily allergy pill.  Benadryl is the strongest but will make you very drowsy so only take when you can sleep after.  Ear Pressure Relief Maneuvers: Step 1 If the congestion is mild, you can often use simple maneuvers to quickly alter the pressure in your middle ear, such as: Swallowing Yawning Chewing gum Sucking on hard candy Similar methods can be used on children. If traveling with an infant or toddler, try giving them a bottle, pacifier, or something to drink or suck on.  Warm compress: Applying a warm, moist cloth to the back of your ear can help reduce swelling and help drain congested passages. In some cases, these interventions will cause the ears will pop without trying. If they don't, give it 20 minutes and see if swallowing, yawning, chewing gum, or sucking on hard candy helps.  Step 2 If these methods alone don't help, you can try other interventions like:  Decongestants: OTC drugs like Afrin (oxymetazoline) or Sudafed (pseudoephedrine) work by reducing the swelling of blood vessels in the nasal passages and Eustachian tubes.  Never use these medications for more than a few days at a time, especially afrin is dependency-forming  If this isn't working or  you need more than a few days of afrin or sudafed... you should make an appointment.   Step 3 (just for mod severe ear pressure and pain) If these interventions don't help, there are three other advanced strategies you can try called the Valsalva maneuver, the Toynbee maneuver, and the Frenzel maneuver.  Advanced Strategy 1:  The Valsalva maneuver Inhale. Pinch your nose shut with your fingers. Keeping your lips tightly shut, blow out forcefully as if you are blowing up a balloon. To increase the pressure, try bearing down as if having a bowel movement.  Advanced Strategy 2:  The Toynbee maneuver The Toynbee maneuver may also be safer than the Valsalva maneuver if you've had a previous eardrum injury. The Valsalva method exerts much more pressure on the eardrum and can possibly cause a rupture if you blow too forcefully.  Keep your mouth tightly shut. Pinch your nose shut with your fingers. Swallow hard.  Advanced Strategy 3: The Frenzel maneuver Pinch your nose shut with your fingers Close your mouth and place the tip of your tongue behind your upper front teeth. Push the back of your tongue to the roof of your mouth as if making a hard "G" or "K" sound. The back of your tongue will touch the roof. While doing this, close your vocal folds at the back of your throat and lift your larynx (voice box) up to push the air out of your mouth and into your nose.  ------------------------------------------------------------------------ If all this fails despite extensive efforts then you need to go to an ear  nose and throat for surgical correction - but this should not be tried until everything else fails.

## 2022-08-04 LAB — COMPREHENSIVE METABOLIC PANEL
AG Ratio: 1.3 (calc) (ref 1.0–2.5)
ALT: 18 U/L (ref 6–29)
AST: 21 U/L (ref 10–30)
Albumin: 4.5 g/dL (ref 3.6–5.1)
Alkaline phosphatase (APISO): 69 U/L (ref 31–125)
BUN/Creatinine Ratio: 5 (calc) — ABNORMAL LOW (ref 6–22)
BUN: 4 mg/dL — ABNORMAL LOW (ref 7–25)
CO2: 18 mmol/L — ABNORMAL LOW (ref 20–32)
Calcium: 9.1 mg/dL (ref 8.6–10.2)
Chloride: 97 mmol/L — ABNORMAL LOW (ref 98–110)
Creat: 0.74 mg/dL (ref 0.50–0.96)
Globulin: 3.4 g/dL (calc) (ref 1.9–3.7)
Glucose, Bld: 114 mg/dL — ABNORMAL HIGH (ref 65–99)
Potassium: 3.4 mmol/L — ABNORMAL LOW (ref 3.5–5.3)
Sodium: 135 mmol/L (ref 135–146)
Total Bilirubin: 0.7 mg/dL (ref 0.2–1.2)
Total Protein: 7.9 g/dL (ref 6.1–8.1)

## 2022-08-04 LAB — LIPASE: Lipase: 6 U/L — ABNORMAL LOW (ref 7–60)

## 2022-08-04 LAB — AMYLASE: Amylase: 66 U/L (ref 21–101)

## 2022-08-08 ENCOUNTER — Other Ambulatory Visit: Payer: Self-pay | Admitting: Behavioral Health

## 2022-08-08 DIAGNOSIS — F330 Major depressive disorder, recurrent, mild: Secondary | ICD-10-CM

## 2022-08-08 DIAGNOSIS — F411 Generalized anxiety disorder: Secondary | ICD-10-CM

## 2022-08-09 NOTE — Telephone Encounter (Signed)
Please call to schedule appt.

## 2022-08-13 NOTE — Telephone Encounter (Signed)
Apt 1/31

## 2022-08-16 NOTE — Progress Notes (Signed)
27 y.o. No obstetric history on file. Single African American/Caucasian female here for ACUTE NEW GYN//referral for ASCUS Pap, neg HR HPV on 03/03/18.   Due for a well woman exam.   Agrees to STD screening.   Taking OCPs from her PCP.   PCP:    Berniece Pap MD  LMP: 08/22/2022 Period Cycle (Days): 28 Period Duration (Days): 5 Period Pattern: Regular Menstrual Flow: Heavy Menstrual Control: Maxi pad Dysmenorrhea: (!) Moderate Dysmenorrhea Symptoms: Cramping     Sexually active: Yes.   Female partner.  The current method of family planning is OCP (estrogen/progesterone).    Exercising: Yes.    Squats, home exercise. Smoker:  former  Health Maintenance: Pap:  03/03/18 ASCUS: HR HPV neg History of abnormal Pap:  yes MMG:  n/a Colonoscopy:  n/a BMD:   n/a  Result  n/a TDaP:  02/28/21 Gardasil:   no HIV: PCP Hep C: PCP Screening Labs: PCP Hb today: , Urine today:    reports that she has been smoking cigarettes and e-cigarettes. She has been smoking an average of .25 packs per day. She has never used smokeless tobacco. She reports that she does not currently use alcohol. She reports that she does not currently use drugs after having used the following drugs: Marijuana.  Past Medical History:  Diagnosis Date   Anxiety    Asthma    Depression    Memory loss 04/03/2022   A/w alcohol use in past. Suspect due to vitamin deficiency Recommended she take daily mvi and continue to abstain from EtOH   Nicotine dependence due to vaping tobacco product 01/31/2018   Has been smoking since she was 27 years old.    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   Technique we just overweight by BMI but she puts a lot of the weight on her abdomen so I am documenting this as obesity is at least truncal obesity therefore I am going to try to send in for [facility] and see if insurance will cover as not only would help weight loss but actually will reduce her desire to  drink   PTSD (post-traumatic stress disorder) 03/19/2022   Pyelonephritis    Seizure-like activity ([place])    Seizures ([place])    Sepsis ([place])    Snoring 04/17/2022    Past Surgical History:  Procedure Laterality Date   dental procedure      Current Outpatient Medications  Medication Sig Dispense Refill   acamprosate (CAMPRAL) 333 MG tablet Take 2 tablets (666 mg total) by mouth 3 (three) times daily with meals. 90 tablet 5   AUVELITY 45-105 MG TBCR TAKE ONE (1) TABLET BY MOUTH TWICE A DAY 60 tablet 0   busPIRone (BUSPAR) 15 MG tablet Take 1 tablet (15 mg total) by mouth 3 (three) times daily. 90 tablet 3   DULoxetine (CYMBALTA) 30 MG capsule TAKE ONE CAPSULE BY MOUTH TWICE DAILY 60 capsule 0   gabapentin (NEURONTIN) 300 MG capsule Take 1 capsule (300 mg total) by mouth 3 (three) times daily. 90 capsule 3   methocarbamol (ROBAXIN) 500 MG tablet Take 1 tablet (500 mg total) by mouth 4 (four) times daily. 120 tablet 3   Multiple Vitamin (MULTIVITAMIN WITH MINERALS) TABS tablet Take 1 tablet by mouth daily. 30 tablet 0   naltrexone (DEPADE) 50 MG tablet Take 1 tablet (50 mg total) by mouth daily. 90 tablet 3   norethindrone-ethinyl estradiol-FE (HAILEY FE 1/20) 1-20 MG-MCG tablet Take  1 tablet by mouth daily. 84 tablet 4   propranolol (INDERAL) 20 MG tablet TAKE ONE TABLET BY MOUTH THREE TIMES DAILY 90 tablet 0   QUEtiapine (SEROQUEL) 50 MG tablet TAKE ONE TABLET BY MOUTH DAILY AT BEDTIME 30 tablet 0   thiamine (VITAMIN B-1) 100 MG tablet Take 1 tablet (100 mg total) by mouth daily. 30 tablet 0   traZODone (DESYREL) 100 MG tablet Take 1 tablet (100 mg total) by mouth at bedtime. 90 tablet 1   No current facility-administered medications for this visit.    Family History  Problem Relation Age of Onset   Depression Mother    Miscarriages / [place] Mother    Thyroid disease Mother    Hypertension Father    Alcohol abuse Father    Learning disabilities Sister    Hyperlipidemia  Maternal Grandmother    Alcohol abuse Maternal Grandfather    Alcohol abuse Paternal Grandmother    Hypertension Paternal Grandfather     Review of Systems  All other systems reviewed and are negative.   Exam:   BP 104/68 (BP Location: Left Arm, Patient Position: Sitting, Cuff Size: Large)   Ht 5' 5.5" (1.664 m)   Wt 193 lb (87.5 kg)   LMP 08/22/2022 (Approximate)   BMI 31.63 kg/m     General appearance: alert, cooperative and appears stated age Head: normocephalic, without obvious abnormality, atraumatic Neck: no adenopathy, supple, symmetrical, trachea midline and thyroid normal to inspection and palpation Lungs: clear to auscultation bilaterally Breasts: normal appearance, no masses or tenderness, No nipple retraction or dimpling, No nipple discharge or bleeding, No axillary adenopathy Heart: regular rate and rhythm Abdomen: soft, non-tender; no masses, no organomegaly Extremities: extremities normal, atraumatic, no cyanosis or edema Skin: skin color, texture, turgor normal. No rashes or lesions Lymph nodes: cervical, supraclavicular, and axillary nodes normal. Neurologic: grossly normal  Pelvic: External genitalia:  no lesions              No abnormal inguinal nodes palpated.              Urethra:  normal appearing urethra with no masses, tenderness or lesions              Bartholins and Skenes: normal                 Vagina: normal appearing vagina with normal color and discharge, no lesions              Cervix: no lesions              Pap taken: yes Bimanual Exam:  Uterus:  normal size, contour, position, consistency, mobility, non-tender              Adnexa: no mass, fullness, tenderness           Chaperone was present for exam:  Raquel Sarna  Assessment:   Well woman visit with gynecologic exam. Hx ASCUS pap and negative HR HPV.  Cervical cancer screening.  STD screening.   Plan: Mammogram screening discussed. Self breast awareness reviewed. Pap and HR HPV  collected.  Guidelines for Calcium, Vitamin D, regular exercise program including cardiovascular and weight bearing exercise. STD screening.  Abnormal paps and HPV discussed.  Gardasil vaccine discussed.  She will consider.  Follow up annually and prn.   After visit summary provided.

## 2022-08-17 ENCOUNTER — Ambulatory Visit: Payer: 59 | Admitting: Internal Medicine

## 2022-08-17 ENCOUNTER — Encounter: Payer: Self-pay | Admitting: Internal Medicine

## 2022-08-17 VITALS — BP 113/77 | HR 115 | Temp 97.9°F | Ht 66.0 in | Wt 196.2 lb

## 2022-08-17 DIAGNOSIS — F1021 Alcohol dependence, in remission: Secondary | ICD-10-CM | POA: Diagnosis not present

## 2022-08-17 NOTE — Progress Notes (Signed)
[name]: [phone]   Routine Medical Office Visit  Patient:  Natasha Pope      Age: 27 y.o.       Sex:  female  Date:   08/17/2022  PCP:    Loralee Pacas, [place] Provider: Loralee Pacas, [place]   Problem Focused Charting:   Medical Decision Making per Assessment/Plan   [name] was seen today for 2 week follow-up.  Alcohol use disorder, severe, in early remission, dependence ([place]) Overview: Longstanding severe making good progress in recovery as of September and October 2023 then major setback with binge->withdrawal seizure 06/26/22 H/o multiple seizures in 2020 Has done I/p rehab fellowship hall was good - have had challenges getting o/p support follow up but does 12 steps with sponsor H/o failing gabapentin and naltrexone - taking gabapentin but not naltrexone  now Good support from grandmother, bf, and 12-step sponsor and ladies in [place]    Assessment & Plan: Confirmed she still NOT interested in inpatient rehab. Wrote up a custom individualized homework plan for until next visit: This week I just want you to focus on taking all the medications exactly as prescribed and getting a new sponsorship set up probably best achieved by doing at least 4 and preferably more 12-step meetings.  Also I want you to start looking for jobs.  Also I want you to systematically go to bed at the same time every day regardless of whether you have anything to do in the mornings.  Also I encourage you to try getting up first thing in the morning and going for a walk or even jog to try to get yourself set for a good day may be having iced coffee and then start looking for jobs after maybe doing some stretches I think that would be a good way to keep yourself in a good mindset  Spent 15 minutes on counseling, mostly discussing that I think the medications she has are good (she would like to reduce but she is high risk for dangerous relapse  without medication in my view, and she is not interested in inpatient) Want her to get on a regular sleep schedule       Physician Time-Spent:  20 minutes of total time was spent on the date of this encounter performing the following actions: chart review while seeing the patient, obtaining history, performing a medically necessary exam, counseling on the treatment plan, placing orders, and documenting in our EHR.    This extended time spent was medically necessary because it has been a challenge to get behavioral health support that she is comfortable with so I am taking on a lot of the behavioral health counselor role.  She is following with fellowship hall also and planning on getting new sponsor.       Subjective - Clinical Presentation:   Natasha Pope is a 27 y.o. female  Patient Active Problem List   Diagnosis Date Noted   Flu-like symptoms 08/03/2022   Acute sinusitis 08/03/2022   History of alcohol withdrawal delirium 07/04/2022   Nausea vomiting and diarrhea 06/27/2022   Depression with anxiety 06/27/2022   Snoring 04/17/2022   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 04/17/2022   Memory loss 04/03/2022   PTSD (post-traumatic stress disorder) 03/19/2022   History of seizure due to alcohol withdrawal 02/28/2021   Auditory hallucinations 01/24/2021   Visual hallucination 01/24/2021   Chronic abdominal pain 01/24/2021   Depression 10/03/2020   Delusional disorder (  HCC)    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/2019   Alcohol use disorder, severe, in early remission, dependence (HCC) 12/27/2018   Recurrent major depressive disorder (HCC) 12/27/2018   Alcohol use with alcohol-induced mood disorder (HCC)    Substance induced mood disorder (HCC) 12/09/2018   Macrocytic anemia 05/19/2018   Thrombocytopenia (HCC) 05/19/2018   ASCUS of cervix with negative high risk HPV 03/07/2018   Nicotine dependence due to vaping tobacco product 01/31/2018   Asthma 01/31/2018   GAD  (generalized anxiety disorder) 01/31/2018   Past Medical History:  Diagnosis Date   Anxiety    Asthma    Depression    Memory loss 04/03/2022   A/w alcohol use in past. Suspect due to vitamin deficiency Recommended she take daily mvi and continue to abstain from EtOH   Nicotine dependence due to vaping tobacco product 01/31/2018   Has been smoking since she was 27 years old.    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   Technique we just overweight by BMI but she puts a lot of the weight on her abdomen so I am documenting this as obesity is at least truncal obesity therefore I am going to try to send in for [facility] and see if insurance will cover as not only would help weight loss but actually will reduce her desire to drink   PTSD (post-traumatic stress disorder) 03/19/2022   Pyelonephritis    Seizure-like activity (HCC)    Seizures (HCC)    Sepsis (HCC)    Snoring 04/17/2022    Outpatient Medications Prior to Visit  Medication Sig   acamprosate (CAMPRAL) 333 MG tablet Take 2 tablets (666 mg total) by mouth 3 (three) times daily with meals.   AUVELITY 45-105 MG TBCR TAKE ONE (1) TABLET BY MOUTH TWICE A DAY   busPIRone (BUSPAR) 15 MG tablet Take 1 tablet (15 mg total) by mouth 3 (three) times daily.   DULoxetine (CYMBALTA) 30 MG capsule TAKE ONE CAPSULE BY MOUTH TWICE DAILY   gabapentin (NEURONTIN) 300 MG capsule Take 1 capsule (300 mg total) by mouth 3 (three) times daily.   methocarbamol (ROBAXIN) 500 MG tablet Take 1 tablet (500 mg total) by mouth 4 (four) times daily.   Multiple Vitamin (MULTIVITAMIN WITH MINERALS) TABS tablet Take 1 tablet by mouth daily.   naltrexone (DEPADE) 50 MG tablet Take 1 tablet (50 mg total) by mouth daily.   norethindrone-ethinyl estradiol-FE (HAILEY FE 1/20) 1-20 MG-MCG tablet Take 1 tablet by mouth daily.   propranolol (INDERAL) 20 MG tablet TAKE ONE TABLET BY MOUTH THREE TIMES DAILY   QUEtiapine (SEROQUEL) 50 MG  tablet TAKE ONE TABLET BY MOUTH DAILY AT BEDTIME   thiamine (VITAMIN B-1) 100 MG tablet Take 1 tablet (100 mg total) by mouth daily.   traZODone (DESYREL) 100 MG tablet Take 1 tablet (100 mg total) by mouth at bedtime.   No facility-administered medications prior to visit.    Chief Complaint  Patient presents with   2 week follow-up    HPI  This [place] presents reporting that she has been staying sober and in recovery but she is having to change sponsors due to an accessibility of her current sponsor.  She also has instability in her primary relationship at this time.  But her grandmother continues to be highly supportive and her psychiatrist that was not going to see her has called and asked for her to come see and she thinks she is  going to go see him [employer]. He reports good adherence to her medications including Campral mobility buspirone Cymbalta gabapentin methocarbamol naltrexone tablets propranolol Seroquel vitamin B1 and trazodone.        Objective:  Physical Exam  BP 113/77 (BP Location: Left Arm, Patient Position: Sitting)   Pulse (!) 115   Temp 97.9 F (36.6 C) (Temporal)   Ht 5\' 6"  (1.676 m)   Wt 196 lb 3.2 oz (89 kg)   SpO2 98%   BMI 31.67 kg/m  Obese  by BMI criteria but truncal adiposity (waist circumference or caliper) should be used instead. Wt Readings from Last 10 Encounters:  08/17/22 196 lb 3.2 oz (89 kg)  08/03/22 191 lb 6.4 oz (86.8 kg)  07/20/22 195 lb 9.6 oz (88.7 kg)  07/11/22 187 lb 6.4 oz (85 kg)  07/04/22 181 lb 6.4 oz (82.3 kg)  06/26/22 150 lb (68 kg)  06/01/22 188 lb 6.4 oz (85.5 kg)  05/18/22 191 lb (86.6 kg)  05/11/22 193 lb 12.8 oz (87.9 kg)  04/25/22 180 lb 3.2 oz (81.7 kg)   Vital signs reviewed.  Nursing notes reviewed. Weight trend reviewed. General Appearance:  Well developed, well nourished female in no acute distress.   Normal work of breathing at rest Musculoskeletal: All extremities are intact.  Neurological:  Awake,  alert,  No obvious focal neurological deficits or cognitive impairments Psychiatric:  Appropriate mood, pleasant demeanor Problem-specific findings:  none       Results Reviewed: No results found for any visits on 08/17/22.  Recent Results (from the past 2160 hour(s))  POCT urine pregnancy     Status: Normal   Collection Time: 06/01/22 12:31 PM  Result Value Ref Range   Preg Test, Ur Negative Negative  POC COVID-19     Status: Normal   Collection Time: 06/01/22 12:31 PM  Result Value Ref Range   SARS Coronavirus 2 Ag Negative Negative  POCT Influenza A/B     Status: Normal   Collection Time: 06/01/22 12:31 PM  Result Value Ref Range   Influenza A, POC Negative Negative   Influenza B, POC Negative Negative  CBC     Status: Abnormal   Collection Time: 06/26/22  6:52 PM  Result Value Ref Range   WBC 7.0 4.0 - 10.5 K/uL   RBC 3.53 (L) 3.87 - 5.11 MIL/uL   Hemoglobin 12.8 12.0 - 15.0 g/dL   HCT 16.136.6 09.636.0 - 04.546.0 %   MCV 103.7 (H) 80.0 - 100.0 fL   MCH 36.3 (H) 26.0 - 34.0 pg   MCHC 35.0 30.0 - 36.0 g/dL   RDW 40.917.6 (H) 81.111.5 - 91.415.5 %   Platelets 203 150 - 400 K/uL   nRBC 0.6 (H) 0.0 - 0.2 %    Comment: Performed at [facility], [address] [place], [place] [address]  Comprehensive metabolic panel     Status: Abnormal   Collection Time: 06/26/22  6:52 PM  Result Value Ref Range   Sodium 136 135 - 145 mmol/L   Potassium 2.9 (L) 3.5 - 5.1 mmol/L   Chloride 94 (L) 98 - 111 mmol/L   CO2 24 22 - 32 mmol/L   Glucose, Bld 164 (H) 70 - 99 mg/dL    Comment: Glucose reference range applies only to samples taken after fasting for at least 8 hours.   BUN 6 6 - 20 mg/dL   Creatinine, Ser 6.210.77 0.44 - 1.00 mg/dL   Calcium 9.0 8.9 - 10.3  mg/dL   Total Protein 7.3 6.5 - 8.1 g/dL   Albumin 4.3 3.5 - 5.0 g/dL   AST 65 (H) 15 - 41 U/L   ALT 30 0 - 44 U/L   Alkaline Phosphatase 86 38 - 126 U/L   Total Bilirubin 1.5 (H) 0.3 - 1.2 mg/dL   GFR, Estimated >09 >32 mL/min    Comment:  (NOTE) Calculated using the CKD-EPI Creatinine Equation (2021)    Anion gap 18 (H) 5 - 15    Comment: Performed at [facility], [address] [place], [place] [address]  Ethanol     Status: None   Collection Time: 06/26/22  8:47 PM  Result Value Ref Range   Alcohol, Ethyl (B) <10 <10 mg/dL    Comment: (NOTE) Lowest detectable limit for serum alcohol is 10 mg/dL.  For medical purposes only. Performed at [facility], [address], [place], [place] [address]   Lactic acid, plasma     Status: None   Collection Time: 06/26/22  8:47 PM  Result Value Ref Range   Lactic Acid, Venous 1.5 0.5 - 1.9 mmol/L    Comment: Performed at [facility], [address], [place], [place] [address]  HIV Antibody (routine testing w rflx)     Status: None   Collection Time: 06/27/22  2:37 AM  Result Value Ref Range   HIV Screen 4th Generation wRfx Non Reactive Non Reactive    Comment: Performed at [facility], [address], [place], [place] [address]  Magnesium     Status: Abnormal   Collection Time: 06/27/22  2:37 AM  Result Value Ref Range   Magnesium 1.5 (L) 1.7 - 2.4 mg/dL    Comment: Performed at [facility], [address] [place], [place] [address]  Phosphorus     Status: None   Collection Time: 06/27/22  2:37 AM  Result Value Ref Range   Phosphorus 3.2 2.5 - 4.6 mg/dL    Comment: Performed at [facility], [address] [place], [place] [address]  Comprehensive metabolic panel     Status: Abnormal   Collection Time: 06/27/22  2:37 AM  Result Value Ref Range   Sodium 135 135 - 145 mmol/L   Potassium 3.3 (L) 3.5 - 5.1 mmol/L   Chloride 94 (L) 98 - 111 mmol/L   CO2 26 22 - 32 mmol/L   Glucose, Bld 118 (H) 70 - 99 mg/dL    Comment: Glucose reference range applies only to samples taken after fasting for at least 8 hours.   BUN 6 6 - 20 mg/dL   Creatinine, Ser 0.24 0.44 - 1.00 mg/dL   Calcium 8.7 (L) 8.9 - 10.3 mg/dL   Total Protein 6.7 6.5 -  8.1 g/dL   Albumin 4.2 3.5 - 5.0 g/dL   AST 46 (H) 15 - 41 U/L   ALT 25 0 - 44 U/L   Alkaline Phosphatase 76 38 - 126 U/L   Total Bilirubin 1.3 (H) 0.3 - 1.2 mg/dL   GFR, Estimated >09 >73 mL/min    Comment: (NOTE) Calculated using the CKD-EPI Creatinine Equation (2021)    Anion gap 15 5 - 15    Comment: Performed at [facility], [address] [place], [place] [address]  Urinalysis, Routine w reflex microscopic Urine, Clean Catch     Status: Abnormal   Collection Time: 06/27/22  5:08 AM  Result Value Ref Range  Color, Urine AMBER (A) YELLOW    Comment: BIOCHEMICALS MAY BE AFFECTED BY COLOR   APPearance HAZY (A) CLEAR   Specific Gravity, Urine 1.024 1.005 - 1.030   pH 7.0 5.0 - 8.0   Glucose, UA NEGATIVE NEGATIVE mg/dL   Hgb urine dipstick NEGATIVE NEGATIVE   Bilirubin Urine SMALL (A) NEGATIVE   Ketones, ur 20 (A) NEGATIVE mg/dL   Protein, ur 100 (A) NEGATIVE mg/dL   Nitrite NEGATIVE NEGATIVE   Leukocytes,Ua NEGATIVE NEGATIVE   RBC / HPF 0-5 0 - 5 RBC/hpf   WBC, UA 0-5 0 - 5 WBC/hpf   Bacteria, UA NONE SEEN NONE SEEN   Squamous Epithelial / HPF 6-10 0 - 5   Mucus PRESENT    Hyaline Casts, UA PRESENT     Comment: Performed at [facility] [address], [place], [place] [address]  Pregnancy, urine     Status: None   Collection Time: 06/27/22  5:08 AM  Result Value Ref Range   Preg Test, Ur NEGATIVE NEGATIVE    Comment: Performed at [facility] [address] [place], [place] [address]  Rapid urine drug screen (hospital performed)     Status: Abnormal   Collection Time: 06/27/22  5:08 AM  Result Value Ref Range   Opiates NONE DETECTED NONE DETECTED   Cocaine NONE DETECTED NONE DETECTED   Benzodiazepines POSITIVE (A) NONE DETECTED   Amphetamines NONE DETECTED NONE DETECTED   Tetrahydrocannabinol POSITIVE (A) NONE DETECTED   Barbiturates NONE DETECTED NONE DETECTED    Comment: (NOTE) DRUG SCREEN FOR MEDICAL PURPOSES ONLY.  IF CONFIRMATION IS  NEEDED FOR ANY PURPOSE, NOTIFY LAB WITHIN 5 DAYS.  LOWEST DETECTABLE LIMITS FOR URINE DRUG SCREEN Drug Class                     Cutoff (ng/mL) Amphetamine and metabolites    1000 Barbiturate and metabolites    200 Benzodiazepine                 200 Opiates and metabolites        300 Cocaine and metabolites        300 THC                            50 Performed at [facility], [place] [address] [place], [place] [address]   CBC     Status: Abnormal   Collection Time: 06/27/22  5:13 AM  Result Value Ref Range   WBC 7.2 4.0 - 10.5 K/uL   RBC 3.27 (L) 3.87 - 5.11 MIL/uL   Hemoglobin 11.8 (L) 12.0 - 15.0 g/dL   HCT 34.0 (L) 36.0 - 46.0 %   MCV 104.0 (H) 80.0 - 100.0 fL   MCH 36.1 (H) 26.0 - 34.0 pg   MCHC 34.7 30.0 - 36.0 g/dL   RDW 17.8 (H) 11.5 - 15.5 %   Platelets 186 150 - 400 K/uL   nRBC 0.8 (H) 0.0 - 0.2 %    Comment: Performed at [facility], [place] [address] [place], [place] [address]  Comprehensive metabolic panel     Status: Abnormal   Collection Time: 06/28/22  6:11 AM  Result Value Ref Range   Sodium 137 135 - 145 mmol/L   Potassium 2.5 (LL) 3.5 - 5.1 mmol/L    Comment: CRITICAL RESULT CALLED TO, READ BACK BY AND VERIFIED WITH S.MCLEAN,RN 0748 06/28/22  CLARK,S   Chloride 99 98 - 111 mmol/L   CO2 26 22 - 32 mmol/L   Glucose, Bld 93 70 - 99 mg/dL    Comment: Glucose reference range applies only to samples taken after fasting for at least 8 hours.   BUN <5 (L) 6 - 20 mg/dL   Creatinine, Ser 2.950.71 0.44 - 1.00 mg/dL   Calcium 9.2 8.9 - 62.110.3 mg/dL   Total Protein 6.6 6.5 - 8.1 g/dL   Albumin 4.0 3.5 - 5.0 g/dL   AST 51 (H) 15 - 41 U/L   ALT 26 0 - 44 U/L   Alkaline Phosphatase 72 38 - 126 U/L   Total Bilirubin 1.2 0.3 - 1.2 mg/dL   GFR, Estimated >30>60 >86>60 mL/min    Comment: (NOTE) Calculated using the CKD-EPI Creatinine Equation (2021)    Anion gap 12 5 - 15    Comment: Performed at [facility], [address] [place], [place] [address]  CBC      Status: Abnormal   Collection Time: 06/28/22  6:11 AM  Result Value Ref Range   WBC 5.8 4.0 - 10.5 K/uL   RBC 3.47 (L) 3.87 - 5.11 MIL/uL   Hemoglobin 12.6 12.0 - 15.0 g/dL   HCT 96.236.6 95.236.0 - 84.146.0 %   MCV 105.5 (H) 80.0 - 100.0 fL   MCH 36.3 (H) 26.0 - 34.0 pg   MCHC 34.4 30.0 - 36.0 g/dL   RDW 32.418.1 (H) 40.111.5 - 02.715.5 %   Platelets 133 (L) 150 - 400 K/uL    Comment: REPEATED TO VERIFY   nRBC 0.7 (H) 0.0 - 0.2 %    Comment: Performed at [facility], [address] [place], [place] [address]  Magnesium     Status: None   Collection Time: 06/28/22  6:11 AM  Result Value Ref Range   Magnesium 2.0 1.7 - 2.4 mg/dL    Comment: Performed at [facility], [address] [place], [place] [address]  Basic metabolic panel     Status: Abnormal   Collection Time: 06/28/22  3:41 PM  Result Value Ref Range   Sodium 138 135 - 145 mmol/L   Potassium 5.8 (H) 3.5 - 5.1 mmol/L    Comment: HEMOLYSIS AT THIS LEVEL MAY AFFECT RESULT   Chloride 107 98 - 111 mmol/L   CO2 19 (L) 22 - 32 mmol/L   Glucose, Bld 99 70 - 99 mg/dL    Comment: Glucose reference range applies only to samples taken after fasting for at least 8 hours.   BUN <5 (L) 6 - 20 mg/dL   Creatinine, Ser 7.421.22 (H) 0.44 - 1.00 mg/dL   Calcium 9.5 8.9 - 59.510.3 mg/dL   GFR, Estimated >63>60 >87>60 mL/min    Comment: (NOTE) Calculated using the CKD-EPI Creatinine Equation (2021)    Anion gap 12 5 - 15    Comment: Performed at [facility], [address] [place], [place] [address]  Basic metabolic panel     Status: Abnormal   Collection Time: 06/29/22  4:48 AM  Result Value Ref Range   Sodium 139 135 - 145 mmol/L   Potassium 3.6 3.5 - 5.1 mmol/L   Chloride 105 98 - 111 mmol/L   CO2 22 22 - 32 mmol/L   Glucose, Bld 88 70 - 99 mg/dL    Comment: Glucose reference range applies only to samples taken after fasting for at least 8 hours.   BUN <  5 (L) 6 - 20 mg/dL   Creatinine, Ser 7.10 0.44 - 1.00 mg/dL   Calcium 9.2 8.9 - 62.6  mg/dL   GFR, Estimated >94 >85 mL/min    Comment: (NOTE) Calculated using the CKD-EPI Creatinine Equation (2021)    Anion gap 12 5 - 15    Comment: Performed at [facility], [address] [place], [place] [address]  Magnesium     Status: None   Collection Time: 06/29/22  4:48 AM  Result Value Ref Range   Magnesium 1.7 1.7 - 2.4 mg/dL    Comment: Performed at [facility], [address] [place], [place] [address]  TSH     Status: Abnormal   Collection Time: 06/29/22  4:48 AM  Result Value Ref Range   TSH 5.813 (H) 0.350 - 4.500 uIU/mL    Comment: Performed by a 3rd Generation assay with a functional sensitivity of <=0.01 uIU/mL. Performed at [facility], [address], [place], [place] [address]   POCT rapid strep A     Status: Normal   Collection Time: 08/03/22  2:15 PM  Result Value Ref Range   Rapid Strep A Screen Negative Negative  POCT Influenza A/B     Status: Normal   Collection Time: 08/03/22  2:15 PM  Result Value Ref Range   Influenza A, POC Negative Negative   Influenza B, POC Negative Negative  POC COVID-19     Status: Normal   Collection Time: 08/03/22  2:16 PM  Result Value Ref Range   SARS Coronavirus 2 Ag Negative Negative  Urinalysis, Routine w reflex microscopic     Status: Abnormal   Collection Time: 08/03/22  2:32 PM  Result Value Ref Range   Color, Urine ORANGE (A) Yellow;Lt. Yellow;Straw;Dark Yellow;Amber;Green;Red;Brown   APPearance SL CLOUDY (A) Clear;Turbid;Slightly Cloudy;Cloudy   Specific Gravity, Urine 1.015 1.000 - 1.030   pH 8.0 5.0 - 8.0   Total Protein, Urine 30 (A) Negative   Urine Glucose NEGATIVE Negative   Ketones, ur 15 (A) Negative   Bilirubin Urine SMALL (A) Negative   Hgb urine dipstick NEGATIVE Negative   Urobilinogen, UA 2.0 (A) 0.0 - 1.0   Leukocytes,Ua NEGATIVE Negative   Nitrite NEGATIVE Negative   WBC, UA 0-2/hpf 0-2/hpf   RBC / HPF 0-2/hpf 0-2/hpf   Squamous Epithelial / HPF Many(>10/hpf) (A)  Rare(0-4/hpf)   Bacteria, UA Few(10-50/hpf) (A) None   Epithelial Casts, UA Presence of (A) None  Lipase     Status: Abnormal   Collection Time: 08/03/22  2:36 PM  Result Value Ref Range   Lipase 6 (L) 7 - 60 U/L    Comment: Verified by repeat analysis. .   Comp Met (CMET)     Status: Abnormal   Collection Time: 08/03/22  2:36 PM  Result Value Ref Range   Glucose, Bld 114 (H) 65 - 99 mg/dL    Comment: .            Fasting reference interval . For someone without known diabetes, a glucose value between 100 and 125 mg/dL is consistent with prediabetes and should be confirmed with a follow-up test. .    BUN 4 (L) 7 - 25 mg/dL   Creat 9.93 7.16 - 9.67 mg/dL   BUN/Creatinine Ratio 5 (L) 6 - 22 (calc)   Sodium 135 135 - 146 mmol/L   Potassium 3.4 (L) 3.5 - 5.3 mmol/L   Chloride 97 (L) 98 - 110 mmol/L  CO2 18 (L) 20 - 32 mmol/L   Calcium 9.1 8.6 - 10.2 mg/dL   Total Protein 7.9 6.1 - 8.1 g/dL   Albumin 4.5 3.6 - 5.1 g/dL   Globulin 3.4 1.9 - 3.7 g/dL (calc)   AG Ratio 1.3 1.0 - 2.5 (calc)   Total Bilirubin 0.7 0.2 - 1.2 mg/dL   Alkaline phosphatase (APISO) 69 31 - 125 U/L   AST 21 10 - 30 U/L   ALT 18 6 - 29 U/L  CBC     Status: Abnormal   Collection Time: 08/03/22  2:36 PM  Result Value Ref Range   WBC 12.4 (H) 3.8 - 10.8 Thousand/uL   RBC 4.09 3.80 - 5.10 Million/uL   Hemoglobin 13.7 11.7 - 15.5 g/dL   HCT 16.139.2 09.635.0 - 04.545.0 %   MCV 95.8 80.0 - 100.0 fL   MCH 33.5 (H) 27.0 - 33.0 pg   MCHC 34.9 32.0 - 36.0 g/dL   RDW 40.915.6 (H) 81.111.0 - 91.415.0 %   Platelets 285 140 - 400 Thousand/uL   MPV 10.8 7.5 - 12.5 fL  Amylase     Status: None   Collection Time: 08/03/22  2:36 PM  Result Value Ref Range   Amylase 66 21 - 101 U/L          Signed: Lula Olszewskiyan G Ronni Osterberg, MD 08/17/2022 7:41 PM

## 2022-08-17 NOTE — Patient Instructions (Signed)
This week I just want you to focus on taking all the medications exactly as prescribed and getting a new sponsorship set up probably best achieved by doing at least 4 and preferably more 12-step meetings.  Also I want you to start looking for jobs.  Also I want you to systematically go to bed at the same time every day regardless of whether you have anything to do in the mornings.  Also I encourage you to try getting up first thing in the morning and going for a walk or even jog to try to get yourself set for a good day may be having iced coffee and then start looking for jobs after maybe doing some stretches I think that would be a good way to keep yourself in a good mindset

## 2022-08-17 NOTE — Assessment & Plan Note (Signed)
Confirmed she still NOT interested in inpatient rehab. Wrote up a custom individualized homework plan for until next visit: This week I just want you to focus on taking all the medications exactly as prescribed and getting a new sponsorship set up probably best achieved by doing at least 4 and preferably more 12-step meetings.  Also I want you to start looking for jobs.  Also I want you to systematically go to bed at the same time every day regardless of whether you have anything to do in the mornings.  Also I encourage you to try getting up first thing in the morning and going for a walk or even jog to try to get yourself set for a good day may be having iced coffee and then start looking for jobs after maybe doing some stretches I think that would be a good way to keep yourself in a good mindset  Spent 15 minutes on counseling, mostly discussing that I think the medications she has are good (she would like to reduce but she is high risk for dangerous relapse without medication in my view, and she is not interested in inpatient) Want her to get on a regular sleep schedule

## 2022-08-22 ENCOUNTER — Ambulatory Visit: Payer: 59 | Admitting: Behavioral Health

## 2022-08-29 ENCOUNTER — Encounter: Payer: Self-pay | Admitting: Obstetrics and Gynecology

## 2022-08-29 ENCOUNTER — Ambulatory Visit: Payer: 59 | Admitting: Obstetrics and Gynecology

## 2022-08-29 ENCOUNTER — Other Ambulatory Visit (HOSPITAL_COMMUNITY)
Admission: RE | Admit: 2022-08-29 | Discharge: 2022-08-29 | Disposition: A | Discharge status: Home / Self Care | Payer: 59 | Source: Ambulatory Visit | Attending: Obstetrics and Gynecology | Admitting: Obstetrics and Gynecology

## 2022-08-29 VITALS — BP 104/68 | Ht 65.5 in | Wt 193.0 lb

## 2022-08-29 DIAGNOSIS — Z1159 Encounter for screening for other viral diseases: Secondary | ICD-10-CM

## 2022-08-29 DIAGNOSIS — Z124 Encounter for screening for malignant neoplasm of cervix: Secondary | ICD-10-CM | POA: Insufficient documentation

## 2022-08-29 DIAGNOSIS — Z113 Encounter for screening for infections with a predominantly sexual mode of transmission: Secondary | ICD-10-CM | POA: Insufficient documentation

## 2022-08-29 DIAGNOSIS — Z114 Encounter for screening for human immunodeficiency virus [HIV]: Secondary | ICD-10-CM

## 2022-08-29 DIAGNOSIS — Z01419 Encounter for gynecological examination (general) (routine) without abnormal findings: Secondary | ICD-10-CM | POA: Diagnosis not present

## 2022-08-29 NOTE — Patient Instructions (Addendum)
EXERCISE AND DIET:  We recommended that you start or continue a regular exercise program for good health. Regular exercise means any activity that makes your heart beat faster and makes you sweat.  We recommend exercising at least 30 minutes per day at least 3 days a week, preferably 4 or 5.  We also recommend a diet low in fat and sugar.  Inactivity, poor dietary choices and obesity can cause diabetes, heart attack, stroke, and kidney damage, among others.    ALCOHOL AND SMOKING:  Women should limit their alcohol intake to no more than 7 drinks/beers/glasses of wine (combined, not each!) per week. Moderation of alcohol intake to this level decreases your risk of breast cancer and liver damage. And of course, no recreational drugs are part of a healthy lifestyle.  And absolutely no smoking or even second hand smoke. Most people know smoking can cause heart and lung diseases, but did you know it also contributes to weakening of your bones? Aging of your skin?  Yellowing of your teeth and nails?  CALCIUM AND VITAMIN D:  Adequate intake of calcium and Vitamin D are recommended.  The recommendations for exact amounts of these supplements seem to change often, but generally speaking 600 mg of calcium (either carbonate or citrate) and 800 units of Vitamin D per day seems prudent. Certain women may benefit from higher intake of Vitamin D.  If you are among these women, your doctor will have told you during your visit.    PAP SMEARS:  Pap smears, to check for cervical cancer or precancers,  have traditionally been done yearly, although recent scientific advances have shown that most women can have pap smears less often.  However, every woman still should have a physical exam from her gynecologist every year. It will include a breast check, inspection of the vulva and vagina to check for abnormal growths or skin changes, a visual exam of the cervix, and then an exam to evaluate the size and shape of the uterus and  ovaries.  And after 26 years of age, a rectal exam is indicated to check for rectal cancers. We will also provide age appropriate advice regarding health maintenance, like when you should have certain vaccines, screening for sexually transmitted diseases, bone density testing, colonoscopy, mammograms, etc.   MAMMOGRAMS:  All women over 40 years old should have a yearly mammogram. Many facilities now offer a "3D" mammogram, which may cost around $50 extra out of pocket. If possible,  we recommend you accept the option to have the 3D mammogram performed.  It both reduces the number of women who will be called back for extra views which then turn out to be normal, and it is better than the routine mammogram at detecting truly abnormal areas.    COLONOSCOPY:  Colonoscopy to screen for colon cancer is recommended for all women at age 50.  We know, you hate the idea of the prep.  We agree, BUT, having colon cancer and not knowing it is worse!!  Colon cancer so often starts as a polyp that can be seen and removed at colonscopy, which can quite literally save your life!  And if your first colonoscopy is normal and you have no family history of colon cancer, most women don't have to have it again for 10 years.  Once every ten years, you can do something that may end up saving your life, right?  We will be happy to help you get it scheduled when you are ready.    Be sure to check your insurance coverage so you understand how much it will cost.  It may be covered as a preventative service at no cost, but you should check your particular policy.     HPV (Human Papillomavirus) Vaccine: What You Need to Know 1. Why get vaccinated? HPV (human papillomavirus) vaccine can prevent infection with some types of human papillomavirus. HPV infections can cause certain types of cancers, including: cervical, vaginal, and vulvar cancers in women penile cancer in men anal cancers in both men and women cancers of tonsils, base of  tongue, and back of throat (oropharyngeal cancer) in both men and women HPV infections can also cause anogenital warts. HPV vaccine can prevent over 90% of cancers caused by HPV. HPV is spread through intimate skin-to-skin or sexual contact. HPV infections are so common that nearly all people will get at least one type of HPV at some time in their lives. Most HPV infections go away on their own within 2 years. But sometimes HPV infections will last longer and can cause cancers later in life. 2. HPV vaccine HPV vaccine is routinely recommended for adolescents at 24 or 26 years of age to ensure they are protected before they are exposed to the virus. HPV vaccine may be given beginning at age 64 years and vaccination is recommended for everyone through 26 years of age. HPV vaccine may be given to adults 75 through 26 years of age, based on discussions between the patient and health care provider. Most children who get the first dose before 36 years of age need 2 doses of HPV vaccine. People who get the first dose at or after 36 years of age and younger people with certain immunocompromising conditions need 3 doses. Your health care provider can give you more information. HPV vaccine may be given at the same time as other vaccines. 3. Talk with your health care provider Tell your vaccination provider if the person getting the vaccine: Has had an allergic reaction after a previous dose of HPV vaccine, or has any severe, life-threatening allergies Is pregnant--HPV vaccine is not recommended until after pregnancy In some cases, your health care provider may decide to postpone HPV vaccination until a future visit. People with minor illnesses, such as a cold, may be vaccinated. People who are moderately or severely ill should usually wait until they recover before getting HPV vaccine. Your health care provider can give you more information. 4. Risks of a vaccine reaction Soreness, redness, or swelling where  the shot is given can happen after HPV vaccination. Fever or headache can happen after HPV vaccination. People sometimes faint after medical procedures, including vaccination. Tell your provider if you feel dizzy or have vision changes or ringing in the ears. As with any medicine, there is a very remote chance of a vaccine causing a severe allergic reaction, other serious injury, or death. 5. What if there is a serious problem? An allergic reaction could occur after the vaccinated person leaves the clinic. If you see signs of a severe allergic reaction (hives, swelling of the face and throat, difficulty breathing, a fast heartbeat, dizziness, or weakness), call 9-1-1 and get the person to the nearest hospital. For other signs that concern you, call your health care provider. Adverse reactions should be reported to the Vaccine Adverse Event Reporting System (VAERS). Your health care provider will usually file this report, or you can do it yourself. Visit the VAERS website at www.vaers.SamedayNews.es or call 501-803-6935. VAERS is only for reporting  reactions, and VAERS staff members do not give medical advice. 6. The National Vaccine Injury Compensation Program The [employer] Vaccine Injury Compensation Program (VICP) is a federal program that was created to compensate people who may have been injured by certain vaccines. Claims regarding alleged injury or death due to vaccination have a time limit for filing, which may be as short as two years. Visit the VICP website at GoldCloset.com.ee or call (914) 888-3425 to learn about the program and about filing a claim. 7. How can I learn more? Ask your health care provider. Call your local or state health department. Visit the website of the Food and Drug Administration (FDA) for vaccine package inserts and additional information at TraderRating.uy. Contact the Centers for Disease Control and Prevention (CDC): Call  (684)355-5519 (1-800-CDC-INFO) or Visit CDC's website at http://hunter.com/. Source: CDC Vaccine Information Statement HPV Vaccine (02/26/2020) This same material is available at http://www.wolf.info/ for no charge. This information is not intended to replace advice given to you by your health care provider. Make sure you discuss any questions you have with your health care provider. Document Revised: 06/05/2021 Document Reviewed: 03/30/2021 Elsevier Patient Education  [place].

## 2022-08-30 ENCOUNTER — Encounter: Payer: 59 | Admitting: Registered Nurse

## 2022-08-30 LAB — HEPATITIS C ANTIBODY: Hepatitis C Ab: NONREACTIVE

## 2022-08-30 LAB — HIV ANTIBODY (ROUTINE TESTING W REFLEX): HIV 1&2 Ab, 4th Generation: NONREACTIVE

## 2022-08-30 LAB — RPR: RPR Ser Ql: NONREACTIVE

## 2022-09-03 LAB — CYTOLOGY - PAP
Chlamydia: NEGATIVE
Comment: NEGATIVE
Comment: NEGATIVE
Comment: NEGATIVE
Comment: NORMAL
Diagnosis: NEGATIVE
High risk HPV: NEGATIVE
Neisseria Gonorrhea: NEGATIVE
Trichomonas: NEGATIVE

## 2022-09-07 ENCOUNTER — Ambulatory Visit: Payer: 59 | Admitting: Internal Medicine

## 2022-09-10 ENCOUNTER — Other Ambulatory Visit: Payer: Self-pay | Admitting: Behavioral Health

## 2022-09-10 DIAGNOSIS — F330 Major depressive disorder, recurrent, mild: Secondary | ICD-10-CM

## 2022-09-10 DIAGNOSIS — F411 Generalized anxiety disorder: Secondary | ICD-10-CM

## 2022-09-12 ENCOUNTER — Ambulatory Visit: Payer: 59 | Admitting: Internal Medicine

## 2022-09-20 ENCOUNTER — Telehealth: Payer: Self-pay | Admitting: Internal Medicine

## 2022-09-20 ENCOUNTER — Encounter: Payer: Self-pay | Admitting: Internal Medicine

## 2022-09-20 ENCOUNTER — Ambulatory Visit: Payer: 59 | Admitting: Internal Medicine

## 2022-09-20 VITALS — BP 118/86 | HR 86 | Temp 98.1°F | Ht 65.5 in | Wt 192.2 lb

## 2022-09-20 DIAGNOSIS — F411 Generalized anxiety disorder: Secondary | ICD-10-CM

## 2022-09-20 DIAGNOSIS — F1021 Alcohol dependence, in remission: Secondary | ICD-10-CM

## 2022-09-20 DIAGNOSIS — F4001 Agoraphobia with panic disorder: Secondary | ICD-10-CM | POA: Diagnosis not present

## 2022-09-20 DIAGNOSIS — F331 Major depressive disorder, recurrent, moderate: Secondary | ICD-10-CM

## 2022-09-20 DIAGNOSIS — F4310 Post-traumatic stress disorder, unspecified: Secondary | ICD-10-CM | POA: Diagnosis not present

## 2022-09-20 DIAGNOSIS — F5105 Insomnia due to other mental disorder: Secondary | ICD-10-CM

## 2022-09-20 DIAGNOSIS — E669 Obesity, unspecified: Secondary | ICD-10-CM

## 2022-09-20 DIAGNOSIS — F99 Mental disorder, not otherwise specified: Secondary | ICD-10-CM

## 2022-09-20 MED ORDER — BUSPIRONE HCL 15 MG PO TABS: 15.0000 mg | ORAL_TABLET | Freq: Three times a day (TID) | ORAL | 3 refills | Status: DC

## 2022-09-20 MED ORDER — PROPRANOLOL HCL 20 MG PO TABS: 20.0000 mg | ORAL_TABLET | Freq: Three times a day (TID) | ORAL | 3 refills | Status: DC

## 2022-09-20 MED ORDER — TOPIRAMATE 25 MG PO TABS: 25.0000 mg | ORAL_TABLET | Freq: Two times a day (BID) | ORAL | 0 refills | Status: DC

## 2022-09-20 MED ORDER — QUETIAPINE FUMARATE 100 MG PO TABS: 100.0000 mg | ORAL_TABLET | Freq: Every day | ORAL | 0 refills | Status: DC

## 2022-09-20 NOTE — Assessment & Plan Note (Signed)
Encouraged continuing with abstinence- she reports 84 days sober today Patient reports sleepy, depressed, PTSD flaring, trazodone not working- self-discontinued [facility] to avoid public admission of cannabis. Encouraged avoid sedative-hypnotics

## 2022-09-20 NOTE — Telephone Encounter (Signed)
Patient states [facility]'s e-prescribe system is down. Requests all RX's that were sent to [facility] (today 09/20/22) either be hard faxed to [facility] or Pharmacy must be called via telephone.

## 2022-09-20 NOTE — Telephone Encounter (Signed)
Patient states [facility]'s e-prescribe system is down. Requests all RX's that were sent to [facility] (today 09/20/22) either be hard faxed to [facility] or Pharmacy must be called via telephone.

## 2022-09-21 MED ORDER — QUETIAPINE FUMARATE 100 MG PO TABS: 100.0000 mg | ORAL_TABLET | Freq: Every day | ORAL | 0 refills | Status: DC

## 2022-09-21 MED ORDER — PROPRANOLOL HCL 20 MG PO TABS: 20.0000 mg | ORAL_TABLET | Freq: Three times a day (TID) | ORAL | 3 refills | Status: DC

## 2022-09-21 NOTE — Progress Notes (Signed)
Natasha [facility]: [phone]   Routine Medical Office Visit  Patient:  Natasha Pope [place]      Age: 27 y.o.       Sex:  female  Date:   09/21/2022  PCP:    Natasha Pope, [place] Provider: Loralee Pacas, MD   Problem Focused Charting:   Medical Decision Making per Assessment/Plan   Natasha Pope was seen today for follow-up.  PTSD (post-traumatic stress disorder) -     Ambulatory referral to Psychology  Insomnia due to other mental disorder -     QUEtiapine Fumarate; Take 1 tablet (100 mg total) by mouth at bedtime. Replaces 50 mg dosing but please limit to minimum necessary  Dispense: 90 tablet; Refill: 0  Generalized anxiety disorder -     busPIRone HCl; Take 1 tablet (15 mg total) by mouth 3 (three) times daily. Must be taken consistently.  Dispense: 270 tablet; Refill: 3 -     Propranolol HCl; Take 1 tablet (20 mg total) by mouth 3 (three) times daily.  Dispense: 270 tablet; Refill: 3  Panic disorder with agoraphobia -     busPIRone HCl; Take 1 tablet (15 mg total) by mouth 3 (three) times daily. Must be taken consistently.  Dispense: 270 tablet; Refill: 3  Moderate episode of recurrent major depressive disorder (HCC) -     busPIRone HCl; Take 1 tablet (15 mg total) by mouth 3 (three) times daily. Must be taken consistently.  Dispense: 270 tablet; Refill: 3  Obesity (BMI 30-39.9) -     Topiramate; Take 1 tablet (25 mg total) by mouth 2 (two) times daily.  Dispense: 60 tablet; Refill: 0  Alcohol use disorder, severe, in early remission, dependence ([place]) Overview: Longstanding severe making good progress in recovery as of September and October 2023 then major setback with binge->withdrawal seizure 06/26/22 H/o multiple seizures in 2020 Has done [facility]hab [facility] was good - have had challenges getting o/p support follow up but does 12 steps with sponsor H/o failing gabapentin and naltrexone - taking gabapentin but not naltrexone   now Good support from grandmother, bf, and 12-step sponsor and ladies in [place]    Assessment & Plan: Encouraged continuing with abstinence- she reports 64 days sober today Patient reports sleepy, depressed, PTSD flaring, trazodone not working- self-discontinued [facility] to avoid public admission of cannabis. Encouraged avoid sedative-hypnotics    She thinks she is taking all the medications but doesn't have bottles She needs refills so we did Most of visit spent counseling, developing plan to get her continuing with behavioral health support and maintain her family and sponsor support.  Also I reluctantly increase seroquel over sleep issue failing trazodone, and add topiramate to offset the weight loss while minimizing seizure recurrence risk.  Medical Decision Making: [Medical Problems Considered] 2 or more stable chronic illnesses Prescription drug management        Subjective - Clinical Presentation:   Natasha Pope is a 27 y.o. female  Patient Active Problem List   Diagnosis Date Noted   Flu-like symptoms 08/03/2022   Acute sinusitis 08/03/2022   History of alcohol withdrawal delirium 07/04/2022   Nausea vomiting and diarrhea 06/27/2022   Depression with anxiety 06/27/2022   Snoring 04/17/2022   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 04/17/2022   Memory loss 04/03/2022   PTSD (post-traumatic stress disorder) 03/19/2022   History of seizure due to alcohol withdrawal 02/28/2021   Auditory hallucinations 01/24/2021   Visual hallucination  01/24/2021   Chronic abdominal pain 01/24/2021   Depression 10/03/2020   Delusional disorder ([place])    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/2019   Alcohol use disorder, severe, in early remission, dependence ([place]) 12/27/2018   Recurrent major depressive disorder ([place]) 12/27/2018   Alcohol use with alcohol-induced mood disorder ([place])    Substance induced mood disorder ([place]) 12/09/2018   Macrocytic anemia 05/19/2018    Thrombocytopenia ([place]) 05/19/2018   ASCUS of cervix with negative high risk HPV 03/07/2018   Nicotine dependence due to vaping tobacco product 01/31/2018   Asthma 01/31/2018   GAD (generalized anxiety disorder) 01/31/2018   Past Medical History:  Diagnosis Date   Anxiety    Asthma    Depression    Memory loss 04/03/2022   A/w alcohol use in past. Suspect due to vitamin deficiency Recommended she take daily mvi and continue to abstain from EtOH   Nicotine dependence due to vaping tobacco product 01/31/2018   Has been smoking since she was 27 years old.    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   Technique we just overweight by BMI but she puts a lot of the weight on her abdomen so I am documenting this as obesity is at least truncal obesity therefore I am going to try to send in for [facility] and see if insurance will cover as not only would help weight loss but actually will reduce her desire to drink   PTSD (post-traumatic stress disorder) 03/19/2022   Pyelonephritis    Seizure-like activity ([place])    Seizures ([place])    Sepsis ([place])    Snoring 04/17/2022    Outpatient Medications Prior to Visit  Medication Sig   acamprosate (CAMPRAL) 333 MG tablet Take 2 tablets (666 mg total) by mouth 3 (three) times daily with meals.   AUVELITY 45-105 MG TBCR TAKE ONE (1) TABLET BY MOUTH TWICE A DAY   DULoxetine (CYMBALTA) 30 MG capsule TAKE ONE CAPSULE BY MOUTH TWICE DAILY   gabapentin (NEURONTIN) 300 MG capsule Take 1 capsule (300 mg total) by mouth 3 (three) times daily.   methocarbamol (ROBAXIN) 500 MG tablet Take 1 tablet (500 mg total) by mouth 4 (four) times daily.   Multiple Vitamin (MULTIVITAMIN WITH MINERALS) TABS tablet Take 1 tablet by mouth daily.   naltrexone (DEPADE) 50 MG tablet Take 1 tablet (50 mg total) by mouth daily.   norethindrone-ethinyl estradiol-FE (HAILEY FE 1/20) 1-20 MG-MCG tablet Take 1 tablet by mouth daily.   thiamine (VITAMIN B-1)  100 MG tablet Take 1 tablet (100 mg total) by mouth daily.   traZODone (DESYREL) 100 MG tablet Take 1 tablet (100 mg total) by mouth at bedtime.   [DISCONTINUED] busPIRone (BUSPAR) 15 MG tablet Take 1 tablet (15 mg total) by mouth 3 (three) times daily.   [DISCONTINUED] propranolol (INDERAL) 20 MG tablet TAKE ONE TABLET BY MOUTH THREE TIMES DAILY   [DISCONTINUED] QUEtiapine (SEROQUEL) 50 MG tablet TAKE ONE TABLET BY MOUTH DAILY AT BEDTIME   No facility-administered medications prior to visit.    Chief Complaint  Patient presents with   Follow-up    HPI  Needs medication refills Stopped following with [facility] and psychiatry Dr. Dema Severin- says he doesn't want to see her and she smoked weed and refused to admit in front of group so left [facility] She has sponsor she still seeing... But doesn't seem to be doing meetings 12 step Reports 84 days sober from alcohol.... just  a couple times smoked weed in that time Patient reports she having trouble sleeping, trazodone not working anymore. Her grandmother who is usually with her and very protective of her, didn't come today         Objective:  Physical Exam  BP 118/86 (BP Location: Left Arm, Patient Position: Sitting)   Pulse 86   Temp 98.1 F (36.7 C) (Temporal)   Ht 5' 5.5" (1.664 m)   Wt 192 lb 3.2 oz (87.2 kg)   LMP 08/22/2022 (Approximate)   SpO2 94%   BMI 31.50 kg/m  Obese  by BMI criteria but truncal adiposity (waist circumference or caliper) should be used instead. Wt Readings from Last 10 Encounters:  09/20/22 192 lb 3.2 oz (87.2 kg)  08/29/22 193 lb (87.5 kg)  08/17/22 196 lb 3.2 oz (89 kg)  08/03/22 191 lb 6.4 oz (86.8 kg)  07/20/22 195 lb 9.6 oz (88.7 kg)  07/11/22 187 lb 6.4 oz (85 kg)  07/04/22 181 lb 6.4 oz (82.3 kg)  06/26/22 150 lb (68 kg)  06/01/22 188 lb 6.4 oz (85.5 kg)  05/18/22 191 lb (86.6 kg)   Vital signs reviewed.  Nursing notes reviewed. Weight trend reviewed. General Appearance:  Well  developed, well nourished female in no acute distress.   Normal work of breathing at rest Musculoskeletal: All extremities are intact.  Neurological:  Awake, alert,  No obvious focal neurological deficits or cognitive impairments Psychiatric:  Appropriate mood, pleasant demeanor Problem-specific findings:  mental status and behavior same as in all prior visits       Signed: Loralee Pacas, MD 09/21/2022 7:18 AM

## 2022-09-25 ENCOUNTER — Other Ambulatory Visit: Payer: Self-pay | Admitting: Internal Medicine

## 2022-09-25 DIAGNOSIS — R112 Nausea with vomiting, unspecified: Secondary | ICD-10-CM

## 2022-09-25 NOTE — Telephone Encounter (Signed)
Patient requests to be called once RX for Ondansetron has been sent to Pharmacy

## 2022-09-26 ENCOUNTER — Ambulatory Visit: Payer: 59 | Admitting: Internal Medicine

## 2022-09-26 ENCOUNTER — Encounter: Payer: Self-pay | Admitting: Internal Medicine

## 2022-09-26 VITALS — BP 120/88 | HR 87 | Temp 98.0°F | Ht 65.5 in | Wt 195.6 lb

## 2022-09-26 DIAGNOSIS — F330 Major depressive disorder, recurrent, mild: Secondary | ICD-10-CM

## 2022-09-26 DIAGNOSIS — F17290 Nicotine dependence, other tobacco product, uncomplicated: Secondary | ICD-10-CM

## 2022-09-26 DIAGNOSIS — G8929 Other chronic pain: Secondary | ICD-10-CM

## 2022-09-26 DIAGNOSIS — F4001 Agoraphobia with panic disorder: Secondary | ICD-10-CM

## 2022-09-26 DIAGNOSIS — J4520 Mild intermittent asthma, uncomplicated: Secondary | ICD-10-CM

## 2022-09-26 DIAGNOSIS — F19939 Other psychoactive substance use, unspecified with withdrawal, unspecified: Secondary | ICD-10-CM

## 2022-09-26 DIAGNOSIS — R0683 Snoring: Secondary | ICD-10-CM

## 2022-09-26 DIAGNOSIS — Z8659 Personal history of other mental and behavioral disorders: Secondary | ICD-10-CM

## 2022-09-26 DIAGNOSIS — Z87898 Personal history of other specified conditions: Secondary | ICD-10-CM

## 2022-09-26 DIAGNOSIS — R441 Visual hallucinations: Secondary | ICD-10-CM

## 2022-09-26 DIAGNOSIS — F322 Major depressive disorder, single episode, severe without psychotic features: Secondary | ICD-10-CM

## 2022-09-26 DIAGNOSIS — F1994 Other psychoactive substance use, unspecified with psychoactive substance-induced mood disorder: Secondary | ICD-10-CM

## 2022-09-26 DIAGNOSIS — F22 Delusional disorders: Secondary | ICD-10-CM

## 2022-09-26 DIAGNOSIS — E669 Obesity, unspecified: Secondary | ICD-10-CM

## 2022-09-26 DIAGNOSIS — F411 Generalized anxiety disorder: Secondary | ICD-10-CM

## 2022-09-26 DIAGNOSIS — F1021 Alcohol dependence, in remission: Secondary | ICD-10-CM | POA: Diagnosis not present

## 2022-09-26 DIAGNOSIS — F1094 Alcohol use, unspecified with alcohol-induced mood disorder: Secondary | ICD-10-CM

## 2022-09-26 DIAGNOSIS — R440 Auditory hallucinations: Secondary | ICD-10-CM

## 2022-09-26 DIAGNOSIS — R110 Nausea: Secondary | ICD-10-CM

## 2022-09-26 DIAGNOSIS — R87610 Atypical squamous cells of undetermined significance on cytologic smear of cervix (ASC-US): Secondary | ICD-10-CM

## 2022-09-26 DIAGNOSIS — F4310 Post-traumatic stress disorder, unspecified: Secondary | ICD-10-CM

## 2022-09-26 DIAGNOSIS — R413 Other amnesia: Secondary | ICD-10-CM

## 2022-09-26 DIAGNOSIS — F32A Depression, unspecified: Secondary | ICD-10-CM

## 2022-09-26 DIAGNOSIS — D7589 Other specified diseases of blood and blood-forming organs: Secondary | ICD-10-CM

## 2022-09-26 DIAGNOSIS — D696 Thrombocytopenia, unspecified: Secondary | ICD-10-CM

## 2022-09-26 MED ORDER — BUPROPION HCL ER (SR) 100 MG PO TB12: 100.0000 mg | ORAL_TABLET | Freq: Every day | ORAL | 3 refills | Status: DC

## 2022-09-26 MED ORDER — DEXTROMETHORPHAN-BUPROPION ER 45-105 MG PO TBCR: 1.0000 | EXTENDED_RELEASE_TABLET | Freq: Every day | ORAL | 3 refills | Status: DC

## 2022-09-26 MED ORDER — DEXTROMETHORPHAN HBR 15 MG PO TABS: 3.0000 | ORAL_TABLET | Freq: Every day | ORAL | 5 refills | Status: DC

## 2022-09-26 MED ORDER — ONDANSETRON HCL 4 MG PO TABS: 4.0000 mg | ORAL_TABLET | Freq: Three times a day (TID) | ORAL | 0 refills | Status: DC | PRN

## 2022-09-26 NOTE — Progress Notes (Signed)
[name]: [phone]   Routine Medical Office Visit  Patient:  Natasha [place]      Age: 27 y.o.       Sex:  female  Date:   09/27/2022  PCP:    Loralee Pacas, [place] Provider: Loralee Pacas, MD   Assessment and Plan:   Sherika was seen today for discuss depression management.  Withdrawal from other psychoactive substance (HCC) Overview: Severe depression past 3 days out of proportion to triggers, suspicious for withdrawal from [place] that she recently ran out of.  Also recent change of seroquel considered but we went 50->100 and its helping her sleep and usually helps severe depression    Assessment & Plan: Severe, but she commits to safety and grandmother agreed to be safety plan support and she will return to office 2 days Sent in [place] Return to office 2 days 30 min counseling for this and coordinating with grandmother independent historian and safety plan on phone    Alcohol use disorder, severe, in early remission, dependence ([place]) Overview: Longstanding severe making good progress in recovery as of September and October 2023 then major setback with binge->withdrawal seizure 06/26/22 H/o multiple seizures in 2020 Has done I/p rehab [facility] was good - have had challenges getting o/p support follow up but does 12 steps with sponsor H/o failing gabapentin and naltrexone - taking gabapentin but not naltrexone  now Good support from grandmother, bf, and 12-step sponsor and ladies in [place]     History of seizure due to alcohol withdrawal  Depression, major, single episode, severe ([place]) -     Dextromethorphan-buPROPion ER; Take 1 tablet by mouth daily.  Dispense: 90 tablet; Refill: 3 -     Dextromethorphan HBr; Take 3 tablets by mouth daily at 6 (six) AM.  Dispense: 90 tablet; Refill: 5 -     buPROPion HCl ER (SR); Take 1 tablet (100 mg total) by mouth daily. Start 100 mg PO BID, increase after 3 days  to 100 mg PO TID.  Dispense: 90 tablet; Refill: 3  Nausea -     Ondansetron HCl; Take 1 tablet (4 mg total) by mouth every 8 (eight) hours as needed for nausea or vomiting.  Dispense: 20 tablet; Refill: 0      Clinical Presentation:   The patient is a 27 y.o. female: Active Ambulatory Problems    Diagnosis Date Noted   Nicotine dependence due to vaping tobacco product 01/31/2018   Asthma 01/31/2018   GAD (generalized anxiety disorder) 01/31/2018   ASCUS of cervix with negative high risk HPV 03/07/2018   Macrocytosis 05/19/2018   Substance induced mood disorder ([place]) 12/09/2018   Alcohol use with alcohol-induced mood disorder ([place])    Alcohol use disorder, severe, in early remission, dependence ([place]) 12/27/2018   Recurrent major depressive disorder ([place]) 12/27/2018   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/2019   Delusional disorder ([place])    Auditory hallucinations 01/24/2021   Visual hallucination 01/24/2021   Chronic abdominal pain 01/24/2021   PTSD (post-traumatic stress disorder) 03/19/2022   Memory loss 04/03/2022   Snoring 04/17/2022   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 04/17/2022   Withdrawal syndrome ([place]) 09/27/2022   Resolved Ambulatory Problems    Diagnosis Date Noted   Acute pyelonephritis 05/19/2018   Sepsis ([place]) 05/19/2018   Hypokalemia 05/19/2018   Hypocalcemia 05/19/2018   Hypomagnesemia 05/19/2018   AKI (acute kidney injury) ([place]) 05/19/2018   Electrolyte disturbance 05/19/2018   Thrombocytopenia (  [place]) 05/19/2018   Alcohol use 05/19/2018   Alcohol abuse 12/09/2018   Alcohol withdrawal ([place]) 12/15/2018   Seizure ([place]) 12/26/2018   Alcohol withdrawal ([place]) 01/10/2019   Alcohol abuse with alcohol-induced mood disorder ([place]) 01/16/2019   Alcohol intoxication with moderate or severe use disorder ([place]) 01/16/2019   Cocaine use disorder, moderate, in early remission ([place]) 08/13/2019   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   Seizures ([place])  10/03/2020   Depression 10/03/2020   Alcohol use 10/03/2020   Transient alteration of awareness 01/24/2021   Hypokalemia 02/28/2021   History of seizure due to alcohol withdrawal 02/28/2021   Acute encephalopathy 06/27/2022   Nausea vomiting and diarrhea 06/27/2022   Depression with anxiety 06/27/2022   History of alcohol withdrawal delirium 07/04/2022   Flu-like symptoms 08/03/2022   Acute sinusitis 08/03/2022   Past Medical History:  Diagnosis Date   Anxiety    Pyelonephritis    Seizure-like activity ([place])     Outpatient Medications Prior to Visit  Medication Sig   acamprosate (CAMPRAL) 333 MG tablet Take 2 tablets (666 mg total) by mouth 3 (three) times daily with meals.   busPIRone (BUSPAR) 15 MG tablet Take 1 tablet (15 mg total) by mouth 3 (three) times daily. Must be taken consistently.   DULoxetine (CYMBALTA) 30 MG capsule TAKE ONE CAPSULE BY MOUTH TWICE DAILY   gabapentin (NEURONTIN) 300 MG capsule Take 1 capsule (300 mg total) by mouth 3 (three) times daily.   methocarbamol (ROBAXIN) 500 MG tablet Take 1 tablet (500 mg total) by mouth 4 (four) times daily.   Multiple Vitamin (MULTIVITAMIN WITH MINERALS) TABS tablet Take 1 tablet by mouth daily.   naltrexone (DEPADE) 50 MG tablet Take 1 tablet (50 mg total) by mouth daily.   norethindrone-ethinyl estradiol-FE (HAILEY FE 1/20) 1-20 MG-MCG tablet Take 1 tablet by mouth daily.   ondansetron (ZOFRAN-ODT) 4 MG disintegrating tablet Dissolve 1 tablet by mouth every 8 hours as needed for nausea or vomiting for nausea from wegovy or other source   propranolol (INDERAL) 20 MG tablet Take 1 tablet (20 mg total) by mouth 3 (three) times daily.   QUEtiapine (SEROQUEL) 100 MG tablet Take 1 tablet (100 mg total) by mouth at bedtime. Replaces 50 mg dosing but please limit to minimum necessary   thiamine (VITAMIN B-1) 100 MG tablet Take 1 tablet (100 mg total) by mouth daily.   topiramate (TOPAMAX) 25 MG tablet Take 1 tablet (25 mg total)  by mouth 2 (two) times daily.   traZODone (DESYREL) 100 MG tablet Take 1 tablet (100 mg total) by mouth at bedtime.   [DISCONTINUED] AUVELITY 45-105 MG TBCR TAKE ONE (1) TABLET BY MOUTH TWICE A DAY   No facility-administered medications prior to visit.     Chief Complaint  Patient presents with   Discuss depression management    Has been very depressed for the last three days, not sleeping and up crying alot. Trying to be proactive and prevent a worse event from happening. Didn't get the Odansetron or weight loss medication from last visit (pharmacy was having an issue with their electronic system) .    HPI   27 y.o. female had concerns including Discuss depression management (Has been very depressed for the last three days, not sleeping and up crying alot. Trying to be proactive and prevent a worse event from happening. Didn't get the Odansetron or weight loss medication from last visit (pharmacy was having an issue with their electronic system) .). Very  sad past 3 days Sleeping better with boost in seroquel from 50-100 Has had suicidal ideation but commits to safety- just feels real down due to anniversary of friends death. But she can't really put her finger on what is causing it because nothing is wrong. Lost 4 people in a year. Today appointment is to try to get help with this, not interested in voluntary hospitalization. Feels like more depression than grief. Crying all day every day for 3 days        Clinical Data Analysis:  Physical Exam  BP 120/88 (BP Location: Left Arm, Patient Position: Sitting)   Pulse 87   Temp 98 F (36.7 C) (Temporal)   Ht 5' 5.5" (1.664 m)   Wt 195 lb 9.6 oz (88.7 kg)   LMP 08/22/2022 (Approximate)   SpO2 98%   BMI 32.05 kg/m  Wt Readings from Last 10 Encounters:  09/26/22 195 lb 9.6 oz (88.7 kg)  09/20/22 192 lb 3.2 oz (87.2 kg)  08/29/22 193 lb (87.5 kg)  08/17/22 196 lb 3.2 oz (89 kg)  08/03/22 191 lb 6.4 oz (86.8 kg)  07/20/22 195 lb 9.6  oz (88.7 kg)  07/11/22 187 lb 6.4 oz (85 kg)  07/04/22 181 lb 6.4 oz (82.3 kg)  06/26/22 150 lb (68 kg)  06/01/22 188 lb 6.4 oz (85.5 kg)   Vital signs reviewed.  Nursing notes reviewed. Weight trend reviewed. Abnormalities noted: stable Body mass index is 32.05 kg/m.  Obese  by BMI criteria is noted but in my medical opinion, BMI is an unreliable indicator of healthy body composition due to its inability to reflect lean muscle mass.  General Appearance:  Well developed, well nourished female in no acute distress.   Pulmonary:  Normal work of breathing at rest, no respiratory distress apparent. SpO2: 98 %  Musculoskeletal: All extremities are intact.  Neurological:  Awake, alert. No obvious focal neurological deficits or cognitive impairments.  Sensorium seems unclouded. Psychiatric:  tearful and sad, pleasant demeanor Problem-specific findings:  tearful, remorseful   Results Reviewed: (reviewed labs/imaging may be also be found in the assessment / plan section):  No results found for any visits on 09/26/22.  Recent Results (from the past 2160 hour(s))  POCT rapid strep A     Status: Normal   Collection Time: 08/03/22  2:15 PM  Result Value Ref Range   Rapid Strep A Screen Negative Negative  POCT Influenza A/B     Status: Normal   Collection Time: 08/03/22  2:15 PM  Result Value Ref Range   Influenza A, POC Negative Negative   Influenza B, POC Negative Negative  POC COVID-19     Status: Normal   Collection Time: 08/03/22  2:16 PM  Result Value Ref Range   SARS Coronavirus 2 Ag Negative Negative  Urinalysis, Routine w reflex microscopic     Status: Abnormal   Collection Time: 08/03/22  2:32 PM  Result Value Ref Range   Color, Urine ORANGE (A) Yellow;Lt. Yellow;Straw;Dark Yellow;Amber;Green;Red;Brown   APPearance SL CLOUDY (A) Clear;Turbid;Slightly Cloudy;Cloudy   Specific Gravity, Urine 1.015 1.000 - 1.030   pH 8.0 5.0 - 8.0   Total Protein, Urine 30 (A) Negative   Urine  Glucose NEGATIVE Negative   Ketones, ur 15 (A) Negative   Bilirubin Urine SMALL (A) Negative   Hgb urine dipstick NEGATIVE Negative   Urobilinogen, UA 2.0 (A) 0.0 - 1.0   Leukocytes,Ua NEGATIVE Negative   Nitrite NEGATIVE Negative   WBC, UA 0-2/hpf 0-2/hpf  RBC / HPF 0-2/hpf 0-2/hpf   Squamous Epithelial / HPF Many(>10/hpf) (A) Rare(0-4/hpf)   Bacteria, UA Few(10-50/hpf) (A) None   Epithelial Casts, UA Presence of (A) None  Lipase     Status: Abnormal   Collection Time: 08/03/22  2:36 PM  Result Value Ref Range   Lipase 6 (L) 7 - 60 U/L    Comment: Verified by repeat analysis. .   Comp Met (CMET)     Status: Abnormal   Collection Time: 08/03/22  2:36 PM  Result Value Ref Range   Glucose, Bld 114 (H) 65 - 99 mg/dL    Comment: .            Fasting reference interval . For someone without known diabetes, a glucose value between 100 and 125 mg/dL is consistent with prediabetes and should be confirmed with a follow-up test. .    BUN 4 (L) 7 - 25 mg/dL   Creat 0.74 0.50 - 0.96 mg/dL   BUN/Creatinine Ratio 5 (L) 6 - 22 (calc)   Sodium 135 135 - 146 mmol/L   Potassium 3.4 (L) 3.5 - 5.3 mmol/L   Chloride 97 (L) 98 - 110 mmol/L   CO2 18 (L) 20 - 32 mmol/L   Calcium 9.1 8.6 - 10.2 mg/dL   Total Protein 7.9 6.1 - 8.1 g/dL   Albumin 4.5 3.6 - 5.1 g/dL   Globulin 3.4 1.9 - 3.7 g/dL (calc)   AG Ratio 1.3 1.0 - 2.5 (calc)   Total Bilirubin 0.7 0.2 - 1.2 mg/dL   Alkaline phosphatase (APISO) 69 31 - 125 U/L   AST 21 10 - 30 U/L   ALT 18 6 - 29 U/L  CBC     Status: Abnormal   Collection Time: 08/03/22  2:36 PM  Result Value Ref Range   WBC 12.4 (H) 3.8 - 10.8 Thousand/uL   RBC 4.09 3.80 - 5.10 Million/uL   Hemoglobin 13.7 11.7 - 15.5 g/dL   HCT 39.2 35.0 - 45.0 %   MCV 95.8 80.0 - 100.0 fL   MCH 33.5 (H) 27.0 - 33.0 pg   MCHC 34.9 32.0 - 36.0 g/dL   RDW 15.6 (H) 11.0 - 15.0 %   Platelets 285 140 - 400 Thousand/uL   MPV 10.8 7.5 - 12.5 fL  Amylase     Status: None    Collection Time: 08/03/22  2:36 PM  Result Value Ref Range   Amylase 66 21 - 101 U/L  Cytology - PAP( [place])     Status: None   Collection Time: 08/29/22 11:37 AM  Result Value Ref Range   High risk HPV Negative    Neisseria Gonorrhea Negative    Chlamydia Negative    Trichomonas Negative    Adequacy      Satisfactory for evaluation; transformation zone component PRESENT.   Diagnosis      - Negative for intraepithelial lesion or malignancy (NILM)   Microorganisms Shift in flora suggestive of bacterial vaginosis    Comment Normal Reference Range HPV - Negative    Comment Normal Reference Range Trichomonas - Negative    Comment Normal Reference [place] Chlamydia - Negative    Comment      Normal Reference Range Neisseria Gonorrhea - Negative  RPR     Status: None   Collection Time: 08/29/22 11:50 AM  Result Value Ref Range   RPR Ser Ql NON-REACTIVE NON-REACTIVE    Comment: . No laboratory evidence of syphilis. If recent exposure is suspected,  submit a new sample in 2-4 weeks. Marland Kitchen   HIV Antibody (routine testing w rflx)     Status: None   Collection Time: 08/29/22 11:50 AM  Result Value Ref Range   HIV 1&2 Ab, 4th Generation NON-REACTIVE NON-REACTIVE    Comment: HIV-1 antigen and HIV-1/HIV-2 antibodies were not detected. There is no laboratory evidence of HIV infection. Marland Kitchen PLEASE NOTE: This information has been disclosed to you from records whose confidentiality may be protected by state law.  If your state requires such protection, then the state law prohibits you from making any further disclosure of the information without the specific written consent of the person to whom it pertains, or as otherwise permitted by law. A general authorization for the release of medical or other information is NOT sufficient for this purpose. . For additional information please refer to http://education.questdiagnostics.com/faq/FAQ106 (This link is being provided for  informational/ educational purposes only.) . Marland Kitchen The performance of this assay has not been clinically validated in patients less than 38 years old. .   Hepatitis C antibody     Status: None   Collection Time: 08/29/22 11:50 AM  Result Value Ref Range   Hepatitis C Ab NON-REACTIVE NON-REACTIVE    Comment: . HCV antibody was non-reactive. There is no laboratory  evidence of HCV infection. . In most cases, no further action is required. However, if recent HCV exposure is suspected, a test for HCV RNA (test code [id number]) is suggested. . For additional information please refer to http://education.questdiagn[facility].com/faq/FAQ22v1 (This link is being provided for informational/ educational purposes only.) .     Physician Time-Spent:  40 minutes of total time was spent on the date of this encounter performing the following actions: chart review while seeing the patient, obtaining history, performing a medically necessary exam, counseling on the treatment plan, placing orders, and documenting in our EHR.   .  This extended time spent was medically necessary because severe depression flare required implementing a safety plan and addition office-based behavioral health counseling by physician .        Signed: Loralee Pacas, MD 09/27/2022 10:54 AM

## 2022-09-27 ENCOUNTER — Telehealth: Payer: Self-pay | Admitting: Internal Medicine

## 2022-09-27 ENCOUNTER — Encounter: Payer: Self-pay | Admitting: Internal Medicine

## 2022-09-27 DIAGNOSIS — F19939 Other psychoactive substance use, unspecified with withdrawal, unspecified: Secondary | ICD-10-CM

## 2022-09-27 HISTORY — DX: Other psychoactive substance use, unspecified with withdrawal, unspecified: F19.939

## 2022-09-27 NOTE — Telephone Encounter (Signed)
Patient was seen for an OV yesterday and this was taken care of.

## 2022-09-27 NOTE — Assessment & Plan Note (Signed)
Severe, but she commits to safety and grandmother agreed to be safety plan support and she will return to office 2 days Sent in [place] Return to office 2 days 30 min counseling for this and coordinating with grandmother independent historian and safety plan on phone

## 2022-09-27 NOTE — Telephone Encounter (Signed)
Patient states [facility] doesn't have Dextromethorphan-buPROPion ER 45-105 MG TBCR . Requests other recommendations by PCP. She will be in to see PCP on 09/28/22. States she really needs the medication.

## 2022-09-28 ENCOUNTER — Encounter: Payer: Self-pay | Admitting: Internal Medicine

## 2022-09-28 ENCOUNTER — Ambulatory Visit: Payer: 59 | Admitting: Internal Medicine

## 2022-09-28 VITALS — BP 108/80 | HR 91 | Temp 98.2°F | Ht 65.5 in

## 2022-09-28 DIAGNOSIS — F322 Major depressive disorder, single episode, severe without psychotic features: Secondary | ICD-10-CM

## 2022-09-28 MED ORDER — DEXTROMETHORPHAN HBR 15 MG PO TABS: 3.0000 | ORAL_TABLET | Freq: Every day | ORAL | 5 refills | Status: DC

## 2022-09-28 MED ORDER — BUPROPION HCL ER (XL) 150 MG PO TB24: 150.0000 mg | ORAL_TABLET | Freq: Two times a day (BID) | ORAL | 3 refills | Status: DC

## 2022-09-28 NOTE — Telephone Encounter (Signed)
Patient was seen for OV today. Completed prior authorization for this medication.

## 2022-09-28 NOTE — Assessment & Plan Note (Signed)
We weren't able to get Auvelity filled Completed prior authorization Sent individual medications to [facility] [address] so it would be easier for her to get.

## 2022-09-28 NOTE — Assessment & Plan Note (Signed)
>>  ASSESSMENT AND PLAN FOR DEPRESSION, MAJOR, SINGLE EPISODE, SEVERE (HCC) WRITTEN ON 09/28/2022 12:57 PM BY [name], MD  We weren't able to get Auvelity filled Completed prior authorization Sent individual medications to [facility] [address] so it would be easier for her to get.

## 2022-09-28 NOTE — Patient Instructions (Addendum)
Auvelity, an antidepressant medicine used to treat Major Depressive Disorder (MDD), contains two active ingredients:  Bupropion: This component belongs to a class of medicines called NDRIs (norepinephrine and dopamine reuptake inhibitors). Bupropion has an antidepressant effect by affecting neurotransmitters (chemical messengers) like norepinephrine and dopamine in the brain.  Dextromethorphan: Dextromethorphan is part of a drug class called N-methyl D-aspartate (NMDA) receptor antagonists (which means it blocks these receptors). Additionally, it acts as an agonist at the sigma-1 receptors (stimulating these receptors). These effects contribute to improving depressive symptoms.  Bupropion also inhibits the enzyme CYP2D6, which prevents dextromethorphan from breaking down, leading to higher and longer-lasting dextromethorphan levels in the body.  [facility] [address] [place].
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
## 2022-10-01 ENCOUNTER — Telehealth: Payer: Self-pay | Admitting: Internal Medicine

## 2022-10-01 ENCOUNTER — Other Ambulatory Visit: Payer: Self-pay

## 2022-10-01 ENCOUNTER — Encounter: Payer: Self-pay | Admitting: Internal Medicine

## 2022-10-01 ENCOUNTER — Ambulatory Visit: Payer: 59 | Admitting: Internal Medicine

## 2022-10-01 MED ORDER — ARIPIPRAZOLE 10 MG PO TABS: 10.0000 mg | ORAL_TABLET | Freq: Every day | ORAL | 0 refills | Status: DC

## 2022-10-01 NOTE — Telephone Encounter (Signed)
Pt would like a call back about an RX she wants to start again. Please advise.

## 2022-10-03 ENCOUNTER — Ambulatory Visit: Payer: 59 | Admitting: Internal Medicine

## 2022-10-03 ENCOUNTER — Encounter: Payer: Self-pay | Admitting: Internal Medicine

## 2022-10-03 VITALS — BP 98/72 | HR 85 | Temp 98.0°F | Ht 65.5 in | Wt 193.4 lb

## 2022-10-03 DIAGNOSIS — R440 Auditory hallucinations: Secondary | ICD-10-CM

## 2022-10-03 DIAGNOSIS — Z599 Problem related to housing and economic circumstances, unspecified: Secondary | ICD-10-CM | POA: Diagnosis not present

## 2022-10-03 DIAGNOSIS — F322 Major depressive disorder, single episode, severe without psychotic features: Secondary | ICD-10-CM

## 2022-10-03 MED ORDER — ARIPIPRAZOLE 2 MG PO TABS: 2.0000 mg | ORAL_TABLET | Freq: Every day | ORAL | 3 refills | Status: DC

## 2022-10-03 NOTE — Patient Instructions (Signed)
[place]. Address: [address], [place], [place], [address] Phone: [phone] Services: Individual Counseling, Family Therapy, Couples Counseling, Trauma Therapy, Stress Management, Depression Treatment, Anxiety Treatment, and Grief Counseling   Madalyn Rob, [facility] Address: [place], [place], [place], [address] Phone: [phone] Specializes in EMDR, PTSD, anxiety, cognitive behavioral therapy, and other disorders   [facility] Address: [place], [place], [place], [address] Phone: [phone] Offers professional counseling specializing in Complex Trauma and Dissociative Disorders, mood disorders, personality disorders, and EMDR 3.  [facility]. Address: [address], [place], [place], [address] Phone: [phone] Specializes in trauma, addiction, and perinatal therapy services 4.  [facility] [address] [place], [place] [address] [phone] --------------------------------------------------------------------------------------  [place], [place]: Transportation Resources   Individualized Support: [place]: Statewide resource hotline connecting people with transportation resources. Dial 211 or visit PokerAddress.es.  Dial 2-1-1 or 647-559-0952. They can help you find transportation options in your area based on your specific needs.  Public Transportation:  [employer] (GTA): Operates a fixed-route bus system with over 19 routes serving most areas of [place]. Offers real-time bus tracking and trip planning tools through their website and app. Provides ADA-compliant buses with ramps and designated seating for individuals with disabilities. Sells day passes, weekly passes, and monthly passes to make riding more affordable. Offers reduced fares for seniors (65+) and individuals with disabilities who meet eligibility  requirements.   Offers paratransit SCAT (Shared-Ride Cab Alternative): for eligible riders with disabilities who cannot use fixed-route buses due to their disability. Phone number: [phone] https://www.[place].gov/departments/transit  [place] curb-to-curb bus service. Eligibility: Open to all. FeesVary, depending on service and route. There is no fee for people age 30 and over. Visit website to download application or call to have one mailed. Call to check for eligibility. BingoPublishing.hu Phone: [phone] (Toll-Free) or [phone] (Main)  PART ([place]):  Operates commuter bus routes connecting [place], [place], [place], and other areas in the [place] region.  Offers weekday service with limited Saturday service on some routes.  JokeRule.co.nz  Phone number: [phone]   Volunteer Transportation Services:  Chemical engineer (VTG): Provides non-emergency medical transportation services for seniors and individuals with disabilities who have difficulty accessing traditional transportation means. Serves residents of [place], [place]. Offers rides to medical appointments, grocery shopping, and other essential errands. May require scheduling rides in advance due to volunteer availability. Contact VTG through their website.  https://volunteergso.CheapToothpicks.si   [employer] (GUM): Offers transportation assistance for some programs and services they provide, such as their food pantry or medical clinic. Availability and eligibility might vary depending on the specific program. Contact GUM directly for details on transportation assistance for their programs.  https://www.greensborourbanministry.org/ Phone number:  [phone]  [place] Programs:  [place] (DSS): May offer transportation vouchers or reimbursements for medical appointments in certain circumstances, particularly for Medicaid recipients. Eligibility depends on individual circumstances, medical needs, and program availability. Contact DSS for details on eligibility requirements and the application process. VariantTest.co.uk [employer] or religious charities:  Some faith-based or local community organizations may offer limited ride-sharing assistance for essential needs like medical appointments or grocery shopping. Availability and eligibility criteria vary greatly. Research local organizations in your area to see if they offer transportation assistance programs.  [place]  Medicare  Medicare itself typically  does not directly cover non-emergency medical transportation [facility]) for doctor appointments.  This means Original Medicare (Parts A & B) usually won't pay for rides to routine checkups or doctor visits.  However, there are some exceptions:  Ambulance transport in emergencies would likely be covered by Medicare Part B. Some Medicare Advantage Plans (Part C): These are private insurance plans that may cover non-emergency medical transportation as part of their benefits package. It's important to check with your specific Medicare Advantage plan to see if NEMT is covered and what the limitations might be.  Medicaid: Medicaid programs are administered by each state, so coverage can vary. However, Medicaid generally does cover non-emergency medical transportation for eligible individuals to get to and from doctor's appointments for Medicaid-covered services. This means Medicaid may pay for rides to [facility], hospitals, or other healthcare providers for approved  treatments. Here are some resources to learn more:  Medicare Transportation: https://www.ehealthinsurance.com/medicare/ Let Medicaid Give You a Ride: http://www.lawrence.com/   Remember:  Always  check with your specific health insurance plan (Medicare Advantage, commercial, or Medicaid) to confirm  coverage details for non-emergency medical transportation.**   Food Resources   Information on Locations: [place]: [employer] connecting people with various assistance programs, including food support. Dial 2-1-1 or [phone] to speak with a representative for personalized guidance on finding food resources. PokerAddress.es Feeding America: Warden/ranger with a network of food banks across the country. Their website allows you to search for food pantries and meal programs by zip code. https://www.GolfCrawler.com.cy [place]: GroupRules.gl  keeps an [place] regionally [place] (DSS): VariantTest.co.uk http://knight.com/ Provides monthly benefits on an EBT card to help buy food.    Soup [employer] and [employer] in [place]: Free Indeed Outreach Ministry [place] [address] [phone] M - F: times and dates vary - call agency  [place]:  [address] [place] [address] [phone] a free community meal program along with other support services  [employer] [address] [phone] Provides hot meals, groceries, and clothing [employer] Dish and [place]  [phone] Www.fpcg[place]boro.org for mealtimes  [employer] Potter's Western & Southern Financial Pantries in [place]: GroupRules.gl  keeps an [employer] of the food services regionally Farmer of His [place] Sa: 10a - 12p  on varying weeks; call agency for schedule  [facility] [place] [phone] M - F: 9:30a - 3:30p, eligibility requirements  The [place] [phone] Tu: 9a - 12p; Th 9a - 12p  Serina Cowper of Praise [address] [phone] Tu : 10a - 12p; W: 10a - 12p; Th: 10a - 12p  [facility] [address] [phone] Tu: [place]; ThAnders [place] - 11a  The [facility] [address] [phone] 1st & 3rd Sa: [place] by appointment Sa: 10:30a - 11:30a  Out of the [place] [address] [place] [address] [place] [phone] mobile distribution sites; call agency for listing  MBL-Out of the [place] [address] [place] [phone] mobile distribution  sites; call agency for listing  MBL-Out of the [place] [address] [place] [phone] mobile distribution sites; call agency for listing  [facility] [address] [phone] W: [place] [address] [phone] 1st & 3rd Tu: 10a - 1p; 3rd Sa: 10a - 12p  One Step Further [place] [phone] M: 9:30a - 2:30p; Tu: 9:30a - 2:30p; W: 9:30a - 2:30p; Th: 9:30a - 2:30p  One Step Further [address] [phone] F: 11a - 2p  [place] Pantry [phone] [address] Hutching [id number] 2nd & 4th Th: 1p - 2:30p  [place] [address] [phone] T: 12:p -2p  Free Indeed Food Pantry [place] 3rd Sat of month: 11a-1p   [employer] Pantry [place] 4th Sa: [place] [address] [place] 3rd Tu: 5p - 7p; 3rd Sa: [place] of Our Father [place]ger [place] [phone] 2nd M 5:30p - 7:30p; each W 9:30a - 11:30a  [facility] of [place] [phone] Tues 11a-1p call for appointment preferred  MBL - [facility] [address] [phone] mobile distribution sites; call agency for listing  Positive [place] for Youth & Families [address] [id number] M & W 6:30p - 8:30p  Pop up on Th 11a - 12:30p at different locations (call)  MBL-PD&Y [place] Thursdays 11a - 12p (please call for location)  [facility] Grocery GiveAway [address] [place] [phone] weekly on [address] [place] [phone] 4th Saturday: [place] [facility] [place] M-F 8am-5pm (by appt)  [facility] [place], [address] [place] [phone] M-F 8:30am-5:00pm (by appt)  [address] [place] [phone] 2nd & 4th Saturday 11am-1pm  We Are One [place] [facility] [phone] Fridays 11am-2pm  [facility] Police Dept [id number] [phone] Call for schedule  [place]fer [place] [id number], 3rd Thursday 2:30-3:30  [place] [id number] Thursdays 10am-12:30pm  [facility] [place] [id number] Sa: 9a-12p (2nd and 4th Saturday of each month)  Bread of Life [place] [phone] of [address] [place]  Every third [place] [facility] [place]  [address]  ([phone]) [phone].   Tuesdays from 5:30 PM to 7:30 PM and Saturdays from 10:00 AM to 12:00 PM.  Mobile Market ([place]  [place] Food pantry is healthy food, they want to get appointments on healthy eating.   Mobile Food Pantries: Higher education careers adviser (Out of the [place] [employer]): Offers a Financial risk analyst with fresh produce, bread, meat, and non-perishable food. (Address: [place], [place], [place] - Phone number not available) [place], [place]): Wednesdays from 11:30 AM to 1:00 PM. Phone number: [phone] (https://www.freefood.org/l/[place]-presbyterian-church) Bread of Life Food Bank ([place], [place]): Located at  [address] [place], [place] [address]. Phone number: [phone]. Family Market ([place], [place]): Located at [address], [place], [place] [address]. Phone number: [phone]. Tuesdays from 5:30 PM to 7:30 PM and Saturdays from 10:00 AM to 12:00 PM. Database administrator of [employer]: While not located in [place], they offer a Financial risk analyst program that may serve some [place] residents. Contact for details on eligibility and service areas. - Phone number: [phone] (https://[email or web address]/in-home-senior-care-services/)

## 2022-10-04 DIAGNOSIS — Z599 Problem related to housing and economic circumstances, unspecified: Secondary | ICD-10-CM | POA: Insufficient documentation

## 2022-10-04 NOTE — Telephone Encounter (Signed)
Patient has already been seen for an OV concerning this.

## 2022-10-04 NOTE — Assessment & Plan Note (Addendum)
  At her last visit,  we did prior authorization and sent medications to specific pharmacy as individual subcomponents of Auvelity so we could work around transport social determinants of health issues  Main issue today is that her severe depression has improved a lot with reconstituted Auvelity we got her a few days ago.... But she really wants to continue Abilify since it controls hallucinations better than seroquel, and wants to continue seroquel as well despite dyskinesia risks. Severe, associated with Auvelity withdrawal  Seems to be resolving, feels improved substantially since taking reconstituted Auvelity- awaiting prior authorization for brand Auvelity Suicidal ideation now totally gone "everything fine, I'm good, I'm starting to feel better, drinking a lot of water" She has some Cymbalta that she started taking recently and she has been wanting to restart Abilify both of these meds were from [place] and they were very helpful in the past but she is on a lot of other medicines for interactions.  The hallucinations have been coming back a little bit since she has been off Abilify and reports she reports that Abilify is what she was on 2 to get rid of them.  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  It will maybe take 6 to 18 months before or safe without taking medicine so for in the meantime to reduce the anxiety caused by hallucinations I do recommend continuing on antipsychotic medications like Abilify or Seroquel.  The problem with that is weight gain and really these meds should be managed by a psychiatrist and she is recently lost access to her psychiatrist so I am going to try to get her to a new 1.  In the meantime I am going to try to get the problem under control I advise going on Seroquel only instead of doing  it Abilify plus Seroquel combo and that is because Seroquel alone is less likely to cause the dyskinesia that she is reporting.

## 2022-10-04 NOTE — Assessment & Plan Note (Addendum)
Agreed to Abilify add on, at low dose, reluctantly, after extensive verbal informed consent documented above in overview. Social determinants of health have interfered with behavioral health counselor and psychiatry follow up so I'm left managing all of these roles, but I encourage her to re-establish.

## 2022-10-04 NOTE — Assessment & Plan Note (Signed)
>>  ASSESSMENT AND PLAN FOR DEPRESSION, MAJOR, SINGLE EPISODE, SEVERE (HCC) WRITTEN ON 10/04/2022  3:02 PM BY [name], MD   At her last visit,  we did prior authorization and sent medications to specific pharmacy as individual subcomponents of Auvelity so we could work around transport social determinants of health issues  Main issue today is that her severe depression has improved a lot with reconstituted Auvelity we got her a few days ago.... But she really wants to continue Abilify since it controls hallucinations better than seroquel, and wants to continue seroquel as well despite dyskinesia risks. Severe, associated with Auvelity withdrawal  Seems to be resolving, feels improved substantially since taking reconstituted Auvelity- awaiting prior authorization for brand Auvelity Suicidal ideation now totally gone "everything fine, I'm good, I'm starting to feel better, drinking a lot of water" She has some Cymbalta that she started taking recently and she has been wanting to restart Abilify both of these meds were from [place] and they were very helpful in the past but she is on a lot of other medicines for interactions.  The hallucinations have been coming back a little bit since she has been off Abilify and reports she reports that Abilify is what she was on 2 to get rid of them.  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  It will maybe take 6 to 18 months before or safe without taking medicine so for in the meantime to reduce the anxiety caused by hallucinations I do recommend continuing on antipsychotic medications like Abilify or Seroquel.  The problem with that is weight gain and really these meds should be managed by a psychiatrist and she is recently lost access to her psychiatrist so I am going to try to get  her to a new 1.  In the meantime I am going to try to get the problem under control I advise going on Seroquel only instead of doing it Abilify plus Seroquel combo and that is because Seroquel alone is less likely to cause the dyskinesia that she is reporting.

## 2022-10-04 NOTE — Progress Notes (Signed)
[name]: [phone]   Routine Medical Office Visit  Patient:  Natasha [place]      Age: 27 y.o.       Sex:  female  Date:   10/04/2022  PCP:    Loralee Pacas, [place] Provider: Loralee Pacas, MD   Assessment and Plan:   [name] was seen today for 2 week follow-up.  Depression, major, single episode, severe ([place]) Overview:     Assessment & Plan:  At her last visit,  we did prior authorization and sent medications to specific pharmacy as individual subcomponents of Auvelity so we could work around transport social determinants of health issues  Main issue today is that her severe depression has improved a lot with reconstituted Auvelity we got her a few days ago.... But she really wants to continue Abilify since it controls hallucinations better than seroquel, and wants to continue seroquel as well despite dyskinesia risks. Severe, associated with Auvelity withdrawal  Seems to be resolving, feels improved substantially since taking reconstituted Auvelity- awaiting prior authorization for brand Auvelity Suicidal ideation now totally gone "everything fine, I'm good, I'm starting to feel better, drinking a lot of water" She has some Cymbalta that she started taking recently and she has been wanting to restart Abilify both of these meds were from [facility] and they were very helpful in the past but she is on a lot of other medicines for interactions.  The hallucinations have been coming back a little bit since she has been off Abilify and reports she reports that Abilify is what she was on 2 to get rid of them.  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  It will maybe take 6 to 18 months before or safe without taking medicine so for in the meantime to  reduce the anxiety caused by hallucinations I do recommend continuing on antipsychotic medications like Abilify or Seroquel.  The problem with that is weight gain and really these meds should be managed by a psychiatrist and she is recently lost access to her psychiatrist so I am going to try to get her to a new 1.  In the meantime I am going to try to get the problem under control I advise going on Seroquel only instead of doing it Abilify plus Seroquel combo and that is because Seroquel alone is less likely to cause the dyskinesia that she is reporting.  Orders: -     ARIPiprazole; Take 1 tablet (2 mg total) by mouth daily.  Dispense: 90 tablet; Refill: 3  Financial difficulties Overview: As of 10/04/22 she is living alone in a hotel financed by her grandmother and has no transportation.   She has no income.  She has no other social support. Very severe risks from social determinants of health standpoint.  Assessment & Plan: Gave her extensive information on transportation and food resources,  see AVS     Flu-like symptoms  Auditory hallucinations Overview: Recurrent Poorly controlled by current regimen due to loss of Abilify Significant tardive dyskinesia risk by combining Abilify/seroquel, but she is insistent  During our consultation on 10/04/2022, while exploring treatment options that align with her values/preferences, we had a detailed discussion of the risks of using Abilify added to seroquel to manage hallucinations, particularly dyskinesias and tardive dyskinesia.  To facilitate shared decision-making, I used motivational interviewing, but respected her autonomy for self-directed care while adhering to professional standards and ethical principles.  I ensured: her understanding of the additional risks of the non-recommended approach by asking clarifying questions and summarizing key points. her capacity to evaluate these risks and decide for herself - she demonstrated the ability  to understand information, appreciate consequences, and make rational decisions today. Despite my recommendations, she continued to prefer her chosen treatment. Following these discussions, I agreed to her preferred approach, after repeating my concerns about the risks.  I explained that I would document this discussion in her chart in lieu of written/signed informed consent due to the low-risk nature of the intervention.    Assessment & Plan: Agreed to Abilify add on, at low dose, reluctantly, after extensive verbal informed consent documented above in overview. Social determinants of health have interfered with behavioral health counselor and psychiatry follow up so I'm left managing all of these roles, but I encourage her to re-establish.    Medical Decision Making: 1 or more chronic illnesses with exacerbation,  progression, or side effects of treatment 2 or more stable chronic illnesses [Risk Management] Prescription drug management       Clinical Presentation:   The patient is a 27 y.o. female: Active Ambulatory Problems    Diagnosis Date Noted   Nicotine dependence due to vaping tobacco product 01/31/2018   Asthma 01/31/2018   GAD (generalized anxiety disorder) 01/31/2018   ASCUS of cervix with negative high risk HPV 03/07/2018   Macrocytosis 05/19/2018   Substance induced mood disorder ([place]) 12/09/2018   Alcohol use with alcohol-induced mood disorder ([place])    Alcohol use disorder, severe, in early remission, dependence ([place]) 12/27/2018   Recurrent major depressive disorder ([place]) 12/27/2018   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/2019   Delusional disorder ([place])    Auditory hallucinations 01/24/2021   Visual hallucination 01/24/2021   Chronic abdominal pain 01/24/2021   PTSD (post-traumatic stress disorder) 03/19/2022   Memory loss 04/03/2022   Snoring 04/17/2022   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 04/17/2022   Withdrawal syndrome ([place]) 09/27/2022   Depression, major,  single episode, severe ([place]) 09/28/2022   Financial difficulties 10/04/2022   Resolved Ambulatory Problems    Diagnosis Date Noted   Acute pyelonephritis 05/19/2018   Sepsis ([place]) 05/19/2018   Hypokalemia 05/19/2018   Hypocalcemia 05/19/2018   Hypomagnesemia 05/19/2018   AKI (acute kidney injury) ([place]) 05/19/2018   Electrolyte disturbance 05/19/2018   Thrombocytopenia ([place]) 05/19/2018   Alcohol use 05/19/2018   Alcohol abuse 12/09/2018   Alcohol withdrawal ([place]) 12/15/2018   Seizure ([place]) 12/26/2018   Alcohol withdrawal ([place]) 01/10/2019   Alcohol abuse with alcohol-induced mood disorder ([place]) 01/16/2019   Alcohol intoxication with moderate or severe use disorder ([place]) 01/16/2019   Cocaine use disorder, moderate, in early remission ([place]) 08/13/2019   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   Seizures ([place]) 10/03/2020   Depression 10/03/2020   Alcohol use 10/03/2020   Transient alteration of awareness 01/24/2021   Hypokalemia 02/28/2021   History of seizure due to alcohol withdrawal 02/28/2021   Acute encephalopathy 06/27/2022   Nausea vomiting and diarrhea 06/27/2022   Depression with anxiety 06/27/2022   History of alcohol withdrawal delirium 07/04/2022   Flu-like symptoms 08/03/2022   Acute sinusitis 08/03/2022   Past Medical History:  Diagnosis Date   Anxiety    Pyelonephritis    Seizure-like activity [facility])     Outpatient Medications Prior to Visit  Medication Sig   acamprosate (CAMPRAL) 333 MG tablet Take 2 tablets (666 mg total) by mouth 3 (three) times daily with meals.   buPROPion (WELLBUTRIN XL) 150 MG 24 hr tablet Take 1 tablet (150 mg total) by mouth 2 (two) times daily. Start 150 mg ER PO qam, increase after 3 days to 150 mg ER PO BID.   busPIRone (BUSPAR) 15 MG tablet Take 1 tablet (15 mg total) by mouth 3 (three) times daily. Must be taken consistently.   Dextromethorphan HBr 15 MG TABS Take 3 tablets by mouth daily at 6 (six) AM.    Dextromethorphan-buPROPion ER 45-105 MG TBCR Take 1 tablet by mouth daily.   DULoxetine (CYMBALTA) 30 MG capsule TAKE ONE CAPSULE BY MOUTH TWICE DAILY   gabapentin (NEURONTIN) 300 MG capsule Take 1 capsule (300 mg total) by mouth 3 (three) times daily.   methocarbamol (ROBAXIN) 500 MG tablet Take 1 tablet (500 mg total) by mouth 4 (four) times daily.   Multiple Vitamin (MULTIVITAMIN WITH MINERALS) TABS tablet Take 1 tablet by mouth daily.   naltrexone (DEPADE) 50 MG tablet Take 1 tablet (50 mg total) by mouth daily.   norethindrone-ethinyl estradiol-FE (HAILEY FE 1/20) 1-20 MG-MCG tablet Take 1 tablet by mouth daily.   ondansetron (ZOFRAN) 4 MG tablet Take 1 tablet (4 mg total) by mouth every 8 (eight) hours as needed for nausea or vomiting.   ondansetron (ZOFRAN-ODT) 4 MG disintegrating tablet Dissolve 1 tablet by mouth every 8 hours as needed for nausea or vomiting for nausea from wegovy or other source   propranolol (INDERAL) 20 MG tablet Take 1 tablet (20 mg total) by mouth 3 (three) times daily.   QUEtiapine (SEROQUEL) 100 MG tablet Take 1 tablet (100 mg total) by mouth at bedtime. Replaces 50 mg dosing but please limit to minimum necessary   thiamine (VITAMIN B-1) 100 MG tablet Take 1 tablet (100 mg total) by mouth daily.   topiramate (TOPAMAX) 25 MG tablet Take 1 tablet (25 mg total) by mouth 2 (two) times daily.   traZODone (DESYREL) 100 MG tablet Take 1 tablet (100 mg total) by mouth at bedtime.   [DISCONTINUED] ARIPiprazole (ABILIFY) 10 MG tablet Take 1 tablet (10 mg total) by mouth daily.   No facility-administered medications prior to visit.     Chief Complaint  Patient presents with   2 week follow-up    Was able to get the two new medications for depression and they are starting to help.    HPI  Depression better, but hallucinations returning now that out of Abilify Using Auvelity reconstituted from generic separate drugs, prior authorization still pending Still  depressed Mainly wants continue Abilify and not willing or able to See [name] anymore for it.  Patient reports he refusing to see her.        Clinical Data Analysis:  Physical Exam  BP 98/72 (BP Location: Left Arm, Patient Position: Sitting)   Pulse 85   Temp 98 F (36.7 C) (Temporal)   Ht 5' 5.5" (1.664 m)   Wt 193 lb 6.4 oz (87.7 kg)   SpO2 95%   BMI 31.69 kg/m  Wt Readings from Last 10 Encounters:  10/03/22 193 lb 6.4 oz (87.7 kg)  09/26/22 195 lb 9.6 oz (88.7 kg)  09/20/22 192 lb 3.2 oz (87.2 kg)  08/29/22 193 lb (87.5 kg)  08/17/22 196 lb 3.2 oz (89 kg)  08/03/22 191 lb 6.4 oz (86.8 kg)  07/20/22 195 lb 9.6 oz (88.7 kg)  07/11/22 187 lb 6.4 oz (85 kg)  07/04/22 181 lb 6.4 oz (82.3 kg)  06/26/22 150 lb (68 kg)   Vital signs reviewed.  Nursing notes reviewed. Weight trend reviewed. Abnormalities noted: Body mass index is 31.69 kg/m.  BMI is an unreliable indicator of healthy body composition due to its inability to reflect lean muscle mass.  General Appearance:  Well developed, well nourished female in no acute distress.   Pulmonary:  Normal work of breathing at rest, no respiratory distress apparent. SpO2: 95 %  Musculoskeletal: All extremities are intact.  Neurological:  Awake, alert. No obvious focal neurological deficits or cognitive impairments.  Sensorium seems unclouded. Psychiatric:  Appropriate mood, pleasant demeanor Problem-specific findings:  minimal depression - no tearfulness compared to last visit no response to external stimuli or tangential thinking.   Results Reviewed: (reviewed labs/imaging may be also be found in the assessment / plan section):     No results found for any visits on 10/03/22.  Recent Results (from the past 2160 hour(s))  POCT rapid strep A     Status: Normal   Collection Time: 08/03/22  2:15 PM  Result Value Ref Range   Rapid Strep A Screen Negative Negative  POCT Influenza A/B     Status: Normal   Collection Time:  08/03/22  2:15 PM  Result Value Ref Range   Influenza A, POC Negative Negative   Influenza B, POC Negative Negative  POC COVID-19     Status: Normal   Collection Time: 08/03/22  2:16 PM  Result Value Ref Range   SARS Coronavirus 2 Ag Negative Negative  Urinalysis, Routine w reflex microscopic     Status: Abnormal   Collection Time: 08/03/22  2:32 PM  Result Value Ref Range   Color, Urine ORANGE (A) Yellow;Lt. Yellow;Straw;Dark Yellow;Amber;Green;Red;Brown   APPearance SL CLOUDY (A) Clear;Turbid;Slightly Cloudy;Cloudy   Specific Gravity, Urine 1.015 1.000 - 1.030   pH 8.0 5.0 - 8.0   Total Protein, Urine 30 (A) Negative   Urine Glucose NEGATIVE Negative   Ketones, ur 15 (A) Negative   Bilirubin Urine SMALL (A) Negative   Hgb urine dipstick NEGATIVE Negative   Urobilinogen, UA 2.0 (A) 0.0 - 1.0   Leukocytes,Ua NEGATIVE Negative   Nitrite NEGATIVE Negative   WBC, UA 0-2/hpf 0-2/hpf   RBC / HPF 0-2/hpf 0-2/hpf   Squamous Epithelial / HPF Many(>10/hpf) (A) Rare(0-4/hpf)   Bacteria, UA Few(10-50/hpf) (A) None   Epithelial Casts, UA Presence of (A) None  Lipase     Status: Abnormal   Collection Time: 08/03/22  2:36 PM  Result Value Ref Range   Lipase 6 (L) 7 - 60 U/L    Comment: Verified by repeat analysis. .   Comp Met (CMET)     Status: Abnormal   Collection Time: 08/03/22  2:36 PM  Result Value Ref Range   Glucose, Bld 114 (H) 65 - 99 mg/dL    Comment: .            Fasting reference interval . For someone without known diabetes, a glucose value between 100 and 125 mg/dL is consistent with prediabetes and should be confirmed with a follow-up test. .    BUN 4 (L) 7 - 25 mg/dL   Creat 0.74 0.50 - 0.96 mg/dL   BUN/Creatinine Ratio 5 (L) 6 - 22 (calc)   Sodium 135 135 - 146 mmol/L   Potassium 3.4 (L) 3.5 - 5.3 mmol/L   Chloride 97 (L) 98 - 110 mmol/L  CO2 18 (L) 20 - 32 mmol/L   Calcium 9.1 8.6 - 10.2 mg/dL   Total Protein 7.9 6.1 - 8.1 g/dL   Albumin 4.5 3.6 - 5.1  g/dL   Globulin 3.4 1.9 - 3.7 g/dL (calc)   AG Ratio 1.3 1.0 - 2.5 (calc)   Total Bilirubin 0.7 0.2 - 1.2 mg/dL   Alkaline phosphatase (APISO) 69 31 - 125 U/L   AST 21 10 - 30 U/L   ALT 18 6 - 29 U/L  CBC     Status: Abnormal   Collection Time: 08/03/22  2:36 PM  Result Value Ref Range   WBC 12.4 (H) 3.8 - 10.8 Thousand/uL   RBC 4.09 3.80 - 5.10 Million/uL   Hemoglobin 13.7 11.7 - 15.5 g/dL   HCT 39.2 35.0 - 45.0 %   MCV 95.8 80.0 - 100.0 fL   MCH 33.5 (H) 27.0 - 33.0 pg   MCHC 34.9 32.0 - 36.0 g/dL   RDW 15.6 (H) 11.0 - 15.0 %   Platelets 285 140 - 400 Thousand/uL   MPV 10.8 7.5 - 12.5 fL  Amylase     Status: None   Collection Time: 08/03/22  2:36 PM  Result Value Ref Range   Amylase 66 21 - 101 U/L  Cytology - PAP( [place])     Status: None   Collection Time: 08/29/22 11:37 AM  Result Value Ref Range   High risk HPV Negative    Neisseria Gonorrhea Negative    Chlamydia Negative    Trichomonas Negative    Adequacy      Satisfactory for evaluation; transformation zone component PRESENT.   Diagnosis      - Negative for intraepithelial lesion or malignancy (NILM)   Microorganisms Shift in flora suggestive of bacterial vaginosis    Comment Normal Reference Range HPV - Negative    Comment Normal Reference Range Trichomonas - Negative    Comment Normal Reference [place] Chlamydia - Negative    Comment      Normal Reference Range Neisseria Gonorrhea - Negative  RPR     Status: None   Collection Time: 08/29/22 11:50 AM  Result Value Ref Range   RPR Ser Ql NON-REACTIVE NON-REACTIVE    Comment: . No laboratory evidence of syphilis. If recent exposure is suspected, submit a new sample in 2-4 weeks. Marland Kitchen   HIV Antibody (routine testing w rflx)     Status: None   Collection Time: 08/29/22 11:50 AM  Result Value Ref Range   HIV 1&2 Ab, 4th Generation NON-REACTIVE NON-REACTIVE    Comment: HIV-1 antigen and HIV-1/HIV-2 antibodies were not detected. There is no laboratory  evidence of HIV infection. Marland Kitchen PLEASE NOTE: This information has been disclosed to you from records whose confidentiality may be protected by state law.  If your state requires such protection, then the state law prohibits you from making any further disclosure of the information without the specific written consent of the person to whom it pertains, or as otherwise permitted by law. A general authorization for the release of medical or other information is NOT sufficient for this purpose. . For additional information please refer to http://education.[facility].com/faq/FAQ106 (This link is being provided for informational/ educational purposes only.) . Marland Kitchen The performance of this assay has not been clinically validated in patients less than 32 years old. .   Hepatitis C antibody     Status: None   Collection Time: 08/29/22 11:50 AM  Result Value Ref Range   Hepatitis C  Ab NON-REACTIVE NON-REACTIVE    Comment: . HCV antibody was non-reactive. There is no laboratory  evidence of HCV infection. . In most cases, no further action is required. However, if recent HCV exposure is suspected, a test for HCV RNA (test code [phone]) is suggested. . For additional information please refer to http://education.[facility].com/faq/FAQ22v1 (This link is being provided for informational/ educational purposes only.) .     No image results found.        Signed: Loralee Pacas, MD 10/04/2022 4:07 PM

## 2022-10-04 NOTE — Assessment & Plan Note (Signed)
Gave her extensive information on transportation and food resources,  see AVS

## 2022-10-12 ENCOUNTER — Ambulatory Visit: Payer: 59 | Admitting: Internal Medicine

## 2022-10-22 ENCOUNTER — Encounter: Payer: Self-pay | Admitting: Internal Medicine

## 2022-10-22 ENCOUNTER — Ambulatory Visit: Payer: 59 | Admitting: Internal Medicine

## 2022-10-22 VITALS — BP 98/76 | HR 131 | Temp 97.8°F | Ht 65.5 in | Wt 181.4 lb

## 2022-10-22 DIAGNOSIS — F4001 Agoraphobia with panic disorder: Secondary | ICD-10-CM | POA: Diagnosis not present

## 2022-10-22 DIAGNOSIS — F411 Generalized anxiety disorder: Secondary | ICD-10-CM

## 2022-10-22 DIAGNOSIS — K529 Noninfective gastroenteritis and colitis, unspecified: Secondary | ICD-10-CM | POA: Insufficient documentation

## 2022-10-22 DIAGNOSIS — R112 Nausea with vomiting, unspecified: Secondary | ICD-10-CM

## 2022-10-22 HISTORY — DX: Noninfective gastroenteritis and colitis, unspecified: K52.9

## 2022-10-22 MED ORDER — ONDANSETRON 4 MG PO TBDP: 4.0000 mg | ORAL_TABLET | Freq: Three times a day (TID) | ORAL | 2 refills | Status: DC | PRN

## 2022-10-22 MED ORDER — PROPRANOLOL HCL 20 MG PO TABS: 20.0000 mg | ORAL_TABLET | Freq: Three times a day (TID) | ORAL | 3 refills | Status: DC

## 2022-10-22 NOTE — Progress Notes (Signed)
[name]: [phone]   Routine Medical Office Visit  Patient:  Natasha [place]      Age: 27 y.o.       Sex:  female  Date:   10/22/2022  PCP:    Loralee Pacas, MD   [place] Provider: Loralee Pacas, MD   Assessment and Plan:   Gastroenteritis Assessment & Plan: Because she has no fevers or bodyaches and the symptoms are improving as reported today I think she will be okay with just aggressive fluid rehydration and treating it as an outpatient.  She has no Zofran or Phenergan so I will give that and I advised her to not take Imodium to stop the diarrhea and just let it clear out.  Suspect some sort of viral or toxic food ingestion based on her history of thinking it might be associated with that.  If that is the case it is best if the diarrhea clears out the toxin and we use nausea meds to prevent further vomiting and to hold him as much liquid as possible  Orders: -     CBC -     Comprehensive metabolic panel -     Lactic acid, plasma  Nausea and vomiting, unspecified vomiting type -     Ondansetron; Take 1 tablet (4 mg total) by mouth every 8 (eight) hours as needed for nausea or vomiting.  Dispense: 60 tablet; Refill: 2 -     CBC -     Comprehensive metabolic panel -     Lactic acid, plasma  Generalized anxiety disorder -     Propranolol HCl; Take 1 tablet (20 mg total) by mouth 3 (three) times daily.  Dispense: 270 tablet; Refill: 3  Panic disorder with agoraphobia -     Propranolol HCl; Take 1 tablet (20 mg total) by mouth 3 (three) times daily.  Dispense: 270 tablet; Refill: 3   Out of propranolol and Zofran and having an improving gastrointestinal bug situation. Gave info on managing symptom(s) and staying hydrated and refilled requested medication(s)     Clinical Presentation:   27 y.o. female here today for 2-3 week follow-up, Diarrhea (For the last two or three days.), and Emesis (For the last two or three  days.)  Reviewed:  has a past medical history of Anxiety, Asthma, Depression, Gastroenteritis (10/22/2022), History of alcohol withdrawal delirium (07/04/2022), History of seizure due to alcohol withdrawal (02/28/2021), Memory loss (04/03/2022), Nausea vomiting and diarrhea (06/27/2022), Nicotine dependence due to vaping tobacco product (01/31/2018), 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 (04/17/2022), PTSD (post-traumatic stress disorder) (03/19/2022), Pyelonephritis, Seizure-like activity, Seizures, Sepsis, Snoring (04/17/2022), Thrombocytopenia (05/19/2018), and Withdrawal syndrome (09/27/2022). Active Ambulatory Problems    Diagnosis Date Noted   Nicotine dependence due to vaping tobacco product 01/31/2018   Asthma 01/31/2018   GAD (generalized anxiety disorder) 01/31/2018   ASCUS of cervix with negative high risk HPV 03/07/2018   Macrocytosis 05/19/2018   Substance induced mood disorder 12/09/2018   Alcohol use with alcohol-induced mood disorder    Alcohol use disorder, severe, in early remission, dependence 12/27/2018   Recurrent major depressive disorder 12/27/2018   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/2019   Delusional disorder    Auditory hallucinations 01/24/2021   Visual hallucination 01/24/2021   Chronic abdominal pain 01/24/2021   PTSD (post-traumatic stress disorder) 03/19/2022   Memory loss 04/03/2022   Snoring 04/17/2022   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 04/17/2022   Withdrawal syndrome 09/27/2022   Depression, major, single episode, severe  09/28/2022   Financial difficulties 10/04/2022   Gastroenteritis 10/22/2022   Resolved Ambulatory Problems    Diagnosis Date Noted   Acute pyelonephritis 05/19/2018   Sepsis 05/19/2018   Hypokalemia 05/19/2018   Hypocalcemia 05/19/2018   Hypomagnesemia 05/19/2018   AKI (acute kidney injury) 05/19/2018   Electrolyte disturbance 05/19/2018   Thrombocytopenia 05/19/2018   Alcohol use 05/19/2018   Alcohol abuse 12/09/2018   Alcohol  withdrawal 12/15/2018   Seizure 12/26/2018   Alcohol withdrawal 01/10/2019   Alcohol abuse with alcohol-induced mood disorder 01/16/2019   Alcohol intoxication with moderate or severe use disorder 01/16/2019   Cocaine use disorder, moderate, in early remission 08/13/2019   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   Seizures 10/03/2020   Depression 10/03/2020   Alcohol use 10/03/2020   Transient alteration of awareness 01/24/2021   Hypokalemia 02/28/2021   History of seizure due to alcohol withdrawal 02/28/2021   Acute encephalopathy 06/27/2022   Nausea vomiting and diarrhea 06/27/2022   Depression with anxiety 06/27/2022   History of alcohol withdrawal delirium 07/04/2022   Flu-like symptoms 08/03/2022   Acute sinusitis 08/03/2022   Past Medical History:  Diagnosis Date   Anxiety    Pyelonephritis    Seizure-like activity     Outpatient Medications Prior to Visit  Medication Sig   acamprosate (CAMPRAL) 333 MG tablet Take 2 tablets (666 mg total) by mouth 3 (three) times daily with meals.   ARIPiprazole (ABILIFY) 2 MG tablet Take 1 tablet (2 mg total) by mouth daily.   buPROPion (WELLBUTRIN XL) 150 MG 24 hr tablet Take 1 tablet (150 mg total) by mouth 2 (two) times daily. Start 150 mg ER PO qam, increase after 3 days to 150 mg ER PO BID.   busPIRone (BUSPAR) 15 MG tablet Take 1 tablet (15 mg total) by mouth 3 (three) times daily. Must be taken consistently.   Dextromethorphan HBr 15 MG TABS Take 3 tablets by mouth daily at 6 (six) AM.   Dextromethorphan-buPROPion ER 45-105 MG TBCR Take 1 tablet by mouth daily.   DULoxetine (CYMBALTA) 30 MG capsule TAKE ONE CAPSULE BY MOUTH TWICE DAILY   gabapentin (NEURONTIN) 300 MG capsule Take 1 capsule (300 mg total) by mouth 3 (three) times daily.   methocarbamol (ROBAXIN) 500 MG tablet Take 1 tablet (500 mg total) by mouth 4 (four) times daily.   Multiple Vitamin (MULTIVITAMIN WITH MINERALS) TABS tablet Take 1 tablet by mouth daily.    naltrexone (DEPADE) 50 MG tablet Take 1 tablet (50 mg total) by mouth daily.   norethindrone-ethinyl estradiol-FE (HAILEY FE 1/20) 1-20 MG-MCG tablet Take 1 tablet by mouth daily.   QUEtiapine (SEROQUEL) 100 MG tablet Take 1 tablet (100 mg total) by mouth at bedtime. Replaces 50 mg dosing but please limit to minimum necessary   thiamine (VITAMIN B-1) 100 MG tablet Take 1 tablet (100 mg total) by mouth daily.   topiramate (TOPAMAX) 25 MG tablet Take 1 tablet (25 mg total) by mouth 2 (two) times daily.   traZODone (DESYREL) 100 MG tablet Take 1 tablet (100 mg total) by mouth at bedtime.   [DISCONTINUED] ondansetron (ZOFRAN) 4 MG tablet Take 1 tablet (4 mg total) by mouth every 8 (eight) hours as needed for nausea or vomiting.   [DISCONTINUED] ondansetron (ZOFRAN-ODT) 4 MG disintegrating tablet Dissolve 1 tablet by mouth every 8 hours as needed for nausea or vomiting for nausea from wegovy or other source   [DISCONTINUED] propranolol (INDERAL) 20 MG tablet Take 1 tablet (20 mg  total) by mouth 3 (three) times daily.   No facility-administered medications prior to visit.    HPI  Updated and modified:  Problem  Gastroenteritis   Nausea, vomiting, and diarrhea x 3 days Secondary dehydration-> dizziness, low blood pressure, dry tongue Bad pain bilateral headache(s) started yesterday, likely from dehydration.  No abdomen pain. No fevers Patient denies any other associated symptom(s)               Clinical Data Analysis:   Physical Exam  BP 98/76 (BP Location: Left Arm, Patient Position: Sitting)   Pulse (!) 131   Temp 97.8 F (36.6 C) (Temporal)   Ht 5' 5.5" (1.664 m)   Wt 181 lb 6.4 oz (82.3 kg)   SpO2 98%   BMI 29.73 kg/m  Wt Readings from Last 10 Encounters:  10/22/22 181 lb 6.4 oz (82.3 kg)  10/03/22 193 lb 6.4 oz (87.7 kg)  09/26/22 195 lb 9.6 oz (88.7 kg)  09/20/22 192 lb 3.2 oz (87.2 kg)  08/29/22 193 lb (87.5 kg)  08/17/22 196 lb 3.2 oz (89 kg)  08/03/22 191 lb 6.4 oz  (86.8 kg)  07/20/22 195 lb 9.6 oz (88.7 kg)  07/11/22 187 lb 6.4 oz (85 kg)  07/04/22 181 lb 6.4 oz (82.3 kg)   Vital signs reviewed.  Nursing notes reviewed. Weight trend reviewed. Abnormalities and Problem-Specific physical exam findings:  slightly dry tongue, no abdominal tenderness on self check General Appearance:  No acute distress appreciable.   Well-groomed, slightly ill-appearing female.  Well proportioned with no abnormal fat distribution.  Good muscle tone. Skin: Clear  Pulmonary:  Normal work of breathing at rest, no respiratory distress apparent. SpO2: 98 %  Musculoskeletal: Patient demonstrates smooth and coordinated movements throughout all major joints.All extremities are intact.  Neurological:  Awake, alert, oriented, and engaged.  No obvious focal neurological deficits or cognitive impairments.  Sensorium seems unclouded. Gait is smooth and coordinated.  Speech is clear and coherent with logical content. Psychiatric:  Appropriate mood, pleasant and cooperative demeanor, cheerful and engaged during the exam    Additional Results Reviewed:     No results found for any visits on 10/22/22.  Recent Results (from the past 2160 hour(s))  POCT rapid strep A     Status: Normal   Collection Time: 08/03/22  2:15 PM  Result Value Ref Range   Rapid Strep A Screen Negative Negative  POCT Influenza A/B     Status: Normal   Collection Time: 08/03/22  2:15 PM  Result Value Ref Range   Influenza A, POC Negative Negative   Influenza B, POC Negative Negative  POC COVID-19     Status: Normal   Collection Time: 08/03/22  2:16 PM  Result Value Ref Range   SARS Coronavirus 2 Ag Negative Negative  Urinalysis, Routine w reflex microscopic     Status: Abnormal   Collection Time: 08/03/22  2:32 PM  Result Value Ref Range   Color, Urine ORANGE (A) Yellow;Lt. Yellow;Straw;Dark Yellow;Amber;Green;Red;Brown   APPearance SL CLOUDY (A) Clear;Turbid;Slightly Cloudy;Cloudy   Specific Gravity,  Urine 1.015 1.000 - 1.030   pH 8.0 5.0 - 8.0   Total Protein, Urine 30 (A) Negative   Urine Glucose NEGATIVE Negative   Ketones, ur 15 (A) Negative   Bilirubin Urine SMALL (A) Negative   Hgb urine dipstick NEGATIVE Negative   Urobilinogen, UA 2.0 (A) 0.0 - 1.0   Leukocytes,Ua NEGATIVE Negative   Nitrite NEGATIVE Negative   WBC, UA 0-2/hpf 0-2/hpf  RBC / HPF 0-2/hpf 0-2/hpf   Squamous Epithelial / HPF Many(>10/hpf) (A) Rare(0-4/hpf)   Bacteria, UA Few(10-50/hpf) (A) None   Epithelial Casts, UA Presence of (A) None  Lipase     Status: Abnormal   Collection Time: 08/03/22  2:36 PM  Result Value Ref Range   Lipase 6 (L) 7 - 60 U/L    Comment: Verified by repeat analysis. .   Comp Met (CMET)     Status: Abnormal   Collection Time: 08/03/22  2:36 PM  Result Value Ref Range   Glucose, Bld 114 (H) 65 - 99 mg/dL    Comment: .            Fasting reference interval . For someone without known diabetes, a glucose value between 100 and 125 mg/dL is consistent with prediabetes and should be confirmed with a follow-up test. .    BUN 4 (L) 7 - 25 mg/dL   Creat 0.74 0.50 - 0.96 mg/dL   BUN/Creatinine Ratio 5 (L) 6 - 22 (calc)   Sodium 135 135 - 146 mmol/L   Potassium 3.4 (L) 3.5 - 5.3 mmol/L   Chloride 97 (L) 98 - 110 mmol/L   CO2 18 (L) 20 - 32 mmol/L   Calcium 9.1 8.6 - 10.2 mg/dL   Total Protein 7.9 6.1 - 8.1 g/dL   Albumin 4.5 3.6 - 5.1 g/dL   Globulin 3.4 1.9 - 3.7 g/dL (calc)   AG Ratio 1.3 1.0 - 2.5 (calc)   Total Bilirubin 0.7 0.2 - 1.2 mg/dL   Alkaline phosphatase (APISO) 69 31 - 125 U/L   AST 21 10 - 30 U/L   ALT 18 6 - 29 U/L  CBC     Status: Abnormal   Collection Time: 08/03/22  2:36 PM  Result Value Ref Range   WBC 12.4 (H) 3.8 - 10.8 Thousand/uL   RBC 4.09 3.80 - 5.10 Million/uL   Hemoglobin 13.7 11.7 - 15.5 g/dL   HCT 39.2 35.0 - 45.0 %   MCV 95.8 80.0 - 100.0 fL   MCH 33.5 (H) 27.0 - 33.0 pg   MCHC 34.9 32.0 - 36.0 g/dL   RDW 15.6 (H) 11.0 - 15.0 %    Platelets 285 140 - 400 Thousand/uL   MPV 10.8 7.5 - 12.5 fL  Amylase     Status: None   Collection Time: 08/03/22  2:36 PM  Result Value Ref Range   Amylase 66 21 - 101 U/L  Cytology - PAP( [place])     Status: None   Collection Time: 08/29/22 11:37 AM  Result Value Ref Range   High risk HPV Negative    Neisseria Gonorrhea Negative    Chlamydia Negative    Trichomonas Negative    Adequacy      Satisfactory for evaluation; transformation zone component PRESENT.   Diagnosis      - Negative for intraepithelial lesion or malignancy (NILM)   Microorganisms Shift in flora suggestive of bacterial vaginosis    Comment Normal Reference Range HPV - Negative    Comment Normal Reference Range Trichomonas - Negative    Comment Normal Reference [place] Chlamydia - Negative    Comment      Normal Reference Range Neisseria Gonorrhea - Negative  RPR     Status: None   Collection Time: 08/29/22 11:50 AM  Result Value Ref Range   RPR Ser Ql NON-REACTIVE NON-REACTIVE    Comment: . No laboratory evidence of syphilis. If recent exposure is suspected,  submit a new sample in 2-4 weeks. Marland Kitchen   HIV Antibody (routine testing w rflx)     Status: None   Collection Time: 08/29/22 11:50 AM  Result Value Ref Range   HIV 1&2 Ab, 4th Generation NON-REACTIVE NON-REACTIVE    Comment: HIV-1 antigen and HIV-1/HIV-2 antibodies were not detected. There is no laboratory evidence of HIV infection. Marland Kitchen PLEASE NOTE: This information has been disclosed to you from records whose confidentiality may be protected by state law.  If your state requires such protection, then the state law prohibits you from making any further disclosure of the information without the specific written consent of the person to whom it pertains, or as otherwise permitted by law. A general authorization for the release of medical or other information is NOT sufficient for this purpose. . For additional information please refer  to http://education.[facility].com/faq/FAQ106 (This link is being provided for informational/ educational purposes only.) . Marland Kitchen The performance of this assay has not been clinically validated in patients less than 55 years old. .   Hepatitis C antibody     Status: None   Collection Time: 08/29/22 11:50 AM  Result Value Ref Range   Hepatitis C Ab NON-REACTIVE NON-REACTIVE    Comment: . HCV antibody was non-reactive. There is no laboratory  evidence of HCV infection. . In most cases, no further action is required. However, if recent HCV exposure is suspected, a test for HCV RNA (test code [phone]) is suggested. . For additional information please refer to http://education.[facility].com/faq/FAQ22v1 (This link is being provided for informational/ educational purposes only.) .     No image results found.   No results found.   --------------------------------    Signed: Loralee Pacas, MD 10/22/2022 9:03 PM

## 2022-10-22 NOTE — Assessment & Plan Note (Signed)
Because she has no fevers or bodyaches and the symptoms are improving as reported today I think she will be okay with just aggressive fluid rehydration and treating it as an outpatient.  She has no Zofran or Phenergan so I will give that and I advised her to not take Imodium to stop the diarrhea and just let it clear out.  Suspect some sort of viral or toxic food ingestion based on her history of thinking it might be associated with that.  If that is the case it is best if the diarrhea clears out the toxin and we use nausea meds to prevent further vomiting and to hold him as much liquid as possible

## 2022-11-12 ENCOUNTER — Ambulatory Visit (INDEPENDENT_AMBULATORY_CARE_PROVIDER_SITE_OTHER): Payer: 59 | Admitting: Internal Medicine

## 2022-11-12 ENCOUNTER — Encounter: Payer: Self-pay | Admitting: Internal Medicine

## 2022-11-12 VITALS — BP 102/74 | HR 99 | Temp 98.0°F | Ht 65.5 in | Wt 187.0 lb

## 2022-11-12 DIAGNOSIS — F4310 Post-traumatic stress disorder, unspecified: Secondary | ICD-10-CM

## 2022-11-12 DIAGNOSIS — F1021 Alcohol dependence, in remission: Secondary | ICD-10-CM | POA: Diagnosis not present

## 2022-11-12 DIAGNOSIS — E669 Obesity, unspecified: Secondary | ICD-10-CM | POA: Diagnosis not present

## 2022-11-12 DIAGNOSIS — F5105 Insomnia due to other mental disorder: Secondary | ICD-10-CM | POA: Diagnosis not present

## 2022-11-12 DIAGNOSIS — F99 Mental disorder, not otherwise specified: Secondary | ICD-10-CM

## 2022-11-12 MED ORDER — GABAPENTIN 300 MG PO CAPS
300.0000 mg | ORAL_CAPSULE | Freq: Three times a day (TID) | ORAL | 11 refills | Status: DC
Start: 2022-11-12 — End: 2022-11-26

## 2022-11-12 MED ORDER — TRAZODONE HCL 100 MG PO TABS
100.0000 mg | ORAL_TABLET | Freq: Every day | ORAL | 1 refills | Status: DC
Start: 2022-11-12 — End: 2023-01-21

## 2022-11-12 MED ORDER — TOPIRAMATE 25 MG PO TABS: 25.0000 mg | ORAL_TABLET | Freq: Two times a day (BID) | ORAL | 0 refills | Status: DC

## 2022-11-12 MED ORDER — QUETIAPINE FUMARATE 100 MG PO TABS: 100.0000 mg | ORAL_TABLET | Freq: Every day | ORAL | 0 refills | Status: DC

## 2022-11-12 NOTE — Progress Notes (Signed)
[facility]: [phone]   Routine Medical Office Visit  Patient:  Natasha Pope [place]      Age: 27 y.o.       Sex:  female  Date:   11/12/2022 PCP:    [name], MD   Today's Healthcare Provider: [name], MD   Assessment and Plan:   PTSD (post-traumatic stress disorder) Assessment & Plan: Reordered referring to behavioral health for 5th or 6th time- we've struggled to get her in anywhere Just now got medicaid so situation has changed.  Orders: -     Ambulatory referral to Psychology  Alcohol use disorder, severe, in early remission, dependence Assessment & Plan: Strongly advised patient to resume gabapentin at prior dose, and not consider tapering until after her birthday Arranged post birthday follow up to discuss this issue further.  Orders: -     Ambulatory referral to Psychology -     Gabapentin; Take 1 capsule (300 mg total) by mouth 3 (three) times daily.  Dispense: 90 capsule; Refill: 11  Insomnia due to other mental disorder -     traZODone HCl; Take 1 tablet (100 mg total) by mouth at bedtime.  Dispense: 90 tablet; Refill: 1 -     QUEtiapine Fumarate; Take 1 tablet (100 mg total) by mouth at bedtime. Replaces 50 mg dosing but please limit to minimum necessary  Dispense: 90 tablet; Refill: 0  Obesity (BMI 30-39.9) -     Topiramate; Take 1 tablet (25 mg total) by mouth 2 (two) times daily.  Dispense: 60 tablet; Refill: 0       Clinical Presentation:   27 y.o. female here today for 3 week follow-up and Insomnia (Trouble sleeping for the last three days, trazadone not helping.)  HPI   Updated chart data:  Problem  Ptsd (Post-Traumatic Stress Disorder)   I gave Eye Movement Desensitization and Reprocessing (EMDR) handout last visit and none called her back   Alcohol Use Disorder, Severe, in Early Remission, Dependence   Interim history: [facility] wouldn't fill gabapentin, has had some really bad cravings since then and  she is angry and emotional. Not sure if these symptom(s) are related with  Not following with behavioral health counselor anywhere or psychiatry anywhere anymore. She is up in her head to much. Last few years she has relapsed on her [birth year] bday, her grandmother is going to sit with her this time      Longstanding severe making good progress in recovery as of September and October 2023 then major setback with binge->withdrawal seizure 06/26/22 H/o multiple seizures in 2020 [facility] - have had challenges getting o/p support follow up but does 12 steps with sponsor H/o failing gabapentin and naltrexone - taking gabapentin but not naltrexone  now Good support from grandmother, bf, and 12-step sponsor and ladies in [place]        Reviewed chart data: Active Ambulatory Problems    Diagnosis Date Noted   Nicotine dependence due to vaping tobacco product 01/31/2018   Asthma 01/31/2018   GAD (generalized anxiety disorder) 01/31/2018   ASCUS of cervix with negative high risk HPV 03/07/2018   Macrocytosis 05/19/2018   Substance induced mood disorder 12/09/2018   Alcohol use with alcohol-induced mood disorder    Alcohol use disorder, severe, in early remission, dependence 12/27/2018   Recurrent major depressive disorder 12/27/2018   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/2019   Delusional disorder    Auditory  hallucinations 01/24/2021   Visual hallucination 01/24/2021   Chronic abdominal pain 01/24/2021   PTSD (post-traumatic stress disorder) 03/19/2022   Memory loss 04/03/2022   Snoring 04/17/2022   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 04/17/2022   Withdrawal syndrome 09/27/2022   Depression, major, single episode, severe 09/28/2022   Financial difficulties 10/04/2022   Gastroenteritis 10/22/2022   Resolved Ambulatory Problems    Diagnosis Date Noted   Acute pyelonephritis 05/19/2018   Sepsis 05/19/2018   Hypokalemia 05/19/2018   Hypocalcemia  05/19/2018   Hypomagnesemia 05/19/2018   AKI (acute kidney injury) 05/19/2018   Electrolyte disturbance 05/19/2018   Thrombocytopenia 05/19/2018   Alcohol use 05/19/2018   Alcohol abuse 12/09/2018   Alcohol withdrawal 12/15/2018   Seizure 12/26/2018   Alcohol withdrawal 01/10/2019   Alcohol abuse with alcohol-induced mood disorder 01/16/2019   Alcohol intoxication with moderate or severe use disorder 01/16/2019   Cocaine use disorder, moderate, in early remission 08/13/2019   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   Seizures 10/03/2020   Depression 10/03/2020   Alcohol use 10/03/2020   Transient alteration of awareness 01/24/2021   Hypokalemia 02/28/2021   History of seizure due to alcohol withdrawal 02/28/2021   Acute encephalopathy 06/27/2022   Nausea vomiting and diarrhea 06/27/2022   Depression with anxiety 06/27/2022   History of alcohol withdrawal delirium 07/04/2022   Flu-like symptoms 08/03/2022   Acute sinusitis 08/03/2022   Past Medical History:  Diagnosis Date   Anxiety    Pyelonephritis    Seizure-like activity     Outpatient Medications Prior to Visit  Medication Sig   acamprosate (CAMPRAL) 333 MG tablet Take 2 tablets (666 mg total) by mouth 3 (three) times daily with meals.   ARIPiprazole (ABILIFY) 2 MG tablet Take 1 tablet (2 mg total) by mouth daily.   buPROPion (WELLBUTRIN XL) 150 MG 24 hr tablet Take 1 tablet (150 mg total) by mouth 2 (two) times daily. Start 150 mg ER PO qam, increase after 3 days to 150 mg ER PO BID.   busPIRone (BUSPAR) 15 MG tablet Take 1 tablet (15 mg total) by mouth 3 (three) times daily. Must be taken consistently.   Dextromethorphan HBr 15 MG TABS Take 3 tablets by mouth daily at 6 (six) AM.   Dextromethorphan-buPROPion ER 45-105 MG TBCR Take 1 tablet by mouth daily.   DULoxetine (CYMBALTA) 30 MG capsule TAKE ONE CAPSULE BY MOUTH TWICE DAILY   methocarbamol (ROBAXIN) 500 MG tablet Take 1 tablet (500 mg total) by mouth 4  (four) times daily.   Multiple Vitamin (MULTIVITAMIN WITH MINERALS) TABS tablet Take 1 tablet by mouth daily.   naltrexone (DEPADE) 50 MG tablet Take 1 tablet (50 mg total) by mouth daily.   norethindrone-ethinyl estradiol-FE (HAILEY FE 1/20) 1-20 MG-MCG tablet Take 1 tablet by mouth daily.   ondansetron (ZOFRAN-ODT) 4 MG disintegrating tablet Take 1 tablet (4 mg total) by mouth every 8 (eight) hours as needed for nausea or vomiting.   propranolol (INDERAL) 20 MG tablet Take 1 tablet (20 mg total) by mouth 3 (three) times daily.   thiamine (VITAMIN B-1) 100 MG tablet Take 1 tablet (100 mg total) by mouth daily.   [DISCONTINUED] gabapentin (NEURONTIN) 300 MG capsule Take 1 capsule (300 mg total) by mouth 3 (three) times daily.   [DISCONTINUED] QUEtiapine (SEROQUEL) 100 MG tablet Take 1 tablet (100 mg total) by mouth at bedtime. Replaces 50 mg dosing but please limit to minimum necessary   [DISCONTINUED] topiramate (TOPAMAX) 25 MG tablet Take  1 tablet (25 mg total) by mouth 2 (two) times daily.   [DISCONTINUED] traZODone (DESYREL) 100 MG tablet Take 1 tablet (100 mg total) by mouth at bedtime. (Patient not taking: Reported on 11/12/2022)   No facility-administered medications prior to visit.             Clinical Data Analysis:   Physical Exam  BP 102/74 (BP Location: Left Arm, Patient Position: Sitting)   Pulse 99   Temp 98 F (36.7 C) (Temporal)   Ht 5' 5.5" (1.664 m)   Wt 187 lb (84.8 kg)   SpO2 98%   BMI 30.65 kg/m  Wt Readings from Last 10 Encounters:  11/12/22 187 lb (84.8 kg)  10/22/22 181 lb 6.4 oz (82.3 kg)  10/03/22 193 lb 6.4 oz (87.7 kg)  09/26/22 195 lb 9.6 oz (88.7 kg)  09/20/22 192 lb 3.2 oz (87.2 kg)  08/29/22 193 lb (87.5 kg)  08/17/22 196 lb 3.2 oz (89 kg)  08/03/22 191 lb 6.4 oz (86.8 kg)  07/20/22 195 lb 9.6 oz (88.7 kg)  07/11/22 187 lb 6.4 oz (85 kg)   Vital signs reviewed.  Nursing notes reviewed. Weight trend reviewed. Abnormalities and Problem-Specific  physical exam findings:  looks well, taking good care of hygiene. Seems to have gained significant insight into her medications vs prior visits.   Knew most of them off top of head and ;their purpose. General Appearance:  No acute distress appreciable.   Well-groomed, healthy-appearing female.  Well proportioned with no abnormal fat distribution.  Good muscle tone. Skin: Clear and well-hydrated. Pulmonary:  Normal work of breathing at rest, no respiratory distress apparent. SpO2: 98 %  Musculoskeletal: All extremities are intact.  Neurological:  Awake, alert, oriented, and engaged.  No obvious focal neurological deficits or cognitive impairments.  Sensorium seems unclouded. Gait is smooth and coordinated.  Speech is clear and coherent with logical content. Psychiatric:  Appropriate mood, pleasant and cooperative demeanor, cheerful and engaged during the exam   Additional Results Reviewed:     No results found for any visits on 11/12/22.  Recent Results (from the past 2160 hour(s))  Cytology - PAP( Chloride)     Status: None   Collection Time: 08/29/22 11:37 AM  Result Value Ref Range   High risk HPV Negative    Neisseria Gonorrhea Negative    Chlamydia Negative    Trichomonas Negative    Adequacy      Satisfactory for evaluation; transformation zone component PRESENT.   Diagnosis      - Negative for intraepithelial lesion or malignancy (NILM)   Microorganisms Shift in flora suggestive of bacterial vaginosis    Comment Normal Reference Range HPV - Negative    Comment Normal Reference Range Trichomonas - Negative    Comment Normal Reference [place] Chlamydia - Negative    Comment      Normal Reference Range Neisseria Gonorrhea - Negative  RPR     Status: None   Collection Time: 08/29/22 11:50 AM  Result Value Ref Range   RPR Ser Ql NON-REACTIVE NON-REACTIVE    Comment: . No laboratory evidence of syphilis. If recent exposure is suspected, submit a new sample in 2-4 weeks. Marland Kitchen    HIV Antibody (routine testing w rflx)     Status: None   Collection Time: 08/29/22 11:50 AM  Result Value Ref Range   HIV 1&2 Ab, 4th Generation NON-REACTIVE NON-REACTIVE    Comment: HIV-1 antigen and HIV-1/HIV-2 antibodies were not detected. There is no  laboratory evidence of HIV infection. Marland Kitchen PLEASE NOTE: This information has been disclosed to you from records whose confidentiality may be protected by state law.  If your state requires such protection, then the state law prohibits you from making any further disclosure of the information without the specific written consent of the person to whom it pertains, or as otherwise permitted by law. A general authorization for the release of medical or other information is NOT sufficient for this purpose. . For additional information please refer to http://education.[facility].com/faq/FAQ106 (This link is being provided for informational/ educational purposes only.) . Marland Kitchen The performance of this assay has not been clinically validated in patients less than 33 years old. .   Hepatitis C antibody     Status: None   Collection Time: 08/29/22 11:50 AM  Result Value Ref Range   Hepatitis C Ab NON-REACTIVE NON-REACTIVE    Comment: . HCV antibody was non-reactive. There is no laboratory  evidence of HCV infection. . In most cases, no further action is required. However, if recent HCV exposure is suspected, a test for HCV RNA (test code 09811) is suggested. . For additional information please refer to http://education.[facility].com/faq/FAQ22v1 (This link is being provided for informational/ educational purposes only.) .     No image results found.   No results found.   --------------------------------    Signed: [name], MD 11/12/2022 8:44 PM

## 2022-11-12 NOTE — Assessment & Plan Note (Signed)
Reordered referring to behavioral health for 5th or 6th time- we've struggled to get her in anywhere Just now got medicaid so situation has changed.

## 2022-11-12 NOTE — Assessment & Plan Note (Signed)
Strongly advised patient to resume gabapentin at prior dose, and not consider tapering until after her birthday Arranged post birthday follow up to discuss this issue further.

## 2022-11-23 ENCOUNTER — Ambulatory Visit (INDEPENDENT_AMBULATORY_CARE_PROVIDER_SITE_OTHER): Payer: 59 | Admitting: Family

## 2022-11-23 VITALS — BP 122/98 | HR 112 | Temp 97.5°F | Ht 65.5 in | Wt 189.0 lb

## 2022-11-23 DIAGNOSIS — H938X2 Other specified disorders of left ear: Secondary | ICD-10-CM

## 2022-11-23 NOTE — Progress Notes (Signed)
Patient ID: Natasha Pope, female    DOB: January 07, 1996, 27 y.o.   MRN: [id number]  Chief Complaint  Patient presents with   Ear Pain    sx for 2-3d    HPI:    Left ear pain:   Pt c/o left ear pain for a couple of days, Has tried decongestants which did not help sx. Reports some nasal congestion & sinus pressure, no nasal drainage, no cough or fever.      Assessment & Plan:  1. Congestion of left ear - ear canal clear of cerumen, TM wnl, no bulging, erythema, or effusion. Advised ok to continue generic Sudafed for just a few more days. Recommend using a nasal saline spray tid & prn throughout allergy season. Increase intake of water to 2L per day.   Subjective:    Outpatient Medications Prior to Visit  Medication Sig Dispense Refill   acamprosate (CAMPRAL) 333 MG tablet Take 2 tablets (666 mg total) by mouth 3 (three) times daily with meals. 90 tablet 5   ARIPiprazole (ABILIFY) 2 MG tablet Take 1 tablet (2 mg total) by mouth daily. 90 tablet 3   buPROPion (WELLBUTRIN XL) 150 MG 24 hr tablet Take 1 tablet (150 mg total) by mouth 2 (two) times daily. Start 150 mg ER PO qam, increase after 3 days to 150 mg ER PO BID. 60 tablet 3   busPIRone (BUSPAR) 15 MG tablet Take 1 tablet (15 mg total) by mouth 3 (three) times daily. Must be taken consistently. 270 tablet 3   Dextromethorphan HBr 15 MG TABS Take 3 tablets by mouth daily at 6 (six) AM. 90 tablet 5   Dextromethorphan-buPROPion ER 45-105 MG TBCR Take 1 tablet by mouth daily. 90 tablet 3   DULoxetine (CYMBALTA) 30 MG capsule TAKE ONE CAPSULE BY MOUTH TWICE DAILY 60 capsule 0   gabapentin (NEURONTIN) 300 MG capsule Take 1 capsule (300 mg total) by mouth 3 (three) times daily. 90 capsule 11   methocarbamol (ROBAXIN) 500 MG tablet Take 1 tablet (500 mg total) by mouth 4 (four) times daily. 120 tablet 3   Multiple Vitamin (MULTIVITAMIN WITH MINERALS) TABS tablet Take 1 tablet by mouth daily. 30 tablet 0   naltrexone (DEPADE) 50 MG  tablet Take 1 tablet (50 mg total) by mouth daily. 90 tablet 3   norethindrone-ethinyl estradiol-FE (HAILEY FE 1/20) 1-20 MG-MCG tablet Take 1 tablet by mouth daily. 84 tablet 4   ondansetron (ZOFRAN-ODT) 4 MG disintegrating tablet Take 1 tablet (4 mg total) by mouth every 8 (eight) hours as needed for nausea or vomiting. 60 tablet 2   propranolol (INDERAL) 20 MG tablet Take 1 tablet (20 mg total) by mouth 3 (three) times daily. 270 tablet 3   QUEtiapine (SEROQUEL) 100 MG tablet Take 1 tablet (100 mg total) by mouth at bedtime. Replaces 50 mg dosing but please limit to minimum necessary 90 tablet 0   thiamine (VITAMIN B-1) 100 MG tablet Take 1 tablet (100 mg total) by mouth daily. 30 tablet 0   topiramate (TOPAMAX) 25 MG tablet Take 1 tablet (25 mg total) by mouth 2 (two) times daily. 60 tablet 0   traZODone (DESYREL) 100 MG tablet Take 1 tablet (100 mg total) by mouth at bedtime. 90 tablet 1   No facility-administered medications prior to visit.   Past Medical History:  Diagnosis Date   Anxiety    Asthma    Depression    Gastroenteritis 10/22/2022   Nausea, vomiting, and diarrhea x  3 days Secondary dehydration-> dizziness, low blood pressure, dry tongue Bad pain bilateral headache(s) started yesterday, likely from dehydration.  No abdomen pain. 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 Suspect due to vitamin deficiency  Recommended she take daily mvi and continue to abstain from EtOH   Nausea vomiting and diarrhea 06/27/2022   Just for past week or two.   Nicotine dependence due to vaping tobacco product 01/31/2018   Has been smoking since she was 27 years old.    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   Technique we just overweight by BMI but she puts a lot of the weight on her abdomen so I am documenting this as obesity is at least truncal obesity therefore I am going to try to send in for [facility] and see if insurance will cover as not only would help weight loss but actually will reduce her desire to drink   PTSD (post-traumatic stress disorder) 03/19/2022   Pyelonephritis    Seizure-like activity (HCC)    Seizures (HCC)    Sepsis (HCC)    Snoring 04/17/2022   Thrombocytopenia (HCC) 05/19/2018   Lab Results  Component  Value  Date/Time     PLT  285  08/03/2022 02:36 PM     PLT  133 (L)  06/28/2022 06:11 AM     PLT  186  06/27/2022 05:13 AM     PLT  203  06/26/2022 06:52 PM     PLT  323  03/08/2022 12:07 PM     PLT  247  10/03/2020 04:03 PM         Withdrawal syndrome (HCC) 09/27/2022   Severe depression past 3 days out of proportion to triggers, suspicious for withdrawal from [place] that she recently ran out of.  Also recent change of seroquel considered but we went 50->100 and its helping her sleep and usually helps severe depression     Past Surgical History:  Procedure Laterality Date   dental procedure     Allergies  Allergen Reactions   Lamotrigine Hives      Objective:    Physical Exam Vitals and nursing note reviewed.  Constitutional:      Appearance: Normal appearance.  HENT:     Right Ear: Tympanic membrane and ear canal normal.     Left Ear: Tympanic membrane and ear canal normal.     Nose: Congestion present. No mucosal edema or rhinorrhea.     Right Sinus: No maxillary sinus tenderness or frontal sinus tenderness.     Left Sinus: No  maxillary sinus tenderness or frontal sinus tenderness.     Mouth/Throat:     Mouth: Mucous membranes are moist.     Pharynx: No pharyngeal swelling, oropharyngeal exudate, posterior oropharyngeal erythema or uvula swelling.  Cardiovascular:     Rate and Rhythm: Normal rate and regular rhythm.  Pulmonary:  Effort: Pulmonary effort is normal.     Breath sounds: Normal breath sounds.  Musculoskeletal:        General: Normal range of motion.  Lymphadenopathy:     Head:     Right side of head: No tonsillar, preauricular or posterior auricular adenopathy.     Left side of head: No tonsillar, preauricular or posterior auricular adenopathy.     Cervical: No cervical adenopathy.  Skin:    General: Skin is warm and dry.  Neurological:     Mental Status: She is alert.  Psychiatric:        Mood and Affect: Mood normal.        Behavior: Behavior normal.    BP (!) 122/98   Pulse (!) 112   Temp (!) 97.5 F (36.4 C) (Temporal)   Ht 5' 5.5" (1.664 m)   Wt 189 lb (85.7 kg)   BMI 30.97 kg/m  Wt Readings from Last 3 Encounters:  11/23/22 189 lb (85.7 kg)  11/12/22 187 lb (84.8 kg)  10/22/22 181 lb 6.4 oz (82.3 kg)      Dulce Sellar, NP

## 2022-11-26 ENCOUNTER — Encounter: Payer: Self-pay | Admitting: Internal Medicine

## 2022-11-26 ENCOUNTER — Ambulatory Visit (INDEPENDENT_AMBULATORY_CARE_PROVIDER_SITE_OTHER): Payer: 59 | Admitting: Internal Medicine

## 2022-11-26 VITALS — BP 102/82 | HR 113 | Temp 98.2°F | Ht 65.5 in | Wt 191.0 lb

## 2022-11-26 DIAGNOSIS — Z640 Problems related to unwanted pregnancy: Secondary | ICD-10-CM | POA: Diagnosis not present

## 2022-11-26 DIAGNOSIS — F1021 Alcohol dependence, in remission: Secondary | ICD-10-CM

## 2022-11-26 HISTORY — DX: Problems related to unwanted pregnancy: Z64.0

## 2022-11-26 MED ORDER — GABAPENTIN 100 MG PO CAPS
100.0000 mg | ORAL_CAPSULE | Freq: Three times a day (TID) | ORAL | 3 refills | Status: DC
Start: 2022-11-26 — End: 2022-12-11

## 2022-11-26 NOTE — Assessment & Plan Note (Signed)
We discussed [place] (must be prior to 12 week(s) since July 2023 for elective abortion) regarding this issue it sounds like she has a plan that should work that they are going to get her in in time although there is not certainty about the age of the fetus it sounds like most likely it is going to be around 8-10 and [place] law allows for through 12-week and she is getting the ultrasound in 2 days and then they will go from there I am pretty sure that if it is a rush they will squeeze her in quicker.  Offered to see her back in just a few days to assist with other options if this doesn't work

## 2022-11-26 NOTE — Progress Notes (Signed)
[facility]: [phone]   Routine Medical Office Visit  Patient:  Natasha Pope [place]      Age: 27 y.o.       Sex:  female  Date:   11/26/2022 PCP:    [name], MD   Today's Healthcare Provider: [name], MD       Assessment and Plan:   Alcohol use disorder, severe, in early remission, dependence (HCC) -     Gabapentin; Take 1 capsule (100 mg total) by mouth 3 (three) times daily.  Dispense: 90 capsule; Refill: 3  Unwanted pregnancy Assessment & Plan: We discussed [place] (must be prior to 12 week(s) since July 2023 for elective abortion) regarding this issue it sounds like she has a plan that should work that they are going to get her in in time although there is not certainty about the age of the fetus it sounds like most likely it is going to be around 8-10 and [place] law allows for through 12-week and she is getting the ultrasound in 2 days and then they will go from there I am pretty sure that if it is a rush they will squeeze her in quicker.  Offered to see her back in just a few days to assist with other options if this doesn't work        Treatment plan discussed and reviewed in detail. Explained medication safety and potential side effects. Agreed on patient returning to office if symptoms worsen, persist, or new symptoms develop. Discussed precautions in case of needing to visit the Emergency Department. Answered all patient questions and confirmed understanding and comfort with the plan. Encouraged patient to contact our office if they have any questions or concerns.         Clinical Presentation:   27 y.o. female here today for 2 Week Follow-up  HPI   Updated chart data:  Problem  Unwanted Pregnancy  Alcohol Use Disorder, Severe, in Early Remission, Dependence (Hcc)   Interim history:    she is angry and emotional. Not sure if these symptom(s) are related with  Not following with behavioral health  counselor anywhere or psychiatry anywhere anymore. She is up in her head to much. Last few years she has relapsed on her [birth year] bday, her grandmother is going to sit with her this time      Longstanding severe making good progress in recovery as of September and October 2023 then major setback with binge->withdrawal seizure 06/26/22 H/o multiple seizures in 2020 Has done [facility]/p [facility]hab [facility] was good - have had challenges getting o/p support follow up but does 12 steps with sponsor H/o failing gabapentin and naltrexone - taking gabapentin but not naltrexone  now Good support from grandmother, bf, and 12-step sponsor and ladies in [employer]        Reviewed chart data: Active Ambulatory Problems    Diagnosis Date Noted   Nicotine dependence due to vaping tobacco product 01/31/2018   Asthma 01/31/2018   GAD (generalized anxiety disorder) 01/31/2018   ASCUS of cervix with negative high risk HPV 03/07/2018   Macrocytosis 05/19/2018   Substance induced mood disorder (HCC) 12/09/2018   Alcohol use with alcohol-induced mood disorder (HCC)    Alcohol use disorder, severe, in early remission, dependence (HCC) 12/27/2018   Recurrent major depressive disorder (HCC) 12/27/2018   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/2019   Delusional disorder (HCC)    Auditory hallucinations 01/24/2021   Visual hallucination  01/24/2021   Chronic abdominal pain 01/24/2021   PTSD (post-traumatic stress disorder) 03/19/2022   Memory loss 04/03/2022   Snoring 04/17/2022   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 04/17/2022   Withdrawal syndrome (HCC) 09/27/2022   Depression, major, single episode, severe (HCC) 09/28/2022   Financial difficulties 10/04/2022   Gastroenteritis 10/22/2022   Unwanted pregnancy 11/26/2022   Resolved Ambulatory Problems    Diagnosis Date Noted   Acute pyelonephritis 05/19/2018   Sepsis (HCC) 05/19/2018   Hypokalemia 05/19/2018   Hypocalcemia 05/19/2018   Hypomagnesemia  05/19/2018   AKI (acute kidney injury) (HCC) 05/19/2018   Electrolyte disturbance 05/19/2018   Thrombocytopenia (HCC) 05/19/2018   Alcohol use 05/19/2018   Alcohol abuse 12/09/2018   Alcohol withdrawal (HCC) 12/15/2018   Seizure (HCC) 12/26/2018   Alcohol withdrawal (HCC) 01/10/2019   Alcohol abuse with alcohol-induced mood disorder (HCC) 01/16/2019   Alcohol intoxication with moderate or severe use disorder (HCC) 01/16/2019   Cocaine use disorder, moderate, in early remission (HCC) 08/13/2019   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   Seizures (HCC) 10/03/2020   Depression 10/03/2020   Alcohol use 10/03/2020   Transient alteration of awareness 01/24/2021   Hypokalemia 02/28/2021   History of seizure due to alcohol withdrawal 02/28/2021   Acute encephalopathy 06/27/2022   Nausea vomiting and diarrhea 06/27/2022   Depression with anxiety 06/27/2022   History of alcohol withdrawal delirium 07/04/2022   Flu-like symptoms 08/03/2022   Acute sinusitis 08/03/2022   Past Medical History:  Diagnosis Date   Anxiety    Pyelonephritis    Seizure-like activity (HCC)     Outpatient Medications Prior to Visit  Medication Sig   acamprosate (CAMPRAL) 333 MG tablet Take 2 tablets (666 mg total) by mouth 3 (three) times daily with meals.   ARIPiprazole (ABILIFY) 2 MG tablet Take 1 tablet (2 mg total) by mouth daily.   buPROPion (WELLBUTRIN XL) 150 MG 24 hr tablet Take 1 tablet (150 mg total) by mouth 2 (two) times daily. Start 150 mg ER PO qam, increase after 3 days to 150 mg ER PO BID.   busPIRone (BUSPAR) 15 MG tablet Take 1 tablet (15 mg total) by mouth 3 (three) times daily. Must be taken consistently.   Dextromethorphan HBr 15 MG TABS Take 3 tablets by mouth daily at 6 (six) AM.   Dextromethorphan-buPROPion ER 45-105 MG TBCR Take 1 tablet by mouth daily.   DULoxetine (CYMBALTA) 30 MG capsule TAKE ONE CAPSULE BY MOUTH TWICE DAILY   methocarbamol (ROBAXIN) 500 MG tablet Take  1 tablet (500 mg total) by mouth 4 (four) times daily.   Multiple Vitamin (MULTIVITAMIN WITH MINERALS) TABS tablet Take 1 tablet by mouth daily.   naltrexone (DEPADE) 50 MG tablet Take 1 tablet (50 mg total) by mouth daily.   norethindrone-ethinyl estradiol-FE (HAILEY FE 1/20) 1-20 MG-MCG tablet Take 1 tablet by mouth daily.   ondansetron (ZOFRAN-ODT) 4 MG disintegrating tablet Take 1 tablet (4 mg total) by mouth every 8 (eight) hours as needed for nausea or vomiting.   propranolol (INDERAL) 20 MG tablet Take 1 tablet (20 mg total) by mouth 3 (three) times daily.   QUEtiapine (SEROQUEL) 100 MG tablet Take 1 tablet (100 mg total) by mouth at bedtime. Replaces 50 mg dosing but please limit to minimum necessary   thiamine (VITAMIN B-1) 100 MG tablet Take 1 tablet (100 mg total) by mouth daily.   topiramate (TOPAMAX) 25 MG tablet Take 1 tablet (25 mg total) by mouth 2 (two)  times daily.   traZODone (DESYREL) 100 MG tablet Take 1 tablet (100 mg total) by mouth at bedtime.   [DISCONTINUED] gabapentin (NEURONTIN) 300 MG capsule Take 1 capsule (300 mg total) by mouth 3 (three) times daily.   No facility-administered medications prior to visit.           Clinical Data Analysis:   Physical Exam  BP 102/82 (BP Location: Left Arm, Patient Position: Sitting)   Pulse (!) 113   Temp 98.2 F (36.8 C) (Temporal)   Ht 5' 5.5" (1.664 m)   Wt 191 lb (86.6 kg)   SpO2 100%   BMI 31.30 kg/m  Wt Readings from Last 10 Encounters:  11/26/22 191 lb (86.6 kg)  11/23/22 189 lb (85.7 kg)  11/12/22 187 lb (84.8 kg)  10/22/22 181 lb 6.4 oz (82.3 kg)  10/03/22 193 lb 6.4 oz (87.7 kg)  09/26/22 195 lb 9.6 oz (88.7 kg)  09/20/22 192 lb 3.2 oz (87.2 kg)  08/29/22 193 lb (87.5 kg)  08/17/22 196 lb 3.2 oz (89 kg)  08/03/22 191 lb 6.4 oz (86.8 kg)   Vital signs reviewed.  Nursing notes reviewed. Weight trend reviewed. Abnormalities and Problem-Specific physical exam findings:  distressed, worried about ability to  get legal abortion in time with current [place] law. General Appearance:  No acute distress appreciable.   Well-groomed, healthy-appearing female.  Well proportioned with no abnormal fat distribution.  Good muscle tone. Skin: Clear and well-hydrated. Pulmonary:  Normal work of breathing at rest, no respiratory distress apparent. SpO2: 100 %  Musculoskeletal: All extremities are intact.  Neurological:  Awake, alert, oriented, and engaged.  No obvious focal neurological deficits or cognitive impairments.  Sensorium seems unclouded.   Speech is clear and coherent with logical content. Psychiatric:  Appropriate mood, pleasant and cooperative demeanor, thoughtful and engaged during the exam   Additional Results Reviewed:     No results found for any visits on 11/26/22.  Recent Results (from the past 2160 hour(s))  Cytology - PAP( [place])     Status: None   Collection Time: 08/29/22 11:37 AM  Result Value Ref Range   High risk HPV Negative    Neisseria Gonorrhea Negative    Chlamydia Negative    Trichomonas Negative    Adequacy      Satisfactory for evaluation; transformation zone component PRESENT.   Diagnosis      - Negative for intraepithelial lesion or malignancy (NILM)   Microorganisms Shift in flora suggestive of bacterial vaginosis    Comment Normal Reference Range HPV - Negative    Comment Normal Reference Range Trichomonas - Negative    Comment Normal Reference [place] Chlamydia - Negative    Comment      Normal Reference Range Neisseria Gonorrhea - Negative  RPR     Status: None   Collection Time: 08/29/22 11:50 AM  Result Value Ref Range   RPR Ser Ql NON-REACTIVE NON-REACTIVE    Comment: . No laboratory evidence of syphilis. If recent exposure is suspected, submit a new sample in 2-4 weeks. Marland Kitchen   HIV Antibody (routine testing w rflx)     Status: None   Collection Time: 08/29/22 11:50 AM  Result Value Ref Range   HIV 1&2 Ab, 4th Generation NON-REACTIVE NON-REACTIVE     Comment: HIV-1 antigen and HIV-1/HIV-2 antibodies were not detected. There is no laboratory evidence of HIV infection. Marland Kitchen PLEASE NOTE: This information has been disclosed to you from records whose confidentiality may be protected by  state law.  If your state requires such protection, then the state law prohibits you from making any further disclosure of the information without the specific written consent of the person to whom it pertains, or as otherwise permitted by law. A general authorization for the release of medical or other information is NOT sufficient for this purpose. . For additional information please refer to http://education.[facility].com/faq/FAQ106 (This link is being provided for informational/ educational purposes only.) . Marland Kitchen The performance of this assay has not been clinically validated in patients less than 56 years old. .   Hepatitis C antibody     Status: None   Collection Time: 08/29/22 11:50 AM  Result Value Ref Range   Hepatitis C Ab NON-REACTIVE NON-REACTIVE    Comment: . HCV antibody was non-reactive. There is no laboratory  evidence of HCV infection. . In most cases, no further action is required. However, if recent HCV exposure is suspected, a test for HCV RNA (test code 11914) is suggested. . For additional information please refer to http://education.[facility].com/faq/FAQ22v1 (This link is being provided for informational/ educational purposes only.) .     No image results found.   No results found.   --------------------------------    Signed: [name], MD 11/26/2022 8:16 PM

## 2022-12-06 ENCOUNTER — Other Ambulatory Visit: Payer: Self-pay | Admitting: Internal Medicine

## 2022-12-06 DIAGNOSIS — E669 Obesity, unspecified: Secondary | ICD-10-CM

## 2022-12-11 ENCOUNTER — Ambulatory Visit (INDEPENDENT_AMBULATORY_CARE_PROVIDER_SITE_OTHER): Payer: 59 | Admitting: Internal Medicine

## 2022-12-11 ENCOUNTER — Encounter: Payer: Self-pay | Admitting: Internal Medicine

## 2022-12-11 VITALS — BP 148/102 | HR 75 | Temp 98.2°F | Ht 65.5 in | Wt 194.4 lb

## 2022-12-11 DIAGNOSIS — E669 Obesity, unspecified: Secondary | ICD-10-CM

## 2022-12-11 DIAGNOSIS — Z6830 Body mass index (BMI) 30.0-30.9, adult: Secondary | ICD-10-CM

## 2022-12-11 DIAGNOSIS — F4310 Post-traumatic stress disorder, unspecified: Secondary | ICD-10-CM

## 2022-12-11 DIAGNOSIS — F1021 Alcohol dependence, in remission: Secondary | ICD-10-CM | POA: Diagnosis not present

## 2022-12-11 DIAGNOSIS — F330 Major depressive disorder, recurrent, mild: Secondary | ICD-10-CM | POA: Diagnosis not present

## 2022-12-11 MED ORDER — TOPIRAMATE 25 MG PO TABS: 50.0000 mg | ORAL_TABLET | Freq: Two times a day (BID) | ORAL | 0 refills | Status: DC

## 2022-12-11 NOTE — Progress Notes (Signed)
[facility]: [phone]   Routine Medical Office Visit  Patient:  Natasha Pope [place]      Age: 27 y.o.       Sex:  female  Date:   12/11/2022 PCP:    [name], MD   Today's Healthcare Provider: [name], MD   Assessment and Plan:   Outstanding situation, mood is great, staying sober.  Will take this opportunity to wean Cymbalta and gabapentin from list, and boost topiramate for weight loss benefit. Continue close follow up as I'm now main provider of psychiatry and Primary Care Provider (PCP) management, but she is doing great.  PTSD (post-traumatic stress disorder) Assessment & Plan: Encouraged patient to her to try teletherapy, and re try Eye Movement Desensitization and Reprocessing (EMDR) handout Encouraged patient to to lean on friends family and sponsors. Encouraged patient to exercise Encouraged patient to to go to meetings Encouraged patient to to journal Advised patient to try mindfulness and muscle relaxation Encouraged patient to to try yoga and tai chi Advised patient to eat well, get sleep and stay hydrated.      Obesity (BMI 30-39.9) -     Topiramate; Take 2 tablets (50 mg total) by mouth 2 (two) times daily.  Dispense: 60 tablet; Refill: 0  Alcohol use disorder, severe, in early remission, dependence (HCC)  Mild episode of recurrent major depressive disorder (HCC)  See AVS for detailed follow up instructions.  Set her up with the openai app to help her get more therapy its been a challenge to stay involved in therapy for her. Next Steps in AVS:  increase topiramate to 50 mg twice daily, stop duloxetine, and discontinue gabapentin but ok to take if you're getting cravings.  Try open artificial intelligence for support.  Consider telehealth.  Retry the Eye Movement Desensitization and Reprocessing (EMDR) numbers:  Treatment plan discussed and reviewed in detail. Explained medication safety and potential side effects.  Agreed on patient returning to office if symptoms worsen, persist, or new symptoms develop. Discussed precautions in case of needing to visit the Emergency Department. Answered all patient questions and confirmed understanding and comfort with the plan. Encouraged patient to contact our office if they have any questions or concerns.       Clinical Presentation:    27 y.o. female here today for Follow-up on procedure (Abortion on 5/11.)  HPI  Since last visit had dilation and curettage in [place]-records were not shared but were requested. She feeling well denies any trauma or triggering. Patient reports sober for months and months and months now. All prior emotional depression seems gone at the moment.  The following table summarizes the actions taken during the encounter to manage (by editing,updating, adding,and resolving)  Problem  Ptsd (Post-Traumatic Stress Disorder)   I gave Eye Movement Desensitization and Reprocessing (EMDR) handout last visit and none called her back   Alcohol Use Disorder, Severe, in Early Remission, Dependence (Hcc)   Interim history:  staying sober, hasn't been having cravings at all, off gabapentin last month, mood is great.  Prior history: Longstanding severe making good progress in recovery as of September and October 2023 then major setback with binge->withdrawal seizure 06/26/22 H/o multiple seizures in 2020 Has [facility] but discontinued follow up with them as outpatient - have had challenges getting o/p support follow up but does 12 steps with sponsor H/o failing gabapentin and naltrexone - taking gabapentin but not naltrexone  now Good support from grandmother, bf,  and 12-step sponsor and ladies in [employer]     Recurrent Major Depressive Disorder (Hcc)      12/11/2022   12:17 PM 10/03/2022    2:41 PM 09/26/2022   11:47 AM  Depression screen PHQ 2/9  Decreased Interest 1 1 1   Down, Depressed, Hopeless 1 1 1   PHQ - 2 Score 2 2 2    Altered sleeping 1 1 1   Tired, decreased energy 1 1 1   Change in appetite 1 1 1   Feeling bad or failure about yourself  0 1 0  Trouble concentrating 0 0 0  Moving slowly or fidgety/restless 0 0 0  Suicidal thoughts 0 0 0  PHQ-9 Score 5 6 5   Difficult doing work/chores  Not difficult at all Extremely dIfficult         Reviewed chart data: Active Ambulatory Problems    Diagnosis Date Noted   Nicotine dependence due to vaping tobacco product 01/31/2018   Asthma 01/31/2018   GAD (generalized anxiety disorder) 01/31/2018   ASCUS of cervix with negative high risk HPV 03/07/2018   Macrocytosis 05/19/2018   Substance induced mood disorder (HCC) 12/09/2018   Alcohol use with alcohol-induced mood disorder (HCC)    Alcohol use disorder, severe, in early remission, dependence (HCC) 12/27/2018   Recurrent major depressive disorder (HCC) 12/27/2018   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/2019   Delusional disorder (HCC)    Auditory hallucinations 01/24/2021   Visual hallucination 01/24/2021   Chronic abdominal pain 01/24/2021   PTSD (post-traumatic stress disorder) 03/19/2022   Memory loss 04/03/2022   Snoring 04/17/2022   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 04/17/2022   Withdrawal syndrome (HCC) 09/27/2022   Depression, major, single episode, severe (HCC) 09/28/2022   Financial difficulties 10/04/2022   Gastroenteritis 10/22/2022   Unwanted pregnancy 11/26/2022   Resolved Ambulatory Problems    Diagnosis Date Noted   Acute pyelonephritis 05/19/2018   Sepsis (HCC) 05/19/2018   Hypokalemia 05/19/2018   Hypocalcemia 05/19/2018   Hypomagnesemia 05/19/2018   AKI (acute kidney injury) (HCC) 05/19/2018   Electrolyte disturbance 05/19/2018   Thrombocytopenia (HCC) 05/19/2018   Alcohol use 05/19/2018   Alcohol abuse 12/09/2018   Alcohol withdrawal (HCC) 12/15/2018   Seizure (HCC) 12/26/2018   Alcohol withdrawal (HCC) 01/10/2019   Alcohol abuse with alcohol-induced mood disorder (HCC)  01/16/2019   Alcohol intoxication with moderate or severe use disorder (HCC) 01/16/2019   Cocaine use disorder, moderate, in early remission (HCC) 08/13/2019   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   Seizures (HCC) 10/03/2020   Depression 10/03/2020   Alcohol use 10/03/2020   Transient alteration of awareness 01/24/2021   Hypokalemia 02/28/2021   History of seizure due to alcohol withdrawal 02/28/2021   Acute encephalopathy 06/27/2022   Nausea vomiting and diarrhea 06/27/2022   Depression with anxiety 06/27/2022   History of alcohol withdrawal delirium 07/04/2022   Flu-like symptoms 08/03/2022   Acute sinusitis 08/03/2022   Past Medical History:  Diagnosis Date   Anxiety    Pyelonephritis    Seizure-like activity (HCC)     Outpatient Medications Prior to Visit  Medication Sig   acamprosate (CAMPRAL) 333 MG tablet Take 2 tablets (666 mg total) by mouth 3 (three) times daily with meals.   ARIPiprazole (ABILIFY) 2 MG tablet Take 1 tablet (2 mg total) by mouth daily.   buPROPion (WELLBUTRIN XL) 150 MG 24 hr tablet Take 1 tablet (150 mg total) by mouth 2 (two) times daily. Start 150 mg ER PO qam, increase  after 3 days to 150 mg ER PO BID.   busPIRone (BUSPAR) 15 MG tablet Take 1 tablet (15 mg total) by mouth 3 (three) times daily. Must be taken consistently.   Dextromethorphan HBr 15 MG TABS Take 3 tablets by mouth daily at 6 (six) AM.   methocarbamol (ROBAXIN) 500 MG tablet Take 1 tablet (500 mg total) by mouth 4 (four) times daily.   Multiple Vitamin (MULTIVITAMIN WITH MINERALS) TABS tablet Take 1 tablet by mouth daily.   naltrexone (DEPADE) 50 MG tablet Take 1 tablet (50 mg total) by mouth daily.   norethindrone-ethinyl estradiol-FE (HAILEY FE 1/20) 1-20 MG-MCG tablet Take 1 tablet by mouth daily.   ondansetron (ZOFRAN-ODT) 4 MG disintegrating tablet Take 1 tablet (4 mg total) by mouth every 8 (eight) hours as needed for nausea or vomiting.   propranolol (INDERAL) 20  MG tablet Take 1 tablet (20 mg total) by mouth 3 (three) times daily.   QUEtiapine (SEROQUEL) 100 MG tablet Take 1 tablet (100 mg total) by mouth at bedtime. Replaces 50 mg dosing but please limit to minimum necessary   thiamine (VITAMIN B-1) 100 MG tablet Take 1 tablet (100 mg total) by mouth daily.   traZODone (DESYREL) 100 MG tablet Take 1 tablet (100 mg total) by mouth at bedtime.   [DISCONTINUED] Dextromethorphan-buPROPion ER 45-105 MG TBCR Take 1 tablet by mouth daily.   [DISCONTINUED] DULoxetine (CYMBALTA) 30 MG capsule TAKE ONE CAPSULE BY MOUTH TWICE DAILY   [DISCONTINUED] gabapentin (NEURONTIN) 100 MG capsule Take 1 capsule (100 mg total) by mouth 3 (three) times daily.   [DISCONTINUED] topiramate (TOPAMAX) 25 MG tablet TAKE ONE TABLET BY MOUTH TWICE DAILY   No facility-administered medications prior to visit.        Clinical Data Analysis:   Physical Exam  BP (!) 148/102 (BP Location: Left Arm, Patient Position: Sitting)   Pulse 75   Temp 98.2 F (36.8 C) (Temporal)   Ht 5' 5.5" (1.664 m)   Wt 194 lb 6.4 oz (88.2 kg)   SpO2 95%   BMI 31.86 kg/m  Wt Readings from Last 10 Encounters:  12/11/22 194 lb 6.4 oz (88.2 kg)  11/26/22 191 lb (86.6 kg)  11/23/22 189 lb (85.7 kg)  11/12/22 187 lb (84.8 kg)  10/22/22 181 lb 6.4 oz (82.3 kg)  10/03/22 193 lb 6.4 oz (87.7 kg)  09/26/22 195 lb 9.6 oz (88.7 kg)  09/20/22 192 lb 3.2 oz (87.2 kg)  08/29/22 193 lb (87.5 kg)  08/17/22 196 lb 3.2 oz (89 kg)   Vital signs reviewed.  Nursing notes reviewed. Weight trend reviewed. Abnormalities and Problem-Specific physical exam findings:  seems pleasant and in good spirits, no abdomen pain. Grandmother with her and seems to support that current emotional state is good. General Appearance:  No acute distress appreciable.   Well-groomed, healthy-appearing female.  Well proportioned with no abnormal fat distribution.  Good muscle tone. Skin: Clear and well-hydrated. Pulmonary:  Normal work of  breathing at rest, no respiratory distress apparent. SpO2: 95 %  Musculoskeletal: All extremities are intact.  Neurological:  Awake, alert, oriented, and engaged.  No obvious focal neurological deficits or cognitive impairments.  Sensorium seems unclouded.   Speech is clear and coherent with logical content. Psychiatric:  Appropriate mood, pleasant and cooperative demeanor, thoughtful and engaged during the exam  Results Reviewed:    No results found for any visits on 12/11/22.  No results found for this or any previous visit (from the past 2160 hour(s)).  No image results found.   No results found.     Signed: [name], MD 12/11/2022 7:55 PM

## 2022-12-11 NOTE — Assessment & Plan Note (Signed)
Encouraged patient to her to try teletherapy, and re try Eye Movement Desensitization and Reprocessing (EMDR) handout Encouraged patient to to lean on friends family and sponsors. Encouraged patient to exercise Encouraged patient to to go to meetings Encouraged patient to to journal Advised patient to try mindfulness and muscle relaxation Encouraged patient to to try yoga and tai chi Advised patient to eat well, get sleep and stay hydrated.

## 2022-12-11 NOTE — Assessment & Plan Note (Signed)
Reduce Cymbalta if tolerated.

## 2022-12-11 NOTE — Patient Instructions (Addendum)
It was a pleasure seeing you today! Your health and satisfaction are our top priorities.   Glenetta Hew, MD  Next Steps:  increase topiramate to 50 mg twice daily, stop duloxetine , and discontinue gabapentin but ok to take if you're getting cravings.  Try open artificial intelligence for support.  Consider telehealth.  Retry the [employer] [facility] [employer] (EMDR) numbers below: [facility] Address: [address] [place], [place], [address] Phone: [phone] Services: Individual Counseling, Family Therapy, Couples Counseling, Trauma Therapy, Stress Management, Depression Treatment, Anxiety Treatment, and Grief Counseling   Christophe Louis, [facility] Address: [address] [place], [place], [place], [address] Phone: [phone] Specializes in EMDR, PTSD, anxiety, cognitive behavioral therapy, and other disorders   [facility] Address: [address] [place], [place], [place], [address] Phone: [phone] Offers professional counseling specializing in Complex Trauma and Dissociative Disorders, mood disorders, personality disorders, and EMDR 3.  [facility] Address: [address] [place], [place], [address] Phone: [phone] Specializes in trauma, addiction, and perinatal therapy services 4.  [facility] [address] [place], [place] [address] [phone]   [x]  Early Intervention: Schedule sooner appointment, call our on-call services, or go to emergency room if there is Increase in pain or discomfort New or worsening symptoms Sudden or severe changes in your health [x]  Flexible Follow-Up: We recommend a Return in about 1 month (around 01/11/2023) for review problems and medications, weight loss med management. for optimal routine care. This allows for progress monitoring and treatment adjustments. [x]  Preventive Care: Schedule your annual preventive care visit! It's  typically covered by insurance and helps identify potential health issues early. [x]  Lab & X-ray Appointments: Incomplete tests scheduled today, or call to schedule. X-rays: [facility] (M-F, 8:30am-noon or 1pm-5pm). [x]  Medical Information Release: Sign a release form at front desk to obtain relevant medical information we don't have.  Making the Most of Our Focused (20 minute) Appointments:  [x]   Clearly state your top concerns at the beginning of the visit to focus our discussion [x]   If you anticipate you will need more time, please inform the front desk during scheduling - we can book multiple appointments in the same week. [x]   If you have transportation problems- use our convenient video appointments or ask about transportation support. [x]   We can get down to business faster if you use MyChart to update information before the visit and submit non-urgent questions before your visit. Thank you for taking the time to provide details through MyChart.  Let our nurse know and she can import this information into your encounter documents.  Arrival and Wait Times: [x]   Arriving on time ensures that everyone receives prompt attention. [x]   Early morning (8a) and afternoon (1p) appointments tend to have shortest wait times. [x]   Unfortunately, we cannot delay appointments for late arrivals or hold slots during phone calls.  Getting Answers and Following Up  [x]   Simple Questions & Concerns: For quick questions or basic follow-up after your visit, reach [place] at [phone] or MyChart messaging. [x]   Complex Concerns: If your concern is more complex, scheduling an appointment might be best. Discuss this with the staff to find the most suitable option. [x]   Lab & Imaging Results: We'll contact you directly if results are abnormal or you don't use MyChart. Most normal results will be on MyChart within 2-3 business days, with a  review message from Dr. Jon Billings. Haven't heard back in 2  weeks? Need results sooner? Contact us at [phone]. [x]   Referrals: Our referral coordinator will manage specialist referrals. The specialist's office should contact you within 2 weeks to schedule an appointment. Call us if you haven't heard from them after 2 weeks.  Staying Connected  [x]   MyChart: Activate your MyChart for the fastest way to access results and message [place]. See the last page of this paperwork for instructions on how to activate.  Bring to Your Next Appointment  [x]   Medications: Please bring all your medication bottles to your next appointment to ensure we have an accurate record of your prescriptions. [x]   Health Diaries: If you're monitoring any health conditions at home, keeping a diary of your readings can be very helpful for discussions at your next appointment.  Billing  [x]   X-ray & Lab Orders: These are billed by separate companies. Contact the invoicing company directly for questions or concerns. [x]   Visit Charges: Discuss any billing inquiries with our administrative services team.  Your Satisfaction Matters  [x]   Share Your Experience: We strive for your satisfaction! If you have any complaints, or preferably compliments, please let Dr. Jon Billings know directly or contact our Practice Administrators, Edwena Felty or [employer], by asking at the front desk.   Reviewing Your Records  [x]   Review this early draft of your clinical encounter notes below and the final encounter summary tomorrow on MyChart after its been completed.   Obesity (BMI 30-39.9) -     Topiramate; Take 2 tablets (50 mg total) by mouth 2 (two) times daily.  Dispense: 60 tablet; Refill: 0

## 2022-12-24 ENCOUNTER — Telehealth: Payer: Self-pay | Admitting: Internal Medicine

## 2022-12-24 NOTE — Telephone Encounter (Signed)
Patient's Grandmother states Pharmacy will not fill 15 MG RX for  Dextromethorphan HBr 15 MG TABS  Stating it is available over the counter-insurance will not pay for over the counter medications (over $95.00 self pay).  States they will cover   Dextromethorphan-buPROPion ER 45-105 MG Tbcr (which was prescribed previously but now discontinued)  Requests RX for  Dextromethorphan-buPROPion ER 45-105 MG  be sent to:   [facility] [address], [place] - [address] Phone: [phone]  Fax: [phone]

## 2022-12-24 NOTE — Telephone Encounter (Signed)
Pt states she is unable to get her refills. States she spoke with pharmacy but could not give me an answer as to why they won't refill. She is asking to have provider call.

## 2022-12-24 NOTE — Telephone Encounter (Signed)
Patient called back and stated [facility] still doesn't see rx order for dextromethorphan. Informed patient that medication was last sent to [facility] [address]. Recommended pt call [facility] and check on med refill. Pt verbalized understanding.

## 2023-01-10 ENCOUNTER — Ambulatory Visit: Payer: 59 | Admitting: Internal Medicine

## 2023-01-10 NOTE — Telephone Encounter (Signed)
Please see message below

## 2023-01-11 ENCOUNTER — Other Ambulatory Visit: Payer: Self-pay | Admitting: Internal Medicine

## 2023-01-11 DIAGNOSIS — F322 Major depressive disorder, single episode, severe without psychotic features: Secondary | ICD-10-CM

## 2023-01-11 MED ORDER — AUVELITY 45-105 MG PO TBCR
1.0000 | EXTENDED_RELEASE_TABLET | Freq: Two times a day (BID) | ORAL | 3 refills | Status: DC
Start: 2023-01-11 — End: 2023-01-18

## 2023-01-17 ENCOUNTER — Telehealth: Payer: Self-pay

## 2023-01-17 NOTE — Telephone Encounter (Signed)
Pharmacy Patient Advocate Encounter   Received notification from CoverMyMeds that prior authorization for [place] 45-105MG  ER tablets is required/requested.   Insurance verification completed.   The patient is insured through [employer] .  PA submitted to [place] via CoverMyMeds Key/confirmation #/EOC [id number] Status is pending

## 2023-01-18 ENCOUNTER — Other Ambulatory Visit: Payer: Self-pay

## 2023-01-18 ENCOUNTER — Ambulatory Visit: Payer: Medicaid Other | Admitting: Family Medicine

## 2023-01-18 ENCOUNTER — Encounter: Payer: Self-pay | Admitting: Family Medicine

## 2023-01-18 VITALS — BP 130/90 | HR 115 | Temp 97.6°F | Resp 16 | Ht 65.5 in | Wt 188.4 lb

## 2023-01-18 DIAGNOSIS — F322 Major depressive disorder, single episode, severe without psychotic features: Secondary | ICD-10-CM

## 2023-01-18 DIAGNOSIS — F330 Major depressive disorder, recurrent, mild: Secondary | ICD-10-CM

## 2023-01-18 DIAGNOSIS — F411 Generalized anxiety disorder: Secondary | ICD-10-CM

## 2023-01-18 DIAGNOSIS — R12 Heartburn: Secondary | ICD-10-CM | POA: Diagnosis not present

## 2023-01-18 MED ORDER — OMEPRAZOLE 40 MG PO CPDR: 40.0000 mg | DELAYED_RELEASE_CAPSULE | Freq: Every day | ORAL | 0 refills | Status: DC

## 2023-01-18 MED ORDER — ESCITALOPRAM OXALATE 10 MG PO TABS
10.0000 mg | ORAL_TABLET | Freq: Every day | ORAL | 0 refills | Status: DC
Start: 2023-01-18 — End: 2023-01-21

## 2023-01-18 MED ORDER — AUVELITY 45-105 MG PO TBCR
1.0000 | EXTENDED_RELEASE_TABLET | Freq: Two times a day (BID) | ORAL | 3 refills | Status: DC
Start: 2023-01-18 — End: 2023-06-06

## 2023-01-18 NOTE — Patient Instructions (Addendum)
Add lexapro  Behavioral health hospital if feeling suicidal, not doing well.

## 2023-01-18 NOTE — Progress Notes (Signed)
Subjective:     Patient ID: Natasha Pope, female    DOB: June 12, 1996, 27 y.o.   MRN: [id number]  Chief Complaint  Patient presents with   Depression    Having really bad post partum depression, doesn't think medication is working    HPI Being followed by Dr. Jon Billings for depression and bipolar.  Can't stop crying, can't sleep.  Had 12/01/22 abortion at 6 months of gestation.  Taking meds.  Not seeing psych.  No SI.  Feeling depressed.  Keeps thinking about baby. No regrets but thinking about it.  Got pregnant on ocp so didn't know was pregnant.  Bipolar was very stable until the abortion.  Past few wks, hard and past few days has been worst.   No ETOH for few months.   Not sleeping Some diarrhea past few days.  Vomited few days ago.   Eating some but stomach upset.-heartburn a lot of heartburn  There are no preventive care reminders to display for this patient.  Past Medical History:  Diagnosis Date   Anxiety    Asthma    Depression    Gastroenteritis 10/22/2022   Nausea, vomiting, and diarrhea x 3 days Secondary dehydration-> dizziness, low blood pressure, dry tongue Bad pain bilateral headache(s) started yesterday, likely from dehydration.  No abdomen pain. 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 Suspect due to vitamin deficiency Recommended she take daily mvi and continue to abstain from EtOH    Nausea vomiting and diarrhea 06/27/2022   Just for past week or two.   Nicotine dependence due to vaping tobacco product 01/31/2018   Has been smoking since she was 27 years old.    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   Technique we just overweight by BMI but she puts a lot of the weight on her abdomen so I am documenting this as obesity is at least truncal obesity therefore I am going to try to send in for [facility] and see if insurance will cover as not only would help weight loss but actually will reduce her desire to drink   PTSD (post-traumatic stress disorder) 03/19/2022   Pyelonephritis    Seizure-like activity (HCC)    Seizures (HCC)    Sepsis (HCC)    Snoring 04/17/2022   Thrombocytopenia (HCC) 05/19/2018   Lab Results  Component  Value  Date/Time     PLT  285  08/03/2022 02:36 PM     PLT  133 (L)  06/28/2022 06:11 AM     PLT  186  06/27/2022 05:13 AM     PLT  203  06/26/2022 06:52 PM     PLT  323  03/08/2022 12:07 PM     PLT  247  10/03/2020 04:03 PM         Withdrawal syndrome (HCC) 09/27/2022   Severe depression past 3 days  out of proportion to triggers, suspicious for withdrawal from [place] that she recently ran out of.  Also recent change of seroquel considered but we went 50->100 and its helping her sleep and usually helps severe depression      Past Surgical History:  Procedure Laterality Date   dental procedure       Current Outpatient Medications:    acamprosate (CAMPRAL) 333 MG tablet, Take 2 tablets (666 mg total) by mouth 3 (three) times daily with meals., Disp: 90 tablet, Rfl: 5   ARIPiprazole (ABILIFY) 2 MG tablet, Take 1 tablet (2 mg total) by mouth daily., Disp: 90 tablet, Rfl: 3   busPIRone (BUSPAR) 15 MG tablet, Take 1 tablet (15 mg total) by mouth 3 (three) times daily. Must be taken consistently., Disp: 270 tablet, Rfl: 3   escitalopram (LEXAPRO) 10 MG tablet, Take 1 tablet (10 mg total) by mouth daily., Disp: 30 tablet,  Rfl: 0   methocarbamol (ROBAXIN) 500 MG tablet, Take 1 tablet (500 mg total) by mouth 4 (four) times daily., Disp: 120 tablet, Rfl: 3   Multiple Vitamin (MULTIVITAMIN WITH MINERALS) TABS tablet, Take 1 tablet by mouth daily., Disp: 30 tablet, Rfl: 0   naltrexone (DEPADE) 50 MG tablet, Take 1 tablet (50 mg total) by mouth daily., Disp: 90 tablet, Rfl: 3   norethindrone-ethinyl estradiol-FE (HAILEY FE 1/20) 1-20 MG-MCG tablet, Take 1 tablet by mouth daily., Disp: 84 tablet, Rfl: 4   omeprazole (PRILOSEC) 40 MG capsule, Take 1 capsule (40 mg total) by mouth daily., Disp: 30 capsule, Rfl: 0   ondansetron (ZOFRAN-ODT) 4 MG disintegrating tablet, Take 1 tablet (4 mg total) by mouth every 8 (eight) hours as needed for nausea or vomiting., Disp: 60 tablet, Rfl: 2   propranolol (INDERAL) 20 MG tablet, Take 1 tablet (20 mg total) by mouth 3 (three) times daily., Disp: 270 tablet, Rfl: 3   QUEtiapine (SEROQUEL) 100 MG tablet, Take 1 tablet (100 mg total) by mouth at bedtime. Replaces 50 mg dosing but please limit to minimum necessary, Disp: 90 tablet, Rfl: 0   thiamine (VITAMIN B-1) 100 MG tablet, Take 1 tablet (100 mg total) by mouth daily., Disp: 30 tablet, Rfl: 0   topiramate (TOPAMAX) 25 MG tablet, Take 2 tablets (50 mg total) by mouth 2 (two) times daily., Disp: 60 tablet, Rfl: 0   traZODone (DESYREL) 100 MG tablet, Take 1 tablet (100 mg total) by mouth at bedtime., Disp: 90 tablet, Rfl: 1  Allergies  Allergen Reactions   Lamotrigine Hives   ROS neg/noncontributory except as noted HPI/below      Objective:     BP (!) 130/90 (BP Location: Left Arm, Patient Position: Sitting, Cuff Size: Normal)   Pulse (!) 115   Temp 97.6 F (36.4 C) (Temporal)   Resp 16   Ht 5' 5.5" (1.664 m)   Wt 188 lb 6 oz (85.4 kg)   SpO2 99%   BMI 30.87 kg/m  Wt Readings from Last 3 Encounters:  01/18/23 188 lb 6 oz (85.4 kg)  12/11/22 194 lb 6.4 oz (88.2 kg)  11/26/22 191 lb (86.6 kg)    Physical Exam   Gen:  WDWN NAD HEENT: NCAT, conjunctiva not injected, sclera nonicteric NECK:  supple, no thyromegaly, no nodes, no carotid bruits CARDIAC: RRR, S1S2+, no murmur. DP 2+B LUNGS: CTAB. No wheezes ABDOMEN:  BS+, soft, NTND, No HSM, no masses EXT:  no edema MSK: no gross abnormalities.  NEURO: A&O x3.  CN II-XII intact.  PSYCH: normal  mood. Good eye contact     Assessment & Plan:  Depression, major, single episode, severe (HCC)  Heartburn  Other orders -     Escitalopram Oxalate; Take 1 tablet (10 mg total) by mouth daily.  Dispense: 30 tablet; Refill: 0 -     Omeprazole; Take 1 capsule (40 mg total) by mouth daily.  Dispense: 30 capsule; Refill: 0   Depression-flaring.  May be some post partum(but on ocp all the time).  Advised to go to behavioral health hospital if suicidal, worse, etc.  Will add lexapro 10mg  daily.  Has f/u appt on 7/1.  Pt is on dextromethorphan-bupropion-I accidentally deleted.  Heartburn-advised to avoid spicey foods. Eat small meals.  Omeprazole 40mg  daily  Return for has appt 7/1.  Angelena Sole, MD

## 2023-01-18 NOTE — Telephone Encounter (Signed)
Informed patient and reordered prescription yesterday.

## 2023-01-18 NOTE — Telephone Encounter (Signed)
Pharmacy Patient Advocate Encounter  Received notification from [facility] that Prior Authorization for [facility] ER 45-105MG  has been APPROVED from 01/17/2023 to 01/17/2024.Marland Kitchen  PA #/Case ID/Reference #: [id number]

## 2023-01-21 ENCOUNTER — Encounter: Payer: Self-pay | Admitting: Internal Medicine

## 2023-01-21 ENCOUNTER — Ambulatory Visit: Payer: Medicaid Other | Admitting: Internal Medicine

## 2023-01-21 VITALS — BP 114/88 | HR 98 | Temp 97.9°F | Ht 65.5 in | Wt 195.8 lb

## 2023-01-21 DIAGNOSIS — Z309 Encounter for contraceptive management, unspecified: Secondary | ICD-10-CM | POA: Insufficient documentation

## 2023-01-21 DIAGNOSIS — F325 Major depressive disorder, single episode, in full remission: Secondary | ICD-10-CM

## 2023-01-21 DIAGNOSIS — F331 Major depressive disorder, recurrent, moderate: Secondary | ICD-10-CM | POA: Diagnosis not present

## 2023-01-21 DIAGNOSIS — F99 Mental disorder, not otherwise specified: Secondary | ICD-10-CM

## 2023-01-21 DIAGNOSIS — K219 Gastro-esophageal reflux disease without esophagitis: Secondary | ICD-10-CM

## 2023-01-21 DIAGNOSIS — Z3041 Encounter for surveillance of contraceptive pills: Secondary | ICD-10-CM

## 2023-01-21 DIAGNOSIS — F5105 Insomnia due to other mental disorder: Secondary | ICD-10-CM

## 2023-01-21 DIAGNOSIS — Z3009 Encounter for other general counseling and advice on contraception: Secondary | ICD-10-CM | POA: Diagnosis not present

## 2023-01-21 MED ORDER — NORETHIN ACE-ETH ESTRAD-FE 1-20 MG-MCG PO TABS: 1.0000 | ORAL_TABLET | Freq: Every day | ORAL | 4 refills | Status: DC

## 2023-01-21 MED ORDER — QUETIAPINE FUMARATE 100 MG PO TABS
100.0000 mg | ORAL_TABLET | Freq: Two times a day (BID) | ORAL | 0 refills | Status: DC
Start: 2023-01-21 — End: 2023-06-06

## 2023-01-21 MED ORDER — OMEPRAZOLE 40 MG PO CPDR: 40.0000 mg | DELAYED_RELEASE_CAPSULE | Freq: Every day | ORAL | 0 refills | Status: DC

## 2023-01-21 MED ORDER — ESCITALOPRAM OXALATE 10 MG PO TABS: 10.0000 mg | ORAL_TABLET | Freq: Every day | ORAL | 0 refills | Status: DC

## 2023-01-21 NOTE — Patient Instructions (Addendum)
It was a pleasure seeing you today! Your health and satisfaction are our top priorities.  Glenetta Hew, MD  Your Providers PCP: [name], MD,  [phone]) Referring Provider: [name], MD,  [phone]) Care Team Provider: Magdalene Patricia, MD   Stop duloxetine/trazodone Ok to start [facility] but monitor for serotonin syndrome This is why we are shifting towards seroquel for depression management, to reduce need for serotonin  VISIT SUMMARY:  During our appointment, we discussed your ongoing struggle with depression and anxiety, as well as your concerns about your current medication regimen and birth control. We also talked about your experiences with heartburn and your interest in the Elevate supplement.  YOUR PLAN:  -DEPRESSION: We are adjusting your medication to better manage your depression. This includes discontinuing duloxetine and trazodone, increasing Seroquel, and continuing Lexapro and buspirone. We will also consider discontinuing Abilify and Topiramate. We will monitor these changes closely.  -ANXIETY: We will continue your current dosage of buspirone, which seems to be helping manage your anxiety.  -BIRTH CONTROL: We discussed the potential impact of Topiramate on the effectiveness of your oral contraception. You will decide whether to continue taking Topiramate.  -HEARTBURN: We will continue your current treatment for heartburn with omeprazole, but plan to gradually reduce the dosage due to potential dependency and vitamin deficiencies.  -SUPPLEMENT USE: The Elevate supplement you asked about is safe to use, but it may increase anxiety due to its caffeine content. You will decide whether to continue using it.  INSTRUCTIONS:  Please return for a follow-up appointment in 1 week so we can assess your response to the changes in your medication. In the meantime, please monitor your mood, anxiety levels, and any side effects from your medication. If  you have any concerns or questions, don't hesitate to contact the office.  NEXT STEPS: [x]  Early Intervention: Schedule sooner appointment, call our on-call services, or go to emergency room if there is any significant Increase in pain or discomfort New or worsening symptoms Sudden or severe changes in your health [x]  Flexible Follow-Up: We recommend a No follow-ups on file. for optimal routine care. This allows for progress monitoring and treatment adjustments. [x]  Preventive Care: Schedule your annual preventive care visit! It's typically covered by insurance and helps identify potential health issues early. [x]  Lab & X-ray Appointments: Incomplete tests scheduled today, or call to schedule. X-rays: [facility] (M-F, 8:30am-noon or 1pm-5pm). [x]  Medical Information Release: Sign a release form at front desk to obtain relevant medical information we don't have.  MAKING THE MOST OF OUR FOCUSED 20 MINUTE APPOINTMENTS: [x]   Clearly state your top concerns at the beginning of the visit to focus our discussion [x]   If you anticipate you will need more time, please inform the front desk during scheduling - we can book multiple appointments in the same week. [x]   If you have transportation problems- use our convenient video appointments or ask about transportation support. [x]   We can get down to business faster if you use MyChart to update information before the visit and submit non-urgent questions before your visit. Thank you for taking the time to provide details through MyChart.  Let our nurse know and she can import this information into your encounter documents.  Arrival and Wait Times: [x]   Arriving on time ensures that everyone receives prompt attention. [x]   Early morning (8a) and afternoon (1p) appointments tend to have shortest wait times. [x]   Unfortunately, we cannot delay appointments for late  arrivals or hold slots during phone calls.  Getting Answers and Following  Up [x]   Simple Questions & Concerns: For quick questions or basic follow-up after your visit, reach [place] at [phone] or MyChart messaging. [x]   Complex Concerns: If your concern is more complex, scheduling an appointment might be best. Discuss this with the staff to find the most suitable option. [x]   Lab & Imaging Results: We'll contact you directly if results are abnormal or you don't use MyChart. Most normal results will be on MyChart within 2-3 business days, with a review message from Dr. Jon Billings. Haven't heard back in 2 weeks? Need results sooner? Contact us at [phone]. [x]   Referrals: Our referral coordinator will manage specialist referrals. The specialist's office should contact you within 2 weeks to schedule an appointment. Call us if you haven't heard from them after 2 weeks.  Staying Connected [x]   MyChart: Activate your MyChart for the fastest way to access results and message [place]. See the last page of this paperwork for instructions on how to activate.  Bring to Your Next Appointment [x]   Medications: Please bring all your medication bottles to your next appointment to ensure we have an accurate record of your prescriptions. [x]   Health Diaries: If you're monitoring any health conditions at home, keeping a diary of your readings can be very helpful for discussions at your next appointment.  Billing [x]   X-ray & Lab Orders: These are billed by separate companies. Contact the invoicing company directly for questions or concerns. [x]   Visit Charges: Discuss any billing inquiries with our administrative services team.  Your Satisfaction Matters [x]   Share Your Experience: We strive for your satisfaction! If you have any complaints, or preferably compliments, please let Dr. Jon Billings know directly or contact our Practice Administrators, Edwena Felty or [employer], by asking at the front desk.   Reviewing Your Records [x]   Review this early draft of your clinical encounter  notes below and the final encounter summary tomorrow on MyChart after its been completed.  All orders placed so far are visible here: Major depressive disorder, single episode, in remission (HCC) -     Escitalopram Oxalate; Take 1 tablet (10 mg total) by mouth daily.  Dispense: 30 tablet; Refill: 0 -     Pregnancy, urine  Insomnia due to other mental disorder -     QUEtiapine Fumarate; Take 1 tablet (100 mg total) by mouth 2 (two) times daily. Take as 50 mg in morning, 150 mg in evening.  Stop duloxetine and trazodone. Ok to take with elevate.  Dispense: 90 tablet; Refill: 0 -     Escitalopram Oxalate; Take 1 tablet (10 mg total) by mouth daily.  Dispense: 30 tablet; Refill: 0 -     Pregnancy, urine  Gastroesophageal reflux disease without esophagitis -     Omeprazole; Take 1 capsule (40 mg total) by mouth daily.  Dispense: 30 capsule; Refill: 0  Birth control counseling -     Norethin Ace-Eth Estrad-FE; Take 1 tablet by mouth daily.  Dispense: 84 tablet; Refill: 4          Serotonin syndrome can occur when there is an excess of serotonin in the brain, often due to the use of multiple serotonergic medications. The patient's current medications include Lexapro (escitalopram), buspirone, and Auvelity (a combination of dextromethorphan and bupropion), all of which can increase serotonin levels. Although the patient is stopping trazodone and duloxetine, the remaining medications still pose a risk. Key symptoms of serotonin  syndrome include: Mental Status Changes: Agitation, confusion, or hallucinations. Autonomic Instability: Rapid heart rate, high blood pressure, or hyperthermia. Neuromuscular Abnormalities: Tremor, muscle rigidity, myoclonus (sudden muscle jerks), or hyperreflexia (exaggerated reflexes). Gastrointestinal Symptoms: Nausea, vomiting, or diarrhea. Systematically Ensure Accuracy & Precision Re-evaluating the patient's medication regimen and the potential for serotonin  syndrome, it is crucial to monitor for the following symptoms: Mental Status Changes: Look for signs of confusion, agitation, or hallucinations. Autonomic Instability: Monitor for rapid heart rate, high blood pressure, or elevated body temperature. Neuromuscular Abnormalities: Be vigilant for tremors, muscle rigidity, sudden muscle jerks, or exaggerated reflexes. Gastrointestinal Symptoms: Watch for nausea, vomiting, or diarrhea. Final Answer The most important ways a patient can tell they are developing serotonin syndrome while taking Lexapro 10 mg daily, buspirone 15 mg daily, and Auvelity are to monitor for mental status changes (such as confusion, agitation, or hallucinations), autonomic instability (such as rapid heart rate, high blood pressure, or hyperthermia), neuromuscular abnormalities (such as tremor, muscle rigidity, myoclonus, or hyperreflexia), and gastrointestinal symptoms (such as nausea, vomiting, or diarrhea). If any of these symptoms occur, the patient should seek immediate medical attention.    The Question  27 y.o. female 1 month follow-up has flaring depression. concerned about having weird dreams, risk of serotonin syndrome. 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; Withdrawal syndrome (HCC); Depression, major, single episode, severe (HCC); Financial difficulties; Gastroenteritis; and Unwanted pregnancy on their problem list.Medications:  ARIPiprazole, DULoxetine, Dextromethorphan HBr, Dextromethorphan-buPROPion ER, QUEtiapine, acamprosate, busPIRone, escitalopram, methocarbamol, multivitamin with minerals, naltrexone, norethindrone-ethinyl estradiol-FE, omeprazole, ondansetron, propranolol, thiamine, topiramate, and traZODoneshe asks about the safety of Elevate Avantera Mood and Vegetarian Blend which containsTurmeric Extract (Curcuma longa) (rhizome), 355 mg +Organic Lion's Mane (Hericium erinaceus) (fruiting bodies), BioPerine Black Pepper Extract (fruit)Bacopa Extract (Bacopa monnieri/herb)Bacosides, 300 mg +Avantera Focus & Energy BlendCiticoline Sodium Salt (CDP Choline), Suntheanine L-Theanine+, Caffeine from Green Tea Leaf Extract (45 mg)Other ingredients: Vegetable cellulose (hydroxypropyl methylcellulose), rice flour, and magnesium stearate.Is it safe with her medications?she is planning on stopping duloxetine and trazodone and staying on just escitalopram 10 mg daily, busprione 15 mg daily, and auvelity as her only serotenergics. Is there a better approach to balancing her need for depression meds? should she go up on seroquel to 100 twice daily to help with daily crying spells?  Design Strategy  To address this question, we will leverage a comprehensive understanding of pharmacology, drug interactions, and the management of complex psychiatric conditions. The strategy involves systematically analyzing the patient's current medications, potential interactions with the supplement, and the proposed changes to her medication regimen. We will also consider the patient's psychiatric history and current symptoms to provide a balanced and safe approach to her treatment.  Execute Strategy  1. Analyze Current Medications and Potential Interactions:    - Current Medications: ARIPiprazole, DULoxetine, Dextromethorphan HBr, Dextromethorphan-buPROPion ER, QUEtiapine, acamprosate, busPIRone, escitalopram, methocarbamol, multivitamin with  minerals, naltrexone, norethindrone-ethinyl estradiol-FE, omeprazole, ondansetron, propranolol, thiamine, topiramate, and traZODone.    - Proposed Supplement: Elevate Avantera Mood and Vegetarian Blend contains Turmeric Extract, Organic Lion's Mane, BioPerine Black Pepper Extract, Bacopa Extract, Citicoline Sodium Salt, Suntheanine L-Theanine, and  Caffeine from Green Tea Leaf Extract.  2. Evaluate Risk of Serotonin Syndrome:    - Current Serotonergic Medications: DULoxetine, Dextromethorphan-buPROPion ER, escitalopram, and traZODone.    - Proposed Changes: Stopping duloxetine and trazodone, continuing escitalopram 10 mg daily, buspirone 15 mg daily, and auvelity (dextromethorphan-bupropion ER).  3. Assess Safety of Supplement with Current Medications:    - Turmeric Extract: Generally safe but can interact with anticoagulants and antiplatelet drugs.    - Lion's Mane: Generally safe with no significant drug interactions.    - BioPerine Black Pepper Extract: Can increase the bioavailability of certain drugs.    - Bacopa Extract: Can enhance the effects of sedatives.    - Citicoline Sodium Salt: Generally safe with no significant drug interactions.    - Suntheanine L-Theanine: Generally safe with no significant drug interactions.    - Caffeine: Can interact with CNS stimulants and increase anxiety.  4. Evaluate Proposed Medication Changes:    - Stopping Duloxetine and Trazodone: Reduces the risk of serotonin syndrome but may affect depression and sleep.    - Continuing Escitalopram, Buspirone, and Auvelity: Maintains serotonergic activity but requires monitoring for interactions and efficacy.  5. Consider Increasing Seroquel (Quetiapine):    - Current Dose: Not specified.    - Proposed Dose: 100 mg twice daily.    - Indication: To help with daily crying spells and mood stabilization.  Systematically Ensure Accuracy & Precision  Re-evaluating the patient's complex medication regimen and  psychiatric history, we must ensure that any changes minimize the risk of adverse interactions and effectively manage her symptoms. The proposed supplement contains ingredients that are generally safe but require careful consideration of potential interactions, especially with her psychiatric medications. The proposed changes to her medication regimen should be carefully monitored to ensure efficacy and safety.  Final Answer  1. Safety of Elevate Avantera Mood and Vegetarian Blend:    - The supplement appears generally safe with her current medications, but caution is advised due to potential interactions with caffeine and Bacopa Extract. Monitoring for increased anxiety or sedation is recommended.  2. Proposed Medication Changes:    - Stopping duloxetine and trazodone while continuing escitalopram, buspirone, and auvelity is a reasonable approach to reduce the risk of serotonin syndrome. However, close monitoring is essential to ensure adequate management of her depression and anxiety.  3. Increasing Seroquel (Quetiapine):    - Increasing the dose to 100 mg twice daily may help with mood stabilization and daily crying spells. This should be done under close supervision to monitor for efficacy and potential side effects.  Overall, the patient's treatment plan should be closely monitored and adjusted as needed to ensure safety and efficacy. Regular follow-up appointments and open communication with her healthcare provider are essential to manage her complex psychiatric and medical conditions effectively.

## 2023-01-21 NOTE — Assessment & Plan Note (Signed)
Gastroesophageal Reflux Disease (GERD): She report heartburn and are currently on omeprazole. We will continue omeprazole for now but plan to taper off due to potential for dependency and vitamin deficiencies.  Recent flaring likely from stress

## 2023-01-21 NOTE — Progress Notes (Signed)
Natasha Pope: [phone]   Routine Medical Office Visit  Patient:  Natasha Pope [place]      Age: 27 y.o.       Sex:  female  Date:   01/21/2023 Patient Care Team: Natasha Olszewski, MD as PCP - General (Internal Medicine) Natasha Pope, Natasha Maser, MD (Inactive) as Consulting Physician (Psychiatry) Today's Healthcare Provider: [name], MD   Assessment and Plan:   Natasha Pope was seen today for 1 month follow-up.  Major depressive disorder, single episode, in remission (HCC) -     Escitalopram Oxalate; Take 1 tablet (10 mg total) by mouth daily.  Dispense: 30 tablet; Refill: 0 -     Pregnancy, urine -     Ambulatory referral to Psychiatry  Insomnia due to other mental disorder -     QUEtiapine Fumarate; Take 1 tablet (100 mg total) by mouth 2 (two) times daily. Take as 50 mg in morning, 150 mg in evening.  Stop duloxetine and trazodone. Ok to take with elevate.  Dispense: 90 tablet; Refill: 0 -     Escitalopram Oxalate; Take 1 tablet (10 mg total) by mouth daily.  Dispense: 30 tablet; Refill: 0 -     Pregnancy, urine  Gastroesophageal reflux disease without esophagitis Assessment & Plan: Gastroesophageal Reflux Disease (GERD): She report heartburn and are currently on omeprazole. We will continue omeprazole for now but plan to taper off due to potential for dependency and vitamin deficiencies.  Recent flaring likely from stress  Orders: -     Omeprazole; Take 1 capsule (40 mg total) by mouth daily.  Dispense: 30 capsule; Refill: 0  Birth control counseling -     Norethin Ace-Eth Estrad-FE; Take 1 tablet by mouth daily.  Dispense: 84 tablet; Refill: 4  Moderate episode of recurrent major depressive disorder (HCC) Overview:    01/18/2023    8:30 AM 12/11/2022   12:17 PM 10/03/2022    2:41 PM  Depression screen PHQ 2/9  Decreased Interest 1 1 1   Down, Depressed, Hopeless 2 1 1   PHQ - 2 Score 3 2 2   Altered sleeping 2 1 1   Tired, decreased  energy 1 1 1   Change in appetite 1 1 1   Feeling bad or failure about yourself  0 0 1  Trouble concentrating 0 0 0  Moving slowly or fidgety/restless 0 0 0  Suicidal thoughts 0 0 0  PHQ-9 Score 7 5 6   Difficult doing work/chores Not difficult at all  Not difficult at all     >>OVERVIEW FOR DEPRESSION, MAJOR, SINGLE EPISODE, SEVERE (HCC) WRITTEN ON 10/04/2022  2:59 PM BY Natasha Olszewski, MD      Assessment & Plan: Last Patient Health Questionnaire (PHQ) depression screen survey suggests mild but patient interaction today suggest recent severe flaring. Also last week started on Lexapro by Dr. Ruthine Pope.Severe Depression: She is currently on multiple medications with a potential for serotonin syndrome and report persistent depressive symptoms. We discussed the risks and benefits of continuing the current regimen versus simplifying and optimizing treatment. We will discontinue duloxetine and trazodone due to limited efficacy and contribution to serotonin syndrome risk, increase Seroquel to 50mg  in the morning and 150mg  at night for mood stabilization and sleep, and continue Lexapro 10mg  daily and buspirone 15mg  daily. We will consider discontinuing Abilify due to overlap with Seroquel and Topiramate due to potential interference with birth control efficacy and unclear benefit. Omeprazole will continue for heartburn, but we plan to taper off due to  potential for dependency and vitamin deficiencies. She will return for follow-up in 1 week to assess response to medication changes.Anxiety: She report benefit from buspirone, which will continue at 15mg  daily.  Supplement Use: She inquired about the safety of the Elevate supplement. It is deemed safe for use, but there is a potential for increased anxiety due to caffeine content. The decision on continuation will be made by her.       Encounter for surveillance of contraceptive pills Overview: Birth Control: She is currently on oral contraception, but  efficacy may be compromised by Topiramate. The decision regarding the continuation of Topiramate will be made by her. Doesn't want implants.      Recommended follow up: No follow-ups on file.  Future Appointments  Date Time Provider Department Center  01/30/2023  2:20 PM Natasha Olszewski, MD [facility]           Clinical Presentation:    27 y.o. female who 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; Financial difficulties; Gastroenteritis; Contraceptive management; and GERD (gastroesophageal reflux disease) on their problem list. Her reasons/main concerns/chief complaints for today's office visit are 1 month follow-up   AI-Extracted: Discussed the use of AI scribe software for clinical note transcription with the patient, who gave verbal consent to proceed.  History of Present Illness   The patient, with a history of depression and recent postpartum status, presents with a significant increase in tearfulness and emotional distress. She reports that the depressive symptoms have worsened since undergoing an abortion procedure on May 11th. The patient describes episodes of uncontrollable crying, which she attributes to the postpartum period and the emotional aftermath of the abortion. She denies any feelings of regret regarding the abortion and does not report any suicidal ideation.  The patient is currently on a complex medication regimen, including Lexapro,  Ovility, buspirone, duloxetine, trazodone, and Seroquel, among others. She expresses a desire to reduce the number of medications she is taking and discuss the possibility of increasing the dosage of Lexapro as a potential solution. She also expresses a preference for Seroquel over Abilify, which she has been taking for an unspecified period.  The patient reports experiencing unusual dreams and periods of confusion, during which she struggles to discern whether she is awake or asleep. She also mentions experiencing significant heartburn, for which she is taking omeprazole. The patient expresses concern about potential weight gain if she discontinues Topamax, which she is taking for weight loss.  The patient also mentions a recent prescription for a supplement called Elevate, which she is considering taking as a natural alternative to her current medications. She expresses a desire to manage her depression in the safest way possible and is open to exploring different treatment options.  In addition to her mental health concerns, the patient also mentions a recent prescription for birth control. She expresses uncertainty about the reliability of birth control, given her recent unexpected pregnancy. She also expresses reluctance to use long-term contraceptive methods such as Nexplanon due to personal discomfort with having a device implanted in her body.        Reviewed  chart data: Past Medical History:  Diagnosis Date   Anxiety    Asthma    Depression    Gastroenteritis 10/22/2022   Nausea, vomiting, and diarrhea x 3 days Secondary dehydration-> dizziness, low blood pressure, dry tongue Bad pain bilateral headache(s) started yesterday, likely from dehydration.  No abdomen pain. No fevers Patient denies any other associated symptom(s)    History of alcohol withdrawal delirium 07/04/2022   We discussed her recent relapse and then withdrawal seizures and she felt 90% certain that the return to  alcohol use  was due to manic depression as the main trigger so I sent fyi to [name] her psychiatrist to consider medication changes based on this, but don't intend to adjust regimen for that. 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 Suspect due to vitamin deficiency Recommended she take daily mvi and continue to abstain from EtOH   Nausea vomiting and diarrhea 06/27/2022   Just for past week or two.   Nicotine dependence due to vaping tobacco product 01/31/2018   Has been smoking since she was 28 years old.    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   Technique we just overweight by BMI but she puts a lot of the weight on her abdomen so I am documenting this as obesity is at least truncal obesity therefore I am going to try to send in for [facility] and see if insurance will cover as not only would help weight loss but actually will reduce her desire to drink   PTSD (post-traumatic stress disorder) 03/19/2022   Pyelonephritis    Seizure-like activity (HCC)    Seizures (HCC)    Sepsis (HCC)    Snoring 04/17/2022   Thrombocytopenia (HCC) 05/19/2018   Lab Results  Component  Value  Date/Time     PLT  285  08/03/2022 02:36 PM     PLT  133 (L)  06/28/2022 06:11 AM     PLT  186  06/27/2022 05:13 AM     PLT  203  06/26/2022 06:52 PM     PLT  323  03/08/2022 12:07 PM     PLT  247  10/03/2020 04:03 PM         Unwanted pregnancy 11/26/2022   Withdrawal syndrome (HCC) 09/27/2022   Severe depression past 3 days out of proportion to triggers, suspicious for withdrawal from [place] that she recently ran out of.  Also recent change of seroquel considered but we went 50->100 and its helping her sleep and usually helps severe depression      Outpatient Medications Prior to Visit  Medication Sig    acamprosate (CAMPRAL) 333 MG tablet Take 2 tablets (666 mg total) by mouth 3 (three) times daily with meals.   busPIRone (BUSPAR) 15 MG tablet Take 1 tablet (15 mg total) by mouth 3 (three) times daily. Must be taken consistently.   Dextromethorphan-buPROPion ER (AUVELITY) 45-105 MG TBCR Take 1 tablet by mouth 2 (two) times daily. To start, take just one tablet once daily in the morning. After 3 days, increase to the maximum recommended dosage of one tablet twice daily, given at least 8 hours apart. Do not exceed two doses within the same day.   methocarbamol (ROBAXIN) 500 MG tablet Take 1 tablet (500 mg total) by mouth  4 (four) times daily.   Multiple Vitamin (MULTIVITAMIN WITH MINERALS) TABS tablet Take 1 tablet by mouth daily.   ondansetron (ZOFRAN-ODT) 4 MG disintegrating tablet Take 1 tablet (4 mg total) by mouth every 8 (eight) hours as needed for nausea or vomiting.   propranolol (INDERAL) 20 MG tablet Take 1 tablet (20 mg total) by mouth 3 (three) times daily.   topiramate (TOPAMAX) 25 MG tablet Take 2 tablets (50 mg total) by mouth 2 (two) times daily.   [DISCONTINUED] ARIPiprazole (ABILIFY) 2 MG tablet Take 1 tablet (2 mg total) by mouth daily.   [DISCONTINUED] DULoxetine (CYMBALTA) 30 MG capsule Take 30 mg by mouth daily.   [DISCONTINUED] escitalopram (LEXAPRO) 10 MG tablet Take 1 tablet (10 mg total) by mouth daily.   [DISCONTINUED] norethindrone-ethinyl estradiol-FE (HAILEY FE 1/20) 1-20 MG-MCG tablet Take 1 tablet by mouth daily.   [DISCONTINUED] omeprazole (PRILOSEC) 40 MG capsule Take 1 capsule (40 mg total) by mouth daily.   [DISCONTINUED] QUEtiapine (SEROQUEL) 100 MG tablet Take 1 tablet (100 mg total) by mouth at bedtime. Replaces 50 mg dosing but please limit to minimum necessary   [DISCONTINUED] traZODone (DESYREL) 100 MG tablet Take 1 tablet (100 mg total) by mouth at bedtime.   naltrexone (DEPADE) 50 MG tablet Take 1 tablet (50 mg total) by mouth daily. (Patient not taking:  Reported on 01/21/2023)   thiamine (VITAMIN B-1) 100 MG tablet Take 1 tablet (100 mg total) by mouth daily. (Patient not taking: Reported on 01/21/2023)   No facility-administered medications prior to visit.         Clinical Data Analysis:   Physical Exam  BP 114/88 (BP Location: Left Arm, Patient Position: Sitting)   Pulse 98   Temp 97.9 F (36.6 C) (Temporal)   Ht 5' 5.5" (1.664 m)   Wt 195 lb 12.8 oz (88.8 kg)   SpO2 96%   BMI 32.09 kg/m  Wt Readings from Last 10 Encounters:  01/21/23 195 lb 12.8 oz (88.8 kg)  01/18/23 188 lb 6 oz (85.4 kg)  12/11/22 194 lb 6.4 oz (88.2 kg)  11/26/22 191 lb (86.6 kg)  11/23/22 189 lb (85.7 kg)  11/12/22 187 lb (84.8 kg)  10/22/22 181 lb 6.4 oz (82.3 kg)  10/03/22 193 lb 6.4 oz (87.7 kg)  09/26/22 195 lb 9.6 oz (88.7 kg)  09/20/22 192 lb 3.2 oz (87.2 kg)   Vital signs reviewed.  Nursing notes reviewed. Weight trend reviewed. Abnormalities and Problem-Specific physical exam findings:  near tearfully depressed but hygiene and grooming are excellint  General Appearance:  No acute distress appreciable.   Well-groomed, healthy-appearing female.  Well proportioned with no abnormal fat distribution.  Good muscle tone. Skin: Clear and well-hydrated. Pulmonary:  Normal work of breathing at rest, no respiratory distress apparent. SpO2: 96 %  Musculoskeletal: All extremities are intact.  Neurological:  Awake, alert, oriented, and engaged.  No obvious focal neurological deficits or cognitive impairments.  Sensorium seems unclouded.   Speech is clear and coherent with logical content. Psychiatric:  Appropriate mood, pleasant and cooperative demeanor, thoughtful and engaged during the exam  Results Reviewed:    No results found for any visits on 01/21/23.  Office Visit on 08/29/2022  Component Date Value   High risk HPV 08/29/2022 Negative    Neisseria Gonorrhea 08/29/2022 Negative    Chlamydia 08/29/2022 Negative    Trichomonas 08/29/2022 Negative     Adequacy 08/29/2022 Satisfactory for evaluation; transformation zone component PRESENT.    Diagnosis 08/29/2022 - Negative  for intraepithelial lesion or malignancy (NILM)    Microorganisms 08/29/2022 Shift in flora suggestive of bacterial vaginosis    Comment 08/29/2022 Normal Reference Range HPV - Negative    Comment 08/29/2022 Normal Reference Range Trichomonas - Negative    Comment 08/29/2022 Normal Reference Ranger Chlamydia - Negative    Comment 08/29/2022 Normal Reference Range Neisseria Gonorrhea - Negative    RPR Ser Ql 08/29/2022 NON-REACTIVE    HIV 1&2 Ab, 4th Generati* 08/29/2022 NON-REACTIVE    Hepatitis C Ab 08/29/2022 NON-REACTIVE   Office Visit on 08/03/2022  Component Date Value   SARS Coronavirus 2 Ag 08/03/2022 Negative    Influenza A, POC 08/03/2022 Negative    Influenza B, POC 08/03/2022 Negative    Rapid Strep A Screen 08/03/2022 Negative    Color, Urine 08/03/2022 ORANGE (A)    APPearance 08/03/2022 SL CLOUDY (A)    Specific Gravity, Urine 08/03/2022 1.015    pH 08/03/2022 8.0    Total Protein, Urine 08/03/2022 30 (A)    Urine Glucose 08/03/2022 NEGATIVE    Ketones, ur 08/03/2022 15 (A)    Bilirubin Urine 08/03/2022 SMALL (A)    Hgb urine dipstick 08/03/2022 NEGATIVE    Urobilinogen, UA 08/03/2022 2.0 (A)    Leukocytes,Ua 08/03/2022 NEGATIVE    Nitrite 08/03/2022 NEGATIVE    WBC, UA 08/03/2022 0-2/hpf    RBC / HPF 08/03/2022 0-2/hpf    Squamous Epithelial / HPF 08/03/2022 Many(>10/hpf) (A)    Bacteria, UA 08/03/2022 Few(10-50/hpf) (A)    Epithelial Casts, UA 08/03/2022 Presence of (A)    Lipase 08/03/2022 6 (L)    Glucose, Bld 08/03/2022 114 (H)    BUN 08/03/2022 4 (L)    Creat 08/03/2022 0.74    BUN/Creatinine Ratio 08/03/2022 5 (L)    Sodium 08/03/2022 135    Potassium 08/03/2022 3.4 (L)    Chloride 08/03/2022 97 (L)    CO2 08/03/2022 18 (L)    Calcium 08/03/2022 9.1    Total Protein 08/03/2022 7.9    Albumin 08/03/2022 4.5    Globulin  08/03/2022 3.4    AG Ratio 08/03/2022 1.3    Total Bilirubin 08/03/2022 0.7    Alkaline phosphatase (AP* 08/03/2022 69    AST 08/03/2022 21    ALT 08/03/2022 18    WBC 08/03/2022 12.4 (H)    RBC 08/03/2022 4.09    Hemoglobin 08/03/2022 13.7    HCT 08/03/2022 39.2    MCV 08/03/2022 95.8    MCH 08/03/2022 33.5 (H)    MCHC 08/03/2022 34.9    RDW 08/03/2022 15.6 (H)    Platelets 08/03/2022 285    MPV 08/03/2022 10.8    Amylase 08/03/2022 66   Admission on 06/26/2022, Discharged on 06/29/2022  Component Date Value   WBC 06/26/2022 7.0    RBC 06/26/2022 3.53 (L)    Hemoglobin 06/26/2022 12.8    HCT 06/26/2022 36.6    MCV 06/26/2022 103.7 (H)    MCH 06/26/2022 36.3 (H)    MCHC 06/26/2022 35.0    RDW 06/26/2022 17.6 (H)    Platelets 06/26/2022 203    nRBC 06/26/2022 0.6 (H)    Sodium 06/26/2022 136    Potassium 06/26/2022 2.9 (L)    Chloride 06/26/2022 94 (L)    CO2 06/26/2022 24    Glucose, Bld 06/26/2022 164 (H)    BUN 06/26/2022 6    Creatinine, Ser 06/26/2022 0.77    Calcium 06/26/2022 9.0    Total Protein 06/26/2022 7.3    Albumin 06/26/2022 4.3  AST 06/26/2022 65 (H)    ALT 06/26/2022 30    Alkaline Phosphatase 06/26/2022 86    Total Bilirubin 06/26/2022 1.5 (H)    GFR, Estimated 06/26/2022 >60    Anion gap 06/26/2022 18 (H)    Color, Urine 06/27/2022 AMBER (A)    APPearance 06/27/2022 HAZY (A)    Specific Gravity, Urine 06/27/2022 1.024    pH 06/27/2022 7.0    Glucose, UA 06/27/2022 NEGATIVE    Hgb urine dipstick 06/27/2022 NEGATIVE    Bilirubin Urine 06/27/2022 SMALL (A)    Ketones, ur 06/27/2022 20 (A)    Protein, ur 06/27/2022 100 (A)    Nitrite 06/27/2022 NEGATIVE    Leukocytes,Ua 06/27/2022 NEGATIVE    RBC / HPF 06/27/2022 0-5    WBC, UA 06/27/2022 0-5    Bacteria, UA 06/27/2022 NONE SEEN    Squamous Epithelial / HPF 06/27/2022 6-10    Mucus 06/27/2022 PRESENT    Hyaline Casts, UA 06/27/2022 PRESENT    Preg Test, Ur 06/27/2022 NEGATIVE    Alcohol,  Ethyl (B) 06/26/2022 <10    Lactic Acid, Venous 06/26/2022 1.5    Opiates 06/27/2022 NONE DETECTED    Cocaine 06/27/2022 NONE DETECTED    Benzodiazepines 06/27/2022 POSITIVE (A)    Amphetamines 06/27/2022 NONE DETECTED    Tetrahydrocannabinol 06/27/2022 POSITIVE (A)    Barbiturates 06/27/2022 NONE DETECTED    HIV Screen 4th Generatio* 06/27/2022 Non Reactive    Magnesium 06/27/2022 1.5 (L)    Phosphorus 06/27/2022 3.2    WBC 06/27/2022 7.2    RBC 06/27/2022 3.27 (L)    Hemoglobin 06/27/2022 11.8 (L)    HCT 06/27/2022 34.0 (L)    MCV 06/27/2022 104.0 (H)    MCH 06/27/2022 36.1 (H)    MCHC 06/27/2022 34.7    RDW 06/27/2022 17.8 (H)    Platelets 06/27/2022 186    nRBC 06/27/2022 0.8 (H)    Sodium 06/27/2022 135    Potassium 06/27/2022 3.3 (L)    Chloride 06/27/2022 94 (L)    CO2 06/27/2022 26    Glucose, Bld 06/27/2022 118 (H)    BUN 06/27/2022 6    Creatinine, Ser 06/27/2022 0.77    Calcium 06/27/2022 8.7 (L)    Total Protein 06/27/2022 6.7    Albumin 06/27/2022 4.2    AST 06/27/2022 46 (H)    ALT 06/27/2022 25    Alkaline Phosphatase 06/27/2022 76    Total Bilirubin 06/27/2022 1.3 (H)    GFR, Estimated 06/27/2022 >60    Anion gap 06/27/2022 15    Sodium 06/28/2022 137    Potassium 06/28/2022 2.5 (LL)    Chloride 06/28/2022 99    CO2 06/28/2022 26    Glucose, Bld 06/28/2022 93    BUN 06/28/2022 <5 (L)    Creatinine, Ser 06/28/2022 0.71    Calcium 06/28/2022 9.2    Total Protein 06/28/2022 6.6    Albumin 06/28/2022 4.0    AST 06/28/2022 51 (H)    ALT 06/28/2022 26    Alkaline Phosphatase 06/28/2022 72    Total Bilirubin 06/28/2022 1.2    GFR, Estimated 06/28/2022 >60    Anion gap 06/28/2022 12    WBC 06/28/2022 5.8    RBC 06/28/2022 3.47 (L)    Hemoglobin 06/28/2022 12.6    HCT 06/28/2022 36.6    MCV 06/28/2022 105.5 (H)    MCH 06/28/2022 36.3 (H)    MCHC 06/28/2022 34.4    RDW 06/28/2022 18.1 (H)    Platelets 06/28/2022 133 (L)  nRBC 06/28/2022 0.7 (H)     Magnesium 06/28/2022 2.0    Sodium 06/28/2022 138    Potassium 06/28/2022 5.8 (H)    Chloride 06/28/2022 107    CO2 06/28/2022 19 (L)    Glucose, Bld 06/28/2022 99    BUN 06/28/2022 <5 (L)    Creatinine, Ser 06/28/2022 1.22 (H)    Calcium 06/28/2022 9.5    GFR, Estimated 06/28/2022 >60    Anion gap 06/28/2022 12    Sodium 06/29/2022 139    Potassium 06/29/2022 3.6    Chloride 06/29/2022 105    CO2 06/29/2022 22    Glucose, Bld 06/29/2022 88    BUN 06/29/2022 <5 (L)    Creatinine, Ser 06/29/2022 0.69    Calcium 06/29/2022 9.2    GFR, Estimated 06/29/2022 >60    Anion gap 06/29/2022 12    Magnesium 06/29/2022 1.7    TSH 06/29/2022 5.813 (H)   Office Visit on 06/01/2022  Component Date Value   Preg Test, Ur 06/01/2022 Negative    SARS Coronavirus 2 Ag 06/01/2022 Negative    Influenza A, POC 06/01/2022 Negative    Influenza B, POC 06/01/2022 Negative   Office Visit on 03/19/2022  Component Date Value   Sodium 03/19/2022 139    Potassium 03/19/2022 4.6    Chloride 03/19/2022 102    CO2 03/19/2022 22    Glucose, Bld 03/19/2022 81    BUN 03/19/2022 7    Creatinine, Ser 03/19/2022 0.83    Total Bilirubin 03/19/2022 0.5    Alkaline Phosphatase 03/19/2022 60    AST 03/19/2022 27    ALT 03/19/2022 14    Total Protein 03/19/2022 8.2    Albumin 03/19/2022 4.7    GFR 03/19/2022 98.05    Calcium 03/19/2022 9.9   Lab on 03/08/2022  Component Date Value   WBC 03/08/2022 6.5    RBC 03/08/2022 3.44 (L)    Hemoglobin 03/08/2022 12.0    HCT 03/08/2022 33.8 (L)    MCV 03/08/2022 98.3    MCH 03/08/2022 34.9 (H)    MCHC 03/08/2022 35.5    RDW 03/08/2022 22.9 (H)    Platelets 03/08/2022 323    MPV 03/08/2022 10.0    Neutro Abs 03/08/2022 4,069    Lymphs Abs 03/08/2022 2,035    Absolute Monocytes 03/08/2022 332    Eosinophils Absolute 03/08/2022 33    Basophils Absolute 03/08/2022 33    Neutrophils Relative % 03/08/2022 62.6    Total Lymphocyte 03/08/2022 31.3    Monocytes  Relative 03/08/2022 5.1    Eosinophils Relative 03/08/2022 0.5    Basophils Relative 03/08/2022 0.5   Office Visit on 03/07/2022  Component Date Value   Sodium 03/07/2022 140    Potassium 03/07/2022 2.9 (L)    Chloride 03/07/2022 103    CO2 03/07/2022 24    Glucose, Bld 03/07/2022 98    BUN 03/07/2022 4 (L)    Creatinine, Ser 03/07/2022 0.75    Total Bilirubin 03/07/2022 0.3    Alkaline Phosphatase 03/07/2022 53    AST 03/07/2022 25    ALT 03/07/2022 15    Total Protein 03/07/2022 7.6    Albumin 03/07/2022 4.6    GFR 03/07/2022 110.76    Calcium 03/07/2022 8.7    WBC 03/07/2022 6.4    RBC 03/07/2022 3.42 (L)    Hemoglobin 03/07/2022 11.9 (L)    HCT 03/07/2022 35.5 (L)    MCV 03/07/2022 103.9 (H)    MCHC 03/07/2022 33.4    RDW 03/07/2022 26.1 (H)  Platelets 03/07/2022 308.0    Neutrophils Relative % 03/07/2022 61.6    Lymphocytes Relative 03/07/2022 30.6    Monocytes Relative 03/07/2022 5.4    Eosinophils Relative 03/07/2022 0.6    Basophils Relative 03/07/2022 1.8    Neutro Abs 03/07/2022 3.9    Lymphs Abs 03/07/2022 1.9    Monocytes Absolute 03/07/2022 0.3    Eosinophils Absolute 03/07/2022 0.0    Basophils Absolute 03/07/2022 0.1    Lyme Total Antibody EIA 03/07/2022 Negative    No image results found.   No results found.     This encounter employed real-time, collaborative documentation. The patient actively reviewed and updated their medical record on a shared screen, ensuring transparency and facilitating joint problem-solving for the problem list, overview, and plan. This approach promotes accurate, informed care. The treatment plan was discussed and reviewed in detail, including medication safety, potential side effects, and all patient questions. We confirmed understanding and comfort with the plan. Follow-up instructions were established, including contacting the office for any concerns, returning if symptoms worsen, persist, or new symptoms develop, and  precautions for potential emergency department visits.   Attestation:  I have personally spent  36 minutes involved in face-to-face and non-face-to-face activities for this patient on the day of the visit. Professional time spent includes the following activities:  Preparing to see the patient by reviewing medical records prior to and during the encounter; Obtaining, documenting, and reviewing an updated medical history; Performing a medically appropriate examination;  Evaluating, synthesizing, and documenting the available clinical information in the EMR;  Coordinating/Communicating with other health care professionals; Independently interpreting results (not separately reported), Communicating, counseling, educating about results to the patient/family/caregiver (not separately reported); Collaboratively developing and communicating an individualized treatment plan with the patient; Placing medically necessary orders (for medications/tests/procedures/referrals);   This time was independent of any separately billable procedure(s).  The extended duration of this patient visit was medically necessary due to several factors:  The patient's health condition is multifaceted, requiring a comprehensive evaluation of patient and their past records to ensure accurate diagnosis and treatment planning; Effective patient education and communication, particularly for patients with complex care needs, often require additional time to ensure the patient (or caregivers) fully understand the care plan;  Coordination of care with other healthcare professionals and services depends on thorough documentation, extending both documentation time and visit durations.  All these factors are integral to providing high-quality patient care and ensuring optimal health outcomes.  ----------------------------------------------------- Natasha Olszewski, MD  01/21/2023 8:03 PM  [facility] at [facility]:  [phone]

## 2023-01-21 NOTE — Assessment & Plan Note (Addendum)
Last Patient Health Questionnaire (PHQ) depression screen survey suggests mild but patient interaction today suggest recent severe flaring. Also last week started on Lexapro by Dr. Ruthine Dose.Severe Depression: She is currently on multiple medications with a potential for serotonin syndrome and report persistent depressive symptoms. We discussed the risks and benefits of continuing the current regimen versus simplifying and optimizing treatment. We will discontinue duloxetine and trazodone due to limited efficacy and contribution to serotonin syndrome risk, increase Seroquel to 50mg  in the morning and 150mg  at night for mood stabilization and sleep, and continue Lexapro 10mg  daily and buspirone 15mg  daily. We will consider discontinuing Abilify due to overlap with Seroquel and Topiramate due to potential interference with birth control efficacy and unclear benefit. Omeprazole will continue for heartburn, but we plan to taper off due to potential for dependency and vitamin deficiencies. She will return for follow-up in 1 week to assess response to medication changes.Anxiety: She report benefit from buspirone, which will continue at 15mg  daily.  Supplement Use: She inquired about the safety of the Elevate supplement. It is deemed safe for use, but there is a potential for increased anxiety due to caffeine content. The decision on continuation will be made by her.

## 2023-01-25 ENCOUNTER — Other Ambulatory Visit (HOSPITAL_COMMUNITY): Payer: Self-pay

## 2023-01-25 ENCOUNTER — Telehealth: Payer: Self-pay

## 2023-01-25 NOTE — Telephone Encounter (Signed)
Patient Advocate Encounter  Prior Authorization for Quetiapine has been approved with [employer].    PA# [id number] Effective dates: 01/25/23 through 01/25/24  Placed a call to [employer] to notify of the approval. Per the pharmacist, received a paid claim for $4.  Approval letter indexed to chart.

## 2023-01-25 NOTE — Telephone Encounter (Signed)
Pharmacy Patient Advocate Encounter   Received notification that prior authorization for Quetapine 100mg  is required/requested.   PA submitted to  [employer]  via CoverMyMeds Key # [id number] Status is pending

## 2023-01-28 ENCOUNTER — Other Ambulatory Visit (HOSPITAL_COMMUNITY): Payer: Self-pay

## 2023-01-28 NOTE — Telephone Encounter (Signed)
Left vm for patient informing her.

## 2023-01-30 ENCOUNTER — Telehealth: Payer: Medicaid Other | Admitting: Internal Medicine

## 2023-02-01 ENCOUNTER — Other Ambulatory Visit: Payer: Self-pay | Admitting: Nurse Practitioner

## 2023-02-01 ENCOUNTER — Ambulatory Visit: Payer: Medicaid Other | Admitting: Family

## 2023-02-01 ENCOUNTER — Encounter: Payer: Self-pay | Admitting: Family

## 2023-02-01 VITALS — BP 132/88 | HR 118 | Temp 98.7°F | Ht 65.0 in | Wt 186.0 lb

## 2023-02-01 DIAGNOSIS — B3781 Candidal esophagitis: Secondary | ICD-10-CM

## 2023-02-01 DIAGNOSIS — R739 Hyperglycemia, unspecified: Secondary | ICD-10-CM

## 2023-02-01 DIAGNOSIS — B370 Candidal stomatitis: Secondary | ICD-10-CM

## 2023-02-01 DIAGNOSIS — J029 Acute pharyngitis, unspecified: Secondary | ICD-10-CM | POA: Diagnosis not present

## 2023-02-01 DIAGNOSIS — F411 Generalized anxiety disorder: Secondary | ICD-10-CM | POA: Diagnosis not present

## 2023-02-01 LAB — POCT RAPID STREP A (OFFICE): Rapid Strep A Screen: NEGATIVE

## 2023-02-01 LAB — POC COVID19 BINAXNOW: SARS Coronavirus 2 Ag: NEGATIVE

## 2023-02-01 MED ORDER — ONDANSETRON HCL 4 MG PO TABS: 4.0000 mg | ORAL_TABLET | Freq: Three times a day (TID) | ORAL | 0 refills | Status: DC | PRN

## 2023-02-01 MED ORDER — LIDOCAINE VISCOUS HCL 2 % MT SOLN: 5.0000 mL | Freq: Four times a day (QID) | OROMUCOSAL | 0 refills | Status: DC

## 2023-02-01 NOTE — Progress Notes (Unsigned)
Acute Office Visit  Subjective:     Patient ID: Natasha Pope, female    DOB: 1995/08/21, 27 y.o.   MRN: [id number]  Chief Complaint  Patient presents with  . Sore Throat    Sore throat x 3 days. With cough, headache, NV.  . Numbness    Pt states she started having numbness on her right side after laying down tat has still not gone away.     HPI Patient is in today with complaints of sore throat x 3 days with a cough, headache, nausea and vomiting.  She was concerned about having strep throat.  Has not taken anything for her symptoms at this point.  Also reports that she has been off of her psychiatric medication for the last 2 days due to the nausea.  Patient also reports that she has numbness in her pinky finger that has been present x 2 days.  She reports laying on the area, it going numb and has not returned back to baseline.  Denies any pain.  No burning or tingling.  Review of Systems  HENT:  Positive for sore throat.   Gastrointestinal:  Positive for nausea and vomiting.  Musculoskeletal: Negative.   Neurological:  Positive for headaches.       Numbness right pinky  Psychiatric/Behavioral:  The patient is nervous/anxious.   All other systems reviewed and are negative. Past Medical History:  Diagnosis Date  . Anxiety   . Asthma   . Depression   . Gastroenteritis 10/22/2022   Nausea, vomiting, and diarrhea x 3 days Secondary dehydration-> dizziness, low blood pressure, dry tongue Bad pain bilateral headache(s) started yesterday, likely from dehydration.  No abdomen pain. No fevers Patient denies any other associated symptom(s)   . 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 She also reports ongoing auditory/visual hallucinations and has history of delusion disorder and   . 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 Suspect due to vitamin deficiency Recommended she take daily mvi and continue to abstain from EtOH  . Nausea vomiting and diarrhea 06/27/2022   Just for past week or two.  . Nicotine dependence due to vaping tobacco product 01/31/2018   Has been smoking since she was 27 years old.   . 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   Technique we just overweight by BMI but she puts a lot of the weight on her abdomen so I am documenting this as obesity is at least truncal obesity therefore I am going to try to send in for [facility] and see if insurance will cover as not only would help weight loss but actually will reduce her desire to drink  . PTSD (post-traumatic stress disorder) 03/19/2022  . Pyelonephritis   . Seizure-like activity (HCC)   . Seizures (HCC)   . Sepsis (HCC)   . Snoring 04/17/2022  . Thrombocytopenia (HCC) 05/19/2018   Lab Results  Component  Value  Date/Time     PLT  285  08/03/2022 02:36 PM     PLT  133 (L)  06/28/2022  06:11 AM     PLT  186  06/27/2022 05:13 AM     PLT  203  06/26/2022 06:52 PM     PLT  323  03/08/2022 12:07 PM     PLT  247  10/03/2020 04:03 PM        . Unwanted pregnancy 11/26/2022  . Withdrawal syndrome (HCC) 09/27/2022   Severe depression past 3 days out of proportion to triggers, suspicious for withdrawal from [place] that she recently ran out of.  Also recent change of seroquel considered but we went 50->100 and its helping her sleep and usually helps severe depression      Social History   Socioeconomic History  . Marital status: Single    Spouse name: Not on file  . Number of children: 0  . Years of education: some college  . Highest education level: High school graduate  Occupational  History  . 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 Smoking status: Every Day    Current packs/day: 0.00    Types: Cigarettes, E-cigarettes    Last attempt to quit: 05/10/2018    Years since quitting: 4.7  . Smokeless tobacco: Never  . Tobacco comments:    VAPES  Vaping Use  . Vaping status: Never Used  Substance and Sexual Activity  . Alcohol use: Not Currently    Comment: 1 quit alcohol intake 12/2021  . Drug use: Not Currently    Types: Marijuana  . Sexual activity: Yes    Partners: Male    Birth control/protection: Pill, OCP    Comment: menarche 27yo, sexual debut 27yo  Other Topics Concern  . Not on file  Social History Narrative   Currently living in [facility], a half-way home.  She has 5 roommates.   Social Determinants of Health   Financial Resource Strain: Low Risk  (05/20/2018)   Overall Financial Resource Strain (CARDIA)   . Difficulty of Paying Living Expenses: Not hard at all  Food Insecurity: No Food Insecurity (05/20/2018)   Hunger Vital Sign   . Worried About Programme researcher, broadcasting/film/video in the Last Year: Never true   . Ran Out of Food in the Last Year: Never true  Transportation Needs: No Transportation Needs (05/20/2018)   PRAPARE - Transportation   . Lack of Transportation (Medical): No   . Lack of Transportation (Non-Medical): No  Physical Activity: Sufficiently Active (05/20/2018)   Exercise Vital Sign   . Days of Exercise per Week: 7 days   . Minutes of Exercise per Session: 70 min  Stress: Stress Concern Present (05/20/2018)   [employer] of Occupational Health - Occupational Stress Questionnaire   . Feeling of Stress : Rather much  Social Connections: Moderately Isolated (05/20/2018)   Social Connection and Isolation Panel [NHANES]   . Frequency of Communication with Friends and Family: More than three times a week   . Frequency of Social Gatherings with Friends and Family: Once a week   .  Attends Religious Services: Never   . Active Member of Clubs or Organizations: No   . Attends Banker Meetings: Never   . Marital Status: Never married  Intimate Partner Violence: Not At Risk (05/20/2018)   Humiliation, Afraid, Rape, and Kick questionnaire   . Fear of Current or Ex-Partner: No   . Emotionally Abused: No   . Physically Abused: No   . Sexually Abused: No    Past Surgical History:  Procedure Laterality Date  . dental procedure      Family History  Problem Relation Age of Onset  . Depression Mother   . Miscarriages / [place] Mother   . Thyroid disease Mother   . Hypertension Father   . Alcohol abuse Father   . Learning disabilities Sister   . Hyperlipidemia Maternal Grandmother   . Alcohol abuse Maternal Grandfather   . Alcohol abuse Paternal Grandmother   . Hypertension Paternal Grandfather     Allergies  Allergen Reactions  . Lamotrigine Hives    Current Outpatient Medications on File Prior to Visit  Medication Sig Dispense Refill  . acamprosate (CAMPRAL) 333 MG tablet Take 2 tablets (666 mg total) by mouth 3 (three) times daily with meals. 90 tablet 5  . busPIRone (BUSPAR) 15 MG tablet Take 1 tablet (15 mg total) by mouth 3 (three) times daily. Must be taken consistently. 270 tablet 3  . Dextromethorphan-buPROPion ER (AUVELITY) 45-105 MG TBCR Take 1 tablet by mouth 2 (two) times daily. To start, take just one tablet once daily in the morning. After 3 days, increase to the maximum recommended dosage of one tablet twice daily, given at least 8 hours apart. Do not exceed two doses within the same day. 180 tablet 3  . escitalopram (LEXAPRO) 10 MG tablet Take 1 tablet (10 mg total) by mouth daily. 30 tablet 0  . methocarbamol (ROBAXIN) 500 MG tablet Take 1 tablet (500 mg total) by mouth 4 (four) times daily. 120 tablet 3  . Multiple Vitamin (MULTIVITAMIN WITH MINERALS) TABS tablet Take 1 tablet by mouth daily. 30 tablet 0  . naltrexone (DEPADE)  50 MG tablet Take 1 tablet (50 mg total) by mouth daily. 90 tablet 3  . norethindrone-ethinyl estradiol-FE (HAILEY FE 1/20) 1-20 MG-MCG tablet Take 1 tablet by mouth daily. 84 tablet 4  . omeprazole (PRILOSEC) 40 MG capsule Take 1 capsule (40 mg total) by mouth daily. 30 capsule 0  . ondansetron (ZOFRAN-ODT) 4 MG disintegrating tablet Take 1 tablet (4 mg total) by mouth every 8 (eight) hours as needed for nausea or vomiting. 60 tablet 2  . propranolol (INDERAL) 20 MG tablet Take 1 tablet (20 mg total) by mouth 3 (three) times daily. 270 tablet 3  . QUEtiapine (SEROQUEL) 100 MG tablet Take 1 tablet (100 mg total) by mouth 2 (two) times daily. Take as 50 mg in morning, 150 mg in evening.  Stop duloxetine and trazodone. Ok to take with elevate. 90 tablet 0  . thiamine (VITAMIN B-1) 100 MG tablet Take 1 tablet (100 mg total) by mouth daily. 30 tablet 0  . topiramate (TOPAMAX) 25 MG tablet Take 2 tablets (50 mg total) by mouth 2 (two) times daily. 60 tablet 0  . [DISCONTINUED] Dextromethorphan HBr 15 MG TABS Take 3 tablets by mouth daily at 6 (six) AM. 90 tablet 5   No current facility-administered medications on file prior to visit.    BP 132/88   Pulse (!) 118   Temp 98.7 F (37.1 C)   Ht 5\' 5"  (1.651 m)   Wt 186 lb (84.4 kg)   SpO2 93%   BMI 30.95 kg/m chart      Objective:    BP 132/88   Pulse (!) 118   Temp 98.7 F (37.1 C)   Ht 5\' 5"  (1.651 m)   Wt 186 lb (84.4 kg)   SpO2 93%   BMI 30.95 kg/m  {Vitals History (Optional):23777}  Physical Exam Vitals reviewed.  HENT:  Right Ear: Tympanic membrane and ear canal normal.     Left Ear: Tympanic membrane and ear canal normal.     Nose: No congestion.     Comments: Thrush noted to the tongue and pharynx    Mouth/Throat:     Pharynx: Oropharyngeal exudate and posterior oropharyngeal erythema present.  Cardiovascular:     Rate and Rhythm: Normal rate and regular rhythm.  Pulmonary:     Effort: Pulmonary effort is normal.      Breath sounds: Normal breath sounds.  Musculoskeletal:     Cervical back: Normal range of motion and neck supple.  Skin:    General: Skin is warm and dry.  Neurological:     General: No focal deficit present.     Mental Status: She is oriented to person, place, and time.  Psychiatric:     Comments: Visibly anxious   No results found for any visits on 02/01/23.      Assessment & Plan:   Problem List Items Addressed This Visit     GAD (generalized anxiety disorder)   Other Visit Diagnoses     Thrush of mouth and esophagus (HCC)    -  Primary   Relevant Orders   Hemoglobin A1c   CMP   Hyperglycemia       Relevant Orders   Hemoglobin A1c   CMP   Pharyngitis, unspecified etiology       Relevant Orders   POCT rapid strep A   POC COVID-19       Meds ordered this encounter  Medications  . magic mouthwash (lidocaine, diphenhydrAMINE, alum & mag hydroxide) suspension    Sig: Swish and swallow 5 mLs 4 (four) times daily.    Dispense:  360 mL    Refill:  0   A1c obtained because she has had some hyperglycemia in the past just want to be sure that she does not have underlying diabetes considering the thrush today.  HIV is negative.  She does admit to vaping.  Labs sent and will notify patient pending results.  Magic mouthwash sent to the pharmacy gargle swish and swallow.  Call the office if symptoms worsen or persist.  Recheck as scheduled and sooner as needed.  Also add Zofran for nausea. No follow-ups on file.  Eulis Foster, FNP

## 2023-02-02 LAB — COMPREHENSIVE METABOLIC PANEL
AG Ratio: 1.6 (calc) (ref 1.0–2.5)
ALT: 52 U/L — ABNORMAL HIGH (ref 6–29)
AST: 107 U/L — ABNORMAL HIGH (ref 10–30)
Albumin: 5 g/dL (ref 3.6–5.1)
Alkaline phosphatase (APISO): 149 U/L — ABNORMAL HIGH (ref 31–125)
BUN/Creatinine Ratio: 8 (calc) (ref 6–22)
BUN: 8 mg/dL (ref 7–25)
CO2: 24 mmol/L (ref 20–32)
Calcium: 9.6 mg/dL (ref 8.6–10.2)
Chloride: 90 mmol/L — ABNORMAL LOW (ref 98–110)
Creat: 0.99 mg/dL — ABNORMAL HIGH (ref 0.50–0.96)
Globulin: 3.2 g/dL (calc) (ref 1.9–3.7)
Glucose, Bld: 99 mg/dL (ref 65–99)
Potassium: 3 mmol/L — ABNORMAL LOW (ref 3.5–5.3)
Sodium: 134 mmol/L — ABNORMAL LOW (ref 135–146)
Total Bilirubin: 0.9 mg/dL (ref 0.2–1.2)
Total Protein: 8.2 g/dL — ABNORMAL HIGH (ref 6.1–8.1)

## 2023-02-02 LAB — HEMOGLOBIN A1C
Hgb A1c MFr Bld: 5.1 % of total Hgb (ref <5.7)
Mean Plasma Glucose: 100 mg/dL
eAG (mmol/L): 5.5 mmol/L

## 2023-02-13 ENCOUNTER — Ambulatory Visit: Payer: Medicaid Other | Admitting: Internal Medicine

## 2023-02-22 ENCOUNTER — Encounter: Payer: Self-pay | Admitting: Internal Medicine

## 2023-02-22 ENCOUNTER — Ambulatory Visit: Payer: Medicaid Other | Admitting: Internal Medicine

## 2023-02-22 VITALS — BP 130/96 | HR 93 | Temp 98.2°F | Ht 65.0 in | Wt 195.4 lb

## 2023-02-22 DIAGNOSIS — F325 Major depressive disorder, single episode, in full remission: Secondary | ICD-10-CM

## 2023-02-22 DIAGNOSIS — K219 Gastro-esophageal reflux disease without esophagitis: Secondary | ICD-10-CM

## 2023-02-22 DIAGNOSIS — Z562 Threat of job loss: Secondary | ICD-10-CM | POA: Diagnosis not present

## 2023-02-22 DIAGNOSIS — N920 Excessive and frequent menstruation with regular cycle: Secondary | ICD-10-CM | POA: Diagnosis not present

## 2023-02-22 DIAGNOSIS — F331 Major depressive disorder, recurrent, moderate: Secondary | ICD-10-CM

## 2023-02-22 DIAGNOSIS — F5105 Insomnia due to other mental disorder: Secondary | ICD-10-CM | POA: Diagnosis not present

## 2023-02-22 DIAGNOSIS — R440 Auditory hallucinations: Secondary | ICD-10-CM

## 2023-02-22 DIAGNOSIS — E669 Obesity, unspecified: Secondary | ICD-10-CM

## 2023-02-22 DIAGNOSIS — F99 Mental disorder, not otherwise specified: Secondary | ICD-10-CM | POA: Diagnosis not present

## 2023-02-22 DIAGNOSIS — F1021 Alcohol dependence, in remission: Secondary | ICD-10-CM

## 2023-02-22 DIAGNOSIS — N922 Excessive menstruation at puberty: Secondary | ICD-10-CM

## 2023-02-22 MED ORDER — OMEPRAZOLE 20 MG PO CPDR
40.0000 mg | DELAYED_RELEASE_CAPSULE | Freq: Every day | ORAL | 2 refills | Status: DC
Start: 2023-02-22 — End: 2023-04-06

## 2023-02-22 MED ORDER — ESCITALOPRAM OXALATE 10 MG PO TABS
10.0000 mg | ORAL_TABLET | Freq: Every day | ORAL | 0 refills | Status: DC
Start: 2023-02-22 — End: 2023-03-04

## 2023-02-22 MED ORDER — TOPIRAMATE 25 MG PO TABS
50.0000 mg | ORAL_TABLET | Freq: Three times a day (TID) | ORAL | 1 refills | Status: DC
Start: 2023-02-22 — End: 2023-06-06

## 2023-02-22 NOTE — Progress Notes (Signed)
[facility]: [phone]   Routine Medical Office Visit  Patient:  Natasha Pope      Age: 27 y.o.       Sex:  female  Date:   02/22/2023 Patient Care Team: [name], MD as PCP - General (Internal Medicine) Pucilowski, Roosvelt Maser, MD (Inactive) as Consulting Physician (Psychiatry) Today's Healthcare Provider: [name], MD   Assessment and Plan:   Diagnoses and all orders for this visit: Gastroesophageal reflux disease without esophagitis -     omeprazole (PRILOSEC) 20 MG capsule; Take 2 capsules (40 mg total) by mouth daily. Auditory hallucinations -     Ambulatory referral to Psychiatry Insomnia due to other mental disorder -     escitalopram (LEXAPRO) 10 MG tablet; Take 1 tablet (10 mg total) by mouth daily. Major depressive disorder, single episode, in remission (HCC) -     escitalopram (LEXAPRO) 10 MG tablet; Take 1 tablet (10 mg total) by mouth daily. Obesity (BMI 30-39.9) -     topiramate (TOPAMAX) 25 MG tablet; Take 2 tablets (50 mg total) by mouth in the morning, at noon, and at bedtime. 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 We will continue their current birth control regimen and expect an improvement in bleeding with its continued use.  Gastroesophageal Reflux Disease (GERD) They are running low on omeprazole. We will refill omeprazole at a reduced dose of 20mg  and advise taking vitamins an hour before omeprazole to enhance absorption.  Depression They scored low on the depression screening and are currently on Lexapro 10mg  daily, which we will continue.  Auditory  Hallucinations Their previously experienced auditory hallucinations are now controlled with aripiprazole, which we will continue at the current dose.  Weight Management They have difficulty with appetite control and sweet cravings, currently managed with a low dose of topiramate. We will increase topiramate to three tablets daily.  Social Determinants of Health We discussed potential employment opportunities and the importance of maintaining stable housing, leading to a referral to [facility] for assistance with social determinants of health.  Follow-up is advised in 2-4 weeks or sooner if needed, particularly if symptoms of GERD worsen with the reduced omeprazole dose.      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  Missing doses causes her to be scared   02/22/23 1602    escitalopram (LEXAPRO) 10 MG tablet  Daily        02/22/23 1602    omeprazole (PRILOSEC) 20 MG capsule  Daily        02/22/23 1602    topiramate (TOPAMAX) 25 MG tablet  3 times daily        02/22/23 1602    AMB Referral to Managed Medicaid Care Management       Comments: Need financial support and employment support/assistance   02/22/23 1602            Recommended follow up: No follow-ups on file.  Future Appointments  Date Time Provider Department Center  03/04/2023  3:20 PM [name], MD  [facility]           Clinical Presentation:    27 y.o. female who has Nicotine dependence due to vaping tobacco product; Asthma; GAD (generalized anxiety disorder); ASCUS of cervix with negative high risk HPV; Macrocytosis; Substance induced mood disorder (HCC); Alcohol use with alcohol-induced mood disorder (HCC); Alcohol use disorder, severe, in early remission, dependence (HCC); Recurrent major depressive disorder (HCC); Panic disorder with agoraphobia; Delusional disorder (HCC); Auditory hallucinations;  Visual hallucination; Chronic abdominal pain; PTSD (post-traumatic stress disorder); Memory loss; Snoring; 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; Financial difficulties; Gastroenteritis; Contraceptive management; and GERD (gastroesophageal reflux disease) on their problem list. Her reasons/main concerns/chief complaints for today's office visit are 1 month follow-up (Menstrual has lasted longer than normal, still currently bleeding with clots. Her body is most likely still trying to get back to normal post abortion (May).)   AI-Extracted: Discussed the use of AI scribe software for clinical note transcription with the patient, who gave verbal consent to proceed.  History of Present Illness   The patient, with a history of auditory hallucinations and depression, presents with concerns about recent changes in their menstrual cycle. They report a longer duration of menstruation, lasting for seven days, which is unusual for them, as their periods typically last for three to four days. The patient describes the menstruation as heavy, with clotting, and associated with nausea and vomiting. They have been managing these symptoms with anti-nausea medication.  The patient recently restarted birth control, which may be contributing to these changes. They also report a significant improvement in their auditory hallucinations since starting aripiprazole, with no recent episodes. They are also taking quetiapine and have recently increased their dose of topiramate in an attempt to curb their appetite for sweets.  The patient is currently unemployed and has been actively seeking employment. They express some reluctance about establishing a new relationship with a psychiatrist due to the emotional burden of revisiting past traumas. However, they acknowledge the potential benefits of psychiatric care and are open to exploring this further.  The patient denies any recent alcohol use and has not been attending any twelve-step or  similar meetings recently. They have a history of substance use and are aware of the potential risks associated with a return to alcohol use. They are currently stable in their housing situation, although they report a recent incident where they almost lost their housing due to a misunderstanding involving other individuals.  The patient is also dealing with a persistent heartburn issue, for which they have been taking Prilosec. However, the dose is being reduced in an attempt to wean them off the medication. They are also taking Lexapro for depression, which they report has been well-controlled recently. They are also taking a vitamin supplement to address potential deficiencies related to past alcohol use.        She  has a past medical history of Anxiety, Asthma, Depression, Gastroenteritis (10/22/2022), History of alcohol withdrawal delirium (07/04/2022), History of seizure due to alcohol withdrawal (02/28/2021), Memory loss (04/03/2022), Nausea vomiting and diarrhea (06/27/2022), Nicotine dependence due to vaping tobacco product (01/31/2018), 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 (04/17/2022), PTSD (post-traumatic stress disorder) (03/19/2022), Pyelonephritis, Seizure-like activity (HCC), Seizures (HCC), Sepsis (HCC), Snoring (04/17/2022), Thrombocytopenia (HCC) (05/19/2018), Unwanted pregnancy (11/26/2022), and Withdrawal syndrome (HCC) (09/27/2022). Current Outpatient Medications on File Prior to Visit  Medication Sig   acamprosate (CAMPRAL) 333 MG tablet Take 2 tablets (666 mg total) by mouth 3 (three) times daily with meals.   busPIRone (BUSPAR) 15  MG tablet Take 1 tablet (15 mg total) by mouth 3 (three) times daily. Must be taken consistently.   Dextromethorphan-buPROPion ER (AUVELITY) 45-105 MG TBCR Take 1 tablet by mouth 2 (two) times daily. To start, take just one tablet once daily in the morning. After 3 days, increase to the maximum recommended dosage of one tablet twice daily, given at least 8  hours apart. Do not exceed two doses within the same day.   methocarbamol (ROBAXIN) 500 MG tablet Take 1 tablet (500 mg total) by mouth 4 (four) times daily.   Multiple Vitamin (MULTIVITAMIN WITH MINERALS) TABS tablet Take 1 tablet by mouth daily.   naltrexone (DEPADE) 50 MG tablet Take 1 tablet (50 mg total) by mouth daily.   norethindrone-ethinyl estradiol-FE (HAILEY FE 1/20) 1-20 MG-MCG tablet Take 1 tablet by mouth daily.   ondansetron (ZOFRAN) 4 MG tablet Take 1 tablet (4 mg total) by mouth every 8 (eight) hours as needed for nausea or vomiting.   ondansetron (ZOFRAN-ODT) 4 MG disintegrating tablet Take 1 tablet (4 mg total) by mouth every 8 (eight) hours as needed for nausea or vomiting.   propranolol (INDERAL) 20 MG tablet Take 1 tablet (20 mg total) by mouth 3 (three) times daily.   QUEtiapine (SEROQUEL) 100 MG tablet Take 1 tablet (100 mg total) by mouth 2 (two) times daily. Take as 50 mg in morning, 150 mg in evening.  Stop duloxetine and trazodone. Ok to take with elevate.   thiamine (VITAMIN B-1) 100 MG tablet Take 1 tablet (100 mg total) by mouth daily.   traZODone (DESYREL) 100 MG tablet Take 100 mg by mouth at bedtime.   [DISCONTINUED] Dextromethorphan HBr 15 MG TABS Take 3 tablets by mouth daily at 6 (six) AM.   No current facility-administered medications on file prior to visit.   Medications Discontinued During This Encounter  Medication Reason   magic mouthwash (lidocaine, diphenhydrAMINE, alum & mag hydroxide) suspension Completed Course   topiramate (TOPAMAX) 25 MG tablet Reorder   escitalopram (LEXAPRO) 10 MG tablet Reorder   omeprazole (PRILOSEC) 40 MG capsule Reorder         Clinical Data Analysis:   Physical Exam  BP (!) 130/96 (BP Location: Left Arm, Patient Position: Sitting)   Pulse 93   Temp 98.2 F (36.8 C) (Temporal)   Ht 5\' 5"  (1.651 m)   Wt 195 lb 6.4 oz (88.6 kg)   SpO2 99%   BMI 32.52 kg/m  Wt Readings from Last 10 Encounters:  02/22/23 195 lb  6.4 oz (88.6 kg)  02/01/23 186 lb (84.4 kg)  01/21/23 195 lb 12.8 oz (88.8 kg)  01/18/23 188 lb 6 oz (85.4 kg)  12/11/22 194 lb 6.4 oz (88.2 kg)  11/26/22 191 lb (86.6 kg)  11/23/22 189 lb (85.7 kg)  11/12/22 187 lb (84.8 kg)  10/22/22 181 lb 6.4 oz (82.3 kg)  10/03/22 193 lb 6.4 oz (87.7 kg)   Vital signs reviewed.  Nursing notes reviewed. Weight trend reviewed. Abnormalities and Problem-Specific physical exam findings:  weight gain noted.  General Appearance:  No acute distress appreciable.   Well-groomed, healthy-appearing female.  Well proportioned with no abnormal fat distribution.  Good muscle tone. Skin: Clear and well-hydrated. Pulmonary:  Normal work of breathing at rest, no respiratory distress apparent. SpO2: 99 %  Musculoskeletal: All extremities are intact.  Neurological:  Awake, alert, oriented, and engaged.  No obvious focal neurological deficits or cognitive impairments.  Sensorium seems unclouded.   Speech is clear and  coherent with logical content. Psychiatric:  Appropriate mood, pleasant and cooperative demeanor, thoughtful and engaged during the exam  Results Reviewed:      No results found for any visits on 02/22/23.  Office Visit on 02/01/2023  Component Date Value   Rapid Strep A Screen 02/01/2023 Negative    SARS Coronavirus 2 Ag 02/01/2023 Negative    Glucose, Bld 02/01/2023 99    BUN 02/01/2023 8    Creat 02/01/2023 0.99 (H)    BUN/Creatinine Ratio 02/01/2023 8    Sodium 02/01/2023 134 (L)    Potassium 02/01/2023 3.0 (L)    Chloride 02/01/2023 90 (L)    CO2 02/01/2023 24    Calcium 02/01/2023 9.6    Total Protein 02/01/2023 8.2 (H)    Albumin 02/01/2023 5.0    Globulin 02/01/2023 3.2    AG Ratio 02/01/2023 1.6    Total Bilirubin 02/01/2023 0.9    Alkaline phosphatase (AP* 02/01/2023 149 (H)    AST 02/01/2023 107 (H)    ALT 02/01/2023 52 (H)    Hgb A1c MFr Bld 02/01/2023 5.1    Mean Plasma Glucose 02/01/2023 100    eAG (mmol/L) 02/01/2023 5.5    Office Visit on 08/29/2022  Component Date Value   High risk HPV 08/29/2022 Negative    Neisseria Gonorrhea 08/29/2022 Negative    Chlamydia 08/29/2022 Negative    Trichomonas 08/29/2022 Negative    Adequacy 08/29/2022 Satisfactory for evaluation; transformation zone component PRESENT.    Diagnosis 08/29/2022 - Negative for intraepithelial lesion or malignancy (NILM)    Microorganisms 08/29/2022 Shift in flora suggestive of bacterial vaginosis    Comment 08/29/2022 Normal Reference Range HPV - Negative    Comment 08/29/2022 Normal Reference Range Trichomonas - Negative    Comment 08/29/2022 Normal Reference Ranger Chlamydia - Negative    Comment 08/29/2022 Normal Reference Range Neisseria Gonorrhea - Negative    RPR Ser Ql 08/29/2022 NON-REACTIVE    HIV 1&2 Ab, 4th Generati* 08/29/2022 NON-REACTIVE    Hepatitis C Ab 08/29/2022 NON-REACTIVE   Office Visit on 08/03/2022  Component Date Value   SARS Coronavirus 2 Ag 08/03/2022 Negative    Influenza A, POC 08/03/2022 Negative    Influenza B, POC 08/03/2022 Negative    Rapid Strep A Screen 08/03/2022 Negative    Color, Urine 08/03/2022 ORANGE (A)    APPearance 08/03/2022 SL CLOUDY (A)    Specific Gravity, Urine 08/03/2022 1.015    pH 08/03/2022 8.0    Total Protein, Urine 08/03/2022 30 (A)    Urine Glucose 08/03/2022 NEGATIVE    Ketones, ur 08/03/2022 15 (A)    Bilirubin Urine 08/03/2022 SMALL (A)    Hgb urine dipstick 08/03/2022 NEGATIVE    Urobilinogen, UA 08/03/2022 2.0 (A)    Leukocytes,Ua 08/03/2022 NEGATIVE    Nitrite 08/03/2022 NEGATIVE    WBC, UA 08/03/2022 0-2/hpf    RBC / HPF 08/03/2022 0-2/hpf    Squamous Epithelial / HPF 08/03/2022 Many(>10/hpf) (A)    Bacteria, UA 08/03/2022 Few(10-50/hpf) (A)    Epithelial Casts, UA 08/03/2022 Presence of (A)    Lipase 08/03/2022 6 (L)    Glucose, Bld 08/03/2022 114 (H)    BUN 08/03/2022 4 (L)    Creat 08/03/2022 0.74    BUN/Creatinine Ratio 08/03/2022 5 (L)    Sodium 08/03/2022  135    Potassium 08/03/2022 3.4 (L)    Chloride 08/03/2022 97 (L)    CO2 08/03/2022 18 (L)    Calcium 08/03/2022 9.1    Total Protein 08/03/2022 7.9  Albumin 08/03/2022 4.5    Globulin 08/03/2022 3.4    AG Ratio 08/03/2022 1.3    Total Bilirubin 08/03/2022 0.7    Alkaline phosphatase (AP* 08/03/2022 69    AST 08/03/2022 21    ALT 08/03/2022 18    WBC 08/03/2022 12.4 (H)    RBC 08/03/2022 4.09    Hemoglobin 08/03/2022 13.7    HCT 08/03/2022 39.2    MCV 08/03/2022 95.8    MCH 08/03/2022 33.5 (H)    MCHC 08/03/2022 34.9    RDW 08/03/2022 15.6 (H)    Platelets 08/03/2022 285    MPV 08/03/2022 10.8    Amylase 08/03/2022 66   Admission on 06/26/2022, Discharged on 06/29/2022  Component Date Value   WBC 06/26/2022 7.0    RBC 06/26/2022 3.53 (L)    Hemoglobin 06/26/2022 12.8    HCT 06/26/2022 36.6    MCV 06/26/2022 103.7 (H)    MCH 06/26/2022 36.3 (H)    MCHC 06/26/2022 35.0    RDW 06/26/2022 17.6 (H)    Platelets 06/26/2022 203    nRBC 06/26/2022 0.6 (H)    Sodium 06/26/2022 136    Potassium 06/26/2022 2.9 (L)    Chloride 06/26/2022 94 (L)    CO2 06/26/2022 24    Glucose, Bld 06/26/2022 164 (H)    BUN 06/26/2022 6    Creatinine, Ser 06/26/2022 0.77    Calcium 06/26/2022 9.0    Total Protein 06/26/2022 7.3    Albumin 06/26/2022 4.3    AST 06/26/2022 65 (H)    ALT 06/26/2022 30    Alkaline Phosphatase 06/26/2022 86    Total Bilirubin 06/26/2022 1.5 (H)    GFR, Estimated 06/26/2022 >60    Anion gap 06/26/2022 18 (H)    Color, Urine 06/27/2022 AMBER (A)    APPearance 06/27/2022 HAZY (A)    Specific Gravity, Urine 06/27/2022 1.024    pH 06/27/2022 7.0    Glucose, UA 06/27/2022 NEGATIVE    Hgb urine dipstick 06/27/2022 NEGATIVE    Bilirubin Urine 06/27/2022 SMALL (A)    Ketones, ur 06/27/2022 20 (A)    Protein, ur 06/27/2022 100 (A)    Nitrite 06/27/2022 NEGATIVE    Leukocytes,Ua 06/27/2022 NEGATIVE    RBC / HPF 06/27/2022 0-5    WBC, UA 06/27/2022 0-5     Bacteria, UA 06/27/2022 NONE SEEN    Squamous Epithelial / HPF 06/27/2022 6-10    Mucus 06/27/2022 PRESENT    Hyaline Casts, UA 06/27/2022 PRESENT    Preg Test, Ur 06/27/2022 NEGATIVE    Alcohol, Ethyl (B) 06/26/2022 <10    Lactic Acid, Venous 06/26/2022 1.5    Opiates 06/27/2022 NONE DETECTED    Cocaine 06/27/2022 NONE DETECTED    Benzodiazepines 06/27/2022 POSITIVE (A)    Amphetamines 06/27/2022 NONE DETECTED    Tetrahydrocannabinol 06/27/2022 POSITIVE (A)    Barbiturates 06/27/2022 NONE DETECTED    HIV Screen 4th Generatio* 06/27/2022 Non Reactive    Magnesium 06/27/2022 1.5 (L)    Phosphorus 06/27/2022 3.2    WBC 06/27/2022 7.2    RBC 06/27/2022 3.27 (L)    Hemoglobin 06/27/2022 11.8 (L)    HCT 06/27/2022 34.0 (L)    MCV 06/27/2022 104.0 (H)    MCH 06/27/2022 36.1 (H)    MCHC 06/27/2022 34.7    RDW 06/27/2022 17.8 (H)    Platelets 06/27/2022 186    nRBC 06/27/2022 0.8 (H)    Sodium 06/27/2022 135    Potassium 06/27/2022 3.3 (L)    Chloride 06/27/2022 94 (L)  CO2 06/27/2022 26    Glucose, Bld 06/27/2022 118 (H)    BUN 06/27/2022 6    Creatinine, Ser 06/27/2022 0.77    Calcium 06/27/2022 8.7 (L)    Total Protein 06/27/2022 6.7    Albumin 06/27/2022 4.2    AST 06/27/2022 46 (H)    ALT 06/27/2022 25    Alkaline Phosphatase 06/27/2022 76    Total Bilirubin 06/27/2022 1.3 (H)    GFR, Estimated 06/27/2022 >60    Anion gap 06/27/2022 15    Sodium 06/28/2022 137    Potassium 06/28/2022 2.5 (LL)    Chloride 06/28/2022 99    CO2 06/28/2022 26    Glucose, Bld 06/28/2022 93    BUN 06/28/2022 <5 (L)    Creatinine, Ser 06/28/2022 0.71    Calcium 06/28/2022 9.2    Total Protein 06/28/2022 6.6    Albumin 06/28/2022 4.0    AST 06/28/2022 51 (H)    ALT 06/28/2022 26    Alkaline Phosphatase 06/28/2022 72    Total Bilirubin 06/28/2022 1.2    GFR, Estimated 06/28/2022 >60    Anion gap 06/28/2022 12    WBC 06/28/2022 5.8    RBC 06/28/2022 3.47 (L)    Hemoglobin 06/28/2022  12.6    HCT 06/28/2022 36.6    MCV 06/28/2022 105.5 (H)    MCH 06/28/2022 36.3 (H)    MCHC 06/28/2022 34.4    RDW 06/28/2022 18.1 (H)    Platelets 06/28/2022 133 (L)    nRBC 06/28/2022 0.7 (H)    Magnesium 06/28/2022 2.0    Sodium 06/28/2022 138    Potassium 06/28/2022 5.8 (H)    Chloride 06/28/2022 107    CO2 06/28/2022 19 (L)    Glucose, Bld 06/28/2022 99    BUN 06/28/2022 <5 (L)    Creatinine, Ser 06/28/2022 1.22 (H)    Calcium 06/28/2022 9.5    GFR, Estimated 06/28/2022 >60    Anion gap 06/28/2022 12    Sodium 06/29/2022 139    Potassium 06/29/2022 3.6    Chloride 06/29/2022 105    CO2 06/29/2022 22    Glucose, Bld 06/29/2022 88    BUN 06/29/2022 <5 (L)    Creatinine, Ser 06/29/2022 0.69    Calcium 06/29/2022 9.2    GFR, Estimated 06/29/2022 >60    Anion gap 06/29/2022 12    Magnesium 06/29/2022 1.7    TSH 06/29/2022 5.813 (H)   Office Visit on 06/01/2022  Component Date Value   Preg Test, Ur 06/01/2022 Negative    SARS Coronavirus 2 Ag 06/01/2022 Negative    Influenza A, POC 06/01/2022 Negative    Influenza B, POC 06/01/2022 Negative   Office Visit on 03/19/2022  Component Date Value   Sodium 03/19/2022 139    Potassium 03/19/2022 4.6    Chloride 03/19/2022 102    CO2 03/19/2022 22    Glucose, Bld 03/19/2022 81    BUN 03/19/2022 7    Creatinine, Ser 03/19/2022 0.83    Total Bilirubin 03/19/2022 0.5    Alkaline Phosphatase 03/19/2022 60    AST 03/19/2022 27    ALT 03/19/2022 14    Total Protein 03/19/2022 8.2    Albumin 03/19/2022 4.7    GFR 03/19/2022 98.05    Calcium 03/19/2022 9.9   Lab on 03/08/2022  Component Date Value   WBC 03/08/2022 6.5    RBC 03/08/2022 3.44 (L)    Hemoglobin 03/08/2022 12.0    HCT 03/08/2022 33.8 (L)    MCV 03/08/2022 98.3  MCH 03/08/2022 34.9 (H)    MCHC 03/08/2022 35.5    RDW 03/08/2022 22.9 (H)    Platelets 03/08/2022 323    MPV 03/08/2022 10.0    Neutro Abs 03/08/2022 4,069    Lymphs Abs 03/08/2022 2,035     Absolute Monocytes 03/08/2022 332    Eosinophils Absolute 03/08/2022 33    Basophils Absolute 03/08/2022 33    Neutrophils Relative % 03/08/2022 62.6    Total Lymphocyte 03/08/2022 31.3    Monocytes Relative 03/08/2022 5.1    Eosinophils Relative 03/08/2022 0.5    Basophils Relative 03/08/2022 0.5   Office Visit on 03/07/2022  Component Date Value   Sodium 03/07/2022 140    Potassium 03/07/2022 2.9 (L)    Chloride 03/07/2022 103    CO2 03/07/2022 24    Glucose, Bld 03/07/2022 98    BUN 03/07/2022 4 (L)    Creatinine, Ser 03/07/2022 0.75    Total Bilirubin 03/07/2022 0.3    Alkaline Phosphatase 03/07/2022 53    AST 03/07/2022 25    ALT 03/07/2022 15    Total Protein 03/07/2022 7.6    Albumin 03/07/2022 4.6    GFR 03/07/2022 110.76    Calcium 03/07/2022 8.7    WBC 03/07/2022 6.4    RBC 03/07/2022 3.42 (L)    Hemoglobin 03/07/2022 11.9 (L)    HCT 03/07/2022 35.5 (L)    MCV 03/07/2022 103.9 (H)    MCHC 03/07/2022 33.4    RDW 03/07/2022 26.1 (H)    Platelets 03/07/2022 308.0    Neutrophils Relative % 03/07/2022 61.6    Lymphocytes Relative 03/07/2022 30.6    Monocytes Relative 03/07/2022 5.4    Eosinophils Relative 03/07/2022 0.6    Basophils Relative 03/07/2022 1.8    Neutro Abs 03/07/2022 3.9    Lymphs Abs 03/07/2022 1.9    Monocytes Absolute 03/07/2022 0.3    Eosinophils Absolute 03/07/2022 0.0    Basophils Absolute 03/07/2022 0.1    Lyme Total Antibody EIA 03/07/2022 Negative    No image results found.   No results found.  No results found.    This encounter employed real-time, collaborative documentation. The patient actively reviewed and updated their medical record on a shared screen, ensuring transparency and facilitating joint problem-solving for the problem list, overview, and plan. This approach promotes accurate, informed care. The treatment plan was discussed and reviewed in detail, including medication safety, potential side effects, and all patient  questions. We confirmed understanding and comfort with the plan. Follow-up instructions were established, including contacting the office for any concerns, returning if symptoms worsen, persist, or new symptoms develop, and precautions for potential emergency department visits. ----------------------------------------------------- [name], MD  02/22/2023 8:31 PM  [facility]:  [phone]

## 2023-02-22 NOTE — Patient Instructions (Addendum)
[facility] [place] 4.4 (89)  Mental health clinic [address]  [phone]   VISIT SUMMARY:  During your visit, we discussed your concerns about changes in your menstrual cycle, persistent heartburn, depression, and auditory hallucinations. We also talked about your weight management and social circumstances, including your employment and housing situation.  YOUR PLAN:  -MENSTRUAL CHANGES: You've been experiencing unusually heavy and prolonged periods, which may be due to your recent restart of birth control. We'll continue with your current birth control regimen, and we expect your periods to improve with continued use.  -HEARTBURN: You've been dealing with persistent heartburn, a condition known as Gastroesophageal Reflux Disease (GERD). We'll refill your heartburn medication (omeprazole) at a lower dose and advise you to take your vitamins an hour before the omeprazole to help your body absorb them better.  -DEPRESSION: Your depression seems to be well-controlled with your current medication (Lexapro/Abilify/seroquel). We'll continue with the same dose, but we would like psychiatry input and will try to get  -AUDITORY HALLUCINATIONS: Your auditory hallucinations have improved with your current medication (aripiprazole). We'll continue with the same dose.  -WEIGHT MANAGEMENT: You've been having difficulty controlling your appetite and cravings for sweets. We'll increase your dose of topiramate to help with this.  -SOCIAL CIRCUMSTANCES: We discussed your current employment and housing situation. We're referring you to [facility] ([facility]) for assistance with these social determinants of health. They should reach out/call you to try to help assist financially.  INSTRUCTIONS:  Please follow up in 2-4 weeks, or sooner if needed, especially if your heartburn symptoms worsen with the reduced dose of omeprazole.

## 2023-02-22 NOTE — Assessment & Plan Note (Signed)
Encouraged patient to resume meetings

## 2023-02-22 NOTE — Assessment & Plan Note (Signed)
No tardive/hyperkinetic activity noted today Will try to get psychiatry again

## 2023-03-04 ENCOUNTER — Ambulatory Visit: Payer: Medicaid Other | Admitting: Internal Medicine

## 2023-03-04 ENCOUNTER — Encounter: Payer: Self-pay | Admitting: Internal Medicine

## 2023-03-04 VITALS — BP 141/101 | HR 81 | Temp 98.0°F | Ht 65.0 in | Wt 194.2 lb

## 2023-03-04 DIAGNOSIS — F1021 Alcohol dependence, in remission: Secondary | ICD-10-CM | POA: Diagnosis not present

## 2023-03-04 DIAGNOSIS — F4001 Agoraphobia with panic disorder: Secondary | ICD-10-CM

## 2023-03-04 DIAGNOSIS — Z789 Other specified health status: Secondary | ICD-10-CM

## 2023-03-04 DIAGNOSIS — G43909 Migraine, unspecified, not intractable, without status migrainosus: Secondary | ICD-10-CM | POA: Insufficient documentation

## 2023-03-04 DIAGNOSIS — R030 Elevated blood-pressure reading, without diagnosis of hypertension: Secondary | ICD-10-CM

## 2023-03-04 DIAGNOSIS — R110 Nausea: Secondary | ICD-10-CM | POA: Diagnosis not present

## 2023-03-04 DIAGNOSIS — G43809 Other migraine, not intractable, without status migrainosus: Secondary | ICD-10-CM

## 2023-03-04 DIAGNOSIS — F1094 Alcohol use, unspecified with alcohol-induced mood disorder: Secondary | ICD-10-CM

## 2023-03-04 DIAGNOSIS — Z599 Problem related to housing and economic circumstances, unspecified: Secondary | ICD-10-CM

## 2023-03-04 DIAGNOSIS — F331 Major depressive disorder, recurrent, moderate: Secondary | ICD-10-CM | POA: Diagnosis not present

## 2023-03-04 MED ORDER — ACAMPROSATE CALCIUM 333 MG PO TBEC
666.0000 mg | DELAYED_RELEASE_TABLET | Freq: Three times a day (TID) | ORAL | 3 refills | Status: DC
Start: 2023-03-04 — End: 2023-06-06

## 2023-03-04 MED ORDER — ESCITALOPRAM OXALATE 20 MG PO TABS
20.0000 mg | ORAL_TABLET | Freq: Every day | ORAL | 3 refills | Status: DC
Start: 2023-03-04 — End: 2023-12-25

## 2023-03-04 MED ORDER — ONDANSETRON HCL 4 MG PO TABS
4.0000 mg | ORAL_TABLET | Freq: Three times a day (TID) | ORAL | 2 refills | Status: DC | PRN
Start: 2023-03-04 — End: 2024-02-13

## 2023-03-04 NOTE — Patient Instructions (Signed)
VISIT SUMMARY:  During your visit, we discussed your current medication regimen and your recent experiences with depression, alcohol cravings, nausea, and migraines. You reported feeling better after accidentally doubling your dose of Lexapro, and we agreed to increase your Lexapro dosage to 20mg  daily. We also discussed renewing your prescription for Acamprosate to help manage alcohol cravings. We will continue your current treatments for nausea and migraines, and we will refer you to Medicaid chronic care management for assistance with medication management.  YOUR PLAN:  -DEPRESSION: We are increasing your Lexapro dosage to 20mg  daily to help manage your depression. We will discontinue Bupropion since you are also taking Ovelity. Please be aware of the risk of serotonin syndrome, which can occur when certain medications interact.  -ALCOHOL USE DISORDER: We are renewing your prescription for Acamprosate, which can help manage cravings for alcohol. Please take this medication as needed when you experience cravings.  -NAUSEA: We will continue your current treatment with Zofran for nausea. Please take this medication as needed when you experience nausea, and monitor for any heart palpitations.  -MIGRAINES: We will continue your current treatment with Rizatriptan (Maxalt) for migraines. Please take this medication as needed when you experience a migraine.  -MEDICATION ADHERENCE: We are referring you to Medicaid chronic care management for assistance with managing your medications. They can provide you with a pill box to help you remember to take your medications.  INSTRUCTIONS:  Please monitor your symptoms closely after increasing your Lexapro dosage, and contact us immediately if you experience any signs of serotonin syndrome, such as agitation, hallucinations, rapid heartbeat, or fever. We will follow up with you in 1-2 weeks to monitor your response to the increased Lexapro dose.  It was a  pleasure seeing you today! Your health and satisfaction are our top priorities.  Glenetta Hew, MD  Your Providers PCP: [name], MD,  [phone]) Referring Provider: [name], MD,  [phone]) Care Team Provider: Magdalene Patricia, MD     NEXT STEPS: [x]  Early Intervention: Schedule sooner appointment, call our on-call services, or go to emergency room if there is any significant Increase in pain or discomfort New or worsening symptoms Sudden or severe changes in your health [x]  Flexible Follow-Up: We recommend a No follow-ups on file. for optimal routine care. This allows for progress monitoring and treatment adjustments. [x]  Preventive Care: Schedule your annual preventive care visit! It's typically covered by insurance and helps identify potential health issues early. [x]  Lab & X-ray Appointments: Incomplete tests scheduled today, or call to schedule. X-rays: [facility] (M-F, 8:30am-noon or 1pm-5pm). [x]  Medical Information Release: Sign a release form at front desk to obtain relevant medical information we don't have.  MAKING THE MOST OF OUR FOCUSED 20 MINUTE APPOINTMENTS: [x]   Clearly state your top concerns at the beginning of the visit to focus our discussion [x]   If you anticipate you will need more time, please inform the front desk during scheduling - we can book multiple appointments in the same week. [x]   If you have transportation problems- use our convenient video appointments or ask about transportation support. [x]   We can get down to business faster if you use MyChart to update information before the visit and submit non-urgent questions before your visit. Thank you for taking the time to provide details through MyChart.  Let our nurse know and she can import this information into your encounter documents.  Arrival and Wait Times: [x]   Arriving on time ensures  that everyone receives prompt attention. [x]   Early morning (8a) and  afternoon (1p) appointments tend to have shortest wait times. [x]   Unfortunately, we cannot delay appointments for late arrivals or hold slots during phone calls.  Getting Answers and Following Up [x]   Simple Questions & Concerns: For quick questions or basic follow-up after your visit, reach [place] at [phone] or MyChart messaging. [x]   Complex Concerns: If your concern is more complex, scheduling an appointment might be best. Discuss this with the staff to find the most suitable option. [x]   Lab & Imaging Results: We'll contact you directly if results are abnormal or you don't use MyChart. Most normal results will be on MyChart within 2-3 business days, with a review message from Dr. Jon Billings. Haven't heard back in 2 weeks? Need results sooner? Contact us at [phone]. [x]   Referrals: Our referral coordinator will manage specialist referrals. The specialist's office should contact you within 2 weeks to schedule an appointment. Call us if you haven't heard from them after 2 weeks.  Staying Connected [x]   MyChart: Activate your MyChart for the fastest way to access results and message [place]. See the last page of this paperwork for instructions on how to activate.  Bring to Your Next Appointment [x]   Medications: Please bring all your medication bottles to your next appointment to ensure we have an accurate record of your prescriptions. [x]   Health Diaries: If you're monitoring any health conditions at home, keeping a diary of your readings can be very helpful for discussions at your next appointment.  Billing [x]   X-ray & Lab Orders: These are billed by separate companies. Contact the invoicing company directly for questions or concerns. [x]   Visit Charges: Discuss any billing inquiries with our administrative services team.  Your Satisfaction Matters [x]   Share Your Experience: We strive for your satisfaction! If you have any complaints, or preferably compliments, please let Dr. Jon Billings  know directly or contact our Practice Administrators, Edwena Felty or [employer], by asking at the front desk.   Reviewing Your Records [x]   Review this early draft of your clinical encounter notes below and the final encounter summary tomorrow on MyChart after its been completed.  All orders placed so far are visible here: Moderate episode of recurrent major depressive disorder (HCC) Assessment & Plan: She is currently on Lexapro 10 mg and Auvelity 45-105 mg and reported feeling better after unintentionally doubling the Lexapro dose. We discussed the risk of serotonin syndrome due to interaction with other medications, and she understands the risks and wishes to increase Lexapro. We will increase Lexapro to 20 mg daily and discontinue Bupropion since she is on Auvelity. We will closely monitor for signs of serotonin syndrome.  Orders: -     AMB Referral to Managed Medicaid Care Management  Elevated blood pressure reading -     AMB Referral to Managed Medicaid Care Management  Panic disorder with agoraphobia -     Escitalopram Oxalate; Take 1 tablet (20 mg total) by mouth daily.  Dispense: 90 tablet; Refill: 3 -     AMB Referral to Managed Medicaid Care Management  Alcohol use with alcohol-induced mood disorder (HCC) -     Acamprosate Calcium; Take 2 tablets (666 mg total) by mouth 3 (three) times daily with meals.  Dispense: 270 tablet; Refill: 3 -     AMB Referral to Managed Medicaid Care Management  Nausea -     Ondansetron HCl; Take 1 tablet (4 mg total) by mouth  every 8 (eight) hours as needed for nausea or vomiting.  Dispense: 30 tablet; Refill: 2 -     AMB Referral to Managed Medicaid Care Management  Financial difficulties -     AMB Referral to Managed Medicaid Care Management  Alcohol use disorder, severe, in early remission, dependence (HCC) -     AMB Referral to Managed Medicaid Care Management  Other migraine without status migrainosus, not intractable  Patient  understands importance of medication adherence

## 2023-03-04 NOTE — Assessment & Plan Note (Signed)
Has behavioral health planned soon.

## 2023-03-04 NOTE — Assessment & Plan Note (Signed)
Does better with more Lexapro, she thinks, will increase dose but stay off Cymbalta and monitor closely for serotonin syndrome.

## 2023-03-04 NOTE — Assessment & Plan Note (Signed)
Doing well in recovery.

## 2023-03-04 NOTE — Progress Notes (Signed)
[facility]: [phone]   Routine Medical Office Visit  Patient:  Natasha Pope      Age: 27 y.o.       Sex:  female  Date:   03/04/2023 Patient Care Team: [name], MD as PCP - General (Internal Medicine) Pucilowski, Roosvelt Maser, MD (Inactive) as Consulting Physician (Psychiatry) Today's Healthcare Provider: [name], MD   Assessment and Plan:       Assessment & Plan Elevated blood pressure reading Ignore, usually controlled  Panic disorder with agoraphobia Has behavioral health planned soon. Alcohol use with alcohol-induced mood disorder (HCC) Doing well in recovery Nausea Nausea She occasionally experiences nausea and is on Zofran for it. We discussed potential cardiac side effects with increased Lexapro. We will continue Zofran as needed for nausea and monitor for palpitations. Financial difficulties  Moderate episode of recurrent major depressive disorder (HCC) Does better with more Lexapro, she thinks, will increase dose but stay off Cymbalta and monitor closely for serotonin syndrome.  Alcohol use disorder, severe, in early remission, dependence (HCC)  Alcohol Use Disorder She reported a trigger to drink but did not consume alcohol and has stopped taking Acamprosate. We will renew Acamprosate for use during cravings. Other migraine without status migrainosus, not intractable Migraines She takes Rizatriptan (Maxalt) 5 mg as needed for migraines. We will continue Rizatriptan 5 mg as needed for migraines. Patient understands importance of medication adherence  Medication Adherence She reported forgetting to take medications due to the inconvenience of opening multiple bottles. We will refer her to Medicaid chronic care management for assistance with medication management, including provision of a pill box.     We will follow up in 1-2 weeks to monitor her response to the increased Lexapro dose. Orders          Ordered     escitalopram (LEXAPRO) 20 MG tablet  Daily        03/04/23 1603    acamprosate (CAMPRAL) 333 MG tablet  3 times daily with meals        03/04/23 1619    ondansetron (ZOFRAN) 4 MG tablet  Every 8 hours PRN        03/04/23 1619    AMB Referral to Managed Medicaid Care Management       Comments: polypharmacy, unstable housing, financial insecurity, difficulty with medication(s) adherence, needs pillboxes set up.   03/04/23 1619            Recommended follow up: No follow-ups on file.  Future Appointments  Date Time Provider Department Center  03/05/2023 12:30 PM [name], LCSW [facility] None  03/06/2023  3:00 PM Shaune Leeks [facility] None  03/20/2023  3:40 PM [name], MD [facility]           Clinical Presentation:    27 y.o. female who 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; Financial difficulties; Gastroenteritis; Contraceptive management; GERD (gastroesophageal reflux disease); Migraine; and Nausea on their problem list. Her reasons/main concerns/chief complaints for today's office visit are One week follow-up   AI-Extracted: Discussed the use of AI  scribe software for clinical note transcription with the patient, who gave verbal consent to proceed.  History of Present Illness   The patient, on a complex regimen of psychiatric medications, presented with a request to increase the dosage of Lexapro from 10 mg to 20 mg. She  reported feeling significantly better after accidentally doubling her dose of Lexapro, along with other medications, one day when she missed her regular dose. However, she was cautioned about the risk of serotonin syndrome due to her current medication regimen, which includes Auvelity, Trazodone, and Seroquel.  The patient also reported a recent episode of intense cravings for alcohol, which she managed by eating shrimp instead of drinking. She denied any recent alcohol consumption. She also reported running out of acamprosate, a medication used to manage alcohol cravings, about a month ago and agreed to a prescription renewal.  The patient also mentioned a recent episode of nausea, which she managed with Zofran. She reported no persistent nausea. She also reported a recent negative pregnancy test.  The patient also reported discontinuing gabapentin and naltrexone. She reported taking Maxalt for migraines, but the dosage was unclear.  The patient also reported living in a hotel and actively seeking employment. She reported an upcoming phone appointment with a therapist, which she was looking forward to.       She  has a past medical history of Anxiety, Asthma, Depression, Gastroenteritis (10/22/2022), History of alcohol withdrawal delirium (07/04/2022), History of seizure due to alcohol withdrawal (02/28/2021), Memory loss (04/03/2022), Nausea vomiting and diarrhea (06/27/2022), Nicotine dependence due to vaping tobacco product (01/31/2018), 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 (04/17/2022), PTSD (post-traumatic stress disorder) (03/19/2022), Pyelonephritis, Seizure-like activity (HCC), Seizures (HCC), Sepsis (HCC), Snoring (04/17/2022), Thrombocytopenia (HCC) (05/19/2018), Unwanted pregnancy (11/26/2022), and Withdrawal syndrome (HCC) (09/27/2022).  Problem overviews that were updated today: Problem  Migraine  Nausea  Recurrent Major Depressive Disorder (Hcc)      02/22/2023    3:30 PM 01/18/2023     8:30 AM 12/11/2022   12:17 PM  Depression screen PHQ 2/9  Decreased Interest 1 1 1   Down, Depressed, Hopeless 0 2 1  PHQ - 2 Score 1 3 2   Altered sleeping 1 2 1   Tired, decreased energy 1 1 1   Change in appetite 1 1 1   Feeling bad or failure about yourself  0 0 0  Trouble concentrating 0 0 0  Moving slowly or fidgety/restless 0 0 0  Suicidal thoughts 0 0 0  PHQ-9 Score 4 7 5   Difficult doing work/chores Not difficult at all Not difficult at all      >>OVERVIEW FOR DEPRESSION, MAJOR, SINGLE EPISODE, SEVERE (HCC) WRITTEN ON 10/04/2022  2:59 PM BY [name], MD         Current Outpatient Medications on File Prior to Visit  Medication Sig   busPIRone (BUSPAR) 15 MG tablet Take 1 tablet (15 mg total) by mouth 3 (three) times daily. Must be taken consistently.   Dextromethorphan-buPROPion ER (AUVELITY) 45-105 MG TBCR Take 1 tablet by mouth 2 (two) times daily. To start, take just one tablet once daily in the morning. After 3 days, increase to the maximum recommended dosage of one tablet twice daily, given at least 8 hours apart. Do not exceed two doses within the same day.   methocarbamol (ROBAXIN) 500 MG tablet Take 1 tablet (500 mg total) by mouth 4 (four) times daily.   Multiple Vitamin (MULTIVITAMIN WITH MINERALS) TABS tablet Take 1 tablet by mouth daily.   norethindrone-ethinyl estradiol-FE (  HAILEY FE 1/20) 1-20 MG-MCG tablet Take 1 tablet by mouth daily.   omeprazole (PRILOSEC) 20 MG capsule Take 2 capsules (40 mg total) by mouth daily.   ondansetron (ZOFRAN-ODT) 4 MG disintegrating tablet Take 1 tablet (4 mg total) by mouth every 8 (eight) hours as needed for nausea or vomiting.   propranolol (INDERAL) 20 MG tablet Take 1 tablet (20 mg total) by mouth 3 (three) times daily.   QUEtiapine (SEROQUEL) 100 MG tablet Take 1 tablet (100 mg total) by mouth 2 (two) times daily. Take as 50 mg in morning, 150 mg in evening.  Stop duloxetine and trazodone. Ok to take with elevate.    rizatriptan (MAXALT) 5 MG tablet Take 5 mg by mouth as needed for migraine. May repeat in 2 hours if needed   thiamine (VITAMIN B-1) 100 MG tablet Take 1 tablet (100 mg total) by mouth daily.   topiramate (TOPAMAX) 25 MG tablet Take 2 tablets (50 mg total) by mouth in the morning, at noon, and at bedtime.   traZODone (DESYREL) 100 MG tablet Take 100 mg by mouth at bedtime.   [DISCONTINUED] Dextromethorphan HBr 15 MG TABS Take 3 tablets by mouth daily at 6 (six) AM.   No current facility-administered medications on file prior to visit.   Medications Discontinued During This Encounter  Medication Reason   escitalopram (LEXAPRO) 10 MG tablet Not covered by the pt's insurance   naltrexone (DEPADE) 50 MG tablet Not covered by the pt's insurance   acamprosate (CAMPRAL) 333 MG tablet Reorder   ondansetron (ZOFRAN) 4 MG tablet Reorder         Clinical Data Analysis:   Physical Exam  BP (!) 141/101 (BP Location: Right Arm, Patient Position: Sitting)   Pulse 81   Temp 98 F (36.7 C) (Temporal)   Ht 5\' 5"  (1.651 m)   Wt 194 lb 3.2 oz (88.1 kg)   SpO2 96%   BMI 32.32 kg/m  Wt Readings from Last 10 Encounters:  03/04/23 194 lb 3.2 oz (88.1 kg)  02/22/23 195 lb 6.4 oz (88.6 kg)  02/01/23 186 lb (84.4 kg)  01/21/23 195 lb 12.8 oz (88.8 kg)  01/18/23 188 lb 6 oz (85.4 kg)  12/11/22 194 lb 6.4 oz (88.2 kg)  11/26/22 191 lb (86.6 kg)  11/23/22 189 lb (85.7 kg)  11/12/22 187 lb (84.8 kg)  10/22/22 181 lb 6.4 oz (82.3 kg)   Vital signs reviewed.  Nursing notes reviewed. Weight trend reviewed. Abnormalities and Problem-Specific physical exam findings:  truncal adiposity   General Appearance:  No acute distress appreciable.   Well-groomed, healthy-appearing female.  Well proportioned with no abnormal fat distribution.  Good muscle tone. Skin: Clear and well-hydrated. Pulmonary:  Normal work of breathing at rest, no respiratory distress apparent. SpO2: 96 %  Musculoskeletal: All extremities  are intact.  Neurological:  Awake, alert, oriented, and engaged.  No obvious focal neurological deficits or cognitive impairments.  Sensorium seems unclouded.   Speech is clear and coherent with logical content. Psychiatric:  Appropriate mood, pleasant and cooperative demeanor, thoughtful and engaged during the exam  Results Reviewed:     No results found for any visits on 03/04/23.  Office Visit on 02/01/2023  Component Date Value   Rapid Strep A Screen 02/01/2023 Negative    SARS Coronavirus 2 Ag 02/01/2023 Negative    Glucose, Bld 02/01/2023 99    BUN 02/01/2023 8    Creat 02/01/2023 0.99 (H)    BUN/Creatinine Ratio 02/01/2023 8  Sodium 02/01/2023 134 (L)    Potassium 02/01/2023 3.0 (L)    Chloride 02/01/2023 90 (L)    CO2 02/01/2023 24    Calcium 02/01/2023 9.6    Total Protein 02/01/2023 8.2 (H)    Albumin 02/01/2023 5.0    Globulin 02/01/2023 3.2    AG Ratio 02/01/2023 1.6    Total Bilirubin 02/01/2023 0.9    Alkaline phosphatase (AP* 02/01/2023 149 (H)    AST 02/01/2023 107 (H)    ALT 02/01/2023 52 (H)    Hgb A1c MFr Bld 02/01/2023 5.1    Mean Plasma Glucose 02/01/2023 100    eAG (mmol/L) 02/01/2023 5.5   Office Visit on 08/29/2022  Component Date Value   High risk HPV 08/29/2022 Negative    Neisseria Gonorrhea 08/29/2022 Negative    Chlamydia 08/29/2022 Negative    Trichomonas 08/29/2022 Negative    Adequacy 08/29/2022 Satisfactory for evaluation; transformation zone component PRESENT.    Diagnosis 08/29/2022 - Negative for intraepithelial lesion or malignancy (NILM)    Microorganisms 08/29/2022 Shift in flora suggestive of bacterial vaginosis    Comment 08/29/2022 Normal Reference Range HPV - Negative    Comment 08/29/2022 Normal Reference Range Trichomonas - Negative    Comment 08/29/2022 Normal Reference Ranger Chlamydia - Negative    Comment 08/29/2022 Normal Reference Range Neisseria Gonorrhea - Negative    RPR Ser Ql 08/29/2022 NON-REACTIVE    HIV 1&2  Ab, 4th Generati* 08/29/2022 NON-REACTIVE    Hepatitis C Ab 08/29/2022 NON-REACTIVE   Office Visit on 08/03/2022  Component Date Value   SARS Coronavirus 2 Ag 08/03/2022 Negative    Influenza A, POC 08/03/2022 Negative    Influenza B, POC 08/03/2022 Negative    Rapid Strep A Screen 08/03/2022 Negative    Color, Urine 08/03/2022 ORANGE (A)    APPearance 08/03/2022 SL CLOUDY (A)    Specific Gravity, Urine 08/03/2022 1.015    pH 08/03/2022 8.0    Total Protein, Urine 08/03/2022 30 (A)    Urine Glucose 08/03/2022 NEGATIVE    Ketones, ur 08/03/2022 15 (A)    Bilirubin Urine 08/03/2022 SMALL (A)    Hgb urine dipstick 08/03/2022 NEGATIVE    Urobilinogen, UA 08/03/2022 2.0 (A)    Leukocytes,Ua 08/03/2022 NEGATIVE    Nitrite 08/03/2022 NEGATIVE    WBC, UA 08/03/2022 0-2/hpf    RBC / HPF 08/03/2022 0-2/hpf    Squamous Epithelial / HPF 08/03/2022 Many(>10/hpf) (A)    Bacteria, UA 08/03/2022 Few(10-50/hpf) (A)    Epithelial Casts, UA 08/03/2022 Presence of (A)    Lipase 08/03/2022 6 (L)    Glucose, Bld 08/03/2022 114 (H)    BUN 08/03/2022 4 (L)    Creat 08/03/2022 0.74    BUN/Creatinine Ratio 08/03/2022 5 (L)    Sodium 08/03/2022 135    Potassium 08/03/2022 3.4 (L)    Chloride 08/03/2022 97 (L)    CO2 08/03/2022 18 (L)    Calcium 08/03/2022 9.1    Total Protein 08/03/2022 7.9    Albumin 08/03/2022 4.5    Globulin 08/03/2022 3.4    AG Ratio 08/03/2022 1.3    Total Bilirubin 08/03/2022 0.7    Alkaline phosphatase (AP* 08/03/2022 69    AST 08/03/2022 21    ALT 08/03/2022 18    WBC 08/03/2022 12.4 (H)    RBC 08/03/2022 4.09    Hemoglobin 08/03/2022 13.7    HCT 08/03/2022 39.2    MCV 08/03/2022 95.8    MCH 08/03/2022 33.5 (H)    MCHC 08/03/2022 34.9  RDW 08/03/2022 15.6 (H)    Platelets 08/03/2022 285    MPV 08/03/2022 10.8    Amylase 08/03/2022 66   Admission on 06/26/2022, Discharged on 06/29/2022  Component Date Value   WBC 06/26/2022 7.0    RBC 06/26/2022 3.53 (L)     Hemoglobin 06/26/2022 12.8    HCT 06/26/2022 36.6    MCV 06/26/2022 103.7 (H)    MCH 06/26/2022 36.3 (H)    MCHC 06/26/2022 35.0    RDW 06/26/2022 17.6 (H)    Platelets 06/26/2022 203    nRBC 06/26/2022 0.6 (H)    Sodium 06/26/2022 136    Potassium 06/26/2022 2.9 (L)    Chloride 06/26/2022 94 (L)    CO2 06/26/2022 24    Glucose, Bld 06/26/2022 164 (H)    BUN 06/26/2022 6    Creatinine, Ser 06/26/2022 0.77    Calcium 06/26/2022 9.0    Total Protein 06/26/2022 7.3    Albumin 06/26/2022 4.3    AST 06/26/2022 65 (H)    ALT 06/26/2022 30    Alkaline Phosphatase 06/26/2022 86    Total Bilirubin 06/26/2022 1.5 (H)    GFR, Estimated 06/26/2022 >60    Anion gap 06/26/2022 18 (H)    Color, Urine 06/27/2022 AMBER (A)    APPearance 06/27/2022 HAZY (A)    Specific Gravity, Urine 06/27/2022 1.024    pH 06/27/2022 7.0    Glucose, UA 06/27/2022 NEGATIVE    Hgb urine dipstick 06/27/2022 NEGATIVE    Bilirubin Urine 06/27/2022 SMALL (A)    Ketones, ur 06/27/2022 20 (A)    Protein, ur 06/27/2022 100 (A)    Nitrite 06/27/2022 NEGATIVE    Leukocytes,Ua 06/27/2022 NEGATIVE    RBC / HPF 06/27/2022 0-5    WBC, UA 06/27/2022 0-5    Bacteria, UA 06/27/2022 NONE SEEN    Squamous Epithelial / HPF 06/27/2022 6-10    Mucus 06/27/2022 PRESENT    Hyaline Casts, UA 06/27/2022 PRESENT    Preg Test, Ur 06/27/2022 NEGATIVE    Alcohol, Ethyl (B) 06/26/2022 <10    Lactic Acid, Venous 06/26/2022 1.5    Opiates 06/27/2022 NONE DETECTED    Cocaine 06/27/2022 NONE DETECTED    Benzodiazepines 06/27/2022 POSITIVE (A)    Amphetamines 06/27/2022 NONE DETECTED    Tetrahydrocannabinol 06/27/2022 POSITIVE (A)    Barbiturates 06/27/2022 NONE DETECTED    HIV Screen 4th Generatio* 06/27/2022 Non Reactive    Magnesium 06/27/2022 1.5 (L)    Phosphorus 06/27/2022 3.2    WBC 06/27/2022 7.2    RBC 06/27/2022 3.27 (L)    Hemoglobin 06/27/2022 11.8 (L)    HCT 06/27/2022 34.0 (L)    MCV 06/27/2022 104.0 (H)    MCH  06/27/2022 36.1 (H)    MCHC 06/27/2022 34.7    RDW 06/27/2022 17.8 (H)    Platelets 06/27/2022 186    nRBC 06/27/2022 0.8 (H)    Sodium 06/27/2022 135    Potassium 06/27/2022 3.3 (L)    Chloride 06/27/2022 94 (L)    CO2 06/27/2022 26    Glucose, Bld 06/27/2022 118 (H)    BUN 06/27/2022 6    Creatinine, Ser 06/27/2022 0.77    Calcium 06/27/2022 8.7 (L)    Total Protein 06/27/2022 6.7    Albumin 06/27/2022 4.2    AST 06/27/2022 46 (H)    ALT 06/27/2022 25    Alkaline Phosphatase 06/27/2022 76    Total Bilirubin 06/27/2022 1.3 (H)    GFR, Estimated 06/27/2022 >60    Anion gap 06/27/2022 15  Sodium 06/28/2022 137    Potassium 06/28/2022 2.5 (LL)    Chloride 06/28/2022 99    CO2 06/28/2022 26    Glucose, Bld 06/28/2022 93    BUN 06/28/2022 <5 (L)    Creatinine, Ser 06/28/2022 0.71    Calcium 06/28/2022 9.2    Total Protein 06/28/2022 6.6    Albumin 06/28/2022 4.0    AST 06/28/2022 51 (H)    ALT 06/28/2022 26    Alkaline Phosphatase 06/28/2022 72    Total Bilirubin 06/28/2022 1.2    GFR, Estimated 06/28/2022 >60    Anion gap 06/28/2022 12    WBC 06/28/2022 5.8    RBC 06/28/2022 3.47 (L)    Hemoglobin 06/28/2022 12.6    HCT 06/28/2022 36.6    MCV 06/28/2022 105.5 (H)    MCH 06/28/2022 36.3 (H)    MCHC 06/28/2022 34.4    RDW 06/28/2022 18.1 (H)    Platelets 06/28/2022 133 (L)    nRBC 06/28/2022 0.7 (H)    Magnesium 06/28/2022 2.0    Sodium 06/28/2022 138    Potassium 06/28/2022 5.8 (H)    Chloride 06/28/2022 107    CO2 06/28/2022 19 (L)    Glucose, Bld 06/28/2022 99    BUN 06/28/2022 <5 (L)    Creatinine, Ser 06/28/2022 1.22 (H)    Calcium 06/28/2022 9.5    GFR, Estimated 06/28/2022 >60    Anion gap 06/28/2022 12    Sodium 06/29/2022 139    Potassium 06/29/2022 3.6    Chloride 06/29/2022 105    CO2 06/29/2022 22    Glucose, Bld 06/29/2022 88    BUN 06/29/2022 <5 (L)    Creatinine, Ser 06/29/2022 0.69    Calcium 06/29/2022 9.2    GFR, Estimated 06/29/2022  >60    Anion gap 06/29/2022 12    Magnesium 06/29/2022 1.7    TSH 06/29/2022 5.813 (H)   Office Visit on 06/01/2022  Component Date Value   Preg Test, Ur 06/01/2022 Negative    SARS Coronavirus 2 Ag 06/01/2022 Negative    Influenza A, POC 06/01/2022 Negative    Influenza B, POC 06/01/2022 Negative   Office Visit on 03/19/2022  Component Date Value   Sodium 03/19/2022 139    Potassium 03/19/2022 4.6    Chloride 03/19/2022 102    CO2 03/19/2022 22    Glucose, Bld 03/19/2022 81    BUN 03/19/2022 7    Creatinine, Ser 03/19/2022 0.83    Total Bilirubin 03/19/2022 0.5    Alkaline Phosphatase 03/19/2022 60    AST 03/19/2022 27    ALT 03/19/2022 14    Total Protein 03/19/2022 8.2    Albumin 03/19/2022 4.7    GFR 03/19/2022 98.05    Calcium 03/19/2022 9.9   Lab on 03/08/2022  Component Date Value   WBC 03/08/2022 6.5    RBC 03/08/2022 3.44 (L)    Hemoglobin 03/08/2022 12.0    HCT 03/08/2022 33.8 (L)    MCV 03/08/2022 98.3    MCH 03/08/2022 34.9 (H)    MCHC 03/08/2022 35.5    RDW 03/08/2022 22.9 (H)    Platelets 03/08/2022 323    MPV 03/08/2022 10.0    Neutro Abs 03/08/2022 4,069    Lymphs Abs 03/08/2022 2,035    Absolute Monocytes 03/08/2022 332    Eosinophils Absolute 03/08/2022 33    Basophils Absolute 03/08/2022 33    Neutrophils Relative % 03/08/2022 62.6    Total Lymphocyte 03/08/2022 31.3    Monocytes Relative 03/08/2022 5.1  Eosinophils Relative 03/08/2022 0.5    Basophils Relative 03/08/2022 0.5   Office Visit on 03/07/2022  Component Date Value   Sodium 03/07/2022 140    Potassium 03/07/2022 2.9 (L)    Chloride 03/07/2022 103    CO2 03/07/2022 24    Glucose, Bld 03/07/2022 98    BUN 03/07/2022 4 (L)    Creatinine, Ser 03/07/2022 0.75    Total Bilirubin 03/07/2022 0.3    Alkaline Phosphatase 03/07/2022 53    AST 03/07/2022 25    ALT 03/07/2022 15    Total Protein 03/07/2022 7.6    Albumin 03/07/2022 4.6    GFR 03/07/2022 110.76    Calcium 03/07/2022  8.7    WBC 03/07/2022 6.4    RBC 03/07/2022 3.42 (L)    Hemoglobin 03/07/2022 11.9 (L)    HCT 03/07/2022 35.5 (L)    MCV 03/07/2022 103.9 (H)    MCHC 03/07/2022 33.4    RDW 03/07/2022 26.1 (H)    Platelets 03/07/2022 308.0    Neutrophils Relative % 03/07/2022 61.6    Lymphocytes Relative 03/07/2022 30.6    Monocytes Relative 03/07/2022 5.4    Eosinophils Relative 03/07/2022 0.6    Basophils Relative 03/07/2022 1.8    Neutro Abs 03/07/2022 3.9    Lymphs Abs 03/07/2022 1.9    Monocytes Absolute 03/07/2022 0.3    Eosinophils Absolute 03/07/2022 0.0    Basophils Absolute 03/07/2022 0.1    Lyme Total Antibody EIA 03/07/2022 Negative    No image results found.   No results found.  No results found.    This encounter employed real-time, collaborative documentation. The patient actively reviewed and updated their medical record on a shared screen, ensuring transparency and facilitating joint problem-solving for the problem list, overview, and plan. This approach promotes accurate, informed care. The treatment plan was discussed and reviewed in detail, including medication safety, potential side effects, and all patient questions. We confirmed understanding and comfort with the plan. Follow-up instructions were established, including contacting the office for any concerns, returning if symptoms worsen, persist, or new symptoms develop, and precautions for potential emergency department visits. ----------------------------------------------------- [name], MD  03/04/2023 8:58 PM  [facility]:  [phone]

## 2023-03-04 NOTE — Assessment & Plan Note (Signed)
  Alcohol Use Disorder She reported a trigger to drink but did not consume alcohol and has stopped taking Acamprosate. We will renew Acamprosate for use during cravings.

## 2023-03-04 NOTE — Assessment & Plan Note (Signed)
Migraines She takes Rizatriptan (Maxalt) 5 mg as needed for migraines. We will continue Rizatriptan 5 mg as needed for migraines.

## 2023-03-04 NOTE — Assessment & Plan Note (Signed)
Nausea She occasionally experiences nausea and is on Zofran for it. We discussed potential cardiac side effects with increased Lexapro. We will continue Zofran as needed for nausea and monitor for palpitations.

## 2023-03-04 NOTE — Assessment & Plan Note (Signed)
She is currently on Lexapro 10 mg and Auvelity 45-105 mg and reported feeling better after unintentionally doubling the Lexapro dose. We discussed the risk of serotonin syndrome due to interaction with other medications, and she understands the risks and wishes to increase Lexapro. We will increase Lexapro to 20 mg daily and discontinue Bupropion since she is on Auvelity. We will closely monitor for signs of serotonin syndrome.

## 2023-03-05 ENCOUNTER — Other Ambulatory Visit: Payer: Medicaid Other | Admitting: Licensed Clinical Social Worker

## 2023-03-05 NOTE — Patient Instructions (Signed)
Visit Information  Natasha Pope was given information about Medicaid Managed Care team care coordination services as a part of their [employer] Medicaid benefit. Natasha Pope verbally consentedto engagement with the [facility] team.   If you are experiencing a medical emergency, please call 911 or report to your local emergency department or urgent care.   If you have a non-emergency medical problem during routine business hours, please contact your provider's office and ask to speak with a nurse.   For questions related to your [facility] health plan, please call: [phone]  OR visit the member homepage at: [email or web address]  If you would like to schedule transportation through your [facility] plan, please call the following number at least 2 days in advance of your appointment: [phone]  If you are experiencing a behavioral health crisis, call the [facility] Crisis Line at [phone]). The line is available 24 hours a day, seven days a week.  If you would like help to quit smoking, call 1-800-QUIT-NOW (506-733-4615) OR Espaol: 1-855-Djelo-Ya (6-644-034-7425) o para ms informacin haga clic aqu or Text READY to 956-387 to register via text  Following is a copy of your plan of care:  Care Plan : LCSW Plan of Care  Updates made by [name], LCSW since 03/05/2023 12:00 AM     Problem: Anxiety Identification (Anxiety)      Goal: Anxiety Symptoms Identified   Note:   Timeframe:  Short-Range Goal Priority:  High Start Date:   03/05/23             Expected End Date:  ongoing                     Follow Up Date--03/19/23 at 1 pm  - keep 90 percent of scheduled appointments -consider counseling or psychiatry -consider bumping up your self-care  -consider creating a stronger support network   Why is  this important?             Combatting depression may take some time.            If you don't feel better right away, don't give up on your treatment plan.    Current barriers:   Chronic Mental Health needs related to PTSD, alcohol-use with alcohol induced mood disorder, stress and anxiety. Patient requires Support, Education, Resources, Referrals, Advocacy, and Care Coordination, in order to meet Unmet Mental Health Needs. (To Find a Psychiatrist, pt unsure is she wants therapy) Patient will implement clinical interventions discussed today to decrease symptoms of depression and increase knowledge and/or ability of: coping skills. Mental Health Concerns and Social Isolation Patient lacks knowledge of available community counseling agencies and resources.  Clinical Goal(s): verbalize understanding of plan for management of Anxiety, Alcohol Use Disorder, Depression, and Stress and demonstrate a reduction in symptoms. Patient will connect with a provider for ongoing mental health treatment, increase coping skills, healthy habits, self-management skills, and stress reduction       Patient Goals/Self-Care Activities: Take medications as prescribed   Attend all scheduled provider appointments Call pharmacy for medication refills 3-7 days in advance of running out of medications Perform all self care activities independently  Perform IADL's (shopping, preparing meals, housekeeping, managing finances) independently Call provider office for new concerns or questions Work with the social worker to address care coordination needs and will continue to work with the clinical team to address health care and disease management related  needs call 1-800-273-TALK (toll free, 24 hour hotline) go to [facility] [address], [place] [phone]) call 911 if experiencing a Mental Health or Behavioral Health Crisis  Utilize healthy coping skills and supportive resources  discussed Contact PCP with any questions or concerns Keep 90 percent of counseling appointments Call your insurance provider for more information about your Enhanced Benefits  Check out counseling resources provided  Begin personal counseling with LCSW, to reduce and manage symptoms of Depression and Stress, until well-established with mental health provider Accept all calls from representative with [facility] in an effort to establish ongoing mental health counseling and supportive services. Incorporate into daily practice - relaxation techniques, deep breathing exercises, and mindfulness meditation strategies. Talk about feelings with friends, family members, spiritual advisor, etc. Contact LCSW directly [phone]), if you have questions, need assistance, or if additional social work needs are identified between now and our next scheduled telephone outreach call. Call 988 for mental health hotline/crisis line if needed (24/7 available) Try techniques to reduce symptoms of anxiety/negative thinking (deep breathing, distraction, positive self talk, etc)  - develop a personal safety plan - develop a plan to deal with triggers like holidays, anniversaries - exercise at least 2 to 3 times per week - have a plan for how to handle bad days - journal feelings and what helps to feel better or worse - spend time or talk with others at least 2 to 3 times per week - watch for early signs of feeling worse - begin personal counseling - call and visit an old friend - check out volunteer opportunities - join a support group - laugh; watch a funny movie or comedian - learn and use visualization or guided imagery - perform a random act of kindness - practice relaxation or meditation daily - start or continue a personal journal - practice positive thinking and self-talk -continue with compliance of taking medication  -identify current effective and ineffective coping strategies.  -implement positive  self-talk in care to increase self-esteem, confidence and feelings of control.  -consider alternative and complementary therapy approaches such as meditation, mindfulness or yoga.  Call your insurance provider to gain education on benefits if desired Call your primary care doctor if symptoms get worse  -journaling, prayer, worship services, meditation or pastoral counseling.  -increase participation in pleasurable group activities such as hobbies, singing, sports or volunteering).  -consider the use of meditative movement therapy such as tai chi, yoga or qigong.  -start a regular daily exercise program based on tolerance, ability and patient choice to support positive thinking and activity    Follow Up Plan:  The patient has been provided with contact information for the care management team and has been advised to call with any mental health or health related questions or concerns.  The care management team will reach out to the patient again over the next 30 business  days.   If you are experiencing a Mental Health or Behavioral Health Crisis or need someone to talk to, please call the Suicide and Crisis Lifeline: 988    Patient Goals: Initial goal     24- Hour Availability:    [facility]  [address] [place], [place] [phone] Crisis [phone]   [facility] [phone]  [place] [facility]  [phone]    [facility]  [phone] (after hours)   Therapeutic Alternative/Mobile Crisis   [phone]   [place] National  Suicide Hotline  (417) 635-2742 (TALK) OR 988   Call 988 for mental health emergencies   [facility]  [phone]);  [place] and [employer]  [phone]); [place], [place], [place], [place], [place], [place], [place]    [facility] for [facility] Residents For 24/7 walk-up access to mental health  services for [facility] children (4+), adolescents and adults, please visit the [facility] located at [address] in [place], [place].  *[facility] also provides comprehensive outpatient behavioral health services in a variety of locations around the [place].  Connect With [place] [address] [place], [place] [address] HelpLine: 734-742-7846 or 1-443-134-5871  Get Directions  Find Help 24/7 By Phone Call our 24-hour HelpLine at 316 405 4671 or 403-185-2819 for immediate assistance for mental health and substance abuse issues.  Walk-In Help [place]: [facility] (Ages 4 and Up)  [place]: Emergency Dept., [facility] Additional Resources National Hopeline Network: 1-800-SUICIDE The National Suicide Prevention Lifeline: 1-800-273-TALK      Dickie La, BSW, MSW, LCSW Managed Medicaid LCSW [facility]  [facility] [email or web address] Phone: [phone]

## 2023-03-05 NOTE — Patient Outreach (Signed)
Medicaid Managed Care Social Work Note  03/05/2023 Name:  Natasha Pope MRN:  [id number] DOB:  [birth year]  Natasha Pope is an 27 y.o. year old female who is a primary patient of [name], MD.  The Medicaid Managed Care Coordination team was consulted for assistance with:  Mental Health Counseling and Resources  Ms. Stieber was given information about Medicaid Managed Care Coordination team services today. [name] Patient agreed to services and verbal consent obtained.  Engaged with patient  for by telephone forinitial visit in response to referral for case management and/or care coordination services.   Assessments/Interventions:  Review of past medical history, allergies, medications, health status, including review of consultants reports, laboratory and other test data, was performed as part of comprehensive evaluation and provision of chronic care management services.  SDOH: (Social Determinant of Health) assessments and interventions performed: SDOH Interventions    Flowsheet Row Patient Outreach Telephone from 03/05/2023 in [place] [facility] Office Visit from 01/18/2023 in [name] Video Visit from 02/13/2021 in [facility] ED from 01/10/2020 in [facility] at [facility]  SDOH Interventions      Depression Interventions/Treatment  -- Currently on Treatment Referral to Psychiatry, Medication, Counseling, Currently on Treatment Referral to Psychiatry  Stress Interventions Offered [employer] Resources, Provide Counseling -- -- --       Advanced Directives Status:  See Care Plan for related entries.  Care Plan                 Allergies  Allergen Reactions   Lamotrigine Hives    Medications Reviewed Today   Medications were not reviewed in this encounter     Patient Active Problem List   Diagnosis Date  Noted   Migraine 03/04/2023   Nausea 03/04/2023   Contraceptive management 01/21/2023   GERD (gastroesophageal reflux disease) 01/21/2023   Gastroenteritis 10/22/2022   Financial difficulties 10/04/2022   Snoring 04/17/2022   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 04/17/2022   Memory loss 04/03/2022   PTSD (post-traumatic stress disorder) 03/19/2022   Auditory hallucinations 01/24/2021   Visual hallucination 01/24/2021   Chronic abdominal pain 01/24/2021   Delusional disorder (HCC)    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/2019   Alcohol use disorder, severe, in early remission, dependence (HCC) 12/27/2018   Recurrent major depressive disorder (HCC) 12/27/2018   Alcohol use with alcohol-induced mood disorder (HCC)    Substance induced mood disorder (HCC) 12/09/2018   Macrocytosis 05/19/2018   ASCUS of cervix with negative high risk HPV 03/07/2018   Nicotine dependence due to vaping tobacco product 01/31/2018   Asthma 01/31/2018   GAD (generalized anxiety disorder) 01/31/2018    Conditions to be addressed/monitored per PCP order:  Anxiety  Care Plan : LCSW Plan of Care  Updates made by [name], LCSW since 03/05/2023 12:00 AM     Problem: Anxiety Identification (Anxiety)      Goal: Anxiety Symptoms Identified   Note:   Timeframe:  Short-Range Goal Priority:  High Start Date:   03/05/23             Expected End Date:  ongoing                     Follow Up Date--03/19/23 at 1 pm  - keep 90 percent of scheduled appointments -consider counseling or psychiatry -consider bumping up your self-care  -consider creating a stronger support  network   Why is this important?             Combatting depression may take some time.            If you don't feel better right away, don't give up on your treatment plan.    Current barriers:   Chronic Mental Health needs related to PTSD, alcohol-use with alcohol induced mood disorder, stress and anxiety. Patient requires Support,  Education, Resources, Referrals, Advocacy, and Care Coordination, in order to meet Unmet Mental Health Needs. (To Find a Psychiatrist, pt unsure is she wants therapy) Patient will implement clinical interventions discussed today to decrease symptoms of depression and increase knowledge and/or ability of: coping skills. Mental Health Concerns and Social Isolation Patient lacks knowledge of available community counseling agencies and resources.  Clinical Goal(s): verbalize understanding of plan for management of Anxiety, Alcohol Use Disorder, Depression, and Stress and demonstrate a reduction in symptoms. Patient will connect with a provider for ongoing mental health treatment, increase coping skills, healthy habits, self-management skills, and stress reduction        Clinical Interventions:  Assessed patient's previous and current treatment, coping skills, support system and barriers to care. Patient provided hx. Patient's main support is her grandmother who lives in [place] and comes to visit her and physically check on her in the home every 2-3 weeks.  Verbalization of feelings encouraged, motivational interviewing employed Emotional support provided, positive coping strategies explored. Establishing healthy boundaries emphasized and healthy self-care education provided Patient was educated on available mental health resources within their area that accept Medicaid and offer psychiatry. Patient is unsure if she wishes to get therapy at this time.  Patient educated on the difference between therapy and psychiatry per patient request Email sent to patient today with available mental health resources within her area that accept Medicaid and offer the services that she is interested in. Email included instructions for scheduling at [facility] as well as some crisis support resources and [facility]'s walk in clinic hours. Patient will review resources over the next two weeks and make a decision regarding where she wishes  to gain MH treatment at. Emotional support provided. CBT intervention implemented regarding "being mentally fit" by combating negative thinking and replacing it with uplifting support, hope and positivity. Patient reports that her grandmother feels that her current psychiatric medications need adjusting.  Patient admits to ongoing alcohol usage and was educated on the mental and physical consequences of this negative coping mechanism. Assessed social determinant of health barriers Patient admits to PTSD. LCSW provided education on relaxation techniques such as meditation, deep breathing, massage, grounding exercises or yoga that can activate the body's relaxation response and ease symptoms of stress and anxiety. LCSW ask that when pt is struggling with difficult emotions and racing thoughts that they start this relaxation response process. LCSW provided extensive education on healthy coping skills for anxiety. SW used active and reflective listening, validated patient's feelings/concerns, and provided emotional support. Patient will work on implementing appropriate self-care habits into their daily routine such as: staying positive, writing a gratitude list, drinking water, staying active around the house, taking their medications as prescribed, combating negative thoughts or emotions and staying connected with their family and friends. Positive reinforcement provided for this decision to work on this.  Motivational Interviewing employed Depression screen reviewed  PHQ2/ PHQ9 completed or reviewed  Mindfulness or Relaxation training provided Active listening / Reflection utilized  Advance Care and HCPOA education provided Emotional Support Provided Problem Solving /  Task Center strategies reviewed Provided psychoeducation for mental health needs  Provided brief CBT  Reviewed mental health medications and discussed importance of compliance:  Quality of sleep assessed & Sleep Hygiene techniques  promoted  Participation in counseling encouraged  Verbalization of feelings encouraged  Suicidal Ideation/Homicidal Ideation assessed: Patient denies SI/HI  Review resources, discussed options and provided patient information about  Mental Health Resources Inter-disciplinary care team collaboration (see longitudinal plan of care)  Patient Goals/Self-Care Activities: Take medications as prescribed   Attend all scheduled provider appointments Call pharmacy for medication refills 3-7 days in advance of running out of medications Perform all self care activities independently  Perform IADL's (shopping, preparing meals, housekeeping, managing finances) independently Call provider office for new concerns or questions Work with the social worker to address care coordination needs and will continue to work with the clinical team to address health care and disease management related needs call 1-800-273-TALK (toll free, 24 hour hotline) go to [facility] [address] [place] [phone]) call 911 if experiencing a Mental Health or Behavioral Health Crisis  Utilize healthy coping skills and supportive resources discussed Contact PCP with any questions or concerns Keep 90 percent of counseling appointments Call your insurance provider for more information about your Enhanced Benefits  Check out counseling resources provided  Begin personal counseling with LCSW, to reduce and manage symptoms of Depression and Stress, until well-established with mental health provider Accept all calls from representative with [facility] in an effort to establish ongoing mental health counseling and supportive services. Incorporate into daily practice - relaxation techniques, deep breathing exercises, and mindfulness meditation strategies. Talk about feelings with friends, family members, spiritual advisor, etc. Contact LCSW directly [phone]), if you have questions, need  assistance, or if additional social work needs are identified between now and our next scheduled telephone outreach call. Call 988 for mental health hotline/crisis line if needed (24/7 available) Try techniques to reduce symptoms of anxiety/negative thinking (deep breathing, distraction, positive self talk, etc)  - develop a personal safety plan - develop a plan to deal with triggers like holidays, anniversaries - exercise at least 2 to 3 times per week - have a plan for how to handle bad days - journal feelings and what helps to feel better or worse - spend time or talk with others at least 2 to 3 times per week - watch for early signs of feeling worse - begin personal counseling - call and visit an old friend - check out volunteer opportunities - join a support group - laugh; watch a funny movie or comedian - learn and use visualization or guided imagery - perform a random act of kindness - practice relaxation or meditation daily - start or continue a personal journal - practice positive thinking and self-talk -continue with compliance of taking medication  -identify current effective and ineffective coping strategies.  -implement positive self-talk in care to increase self-esteem, confidence and feelings of control.  -consider alternative and complementary therapy approaches such as meditation, mindfulness or yoga.  Call your insurance provider to gain education on benefits if desired Call your primary care doctor if symptoms get worse  -journaling, prayer, worship services, meditation or pastoral counseling.  -increase participation in pleasurable group activities such as hobbies, singing, sports or volunteering).  -consider the use of meditative movement therapy such as tai chi, yoga or qigong.  -start a regular daily exercise program based on tolerance, ability and patient choice to support positive thinking and activity  Follow Up Plan:  The patient has been provided with  contact information for the care management team and has been advised to call with any mental health or health related questions or concerns.  The care management team will reach out to the patient again over the next 30 business  days.   If you are experiencing a Mental Health or Behavioral Health Crisis or need someone to talk to, please call the Suicide and Crisis Lifeline: 988    Patient Goals: Initial goal         03/05/2023   11:17 AM 02/22/2023    3:30 PM 01/18/2023    8:30 AM 12/11/2022   12:17 PM 10/03/2022    2:41 PM  Depression screen PHQ 2/9  Decreased Interest 1 1 1 1 1   Down, Depressed, Hopeless 0 0 2 1 1   PHQ - 2 Score 1 1 3 2 2   Altered sleeping 1 1 2 1 1   Tired, decreased energy 1 1 1 1 1   Change in appetite  1 1 1 1   Feeling bad or failure about yourself  1 0 0 0 1  Trouble concentrating 0 0 0 0 0  Moving slowly or fidgety/restless 0 0 0 0 0  Suicidal thoughts 0 0 0 0 0  PHQ-9 Score 4 4 7 5 6   Difficult doing work/chores Not difficult at all Not difficult at all Not difficult at all  Not difficult at all      02/22/2023    3:32 PM 01/18/2023    8:31 AM 10/03/2022    2:41 PM 09/26/2022   11:48 AM  GAD 7 : Generalized Anxiety Score  Nervous, Anxious, on Edge 0 2 1 1   Control/stop worrying 0 1 0 1  Worry too much - different things 1 1 2 1   Trouble relaxing 0 1 1 1   Restless 1 1 1 1   Easily annoyed or irritable 1 1 1 1   Afraid - awful might happen 0 0 0 1  Total GAD 7 Score 3 7 6 7   Anxiety Difficulty Not difficult at all Not difficult at all Not difficult at all Extremely difficult    Follow up:  Patient agrees to Care Plan and Follow-up.  Plan: The Managed Medicaid care management team will reach out to the patient again over the next 30 days.  Dickie La, BSW, MSW, [employer] Managed Medicaid LCSW [facility]  [facility] [email or web address] Phone: [phone]

## 2023-03-06 ENCOUNTER — Other Ambulatory Visit: Payer: Medicaid Other

## 2023-03-06 NOTE — Patient Instructions (Signed)
Visit Information  Ms. Hildenbrand was given information about Medicaid Managed Care team care coordination services as a part of their [employer] Medicaid benefit. [name] Batie verbally consentedto engagement with the [facility] team.   If you are experiencing a medical emergency, please call 911 or report to your local emergency department or urgent care.   If you have a non-emergency medical problem during routine business hours, please contact your provider's office and ask to speak with a nurse.   For questions related to your [facility] health plan, please call: [phone]  OR visit the member homepage at: [email or web address]  If you would like to schedule transportation through your [facility] plan, please call the following number at least 2 days in advance of your appointment: [phone]  If you are experiencing a behavioral health crisis, call the [facility] Crisis Line at [phone]). The line is available 24 hours a day, seven days a week.  If you would like help to quit smoking, call 1-800-QUIT-NOW (856 666 3083) OR Espaol: 1-855-Djelo-Ya (6-644-034-7425) o para ms informacin haga clic aqu or Text READY to 956-387 to register via text   Ms. [place] - following are the goals we discussed in your visit today:   Goals Addressed   None       Social Worker will follow up on 04/09/23.   Gus Puma, Kenard Gower, MHA [facility]  Managed Medicaid Social Worker [phone]   Following is a copy of your plan of care:  There are no care plans that you recently modified to display for this patient.

## 2023-03-06 NOTE — Patient Outreach (Signed)
Medicaid Managed Care Social Work Note  03/06/2023 Name:  Natasha Pope MRN:  [id number] DOB:  1996-05-25  Natasha Pope is an 27 y.o. year old female who is a primary patient of [name], MD.  The Medicaid Managed Care Coordination team was consulted for assistance with:  Community Resources   Ms. Khamis was given information about Medicaid Managed Care Coordination team services today. [name] Patient agreed to services and verbal consent obtained.  Engaged with patient  for by telephone forinitial visit in response to referral for case management and/or care coordination services.   Assessments/Interventions:  Review of past medical history, allergies, medications, health status, including review of consultants reports, laboratory and other test data, was performed as part of comprehensive evaluation and provision of chronic care management services.  SDOH: (Social Determinant of Health) assessments and interventions performed: SDOH Interventions    Flowsheet Row Patient Outreach Telephone from 03/05/2023 in [place] [facility] Office Visit from 01/18/2023 in [facility] Video Visit from 02/13/2021 in [facility] ED from 01/10/2020 in [facility] Emergency Department at [facility]  SDOH Interventions      Depression Interventions/Treatment  -- Currently on Treatment Referral to Psychiatry, Medication, Counseling, Currently on Treatment Referral to Psychiatry  Stress Interventions Offered [employer] Resources, Provide Counseling -- -- --     BSW completed a telephone outreach with patient. She states she needs resources for rent assistance and transportation to her medial appointments. Patient has applied for foodstamps but they just recently stopped due to her not volunteering. BSW will email resources for rent assistance and transportation to  [email or web address]  Advanced Directives Status:  Not addressed in this encounter.  Care Plan                 Allergies  Allergen Reactions   Lamotrigine Hives    Medications Reviewed Today   Medications were not reviewed in this encounter     Patient Active Problem List   Diagnosis Date Noted   Migraine 03/04/2023   Nausea 03/04/2023   Contraceptive management 01/21/2023   GERD (gastroesophageal reflux disease) 01/21/2023   Gastroenteritis 10/22/2022   Financial difficulties 10/04/2022   Snoring 04/17/2022   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 04/17/2022   Memory loss 04/03/2022   PTSD (post-traumatic stress disorder) 03/19/2022   Auditory hallucinations 01/24/2021   Visual hallucination 01/24/2021   Chronic abdominal pain 01/24/2021   Delusional disorder (HCC)    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/2019   Alcohol use disorder, severe, in early remission, dependence (HCC) 12/27/2018   Recurrent major depressive disorder (HCC) 12/27/2018   Alcohol use with alcohol-induced mood disorder (HCC)    Substance induced mood disorder (HCC) 12/09/2018   Macrocytosis 05/19/2018   ASCUS of cervix with negative high risk HPV 03/07/2018   Nicotine dependence due to vaping tobacco product 01/31/2018   Asthma 01/31/2018   GAD (generalized anxiety disorder) 01/31/2018    Conditions to be addressed/monitored per PCP order:   community resources  There are no care plans that you recently modified to display for this patient.   Follow up:  Patient agrees to Care Plan and Follow-up.  Plan: The Managed Medicaid care management team will reach out to the patient again over the next 30 days.  Date/time of next scheduled Social Work care management/care coordination outreach:  04/09/23  Gus Puma, Kenard Gower, [facility]  Managed [facility] Social Worker [phone]

## 2023-03-19 ENCOUNTER — Other Ambulatory Visit: Payer: Medicaid Other | Admitting: Licensed Clinical Social Worker

## 2023-03-19 NOTE — Patient Instructions (Signed)
Visit Information  Natasha Pope was given information about Medicaid Managed Care team care coordination services as a part of their [employer] Medicaid benefit. Natasha Pope verbally consentedto engagement with the [facility] team.   If you are experiencing a medical emergency, please call 911 or report to your local emergency department or urgent care.   If you have a non-emergency medical problem during routine business hours, please contact your provider's office and ask to speak with a nurse.   For questions related to your [facility] health plan, please call: [phone]  OR visit the member homepage at: [email or web address]  If you would like to schedule transportation through your [facility] plan, please call the following number at least 2 days in advance of your appointment: [phone]  If you are experiencing a behavioral health crisis, call the [facility] Crisis Line at [phone]). The line is available 24 hours a day, seven days a week.  If you would like help to quit smoking, call 1-800-QUIT-NOW (778-396-7144) OR Espaol: 1-855-Djelo-Ya (0-347-425-9563) o para ms informacin haga clic aqu or Text READY to 875-643 to register via text   Following is a copy of your plan of care:  Care Plan : LCSW Plan of Care  Updates made by [name], LCSW since 03/19/2023 12:00 AM     Problem: Anxiety Identification (Anxiety)      Goal: Anxiety Symptoms Identified   Note:   Timeframe:  Short-Range Goal Priority:  High Start Date:   03/05/23             Expected End Date:  ongoing                     Follow Up Date--04/04/23 at 1 pm  - keep 90 percent of scheduled appointments -consider counseling or psychiatry -consider bumping up your self-care  -consider creating a stronger support network   Why  is this important?             Combatting depression may take some time.            If you don't feel better right away, don't give up on your treatment plan.    Current barriers:   Chronic Mental Health needs related to PTSD, alcohol-use with alcohol induced mood disorder, stress and anxiety. Patient requires Support, Education, Resources, Referrals, Advocacy, and Care Coordination, in order to meet Unmet Mental Health Needs. (To Find a Psychiatrist, pt unsure is she wants therapy) Patient will implement clinical interventions discussed today to decrease symptoms of depression and increase knowledge and/or ability of: coping skills. Mental Health Concerns and Social Isolation Patient lacks knowledge of available community counseling agencies and resources.  Clinical Goal(s): verbalize understanding of plan for management of Anxiety, Alcohol Use Disorder, Depression, and Stress and demonstrate a reduction in symptoms. Patient will connect with a provider for ongoing mental health treatment, increase coping skills, healthy habits, self-management skills, and stress reduction        Clinical Interventions:  Assessed patient's previous and current treatment, coping skills, support system and barriers to care. Patient provided hx. Patient's main support is her grandmother who lives in [place] and comes to visit her and physically check on her in the home every 2-3 weeks.  Verbalization of feelings encouraged, motivational interviewing employed Emotional support provided, positive coping strategies explored. Establishing healthy boundaries emphasized and healthy self-care education provided Patient was educated on available mental health  resources within their area that accept Medicaid and offer psychiatry. Patient is unsure if she wishes to get therapy at this time.  Patient educated on the difference between therapy and psychiatry per patient request Email sent to patient today with available mental  health resources within her area that accept Medicaid and offer the services that she is interested in. Email included instructions for scheduling at [facility] as well as some crisis support resources and [facility]'s walk in clinic hours. Patient will review resources over the next two weeks and make a decision regarding where she wishes to gain MH treatment at. Emotional support provided. CBT intervention implemented regarding "being mentally fit" by combating negative thinking and replacing it with uplifting support, hope and positivity. Patient reports that her grandmother feels that her current psychiatric medications need adjusting.  Patient admits to ongoing alcohol usage and was educated on the mental and physical consequences of this negative coping mechanism. Assessed social determinant of health barriers Patient admits to PTSD. LCSW provided education on relaxation techniques such as meditation, deep breathing, massage, grounding exercises or yoga that can activate the body's relaxation response and ease symptoms of stress and anxiety. LCSW ask that when pt is struggling with difficult emotions and racing thoughts that they start this relaxation response process. LCSW provided extensive education on healthy coping skills for anxiety. SW used active and reflective listening, validated patient's feelings/concerns, and provided emotional support. Patient will work on implementing appropriate self-care habits into their daily routine such as: staying positive, writing a gratitude list, drinking water, staying active around the house, taking their medications as prescribed, combating negative thoughts or emotions and staying connected with their family and friends. Positive reinforcement provided for this decision to work on this.  Motivational Interviewing employed Depression screen reviewed  PHQ2/ PHQ9 completed or reviewed  Mindfulness or Relaxation training provided Active listening / Reflection  utilized  Advance Care and HCPOA education provided Emotional Support Provided Problem Solving /Task Center strategies reviewed Provided psychoeducation for mental health needs  Provided brief CBT  Reviewed mental health medications and discussed importance of compliance:  Quality of sleep assessed & Sleep Hygiene techniques promoted  Participation in counseling encouraged  Verbalization of feelings encouraged  Suicidal Ideation/Homicidal Ideation assessed: Patient denies SI/HI  Review resources, discussed options and provided patient information about  Mental Health Resources Inter-disciplinary care team collaboration (see longitudinal plan of care) Update- Patient reports that she did not successfully receive resources from either [facility] LCSW and BSW. Patient reports having issues with that email address so provided a new one, [email or web address]. [facility] LCSW re-sent resources to new email addressed and will update [facility] BSW upon her return back to the office to suggest doing the same. Patient reports that her stress has increased due to financial stressors and loss of her food stamps. Brief self-care and emotional support provided to patient. Patient was advised to please contact [facility] LCSW directly if she is unable to retrieve re-sent resources. Patient called back later in the day asking for transportation resources and her insurance does provide transportation through [employer]. She physically wrote down their number which is [phone].  Patient Goals/Self-Care Activities: Take medications as prescribed   Attend all scheduled provider appointments Call pharmacy for medication refills 3-7 days in advance of running out of medications Perform all self care activities independently  Perform IADL's (shopping, preparing meals, housekeeping, managing finances) independently Call provider office for new concerns or questions Work with the social worker to address care coordination needs  and will continue to work with the clinical team to address health care and disease management related needs call 1-800-273-TALK (toll free, 24 hour hotline) go to [facility] [address] [place] [phone]) call 911 if experiencing a Mental Health or Behavioral Health Crisis  Utilize healthy coping skills and supportive resources discussed Contact PCP with any questions or concerns Keep 90 percent of counseling appointments Call your insurance provider for more information about your Enhanced Benefits  Check out counseling resources provided  Begin personal counseling with LCSW, to reduce and manage symptoms of Depression and Stress, until well-established with mental health provider Accept all calls from representative with [facility] in an effort to establish ongoing mental health counseling and supportive services. Incorporate into daily practice - relaxation techniques, deep breathing exercises, and mindfulness meditation strategies. Talk about feelings with friends, family members, spiritual advisor, etc. Contact LCSW directly [phone]), if you have questions, need assistance, or if additional social work needs are identified between now and our next scheduled telephone outreach call. Call 988 for mental health hotline/crisis line if needed (24/7 available) Try techniques to reduce symptoms of anxiety/negative thinking (deep breathing, distraction, positive self talk, etc)  - develop a personal safety plan - develop a plan to deal with triggers like holidays, anniversaries - exercise at least 2 to 3 times per week - have a plan for how to handle bad days - journal feelings and what helps to feel better or worse - spend time or talk with others at least 2 to 3 times per week - watch for early signs of feeling worse - begin personal counseling - call and visit an old friend - check out volunteer opportunities - join a support group -  laugh; watch a funny movie or comedian - learn and use visualization or guided imagery - perform a random act of kindness - practice relaxation or meditation daily - start or continue a personal journal - practice positive thinking and self-talk -continue with compliance of taking medication  -identify current effective and ineffective coping strategies.  -implement positive self-talk in care to increase self-esteem, confidence and feelings of control.  -consider alternative and complementary therapy approaches such as meditation, mindfulness or yoga.  Call your insurance provider to gain education on benefits if desired Call your primary care doctor if symptoms get worse  -journaling, prayer, worship services, meditation or pastoral counseling.  -increase participation in pleasurable group activities such as hobbies, singing, sports or volunteering).  -consider the use of meditative movement therapy such as tai chi, yoga or qigong.  -start a regular daily exercise program based on tolerance, ability and patient choice to support positive thinking and activity    Follow Up Plan:  The patient has been provided with contact information for the care management team and has been advised to call with any mental health or health related questions or concerns.  The care management team will reach out to the patient again over the next 30 business  days.   If you are experiencing a Mental Health or Behavioral Health Crisis or need someone to talk to, please call the Suicide and Crisis Lifeline: 988    Patient Goals: Follow up goal      24- Hour Availability:    [facility]  [address] [place], [place] [phone] Crisis [phone]   [facility] [phone]  [place] [facility]  [phone]    [facility]  Crisis Services  [phone] (after hours)   Therapeutic Alternative/Mobile Crisis   [phone]    [place] National Suicide Hotline  870-814-5855 Len Childs) OR 984-457-4742   Call 2605062805 for mental health emergencies   [facility]  [phone]);  [place] and [employer]  [phone]); [place], [place], [place], [place], [place], [place], [place]    [place] [facility] for [facility] Residents For 24/7 walk-up access to mental health services for [facility] children (4+), adolescents and adults, please visit the [facility] located at [address] in [place], [place].  *[facility] also provides comprehensive outpatient behavioral health services in a variety of locations around the [place].  Connect With [place] [address] [place], [place] [address] HelpLine: 867-742-8157 or 1-281-152-9651  Get Directions  Find Help 24/7 By Phone Call our 24-hour HelpLine at [phone] or 6395941473 for immediate assistance for mental health and substance abuse issues.  Walk-In Help [place]: [facility] (Ages 4 and Up) [place]: Emergency Dept., [facility] Additional Resources National Hopeline Network: 1-800-SUICIDE The National Suicide Prevention Lifeline: 1-800-273-TALK      Dickie La, BSW, MSW, LCSW Managed Medicaid LCSW [facility]  [facility] [email or web address] Phone: [phone]

## 2023-03-19 NOTE — Patient Outreach (Signed)
Medicaid Managed Care Social Work Note  03/19/2023 Name:  Natasha Pope MRN:  [id number] DOB:  03/13/1996  Natasha Pope is an 27 y.o. year old female who is a primary patient of Natasha Olszewski, MD.  The Medicaid Managed Care Coordination team was consulted for assistance with:  Mental Health Counseling and Resources  [name] was given information about Medicaid Managed Care Coordination team services today. Natasha Pope Patient agreed to services and verbal consent obtained.  Engaged with patient  for by telephone forfollow up visit in response to referral for case management and/or care coordination services.   Assessments/Interventions:  Review of past medical history, allergies, medications, health status, including review of consultants reports, laboratory and other test data, was performed as part of comprehensive evaluation and provision of chronic care management services.  SDOH: (Social Determinant of Health) assessments and interventions performed: SDOH Interventions    Flowsheet Row Patient Outreach Telephone from 03/19/2023 in [facility] Patient Outreach Telephone from 03/05/2023 in [place] [facility] Office Visit from 01/18/2023 in [facility] Video Visit from 02/13/2021 in [facility] ED from 01/10/2020 in [facility] Emergency Department at [facility]  SDOH Interventions       Depression Interventions/Treatment  -- -- Currently on Treatment Referral to Psychiatry, Medication, Counseling, Currently on Treatment Referral to Psychiatry  Stress Interventions Offered [employer] Resources, Provide Counseling  [Due to financial struggles and loss of food stamps] [employer], Provide Counseling -- -- --       Advanced Directives Status:  See Care Plan for related entries.  Care Plan                  Allergies  Allergen Reactions   Lamotrigine Hives    Medications Reviewed Today   Medications were not reviewed in this encounter     Patient Active Problem List   Diagnosis Date Noted   Migraine 03/04/2023   Nausea 03/04/2023   Contraceptive management 01/21/2023   GERD (gastroesophageal reflux disease) 01/21/2023   Gastroenteritis 10/22/2022   Financial difficulties 10/04/2022   Snoring 04/17/2022   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 04/17/2022   Memory loss 04/03/2022   PTSD (post-traumatic stress disorder) 03/19/2022   Auditory hallucinations 01/24/2021   Visual hallucination 01/24/2021   Chronic abdominal pain 01/24/2021   Delusional disorder (HCC)    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/2019   Alcohol use disorder, severe, in early remission, dependence (HCC) 12/27/2018   Recurrent major depressive disorder (HCC) 12/27/2018   Alcohol use with alcohol-induced mood disorder (HCC)    Substance induced mood disorder (HCC) 12/09/2018   Macrocytosis 05/19/2018   ASCUS of cervix with negative high risk HPV 03/07/2018   Nicotine dependence due to vaping tobacco product 01/31/2018   Asthma 01/31/2018   GAD (generalized anxiety disorder) 01/31/2018    Conditions to be addressed/monitored per PCP order:  Anxiety  Care Plan : LCSW Plan of Care  Updates made by [name], LCSW since 03/19/2023 12:00 AM     Problem: Anxiety Identification (Anxiety)      Goal: Anxiety Symptoms Identified   Note:   Timeframe:  Short-Range Goal Priority:  High Start Date:   03/05/23             Expected End Date:  ongoing  Follow Up Date--04/04/23 at 1 pm  - keep 90 percent of scheduled appointments -consider counseling or psychiatry -consider bumping up your self-care  -consider creating a stronger support network   Why is this important?             Combatting depression may take some time.            If you don't feel better right away, don't give up  on your treatment plan.    Current barriers:   Chronic Mental Health needs related to PTSD, alcohol-use with alcohol induced mood disorder, stress and anxiety. Patient requires Support, Education, Resources, Referrals, Advocacy, and Care Coordination, in order to meet Unmet Mental Health Needs. (To Find a Psychiatrist, pt unsure is she wants therapy) Patient will implement clinical interventions discussed today to decrease symptoms of depression and increase knowledge and/or ability of: coping skills. Mental Health Concerns and Social Isolation Patient lacks knowledge of available community counseling agencies and resources.  Clinical Goal(s): verbalize understanding of plan for management of Anxiety, Alcohol Use Disorder, Depression, and Stress and demonstrate a reduction in symptoms. Patient will connect with a provider for ongoing mental health treatment, increase coping skills, healthy habits, self-management skills, and stress reduction        Clinical Interventions:  Assessed patient's previous and current treatment, coping skills, support system and barriers to care. Patient provided hx. Patient's main support is her grandmother who lives in [place] and comes to visit her and physically check on her in the home every 2-3 weeks.  Verbalization of feelings encouraged, motivational interviewing employed Emotional support provided, positive coping strategies explored. Establishing healthy boundaries emphasized and healthy self-care education provided Patient was educated on available mental health resources within their area that accept Medicaid and offer psychiatry. Patient is unsure if she wishes to get therapy at this time.  Patient educated on the difference between therapy and psychiatry per patient request Email sent to patient today with available mental health resources within her area that accept Medicaid and offer the services that she is interested in. Email included instructions for  scheduling at [facility] as well as some crisis support resources and [facility]'s walk in clinic hours. Patient will review resources over the next two weeks and make a decision regarding where she wishes to gain MH treatment at. Emotional support provided. CBT intervention implemented regarding "being mentally fit" by combating negative thinking and replacing it with uplifting support, hope and positivity. Patient reports that her grandmother feels that her current psychiatric medications need adjusting.  Patient admits to ongoing alcohol usage and was educated on the mental and physical consequences of this negative coping mechanism. Assessed social determinant of health barriers Patient admits to PTSD. LCSW provided education on relaxation techniques such as meditation, deep breathing, massage, grounding exercises or yoga that can activate the body's relaxation response and ease symptoms of stress and anxiety. LCSW ask that when pt is struggling with difficult emotions and racing thoughts that they start this relaxation response process. LCSW provided extensive education on healthy coping skills for anxiety. SW used active and reflective listening, validated patient's feelings/concerns, and provided emotional support. Patient will work on implementing appropriate self-care habits into their daily routine such as: staying positive, writing a gratitude list, drinking water, staying active around the house, taking their medications as prescribed, combating negative thoughts or emotions and staying connected with their family and friends. Positive reinforcement provided for this decision to work on this.  Motivational Interviewing employed Depression screen reviewed  PHQ2/ PHQ9 completed or reviewed  Mindfulness or Relaxation training provided Active listening / Reflection utilized  Advance Care and HCPOA education provided Emotional Support Provided Problem Solving /Task Center strategies reviewed Provided  psychoeducation for mental health needs  Provided brief CBT  Reviewed mental health medications and discussed importance of compliance:  Quality of sleep assessed & Sleep Hygiene techniques promoted  Participation in counseling encouraged  Verbalization of feelings encouraged  Suicidal Ideation/Homicidal Ideation assessed: Patient denies SI/HI  Review resources, discussed options and provided patient information about  Mental Health Resources Inter-disciplinary care team collaboration (see longitudinal plan of care) Update- Patient reports that she did not successfully receive resources from either [facility] LCSW and BSW. Patient reports having issues with that email address so provided a new one, [email or web address]. [facility] LCSW re-sent resources to new email addressed and will update [facility] BSW upon her return back to the office to suggest doing the same. Patient reports that her stress has increased due to financial stressors and loss of her food stamps. Brief self-care and emotional support provided to patient. Patient was advised to please contact [facility] LCSW directly if she is unable to retrieve re-sent resources. Patient called back later in the day asking for transportation resources and her insurance does provide transportation through [employer]. She physically wrote down their number which is [phone].  Patient Goals/Self-Care Activities: Take medications as prescribed   Attend all scheduled provider appointments Call pharmacy for medication refills 3-7 days in advance of running out of medications Perform all self care activities independently  Perform IADL's (shopping, preparing meals, housekeeping, managing finances) independently Call provider office for new concerns or questions Work with the social worker to address care coordination needs and will continue to work with the clinical team to address health care and disease management related needs call 1-800-273-TALK (toll free,  24 hour hotline) go to [facility] [address], [place] [phone]) call 911 if experiencing a Mental Health or Behavioral Health Crisis  Utilize healthy coping skills and supportive resources discussed Contact PCP with any questions or concerns Keep 90 percent of counseling appointments Call your insurance provider for more information about your Enhanced Benefits  Check out counseling resources provided  Begin personal counseling with LCSW, to reduce and manage symptoms of Depression and Stress, until well-established with mental health provider Accept all calls from representative with [facility] in an effort to establish ongoing mental health counseling and supportive services. Incorporate into daily practice - relaxation techniques, deep breathing exercises, and mindfulness meditation strategies. Talk about feelings with friends, family members, spiritual advisor, etc. Contact LCSW directly [phone]), if you have questions, need assistance, or if additional social work needs are identified between now and our next scheduled telephone outreach call. Call 988 for mental health hotline/crisis line if needed (24/7 available) Try techniques to reduce symptoms of anxiety/negative thinking (deep breathing, distraction, positive self talk, etc)  - develop a personal safety plan - develop a plan to deal with triggers like holidays, anniversaries - exercise at least 2 to 3 times per week - have a plan for how to handle bad days - journal feelings and what helps to feel better or worse - spend time or talk with others at least 2 to 3 times per week - watch for early signs of feeling worse - begin personal counseling - call and visit an old friend - check out volunteer opportunities - join a support group - laugh; watch a funny movie or comedian -  learn and use visualization or guided imagery - perform a random act of kindness - practice relaxation  or meditation daily - start or continue a personal journal - practice positive thinking and self-talk -continue with compliance of taking medication  -identify current effective and ineffective coping strategies.  -implement positive self-talk in care to increase self-esteem, confidence and feelings of control.  -consider alternative and complementary therapy approaches such as meditation, mindfulness or yoga.  Call your insurance provider to gain education on benefits if desired Call your primary care doctor if symptoms get worse  -journaling, prayer, worship services, meditation or pastoral counseling.  -increase participation in pleasurable group activities such as hobbies, singing, sports or volunteering).  -consider the use of meditative movement therapy such as tai chi, yoga or qigong.  -start a regular daily exercise program based on tolerance, ability and patient choice to support positive thinking and activity    Follow Up Plan:  The patient has been provided with contact information for the care management team and has been advised to call with any mental health or health related questions or concerns.  The care management team will reach out to the patient again over the next 30 business  days.   If you are experiencing a Mental Health or Behavioral Health Crisis or need someone to talk to, please call the Suicide and Crisis Lifeline: 988    Patient Goals: Follow up goal        Follow up:  Patient agrees to Care Plan and Follow-up.  Plan: The Managed Medicaid care management team will reach out to the patient again over the next 30 days.  Dickie La, BSW, MSW, [employer] Managed Medicaid LCSW [facility]  [facility] [email or web address] Phone: [phone]

## 2023-03-20 ENCOUNTER — Ambulatory Visit: Payer: Medicaid Other | Admitting: Internal Medicine

## 2023-03-20 ENCOUNTER — Encounter: Payer: Self-pay | Admitting: Internal Medicine

## 2023-03-20 VITALS — BP 128/91 | HR 83 | Temp 97.3°F | Ht 65.0 in | Wt 200.0 lb

## 2023-03-20 DIAGNOSIS — F325 Major depressive disorder, single episode, in full remission: Secondary | ICD-10-CM

## 2023-03-20 DIAGNOSIS — F411 Generalized anxiety disorder: Secondary | ICD-10-CM | POA: Diagnosis not present

## 2023-03-20 DIAGNOSIS — Z560 Unemployment, unspecified: Secondary | ICD-10-CM | POA: Diagnosis not present

## 2023-03-20 DIAGNOSIS — F1094 Alcohol use, unspecified with alcohol-induced mood disorder: Secondary | ICD-10-CM

## 2023-03-20 NOTE — Patient Instructions (Signed)
VISIT SUMMARY:  During our appointment, we discussed your progress in managing your alcohol use disorder and depression. You've been doing well with your medication regimen and have been abstaining from alcohol. We also talked about your desire to find employment and your willingness to engage in peer support activities. However, we noted the absence of a sponsor or active participation in a 12-step program, which could be beneficial for your continued recovery.  YOUR PLAN:  -ALCOHOL USE DISORDER: Alcohol Use Disorder is a condition where you have a strong craving for alcohol and cannot control your drinking. We discussed the importance of staying sober and the risk of relapse. You're encouraged to continue using a sobriety tracking app and participating in a 12-step program.  -DEPRESSION: Depression is a mood disorder that causes a persistent feeling of sadness and loss of interest. We talked about the importance of taking your medication consistently and the risks if you stop suddenly. You should continue taking Lexapro, Buspar, and Ovelity as prescribed and seek psychiatric and psychological support.  -GENERAL HEALTH MAINTENANCE: We discussed the importance of regular check-ups, even when you're feeling well. We also talked about the potential for serotonin syndrome, a potentially life-threatening condition that can occur when medications cause too much serotonin to accumulate in your body. You should continue regular follow-up appointments and check in on September 16th.  -EMPLOYMENT AND FINANCIAL STABILITY: We discussed the importance of finding a job and paying taxes for future eligibility for disability benefits. You're recommended to contact 211 for assistance with job placement and other services.  INSTRUCTIONS:  Remember to continue taking your medications as prescribed and to keep track of your sobriety. Participate in a 12-step program if possible. Keep your follow-up appointments and  check in on September 16th. For assistance with job placement and other services, contact 211.

## 2023-03-20 NOTE — Assessment & Plan Note (Signed)
She report continued sobriety with two past slips but none recently. We discussed the importance of maintaining sobriety and the risk of late relapse. She are encouraged to continue using a sobriety tracking app and participating in a 12-step program. The current treatment plan and sobriety efforts will continue.she also continue(s) with acamprosate regularly taking

## 2023-03-20 NOTE — Progress Notes (Signed)
[facility]: [phone]   -- Medical Office Visit --  Patient:  Natasha Pope      Age: 27 y.o.       Sex:  female  Date:   03/20/2023 Patient Care Team: [name], MD as PCP - General (Internal Medicine) Pucilowski, Roosvelt Maser, MD (Inactive) as Consulting Physician (Psychiatry) Today's Healthcare Provider: [name], MD      Assessment & Plan Alcohol use with alcohol-induced mood disorder [facility]) She report continued sobriety with two past slips but none recently. We discussed the importance of maintaining sobriety and the risk of late relapse. She are encouraged to continue using a sobriety tracking app and participating in a 12-step program. The current treatment plan and sobriety efforts will continue.she also continue(s) with acamprosate regularly taking Major depressive disorder, single episode, in remission [facility]) She report an improved mood with medication adherence. We discussed the importance of consistent medication use and the risks associated with abrupt discontinuation, including withdrawal symptoms and the return of depressive symptoms. She will continue Lexapro 20mg  daily, Buspar, and Auvelity as prescribed and are encouraged to seek psychiatric and psychological support.We discussed the importance of maintaining regular appointments, even during periods of improved mental health, and the potential for serotonin syndrome due to multiple serotonergic medications. Recognizing symptoms of serotonin syndrome is crucial. She will continue regular follow-up appointments and check in on September 16th.Marland Kitchen  GAD (generalized anxiety disorder)  Unemployed We discussed the importance of gaining employment and paying taxes for future eligibility for disability benefits. She are recommended to contact [employer] for assistance with job placement and other services. Encouragement was given to contact [employer] for assistance with job placement and other services.    23 minutes were spent in face to face counseling on managing her tenuous situation where grandmother is paying for her to have apartment, developing strategies to maintain improved mood, avoid return to use, and plan for return to work    There are no diagnoses linked to this encounter. Recommended follow-up: 2 weeks , housing insecurity / depression / unemployed /alcohol use disorder            Subjective   27 y.o. female who 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; Financial difficulties; Gastroenteritis; Contraceptive management; GERD (gastroesophageal reflux disease); Migraine; and Nausea on their problem list. Her reasons/main concerns/chief complaints for today's office visit are 2 week follow-up   ------------------------------------------------------------------------------------------------------------------------ AI-Extracted: Discussed the use of AI scribe software for clinical note transcription with the patient, who gave verbal consent to proceed.  History of Present Illness   The patient, with a history of alcohol use disorder and depression, reports a significant improvement in her mental health. She has been adhering to her prescribed medication regimen, which includes Lexapro, Campral, Buspar, and Ovelity. She notes that if she misses a dose, she begins to experience feelings of sadness, suggesting the importance of consistent medication  adherence.  The patient has been abstaining from alcohol and reports no cravings, although she does reflect on past experiences with alcohol. She acknowledges the negative impact of alcohol on her  health and expresses a desire to maintain her current state of sobriety.  Despite the improvement in her mental health, the patient reports some difficulty in securing employment, potentially due to a criminal background. She expresses a desire to work and contribute to society, indicating a positive outlook and motivation for further improvement.  The patient is currently not participating in a 12-step program and has lost her sponsor. She is open to the idea of becoming a sponsor herself in the future, suggesting a willingness to engage in peer support activities.  In summary, the patient is demonstrating significant progress in managing her alcohol use disorder and depression. She is adhering to her medication regimen, abstaining from alcohol, and actively seeking employment. However, she is currently without a sponsor or active participation in a 12-step program, which may be beneficial for continued recovery.      She has a past medical history of Anxiety, Asthma, Depression, Gastroenteritis (10/22/2022), History of alcohol withdrawal delirium (07/04/2022), History of seizure due to alcohol withdrawal (02/28/2021), Memory loss (04/03/2022), Nausea vomiting and diarrhea (06/27/2022), Nicotine dependence due to vaping tobacco product (01/31/2018), 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 (04/17/2022), PTSD (post-traumatic stress disorder) (03/19/2022), Pyelonephritis, Seizure-like activity (HCC), Seizures (HCC), Sepsis (HCC), Snoring (04/17/2022), Thrombocytopenia (HCC) (05/19/2018), Unwanted pregnancy (11/26/2022), and Withdrawal syndrome (HCC) (09/27/2022).  Problem list overviews that were updated at today's visit: No problems updated. Current Outpatient Medications on File Prior to Visit  Medication Sig    acamprosate (CAMPRAL) 333 MG tablet Take 2 tablets (666 mg total) by mouth 3 (three) times daily with meals.   busPIRone (BUSPAR) 15 MG tablet Take 1 tablet (15 mg total) by mouth 3 (three) times daily. Must be taken consistently.   Dextromethorphan-buPROPion ER (AUVELITY) 45-105 MG TBCR Take 1 tablet by mouth 2 (two) times daily. To start, take just one tablet once daily in the morning. After 3 days, increase to the maximum recommended dosage of one tablet twice daily, given at least 8 hours apart. Do not exceed two doses within the same day.   escitalopram (LEXAPRO) 20 MG tablet Take 1 tablet (20 mg total) by mouth daily.   methocarbamol (ROBAXIN) 500 MG tablet Take 1 tablet (500 mg total) by mouth 4 (four) times daily.   Multiple Vitamin (MULTIVITAMIN WITH MINERALS) TABS tablet Take 1 tablet by mouth daily.   norethindrone-ethinyl estradiol-FE (HAILEY FE 1/20) 1-20 MG-MCG tablet Take 1 tablet by mouth daily.   omeprazole (PRILOSEC) 20 MG capsule Take 2 capsules (40 mg total) by mouth daily.   ondansetron (ZOFRAN) 4 MG tablet Take 1 tablet (4 mg total) by mouth every 8 (eight) hours as needed for nausea or vomiting.   ondansetron (ZOFRAN-ODT) 4 MG disintegrating tablet Take 1 tablet (4 mg total) by mouth every 8 (eight) hours as needed for nausea or vomiting.   propranolol (INDERAL) 20 MG tablet Take 1 tablet (20 mg total) by mouth 3 (three) times daily.   QUEtiapine (SEROQUEL) 100 MG tablet Take 1 tablet (100 mg total) by mouth 2 (two) times daily. Take as 50 mg in morning, 150 mg in evening.  Stop duloxetine and trazodone. Ok to take with elevate.   rizatriptan (MAXALT) 5 MG tablet Take 5 mg by mouth as needed for migraine. May repeat in 2 hours if needed   thiamine (VITAMIN B-1) 100 MG tablet Take 1 tablet (100 mg total) by mouth daily.   topiramate (TOPAMAX) 25 MG tablet Take 2 tablets (50 mg total) by mouth in the morning, at noon,  and at bedtime.   traZODone (DESYREL) 100 MG tablet Take 100 mg  by mouth at bedtime.   [DISCONTINUED] Dextromethorphan HBr 15 MG TABS Take 3 tablets by mouth daily at 6 (six) AM.   No current facility-administered medications on file prior to visit.  There are no discontinued medications.   Objective   Physical Exam  BP (!) 128/91 (BP Location: Right Arm, Patient Position: Sitting)   Pulse 83   Temp (!) 97.3 F (36.3 C) (Temporal)   Ht 5\' 5"  (1.651 m)   Wt 200 lb (90.7 kg)   SpO2 97%   BMI 33.28 kg/m  Wt Readings from Last 10 Encounters:  03/20/23 200 lb (90.7 kg)  03/04/23 194 lb 3.2 oz (88.1 kg)  02/22/23 195 lb 6.4 oz (88.6 kg)  02/01/23 186 lb (84.4 kg)  01/21/23 195 lb 12.8 oz (88.8 kg)  01/18/23 188 lb 6 oz (85.4 kg)  12/11/22 194 lb 6.4 oz (88.2 kg)  11/26/22 191 lb (86.6 kg)  11/23/22 189 lb (85.7 kg)  11/12/22 187 lb (84.8 kg)   Vital signs reviewed.  Nursing notes reviewed. Weight trend reviewed. Abnormalities and Problem-Specific physical exam findings:  very well grooomed, hygiene excellent, mood great.   General Appearance:  No acute distress appreciable.   Well-groomed, healthy-appearing female.  Well proportioned with no abnormal fat distribution.  Good muscle tone. Pulmonary:  Normal work of breathing at rest, no respiratory distress apparent. SpO2: 97 %  Musculoskeletal: All extremities are intact.  Neurological:  Awake, alert, oriented, and engaged.  No obvious focal neurological deficits or cognitive impairments.  Sensorium seems unclouded.   Speech is clear and coherent with logical content. Psychiatric:  Appropriate mood, pleasant and cooperative demeanor, thoughtful and engaged during the exam  Results            No results found for any visits on 03/20/23.  Office Visit on 02/01/2023  Component Date Value   Rapid Strep A Screen 02/01/2023 Negative    SARS Coronavirus 2 Ag 02/01/2023 Negative    Glucose, Bld 02/01/2023 99    BUN 02/01/2023 8    Creat 02/01/2023 0.99 (H)    BUN/Creatinine Ratio 02/01/2023 8     Sodium 02/01/2023 134 (L)    Potassium 02/01/2023 3.0 (L)    Chloride 02/01/2023 90 (L)    CO2 02/01/2023 24    Calcium 02/01/2023 9.6    Total Protein 02/01/2023 8.2 (H)    Albumin 02/01/2023 5.0    Globulin 02/01/2023 3.2    AG Ratio 02/01/2023 1.6    Total Bilirubin 02/01/2023 0.9    Alkaline phosphatase (AP* 02/01/2023 149 (H)    AST 02/01/2023 107 (H)    ALT 02/01/2023 52 (H)    Hgb A1c MFr Bld 02/01/2023 5.1    Mean Plasma Glucose 02/01/2023 100    eAG (mmol/L) 02/01/2023 5.5   Office Visit on 08/29/2022  Component Date Value   High risk HPV 08/29/2022 Negative    Neisseria Gonorrhea 08/29/2022 Negative    Chlamydia 08/29/2022 Negative    Trichomonas 08/29/2022 Negative    Adequacy 08/29/2022 Satisfactory for evaluation; transformation zone component PRESENT.    Diagnosis 08/29/2022 - Negative for intraepithelial lesion or malignancy (NILM)    Microorganisms 08/29/2022 Shift in flora suggestive of bacterial vaginosis    Comment 08/29/2022 Normal Reference Range HPV - Negative    Comment 08/29/2022 Normal Reference Range Trichomonas - Negative    Comment 08/29/2022 Normal Reference Ranger Chlamydia - Negative  Comment 08/29/2022 Normal Reference Range Neisseria Gonorrhea - Negative    RPR Ser Ql 08/29/2022 NON-REACTIVE    HIV 1&2 Ab, 4th Generati* 08/29/2022 NON-REACTIVE    Hepatitis C Ab 08/29/2022 NON-REACTIVE   Office Visit on 08/03/2022  Component Date Value   SARS Coronavirus 2 Ag 08/03/2022 Negative    Influenza A, POC 08/03/2022 Negative    Influenza B, POC 08/03/2022 Negative    Rapid Strep A Screen 08/03/2022 Negative    Color, Urine 08/03/2022 ORANGE (A)    APPearance 08/03/2022 SL CLOUDY (A)    Specific Gravity, Urine 08/03/2022 1.015    pH 08/03/2022 8.0    Total Protein, Urine 08/03/2022 30 (A)    Urine Glucose 08/03/2022 NEGATIVE    Ketones, ur 08/03/2022 15 (A)    Bilirubin Urine 08/03/2022 SMALL (A)    Hgb urine dipstick 08/03/2022 NEGATIVE     Urobilinogen, UA 08/03/2022 2.0 (A)    Leukocytes,Ua 08/03/2022 NEGATIVE    Nitrite 08/03/2022 NEGATIVE    WBC, UA 08/03/2022 0-2/hpf    RBC / HPF 08/03/2022 0-2/hpf    Squamous Epithelial / HPF 08/03/2022 Many(>10/hpf) (A)    Bacteria, UA 08/03/2022 Few(10-50/hpf) (A)    Epithelial Casts, UA 08/03/2022 Presence of (A)    Lipase 08/03/2022 6 (L)    Glucose, Bld 08/03/2022 114 (H)    BUN 08/03/2022 4 (L)    Creat 08/03/2022 0.74    BUN/Creatinine Ratio 08/03/2022 5 (L)    Sodium 08/03/2022 135    Potassium 08/03/2022 3.4 (L)    Chloride 08/03/2022 97 (L)    CO2 08/03/2022 18 (L)    Calcium 08/03/2022 9.1    Total Protein 08/03/2022 7.9    Albumin 08/03/2022 4.5    Globulin 08/03/2022 3.4    AG Ratio 08/03/2022 1.3    Total Bilirubin 08/03/2022 0.7    Alkaline phosphatase (AP* 08/03/2022 69    AST 08/03/2022 21    ALT 08/03/2022 18    WBC 08/03/2022 12.4 (H)    RBC 08/03/2022 4.09    Hemoglobin 08/03/2022 13.7    HCT 08/03/2022 39.2    MCV 08/03/2022 95.8    MCH 08/03/2022 33.5 (H)    MCHC 08/03/2022 34.9    RDW 08/03/2022 15.6 (H)    Platelets 08/03/2022 285    MPV 08/03/2022 10.8    Amylase 08/03/2022 66   Admission on 06/26/2022, Discharged on 06/29/2022  Component Date Value   WBC 06/26/2022 7.0    RBC 06/26/2022 3.53 (L)    Hemoglobin 06/26/2022 12.8    HCT 06/26/2022 36.6    MCV 06/26/2022 103.7 (H)    MCH 06/26/2022 36.3 (H)    MCHC 06/26/2022 35.0    RDW 06/26/2022 17.6 (H)    Platelets 06/26/2022 203    nRBC 06/26/2022 0.6 (H)    Sodium 06/26/2022 136    Potassium 06/26/2022 2.9 (L)    Chloride 06/26/2022 94 (L)    CO2 06/26/2022 24    Glucose, Bld 06/26/2022 164 (H)    BUN 06/26/2022 6    Creatinine, Ser 06/26/2022 0.77    Calcium 06/26/2022 9.0    Total Protein 06/26/2022 7.3    Albumin 06/26/2022 4.3    AST 06/26/2022 65 (H)    ALT 06/26/2022 30    Alkaline Phosphatase 06/26/2022 86    Total Bilirubin 06/26/2022 1.5 (H)    GFR, Estimated  06/26/2022 >60    Anion gap 06/26/2022 18 (H)    Color, Urine 06/27/2022 AMBER (A)  APPearance 06/27/2022 HAZY (A)    Specific Gravity, Urine 06/27/2022 1.024    pH 06/27/2022 7.0    Glucose, UA 06/27/2022 NEGATIVE    Hgb urine dipstick 06/27/2022 NEGATIVE    Bilirubin Urine 06/27/2022 SMALL (A)    Ketones, ur 06/27/2022 20 (A)    Protein, ur 06/27/2022 100 (A)    Nitrite 06/27/2022 NEGATIVE    Leukocytes,Ua 06/27/2022 NEGATIVE    RBC / HPF 06/27/2022 0-5    WBC, UA 06/27/2022 0-5    Bacteria, UA 06/27/2022 NONE SEEN    Squamous Epithelial / HPF 06/27/2022 6-10    Mucus 06/27/2022 PRESENT    Hyaline Casts, UA 06/27/2022 PRESENT    Preg Test, Ur 06/27/2022 NEGATIVE    Alcohol, Ethyl (B) 06/26/2022 <10    Lactic Acid, Venous 06/26/2022 1.5    Opiates 06/27/2022 NONE DETECTED    Cocaine 06/27/2022 NONE DETECTED    Benzodiazepines 06/27/2022 POSITIVE (A)    Amphetamines 06/27/2022 NONE DETECTED    Tetrahydrocannabinol 06/27/2022 POSITIVE (A)    Barbiturates 06/27/2022 NONE DETECTED    HIV Screen 4th Generatio* 06/27/2022 Non Reactive    Magnesium 06/27/2022 1.5 (L)    Phosphorus 06/27/2022 3.2    WBC 06/27/2022 7.2    RBC 06/27/2022 3.27 (L)    Hemoglobin 06/27/2022 11.8 (L)    HCT 06/27/2022 34.0 (L)    MCV 06/27/2022 104.0 (H)    MCH 06/27/2022 36.1 (H)    MCHC 06/27/2022 34.7    RDW 06/27/2022 17.8 (H)    Platelets 06/27/2022 186    nRBC 06/27/2022 0.8 (H)    Sodium 06/27/2022 135    Potassium 06/27/2022 3.3 (L)    Chloride 06/27/2022 94 (L)    CO2 06/27/2022 26    Glucose, Bld 06/27/2022 118 (H)    BUN 06/27/2022 6    Creatinine, Ser 06/27/2022 0.77    Calcium 06/27/2022 8.7 (L)    Total Protein 06/27/2022 6.7    Albumin 06/27/2022 4.2    AST 06/27/2022 46 (H)    ALT 06/27/2022 25    Alkaline Phosphatase 06/27/2022 76    Total Bilirubin 06/27/2022 1.3 (H)    GFR, Estimated 06/27/2022 >60    Anion gap 06/27/2022 15    Sodium 06/28/2022 137    Potassium  06/28/2022 2.5 (LL)    Chloride 06/28/2022 99    CO2 06/28/2022 26    Glucose, Bld 06/28/2022 93    BUN 06/28/2022 <5 (L)    Creatinine, Ser 06/28/2022 0.71    Calcium 06/28/2022 9.2    Total Protein 06/28/2022 6.6    Albumin 06/28/2022 4.0    AST 06/28/2022 51 (H)    ALT 06/28/2022 26    Alkaline Phosphatase 06/28/2022 72    Total Bilirubin 06/28/2022 1.2    GFR, Estimated 06/28/2022 >60    Anion gap 06/28/2022 12    WBC 06/28/2022 5.8    RBC 06/28/2022 3.47 (L)    Hemoglobin 06/28/2022 12.6    HCT 06/28/2022 36.6    MCV 06/28/2022 105.5 (H)    MCH 06/28/2022 36.3 (H)    MCHC 06/28/2022 34.4    RDW 06/28/2022 18.1 (H)    Platelets 06/28/2022 133 (L)    nRBC 06/28/2022 0.7 (H)    Magnesium 06/28/2022 2.0    Sodium 06/28/2022 138    Potassium 06/28/2022 5.8 (H)    Chloride 06/28/2022 107    CO2 06/28/2022 19 (L)    Glucose, Bld 06/28/2022 99    BUN 06/28/2022 <5 (L)  Creatinine, Ser 06/28/2022 1.22 (H)    Calcium 06/28/2022 9.5    GFR, Estimated 06/28/2022 >60    Anion gap 06/28/2022 12    Sodium 06/29/2022 139    Potassium 06/29/2022 3.6    Chloride 06/29/2022 105    CO2 06/29/2022 22    Glucose, Bld 06/29/2022 88    BUN 06/29/2022 <5 (L)    Creatinine, Ser 06/29/2022 0.69    Calcium 06/29/2022 9.2    GFR, Estimated 06/29/2022 >60    Anion gap 06/29/2022 12    Magnesium 06/29/2022 1.7    TSH 06/29/2022 5.813 (H)   Office Visit on 06/01/2022  Component Date Value   Preg Test, Ur 06/01/2022 Negative    SARS Coronavirus 2 Ag 06/01/2022 Negative    Influenza A, POC 06/01/2022 Negative    Influenza B, POC 06/01/2022 Negative    No image results found.   No results found.  No results found.     Additional Info: This encounter employed real-time, collaborative documentation. The patient actively reviewed and updated their medical record on a shared screen, ensuring transparency and facilitating joint problem-solving for the problem list, overview, and plan.  This approach promotes accurate, informed care. The treatment plan was discussed and reviewed in detail, including medication safety, potential side effects, and all patient questions. We confirmed understanding and comfort with the plan. Follow-up instructions were established, including contacting the office for any concerns, returning if symptoms worsen, persist, or new symptoms develop, and precautions for potential emergency department visits.

## 2023-04-04 ENCOUNTER — Other Ambulatory Visit: Payer: Medicaid Other | Admitting: Licensed Clinical Social Worker

## 2023-04-04 NOTE — Patient Outreach (Signed)
Medicaid Managed Care Social Work Note  04/04/2023 Name:  Natasha Pope MRN:  [id number] DOB:  08/26/1995  Natasha Pope is an 27 y.o. year old female who is a primary patient of [name], MD.  The Medicaid Managed Care Coordination team was consulted for assistance with:  Mental Health Counseling and Resources  Natasha Pope was given information about Medicaid Managed Care Coordination team services today. Natasha Pope Patient agreed to services and verbal consent obtained.  Engaged with patient  for by telephone forfollow up visit in response to referral for case management and/or care coordination services.   Assessments/Interventions:  Review of past medical history, allergies, medications, health status, including review of consultants reports, laboratory and other test data, was performed as part of comprehensive evaluation and provision of chronic care management services.  SDOH: (Social Determinant of Health) assessments and interventions performed: SDOH Interventions    Flowsheet Row Patient Outreach Telephone from 04/04/2023 in [place] POPULATION HEALTH DEPARTMENT Patient Outreach Telephone from 03/19/2023 in [place] POPULATION HEALTH DEPARTMENT Patient Outreach Telephone from 03/05/2023 in [place] POPULATION HEALTH DEPARTMENT Office Visit from 01/18/2023 in [facility] Video Visit from 02/13/2021 in [facility] ED from 01/10/2020 in [facility] Emergency Department at [facility]  SDOH Interventions        Depression Interventions/Treatment  -- -- -- Currently on Treatment Referral to Psychiatry, Medication, Counseling, Currently on Treatment Referral to Psychiatry  Stress Interventions Provide Counseling, Offered Community Wellness Resources Offered [employer] Resources, Provide Counseling  [Due to financial struggles and loss of food stamps] [employer], Provide Counseling -- -- --       Advanced Directives Status:  See Care Plan for related entries.  Care Plan                 Allergies  Allergen Reactions   Lamotrigine Hives    Medications Reviewed Today   Medications were not reviewed in this encounter     Patient Active Problem List   Diagnosis Date Noted   Migraine 03/04/2023   Nausea 03/04/2023   Contraceptive management 01/21/2023   GERD (gastroesophageal reflux disease) 01/21/2023   Gastroenteritis 10/22/2022   Financial difficulties 10/04/2022   Snoring 04/17/2022   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 04/17/2022   Memory loss 04/03/2022   PTSD (post-traumatic stress disorder) 03/19/2022   Auditory hallucinations 01/24/2021   Visual hallucination 01/24/2021   Chronic abdominal pain 01/24/2021   Delusional disorder (HCC)    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/2019   Alcohol use disorder, severe, in early remission, dependence (HCC) 12/27/2018   Recurrent major depressive disorder (HCC) 12/27/2018   Alcohol use with alcohol-induced mood disorder (HCC)    Substance induced mood disorder (HCC) 12/09/2018   Macrocytosis 05/19/2018   ASCUS of cervix with negative high risk HPV 03/07/2018   Nicotine dependence due to vaping tobacco product 01/31/2018   Asthma 01/31/2018   GAD (generalized anxiety disorder) 01/31/2018    Conditions to be addressed/monitored per PCP order:  Anxiety  Care Plan : LCSW Plan of Care  Updates made by [name], LCSW since 04/04/2023 12:00 AM     Problem: Anxiety Identification (Anxiety)      Goal: Anxiety Symptoms Identified   Note:   Timeframe:  Short-Range Goal Priority:  High Start Date:   03/05/23             Expected End Date:  ongoing  Follow Up Date--04/23/23 at 11 am  - keep 90 percent of scheduled appointments -consider counseling or psychiatry -consider bumping up your self-care  -consider creating a stronger  support network   Why is this important?             Combatting depression may take some time.            If you don't feel better right away, don't give up on your treatment plan.    Current barriers:   Chronic Mental Health needs related to PTSD, alcohol-use with alcohol induced mood disorder, stress and anxiety. Patient requires Support, Education, Resources, Referrals, Advocacy, and Care Coordination, in order to meet Unmet Mental Health Needs. (To Find a Psychiatrist, pt unsure is she wants therapy) Patient will implement clinical interventions discussed today to decrease symptoms of depression and increase knowledge and/or ability of: coping skills. Mental Health Concerns and Social Isolation Patient lacks knowledge of available community counseling agencies and resources.  Clinical Goal(s): verbalize understanding of plan for management of Anxiety, Alcohol Use Disorder, Depression, and Stress and demonstrate a reduction in symptoms. Patient will connect with a provider for ongoing mental health treatment, increase coping skills, healthy habits, self-management skills, and stress reduction        Clinical Interventions:  Assessed patient's previous and current treatment, coping skills, support system and barriers to care. Patient provided hx. Patient's main support is her grandmother who lives in [place] and comes to visit her and physically check on her in the home every 2-3 weeks.  Verbalization of feelings encouraged, motivational interviewing employed Emotional support provided, positive coping strategies explored. Establishing healthy boundaries emphasized and healthy self-care education provided Patient was educated on available mental health resources within their area that accept Medicaid and offer psychiatry. Patient is unsure if she wishes to get therapy at this time.  Patient educated on the difference between therapy and psychiatry per patient request Email sent to patient today  with available mental health resources within her area that accept Medicaid and offer the services that she is interested in. Email included instructions for scheduling at [facility] as well as some crisis support resources and [facility]'s walk in clinic hours. Patient will review resources over the next two weeks and make a decision regarding where she wishes to gain MH treatment at. Emotional support provided. CBT intervention implemented regarding "being mentally fit" by combating negative thinking and replacing it with uplifting support, hope and positivity. Patient reports that her grandmother feels that her current psychiatric medications need adjusting.  Patient admits to ongoing alcohol usage and was educated on the mental and physical consequences of this negative coping mechanism. Assessed social determinant of health barriers Patient admits to PTSD. LCSW provided education on relaxation techniques such as meditation, deep breathing, massage, grounding exercises or yoga that can activate the body's relaxation response and ease symptoms of stress and anxiety. LCSW ask that when pt is struggling with difficult emotions and racing thoughts that they start this relaxation response process. LCSW provided extensive education on healthy coping skills for anxiety. SW used active and reflective listening, validated patient's feelings/concerns, and provided emotional support. Patient will work on implementing appropriate self-care habits into their daily routine such as: staying positive, writing a gratitude list, drinking water, staying active around the house, taking their medications as prescribed, combating negative thoughts or emotions and staying connected with their family and friends. Positive reinforcement provided for this decision to work on this.  Motivational Interviewing employed Depression screen reviewed  PHQ2/ PHQ9 completed or reviewed  Mindfulness or Relaxation training provided Active  listening / Reflection utilized  Advance Care and HCPOA education provided Emotional Support Provided Problem Solving /Task Center strategies reviewed Provided psychoeducation for mental health needs  Provided brief CBT  Reviewed mental health medications and discussed importance of compliance:  Quality of sleep assessed & Sleep Hygiene techniques promoted  Participation in counseling encouraged  Verbalization of feelings encouraged  Suicidal Ideation/Homicidal Ideation assessed: Patient denies SI/HI  Review resources, discussed options and provided patient information about  Mental Health Resources Inter-disciplinary care team collaboration (see longitudinal plan of care) Update- Patient reports that she did not successfully receive resources from either [facility] LCSW and BSW. Patient reports having issues with that email address so provided a new one, [email or web address]. [facility] LCSW re-sent resources to new email addressed and will update [facility] BSW upon her return back to the office to suggest doing the same. Patient reports that her stress has increased due to financial stressors and loss of her food stamps. Brief self-care and emotional support provided to patient. Patient was advised to please contact [facility] LCSW directly if she is unable to retrieve re-sent resources. Patient called back later in the day asking for transportation resources and her insurance does provide transportation through [employer]. She physically wrote down their number which is [phone]. 04/04/23 update-Patient, grandmother and [facility] LCSW completed joint phone call together. Patient reports having psychiatry and counselor already in place but does not know the agency name and neither does grandmother. She reports that she will retrieve this information successfully. Grandmother reports that she believes patient only has a Veterinary surgeon and they would like to review the resources again to find a agency that has both  counseling and psychiatry. Email sent to grandmother with requested [facility] resources. Family will meet together this weekend and then chose an agency that would like [facility] LCSW to place a referral during next outreach. Per recent PCP visit, she reports continued sobriety with two past slips but none recently. [facility] LCSW and PCP discussed with patient the importance of maintaining sobriety and the risk of late relapse. She was encouraged to continue using a sobriety tracking app and participating in a 12-step program. [facility] LCSW informed grandmother that PCP is recommending ongoing 12 step program involvement and for patient to connect with a sponsor which could be beneficial for her continued recovery. Grandmother expressed understanding. [facility] LCSW will follow up within 30 days.  Patient Goals/Self-Care Activities: Take medications as prescribed   Attend all scheduled provider appointments Call pharmacy for medication refills 3-7 days in advance of running out of medications Perform all self care activities independently  Perform IADL's (shopping, preparing meals, housekeeping, managing finances) independently Call provider office for new concerns or questions Work with the social worker to address care coordination needs and will continue to work with the clinical team to address health care and disease management related needs call 1-800-273-TALK (toll free, 24 hour hotline) go to [facility] [address], [place] [phone]) call 911 if experiencing a Mental Health or Behavioral Health Crisis  Utilize healthy coping skills and supportive resources discussed Contact PCP with any questions or concerns Keep 90 percent of counseling appointments Call your insurance provider for more information about your Enhanced Benefits  Check out counseling resources provided  Begin personal counseling with LCSW, to reduce and manage symptoms of Depression and Stress, until  well-established with mental health provider Accept all calls from representative with [facility]  in an effort to establish ongoing mental health counseling and supportive services. Incorporate into daily practice - relaxation techniques, deep breathing exercises, and mindfulness meditation strategies. Talk about feelings with friends, family members, spiritual advisor, etc. Contact LCSW directly [phone]), if you have questions, need assistance, or if additional social work needs are identified between now and our next scheduled telephone outreach call. Call 988 for mental health hotline/crisis line if needed (24/7 available) Try techniques to reduce symptoms of anxiety/negative thinking (deep breathing, distraction, positive self talk, etc)  - develop a personal safety plan - develop a plan to deal with triggers like holidays, anniversaries - exercise at least 2 to 3 times per week - have a plan for how to handle bad days - journal feelings and what helps to feel better or worse - spend time or talk with others at least 2 to 3 times per week - watch for early signs of feeling worse - begin personal counseling - call and visit an old friend - check out volunteer opportunities - join a support group - laugh; watch a funny movie or comedian - learn and use visualization or guided imagery - perform a random act of kindness - practice relaxation or meditation daily - start or continue a personal journal - practice positive thinking and self-talk -continue with compliance of taking medication  -identify current effective and ineffective coping strategies.  -implement positive self-talk in care to increase self-esteem, confidence and feelings of control.  -consider alternative and complementary therapy approaches such as meditation, mindfulness or yoga.  Call your insurance provider to gain education on benefits if desired Call your primary care doctor if symptoms get worse  -journaling,  prayer, worship services, meditation or pastoral counseling.  -increase participation in pleasurable group activities such as hobbies, singing, sports or volunteering).  -consider the use of meditative movement therapy such as tai chi, yoga or qigong.  -start a regular daily exercise program based on tolerance, ability and patient choice to support positive thinking and activity    Follow Up Plan:  The patient has been provided with contact information for the care management team and has been advised to call with any mental health or health related questions or concerns.  The care management team will reach out to the patient again over the next 30 business  days.   If you are experiencing a Mental Health or Behavioral Health Crisis or need someone to talk to, please call the Suicide and Crisis Lifeline: 988    Patient Goals: Follow up goal        Follow up:  Patient agrees to Care Plan and Follow-up.  Plan: The Managed Medicaid care management team will reach out to the patient again over the next 30 days.  Dickie La, BSW, MSW, [employer] Managed Medicaid LCSW [facility]  [facility] [email or web address] Phone: [phone]

## 2023-04-06 ENCOUNTER — Other Ambulatory Visit: Payer: Self-pay | Admitting: Internal Medicine

## 2023-04-06 DIAGNOSIS — K219 Gastro-esophageal reflux disease without esophagitis: Secondary | ICD-10-CM

## 2023-04-09 ENCOUNTER — Other Ambulatory Visit: Payer: Medicaid Other

## 2023-04-09 NOTE — Patient Instructions (Signed)
Medicaid Managed Care   Unsuccessful Outreach Note  04/09/2023 Name: Natasha Pope MRN: [id number] DOB: November 19, 1995  Referred by: [name], MD Reason for referral : High Risk Managed Medicaid (MM Social work telephone outreach )   An unsuccessful telephone outreach was attempted today. The patient was referred to the case management team for assistance with care management and care coordination.   Follow Up Plan: A HIPAA compliant phone message was left for the patient providing contact information and requesting a return call.   Abelino Derrick, MHA [facility]  Managed [facility] Social Worker [phone]

## 2023-04-09 NOTE — Patient Outreach (Signed)
Medicaid Managed Care   Unsuccessful Outreach Note  04/09/2023 Name: Natasha Pope MRN: [id number] DOB: 23-May-1996  Referred by: [name], MD Reason for referral : High Risk Managed Medicaid (MM Social work telephone outreach )   An unsuccessful telephone outreach was attempted today. The patient was referred to the case management team for assistance with care management and care coordination.   Follow Up Plan: A HIPAA compliant phone message was left for the patient providing contact information and requesting a return call.   Abelino Derrick, MHA [facility]  Managed [facility] Social Worker [phone]

## 2023-04-10 ENCOUNTER — Ambulatory Visit: Payer: Medicaid Other | Admitting: Internal Medicine

## 2023-04-10 ENCOUNTER — Encounter: Payer: Self-pay | Admitting: Internal Medicine

## 2023-04-10 VITALS — BP 121/86 | HR 91 | Temp 96.5°F | Ht 65.0 in | Wt 199.6 lb

## 2023-04-10 DIAGNOSIS — Z87898 Personal history of other specified conditions: Secondary | ICD-10-CM | POA: Diagnosis not present

## 2023-04-10 DIAGNOSIS — R232 Flushing: Secondary | ICD-10-CM

## 2023-04-10 DIAGNOSIS — G2579 Other drug induced movement disorders: Secondary | ICD-10-CM | POA: Diagnosis not present

## 2023-04-10 NOTE — Patient Instructions (Addendum)
VISIT SUMMARY:  During your visit, we discussed your recent symptoms of hot flashes, itchiness, and nausea. We also talked about your ongoing recovery from substance abuse. You're doing well with your recovery and have been keeping busy with work and hobbies. You've been taking your medications as prescribed and have not introduced any new ones.  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 This might be due to the combination of medications you're taking. We'll stop your Trazodone medication and if your symptoms continue, we might reduce the dose of Lexapro or Auvelity. It's important to seek immediate medical attention if you experience severe symptoms like high fever, seizures, or changes in your mental state.  -ALCOHOL USE RECOVERY: You're doing well with your recovery. It's great that you're staying sober, even while working in a high-risk environment, and keeping busy with hobbies. We'll continue to support your recovery with your current medications (minus trazodone).  INSTRUCTIONS:  We'll follow up in 1-2 weeks to see how you're doing with the changes in your medication and to check on your recovery progress. If you have any concerns or if your symptoms worsen, please don't hesitate to reach out.   # Serotonin Syndrome: What You Need to Know  ## What is Serotonin Syndrome? Serotonin syndrome is a potentially serious drug reaction caused by excess serotonin in your body. It can occur when you take medications that increase serotonin levels, especially when starting new medications or increasing doses.  ## Your Current Medications You are currently taking: - Buspirone 15 mg - Auvelity 45-105 mg - Lexapro 10 mg - Trazodone 100 mg  These medications all have the potential to increase serotonin levels.  ## Symptoms to Watch For Mild  to moderate symptoms: - Agitation or restlessness - Confusion - Rapid heart rate and high blood pressure - Dilated pupils - Loss of muscle coordination or twitching muscles - Sweating - Shivering or goose bumps - Diarrhea - Headache  Severe symptoms (seek immediate medical attention): - High fever (above 103F / 39.4C) - Seizures - Irregular heartbeat - Unconsciousness  ## When to Go to the ER Seek emergency medical care immediately if you experience: - A combination of symptoms like agitation, confusion, rapid heart rate, and dilated pupils - Severe symptoms as listed above - Any sudden or severe change in your mental state, especially confusion or hallucinations  ## How to Reduce Your Medication Safely 1. Do not stop any medication abruptly without consulting your doctor. 2. Your doctor has advised starting by stopping trazodone. Follow these steps:    - Reduce your trazodone dose by 25 mg every 3-4 days    - Monitor your symptoms closely during this time    - If symptoms worsen, contact your doctor immediately  ## General Precautions - Never change your dosage or stop medications without consulting your doctor - Inform all healthcare providers about all medications you're taking - Avoid combining these medications with other serotonergic drugs or supplements without medical advice  Remember, this handout is for informational purposes only. Always consult with your healthcare provider for medical advice tailored to your specific situation.

## 2023-04-10 NOTE — Progress Notes (Signed)
[facility]: [phone]   -- Medical Office Visit --  Patient:  Natasha Pope      Age: 27 y.o.       Sex:  female  Date:   04/10/2023 Patient Care Team: [name], MD as PCP - General (Internal Medicine) Pucilowski, Roosvelt Maser, MD (Inactive) as Consulting Physician (Psychiatry) Today's Healthcare Provider: [name], MD      Assessment & Plan Serotonin syndrome She exhibits mild symptoms of serotonin syndrome, including hot flashes, itchiness, and nausea, likely due to the use of multiple serotoninergic medications such as Buspirone, Auvelity, Lexapro, and Trazodone. No severe symptoms like high fever, seizures, or mental status changes are present. We will discontinue Trazodone 100mg  and, if symptoms persist, consider reducing the dose of Lexapro or Auvelity. She has been provided with information on serotonin syndrome and advised to seek immediate medical attention if severe symptoms develop.  Extensive counseling and AVS education on serotonin syndrome was given. Hot flashes Suspicious for fevers of serotonin syndrome. History of alcohol use disorder She reports continued sobriety despite working in a high-risk environment for substance use and is engaging in hobbies while maintaining employment. We will encourage her continued sobriety and engagement in hobbies and continue current supportive medications.  We celebrated that she has found gainful employment while staying mindful of the risks  We will follow up in 1-2 weeks to assess her response to the medication changes and ongoing recovery progress.     Medical decision making included prescription drug management of trazodone and serotonin syndrome complications of treatment of severe depressive disorder(s) currently well controlled    Recommended follow-up: No follow-ups on file. Future Appointments  Date Time Provider Department Center  04/23/2023 11:00 AM [name],  [place] [facility] None  04/24/2023  3:40 PM [name], MD [facility]            Subjective   27 y.o. female who 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; Financial difficulties; Gastroenteritis; Contraceptive management; GERD (gastroesophageal reflux disease); Migraine; and Nausea on their problem list. Her reasons/main concerns/chief complaints for today's office visit are 3 week follow-up   ------------------------------------------------------------------------------------------------------------------------ AI-Extracted: Discussed the use of AI scribe software for clinical note transcription with the patient, who gave verbal consent to proceed.  History of Present Illness   The patient, who is in recovery from substance abuse, has been experiencing hot flashes and itchiness. She reports these symptoms have been occurring for a few days. She denies any recent illness or pregnancy, with a negative pregnancy test a few days prior. The patient's last menstrual period was a month ago. She also reports episodes of nausea and vomiting after meals, which she attributes to feeling overheated. She denies any other symptoms or changes in her health.  The patient has been working at [employer], which she reports has not interfered with her recovery. She has been keeping busy with work and hobbies, including reading and word searches. She denies any recent changes in  her medication regimen, which includes Buspirone, Compro, Auvelity, Lexapro, and Trazodone. She reports adherence  to her medication regimen and has not introduced any new medications.  The patient has been managing well emotionally and is in a good place with her recovery. She expresses satisfaction with her current medications, particularly Buspirone, Auvelity, and Lexapro. She is open to reducing her medication dosage to manage her symptoms, starting with Trazodone. She denies any recent use of Duloxetine.      She has a past medical history of Anxiety, Asthma, Depression, Gastroenteritis (10/22/2022), History of alcohol withdrawal delirium (07/04/2022), History of seizure due to alcohol withdrawal (02/28/2021), Memory loss (04/03/2022), Nausea vomiting and diarrhea (06/27/2022), Nicotine dependence due to vaping tobacco product (01/31/2018), 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 (04/17/2022), PTSD (post-traumatic stress disorder) (03/19/2022), Pyelonephritis, Seizure-like activity (HCC), Seizures (HCC), Sepsis (HCC), Snoring (04/17/2022), Thrombocytopenia (HCC) (05/19/2018), Unwanted pregnancy (11/26/2022), and Withdrawal syndrome (HCC) (09/27/2022).  Problem list overviews that were updated at today's visit: No problems updated. Current Outpatient Medications on File Prior to Visit  Medication Sig   acamprosate (CAMPRAL) 333 MG tablet Take 2 tablets (666 mg total) by mouth 3 (three) times daily with meals.   busPIRone (BUSPAR) 15 MG tablet Take 1 tablet (15 mg total) by mouth 3 (three) times daily. Must be taken consistently.   Dextromethorphan-buPROPion ER (AUVELITY) 45-105 MG TBCR Take 1 tablet by mouth 2 (two) times daily. To start, take just one tablet once daily in the morning. After 3 days, increase to the maximum recommended dosage of one tablet twice daily, given at least 8 hours apart. Do not exceed two doses within the same day.   escitalopram (LEXAPRO) 20 MG tablet Take 1 tablet (20 mg total)  by mouth daily.   methocarbamol (ROBAXIN) 500 MG tablet Take 1 tablet (500 mg total) by mouth 4 (four) times daily.   Multiple Vitamin (MULTIVITAMIN WITH MINERALS) TABS tablet Take 1 tablet by mouth daily.   norethindrone-ethinyl estradiol-FE (HAILEY FE 1/20) 1-20 MG-MCG tablet Take 1 tablet by mouth daily.   omeprazole (PRILOSEC) 20 MG capsule TAKE TWO CAPSULES BY MOUTH DAILY   ondansetron (ZOFRAN) 4 MG tablet Take 1 tablet (4 mg total) by mouth every 8 (eight) hours as needed for nausea or vomiting.   ondansetron (ZOFRAN-ODT) 4 MG disintegrating tablet Take 1 tablet (4 mg total) by mouth every 8 (eight) hours as needed for nausea or vomiting.   propranolol (INDERAL) 20 MG tablet Take 1 tablet (20 mg total) by mouth 3 (three) times daily.   QUEtiapine (SEROQUEL) 100 MG tablet Take 1 tablet (100 mg total) by mouth 2 (two) times daily. Take as 50 mg in morning, 150 mg in evening.  Stop duloxetine and trazodone. Ok to take with elevate.   rizatriptan (MAXALT) 5 MG tablet Take 5 mg by mouth as needed for migraine. May repeat in 2 hours if needed   thiamine (VITAMIN B-1) 100 MG tablet Take 1 tablet (100 mg total) by mouth daily.   topiramate (TOPAMAX) 25 MG tablet Take 2 tablets (50 mg total) by mouth in the morning, at noon, and at bedtime.   traZODone (DESYREL) 100 MG tablet Take 100 mg by mouth at bedtime.   [DISCONTINUED] Dextromethorphan HBr 15 MG TABS Take 3 tablets by mouth daily at 6 (six) AM.   No current facility-administered medications on file prior to visit.  There are no discontinued medications.   Objective   Physical Exam  BP 121/86 (BP Location: Left Arm, Patient Position: Sitting)   Pulse 91   Temp (!) 96.5 F (35.8 C) (Temporal)   Ht  5\' 5"  (1.651 m)   Wt 199 lb 9.6 oz (90.5 kg)   SpO2 98%   BMI 33.22 kg/m  Wt Readings from Last 10 Encounters:  04/10/23 199 lb 9.6 oz (90.5 kg)  03/20/23 200 lb (90.7 kg)  03/04/23 194 lb 3.2 oz (88.1 kg)  02/22/23 195 lb 6.4 oz (88.6 kg)   02/01/23 186 lb (84.4 kg)  01/21/23 195 lb 12.8 oz (88.8 kg)  01/18/23 188 lb 6 oz (85.4 kg)  12/11/22 194 lb 6.4 oz (88.2 kg)  11/26/22 191 lb (86.6 kg)  11/23/22 189 lb (85.7 kg)   Vital signs reviewed.  Nursing notes reviewed. Weight trend reviewed. Abnormalities and Problem-Specific physical exam findings:  calm relaxed  General Appearance:  No acute distress appreciable.   Well-groomed, healthy-appearing female.  Well proportioned with no abnormal fat distribution.  Good muscle tone. Pulmonary:  Normal work of breathing at rest, no respiratory distress apparent. SpO2: 98 %  Musculoskeletal: All extremities are intact.  Neurological:  Awake, alert, oriented, and engaged.  No obvious focal neurological deficits or cognitive impairments.  Sensorium seems unclouded.   Speech is clear and coherent with logical content. Psychiatric:  Appropriate mood, pleasant and cooperative demeanor, thoughtful and engaged during the exam  Results            No results found for any visits on 04/10/23.  Office Visit on 02/01/2023  Component Date Value   Rapid Strep A Screen 02/01/2023 Negative    SARS Coronavirus 2 Ag 02/01/2023 Negative    Glucose, Bld 02/01/2023 99    BUN 02/01/2023 8    Creat 02/01/2023 0.99 (H)    BUN/Creatinine Ratio 02/01/2023 8    Sodium 02/01/2023 134 (L)    Potassium 02/01/2023 3.0 (L)    Chloride 02/01/2023 90 (L)    CO2 02/01/2023 24    Calcium 02/01/2023 9.6    Total Protein 02/01/2023 8.2 (H)    Albumin 02/01/2023 5.0    Globulin 02/01/2023 3.2    AG Ratio 02/01/2023 1.6    Total Bilirubin 02/01/2023 0.9    Alkaline phosphatase (AP* 02/01/2023 149 (H)    AST 02/01/2023 107 (H)    ALT 02/01/2023 52 (H)    Hgb A1c MFr Bld 02/01/2023 5.1    Mean Plasma Glucose 02/01/2023 100    eAG (mmol/L) 02/01/2023 5.5   Office Visit on 08/29/2022  Component Date Value   High risk HPV 08/29/2022 Negative    Neisseria Gonorrhea 08/29/2022 Negative    Chlamydia  08/29/2022 Negative    Trichomonas 08/29/2022 Negative    Adequacy 08/29/2022 Satisfactory for evaluation; transformation zone component PRESENT.    Diagnosis 08/29/2022 - Negative for intraepithelial lesion or malignancy (NILM)    Microorganisms 08/29/2022 Shift in flora suggestive of bacterial vaginosis    Comment 08/29/2022 Normal Reference Range HPV - Negative    Comment 08/29/2022 Normal Reference Range Trichomonas - Negative    Comment 08/29/2022 Normal Reference Ranger Chlamydia - Negative    Comment 08/29/2022 Normal Reference Range Neisseria Gonorrhea - Negative    RPR Ser Ql 08/29/2022 NON-REACTIVE    HIV 1&2 Ab, 4th Generati* 08/29/2022 NON-REACTIVE    Hepatitis C Ab 08/29/2022 NON-REACTIVE   Office Visit on 08/03/2022  Component Date Value   SARS Coronavirus 2 Ag 08/03/2022 Negative    Influenza A, POC 08/03/2022 Negative    Influenza B, POC 08/03/2022 Negative    Rapid Strep A Screen 08/03/2022 Negative    Color, Urine 08/03/2022 ORANGE (A)  APPearance 08/03/2022 SL CLOUDY (A)    Specific Gravity, Urine 08/03/2022 1.015    pH 08/03/2022 8.0    Total Protein, Urine 08/03/2022 30 (A)    Urine Glucose 08/03/2022 NEGATIVE    Ketones, ur 08/03/2022 15 (A)    Bilirubin Urine 08/03/2022 SMALL (A)    Hgb urine dipstick 08/03/2022 NEGATIVE    Urobilinogen, UA 08/03/2022 2.0 (A)    Leukocytes,Ua 08/03/2022 NEGATIVE    Nitrite 08/03/2022 NEGATIVE    WBC, UA 08/03/2022 0-2/hpf    RBC / HPF 08/03/2022 0-2/hpf    Squamous Epithelial / HPF 08/03/2022 Many(>10/hpf) (A)    Bacteria, UA 08/03/2022 Few(10-50/hpf) (A)    Epithelial Casts, UA 08/03/2022 Presence of (A)    Lipase 08/03/2022 6 (L)    Glucose, Bld 08/03/2022 114 (H)    BUN 08/03/2022 4 (L)    Creat 08/03/2022 0.74    BUN/Creatinine Ratio 08/03/2022 5 (L)    Sodium 08/03/2022 135    Potassium 08/03/2022 3.4 (L)    Chloride 08/03/2022 97 (L)    CO2 08/03/2022 18 (L)    Calcium 08/03/2022 9.1    Total Protein  08/03/2022 7.9    Albumin 08/03/2022 4.5    Globulin 08/03/2022 3.4    AG Ratio 08/03/2022 1.3    Total Bilirubin 08/03/2022 0.7    Alkaline phosphatase (AP* 08/03/2022 69    AST 08/03/2022 21    ALT 08/03/2022 18    WBC 08/03/2022 12.4 (H)    RBC 08/03/2022 4.09    Hemoglobin 08/03/2022 13.7    HCT 08/03/2022 39.2    MCV 08/03/2022 95.8    MCH 08/03/2022 33.5 (H)    MCHC 08/03/2022 34.9    RDW 08/03/2022 15.6 (H)    Platelets 08/03/2022 285    MPV 08/03/2022 10.8    Amylase 08/03/2022 66   Admission on 06/26/2022, Discharged on 06/29/2022  Component Date Value   WBC 06/26/2022 7.0    RBC 06/26/2022 3.53 (L)    Hemoglobin 06/26/2022 12.8    HCT 06/26/2022 36.6    MCV 06/26/2022 103.7 (H)    MCH 06/26/2022 36.3 (H)    MCHC 06/26/2022 35.0    RDW 06/26/2022 17.6 (H)    Platelets 06/26/2022 203    nRBC 06/26/2022 0.6 (H)    Sodium 06/26/2022 136    Potassium 06/26/2022 2.9 (L)    Chloride 06/26/2022 94 (L)    CO2 06/26/2022 24    Glucose, Bld 06/26/2022 164 (H)    BUN 06/26/2022 6    Creatinine, Ser 06/26/2022 0.77    Calcium 06/26/2022 9.0    Total Protein 06/26/2022 7.3    Albumin 06/26/2022 4.3    AST 06/26/2022 65 (H)    ALT 06/26/2022 30    Alkaline Phosphatase 06/26/2022 86    Total Bilirubin 06/26/2022 1.5 (H)    GFR, Estimated 06/26/2022 >60    Anion gap 06/26/2022 18 (H)    Color, Urine 06/27/2022 AMBER (A)    APPearance 06/27/2022 HAZY (A)    Specific Gravity, Urine 06/27/2022 1.024    pH 06/27/2022 7.0    Glucose, UA 06/27/2022 NEGATIVE    Hgb urine dipstick 06/27/2022 NEGATIVE    Bilirubin Urine 06/27/2022 SMALL (A)    Ketones, ur 06/27/2022 20 (A)    Protein, ur 06/27/2022 100 (A)    Nitrite 06/27/2022 NEGATIVE    Leukocytes,Ua 06/27/2022 NEGATIVE    RBC / HPF 06/27/2022 0-5    WBC, UA 06/27/2022 0-5    Bacteria, UA 06/27/2022  NONE SEEN    Squamous Epithelial / HPF 06/27/2022 6-10    Mucus 06/27/2022 PRESENT    Hyaline Casts, UA 06/27/2022  PRESENT    Preg Test, Ur 06/27/2022 NEGATIVE    Alcohol, Ethyl (B) 06/26/2022 <10    Lactic Acid, Venous 06/26/2022 1.5    Opiates 06/27/2022 NONE DETECTED    Cocaine 06/27/2022 NONE DETECTED    Benzodiazepines 06/27/2022 POSITIVE (A)    Amphetamines 06/27/2022 NONE DETECTED    Tetrahydrocannabinol 06/27/2022 POSITIVE (A)    Barbiturates 06/27/2022 NONE DETECTED    HIV Screen 4th Generatio* 06/27/2022 Non Reactive    Magnesium 06/27/2022 1.5 (L)    Phosphorus 06/27/2022 3.2    WBC 06/27/2022 7.2    RBC 06/27/2022 3.27 (L)    Hemoglobin 06/27/2022 11.8 (L)    HCT 06/27/2022 34.0 (L)    MCV 06/27/2022 104.0 (H)    MCH 06/27/2022 36.1 (H)    MCHC 06/27/2022 34.7    RDW 06/27/2022 17.8 (H)    Platelets 06/27/2022 186    nRBC 06/27/2022 0.8 (H)    Sodium 06/27/2022 135    Potassium 06/27/2022 3.3 (L)    Chloride 06/27/2022 94 (L)    CO2 06/27/2022 26    Glucose, Bld 06/27/2022 118 (H)    BUN 06/27/2022 6    Creatinine, Ser 06/27/2022 0.77    Calcium 06/27/2022 8.7 (L)    Total Protein 06/27/2022 6.7    Albumin 06/27/2022 4.2    AST 06/27/2022 46 (H)    ALT 06/27/2022 25    Alkaline Phosphatase 06/27/2022 76    Total Bilirubin 06/27/2022 1.3 (H)    GFR, Estimated 06/27/2022 >60    Anion gap 06/27/2022 15    Sodium 06/28/2022 137    Potassium 06/28/2022 2.5 (LL)    Chloride 06/28/2022 99    CO2 06/28/2022 26    Glucose, Bld 06/28/2022 93    BUN 06/28/2022 <5 (L)    Creatinine, Ser 06/28/2022 0.71    Calcium 06/28/2022 9.2    Total Protein 06/28/2022 6.6    Albumin 06/28/2022 4.0    AST 06/28/2022 51 (H)    ALT 06/28/2022 26    Alkaline Phosphatase 06/28/2022 72    Total Bilirubin 06/28/2022 1.2    GFR, Estimated 06/28/2022 >60    Anion gap 06/28/2022 12    WBC 06/28/2022 5.8    RBC 06/28/2022 3.47 (L)    Hemoglobin 06/28/2022 12.6    HCT 06/28/2022 36.6    MCV 06/28/2022 105.5 (H)    MCH 06/28/2022 36.3 (H)    MCHC 06/28/2022 34.4    RDW 06/28/2022 18.1 (H)     Platelets 06/28/2022 133 (L)    nRBC 06/28/2022 0.7 (H)    Magnesium 06/28/2022 2.0    Sodium 06/28/2022 138    Potassium 06/28/2022 5.8 (H)    Chloride 06/28/2022 107    CO2 06/28/2022 19 (L)    Glucose, Bld 06/28/2022 99    BUN 06/28/2022 <5 (L)    Creatinine, Ser 06/28/2022 1.22 (H)    Calcium 06/28/2022 9.5    GFR, Estimated 06/28/2022 >60    Anion gap 06/28/2022 12    Sodium 06/29/2022 139    Potassium 06/29/2022 3.6    Chloride 06/29/2022 105    CO2 06/29/2022 22    Glucose, Bld 06/29/2022 88    BUN 06/29/2022 <5 (L)    Creatinine, Ser 06/29/2022 0.69    Calcium 06/29/2022 9.2    GFR, Estimated 06/29/2022 >60    Anion  gap 06/29/2022 12    Magnesium 06/29/2022 1.7    TSH 06/29/2022 5.813 (H)   Office Visit on 06/01/2022  Component Date Value   Preg Test, Ur 06/01/2022 Negative    SARS Coronavirus 2 Ag 06/01/2022 Negative    Influenza A, POC 06/01/2022 Negative    Influenza B, POC 06/01/2022 Negative    No image results found.   No results found.  No results found.     Additional Info: This encounter employed real-time, collaborative documentation. The patient actively reviewed and updated their medical record on a shared screen, ensuring transparency and facilitating joint problem-solving for the problem list, overview, and plan. This approach promotes accurate, informed care. The treatment plan was discussed and reviewed in detail, including medication safety, potential side effects, and all patient questions. We confirmed understanding and comfort with the plan. Follow-up instructions were established, including contacting the office for any concerns, returning if symptoms worsen, persist, or new symptoms develop, and precautions for potential emergency department visits.

## 2023-04-15 ENCOUNTER — Telehealth: Payer: Self-pay | Admitting: Licensed Clinical Social Worker

## 2023-04-15 NOTE — Patient Outreach (Signed)
Care Coordination  04/15/2023  Natasha Pope Jan 27, 1996 [id number]  [facility] LCSW received incoming email from patient's grandmother requesting resource connection and information. This was successfully provided to her by email today on 04/15/23. Family reports interest in [place] treatment.   Dickie La, BSW, MSW, [employer] LCSW [facility]  [facility] [email or web address] Phone: [phone]

## 2023-04-22 ENCOUNTER — Other Ambulatory Visit: Payer: Self-pay | Admitting: Internal Medicine

## 2023-04-22 DIAGNOSIS — K219 Gastro-esophageal reflux disease without esophagitis: Secondary | ICD-10-CM

## 2023-04-23 ENCOUNTER — Other Ambulatory Visit: Payer: Medicaid Other | Admitting: Licensed Clinical Social Worker

## 2023-04-23 NOTE — Patient Instructions (Signed)
Visit Information  Natasha Pope was given information about Medicaid Managed Care team care coordination services as a part of their [employer] Medicaid benefit. Natasha Pope verbally consentedto engagement with the [facility] team.   If you are experiencing a medical emergency, please call 911 or report to your local emergency department or urgent care.   If you have a non-emergency medical problem during routine business hours, please contact your provider's office and ask to speak with a nurse.   For questions related to your [employer] [facility] health plan, please call: [phone]  OR visit the member homepage at: [email or web address]  If you would like to schedule transportation through your [facility] plan, please call the following number at least 2 days in advance of your appointment: [phone]  If you are experiencing a behavioral health crisis, call the [facility] Crisis Line at [phone] 8202397896). The line is available 24 hours a day, seven days a week.  If you would like help to quit smoking, call 1-800-QUIT-NOW (216-848-3508) OR Espaol: 1-855-Djelo-Ya (6-644-034-7425) o para ms informacin haga clic aqu or Text READY to 956-387 to register via text  Following is a copy of your plan of care:  Care Plan : LCSW Plan of Care  Updates made by [name], LCSW since 04/23/2023 12:00 AM     Problem: Anxiety Identification (Anxiety)      Goal: Anxiety Symptoms Identified   Note:   Timeframe:  Short-Range Goal Priority:  High Start Date:   03/05/23             Expected End Date:  ongoing                     Follow Up Date--05/16/23 at 915 am  - keep 90 percent of scheduled appointments -consider counseling or psychiatry -consider bumping up your self-care  -consider creating a stronger support network   Why  is this important?             Combatting depression may take some time.            If you don't feel better right away, don't give up on your treatment plan.    Current barriers:   Chronic Mental Health needs related to PTSD, alcohol-use with alcohol induced mood disorder, stress and anxiety. Patient requires Support, Education, Resources, Referrals, Advocacy, and Care Coordination, in order to meet Unmet Mental Health Needs. (To Find a Psychiatrist, pt unsure is she wants therapy) Patient will implement clinical interventions discussed today to decrease symptoms of depression and increase knowledge and/or ability of: coping skills. Mental Health Concerns and Social Isolation Patient lacks knowledge of available community counseling agencies and resources.  Clinical Goal(s): verbalize understanding of plan for management of Anxiety, Alcohol Use Disorder, Depression, and Stress and demonstrate a reduction in symptoms. Patient will connect with a provider for ongoing mental health treatment, increase coping skills, healthy habits, self-management skills, and stress reduction        Patient Goals/Self-Care Activities: Take medications as prescribed   Attend all scheduled provider appointments Call pharmacy for medication refills 3-7 days in advance of running out of medications Perform all self care activities independently  Perform IADL's (shopping, preparing meals, housekeeping, managing finances) independently Call provider office for new concerns or questions Work with the social worker to address care coordination needs and will continue to work with the clinical team to address health care and disease management  related needs call 1-800-273-TALK (toll free, 24 hour hotline) go to [facility] [address] [place] [phone]) call 911 if experiencing a Mental Health or Behavioral Health Crisis  Utilize healthy coping skills and supportive  resources discussed Contact PCP with any questions or concerns Keep 90 percent of counseling appointments Call your insurance provider for more information about your Enhanced Benefits  Check out counseling resources provided  Begin personal counseling with LCSW, to reduce and manage symptoms of Depression and Stress, until well-established with mental health provider Accept all calls from representative with [facility] in an effort to establish ongoing mental health counseling and supportive services. Incorporate into daily practice - relaxation techniques, deep breathing exercises, and mindfulness meditation strategies. Talk about feelings with friends, family members, spiritual advisor, etc. Contact LCSW directly [phone]), if you have questions, need assistance, or if additional social work needs are identified between now and our next scheduled telephone outreach call. Call 988 for mental health hotline/crisis line if needed (24/7 available) Try techniques to reduce symptoms of anxiety/negative thinking (deep breathing, distraction, positive self talk, etc)  - develop a personal safety plan - develop a plan to deal with triggers like holidays, anniversaries - exercise at least 2 to 3 times per week - have a plan for how to handle bad days - journal feelings and what helps to feel better or worse - spend time or talk with others at least 2 to 3 times per week - watch for early signs of feeling worse - begin personal counseling - call and visit an old friend - check out volunteer opportunities - join a support group - laugh; watch a funny movie or comedian - learn and use visualization or guided imagery - perform a random act of kindness - practice relaxation or meditation daily - start or continue a personal journal - practice positive thinking and self-talk -continue with compliance of taking medication  -identify current effective and ineffective coping strategies.  -implement  positive self-talk in care to increase self-esteem, confidence and feelings of control.  -consider alternative and complementary therapy approaches such as meditation, mindfulness or yoga.  Call your insurance provider to gain education on benefits if desired Call your primary care doctor if symptoms get worse  -journaling, prayer, worship services, meditation or pastoral counseling.  -increase participation in pleasurable group activities such as hobbies, singing, sports or volunteering).  -consider the use of meditative movement therapy such as tai chi, yoga or qigong.  -start a regular daily exercise program based on tolerance, ability and patient choice to support positive thinking and activity    Follow Up Plan:  The patient has been provided with contact information for the care management team and has been advised to call with any mental health or health related questions or concerns.  The care management team will reach out to the patient again over the next 30 business  days.   If you are experiencing a Mental Health or Behavioral Health Crisis or need someone to talk to, please call the Suicide and Crisis Lifeline: 988    Patient Goals: Follow up goal   Dickie La, BSW, MSW, [employer] Managed Medicaid LCSW [facility]  [facility] [email or web address] Phone: [phone]

## 2023-04-23 NOTE — Patient Outreach (Signed)
Medicaid Managed Care Social Work Note  04/23/2023 Name:  Natasha Pope MRN:  [id number] DOB:  12-29-1995  Natasha Pope is an 27 y.o. year old female who is a primary patient of Natasha Olszewski, MD.  The Medicaid Managed Care Coordination team was consulted for assistance with:  Mental Health Counseling and Resources  Natasha Pope was given information about Medicaid Managed Care Coordination team services today. Natasha Pope Patient agreed to services and verbal consent obtained.  Engaged with patient  for by telephone forfollow up visit in response to referral for case management and/or care coordination services.   Assessments/Interventions:  Review of past medical history, allergies, medications, health status, including review of consultants reports, laboratory and other test data, was performed as part of comprehensive evaluation and provision of chronic care management services.  SDOH: (Social Determinant of Health) assessments and interventions performed: SDOH Interventions    Flowsheet Row Patient Outreach Telephone from 04/23/2023 in [place] POPULATION HEALTH DEPARTMENT Patient Outreach Telephone from 04/04/2023 in [place] POPULATION HEALTH DEPARTMENT Patient Outreach Telephone from 03/19/2023 in [place] POPULATION HEALTH DEPARTMENT Patient Outreach Telephone from 03/05/2023 in  POPULATION HEALTH DEPARTMENT Office Visit from 01/18/2023 in Natasha Pope Video Visit from 02/13/2021 in [facility] [employer] at [employer]  SDOH Interventions        Transportation Interventions Intervention Not Indicated -- -- -- -- --  Depression Interventions/Treatment  -- -- -- -- Currently on Treatment Referral to Psychiatry, Medication, Counseling, Currently on Treatment  Stress Interventions Offered [employer], Provide Counseling Provide Counseling, Offered [employer] Wellness Resources Offered  [employer], Provide Counseling  [Due to financial struggles and loss of food stamps] [employer], Provide Counseling -- --       Advanced Directives Status:  See Care Plan for related entries.  Care Plan                 Allergies  Allergen Reactions   Lamotrigine Hives    Medications Reviewed Today   Medications were not reviewed in this encounter     Patient Active Problem List   Diagnosis Date Noted   Migraine 03/04/2023   Nausea 03/04/2023   Contraceptive management 01/21/2023   GERD (gastroesophageal reflux disease) 01/21/2023   Gastroenteritis 10/22/2022   Financial difficulties 10/04/2022   Snoring 04/17/2022   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 04/17/2022   Memory loss 04/03/2022   PTSD (post-traumatic stress disorder) 03/19/2022   Auditory hallucinations 01/24/2021   Visual hallucination 01/24/2021   Chronic abdominal pain 01/24/2021   Delusional disorder (HCC)    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/2019   Alcohol use disorder, severe, in early remission, dependence (HCC) 12/27/2018   Recurrent major depressive disorder (HCC) 12/27/2018   Alcohol use with alcohol-induced mood disorder (HCC)    Substance induced mood disorder (HCC) 12/09/2018   Macrocytosis 05/19/2018   ASCUS of cervix with negative high risk HPV 03/07/2018   Nicotine dependence due to vaping tobacco product 01/31/2018   Asthma 01/31/2018   GAD (generalized anxiety disorder) 01/31/2018    Conditions to be addressed/monitored per PCP order:  Anxiety  Care Plan : LCSW Plan of Care  Updates made by Natasha Bryant, LCSW since 04/23/2023 12:00 AM     Problem: Anxiety Identification (Anxiety)      Goal: Anxiety Symptoms Identified   Note:   Timeframe:  Short-Range Goal Priority:  High Start Date:   03/05/23  Expected End Date:  ongoing                     Follow Up Date--05/16/23 at 915 am  - keep 90 percent of scheduled  appointments -consider counseling or psychiatry -consider bumping up your self-care  -consider creating a stronger support network   Why is this important?             Combatting depression may take some time.            If you don't feel better right away, don't give up on your treatment plan.    Current barriers:   Chronic Mental Health needs related to PTSD, alcohol-use with alcohol induced mood disorder, stress and anxiety. Patient requires Support, Education, Resources, Referrals, Advocacy, and Care Coordination, in order to meet Unmet Mental Health Needs. (To Find a Psychiatrist, pt unsure is she wants therapy) Patient will implement clinical interventions discussed today to decrease symptoms of depression and increase knowledge and/or ability of: coping skills. Mental Health Concerns and Social Isolation Patient lacks knowledge of available community counseling agencies and resources.  Clinical Goal(s): verbalize understanding of plan for management of Anxiety, Alcohol Use Disorder, Depression, and Stress and demonstrate a reduction in symptoms. Patient will connect with a provider for ongoing mental health treatment, increase coping skills, healthy habits, self-management skills, and stress reduction        Clinical Interventions:  Assessed patient's previous and current treatment, coping skills, support system and barriers to care. Patient provided hx. Patient's main support is her grandmother who lives in [place] and comes to visit her and physically check on her in the home every 2-3 weeks.  Verbalization of feelings encouraged, motivational interviewing employed Emotional support provided, positive coping strategies explored. Establishing healthy boundaries emphasized and healthy self-care education provided Patient was educated on available mental health resources within their area that accept Medicaid and offer psychiatry. Patient is unsure if she wishes to get therapy at this time.   Patient educated on the difference between therapy and psychiatry per patient request Email sent to patient today with available mental health resources within her area that accept Medicaid and offer the services that she is interested in. Email included instructions for scheduling at Natasha Pope as well as some crisis support resources and [facility]'s walk in clinic hours. Patient will review resources over the next two weeks and make a decision regarding where she wishes to gain MH treatment at. Emotional support provided. CBT intervention implemented regarding "being mentally fit" by combating negative thinking and replacing it with uplifting support, hope and positivity. Patient reports that her grandmother feels that her current psychiatric medications need adjusting.  Patient admits to ongoing alcohol usage and was educated on the mental and physical consequences of this negative coping mechanism. Assessed social determinant of health barriers Patient admits to PTSD. LCSW provided education on relaxation techniques such as meditation, deep breathing, massage, grounding exercises or yoga that can activate the body's relaxation response and ease symptoms of stress and anxiety. LCSW ask that when pt is struggling with difficult emotions and racing thoughts that they start this relaxation response process. LCSW provided extensive education on healthy coping skills for anxiety. SW used active and reflective listening, validated patient's feelings/concerns, and provided emotional support. Patient will work on implementing appropriate self-care habits into their daily routine such as: staying positive, writing a gratitude list, drinking water, staying active around the house, taking their medications as prescribed, combating negative thoughts or emotions  and staying connected with their family and friends. Positive reinforcement provided for this decision to work on this.  Motivational Interviewing  employed Depression screen reviewed  PHQ2/ PHQ9 completed or reviewed  Mindfulness or Relaxation training provided Active listening / Reflection utilized  Advance Care and HCPOA education provided Emotional Support Provided Problem Solving /Task Pope strategies reviewed Provided psychoeducation for mental health needs  Provided brief CBT  Reviewed mental health medications and discussed importance of compliance:  Quality of sleep assessed & Sleep Hygiene techniques promoted  Participation in counseling encouraged  Verbalization of feelings encouraged  Suicidal Ideation/Homicidal Ideation assessed: Patient denies SI/HI  Review resources, discussed options and provided patient information about  Mental Health Resources Inter-disciplinary care team collaboration (see longitudinal plan of care) Update- Patient reports that she did not successfully receive resources from either [facility] LCSW and BSW. Patient reports having issues with that email address so provided a new one, [email or web address]. [facility] LCSW re-sent resources to new email addressed and will update [facility] BSW upon her return back to the office to suggest doing the same. Patient reports that her stress has increased due to financial stressors and loss of her food stamps. Brief self-care and emotional support provided to patient. Patient was advised to please contact [facility] LCSW directly if she is unable to retrieve re-sent resources. Patient called back later in the day asking for transportation resources and her insurance does provide transportation through [employer]. She physically wrote down their number which is [phone]. 04/04/23 update-Patient, grandmother and [facility] LCSW completed joint phone call together. Patient reports having psychiatry and counselor already in place but does not know the agency name and neither does grandmother. She reports that she will retrieve this information successfully. Grandmother reports that she  believes patient only has a Veterinary surgeon and they would like to review the resources again to find a agency that has both counseling and psychiatry. Email sent to grandmother with requested [facility] resources. Family will meet together this weekend and then chose an agency that would like [facility] LCSW to place a referral during next outreach. Per recent PCP visit, she reports continued sobriety with two past slips but none recently. [facility] LCSW and PCP discussed with patient the importance of maintaining sobriety and the risk of late relapse. She was encouraged to continue using a sobriety tracking app and participating in a 12-step program. [facility] LCSW informed grandmother that PCP is recommending ongoing 12 step program involvement and for patient to connect with a sponsor which could be beneficial for her continued recovery. Grandmother expressed understanding. [facility] LCSW will follow up within 30 days. Update- [facility] LCSW, grandmother and patient made joint call together to [facility] and successfully signed her up for services and for her first psychiatry appointment on 05/16/23 at 5 pm. Patient is responsible for completing her intake paperwork 48 hours prior to this appointment.   Patient Goals/Self-Care Activities: Take medications as prescribed   Attend all scheduled provider appointments Call pharmacy for medication refills 3-7 days in advance of running out of medications Perform all self care activities independently  Perform IADL's (shopping, preparing meals, housekeeping, managing finances) independently Call provider office for new concerns or questions Work with the social worker to address care coordination needs and will continue to work with the clinical team to address health care and disease management related needs call 1-800-273-TALK (toll free, 24 hour hotline) go to [facility] [address] [place] [phone]) call 911 if experiencing a Mental Health or  Behavioral  Health Crisis  Utilize healthy coping skills and supportive resources discussed Contact PCP with any questions or concerns Keep 90 percent of counseling appointments Call your insurance provider for more information about your Enhanced Benefits  Check out counseling resources provided  Begin personal counseling with LCSW, to reduce and manage symptoms of Depression and Stress, until well-established with mental health provider Accept all calls from representative with [facility] in an effort to establish ongoing mental health counseling and supportive services. Incorporate into daily practice - relaxation techniques, deep breathing exercises, and mindfulness meditation strategies. Talk about feelings with friends, family members, spiritual advisor, etc. Contact LCSW directly [phone]), if you have questions, need assistance, or if additional social work needs are identified between now and our next scheduled telephone outreach call. Call 988 for mental health hotline/crisis line if needed (24/7 available) Try techniques to reduce symptoms of anxiety/negative thinking (deep breathing, distraction, positive self talk, etc)  - develop a personal safety plan - develop a plan to deal with triggers like holidays, anniversaries - exercise at least 2 to 3 times per week - have a plan for how to handle bad days - journal feelings and what helps to feel better or worse - spend time or talk with others at least 2 to 3 times per week - watch for early signs of feeling worse - begin personal counseling - call and visit an old friend - check out volunteer opportunities - join a support group - laugh; watch a funny movie or comedian - learn and use visualization or guided imagery - perform a random act of kindness - practice relaxation or meditation daily - start or continue a personal journal - practice positive thinking and self-talk -continue with compliance of taking medication  -identify  current effective and ineffective coping strategies.  -implement positive self-talk in care to increase self-esteem, confidence and feelings of control.  -consider alternative and complementary therapy approaches such as meditation, mindfulness or yoga.  Call your insurance provider to gain education on benefits if desired Call your primary care doctor if symptoms get worse  -journaling, prayer, worship services, meditation or pastoral counseling.  -increase participation in pleasurable group activities such as hobbies, singing, sports or volunteering).  -consider the use of meditative movement therapy such as tai chi, yoga or qigong.  -start a regular daily exercise program based on tolerance, ability and patient choice to support positive thinking and activity    Follow Up Plan:  The patient has been provided with contact information for the care management team and has been advised to call with any mental health or health related questions or concerns.  The care management team will reach out to the patient again over the next 30 business  days.   If you are experiencing a Mental Health or Behavioral Health Crisis or need someone to talk to, please call the Suicide and Crisis Lifeline: 988    Patient Goals: Follow up goal        Follow up:  Patient agrees to Care Plan and Follow-up.  Plan: The Managed Medicaid care management team will reach out to the patient again over the next 30 days.  Dickie La, BSW, MSW, [employer] Managed Medicaid LCSW [facility]  [facility] [email or web address] Phone: [phone]

## 2023-04-24 ENCOUNTER — Ambulatory Visit: Payer: Medicaid Other | Admitting: Internal Medicine

## 2023-05-09 ENCOUNTER — Ambulatory Visit: Payer: Medicaid Other | Admitting: Internal Medicine

## 2023-05-15 ENCOUNTER — Other Ambulatory Visit: Payer: Self-pay | Admitting: Licensed Clinical Social Worker

## 2023-05-15 NOTE — Patient Instructions (Signed)
Visit Information  Natasha Pope was given information about Medicaid Managed Care team care coordination services as a part of their [employer] Medicaid benefit. Natasha Pope verbally consentedto engagement with the [facility] team.   If you are experiencing a medical emergency, please call 911 or report to your local emergency department or urgent care.   If you have a non-emergency medical problem during routine business hours, please contact your provider's office and ask to speak with a nurse.   For questions related to your [employer] [facility] health plan, please call: [phone]  OR visit the member homepage at: [email or web address]  If you would like to schedule transportation through your [facility] plan, please call the following number at least 2 days in advance of your appointment: [phone]  If you are experiencing a behavioral health crisis, call the [facility] Crisis Line at [phone]). The line is available 24 hours a day, seven days a week.  If you would like help to quit smoking, call 1-800-QUIT-NOW ((782)831-2344) OR Espaol: 1-855-Djelo-Ya (6-644-034-7425) o para ms informacin haga clic aqu or Text READY to 956-387 to register via text  Following is a copy of your plan of care:  Care Plan : LCSW Plan of Care  Updates made by Natasha Bryant, LCSW since 05/15/2023 12:00 AM     Problem: Anxiety Identification (Anxiety)      Goal: Anxiety Symptoms Identified   Note:   Timeframe:  Short-Range Goal Priority:  High Start Date:   03/05/23             Expected End Date:  ongoing                     Follow Up Date--05/27/23 at 1 pm  - keep 90 percent of scheduled appointments -consider counseling or psychiatry -consider bumping up your self-care  -consider creating a stronger support network   Why  is this important?             Combatting depression may take some time.            If you don't feel better right away, don't give up on your treatment plan.    Current barriers:   Chronic Mental Health needs related to PTSD, alcohol-use with alcohol induced mood disorder, stress and anxiety. Patient requires Support, Education, Resources, Referrals, Advocacy, and Care Coordination, in order to meet Unmet Mental Health Needs. (To Find a Psychiatrist, pt unsure is she wants therapy) Patient will implement clinical interventions discussed today to decrease symptoms of depression and increase knowledge and/or ability of: coping skills. Mental Health Concerns and Social Isolation Patient lacks knowledge of available community counseling agencies and resources.  Clinical Goal(s): verbalize understanding of plan for management of Anxiety, Alcohol Use Disorder, Depression, and Stress and demonstrate a reduction in symptoms. Patient will connect with a provider for ongoing mental health treatment, increase coping skills, healthy habits, self-management skills, and stress reduction        Patient Goals/Self-Care Activities: Take medications as prescribed   Attend all scheduled provider appointments Call pharmacy for medication refills 3-7 days in advance of running out of medications Perform all self care activities independently  Perform IADL's (shopping, preparing meals, housekeeping, managing finances) independently Call provider office for new concerns or questions Work with the social worker to address care coordination needs and will continue to work with the clinical team to address health care and disease management  related needs call 1-800-273-TALK (toll free, 24 hour hotline) go to [facility] [address], [place] [phone]) call 911 if experiencing a Mental Health or Behavioral Health Crisis  Utilize healthy coping skills and supportive  resources discussed Contact PCP with any questions or concerns Keep 90 percent of counseling appointments Call your insurance provider for more information about your Enhanced Benefits  Check out counseling resources provided  Begin personal counseling with LCSW, to reduce and manage symptoms of Depression and Stress, until well-established with mental health provider Accept all calls from representative with [facility] in an effort to establish ongoing mental health counseling and supportive services. Incorporate into daily practice - relaxation techniques, deep breathing exercises, and mindfulness meditation strategies. Talk about feelings with friends, family members, spiritual advisor, etc. Contact LCSW directly [phone]), if you have questions, need assistance, or if additional social work needs are identified between now and our next scheduled telephone outreach call. Call 988 for mental health hotline/crisis line if needed (24/7 available) Try techniques to reduce symptoms of anxiety/negative thinking (deep breathing, distraction, positive self talk, etc)  - develop a personal safety plan - develop a plan to deal with triggers like holidays, anniversaries - exercise at least 2 to 3 times per week - have a plan for how to handle bad days - journal feelings and what helps to feel better or worse - spend time or talk with others at least 2 to 3 times per week - watch for early signs of feeling worse - begin personal counseling - call and visit an old friend - check out volunteer opportunities - join a support group - laugh; watch a funny movie or comedian - learn and use visualization or guided imagery - perform a random act of kindness - practice relaxation or meditation daily - start or continue a personal journal - practice positive thinking and self-talk -continue with compliance of taking medication  -identify current effective and ineffective coping strategies.  -implement  positive self-talk in care to increase self-esteem, confidence and feelings of control.  -consider alternative and complementary therapy approaches such as meditation, mindfulness or yoga.  Call your insurance provider to gain education on benefits if desired Call your primary care doctor if symptoms get worse  -journaling, prayer, worship services, meditation or pastoral counseling.  -increase participation in pleasurable group activities such as hobbies, singing, sports or volunteering).  -consider the use of meditative movement therapy such as tai chi, yoga or qigong.  -start a regular daily exercise program based on tolerance, ability and patient choice to support positive thinking and activity    Follow Up Plan:  The patient has been provided with contact information for the care management team and has been advised to call with any mental health or health related questions or concerns.  The care management team will reach out to the patient again over the next 30 business  days.   If you are experiencing a Mental Health or Behavioral Health Crisis or need someone to talk to, please call the Suicide and Crisis Lifeline: 988    Patient Goals: Follow up goal   Dickie La, BSW, MSW, [employer] Managed Medicaid LCSW [facility]  [facility] [email or web address] Phone: [phone]

## 2023-05-15 NOTE — Patient Outreach (Signed)
Medicaid Managed Care Social Work Note  05/15/2023 Name:  Natasha Pope MRN:  [id number] DOB:  October 03, 1995  Natasha Pope is an 27 y.o. year old female who is a primary patient of [name], MD.  The Medicaid Managed Care Coordination team was consulted for assistance with:  Mental Health Counseling and Resources  Ms. Chaisson was given information about Medicaid Managed Care Coordination team services today. [name] Patient agreed to services and verbal consent obtained.  Engaged with patient  for by telephone forfollow up visit in response to referral for case management and/or care coordination services.   Assessments/Interventions:  Review of past medical history, allergies, medications, health status, including review of consultants reports, laboratory and other test data, was performed as part of comprehensive evaluation and provision of chronic care management services.  SDOH: (Social Determinant of Health) assessments and interventions performed: SDOH Interventions    Flowsheet Row Patient Outreach Telephone from 05/15/2023 in [place] POPULATION HEALTH DEPARTMENT Patient Outreach Telephone from 04/23/2023 in [place] POPULATION HEALTH DEPARTMENT Patient Outreach Telephone from 04/04/2023 in [place] POPULATION HEALTH DEPARTMENT Patient Outreach Telephone from 03/19/2023 in [place] POPULATION HEALTH DEPARTMENT Patient Outreach Telephone from 03/05/2023 in [place] POPULATION HEALTH DEPARTMENT Office Visit from 01/18/2023 in [facility]  SDOH Interventions        Transportation Interventions -- Intervention Not Indicated -- -- -- --  Depression Interventions/Treatment  -- -- -- -- -- Currently on Treatment  Stress Interventions Offered [employer], Provide Counseling Offered [employer], Provide Counseling Provide Counseling, Offered Community Wellness Resources Offered  [employer], Provide Counseling  [Due to financial struggles and loss of food stamps] [employer], Provide Counseling --       Advanced Directives Status:  See Care Plan for related entries.  Care Plan                 Allergies  Allergen Reactions   Lamotrigine Hives    Medications Reviewed Today   Medications were not reviewed in this encounter     Patient Active Problem List   Diagnosis Date Noted   Migraine 03/04/2023   Nausea 03/04/2023   Contraceptive management 01/21/2023   GERD (gastroesophageal reflux disease) 01/21/2023   Gastroenteritis 10/22/2022   Financial difficulties 10/04/2022   Snoring 04/17/2022   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 04/17/2022   Memory loss 04/03/2022   PTSD (post-traumatic stress disorder) 03/19/2022   Auditory hallucinations 01/24/2021   Visual hallucination 01/24/2021   Chronic abdominal pain 01/24/2021   Delusional disorder (HCC)    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/2019   Alcohol use disorder, severe, in early remission, dependence (HCC) 12/27/2018   Recurrent major depressive disorder (HCC) 12/27/2018   Alcohol use with alcohol-induced mood disorder (HCC)    Substance induced mood disorder (HCC) 12/09/2018   Macrocytosis 05/19/2018   ASCUS of cervix with negative high risk HPV 03/07/2018   Nicotine dependence due to vaping tobacco product 01/31/2018   Asthma 01/31/2018   GAD (generalized anxiety disorder) 01/31/2018    Conditions to be addressed/monitored per PCP order:  Anxiety  Care Plan : LCSW Plan of Care  Updates made by [name], LCSW since 05/15/2023 12:00 AM     Problem: Anxiety Identification (Anxiety)      Goal: Anxiety Symptoms Identified   Note:   Timeframe:  Short-Range Goal Priority:  High Start Date:   03/05/23  Expected End Date:  ongoing                     Follow Up Date--05/27/23 at 1 pm  - keep 90 percent of scheduled  appointments -consider counseling or psychiatry -consider bumping up your self-care  -consider creating a stronger support network   Why is this important?             Combatting depression may take some time.            If you don't feel better right away, don't give up on your treatment plan.    Current barriers:   Chronic Mental Health needs related to PTSD, alcohol-use with alcohol induced mood disorder, stress and anxiety. Patient requires Support, Education, Resources, Referrals, Advocacy, and Care Coordination, in order to meet Unmet Mental Health Needs. (To Find a Psychiatrist, pt unsure is she wants therapy) Patient will implement clinical interventions discussed today to decrease symptoms of depression and increase knowledge and/or ability of: coping skills. Mental Health Concerns and Social Isolation Patient lacks knowledge of available community counseling agencies and resources.  Clinical Goal(s): verbalize understanding of plan for management of Anxiety, Alcohol Use Disorder, Depression, and Stress and demonstrate a reduction in symptoms. Patient will connect with a provider for ongoing mental health treatment, increase coping skills, healthy habits, self-management skills, and stress reduction        Clinical Interventions:  Assessed patient's previous and current treatment, coping skills, support system and barriers to care. Patient provided hx. Patient's main support is her grandmother who lives in [place] and comes to visit her and physically check on her in the home every 2-3 weeks.  Verbalization of feelings encouraged, motivational interviewing employed Emotional support provided, positive coping strategies explored. Establishing healthy boundaries emphasized and healthy self-care education provided Patient was educated on available mental health resources within their area that accept Medicaid and offer psychiatry. Patient is unsure if she wishes to get therapy at this time.   Patient educated on the difference between therapy and psychiatry per patient request Email sent to patient today with available mental health resources within her area that accept Medicaid and offer the services that she is interested in. Email included instructions for scheduling at [facility] as well as some crisis support resources and [facility]'s walk in clinic hours. Patient will review resources over the next two weeks and make a decision regarding where she wishes to gain MH treatment at. Emotional support provided. CBT intervention implemented regarding "being mentally fit" by combating negative thinking and replacing it with uplifting support, hope and positivity. Patient reports that her grandmother feels that her current psychiatric medications need adjusting.  Patient admits to ongoing alcohol usage and was educated on the mental and physical consequences of this negative coping mechanism. Assessed social determinant of health barriers Patient admits to PTSD. LCSW provided education on relaxation techniques such as meditation, deep breathing, massage, grounding exercises or yoga that can activate the body's relaxation response and ease symptoms of stress and anxiety. LCSW ask that when pt is struggling with difficult emotions and racing thoughts that they start this relaxation response process. LCSW provided extensive education on healthy coping skills for anxiety. SW used active and reflective listening, validated patient's feelings/concerns, and provided emotional support. Patient will work on implementing appropriate self-care habits into their daily routine such as: staying positive, writing a gratitude list, drinking water, staying active around the house, taking their medications as prescribed, combating negative thoughts or emotions  and staying connected with their family and friends. Positive reinforcement provided for this decision to work on this.  Motivational Interviewing  employed Depression screen reviewed  PHQ2/ PHQ9 completed or reviewed  Mindfulness or Relaxation training provided Active listening / Reflection utilized  Advance Care and HCPOA education provided Emotional Support Provided Problem Solving /Task Center strategies reviewed Provided psychoeducation for mental health needs  Provided brief CBT  Reviewed mental health medications and discussed importance of compliance:  Quality of sleep assessed & Sleep Hygiene techniques promoted  Participation in counseling encouraged  Verbalization of feelings encouraged  Suicidal Ideation/Homicidal Ideation assessed: Patient denies SI/HI  Review resources, discussed options and provided patient information about  Mental Health Resources Inter-disciplinary care team collaboration (see longitudinal plan of care) Update- Patient reports that she did not successfully receive resources from either [facility] LCSW and BSW. Patient reports having issues with that email address so provided a new one, [email or web address]. [facility] LCSW re-sent resources to new email addressed and will update [facility] BSW upon her return back to the office to suggest doing the same. Patient reports that her stress has increased due to financial stressors and loss of her food stamps. Brief self-care and emotional support provided to patient. Patient was advised to please contact [facility] LCSW directly if she is unable to retrieve re-sent resources. Patient called back later in the day asking for transportation resources and her insurance does provide transportation through [employer]. She physically wrote down their number which is [phone]. 04/04/23 update-Patient, grandmother and [facility] LCSW completed joint phone call together. Patient reports having psychiatry and counselor already in place but does not know the agency name and neither does grandmother. She reports that she will retrieve this information successfully. Grandmother reports that she  believes patient only has a Veterinary surgeon and they would like to review the resources again to find a agency that has both counseling and psychiatry. Email sent to grandmother with requested [facility] resources. Family will meet together this weekend and then chose an agency that would like [facility] LCSW to place a referral during next outreach. Per recent PCP visit, she reports continued sobriety with two past slips but none recently. [facility] LCSW and PCP discussed with patient the importance of maintaining sobriety and the risk of late relapse. She was encouraged to continue using a sobriety tracking app and participating in a 12-step program. [facility] LCSW informed grandmother that PCP is recommending ongoing 12 step program involvement and for patient to connect with a sponsor which could be beneficial for her continued recovery. Grandmother expressed understanding. [facility] LCSW will follow up within 30 days. Update- [facility] LCSW, grandmother and patient made joint call together to [facility] and successfully signed her up for services and for her first psychiatry appointment on 05/16/23 at 5 pm. 05/15/23- Patient reports that she did successfully complete her intake paperwork online for [facility] and will be able to attend her appointment tomorrow at 5 pm immediately after work. Patient was reminded to ensure that they get her scheduled for therapy after appointment as patients are required to meet with psychiatry first before getting set up with counseling. Patient wrote this reminder physically down so that she can mention this tomorrow.   Patient Goals/Self-Care Activities: Take medications as prescribed   Attend all scheduled provider appointments Call pharmacy for medication refills 3-7 days in advance of running out of medications Perform all self care activities independently  Perform IADL's (shopping, preparing meals, housekeeping, managing finances) independently Call provider office for new concerns or  questions Work with the  Child psychotherapist to address care coordination needs and will continue to work with the clinical team to address health care and disease management related needs call 1-800-273-TALK (toll free, 24 hour hotline) go to [facility] [address] [place] [phone]) call 911 if experiencing a Mental Health or Behavioral Health Crisis  Utilize healthy coping skills and supportive resources discussed Contact PCP with any questions or concerns Keep 90 percent of counseling appointments Call your insurance provider for more information about your Enhanced Benefits  Check out counseling resources provided  Begin personal counseling with LCSW, to reduce and manage symptoms of Depression and Stress, until well-established with mental health provider Accept all calls from representative with [facility] in an effort to establish ongoing mental health counseling and supportive services. Incorporate into daily practice - relaxation techniques, deep breathing exercises, and mindfulness meditation strategies. Talk about feelings with friends, family members, spiritual advisor, etc. Contact LCSW directly [phone]), if you have questions, need assistance, or if additional social work needs are identified between now and our next scheduled telephone outreach call. Call 988 for mental health hotline/crisis line if needed (24/7 available) Try techniques to reduce symptoms of anxiety/negative thinking (deep breathing, distraction, positive self talk, etc)  - develop a personal safety plan - develop a plan to deal with triggers like holidays, anniversaries - exercise at least 2 to 3 times per week - have a plan for how to handle bad days - journal feelings and what helps to feel better or worse - spend time or talk with others at least 2 to 3 times per week - watch for early signs of feeling worse - begin personal counseling - call and visit an old friend - check out  volunteer opportunities - join a support group - laugh; watch a funny movie or comedian - learn and use visualization or guided imagery - perform a random act of kindness - practice relaxation or meditation daily - start or continue a personal journal - practice positive thinking and self-talk -continue with compliance of taking medication  -identify current effective and ineffective coping strategies.  -implement positive self-talk in care to increase self-esteem, confidence and feelings of control.  -consider alternative and complementary therapy approaches such as meditation, mindfulness or yoga.  Call your insurance provider to gain education on benefits if desired Call your primary care doctor if symptoms get worse  -journaling, prayer, worship services, meditation or pastoral counseling.  -increase participation in pleasurable group activities such as hobbies, singing, sports or volunteering).  -consider the use of meditative movement therapy such as tai chi, yoga or qigong.  -start a regular daily exercise program based on tolerance, ability and patient choice to support positive thinking and activity    Follow Up Plan:  The patient has been provided with contact information for the care management team and has been advised to call with any mental health or health related questions or concerns.  The care management team will reach out to the patient again over the next 30 business  days.   If you are experiencing a Mental Health or Behavioral Health Crisis or need someone to talk to, please call the Suicide and Crisis Lifeline: 988    Patient Goals: Follow up goal        Follow up:  Patient agrees to Care Plan and Follow-up.  Plan: The Managed Medicaid care management team will reach out to the patient again over the next 30 days.  Dickie La, BSW, MSW, [employer] Managed Medicaid LCSW [facility]  [facility] [email or web address] Phone:  [phone]

## 2023-05-16 DIAGNOSIS — F3175 Bipolar disorder, in partial remission, most recent episode depressed: Secondary | ICD-10-CM | POA: Diagnosis not present

## 2023-05-16 DIAGNOSIS — F411 Generalized anxiety disorder: Secondary | ICD-10-CM | POA: Diagnosis not present

## 2023-05-20 ENCOUNTER — Other Ambulatory Visit: Payer: Self-pay | Admitting: Internal Medicine

## 2023-05-20 DIAGNOSIS — F325 Major depressive disorder, single episode, in full remission: Secondary | ICD-10-CM

## 2023-05-20 DIAGNOSIS — F5105 Insomnia due to other mental disorder: Secondary | ICD-10-CM

## 2023-05-27 ENCOUNTER — Other Ambulatory Visit: Payer: Self-pay | Admitting: Licensed Clinical Social Worker

## 2023-05-27 NOTE — Patient Instructions (Signed)
Natasha Pope ,   The [facility] Managed Care Team is available to provide assistance to you with your healthcare needs at no cost and as a benefit of your [facility] plan. I'm sorry I was unable to reach you today for our scheduled appointment. Our care guide will call you to reschedule our telephone appointment. Please call me at the number below. I am available to be of assistance to you regarding your healthcare needs. .   Thank you,   Dickie La, BSW, MSW, LCSW Licensed Clinical Social Worker [employer]   [facility] [email or web address] Direct Dial: [phone]

## 2023-05-27 NOTE — Patient Outreach (Signed)
  Medicaid Managed Care   Unsuccessful Attempt Note   05/27/2023 Name: Natasha Pope MRN: [id number] DOB: 1996-04-08  Referred by: [name], MD Reason for referral : No chief complaint on file.   A second unsuccessful telephone outreach was attempted today. The patient was referred to the case management team for assistance with care management and care coordination.    Follow Up Plan: A HIPAA compliant phone message was left for the patient providing contact information and requesting a return call.   Dickie La, BSW, MSW, LCSW Licensed Clinical Social Worker [employer] [facility]   [facility] [email or web address] Direct Dial: [phone]

## 2023-05-31 ENCOUNTER — Telehealth: Payer: Self-pay

## 2023-05-31 NOTE — Telephone Encounter (Signed)
..   Medicaid Managed Care   Unsuccessful Outreach Note  05/31/2023 Name: Natasha Pope MRN: [id number] DOB: Apr 18, 1996  Referred by: [name], MD Reason for referral : Appointment   A second unsuccessful telephone outreach was attempted today. The patient was referred to the case management team for assistance with care management and care coordination.   Follow Up Plan: A HIPAA compliant phone message was left for the patient providing contact information and requesting a return call.  The care management team will reach out to the patient again over the next 7 days.   Weston Settle Care Guide  [facility] Managed  Care Guide [facility]  [phone]

## 2023-06-03 ENCOUNTER — Telehealth: Payer: Self-pay | Admitting: Internal Medicine

## 2023-06-03 NOTE — Telephone Encounter (Signed)
Please see message below and advise.

## 2023-06-03 NOTE — Telephone Encounter (Signed)
Prescription Request  06/03/2023  LOV: 04/10/2023  Patient states she was cleaning when she accidentally threw away her medicine bag. States by the time she noticed, she had already placed the trash in the dumpster.   What is the name of the medication or equipment? omeprazole (PRILOSEC) 20 MG capsule   acamprosate (CAMPRAL) 333 MG tablet   ondansetron (ZOFRAN) 4 MG tablet   topiramate (TOPAMAX) 25 MG tablet   traZODone (DESYREL) 100 MG tablet   norethindrone-ethinyl estradiol-FE (HAILEY FE 1/20) 1-20 MG-MCG tablet   QUEtiapine (SEROQUEL) 100 MG tablet   Dextromethorphan-buPROPion ER (AUVELITY) 45-105 MG TBCR   Have you contacted your pharmacy to request a refill? Yes   Which pharmacy would you like this sent to?  [id number] - [place], [place] - [address] [place] [place] [address] Phone: [phone] Fax: [phone]   Patient notified that their request is being sent to the clinical staff for review and that they should receive a response within 2 business days.   Please advise at Mobile [phone] (mobile)

## 2023-06-06 ENCOUNTER — Telehealth: Payer: Self-pay

## 2023-06-06 ENCOUNTER — Other Ambulatory Visit: Payer: Self-pay

## 2023-06-06 DIAGNOSIS — F1094 Alcohol use, unspecified with alcohol-induced mood disorder: Secondary | ICD-10-CM

## 2023-06-06 DIAGNOSIS — E669 Obesity, unspecified: Secondary | ICD-10-CM

## 2023-06-06 DIAGNOSIS — K219 Gastro-esophageal reflux disease without esophagitis: Secondary | ICD-10-CM

## 2023-06-06 DIAGNOSIS — R112 Nausea with vomiting, unspecified: Secondary | ICD-10-CM

## 2023-06-06 DIAGNOSIS — F322 Major depressive disorder, single episode, severe without psychotic features: Secondary | ICD-10-CM

## 2023-06-06 DIAGNOSIS — Z3009 Encounter for other general counseling and advice on contraception: Secondary | ICD-10-CM

## 2023-06-06 DIAGNOSIS — F5105 Insomnia due to other mental disorder: Secondary | ICD-10-CM

## 2023-06-06 MED ORDER — OMEPRAZOLE 20 MG PO CPDR: 40.0000 mg | DELAYED_RELEASE_CAPSULE | Freq: Every day | ORAL | 0 refills | Status: DC

## 2023-06-06 MED ORDER — TOPIRAMATE 25 MG PO TABS: 50.0000 mg | ORAL_TABLET | Freq: Three times a day (TID) | ORAL | 1 refills | Status: DC

## 2023-06-06 MED ORDER — QUETIAPINE FUMARATE 100 MG PO TABS: 100.0000 mg | ORAL_TABLET | Freq: Two times a day (BID) | ORAL | 0 refills | Status: DC

## 2023-06-06 MED ORDER — NORETHIN ACE-ETH ESTRAD-FE 1-20 MG-MCG PO TABS: 1.0000 | ORAL_TABLET | Freq: Every day | ORAL | 4 refills | Status: DC

## 2023-06-06 MED ORDER — AUVELITY 45-105 MG PO TBCR: 1.0000 | EXTENDED_RELEASE_TABLET | Freq: Two times a day (BID) | ORAL | 3 refills | Status: DC

## 2023-06-06 MED ORDER — ONDANSETRON 4 MG PO TBDP: 4.0000 mg | ORAL_TABLET | Freq: Three times a day (TID) | ORAL | 2 refills | Status: DC | PRN

## 2023-06-06 MED ORDER — ACAMPROSATE CALCIUM 333 MG PO TBEC: 666.0000 mg | DELAYED_RELEASE_TABLET | Freq: Three times a day (TID) | ORAL | 3 refills | Status: DC

## 2023-06-06 NOTE — Telephone Encounter (Signed)
..   Medicaid Managed Care   Unsuccessful Outreach Note  06/06/2023 Name: Natasha Pope MRN: [id number] DOB: [birth year]  Referred by: [name], MD Reason for referral : Appointment   Third unsuccessful telephone outreach was attempted today. The patient was referred to the case management team for assistance with care management and care coordination. The patient's primary care provider has been notified of our unsuccessful attempts to make or maintain contact with the patient. The care management team is pleased to engage with this patient at any time in the future should he/she be interested in assistance from the care management team.   Follow Up Plan: We have been unable to make contact with the patient for follow up. The care management team is available to follow up with the patient after provider conversation with the patient regarding recommendation for care management engagement and subsequent re-referral to the care management team.   Weston Settle Care Guide  [facility] Managed  Care Guide [facility]  [phone]

## 2023-06-06 NOTE — Telephone Encounter (Signed)
Sent all medications to requested pharmacy except the trazadone per Dr. Jon Billings.

## 2023-06-17 ENCOUNTER — Ambulatory Visit: Payer: Medicaid Other | Admitting: Family

## 2023-06-17 ENCOUNTER — Encounter: Payer: Self-pay | Admitting: Family

## 2023-06-17 VITALS — BP 129/89 | HR 126 | Temp 98.0°F | Ht 65.0 in | Wt 200.0 lb

## 2023-06-17 DIAGNOSIS — B370 Candidal stomatitis: Secondary | ICD-10-CM | POA: Diagnosis not present

## 2023-06-17 DIAGNOSIS — J069 Acute upper respiratory infection, unspecified: Secondary | ICD-10-CM | POA: Diagnosis not present

## 2023-06-17 LAB — POCT INFLUENZA A/B
Influenza A, POC: NEGATIVE
Influenza B, POC: NEGATIVE

## 2023-06-17 LAB — POCT RAPID STREP A (OFFICE): Rapid Strep A Screen: NEGATIVE

## 2023-06-17 LAB — POC COVID19 BINAXNOW: SARS Coronavirus 2 Ag: NEGATIVE

## 2023-06-17 MED ORDER — NYSTATIN 100000 UNIT/ML MT SUSP: 5.0000 mL | Freq: Three times a day (TID) | OROMUCOSAL | 0 refills | Status: DC | PRN

## 2023-06-17 NOTE — Progress Notes (Signed)
Patient ID: Natasha Pope, female    DOB: January 04, 1996, 27 y.o.   MRN: [id number]  Chief Complaint  Patient presents with   Sinus Problem    Pt c/o sore throat, cough, Nasal/chest congestion and left ear pain. Present for 5 days. Has tried robitussin, benadryl and sudafed.    Discussed the use of AI scribe software for clinical note transcription with the patient, who gave verbal consent to proceed.  History of Present Illness   The patient, with a history of asthma, presents with a persistent cough and sore throat that started almost a week ago. Initially, she attributed the symptoms to allergies as she had only lost her voice for two days. However, after the third day, her throat started to hurt and she began coughing. She has been taking Sudafed, Robitussin, and Benadryl to manage her symptoms. She also notes a white coating on her tongue, which she has experienced in the past when she gets sick. She denies any pain in the tongue, but reports pain in the back of her throat, especially when swallowing. She also reports soreness in her left ear, but denies any tenderness. She has been feeling sweaty, but denies any fever or chills. She works in [employer] and reports potential exposure to a Immunologist.     Assessment & Plan:     Upper Respiratory Infection - Symptoms started last week with loss of voice, followed by sore throat and cough. No fever reported, but patient reports feeling sweaty. Examination revealed white coating on tongue, possibly thrush. Left ear w/mild injection. -Continue current over-the-counter medications (Sudafed, Robitussin, Benadryl). -Prescribe mouthwash for potential thrush; instruct patient to gargle and spit out, swallowing a couple of times if it helps soothe the throat. -Recommend ibuprofen 600mg  tid or generic Aleve 1-2 tabs bid prn for inflammation and discomfort. -Advise patient to monitor ear discomfort and contact office if it worsens. -Advise  patient to stay hydrated, mainly with water, 2L daily.  Work Restrictions - Patient works in [employer] and has been feeling unwell. -Provide note for work excusing patient until Wednesday.    Subjective:    Outpatient Medications Prior to Visit  Medication Sig Dispense Refill   acamprosate (CAMPRAL) 333 MG tablet Take 2 tablets (666 mg total) by mouth 3 (three) times daily with meals. 270 tablet 3   busPIRone (BUSPAR) 15 MG tablet Take 1 tablet (15 mg total) by mouth 3 (three) times daily. Must be taken consistently. 270 tablet 3   Dextromethorphan-buPROPion ER (AUVELITY) 45-105 MG TBCR Take 1 tablet by mouth 2 (two) times daily. To start, take just one tablet once daily in the morning. After 3 days, increase to the maximum recommended dosage of one tablet twice daily, given at least 8 hours apart. Do not exceed two doses within the same day. 180 tablet 3   escitalopram (LEXAPRO) 20 MG tablet Take 1 tablet (20 mg total) by mouth daily. 90 tablet 3   methocarbamol (ROBAXIN) 500 MG tablet Take 1 tablet (500 mg total) by mouth 4 (four) times daily. 120 tablet 3   Multiple Vitamin (MULTIVITAMIN WITH MINERALS) TABS tablet Take 1 tablet by mouth daily. 30 tablet 0   norethindrone-ethinyl estradiol-FE (HAILEY FE 1/20) 1-20 MG-MCG tablet Take 1 tablet by mouth daily. 84 tablet 4   omeprazole (PRILOSEC) 20 MG capsule Take 2 capsules (40 mg total) by mouth daily. 30 capsule 0   ondansetron (ZOFRAN) 4 MG tablet Take 1 tablet (4 mg total) by mouth  every 8 (eight) hours as needed for nausea or vomiting. 30 tablet 2   ondansetron (ZOFRAN-ODT) 4 MG disintegrating tablet Take 1 tablet (4 mg total) by mouth every 8 (eight) hours as needed for nausea or vomiting. 60 tablet 2   propranolol (INDERAL) 20 MG tablet Take 1 tablet (20 mg total) by mouth 3 (three) times daily. 270 tablet 3   QUEtiapine (SEROQUEL) 100 MG tablet Take 1 tablet (100 mg total) by mouth 2 (two) times daily. Take as 50 mg in morning, 150 mg  in evening.  Stop duloxetine and trazodone. Ok to take with elevate. 90 tablet 0   rizatriptan (MAXALT) 5 MG tablet Take 5 mg by mouth as needed for migraine. May repeat in 2 hours if needed     thiamine (VITAMIN B-1) 100 MG tablet Take 1 tablet (100 mg total) by mouth daily. 30 tablet 0   topiramate (TOPAMAX) 25 MG tablet Take 2 tablets (50 mg total) by mouth in the morning, at noon, and at bedtime. 90 tablet 1   traZODone (DESYREL) 100 MG tablet Take 100 mg by mouth at bedtime.     No facility-administered medications prior to visit.   Past Medical History:  Diagnosis Date   Anxiety    Asthma    Depression    Gastroenteritis 10/22/2022   Nausea, vomiting, and diarrhea x 3 days Secondary dehydration-> dizziness, low blood pressure, dry tongue Bad pain bilateral headache(s) started yesterday, likely from dehydration.  No abdomen pain. 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 Suspect due to vitamin deficiency Recommended she take daily mvi and continue to abstain from EtOH   Nausea vomiting and diarrhea 06/27/2022   Just for past week or two.   Nicotine dependence due to vaping tobacco product 01/31/2018   Has been smoking since she was 27 years old.    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   Technique we just overweight by BMI but she puts a lot of the weight on her abdomen so  I am documenting this as obesity is at least truncal obesity therefore I am going to try to send in for [facility] and see if insurance will cover as not only would help weight loss but actually will reduce her desire to drink   PTSD (post-traumatic stress disorder) 03/19/2022   Pyelonephritis    Seizure-like activity (HCC)    Seizures (HCC)    Sepsis (HCC)    Snoring 04/17/2022   Thrombocytopenia (HCC) 05/19/2018   Lab Results  Component  Value  Date/Time     PLT  285  08/03/2022 02:36 PM     PLT  133 (L)  06/28/2022 06:11 AM     PLT  186  06/27/2022 05:13 AM     PLT  203  06/26/2022 06:52 PM     PLT  323  03/08/2022 12:07 PM  PLT  247  10/03/2020 04:03 PM         Unwanted pregnancy 11/26/2022   Withdrawal syndrome (HCC) 09/27/2022   Severe depression past 3 days out of proportion to triggers, suspicious for withdrawal from [place] that she recently ran out of.  Also recent change of seroquel considered but we went 50->100 and its helping her sleep and usually helps severe depression     Past Surgical History:  Procedure Laterality Date   dental procedure     Allergies  Allergen Reactions   Lamotrigine Hives      Objective:    Physical Exam Vitals and nursing note reviewed.  Constitutional:      Appearance: Normal appearance. She is ill-appearing.     Interventions: Face mask in place.  HENT:     Right Ear: Tympanic membrane and ear canal normal.     Left Ear: Ear canal normal. Tympanic membrane is injected (mild).     Nose: Congestion and rhinorrhea present.     Right Sinus: No frontal sinus tenderness (pressure).     Left Sinus: No frontal sinus tenderness (pressure).     Mouth/Throat:     Mouth: Mucous membranes are moist.     Tongue: Lesions (white coating all over tongiue) present.     Pharynx: Posterior oropharyngeal erythema present. No pharyngeal swelling, oropharyngeal exudate or uvula swelling.     Tonsils: No tonsillar exudate or tonsillar abscesses.   Cardiovascular:     Rate and Rhythm: Normal rate and regular rhythm.  Pulmonary:     Effort: Pulmonary effort is normal.     Breath sounds: Normal breath sounds.  Musculoskeletal:        General: Normal range of motion.  Lymphadenopathy:     Head:     Right side of head: No preauricular or posterior auricular adenopathy.     Left side of head: No preauricular or posterior auricular adenopathy.     Cervical: No cervical adenopathy.  Skin:    General: Skin is warm and dry.  Neurological:     Mental Status: She is alert.  Psychiatric:        Mood and Affect: Mood normal.        Behavior: Behavior normal.    BP 129/89 (BP Location: Left Arm, Patient Position: Sitting, Cuff Size: Large)   Pulse (!) 126   Temp 98 F (36.7 C) (Temporal)   Ht 5\' 5"  (1.651 m)   Wt 200 lb (90.7 kg)   LMP 06/12/2023 (Approximate)   SpO2 99%   BMI 33.28 kg/m  Wt Readings from Last 3 Encounters:  06/17/23 200 lb (90.7 kg)  04/10/23 199 lb 9.6 oz (90.5 kg)  03/20/23 200 lb (90.7 kg)       Dulce Sellar, NP

## 2023-07-22 ENCOUNTER — Ambulatory Visit: Payer: Medicaid Other | Admitting: Internal Medicine

## 2023-07-22 ENCOUNTER — Encounter: Payer: Self-pay | Admitting: Internal Medicine

## 2023-07-22 VITALS — BP 131/94 | HR 80 | Temp 96.6°F | Ht 65.0 in | Wt 201.4 lb

## 2023-07-22 DIAGNOSIS — K219 Gastro-esophageal reflux disease without esophagitis: Secondary | ICD-10-CM

## 2023-07-22 DIAGNOSIS — F1021 Alcohol dependence, in remission: Secondary | ICD-10-CM | POA: Diagnosis not present

## 2023-07-22 DIAGNOSIS — E669 Obesity, unspecified: Secondary | ICD-10-CM

## 2023-07-22 DIAGNOSIS — F322 Major depressive disorder, single episode, severe without psychotic features: Secondary | ICD-10-CM | POA: Diagnosis not present

## 2023-07-22 MED ORDER — SEMAGLUTIDE-WEIGHT MANAGEMENT 2.4 MG/0.75ML ~~LOC~~ SOAJ: 2.4000 mg | SUBCUTANEOUS | 0 refills | Status: AC

## 2023-07-22 MED ORDER — SEMAGLUTIDE-WEIGHT MANAGEMENT 0.5 MG/0.5ML ~~LOC~~ SOAJ: 0.5000 mg | SUBCUTANEOUS | 0 refills | Status: AC

## 2023-07-22 MED ORDER — SEMAGLUTIDE-WEIGHT MANAGEMENT 0.25 MG/0.5ML ~~LOC~~ SOAJ: 0.2500 mg | SUBCUTANEOUS | 0 refills | Status: AC

## 2023-07-22 MED ORDER — OMEPRAZOLE 20 MG PO CPDR: 20.0000 mg | DELAYED_RELEASE_CAPSULE | Freq: Every day | ORAL | 11 refills | Status: AC

## 2023-07-22 MED ORDER — SEMAGLUTIDE-WEIGHT MANAGEMENT 1 MG/0.5ML ~~LOC~~ SOAJ: 1.0000 mg | SUBCUTANEOUS | 0 refills | Status: AC

## 2023-07-22 MED ORDER — TOPIRAMATE 25 MG PO TABS: 50.0000 mg | ORAL_TABLET | Freq: Two times a day (BID) | ORAL | 3 refills | Status: DC

## 2023-07-22 MED ORDER — NALTREXONE HCL 50 MG PO TABS: 50.0000 mg | ORAL_TABLET | Freq: Every day | ORAL | 3 refills | Status: DC

## 2023-07-22 MED ORDER — SEMAGLUTIDE-WEIGHT MANAGEMENT 1.7 MG/0.75ML ~~LOC~~ SOAJ: 1.7000 mg | SUBCUTANEOUS | 0 refills | Status: AC

## 2023-07-22 NOTE — Assessment & Plan Note (Signed)
Alcohol Use Disorder She maintains sobriety for almost a year with chronic alcohol use disorder, high relapse risk due to occupational exposure, and use acamprosate to reduce cravings. A recent lapse occurred due to accidental medication discard. We discussed the risks of alcohol use, acamprosate's potential sedation, and the benefits of Ozempic for cravings and weight loss, including naltrexone's role in reducing alcohol enjoyment. We will increase acamprosate to three times daily around work shifts, discuss acamprosate's sedation effects with their manager, discontinue topiramate due to contraceptive interaction, start naltrexone, consider Ozempic pending insurance approval, and print a MyChart handout for medication instructions.

## 2023-07-22 NOTE — Patient Instructions (Addendum)
It was a pleasure seeing you today! Your health and satisfaction are our top priorities.  Glenetta Hew, MD  VISIT SUMMARY:  During today's visit, we discussed your ongoing sobriety and the challenges posed by your new job environment. We reviewed your current medications and addressed a recent lapse in adherence. We also talked about managing your depression, anxiety, and heartburn, and discussed general health maintenance, including birth control options.  YOUR PLAN:  -ALCOHOL USE DISORDER: Alcohol use disorder is a condition characterized by an inability to control alcohol consumption despite negative consequences. To help manage your cravings, we will increase your acamprosate dosage to three times daily around your work shifts. We also discussed the potential sedation effects of acamprosate with your manager. We will discontinue topiramate due to its interaction with your contraceptive, start naltrexone to reduce the enjoyment of alcohol, and consider Ozempic for cravings and weight loss pending insurance approval. A MyChart handout with medication instructions will be provided.  -DEPRESSION AND ANXIETY: Depression and anxiety are mental health conditions that can cause persistent feelings of sadness and worry. You are currently taking Auvelity and Lexapro for these conditions. We emphasized the importance of consistent medication use to manage your symptoms effectively. Please ensure you pick up your medication refills from the pharmacy.  -GASTROESOPHAGEAL REFLUX DISEASE (GERD): GERD is a digestive disorder that affects the lower esophageal sphincter, leading to heartburn and acid reflux. Your symptoms are well-managed with omeprazole, which you should continue taking once daily. We will also refill your prescription.  -GENERAL HEALTH MAINTENANCE: We discussed your current use of birth control and propranolol. Due to the interaction between topiramate and your contraceptive, we will discontinue  topiramate. Although you declined alternative methods like Nexplanon, we will continue your current birth control and propranolol.  INSTRUCTIONS:  Please pick up all your medication refills from [facility] pharmacy. If you need financial assistance, call [phone]. Schedule a follow-up appointment to reassess the efficacy and side effects of your medications.     NEXT STEPS: [x]  Early Intervention: Schedule sooner appointment, call our on-call services, or go to emergency room if there is any significant Increase in pain or discomfort New or worsening symptoms Sudden or severe changes in your health [x]  Flexible Follow-Up: We recommend a No follow-ups on file. for optimal routine care. This allows for progress monitoring and treatment adjustments. [x]  Preventive Care: Schedule your annual preventive care visit! It's typically covered by insurance and helps identify potential health issues early. [x]  Lab & X-ray Appointments: Incomplete tests scheduled today, or call to schedule. X-rays: [facility] (M-F, 8:30am-noon or 1pm-5pm). [x]  Medical Information Release: Sign a release form at front desk to obtain relevant medical information we don't have.  MAKING THE MOST OF OUR FOCUSED 20 MINUTE APPOINTMENTS: [x]   Clearly state your top concerns at the beginning of the visit to focus our discussion [x]   If you anticipate you will need more time, please inform the front desk during scheduling - we can book multiple appointments in the same week. [x]   If you have transportation problems- use our convenient video appointments or ask about transportation support. [x]   We can get down to business faster if you use MyChart to update information before the visit and submit non-urgent questions before your visit. Thank you for taking the time to provide details through MyChart.  Let our nurse know and she can import this information into your encounter documents.  Arrival and Wait Times: [x]    Arriving on time ensures  that everyone receives prompt attention. [x]   Early morning (8a) and afternoon (1p) appointments tend to have shortest wait times. [x]   Unfortunately, we cannot delay appointments for late arrivals or hold slots during phone calls.  Getting Answers and Following Up [x]   Simple Questions & Concerns: For quick questions or basic follow-up after your visit, reach [place] at [phone] or MyChart messaging. [x]   Complex Concerns: If your concern is more complex, scheduling an appointment might be best. Discuss this with the staff to find the most suitable option. [x]   Lab & Imaging Results: We'll contact you directly if results are abnormal or you don't use MyChart. Most normal results will be on MyChart within 2-3 business days, with a review message from Dr. Jon Billings. Haven't heard back in 2 weeks? Need results sooner? Contact us at [phone]. [x]   Referrals: Our referral coordinator will manage specialist referrals. The specialist's office should contact you within 2 weeks to schedule an appointment. Call us if you haven't heard from them after 2 weeks.  Staying Connected [x]   MyChart: Activate your MyChart for the fastest way to access results and message [place]. See the last page of this paperwork for instructions on how to activate.  Bring to Your Next Appointment [x]   Medications: Please bring all your medication bottles to your next appointment to ensure we have an accurate record of your prescriptions. [x]   Health Diaries: If you're monitoring any health conditions at home, keeping a diary of your readings can be very helpful for discussions at your next appointment.  Billing [x]   X-ray & Lab Orders: These are billed by separate companies. Contact the invoicing company directly for questions or concerns. [x]   Visit Charges: Discuss any billing inquiries with our administrative services team.  Your Satisfaction Matters [x]   Share Your Experience: We strive for your  satisfaction! If you have any complaints, or preferably compliments, please let Dr. Jon Billings know directly or contact our Practice Administrators, Edwena Felty or [employer], by asking at the front desk.   Reviewing Your Records [x]   Review this early draft of your clinical encounter notes below and the final encounter summary tomorrow on MyChart after its been completed.  All orders placed so far are visible here: Alcohol use disorder, severe, in early remission, dependence (HCC) -     Semaglutide-Weight Management; Inject 0.25 mg into the skin once a week for 28 days.  Dispense: 2 mL; Refill: 0 -     Semaglutide-Weight Management; Inject 0.5 mg into the skin once a week for 28 days.  Dispense: 2 mL; Refill: 0 -     Semaglutide-Weight Management; Inject 1 mg into the skin once a week for 28 days.  Dispense: 2 mL; Refill: 0 -     Semaglutide-Weight Management; Inject 1.7 mg into the skin once a week for 28 days.  Dispense: 3 mL; Refill: 0 -     Semaglutide-Weight Management; Inject 2.4 mg into the skin once a week for 28 days.  Dispense: 3 mL; Refill: 0 -     Naltrexone HCl; Take 1 tablet (50 mg total) by mouth daily.  Dispense: 90 tablet; Refill: 3  Gastroesophageal reflux disease without esophagitis -     Omeprazole; Take 1 capsule (20 mg total) by mouth daily.  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  Dispense: 2 mL; Refill: 0 -  Semaglutide-Weight Management; Inject 0.5 mg into the skin once a week for 28 days.  Dispense: 2 mL; Refill: 0 -     Semaglutide-Weight Management; Inject 1 mg into the skin once a week for 28 days.  Dispense: 2 mL; Refill: 0 -     Semaglutide-Weight Management; Inject 1.7 mg into the skin once a week for 28 days.  Dispense: 3 mL; Refill: 0 -     Semaglutide-Weight Management; Inject 2.4 mg into the skin once a week for 28 days.  Dispense: 3 mL;  Refill: 0  Depression, major, single episode, severe (HCC)

## 2023-07-22 NOTE — Progress Notes (Signed)
==============================  [facility]: [phone]   -- Medical Office Visit --  Patient: Natasha Pope      Age: 27 y.o.       Sex:  female  Date:   07/22/2023 Today's Healthcare Provider: [name], MD  ==============================   CHIEF COMPLAINT: 3 month follow-up (Behind for two week follow-up.)  Depression and alcohol use disorder   SUBJECTIVE: 27 y.o. female who 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; Financial difficulties; Gastroenteritis; Contraceptive management; GERD (gastroesophageal reflux disease); Migraine; and Nausea on their problem list.  History of Present Illness The patient, with a history of alcohol use disorder, has been maintaining sobriety for approximately 10 months. She has recently started a new job in [employer] where she is frequently around alcohol, which she identifies as a potential trigger for relapse. Despite this, she reports being committed to her sobriety and has disclosed her situation to her Production designer, theatre/television/film.  She has been consistently taking her prescribed medications, including Campral (acamprosate) for alcohol craving reduction, Auvelity and Lexapro for depression and anxiety management, and omeprazole for heartburn. However, she reported a brief period of non-adherence due to accidentally discarding her medications, during which she noticed a significant  increase in her symptoms.  The patient also reports taking birth control, propranolol (Inderal), and Seroquel. She was previously on Topamax for weight loss, but this was discontinued due to potential interactions with her contraceptive.  The patient has been managing her alcohol cravings with her current medication regimen, but there is concern about the increased exposure to alcohol in her new work environment. She has expressed willingness to adjust her medication regimen to better manage potential triggers at work.  Note that patient  has a past medical history of Anxiety, Asthma, Depression, Gastroenteritis (10/22/2022), History of alcohol withdrawal delirium (07/04/2022), History of seizure due to alcohol withdrawal (02/28/2021), Memory loss (04/03/2022), Nausea vomiting and diarrhea (06/27/2022), Nicotine dependence due to vaping tobacco product (01/31/2018), 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 (04/17/2022), PTSD (post-traumatic stress disorder) (03/19/2022), Pyelonephritis, Seizure-like activity (HCC), Seizures (HCC), Sepsis (HCC), Snoring (04/17/2022), Thrombocytopenia (HCC) (05/19/2018), Unwanted pregnancy (11/26/2022), and Withdrawal syndrome (HCC) (09/27/2022).  Problem list overviews that were updated at today's visit:No problems updated.  Verbalized medication(s): Medications - Acamprosate - Auvelity - Lexapro - Omeprazole - Zofran - Propranolol - Inderal - Seroquel - Topamax  Med reconciliation: Current Outpatient Medications on File Prior to Visit  Medication Sig   acamprosate (CAMPRAL) 333 MG tablet Take 2 tablets (666 mg total) by mouth 3 (three) times daily with meals.   busPIRone (BUSPAR) 15 MG tablet Take 1 tablet (15 mg total) by mouth 3 (three) times daily. Must be taken consistently.   Dextromethorphan-buPROPion ER (AUVELITY) 45-105 MG TBCR Take 1 tablet by mouth 2 (two) times daily. To start, take just one tablet once daily in the morning. After 3 days, increase to the  maximum recommended dosage of one tablet twice daily, given at least 8 hours apart. Do not exceed two doses within the same day.   escitalopram (LEXAPRO) 20 MG tablet Take 1 tablet (  20 mg total) by mouth daily.   magic mouthwash (nystatin, lidocaine, diphenhydrAMINE, alum & mag hydroxide) suspension Swish and spit 5-10 mLs 3 (three) times daily as needed for mouth pain. OK to swallow 5ml if having throat pain also.   methocarbamol (ROBAXIN) 500 MG tablet Take 1 tablet (500 mg total) by mouth 4 (four) times daily.   Multiple Vitamin (MULTIVITAMIN WITH MINERALS) TABS tablet Take 1 tablet by mouth daily.   norethindrone-ethinyl estradiol-FE (HAILEY FE 1/20) 1-20 MG-MCG tablet Take 1 tablet by mouth daily.   ondansetron (ZOFRAN) 4 MG tablet Take 1 tablet (4 mg total) by mouth every 8 (eight) hours as needed for nausea or vomiting.   ondansetron (ZOFRAN-ODT) 4 MG disintegrating tablet Take 1 tablet (4 mg total) by mouth every 8 (eight) hours as needed for nausea or vomiting.   propranolol (INDERAL) 20 MG tablet Take 1 tablet (20 mg total) by mouth 3 (three) times daily.   QUEtiapine (SEROQUEL) 100 MG tablet Take 1 tablet (100 mg total) by mouth 2 (two) times daily. Take as 50 mg in morning, 150 mg in evening.  Stop duloxetine and trazodone. Ok to take with elevate.   rizatriptan (MAXALT) 5 MG tablet Take 5 mg by mouth as needed for migraine. May repeat in 2 hours if needed   thiamine (VITAMIN B-1) 100 MG tablet Take 1 tablet (100 mg total) by mouth daily.   traZODone (DESYREL) 100 MG tablet Take 100 mg by mouth at bedtime.   [DISCONTINUED] Dextromethorphan HBr 15 MG TABS Take 3 tablets by mouth daily at 6 (six) AM.   No current facility-administered medications on file prior to visit.   Medications Discontinued During This Encounter  Medication Reason   topiramate (TOPAMAX) 25 MG tablet Completed Course   omeprazole (PRILOSEC) 20 MG capsule Reorder   topiramate (TOPAMAX) 25 MG tablet Completed Course       Objective   Physical Exam     07/22/2023    1:18 PM 07/22/2023    1:10 PM 06/17/2023   10:43 AM  Vitals with BMI  Height  5\' 5"  5\' 5"   Weight  201 lbs 6 oz 200 lbs  BMI  33.51 33.28  Systolic 131 130 621  Diastolic 94 94 89  Pulse  80 126   Wt Readings from Last 10 Encounters:  07/22/23 201 lb 6.4 oz (91.4 kg)  06/17/23 200 lb (90.7 kg)  04/10/23 199 lb 9.6 oz (90.5 kg)  03/20/23 200 lb (90.7 kg)  03/04/23 194 lb 3.2 oz (88.1 kg)  02/22/23 195 lb 6.4 oz (88.6 kg)  02/01/23 186 lb (84.4 kg)  01/21/23 195 lb 12.8 oz (88.8 kg)  01/18/23 188 lb 6 oz (85.4 kg)  12/11/22 194 lb 6.4 oz (88.2 kg)   Vital signs reviewed.  Nursing notes reviewed. Weight trend reviewed. Abnormalities and Problem-Specific physical exam findings:  looks healthy, sober, happy, well dressed  General Appearance:  No acute distress appreciable.   Well-groomed, healthy-appearing female.  Well proportioned with no abnormal fat distribution.  Good muscle tone. Pulmonary:  Normal work of breathing at rest, no respiratory distress apparent. SpO2: 97 %  Musculoskeletal: All extremities are intact.  Neurological:  Awake, alert, oriented, and engaged.  No obvious focal neurological deficits or cognitive impairments.  Sensorium seems unclouded.   Speech is clear and coherent with logical content. Psychiatric:  Appropriate mood, pleasant and cooperative demeanor, thoughtful and engaged during the exam    No results found for any visits on 07/22/23.  Office Visit on 06/17/2023  Component Date Value   Rapid Strep A Screen 06/17/2023 Negative    Influenza A, POC 06/17/2023 Negative    Influenza B, POC 06/17/2023 Negative    SARS Coronavirus 2 Ag 06/17/2023 Negative   Office Visit on 02/01/2023  Component Date Value   Rapid Strep A Screen 02/01/2023 Negative    SARS Coronavirus 2 Ag 02/01/2023 Negative    Glucose, Bld 02/01/2023 99    BUN 02/01/2023 8    Creat 02/01/2023 0.99 (H)    BUN/Creatinine Ratio  02/01/2023 8    Sodium 02/01/2023 134 (L)    Potassium 02/01/2023 3.0 (L)    Chloride 02/01/2023 90 (L)    CO2 02/01/2023 24    Calcium 02/01/2023 9.6    Total Protein 02/01/2023 8.2 (H)    Albumin 02/01/2023 5.0    Globulin 02/01/2023 3.2    AG Ratio 02/01/2023 1.6    Total Bilirubin 02/01/2023 0.9    Alkaline phosphatase (AP* 02/01/2023 149 (H)    AST 02/01/2023 107 (H)    ALT 02/01/2023 52 (H)    Hgb A1c MFr Bld 02/01/2023 5.1    Mean Plasma Glucose 02/01/2023 100    eAG (mmol/L) 02/01/2023 5.5   Office Visit on 08/29/2022  Component Date Value   High risk HPV 08/29/2022 Negative    Neisseria Gonorrhea 08/29/2022 Negative    Chlamydia 08/29/2022 Negative    Trichomonas 08/29/2022 Negative    Adequacy 08/29/2022 Satisfactory for evaluation; transformation zone component PRESENT.    Diagnosis 08/29/2022 - Negative for intraepithelial lesion or malignancy (NILM)    Microorganisms 08/29/2022 Shift in flora suggestive of bacterial vaginosis    Comment 08/29/2022 Normal Reference Range HPV - Negative    Comment 08/29/2022 Normal Reference Range Trichomonas - Negative    Comment 08/29/2022 Normal Reference Ranger Chlamydia - Negative    Comment 08/29/2022 Normal Reference Range Neisseria Gonorrhea - Negative    RPR Ser Ql 08/29/2022 NON-REACTIVE    HIV 1&2 Ab, 4th Generati* 08/29/2022 NON-REACTIVE    Hepatitis C Ab 08/29/2022 NON-REACTIVE   Office Visit on 08/03/2022  Component Date Value   SARS Coronavirus 2 Ag 08/03/2022 Negative    Influenza A, POC 08/03/2022 Negative    Influenza B, POC 08/03/2022 Negative    Rapid Strep A Screen 08/03/2022 Negative    Color, Urine 08/03/2022 ORANGE (A)    APPearance 08/03/2022 SL CLOUDY (A)    Specific Gravity, Urine 08/03/2022 1.015    pH 08/03/2022 8.0    Total Protein, Urine 08/03/2022 30 (A)    Urine Glucose 08/03/2022 NEGATIVE    Ketones, ur 08/03/2022 15 (A)    Bilirubin Urine 08/03/2022 SMALL (A)    Hgb urine dipstick 08/03/2022  NEGATIVE    Urobilinogen, UA 08/03/2022 2.0 (A)    Leukocytes,Ua 08/03/2022 NEGATIVE    Nitrite 08/03/2022 NEGATIVE    WBC, UA 08/03/2022 0-2/hpf    RBC / HPF 08/03/2022 0-2/hpf    Squamous Epithelial / HPF 08/03/2022 Many(>10/hpf) (A)    Bacteria, UA 08/03/2022 Few(10-50/hpf) (A)    Epithelial Casts, UA 08/03/2022 Presence of (A)    Lipase 08/03/2022 6 (L)    Glucose, Bld 08/03/2022 114 (H)    BUN 08/03/2022 4 (L)    Creat 08/03/2022 0.74    BUN/Creatinine Ratio 08/03/2022 5 (L)    Sodium 08/03/2022 135    Potassium 08/03/2022 3.4 (L)    Chloride 08/03/2022 97 (L)    CO2 08/03/2022 18 (L)    Calcium 08/03/2022 9.1  Total Protein 08/03/2022 7.9    Albumin 08/03/2022 4.5    Globulin 08/03/2022 3.4    AG Ratio 08/03/2022 1.3    Total Bilirubin 08/03/2022 0.7    Alkaline phosphatase (AP* 08/03/2022 69    AST 08/03/2022 21    ALT 08/03/2022 18    WBC 08/03/2022 12.4 (H)    RBC 08/03/2022 4.09    Hemoglobin 08/03/2022 13.7    HCT 08/03/2022 39.2    MCV 08/03/2022 95.8    MCH 08/03/2022 33.5 (H)    MCHC 08/03/2022 34.9    RDW 08/03/2022 15.6 (H)    Platelets 08/03/2022 285    MPV 08/03/2022 10.8    Amylase 08/03/2022 66   No image results found. No results found.No results found.     Assessment & Plan Alcohol use disorder, severe, in early remission, dependence (HCC) Alcohol Use Disorder She maintains sobriety for almost a year with chronic alcohol use disorder, high relapse risk due to occupational exposure, and use acamprosate to reduce cravings. A recent lapse occurred due to accidental medication discard. We discussed the risks of alcohol use, acamprosate's potential sedation, and the benefits of Ozempic for cravings and weight loss, including naltrexone's role in reducing alcohol enjoyment. We will increase acamprosate to three times daily around work shifts, discuss acamprosate's sedation effects with their manager, discontinue topiramate due to contraceptive  interaction, start naltrexone, consider Ozempic pending insurance approval, and print a MyChart handout for medication instructions. Gastroesophageal reflux disease without esophagitis Depression and Anxiety They are on Auvelity and Lexapro, with reported exacerbation of symptoms during a recent medication lapse. We emphasized the importance of consistent use. We will ensure consistent use of Auvelity and Lexapro, and confirm medication refills and pick up from the pharmacy. 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  Depression, major, single episode, severe (HCC) Depression and Anxiety They are on Auvelity and Lexapro, with reported exacerbation of symptoms during a recent medication lapse. We emphasized the importance of consistent use. We will ensure consistent use of Auvelity and Lexapro, and confirm medication refills and pick up from the pharmacy.     Orders Placed During this Encounter:  No orders of the defined types were placed in this encounter.  Meds ordered this encounter  Medications   omeprazole (PRILOSEC) 20 MG capsule    Sig: Take 1 capsule (20 mg total) by mouth daily.    Dispense:  30 capsule    Refill:  11   DISCONTD: topiramate (TOPAMAX) 25 MG tablet    Sig: Take 2 tablets (50 mg total) by mouth 2 (two) times daily.    Dispense:  360 tablet    Refill:  3   Semaglutide-Weight Management 0.25 MG/0.5ML SOAJ    Sig: Inject 0.25 mg into the skin once a week for 28 days.    Dispense:  2 mL    Refill:  0   Semaglutide-Weight Management 0.5 MG/0.5ML SOAJ    Sig: Inject 0.5 mg into the skin once a week for 28 days.    Dispense:  2 mL    Refill:  0   Semaglutide-Weight Management 1 MG/0.5ML SOAJ    Sig: Inject 1 mg into the skin once a week for 28 days.    Dispense:  2 mL    Refill:  0   Semaglutide-Weight Management 1.7 MG/0.75ML SOAJ    Sig: Inject 1.7 mg into the skin once a week for 28 days.    Dispense:  3 mL    Refill:  0  Semaglutide-Weight Management 2.4 MG/0.75ML SOAJ     Sig: Inject 2.4 mg into the skin once a week for 28 days.    Dispense:  3 mL    Refill:  0   naltrexone (DEPADE) 50 MG tablet    Sig: Take 1 tablet (50 mg total) by mouth daily.    Dispense:  90 tablet    Refill:  3   General Health Maintenance   She is on birth control and propranolol. We discussed the reduced contraceptive effectiveness with topiramate and alternative methods like Nexplanon, which was declined. We will continue birth control, continue propranolol, discontinue topiramate, and consider alternative contraceptive methods like Nexplanon, though declined.  Follow-up   She will pick up all medication refills from [facility] pharmacy, call 211 for financial assistance if needed, and schedule a follow-up appointment to reassess medication efficacy and side effects.    This document was synthesized by artificial intelligence (Abridge) using HIPAA-compliant recording of the clinical interaction;   We discussed the use of AI scribe software for clinical note transcription with the patient, who gave verbal consent to proceed.    Additional Info: This encounter employed state-of-the-art, real-time, collaborative documentation. The patient actively reviewed and assisted in updating their electronic medical record on a shared screen, ensuring transparency and facilitating joint problem-solving for the problem list, overview, and plan. This approach promotes accurate, informed care. The treatment plan was discussed and reviewed in detail, including medication safety, potential side effects, and all patient questions. We confirmed understanding and comfort with the plan. Follow-up instructions were established, including contacting the office for any concerns, returning if symptoms worsen, persist, or new symptoms develop, and precautions for potential emergency department visits.

## 2023-07-22 NOTE — Assessment & Plan Note (Signed)
Depression and Anxiety They are on Auvelity and Lexapro, with reported exacerbation of symptoms during a recent medication lapse. We emphasized the importance of consistent use. We will ensure consistent use of Auvelity and Lexapro, and confirm medication refills and pick up from the pharmacy.

## 2023-08-01 ENCOUNTER — Other Ambulatory Visit (HOSPITAL_COMMUNITY): Payer: Self-pay

## 2023-08-05 ENCOUNTER — Ambulatory Visit: Payer: Medicaid Other | Admitting: Internal Medicine

## 2023-09-13 ENCOUNTER — Ambulatory Visit: Payer: Self-pay | Admitting: Internal Medicine

## 2023-09-13 NOTE — Telephone Encounter (Signed)
 Chief Complaint: Vomiting one occurrence Symptoms: Vomiting one occurrence Frequency: 1 time since this morning Pertinent Negatives: Patient denies fever Disposition: [] ED /[] Urgent Care (no appt availability in office) / [] Appointment(In office/virtual)/ []  [place] [place] [facility]/ [x] Home Care/ [] Refused Recommended Disposition /[] [place] [facility]/ []  Follow-up with PCP Additional Notes: Patient called in stating she had a vomiting episode this morning. Patient initially said she wanted to come into the office for evaluation. Patient states symptoms are 1 occurrence of vomiting, and is unsure if it is related to coworkers being sick recently with stomach bug or missing medication. Patient states she has missed a few days of her prescribed medications. Advised patient to attempt to get small bites of food in her stomach so she can resume her medication regimen. Advised patient on s/s of dehydration to look out for and call back if symptoms worsen.   Reason for Disposition  MILD or MODERATE vomiting (e.g., 1 - 5 times / day)  Answer Assessment - Initial Assessment Questions 1. VOMITING SEVERITY: "How many times have you vomited in the past 24 hours?"     - MILD:  1 - 2 times/day    - MODERATE: 3 - 5 times/day, decreased oral intake without significant weight loss or symptoms of dehydration    - SEVERE: 6 or more times/day, vomits everything or nearly everything, with significant weight loss, symptoms of dehydration      1 2. ONSET: "When did the vomiting begin?"      This morning 3. FLUIDS: "What fluids or food have you vomited up today?" "Have you been able to keep any fluids down?"     Yes drinking water 4. ABDOMEN PAIN: "Are your having any abdomen pain?" If Yes : "How bad is it and what does it feel like?" (e.g., crampy, dull, intermittent, constant)      Discomfort because of vomiting 5. DIARRHEA: "Is there any diarrhea?" If Yes, ask: "How many times today?"      No 6. CONTACTS:  "Is there anyone else in the family with the same symptoms?"      Yes 4 people at work have gotten sick 7. CAUSE: "What do you think is causing your vomiting?"     People at work have had stomach issues with illness 8. HYDRATION STATUS: "Any signs of dehydration?" (e.g., dry mouth [not only dry lips], too weak to stand) "When did you last urinate?"     No 9. OTHER SYMPTOMS: "Do you have any other symptoms?" (e.g., fever, headache, vertigo, vomiting blood or coffee grounds, recent head injury)     Dry heaving mainly clear  Protocols used: Vomiting-A-AH

## 2023-09-13 NOTE — Telephone Encounter (Addendum)
 This RN attempted return call attempt #2. No answer. LVM.  This RN attempted return call to patient. No answer. LVM.  Copied from CRM [id number]. Topic: Clinical - Pink Word Triage >> Sep 13, 2023  2:25 PM Fredrica W wrote: Reason for Triage: Keeps throwing up. Not feeling Well. Unable to go to work, needs note. Missed medication two days but took it this morning.

## 2023-09-17 ENCOUNTER — Ambulatory Visit: Payer: Medicaid Other | Admitting: Family Medicine

## 2023-09-17 ENCOUNTER — Encounter: Payer: Self-pay | Admitting: Family Medicine

## 2023-09-17 VITALS — BP 101/69 | HR 87 | Temp 96.6°F | Ht 65.0 in | Wt 197.6 lb

## 2023-09-17 DIAGNOSIS — R110 Nausea: Secondary | ICD-10-CM

## 2023-09-17 DIAGNOSIS — R197 Diarrhea, unspecified: Secondary | ICD-10-CM

## 2023-09-17 DIAGNOSIS — E669 Obesity, unspecified: Secondary | ICD-10-CM | POA: Diagnosis not present

## 2023-09-17 DIAGNOSIS — R8281 Pyuria: Secondary | ICD-10-CM

## 2023-09-17 DIAGNOSIS — N926 Irregular menstruation, unspecified: Secondary | ICD-10-CM | POA: Diagnosis not present

## 2023-09-17 LAB — POCT URINALYSIS DIPSTICK
Bilirubin, UA: POSITIVE
Blood, UA: NEGATIVE
Glucose, UA: NEGATIVE
Ketones, UA: POSITIVE
Nitrite, UA: NEGATIVE
Protein, UA: POSITIVE — AB
Spec Grav, UA: >=1.03 — AB (ref 1.010–1.025)
Urobilinogen, UA: NEGATIVE U/dL — AB
pH, UA: 6 (ref 5.0–8.0)

## 2023-09-17 LAB — POCT URINE PREGNANCY: Preg Test, Ur: NEGATIVE

## 2023-09-17 NOTE — Progress Notes (Signed)
   Natasha Pope is a 28 y.o. female who presents today for an office visit.  Assessment/Plan:  New/Acute Problems: Nausea / Vomiting / Diarrhea  Symptoms have essentially resolved at this point.  Based on history it is likely she may had have had a mild foodborne illness.  Her urine pregnancy test today was negative.  We encouraged hydration.  She did1 is have Zofran to use as needed.  She will let us know if symptoms do not continue to improve or if she has any worsening symptoms.  We discussed reasons to return to care.  Pyuria  Incidentally noted on UA today however she is not having any other signs or symptoms of UTI.  This may be related to her above gastroenteritis.  We will check urine culture before starting any antibiotics.  Delayed Period Patient reports that her period is late by week.  Her urine pregnancy test today is negative.  She does have history irregular periods.  Will defer further management to her PCP.  Chronic Problems Addressed Today: Obesity She is no longer on semaglutide-not think this is contributing to her above GI issues.  Recurrent nausea Has Zofran to use as needed.  Does not need a refill on this.     Subjective:  HPI:  Patient here with 4 days of diarrhea, nausea, and vomiting.  She believes it may be due to food poisoning.  She ate sushi the day prior to her symptoms. Fevers and chills. Symptoms are getting better. Still some diarrhea but improved. No urinary discomfort or urgency. She did try taking Zofran which did not help initially.  She has some myalgias as well initially.  This is also since resolved.       Objective:  Physical Exam: BP 101/69   Pulse 87   Temp (!) 96.6 F (35.9 C) (Temporal)   Ht 5\' 5"  (1.651 m)   Wt 197 lb 9.6 oz (89.6 kg)   LMP 08/07/2023   SpO2 100%   BMI 32.88 kg/m   Gen: No acute distress, resting comfortably CV: Regular rate and rhythm with no murmurs appreciated Pulm: Normal work of breathing,  clear to auscultation bilaterally with no crackles, wheezes, or rhonchi Neuro: Grossly normal, moves all extremities Psych: Normal affect and thought content      Natasha Mullan M. Jimmey Ralph, MD 09/17/2023 8:16 AM

## 2023-09-17 NOTE — Patient Instructions (Signed)
 It was very nice to see you today!  I am glad that you are feeling better.  We will check a urine sample to make sure that you do not have a UTI.  Please let us know if your symptoms return.  Return if symptoms worsen or fail to improve.   Take care, Dr Jimmey Ralph  PLEASE NOTE:  If you had any lab tests, please let us know if you have not heard back within a few days. You may see your results on mychart before we have a chance to review them but we will give you a call once they are reviewed by [place].   If we ordered any referrals today, please let us know if you have not heard from their office within the next week.   If you had any urgent prescriptions sent in today, please check with the pharmacy within an hour of our visit to make sure the prescription was transmitted appropriately.   Please try these tips to maintain a healthy lifestyle:  Eat at least 3 REAL meals and 1-2 snacks per day.  Aim for no more than 5 hours between eating.  If you eat breakfast, please do so within one hour of getting up.   Each meal should contain half fruits/vegetables, one quarter protein, and one quarter carbs (no bigger than a computer mouse)  Cut down on sweet beverages. This includes juice, soda, and sweet tea.   Drink at least 1 glass of water with each meal and aim for at least 8 glasses per day  Exercise at least 150 minutes every week.

## 2023-09-18 LAB — URINE CULTURE
MICRO NUMBER:: 16126130
SPECIMEN QUALITY:: ADEQUATE

## 2023-09-19 ENCOUNTER — Encounter: Payer: Self-pay | Admitting: Family Medicine

## 2023-09-19 ENCOUNTER — Telehealth: Payer: Self-pay

## 2023-09-19 NOTE — Telephone Encounter (Signed)
 See result note.  Natasha Pope. Jimmey Ralph, MD 09/19/2023 1:24 PM

## 2023-09-19 NOTE — Progress Notes (Signed)
 Her urine culture does not show any signs of UTI.  She should let us know if she is having any frequency or pain with urination.  Do not need to start any antibiotics at this point.

## 2023-09-19 NOTE — Telephone Encounter (Signed)
 Copied from CRM [id number]. Topic: Clinical - Lab/Test Results >> Sep 17, 2023 11:06 AM Natasha Pope wrote: Reason for CRM: Patient is requesting Dr. Rhys Martini nurse to call her to go over her urine culture results  from today 2/25 as she is unable to access MyChart.   Called patient back and informed her of the urine culture results, but also that Dr. Jimmey Ralph has not yet had a chance to review them and she should see his note in my chart when he has reviewed them. She let me know that she was able to get into her my chart this morning. Donzetta Starch, CMA

## 2023-09-26 ENCOUNTER — Ambulatory Visit: Payer: Self-pay | Admitting: Internal Medicine

## 2023-09-26 NOTE — Telephone Encounter (Signed)
 Patient scheduled with [facility] office 09/27/23 at 2 pm. Patient stated she was willing to be seen at different office.

## 2023-09-26 NOTE — Telephone Encounter (Signed)
   Chief Complaint: sore throat  Symptoms: cough, headache, congestion  Frequency: constant   Disposition: [] ED /[x] Urgent Care (no appt availability in office) / [] Appointment(In office/virtual)/ []  [facility]/ [] Home Care/ [] Refused Recommended Disposition /[] [facility]/ []  Follow-up with PCP Additional Notes: Pt complaining of sore throat, hoarse, cough, congestion, eye discharge,and  headache for 2-3 days. Pt denies fever. Pt needs to be be seen today. No [facility] have appts. RN advised pt to go to urgent care today. Pt verbalized understanding.            Copied from CRM [id number]. Topic: Clinical - Pink Word Triage >> Sep 26, 2023  8:51 AM Lovey Newcomer R wrote: Reason for Triage: Pt symptoms are worsening from her last OV, sore throat, mucous coming from nose, headache, stuff coming out of eyes, coughing. She was never prescribed anything for this. Has only been taking otc meds. Wants to come in for an appointment today and/or get some Tamiflu. Reason for Disposition  SEVERE (e.g., excruciating) throat pain  Answer Assessment - Initial Assessment Questions 1. ONSET: "When did the throat start hurting?" (Hours or days ago)      2-3 days ago  3. STREP EXPOSURE: "Has there been any exposure to strep within the past week?" If Yes, ask: "What type of contact occurred?"      No  4.  VIRAL SYMPTOMS: "Are there any symptoms of a cold, such as a runny nose, cough, hoarse voice or red eyes?"      Hoarse, cough, congestion  5. FEVER: "Do you have a fever?" If Yes, ask: "What is your temperature, how was it measured, and when did it start?"     Not sure  6. PUS ON THE TONSILS: "Is there pus on the tonsils in the back of your throat?"     Not sure  7. OTHER SYMPTOMS: "Do you have any other symptoms?" (e.g., difficulty breathing, headache, rash)     Headache, cough, eye discharge  Protocols used: Sore Throat-A-AH

## 2023-09-27 ENCOUNTER — Ambulatory Visit: Admitting: Family Medicine

## 2023-09-27 ENCOUNTER — Encounter: Payer: Self-pay | Admitting: Family Medicine

## 2023-09-27 VITALS — BP 100/60 | HR 80 | Temp 98.3°F | Ht 65.0 in | Wt 197.2 lb

## 2023-09-27 DIAGNOSIS — J029 Acute pharyngitis, unspecified: Secondary | ICD-10-CM

## 2023-09-27 DIAGNOSIS — J0100 Acute maxillary sinusitis, unspecified: Secondary | ICD-10-CM

## 2023-09-27 DIAGNOSIS — L309 Dermatitis, unspecified: Secondary | ICD-10-CM | POA: Diagnosis not present

## 2023-09-27 DIAGNOSIS — R519 Headache, unspecified: Secondary | ICD-10-CM

## 2023-09-27 LAB — POCT RAPID STREP A (OFFICE): Rapid Strep A Screen: NEGATIVE

## 2023-09-27 LAB — POC COVID19 BINAXNOW: SARS Coronavirus 2 Ag: NEGATIVE

## 2023-09-27 MED ORDER — METHYLPREDNISOLONE 4 MG PO TBPK: ORAL_TABLET | ORAL | 0 refills | Status: DC

## 2023-09-27 MED ORDER — AMOXICILLIN-POT CLAVULANATE 875-125 MG PO TABS: 1.0000 | ORAL_TABLET | Freq: Two times a day (BID) | ORAL | 0 refills | Status: DC

## 2023-09-27 NOTE — Progress Notes (Signed)
 Acute Office Visit  Subjective:     Patient ID: Natasha Pope, female    DOB: Apr 22, 1996, 28 y.o.   MRN: [id number]  Chief Complaint  Patient presents with   Sore Throat    X3 days, tried Theraflu and Dayquil    Emesis    X10 days ago, suspected food poisioning after eating Sushi, green and yellow sputum   Headache    X1 week   Chest Pain    Patient complains of chest "heaviness" x2 days   Urticaria    X2 days    Sore Throat  Associated symptoms include headaches and vomiting.  Emesis  Associated symptoms include chest pain and headaches.  Headache  Associated symptoms include vomiting.  Chest Pain  Associated symptoms include headaches and vomiting.  Urticaria Associated symptoms include vomiting.   Patient is in today for multiple acute symptoms. She reports that she had eaten some sushi, then woke up the next day with lots of vomiting, this continued for a few days, then also had diarrhea. Then later after that she developed a sore throat, nasal congestion, ear pain, some chills but these have gotten somewhat better. 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   She was initially seen by Dr. Jimmey Ralph on 2/25, was given zofran and over time her vomiting and diarrhea resolved. Now for the past 2 days she has been breaking out into a rash, on her arms, face and legs. It is very itchy, somewhat raised and erythematous.   Pt is also complaining of chest heaviness, has been coughing a lot during this illness. No tightness or SOB.   Review of Systems  Cardiovascular:  Positive for chest pain.  Gastrointestinal:  Positive for vomiting.  Neurological:  Positive for headaches.  All other systems reviewed and are negative.       Objective:    BP 100/60   Pulse 80   Temp 98.3 F (36.8 C) (Oral)   Ht 5\' 5"  (1.651 m)   Wt 197 lb 3.2 oz (89.4 kg)   LMP 09/22/2023 (Exact Date)   SpO2 98%   BMI 32.82 kg/m    Physical  Exam Vitals reviewed.  Constitutional:      Appearance: She is well-developed. She is obese.     Comments: Pt   HENT:     Right Ear: Tympanic membrane and ear canal normal.     Left Ear: Tympanic membrane and ear canal normal.     Nose: Congestion present.     Mouth/Throat:     Mouth: Mucous membranes are moist.     Pharynx: No posterior oropharyngeal erythema.  Eyes:     General:        Right eye: Discharge (watery) present.        Left eye: Discharge present.    Conjunctiva/sclera:     Right eye: Right conjunctiva is not injected.     Left eye: Left conjunctiva is not injected.  Neck:     Thyroid: No thyromegaly.  Cardiovascular:     Rate and Rhythm: Normal rate and regular rhythm.     Heart sounds: Normal heart sounds. No murmur heard. Pulmonary:     Effort: Pulmonary effort is normal.     Breath sounds: Normal breath sounds. No wheezing, rhonchi or rales.  Lymphadenopathy:     Cervical: No cervical adenopathy.  Skin:    Findings: Rash present.  Neurological:  Mental Status: She is alert and oriented to person, place, and time.     Results for orders placed or performed in visit on 09/27/23  POC COVID-19  Result Value Ref Range   SARS Coronavirus 2 Ag Negative Negative  POC Rapid Strep A  Result Value Ref Range   Rapid Strep A Screen Negative Negative        Assessment & Plan:   Problem List Items Addressed This Visit   None Visit Diagnoses       Sore throat    -  Primary   Relevant Orders   POC COVID-19 (Completed)   POC Rapid Strep A (Completed)     Nonintractable headache, unspecified chronicity pattern, unspecified headache type       Relevant Orders   POC COVID-19 (Completed)   POC Rapid Strep A (Completed)     Dermatitis       Relevant Medications   methylPREDNISolone (MEDROL DOSEPAK) 4 MG TBPK tablet     Acute non-recurrent maxillary sinusitis       Relevant Medications   methylPREDNISolone (MEDROL DOSEPAK) 4 MG TBPK tablet    amoxicillin-clavulanate (AUGMENTIN) 875-125 MG tablet     Covid and strep negative, this is an interesting case. I am not sure if the same pathogen is causing both the GI symptoms and the URI symptoms, it seems to be 2 different issues -- that the GI symptoms improved/resolved before the URI symptoms started. Also the rash is somewhat odd in its distribution-- it is on the palms of her hands and is hyperpigmented, flaky. The areas appear round and well circumscribed. There are no lesions on her chest or back, only on the arms BL, the neck and tops of the thighs. I suspect that this is an atopic dermatitis given its distribution on the body, pt reports she has had this rash before with other viral illnesses she has had in the past. Since she is presenting with persistent sinus congestion symptoms that have not improved in the past week, will treat empirically with augmentin for acute sinus infection and will give a medrol dose pak for the rash. She should return to clinic if her symptoms do not improve.  Meds ordered this encounter  Medications   methylPREDNISolone (MEDROL DOSEPAK) 4 MG TBPK tablet    Sig: Take package as directed.    Dispense:  21 each    Refill:  0   amoxicillin-clavulanate (AUGMENTIN) 875-125 MG tablet    Sig: Take 1 tablet by mouth 2 (two) times daily.    Dispense:  20 tablet    Refill:  0    Return if symptoms worsen or fail to improve.  Karie Georges, MD

## 2023-11-01 ENCOUNTER — Other Ambulatory Visit: Payer: Self-pay | Admitting: Internal Medicine

## 2023-11-01 DIAGNOSIS — F4001 Agoraphobia with panic disorder: Secondary | ICD-10-CM

## 2023-11-01 DIAGNOSIS — F331 Major depressive disorder, recurrent, moderate: Secondary | ICD-10-CM

## 2023-11-01 DIAGNOSIS — F411 Generalized anxiety disorder: Secondary | ICD-10-CM

## 2023-12-23 ENCOUNTER — Other Ambulatory Visit: Payer: Self-pay | Admitting: Internal Medicine

## 2023-12-23 DIAGNOSIS — F5105 Insomnia due to other mental disorder: Secondary | ICD-10-CM

## 2023-12-23 NOTE — Telephone Encounter (Signed)
 Last Fill: 06/06/23  Last OV: 07/22/23 Next OV: 12/25/23  Routing to provider for review/authorization.

## 2023-12-23 NOTE — Telephone Encounter (Signed)
 Copied from CRM [id number]. Topic: Clinical - Medication Refill >> Dec 23, 2023  1:33 PM [name] P wrote: Medication: QUEtiapine  (SEROQUEL ) 100 MG tablet  Has the patient contacted their pharmacy? Yes (Agent: If no, request that the patient contact the pharmacy for the refill. If patient does not wish to contact the pharmacy document the reason why and proceed with request.) (Agent: If yes, when and what did the pharmacy advise?)  This is the patient's preferred pharmacy:  [facility] [address] [place] - [address] [place] [address] Phone: [phone] Fax: [phone]   Is this the correct pharmacy for this prescription? Yes If no, delete pharmacy and type the correct one.   Has the prescription been filled recently? No  Is the patient out of the medication? Yes  Has the patient been seen for an appointment in the last year OR does the patient have an upcoming appointment? No  Can we respond through MyChart? Yes  Agent: Please be advised that Rx refills may take up to 3 business days. We ask that you follow-up with your pharmacy.

## 2023-12-24 MED ORDER — QUETIAPINE FUMARATE 100 MG PO TABS: 100.0000 mg | ORAL_TABLET | Freq: Two times a day (BID) | ORAL | 0 refills | Status: DC

## 2023-12-25 ENCOUNTER — Ambulatory Visit: Admitting: Internal Medicine

## 2023-12-25 ENCOUNTER — Encounter: Payer: Self-pay | Admitting: Internal Medicine

## 2023-12-25 VITALS — BP 108/74 | HR 86 | Temp 98.0°F | Ht 65.0 in | Wt 184.4 lb

## 2023-12-25 DIAGNOSIS — Z87898 Personal history of other specified conditions: Secondary | ICD-10-CM | POA: Insufficient documentation

## 2023-12-25 DIAGNOSIS — G4750 Parasomnia, unspecified: Secondary | ICD-10-CM | POA: Insufficient documentation

## 2023-12-25 DIAGNOSIS — E669 Obesity, unspecified: Secondary | ICD-10-CM

## 2023-12-25 DIAGNOSIS — R441 Visual hallucinations: Secondary | ICD-10-CM | POA: Diagnosis not present

## 2023-12-25 DIAGNOSIS — L308 Other specified dermatitis: Secondary | ICD-10-CM | POA: Insufficient documentation

## 2023-12-25 DIAGNOSIS — F12980 Cannabis use, unspecified with anxiety disorder: Secondary | ICD-10-CM | POA: Diagnosis not present

## 2023-12-25 DIAGNOSIS — F4001 Agoraphobia with panic disorder: Secondary | ICD-10-CM

## 2023-12-25 DIAGNOSIS — F411 Generalized anxiety disorder: Secondary | ICD-10-CM | POA: Diagnosis not present

## 2023-12-25 MED ORDER — ESCITALOPRAM OXALATE 10 MG PO TABS: 10.0000 mg | ORAL_TABLET | Freq: Every day | ORAL | 3 refills | Status: DC

## 2023-12-25 MED ORDER — PROPRANOLOL HCL ER 60 MG PO CP24: 60.0000 mg | ORAL_CAPSULE | Freq: Every day | ORAL | 3 refills | Status: AC

## 2023-12-25 MED ORDER — LEVOCETIRIZINE DIHYDROCHLORIDE 5 MG PO TABS: 5.0000 mg | ORAL_TABLET | Freq: Every evening | ORAL | 3 refills | Status: AC

## 2023-12-25 NOTE — Progress Notes (Signed)
 ==============================  [facility]: [phone]   -- Medical Office Visit --  Patient: Natasha Pope       Age: 28 y.o.       Sex:  female  Date:   12/25/2023 Today's Healthcare Provider: Anthon Kins, MD  ==============================   Chief Complaint: Medication Refill She came for check-in mainly for seroquel  refills.  She is getting all of psychiatry medications managed here and not following with psychiatry (has been advised)  Discussed the use of AI scribe software for clinical note transcription with the patient, who gave verbal consent to proceed. History of Present Illness 28 year old female with insomnia and depression who presents with sleep disturbances and skin issues.  She experiences significant sleep disturbances, including falling out of bed during sleep and waking up on the floor without recollection. She has severe problems sleeping and experiences talking in her sleep, which wakes her up confused. She uses Seroquel  at night to aid sleep.  She has severe itching and changes in her skin, including small splotches and dry patches. Her skin issues began after moving in with her boyfriend, who owns a cat, to which she is allergic. She uses Vaseline to manage her skin symptoms.  She has a history of depression and is currently taking Auvelity , buspirone , and Lexapro . She feels 'all right' regarding her depression and denies significant depressive symptoms during a recent screening. She experiences mild sleep and energy issues.  She has resumed smoking cannabis and denies using other substances such as methamphetamine or mushrooms.  Her current medications include Auvelity , buspirone , Lexapro , Seroquel , propranolol , and as-needed use of Zofran  and Prilosec. She has stopped taking acamprosate , naltrexone , and trazodone . She recently received a refill for her birth control and does not require a new prescription at  this time.  Background Reviewed: Problem List: has Nicotine  dependence due to vaping tobacco product; Asthma; GAD (generalized anxiety disorder); ASCUS of cervix with negative high risk HPV; Macrocytosis; Substance induced mood disorder (HCC); Alcohol use with alcohol-induced mood disorder (HCC); Alcohol use disorder, severe, in early remission, dependence (HCC); Recurrent major depressive disorder (HCC); Panic disorder with agoraphobia; Delusional disorder (HCC); Auditory hallucinations; Visual hallucination; Chronic abdominal pain; PTSD (post-traumatic stress disorder); Memory loss; Snoring; Obesity (BMI 30-39.9); Financial difficulties; Gastroenteritis; Contraceptive management; GERD (gastroesophageal reflux disease); Migraine; Nausea; History of alcohol use disorder; Cannabis use with anxiety disorder (HCC); Parasomnia; and Pruritic dermatitis on their problem list. Past Medical History:  has a past medical history of Anxiety, Asthma, Depression, Gastroenteritis (10/22/2022), History of alcohol withdrawal delirium (07/04/2022), History of seizure due to alcohol withdrawal (02/28/2021), Memory loss (04/03/2022), Nausea vomiting and diarrhea (06/27/2022), Nicotine  dependence due to vaping tobacco product (01/31/2018), 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 (04/17/2022), PTSD (post-traumatic stress disorder) (03/19/2022), Pyelonephritis, Seizure-like activity (HCC), Seizures (HCC), Sepsis (HCC), Snoring (04/17/2022), Thrombocytopenia (HCC) (05/19/2018), Unwanted pregnancy (11/26/2022), and Withdrawal syndrome (HCC) (09/27/2022). Past Surgical History:   has a past surgical history that includes dental procedure. Social History:   reports that she has been smoking cigarettes and e-cigarettes. She has never used smokeless tobacco. She reports that she does not currently use alcohol. She reports that she does not currently use drugs after having used the following drugs: Marijuana. Family History:  family history  includes Alcohol abuse in her father, maternal grandfather, and paternal grandmother; Depression in her mother; Hyperlipidemia in her maternal grandmother; Hypertension in her father and paternal grandfather; Learning disabilities in her sister; Miscarriages / Stillbirths in her mother;  Thyroid  disease in her mother. Allergies:  is allergic to lamotrigine  and pineapple.   Medication Reconciliation: Current Outpatient Medications on File Prior to Visit  Medication Sig   busPIRone  (BUSPAR ) 15 MG tablet TAKE ONE TABLET BY MOUTH THREE TIMES DAILY (MUST BE TAKEN CONSISTENTLY)   Dextromethorphan -buPROPion  ER (AUVELITY ) 45-105 MG TBCR Take 1 tablet by mouth 2 (two) times daily. To start, take just one tablet once daily in the morning. After 3 days, increase to the maximum recommended dosage of one tablet twice daily, given at least 8 hours apart. Do not exceed two doses within the same day.   norethindrone-ethinyl estradiol-FE (HAILEY FE 1/20) 1-20 MG-MCG tablet Take 1 tablet by mouth daily.   omeprazole  (PRILOSEC) 20 MG capsule Take 1 capsule (20 mg total) by mouth daily.   ondansetron  (ZOFRAN ) 4 MG tablet Take 1 tablet (4 mg total) by mouth every 8 (eight) hours as needed for nausea or vomiting.   ondansetron  (ZOFRAN -ODT) 4 MG disintegrating tablet Take 1 tablet (4 mg total) by mouth every 8 (eight) hours as needed for nausea or vomiting.   propranolol  (INDERAL ) 20 MG tablet Take 1 tablet (20 mg total) by mouth 3 (three) times daily.   QUEtiapine  (SEROQUEL ) 100 MG tablet TAKE ONE-HALF TABLET (50MG ) BY MOUTH IN THE MORNING AND ONE AND ONE-HALF TABLET (150MG ) IN THE EVENING *STOP DULOXETINE  AND TRAZODONE * (OK TO TAKE WITH ELEVATE)   QUEtiapine  (SEROQUEL ) 100 MG tablet Take 1 tablet (100 mg total) by mouth 2 (two) times daily. Take as 50 mg in morning, 150 mg in evening.  Stop duloxetine  and trazodone . Ok to take with elevate.   rizatriptan  (MAXALT ) 5 MG tablet Take 5 mg by mouth as needed for migraine. May  repeat in 2 hours if needed   thiamine  (VITAMIN B-1) 100 MG tablet Take 1 tablet (100 mg total) by mouth daily.   [DISCONTINUED] Dextromethorphan  HBr 15 MG TABS Take 3 tablets by mouth daily at 6 (six) AM.   No current facility-administered medications on file prior to visit.   Medications Discontinued During This Encounter  Medication Reason   acamprosate  (CAMPRAL ) 333 MG tablet Completed Course   amoxicillin -clavulanate (AUGMENTIN ) 875-125 MG tablet Completed Course   escitalopram  (LEXAPRO ) 20 MG tablet Not covered by the pt's insurance   magic mouthwash (nystatin , lidocaine , diphenhydrAMINE , alum & mag hydroxide) suspension Completed Course   methocarbamol  (ROBAXIN ) 500 MG tablet Completed Course   methylPREDNISolone  (MEDROL  DOSEPAK) 4 MG TBPK tablet Completed Course   Multiple Vitamin (MULTIVITAMIN WITH MINERALS) TABS tablet Completed Course   naltrexone  (DEPADE) 50 MG tablet Completed Course   traZODone  (DESYREL ) 100 MG tablet Completed Course     Physical Exam:    12/25/2023   11:15 AM 09/27/2023    1:49 PM 09/17/2023    7:28 AM  Vitals with BMI  Height 5\' 5"  5\' 5"  5\' 5"   Weight 184 lbs 6 oz 197 lbs 3 oz 197 lbs 10 oz  BMI 30.69 32.82 32.88  Systolic 108 100 161  Diastolic 74 60 69  Pulse 86 80 87  Vital signs reviewed.  Nursing notes reviewed. Weight trend reviewed. Physical Exam General Appearance:  No acute distress appreciable.   Well-groomed, healthy-appearing female.  Well proportioned with no abnormal fat distribution.  Good muscle tone. Pulmonary:  Normal work of breathing at rest, no respiratory distress apparent. SpO2: 98 %  Musculoskeletal: All extremities are intact.  Neurological:  Awake, alert, oriented, and engaged.  No obvious focal neurological deficits or cognitive impairments.  Sensorium seems unclouded.   Speech is clear and coherent with logical content. Psychiatric:  Appropriate mood, pleasant and cooperative demeanor, thoughtful and engaged during the  exam Physical Exam MEASUREMENTS: Weight- 184. HEENT: Forehead with a palpable knot. SKIN: Skin with multiple dry splotches and itching.    No results found for any visits on 12/25/23. Office Visit on 09/27/2023  Component Date Value   SARS Coronavirus 2 Ag 09/27/2023 Negative    Rapid Strep A Screen 09/27/2023 Negative   Office Visit on 09/17/2023  Component Date Value   Preg Test, Ur 09/17/2023 Negative    Color, UA 09/17/2023 yellow    Clarity, UA 09/17/2023 cloudy    Glucose, UA 09/17/2023 Negative    Bilirubin, UA 09/17/2023 positive    Ketones, UA 09/17/2023 positive    Spec Grav, UA 09/17/2023 >=1.030 (A)    Blood, UA 09/17/2023 negative    pH, UA 09/17/2023 6.0    Protein, UA 09/17/2023 Positive (A)    Urobilinogen, UA 09/17/2023 negative (A)    Nitrite, UA 09/17/2023 negative    Leukocytes, UA 09/17/2023 Trace (A)    MICRO NUMBER: 09/17/2023 [id number]    SPECIMEN QUALITY: 09/17/2023 Adequate    Sample Source 09/17/2023 URINE    STATUS: 09/17/2023 FINAL    Result: 09/17/2023                     Value:Mixed genital flora isolated. These superficial bacteria are not indicative of a urinary tract infection. No further organism identification is warranted on this specimen. If clinically indicated, recollect clean-catch, mid-stream urine and transfer  immediately to Urine Culture Transport Tube.   Office Visit on 06/17/2023  Component Date Value   Rapid Strep A Screen 06/17/2023 Negative    Influenza A, POC 06/17/2023 Negative    Influenza B, POC 06/17/2023 Negative    SARS Coronavirus 2 Ag 06/17/2023 Negative   Office Visit on 02/01/2023  Component Date Value   Rapid Strep A Screen 02/01/2023 Negative    SARS Coronavirus 2 Ag 02/01/2023 Negative    Glucose, Bld 02/01/2023 99    BUN 02/01/2023 8    Creat 02/01/2023 0.99 (H)    BUN/Creatinine Ratio 02/01/2023 8    Sodium 02/01/2023 134 (L)    Potassium 02/01/2023 3.0 (L)    Chloride 02/01/2023 90 (L)    CO2  02/01/2023 24    Calcium  02/01/2023 9.6    Total Protein 02/01/2023 8.2 (H)    Albumin 02/01/2023 5.0    Globulin 02/01/2023 3.2    AG Ratio 02/01/2023 1.6    Total Bilirubin 02/01/2023 0.9    Alkaline phosphatase (AP* 02/01/2023 149 (H)    AST 02/01/2023 107 (H)    ALT 02/01/2023 52 (H)    Hgb A1c MFr Bld 02/01/2023 5.1    Mean Plasma Glucose 02/01/2023 100    eAG (mmol/L) 02/01/2023 5.5   No image results found. No results found.      12/25/2023   11:20 AM 09/17/2023    7:38 AM 07/22/2023    1:14 PM 03/20/2023    4:08 PM  PHQ 2/9 Scores  PHQ - 2 Score 0 0 1 2  PHQ- 9 Score 3 0 3 7    Assessment & Plan Parasomnia, unspecified type She experiences significant sleep disturbances, including falling out of bed, waking up on the floor, and talking in her sleep, consistent with parasomnia. These may be exacerbated by her current medications, particularly the combination of buspirone , Auvelity , and Lexapro , which  can increase serotonin levels. Reduce Lexapro  to 10 mg daily to potentially alleviate these symptoms. Generalized anxiety disorder Her depression is well-managed with buspirone , Auvelity , and Lexapro . However, due to the potential contribution of these medications to her sleep disturbances, reduce Lexapro  to 10 mg daily.  She continues to experience anxiety, managed with propranolol . Her blood pressure is well-controlled. Discussed switching to an extended-release formulation to simplify her medication regimen. Switch to propranolol  extended-release formulation to improve medication adherence and simplify dosing. Panic disorder with agoraphobia Switch propranolol  to long acting for easier dosing Visual hallucination Have resolved so we should consider tapering seroquel  but she has been using it for intermittent insomnia.   Will continue it a bit longer.  Rtfu 75m Cannabis use with anxiety disorder (HCC) Discouraged cannabis use History of alcohol use disorder She made a personal  choice to discontinue all medications, reports this is in remission. Pruritic dermatitis She reports significant pruritus and skin changes, potentially related to an allergy. She recently moved in with her boyfriend, who has a cat, and she is known to be allergic to cats. Considered other allergens, including environmental factors and cannabis smoke. Start a non-drowsy antihistamine for allergy management. Consider environmental modifications, such as reducing exposure to the cat and ensuring a clean living environment, to identify and eliminate potential allergens. Obesity (BMI 30-39.9) She reports weight loss and is currently at 184 pounds. Encouraged continued efforts towards weight management. Encourage healthy lifestyle choices to support further weight loss.      Orders Placed During this Encounter:   Meds ordered this encounter  Medications   escitalopram  (LEXAPRO ) 10 MG tablet    Sig: Take 1 tablet (10 mg total) by mouth daily. Replaces 20 mg dosing.    Dispense:  90 tablet    Refill:  3   levocetirizine (XYZAL) 5 MG tablet    Sig: Take 1 tablet (5 mg total) by mouth every evening.    Dispense:  90 tablet    Refill:  3   propranolol  ER (INDERAL  LA) 60 MG 24 hr capsule    Sig: Take 1 capsule (60 mg total) by mouth daily.    Dispense:  90 capsule    Refill:  3     This document was synthesized by artificial intelligence ([employer]bridge) using HIPAA-compliant recording of the clinical interaction;   We discussed the use of AI scribe software for clinical note transcription with the patient, who gave verbal consent to proceed. additional Info: This encounter employed state-of-the-art, real-time, collaborative documentation. The patient actively reviewed and assisted in updating their electronic medical record on a shared screen, ensuring transparency and facilitating joint problem-solving for the problem list, overview, and plan. This approach promotes accurate, informed care. The treatment plan  was discussed and reviewed in detail, including medication safety, potential side effects, and all patient questions. We confirmed understanding and comfort with the plan. Follow-up instructions were established, including contacting the office for any concerns, returning if symptoms worsen, persist, or new symptoms develop, and precautions for potential emergency department visits.

## 2023-12-25 NOTE — Assessment & Plan Note (Signed)
 Discouraged cannabis use

## 2023-12-25 NOTE — Assessment & Plan Note (Signed)
 She reports significant pruritus and skin changes, potentially related to an allergy. She recently moved in with her boyfriend, who has a cat, and she is known to be allergic to cats. Considered other allergens, including environmental factors and cannabis smoke. Start a non-drowsy antihistamine for allergy management. Consider environmental modifications, such as reducing exposure to the cat and ensuring a clean living environment, to identify and eliminate potential allergens.

## 2023-12-25 NOTE — Assessment & Plan Note (Signed)
 She made a personal choice to discontinue all medications, reports this is in remission.

## 2023-12-25 NOTE — Patient Instructions (Addendum)
 It was a pleasure seeing you today! Your health and satisfaction are our top priorities.  Scherrie Curt, MD  Your Providers PCP: Anthon Kins, MD,  [phone]) Referring Provider: Anthon Kins, MD,  [phone]) Care Team Provider: Jannifer Mention, MD  VISIT SUMMARY:  During your visit, we discussed your sleep disturbances, skin issues, depression, cannabis use, allergies, anxiety, and weight management. We reviewed your current medications and made some adjustments to help manage your symptoms more effectively.  YOUR PLAN:  -SLEEP DISTURBANCES WITH PARASOMNIA: You are experiencing significant sleep disturbances, including falling out of bed and talking in your sleep, which may be related to your medications. We will reduce your Lexapro  dose to 10 mg daily to see if this helps alleviate these symptoms.  -RECURRENT MAJOR DEPRESSIVE DISORDER: Your depression is currently well-managed with your medications, but we will reduce your Lexapro  dose to 10 mg daily to help with your sleep disturbances.  -CANNABIS USE: You have resumed using cannabis. We discussed the risks, including its potential to worsen sleep disturbances and act as a gateway drug. It is important to avoid alcohol and consider reducing or stopping cannabis use to support your overall health.  -ALLERGIC REACTIONS WITH PRURITUS: You are experiencing itching and skin changes, likely due to an allergy to your boyfriend's cat. We recommend starting a non-drowsy antihistamine and making environmental changes to reduce your exposure to allergens.  -GENERALIZED ANXIETY DISORDER: Your anxiety is managed with propranolol , and your blood pressure is well-controlled. We will switch you to an extended-release formulation of propranolol  to simplify your medication regimen.  -OBESITY DUE TO ENERGY IMBALANCE: You have lost weight and are currently at 184 pounds. Continue making healthy lifestyle choices to support further weight  loss.  INSTRUCTIONS:  Please follow up as needed to monitor your response to the medication adjustments and any changes in your symptoms. If you experience any new or worsening symptoms, contact our office.    NEXT STEPS: [x]  Early Intervention: Schedule sooner appointment, call our on-call services, or go to emergency room if there is any significant Increase in pain or discomfort New or worsening symptoms Sudden or severe changes in your health [x]  Flexible Follow-Up: We recommend a Return in about 1 month (around 01/24/2024). for optimal routine care. This allows for progress monitoring and treatment adjustments. [x]  Preventive Care: Schedule your annual preventive care visit! It's typically covered by insurance and helps identify potential health issues early. [x]  Lab & X-ray Appointments: Incomplete tests scheduled today, or call to schedule. X-rays: [facility] (M-F, 8:30am-noon or 1pm-5pm). [x]  Medical Information Release: Sign a release form at front desk to obtain relevant medical information we don't have.  MAKING THE MOST OF OUR FOCUSED 20 MINUTE APPOINTMENTS: [x]   Clearly state your top concerns at the beginning of the visit to focus our discussion [x]   If you anticipate you will need more time, please inform the front desk during scheduling - we can book multiple appointments in the same week. [x]   If you have transportation problems- use our convenient video appointments or ask about transportation support. [x]   We can get down to business faster if you use MyChart to update information before the visit and submit non-urgent questions before your visit. Thank you for taking the time to provide details through MyChart.  Let our nurse know and she can import this information into your encounter documents.  Arrival and Wait Times: [x]   Arriving on time ensures that everyone receives prompt attention. [  x]  Early morning (8a) and afternoon (1p) appointments tend to  have shortest wait times. [x]   Unfortunately, we cannot delay appointments for late arrivals or hold slots during phone calls.  Getting Answers and Following Up [x]   Simple Questions & Concerns: For quick questions or basic follow-up after your visit, reach us  at [phone] or MyChart messaging. [x]   Complex Concerns: If your concern is more complex, scheduling an appointment might be best. Discuss this with the staff to find the most suitable option. [x]   Lab & Imaging Results: We'll contact you directly if results are abnormal or you don't use MyChart. Most normal results will be on MyChart within 2-3 business days, with a review message from Dr. Boston Byers. Haven't heard back in 2 weeks? Need results sooner? Contact us  at [phone]. [x]   Referrals: Our referral coordinator will manage specialist referrals. The specialist's office should contact you within 2 weeks to schedule an appointment. Call us  if you haven't heard from them after 2 weeks.  Staying Connected [x]   MyChart: Activate your MyChart for the fastest way to access results and message us . See the last page of this paperwork for instructions on how to activate.  Bring to Your Next Appointment [x]   Medications: Please bring all your medication bottles to your next appointment to ensure we have an accurate record of your prescriptions. [x]   Health Diaries: If you're monitoring any health conditions at home, keeping a diary of your readings can be very helpful for discussions at your next appointment.  Billing [x]   X-ray & Lab Orders: These are billed by separate companies. Contact the invoicing company directly for questions or concerns. [x]   Visit Charges: Discuss any billing inquiries with our administrative services team.  Your Satisfaction Matters [x]   Share Your Experience: We strive for your satisfaction! If you have any complaints, or preferably compliments, please let Dr. Boston Byers know directly or contact our Practice  Administrators, Olinda Bertrand or [employer], by asking at the front desk.   Reviewing Your Records [x]   Review this early draft of your clinical encounter notes below and the final encounter summary tomorrow on MyChart after its been completed.  All orders placed so far are visible here: Parasomnia, unspecified type  Generalized anxiety disorder -     Escitalopram  Oxalate; Take 1 tablet (10 mg total) by mouth daily. Replaces 20 mg dosing.  Dispense: 90 tablet; Refill: 3  Panic disorder with agoraphobia -     Propranolol  HCl ER; Take 1 capsule (60 mg total) by mouth daily.  Dispense: 90 capsule; Refill: 3  Visual hallucination  Cannabis use with anxiety disorder (HCC)  History of alcohol use disorder  Pruritic dermatitis -     Levocetirizine Dihydrochloride; Take 1 tablet (5 mg total) by mouth every evening.  Dispense: 90 tablet; Refill: 3  Obesity (BMI 30-39.9)

## 2023-12-25 NOTE — Assessment & Plan Note (Signed)
 Have resolved so we should consider tapering seroquel  but she has been using it for intermittent insomnia.   Will continue it a bit longer.  Rtfu 44m

## 2023-12-25 NOTE — Assessment & Plan Note (Signed)
 She experiences significant sleep disturbances, including falling out of bed, waking up on the floor, and talking in her sleep, consistent with parasomnia. These may be exacerbated by her current medications, particularly the combination of buspirone , Auvelity , and Lexapro , which can increase serotonin levels. Reduce Lexapro  to 10 mg daily to potentially alleviate these symptoms.

## 2023-12-25 NOTE — Assessment & Plan Note (Signed)
 Switch propranolol  to long acting for easier dosing

## 2023-12-25 NOTE — Assessment & Plan Note (Signed)
 She reports weight loss and is currently at 184 pounds. Encouraged continued efforts towards weight management. Encourage healthy lifestyle choices to support further weight loss.

## 2024-01-06 ENCOUNTER — Ambulatory Visit: Payer: Self-pay

## 2024-01-06 NOTE — Telephone Encounter (Signed)
 FYI Only or Action Required?: Action required by provider      Patient called into triage, stating she has anxiety, and increased panic attacks that are being treated, however, she declined triage, and states she is not worried about the anxiety, she wants to know why her pregnancy test is positive. She is insistent on being scheduled tomorrow 6/17. She was offered the soonest appointment time which was 6/18. She does not wish to wait until Wednesday. She disconnected when I was calling [facility].   Triage Disposition: Call PCP When Office is Open  Patient/caregiver understands and will follow disposition?: No, wishes to speak with PCP        Copied from CRM [id number]. Topic: Clinical - Red Word Triage >> Jan 06, 2024  5:11 PM [place] J wrote: [facility] that prompted transfer to Nurse Triage: Patient took a pregnancy test that came back positive and is having panic attacks. She has questions about the medication she's taking while being pregnant. Reason for Disposition  [1] Caller requesting NON-URGENT health information AND [2] PCP's office is the best resource  Answer Assessment - Initial Assessment Questions She is not concerned with the anxiety, she is concerned about the pregnancy test being positive.  Answer Assessment - Initial Assessment Questions 1. REASON FOR CALL or QUESTION: What is your reason for calling today? or How can I best help you? or What question do you have that I can help answer?     Patient called into triage, stating she has anxiety, and increased panic attacks that are being treated, however, she declined triage, and states she is not worried about the anxiety, she wants to know why her pregnancy test is positive. She is insistent on being scheduled tomorrow 6/17. She was offered the soonest appointment time which was 6/18. She does not wish to wait until Wednesday. She disconnected when I was calling [facility].  Protocols used: Anxiety and Panic Attack-A-AH, Information  Only Call - No Triage-A-AH

## 2024-01-07 ENCOUNTER — Encounter: Payer: Self-pay | Admitting: Family Medicine

## 2024-01-07 ENCOUNTER — Ambulatory Visit (INDEPENDENT_AMBULATORY_CARE_PROVIDER_SITE_OTHER): Admitting: Family Medicine

## 2024-01-07 VITALS — BP 116/78 | HR 115 | Temp 98.4°F | Ht 65.0 in | Wt 181.0 lb

## 2024-01-07 DIAGNOSIS — Z3201 Encounter for pregnancy test, result positive: Secondary | ICD-10-CM

## 2024-01-07 DIAGNOSIS — N644 Mastodynia: Secondary | ICD-10-CM | POA: Diagnosis not present

## 2024-01-07 LAB — POCT URINE PREGNANCY: Preg Test, Ur: POSITIVE — AB

## 2024-01-07 NOTE — Patient Instructions (Addendum)
-  It was nice to care for you today.  -Urine pregnancy was positive.  -Unfortunately, you left before I could return back into the room. A referral to gynecology has been placed. Please call the office or send a MyChart message if you do not receive a phone call or a MyChart message about appointment in 2 weeks.  -I wish you well with your pregnancy!

## 2024-01-07 NOTE — Progress Notes (Signed)
   Acute Office Visit   Subjective:  Patient ID: Natasha Pope , female    DOB: 1996/03/13, 28 y.o.   MRN: [id number]  Chief Complaint  Patient presents with   Acute Visit    Pregnancy concerns.    HPI Patient is here for an acute visit. She reports she took a home urine pregnancy test yesterday and it came back positive. She wanting confirmation today. Patient reports she is experiencing breast tenderness, nausea with vomiting, abd bloating, constipation, and some fatigue.    ROS See HPI above      Objective:   BP 116/78   Pulse (!) 115   Temp 98.4 F (36.9 C) (Oral)   Ht 5' 5 (1.651 m)   Wt 181 lb (82.1 kg)   LMP 12/15/2023 (Approximate)   SpO2 98%   BMI 30.12 kg/m    Physical Exam Vitals reviewed.  Constitutional:      General: She is not in acute distress.    Appearance: Normal appearance. She is obese. She is not ill-appearing, toxic-appearing or diaphoretic.  HENT:     Head: Normocephalic and atraumatic.   Eyes:     General:        Right eye: No discharge.        Left eye: No discharge.     Conjunctiva/sclera: Conjunctivae normal.    Cardiovascular:     Rate and Rhythm: Normal rate.  Pulmonary:     Effort: Pulmonary effort is normal. No respiratory distress.   Musculoskeletal:        General: Normal range of motion.   Skin:    General: Skin is warm and dry.   Neurological:     General: No focal deficit present.     Mental Status: She is alert and oriented to person, place, and time. Mental status is at baseline.     Gait: Gait normal.   Psychiatric:        Behavior: Behavior is cooperative.     Results for orders placed or performed in visit on 01/07/24  POCT urine pregnancy  Result Value Ref Range   Preg Test, Ur Positive (A) Negative      Assessment & Plan:  Positive urine pregnancy test -     Ambulatory referral to Gynecology  Breast tenderness in female -     POCT urine pregnancy  -Urine pregnancy was positive.   -Initially seen patient prior to her providing urine sample for pregnancy test. While in the room with another patient, she had a pregnancy test complete and told she was pregnant. Patient left the room before this provider seen patient again.  -Placed a referral to GYN for positive pregnancy test. Also, placed information on her after visit summary.    Zahriah Roes, NP

## 2024-01-07 NOTE — Telephone Encounter (Signed)
 Pt has been seen today in [place] field office placed referral there today.

## 2024-01-07 NOTE — Progress Notes (Signed)
 Saw pt for pregnancy concern. Pt did a urine pregnancy test pt refused to see dr after urine test and left.

## 2024-01-08 ENCOUNTER — Telehealth: Payer: Self-pay

## 2024-01-08 NOTE — Telephone Encounter (Signed)
 Spoke with pt about appt she is going to call the OBGYN that was provided for her from [place] yesterday I gave her the number for appt there.If any other concerns or questions she will call our office back.  Copied from CRM [id number]. Topic: General - Other >> Jan 08, 2024 12:26 PM Natasha Pope wrote: Reason for CRM: patient is needing to be seen sooner then July 7th   she is pregnant now she needs to be seen asap she is on a lot of different medication and needs to see what she needs to do far as her medication she would like a callback today

## 2024-01-27 ENCOUNTER — Encounter: Payer: Self-pay | Admitting: Internal Medicine

## 2024-01-27 ENCOUNTER — Ambulatory Visit: Admitting: Internal Medicine

## 2024-01-27 VITALS — BP 110/72 | HR 77 | Temp 98.0°F | Ht 65.0 in | Wt 186.2 lb

## 2024-01-27 DIAGNOSIS — O0991 Supervision of high risk pregnancy, unspecified, first trimester: Secondary | ICD-10-CM

## 2024-01-27 DIAGNOSIS — F1094 Alcohol use, unspecified with alcohol-induced mood disorder: Secondary | ICD-10-CM | POA: Diagnosis not present

## 2024-01-27 DIAGNOSIS — Z3A00 Weeks of gestation of pregnancy not specified: Secondary | ICD-10-CM

## 2024-01-27 DIAGNOSIS — F331 Major depressive disorder, recurrent, moderate: Secondary | ICD-10-CM | POA: Diagnosis not present

## 2024-01-27 DIAGNOSIS — F17290 Nicotine dependence, other tobacco product, uncomplicated: Secondary | ICD-10-CM

## 2024-01-27 DIAGNOSIS — F1021 Alcohol dependence, in remission: Secondary | ICD-10-CM

## 2024-01-27 DIAGNOSIS — F12980 Cannabis use, unspecified with anxiety disorder: Secondary | ICD-10-CM | POA: Diagnosis not present

## 2024-01-27 LAB — COMPREHENSIVE METABOLIC PANEL WITH GFR
ALT: 9 U/L (ref 0–35)
AST: 13 U/L (ref 0–37)
Albumin: 3.6 g/dL (ref 3.5–5.2)
Alkaline Phosphatase: 70 U/L (ref 39–117)
BUN: 3 mg/dL — ABNORMAL LOW (ref 6–23)
CO2: 26 meq/L (ref 19–32)
Calcium: 9 mg/dL (ref 8.4–10.5)
Chloride: 103 meq/L (ref 96–112)
Creatinine, Ser: 0.6 mg/dL (ref 0.40–1.20)
GFR: 123.23 mL/min (ref >60.00)
Glucose, Bld: 81 mg/dL (ref 70–99)
Potassium: 3 meq/L — ABNORMAL LOW (ref 3.5–5.1)
Sodium: 138 meq/L (ref 135–145)
Total Bilirubin: 0.4 mg/dL (ref 0.2–1.2)
Total Protein: 6.5 g/dL (ref 6.0–8.3)

## 2024-01-27 LAB — B12 AND FOLATE PANEL
Folate: 2.6 ng/mL — ABNORMAL LOW (ref >5.9)
Vitamin B-12: 146 pg/mL — ABNORMAL LOW (ref 211–911)

## 2024-01-27 LAB — VITAMIN D 25 HYDROXY (VIT D DEFICIENCY, FRACTURES): VITD: 9.66 ng/mL — ABNORMAL LOW (ref 30.00–100.00)

## 2024-01-27 LAB — FOLATE: Folate: 2.6 ng/mL — ABNORMAL LOW (ref >5.9)

## 2024-01-27 LAB — MAGNESIUM: Magnesium: 1.7 mg/dL (ref 1.5–2.5)

## 2024-01-27 LAB — HCG, QUANTITATIVE, PREGNANCY: Quantitative HCG: 77949 m[IU]/mL

## 2024-01-27 MED ORDER — ACAMPROSATE CALCIUM 333 MG PO TBEC: 666.0000 mg | DELAYED_RELEASE_TABLET | Freq: Three times a day (TID) | ORAL | 3 refills | Status: DC

## 2024-01-27 NOTE — Assessment & Plan Note (Signed)
 Anxiety/Depression - Active medication management. Patient remains on Lexapro , Auvelity , and Seroquel ; current regimen is maintained but not at optimal safety parameters given pregnancy. Plan to continue Lexapro  with ongoing monitoring for efficacy and adverse effects while considering a future switch to sertraline; Seroquel  taper is planned if stability persists. Follow up in 4 weeks to evaluate mental health and medication tolerability.

## 2024-01-27 NOTE — Assessment & Plan Note (Signed)
 Advised patient it is not recommended but medically far less problematic for baby than alcohol.

## 2024-01-27 NOTE — Patient Instructions (Signed)
 VISIT SUMMARY:  You came in today with concerns about your pregnancy and recent alcohol use. We discussed your history of alcohol use disorder, your current medications, and your lifestyle habits. We also talked about the potential impact of alcohol on your pregnancy and the importance of regular prenatal care.  YOUR PLAN:  -PREGNANCY: You are in the first trimester of your pregnancy and have a history of alcohol use disorder. We discussed the risks of alcohol exposure during early pregnancy, including the potential for fetal alcohol syndrome. It is crucial to continue taking prenatal vitamins, follow up with a high-risk obstetrician, and attend regular prenatal appointments to monitor your baby's health.  -ALCOHOL USE DISORDER: You have a history of alcohol use disorder and recently relapsed after discovering your pregnancy. Alcohol is very harmful to fetal development. We discussed prescribing acamprosate  to help manage alcohol cravings, which is safer than alcohol during pregnancy. It is important to avoid alcohol to ensure a healthy pregnancy.  -ANXIETY AND DEPRESSION: You are currently taking Lexapro , Auvelity , and Seroquel  for anxiety and depression. We discussed the risks and benefits of continuing these medications during pregnancy. The benefits of continuing these medications outweigh the risks, especially to prevent alcohol relapse. We plan to continue your current medications, with a slow taper of Seroquel  in the future if you remain stable. We may consider switching Lexapro  to sertraline, which is safer during pregnancy.  -NICOTINE  USE: You use nicotine  through vaping. We discussed the risks of lead exposure from vaping and suggested alternative nicotine  replacement options. While nicotine  is a concern, alcohol poses a greater risk to fetal health. We recommend trying Zyn nicotine  pouches as a safer alternative to vaping and discussed other options like nicotine  patches, gums, or  lozenges.  -GENERAL HEALTH MAINTENANCE: You are managing allergies, GERD, and occasional headaches. Your current medications for these conditions are considered safe during pregnancy. It is important to continue these medications to maintain your overall health during pregnancy. Continue taking Xyzal  for allergies, Prilosec for GERD, Zofran  as needed for nausea, and Maxalt  as needed for headaches.  INSTRUCTIONS:  Regular follow-up is crucial due to the high-risk nature of your pregnancy and alcohol use disorder. Please schedule a follow-up appointment in a few weeks to monitor your stability and medication management. Ensure you maintain regular obstetric appointments to reduce risks associated with your high-risk pregnancy.

## 2024-01-27 NOTE — Assessment & Plan Note (Signed)
 Alcohol Use Disorder - Not at goal. History of severe alcohol use disorder in early remission with recent relapse following pregnancy confirmation. Active medication management in place with initiation of acamprosate  333?mg tablets (2 tablets TID, total 666?mg/day) and documented counseling regarding abstinence to reduce fetal risk. Reassess adherence and potential relapse in 2-4 weeks. Per patient report, Currently in remission, she is committed to avoiding alcohol to ensure a healthy pregnancy. Alcohol poses a significant risk to fetal development, being the most dangerous substance for the fetus. Acamprosate  was discussed as a protective factor against alcohol use, safer than alcohol during pregnancy. Prescribe acamprosate  and encourage having it available in case of alcohol cravings.

## 2024-01-27 NOTE — Assessment & Plan Note (Signed)
 In my medical opinion, it is unsafe to stop Lexapro /seroquel  today, as she is struggling to maintain sobriety from alcohol in the wake of anxiety provoking pregnancy. I.e benefits outweigh risks.   The single most important teratogenic substance to avoid, by far, is alcohol and this was emphasized The risks and benefits of continuing these medications during pregnancy were discussed. Lexapro  and Auvelity  are category C, and Seroquel  is associated with gestational diabetes and neonatal withdrawal. The benefits of continuing medications outweigh the risks, especially to prevent alcohol relapse. A slow taper of Seroquel  is planned if stability is maintained. Consider switching Lexapro  to sertraline, which is safer during pregnancy. Continue Lexapro , Auvelity , and Seroquel , plan a slow taper of Seroquel  in the future if stable, and consider switching Lexapro  to sertraline if needed.

## 2024-01-27 NOTE — Progress Notes (Signed)
 ==============================  [facility]: [phone]   -- Medical Office Visit --  Patient: Natasha Pope       Age: 28 y.o.       Sex:  female  Date:   01/27/2024 Today's Healthcare Provider: Bernardino KANDICE Cone, MD  ==============================   Chief Complaint: Discuss Positive pregnancy test (Last period was in may spotting has took 3 test at home one negative and two positive and has went to dr has positive test. Pt states that her last full period she believes it was feb of this year.Would like to have some blood work today to know for sure she is pregnant )  Discussed the use of AI scribe software for clinical note transcription with the patient, who gave verbal consent to proceed.  History of Present Illness The patient is a 28 year old female with a significant psychiatric and substance use history, presenting to discuss a newly confirmed [high-risk] pregnancy and associated concerns.  In particular she doesn't have firm dates because she had spotty period in may 2025, also was advised to stop Lexapro  and seroquel  but concerned this would precipitate alcohol relapse. She reports her last full menstrual period was in February 2025, with subsequent irregular spotting in May. She became concerned about possible pregnancy after noticing symptoms and took three home pregnancy tests: one was negative, followed by two positives. Confirmation of pregnancy was obtained via a provider-administered urine test in June and a quantitative serum hCG on today's visit (77,949 mIU/mL), supporting first trimester pregnancy. She has a history of severe alcohol use disorder, currently in early remission. She reports abstaining from alcohol prior to discovering her pregnancy, but relapsed with alcohol use after her positive test in May. She is unable to specify the exact amount or frequency of alcohol consumed, but expresses significant concern about potential  harm to the fetus, particularly regarding fetal alcohol syndrome. She is now committed to maintaining sobriety for the remainder of the pregnancy. She is currently taking Lexapro , Seroquel  (quetiapine , both scheduled and PRN), Auvelity , propranolol , and other medications for anxiety, depression, and comorbid conditions. She is concerned about the safety of these medications in pregnancy and is interested in optimizing her regimen to minimize fetal risks. She was previously taking prenatal vitamins but stopped and has been using a multivitamin provided by her grandmother. She is seeking guidance on appropriate supplementation, especially given her history of alcohol use and recent laboratory findings of low folate, low B12, and low vitamin D . She continues to use nicotine  via vaping and smokes marijuana; she inquires about the safety of these substances in pregnancy and is interested in harm reduction strategies. She denies current alcohol use and is motivated to avoid further exposures. She reports increased cravings for seafood and sugary foods, and frequently eats cereal. She also has a history of asthma, obesity (BMI ~31), PTSD, panic disorder with agoraphobia, and chronic abdominal pain. She is awaiting her first formal obstetric appointment and requests additional bloodwork today to confirm her pregnancy and assess her overall health status. Additional context: No acute complaints at this visit; denies vaginal bleeding, severe abdominal pain, fevers, or new psychiatric symptoms. Reports good adherence to her prescribed psychiatric medications. No current suicidal or homicidal ideation. She is well-groomed, in no acute distress, and engaged in her care.  Summary of Key Issues: Newly confirmed first trimester pregnancy. Recent relapse of alcohol use disorder after pregnancy discovery; now in remission and committed to sobriety. Concern for teratogenic effects of alcohol  and ongoing psychiatric  medications. Ongoing nicotine  and marijuana use; seeking advice on safety and cessation. History of multiple psychiatric and medical comorbidities. Laboratory evidence of multiple vitamin deficiencies (folate, B12, vitamin D ). Requests guidance on prenatal vitamins and nutritional support.   Updated Problem List Entries: No problems updated.  Background Reviewed: Problem List: has Nicotine  dependence due to vaping tobacco product; Asthma; GAD (generalized anxiety disorder); ASCUS of cervix with negative high risk HPV; Macrocytosis; Substance induced mood disorder (HCC); Alcohol use with alcohol-induced mood disorder (HCC); Alcohol use disorder, severe, in early remission, dependence (HCC); Recurrent major depressive disorder (HCC); Panic disorder with agoraphobia; Delusional disorder (HCC); Auditory hallucinations; Visual hallucination; Chronic abdominal pain; PTSD (post-traumatic stress disorder); Memory loss; Snoring; Obesity (BMI 30-39.9); Financial difficulties; Gastroenteritis; Contraceptive management; GERD (gastroesophageal reflux disease); Migraine; Nausea; History of alcohol use disorder; Cannabis use with anxiety disorder (HCC); Parasomnia; and Pruritic dermatitis on their problem list. Past Medical History:  has a past medical history of Anxiety, Asthma, Depression, Gastroenteritis (10/22/2022), History of alcohol withdrawal delirium (07/04/2022), History of seizure due to alcohol withdrawal (02/28/2021), Memory loss (04/03/2022), Nausea vomiting and diarrhea (06/27/2022), Nicotine  dependence due to vaping tobacco product (01/31/2018), 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 (04/17/2022), PTSD (post-traumatic stress disorder) (03/19/2022), Pyelonephritis, Seizure-like activity (HCC), Seizures (HCC), Sepsis (HCC), Snoring (04/17/2022), Thrombocytopenia (HCC) (05/19/2018), Unwanted pregnancy (11/26/2022), and Withdrawal syndrome (HCC) (09/27/2022). Past Surgical History:   has a past surgical history  that includes dental procedure. Social History:   reports that she has been smoking cigarettes and e-cigarettes. She has never used smokeless tobacco. She reports that she does not currently use alcohol. She reports that she does not currently use drugs after having used the following drugs: Marijuana. Family History:  family history includes Alcohol abuse in her father, maternal grandfather, and paternal grandmother; Depression in her mother; Hyperlipidemia in her maternal grandmother; Hypertension in her father and paternal grandfather; Learning disabilities in her sister; Miscarriages / Stillbirths in her mother; Thyroid  disease in her mother. Allergies:  is allergic to lamotrigine  and pineapple.   Medication Reconciliation: Current Outpatient Medications on File Prior to Visit  Medication Sig   busPIRone  (BUSPAR ) 15 MG tablet TAKE ONE TABLET BY MOUTH THREE TIMES DAILY (MUST BE TAKEN CONSISTENTLY)   Dextromethorphan -buPROPion  ER (AUVELITY ) 45-105 MG TBCR Take 1 tablet by mouth 2 (two) times daily. To start, take just one tablet once daily in the morning. After 3 days, increase to the maximum recommended dosage of one tablet twice daily, given at least 8 hours apart. Do not exceed two doses within the same day.   escitalopram  (LEXAPRO ) 10 MG tablet Take 1 tablet (10 mg total) by mouth daily. Replaces 20 mg dosing.   levocetirizine (XYZAL ) 5 MG tablet Take 1 tablet (5 mg total) by mouth every evening.   omeprazole  (PRILOSEC) 20 MG capsule Take 1 capsule (20 mg total) by mouth daily.   ondansetron  (ZOFRAN ) 4 MG tablet Take 1 tablet (4 mg total) by mouth every 8 (eight) hours as needed for nausea or vomiting.   ondansetron  (ZOFRAN -ODT) 4 MG disintegrating tablet Take 1 tablet (4 mg total) by mouth every 8 (eight) hours as needed for nausea or vomiting.   propranolol  (INDERAL ) 20 MG tablet Take 1 tablet (20 mg total) by mouth 3 (three) times daily.   propranolol  ER (INDERAL  LA) 60 MG 24 hr capsule Take  1 capsule (60 mg total) by mouth daily.   QUEtiapine  (SEROQUEL ) 100 MG tablet TAKE ONE-HALF TABLET (50MG ) BY MOUTH IN THE MORNING  AND ONE AND ONE-HALF TABLET (150MG ) IN THE EVENING *STOP DULOXETINE  AND TRAZODONE * (OK TO TAKE WITH ELEVATE)   QUEtiapine  (SEROQUEL ) 100 MG tablet Take 1 tablet (100 mg total) by mouth 2 (two) times daily. Take as 50 mg in morning, 150 mg in evening.  Stop duloxetine  and trazodone . Ok to take with elevate.   rizatriptan  (MAXALT ) 5 MG tablet Take 5 mg by mouth as needed for migraine. May repeat in 2 hours if needed   thiamine  (VITAMIN B-1) 100 MG tablet Take 1 tablet (100 mg total) by mouth daily.   norethindrone-ethinyl estradiol-FE (HAILEY FE 1/20) 1-20 MG-MCG tablet Take 1 tablet by mouth daily. (Patient not taking: Reported on 01/27/2024)   [DISCONTINUED] Dextromethorphan  HBr 15 MG TABS Take 3 tablets by mouth daily at 6 (six) AM.   No current facility-administered medications on file prior to visit.  There are no discontinued medications.   Physical Exam:    01/27/2024   10:35 AM 01/07/2024   10:38 AM 12/25/2023   11:15 AM  Vitals with BMI  Height 5' 5 5' 5 5' 5  Weight 186 lbs 3 oz 181 lbs 184 lbs 6 oz  BMI 30.99 30.12 30.69  Systolic 110 116 891  Diastolic 72 78 74  Pulse 77 115 86  Vital signs reviewed.  Nursing notes reviewed. Weight trend reviewed. Physical Exam General Appearance:  No acute distress appreciable.   Well-groomed, healthy-appearing female.  Well proportioned with no abnormal fat distribution.  Good muscle tone. Pulmonary:  Normal work of breathing at rest, no respiratory distress apparent. SpO2: 98 %  Musculoskeletal: All extremities are intact.  Neurological:  Awake, alert, oriented, and engaged.  No obvious focal neurological deficits or cognitive impairments.  Sensorium seems unclouded.   Speech is clear and coherent with logical content. Psychiatric:  Appropriate mood, pleasant and cooperative demeanor, thoughtful and engaged during  the exam  Results:    12/25/2023   11:20 AM 09/17/2023    7:38 AM 07/22/2023    1:14 PM 03/20/2023    4:08 PM  PHQ 2/9 Scores  PHQ - 2 Score 0 0 1 2  PHQ- 9 Score 3 0 3 7   Results      Results for orders placed or performed in visit on 01/27/24  hCG, quantitative, pregnancy  Result Value Ref Range   Quantitative HCG 77,949.00 mIU/ml  Folate  Result Value Ref Range   Folate 2.6 (L) >5.9 ng/mL  B12 and Folate Panel  Result Value Ref Range   Vitamin B-12 146 (L) 211 - 911 pg/mL   Folate 2.6 (L) >5.9 ng/mL  Vitamin D  (25 hydroxy)  Result Value Ref Range   VITD 9.66 (L) 30.00 - 100.00 ng/mL  Magnesium   Result Value Ref Range   Magnesium  1.7 1.5 - 2.5 mg/dL  Comp Met (CMET)  Result Value Ref Range   Sodium 138 135 - 145 mEq/L   Potassium 3.0 (L) 3.5 - 5.1 mEq/L   Chloride 103 96 - 112 mEq/L   CO2 26 19 - 32 mEq/L   Glucose, Bld 81 70 - 99 mg/dL   BUN 3 (L) 6 - 23 mg/dL   Creatinine, Ser 9.39 0.40 - 1.20 mg/dL   Total Bilirubin 0.4 0.2 - 1.2 mg/dL   Alkaline Phosphatase 70 39 - 117 U/L   AST 13 0 - 37 U/L   ALT 9 0 - 35 U/L   Total Protein 6.5 6.0 - 8.3 g/dL   Albumin 3.6 3.5 - 5.2  g/dL   GFR 876.76 >39.99 mL/min   Calcium  9.0 8.4 - 10.5 mg/dL   Office Visit on 92/92/7974  Component Date Value Ref Range Status   Quantitative HCG 01/27/2024 77,949.00  mIU/ml Final   Folate 01/27/2024 2.6 (L)  >5.9 ng/mL Final   Vitamin B-12 01/27/2024 146 (L)  211 - 911 pg/mL Final   Folate 01/27/2024 2.6 (L)  >5.9 ng/mL Final   VITD 01/27/2024 9.66 (L)  30.00 - 100.00 ng/mL Final   Magnesium  01/27/2024 1.7  1.5 - 2.5 mg/dL Final   Sodium 92/92/7974 138  135 - 145 mEq/L Final   Potassium 01/27/2024 3.0 (L)  3.5 - 5.1 mEq/L Final   Chloride 01/27/2024 103  96 - 112 mEq/L Final   CO2 01/27/2024 26  19 - 32 mEq/L Final   Glucose, Bld 01/27/2024 81  70 - 99 mg/dL Final   BUN 92/92/7974 3 (L)  6 - 23 mg/dL Final   Creatinine, Ser 01/27/2024 0.60  0.40 - 1.20 mg/dL Final   Total  Bilirubin 01/27/2024 0.4  0.2 - 1.2 mg/dL Final   Alkaline Phosphatase 01/27/2024 70  39 - 117 U/L Final   AST 01/27/2024 13  0 - 37 U/L Final   ALT 01/27/2024 9  0 - 35 U/L Final   Total Protein 01/27/2024 6.5  6.0 - 8.3 g/dL Final   Albumin 92/92/7974 3.6  3.5 - 5.2 g/dL Final   GFR 92/92/7974 123.23  >60.00 mL/min Final   Calcium  01/27/2024 9.0  8.4 - 10.5 mg/dL Final  Office Visit on 01/07/2024  Component Date Value Ref Range Status   Preg Test, Ur 01/07/2024 Positive (A)  Negative Final  Office Visit on 09/27/2023  Component Date Value Ref Range Status   SARS Coronavirus 2 Ag 09/27/2023 Negative  Negative Final   Rapid Strep A Screen 09/27/2023 Negative  Negative Final  Office Visit on 09/17/2023  Component Date Value Ref Range Status   Preg Test, Ur 09/17/2023 Negative  Negative Final   Color, UA 09/17/2023 yellow   Final   Clarity, UA 09/17/2023 cloudy   Final   Glucose, UA 09/17/2023 Negative  Negative Final   Bilirubin, UA 09/17/2023 positive   Final   Ketones, UA 09/17/2023 positive   Final   Spec Grav, UA 09/17/2023 >=1.030 (A)  1.010 - 1.025 Final   Blood, UA 09/17/2023 negative   Final   pH, UA 09/17/2023 6.0  5.0 - 8.0 Final   Protein, UA 09/17/2023 Positive (A)  Negative Final   Urobilinogen, UA 09/17/2023 negative (A)  0.2 or 1.0 E.U./dL Final   Nitrite, UA 97/74/7974 negative   Final   Leukocytes, UA 09/17/2023 Trace (A)  Negative Final   MICRO NUMBER: 09/17/2023 [id number]   Final   SPECIMEN QUALITY: 09/17/2023 Adequate   Final   Sample Source 09/17/2023 URINE   Final   STATUS: 09/17/2023 FINAL   Final   Result: 09/17/2023    Final                   Value:Mixed genital flora isolated. These superficial bacteria are not indicative of a urinary tract infection. No further organism identification is warranted on this specimen. If clinically indicated, recollect clean-catch, mid-stream urine and transfer  immediately to Urine Culture Transport Tube.   Office Visit on  06/17/2023  Component Date Value Ref Range Status   Rapid Strep A Screen 06/17/2023 Negative  Negative Final   Influenza A, POC 06/17/2023 Negative  Negative Final  Influenza B, POC 06/17/2023 Negative  Negative Final   SARS Coronavirus 2 Ag 06/17/2023 Negative  Negative Final  Office Visit on 02/01/2023  Component Date Value Ref Range Status   Rapid Strep A Screen 02/01/2023 Negative  Negative Final   SARS Coronavirus 2 Ag 02/01/2023 Negative  Negative Final   Glucose, Bld 02/01/2023 99  65 - 99 mg/dL Final   BUN 92/87/7975 8  7 - 25 mg/dL Final   Creat 92/87/7975 0.99 (H)  0.50 - 0.96 mg/dL Final   BUN/Creatinine Ratio 02/01/2023 8  6 - 22 (calc) Final   Sodium 02/01/2023 134 (L)  135 - 146 mmol/L Final   Potassium 02/01/2023 3.0 (L)  3.5 - 5.3 mmol/L Final   Chloride 02/01/2023 90 (L)  98 - 110 mmol/L Final   CO2 02/01/2023 24  20 - 32 mmol/L Final   Calcium  02/01/2023 9.6  8.6 - 10.2 mg/dL Final   Total Protein 92/87/7975 8.2 (H)  6.1 - 8.1 g/dL Final   Albumin 92/87/7975 5.0  3.6 - 5.1 g/dL Final   Globulin 92/87/7975 3.2  1.9 - 3.7 g/dL (calc) Final   AG Ratio 02/01/2023 1.6  1.0 - 2.5 (calc) Final   Total Bilirubin 02/01/2023 0.9  0.2 - 1.2 mg/dL Final   Alkaline phosphatase (APISO) 02/01/2023 149 (H)  31 - 125 U/L Final   AST 02/01/2023 107 (H)  10 - 30 U/L Final   ALT 02/01/2023 52 (H)  6 - 29 U/L Final   Hgb A1c MFr Bld 02/01/2023 5.1  <5.7 % of total Hgb Final   Mean Plasma Glucose 02/01/2023 100  mg/dL Final   eAG (mmol/L) 92/87/7975 5.5  mmol/L Final  Office Visit on 08/29/2022  Component Date Value Ref Range Status   High risk HPV 08/29/2022 Negative   Final   Neisseria Gonorrhea 08/29/2022 Negative   Final   Chlamydia 08/29/2022 Negative   Final   Trichomonas 08/29/2022 Negative   Final   Adequacy 08/29/2022 Satisfactory for evaluation; transformation zone component PRESENT.   Final   Diagnosis 08/29/2022 - Negative for intraepithelial lesion or malignancy (NILM)    Final   Microorganisms 08/29/2022 Shift in flora suggestive of bacterial vaginosis   Final   Comment 08/29/2022 Normal Reference Range HPV - Negative   Final   Comment 08/29/2022 Normal Reference Range Trichomonas - Negative   Final   Comment 08/29/2022 Normal Reference Ranger Chlamydia - Negative   Final   Comment 08/29/2022 Normal Reference Range Neisseria Gonorrhea - Negative   Final   RPR Ser Ql 08/29/2022 NON-REACTIVE  NON-REACTIVE Final   HIV 1&2 Ab, 4th Generation 08/29/2022 NON-REACTIVE  NON-REACTIVE Final   Hepatitis C Ab 08/29/2022 NON-REACTIVE  NON-REACTIVE Final  Office Visit on 08/03/2022  Component Date Value Ref Range Status   SARS Coronavirus 2 Ag 08/03/2022 Negative  Negative Final   Influenza A, POC 08/03/2022 Negative  Negative Final   Influenza B, POC 08/03/2022 Negative  Negative Final   Rapid Strep A Screen 08/03/2022 Negative  Negative Final   Color, Urine 08/03/2022 ORANGE (A)  Yellow;Lt. Yellow;Straw;Dark Yellow;Amber;Green;Red;Brown Final   APPearance 08/03/2022 SL CLOUDY (A)  Clear;Turbid;Slightly Cloudy;Cloudy Final   Specific Gravity, Urine 08/03/2022 1.015  1.000 - 1.030 Final   pH 08/03/2022 8.0  5.0 - 8.0 Final   Total Protein, Urine 08/03/2022 30 (A)  Negative Final   Urine Glucose 08/03/2022 NEGATIVE  Negative Final   Ketones, ur 08/03/2022 15 (A)  Negative Final   Bilirubin Urine 08/03/2022  SMALL (A)  Negative Final   Hgb urine dipstick 08/03/2022 NEGATIVE  Negative Final   Urobilinogen, UA 08/03/2022 2.0 (A)  0.0 - 1.0 Final   Leukocytes,Ua 08/03/2022 NEGATIVE  Negative Final   Nitrite 08/03/2022 NEGATIVE  Negative Final   WBC, UA 08/03/2022 0-2/hpf  0-2/hpf Final   RBC / HPF 08/03/2022 0-2/hpf  0-2/hpf Final   Squamous Epithelial / HPF 08/03/2022 Many(>10/hpf) (A)  Rare(0-4/hpf) Final   Bacteria, UA 08/03/2022 Few(10-50/hpf) (A)  None Final   Epithelial Casts, UA 08/03/2022 Presence of (A)  None Final   Lipase 08/03/2022 6 (L)  7 - 60 U/L Final    Glucose, Bld 08/03/2022 114 (H)  65 - 99 mg/dL Final   BUN 98/87/7975 4 (L)  7 - 25 mg/dL Final   Creat 98/87/7975 0.74  0.50 - 0.96 mg/dL Final   BUN/Creatinine Ratio 08/03/2022 5 (L)  6 - 22 (calc) Final   Sodium 08/03/2022 135  135 - 146 mmol/L Final   Potassium 08/03/2022 3.4 (L)  3.5 - 5.3 mmol/L Final   Chloride 08/03/2022 97 (L)  98 - 110 mmol/L Final   CO2 08/03/2022 18 (L)  20 - 32 mmol/L Final   Calcium  08/03/2022 9.1  8.6 - 10.2 mg/dL Final   Total Protein 98/87/7975 7.9  6.1 - 8.1 g/dL Final   Albumin 98/87/7975 4.5  3.6 - 5.1 g/dL Final   Globulin 98/87/7975 3.4  1.9 - 3.7 g/dL (calc) Final   AG Ratio 08/03/2022 1.3  1.0 - 2.5 (calc) Final   Total Bilirubin 08/03/2022 0.7  0.2 - 1.2 mg/dL Final   Alkaline phosphatase (APISO) 08/03/2022 69  31 - 125 U/L Final   AST 08/03/2022 21  10 - 30 U/L Final   ALT 08/03/2022 18  6 - 29 U/L Final   WBC 08/03/2022 12.4 (H)  3.8 - 10.8 Thousand/uL Final   RBC 08/03/2022 4.09  3.80 - 5.10 Million/uL Final   Hemoglobin 08/03/2022 13.7  11.7 - 15.5 g/dL Final   HCT 98/87/7975 39.2  35.0 - 45.0 % Final   MCV 08/03/2022 95.8  80.0 - 100.0 fL Final   MCH 08/03/2022 33.5 (H)  27.0 - 33.0 pg Final   MCHC 08/03/2022 34.9  32.0 - 36.0 g/dL Final   RDW 98/87/7975 15.6 (H)  11.0 - 15.0 % Final   Platelets 08/03/2022 285  140 - 400 Thousand/uL Final   MPV 08/03/2022 10.8  7.5 - 12.5 fL Final   Amylase 08/03/2022 66  21 - 101 U/L Final  Admission on 06/26/2022, Discharged on 06/29/2022  Component Date Value Ref Range Status   WBC 06/26/2022 7.0  4.0 - 10.5 K/uL Final   RBC 06/26/2022 3.53 (L)  3.87 - 5.11 MIL/uL Final   Hemoglobin 06/26/2022 12.8  12.0 - 15.0 g/dL Final   HCT 87/94/7976 36.6  36.0 - 46.0 % Final   MCV 06/26/2022 103.7 (H)  80.0 - 100.0 fL Final   MCH 06/26/2022 36.3 (H)  26.0 - 34.0 pg Final   MCHC 06/26/2022 35.0  30.0 - 36.0 g/dL Final   RDW 87/94/7976 17.6 (H)  11.5 - 15.5 % Final   Platelets 06/26/2022 203  150 - 400 K/uL  Final   nRBC 06/26/2022 0.6 (H)  0.0 - 0.2 % Final   Sodium 06/26/2022 136  135 - 145 mmol/L Final   Potassium 06/26/2022 2.9 (L)  3.5 - 5.1 mmol/L Final   Chloride 06/26/2022 94 (L)  98 - 111 mmol/L Final  CO2 06/26/2022 24  22 - 32 mmol/L Final   Glucose, Bld 06/26/2022 164 (H)  70 - 99 mg/dL Final   BUN 87/94/7976 6  6 - 20 mg/dL Final   Creatinine, Ser 06/26/2022 0.77  0.44 - 1.00 mg/dL Final   Calcium  06/26/2022 9.0  8.9 - 10.3 mg/dL Final   Total Protein 87/94/7976 7.3  6.5 - 8.1 g/dL Final   Albumin 87/94/7976 4.3  3.5 - 5.0 g/dL Final   AST 87/94/7976 65 (H)  15 - 41 U/L Final   ALT 06/26/2022 30  0 - 44 U/L Final   Alkaline Phosphatase 06/26/2022 86  38 - 126 U/L Final   Total Bilirubin 06/26/2022 1.5 (H)  0.3 - 1.2 mg/dL Final   GFR, Estimated 06/26/2022 >60  >60 mL/min Final   Anion gap 06/26/2022 18 (H)  5 - 15 Final   Color, Urine 06/27/2022 AMBER (A)  YELLOW Final   APPearance 06/27/2022 HAZY (A)  CLEAR Final   Specific Gravity, Urine 06/27/2022 1.024  1.005 - 1.030 Final   pH 06/27/2022 7.0  5.0 - 8.0 Final   Glucose, UA 06/27/2022 NEGATIVE  NEGATIVE mg/dL Final   Hgb urine dipstick 06/27/2022 NEGATIVE  NEGATIVE Final   Bilirubin Urine 06/27/2022 SMALL (A)  NEGATIVE Final   Ketones, ur 06/27/2022 20 (A)  NEGATIVE mg/dL Final   Protein, ur 87/93/7976 100 (A)  NEGATIVE mg/dL Final   Nitrite 87/93/7976 NEGATIVE  NEGATIVE Final   Leukocytes,Ua 06/27/2022 NEGATIVE  NEGATIVE Final   RBC / HPF 06/27/2022 0-5  0 - 5 RBC/hpf Final   WBC, UA 06/27/2022 0-5  0 - 5 WBC/hpf Final   Bacteria, UA 06/27/2022 NONE SEEN  NONE SEEN Final   Squamous Epithelial / HPF 06/27/2022 6-10  0 - 5 Final   Mucus 06/27/2022 PRESENT   Final   Hyaline Casts, UA 06/27/2022 PRESENT   Final   Preg Test, Ur 06/27/2022 NEGATIVE  NEGATIVE Final   Alcohol, Ethyl (B) 06/26/2022 <10  <10 mg/dL Final   Lactic Acid, Venous 06/26/2022 1.5  0.5 - 1.9 mmol/L Final   Opiates 06/27/2022 NONE DETECTED  NONE  DETECTED Final   Cocaine 06/27/2022 NONE DETECTED  NONE DETECTED Final   Benzodiazepines 06/27/2022 POSITIVE (A)  NONE DETECTED Final   Amphetamines 06/27/2022 NONE DETECTED  NONE DETECTED Final   Tetrahydrocannabinol 06/27/2022 POSITIVE (A)  NONE DETECTED Final   Barbiturates 06/27/2022 NONE DETECTED  NONE DETECTED Final   HIV Screen 4th Generation wRfx 06/27/2022 Non Reactive  Non Reactive Final   Magnesium  06/27/2022 1.5 (L)  1.7 - 2.4 mg/dL Final   Phosphorus 87/93/7976 3.2  2.5 - 4.6 mg/dL Final   WBC 87/93/7976 7.2  4.0 - 10.5 K/uL Final   RBC 06/27/2022 3.27 (L)  3.87 - 5.11 MIL/uL Final   Hemoglobin 06/27/2022 11.8 (L)  12.0 - 15.0 g/dL Final   HCT 87/93/7976 34.0 (L)  36.0 - 46.0 % Final   MCV 06/27/2022 104.0 (H)  80.0 - 100.0 fL Final   MCH 06/27/2022 36.1 (H)  26.0 - 34.0 pg Final   MCHC 06/27/2022 34.7  30.0 - 36.0 g/dL Final   RDW 87/93/7976 17.8 (H)  11.5 - 15.5 % Final   Platelets 06/27/2022 186  150 - 400 K/uL Final   nRBC 06/27/2022 0.8 (H)  0.0 - 0.2 % Final   Sodium 06/27/2022 135  135 - 145 mmol/L Final   Potassium 06/27/2022 3.3 (L)  3.5 - 5.1 mmol/L Final   Chloride  06/27/2022 94 (L)  98 - 111 mmol/L Final   CO2 06/27/2022 26  22 - 32 mmol/L Final   Glucose, Bld 06/27/2022 118 (H)  70 - 99 mg/dL Final   BUN 87/93/7976 6  6 - 20 mg/dL Final   Creatinine, Ser 06/27/2022 0.77  0.44 - 1.00 mg/dL Final   Calcium  06/27/2022 8.7 (L)  8.9 - 10.3 mg/dL Final   Total Protein 87/93/7976 6.7  6.5 - 8.1 g/dL Final   Albumin 87/93/7976 4.2  3.5 - 5.0 g/dL Final   AST 87/93/7976 46 (H)  15 - 41 U/L Final   ALT 06/27/2022 25  0 - 44 U/L Final   Alkaline Phosphatase 06/27/2022 76  38 - 126 U/L Final   Total Bilirubin 06/27/2022 1.3 (H)  0.3 - 1.2 mg/dL Final   GFR, Estimated 06/27/2022 >60  >60 mL/min Final   Anion gap 06/27/2022 15  5 - 15 Final   Sodium 06/28/2022 137  135 - 145 mmol/L Final   Potassium 06/28/2022 2.5 (LL)  3.5 - 5.1 mmol/L Final   Chloride 06/28/2022 99   98 - 111 mmol/L Final   CO2 06/28/2022 26  22 - 32 mmol/L Final   Glucose, Bld 06/28/2022 93  70 - 99 mg/dL Final   BUN 87/92/7976 <5 (L)  6 - 20 mg/dL Final   Creatinine, Ser 06/28/2022 0.71  0.44 - 1.00 mg/dL Final   Calcium  06/28/2022 9.2  8.9 - 10.3 mg/dL Final   Total Protein 87/92/7976 6.6  6.5 - 8.1 g/dL Final   Albumin 87/92/7976 4.0  3.5 - 5.0 g/dL Final   AST 87/92/7976 51 (H)  15 - 41 U/L Final   ALT 06/28/2022 26  0 - 44 U/L Final   Alkaline Phosphatase 06/28/2022 72  38 - 126 U/L Final   Total Bilirubin 06/28/2022 1.2  0.3 - 1.2 mg/dL Final   GFR, Estimated 06/28/2022 >60  >60 mL/min Final   Anion gap 06/28/2022 12  5 - 15 Final   WBC 06/28/2022 5.8  4.0 - 10.5 K/uL Final   RBC 06/28/2022 3.47 (L)  3.87 - 5.11 MIL/uL Final   Hemoglobin 06/28/2022 12.6  12.0 - 15.0 g/dL Final   HCT 87/92/7976 36.6  36.0 - 46.0 % Final   MCV 06/28/2022 105.5 (H)  80.0 - 100.0 fL Final   MCH 06/28/2022 36.3 (H)  26.0 - 34.0 pg Final   MCHC 06/28/2022 34.4  30.0 - 36.0 g/dL Final   RDW 87/92/7976 18.1 (H)  11.5 - 15.5 % Final   Platelets 06/28/2022 133 (L)  150 - 400 K/uL Final   nRBC 06/28/2022 0.7 (H)  0.0 - 0.2 % Final   Magnesium  06/28/2022 2.0  1.7 - 2.4 mg/dL Final   Sodium 87/92/7976 138  135 - 145 mmol/L Final   Potassium 06/28/2022 5.8 (H)  3.5 - 5.1 mmol/L Final   Chloride 06/28/2022 107  98 - 111 mmol/L Final   CO2 06/28/2022 19 (L)  22 - 32 mmol/L Final   Glucose, Bld 06/28/2022 99  70 - 99 mg/dL Final   BUN 87/92/7976 <5 (L)  6 - 20 mg/dL Final   Creatinine, Ser 06/28/2022 1.22 (H)  0.44 - 1.00 mg/dL Final   Calcium  06/28/2022 9.5  8.9 - 10.3 mg/dL Final   GFR, Estimated 06/28/2022 >60  >60 mL/min Final   Anion gap 06/28/2022 12  5 - 15 Final   Sodium 06/29/2022 139  135 - 145 mmol/L Final   Potassium  06/29/2022 3.6  3.5 - 5.1 mmol/L Final   Chloride 06/29/2022 105  98 - 111 mmol/L Final   CO2 06/29/2022 22  22 - 32 mmol/L Final   Glucose, Bld 06/29/2022 88  70 - 99 mg/dL  Final   BUN 87/91/7976 <5 (L)  6 - 20 mg/dL Final   Creatinine, Ser 06/29/2022 0.69  0.44 - 1.00 mg/dL Final   Calcium  06/29/2022 9.2  8.9 - 10.3 mg/dL Final   GFR, Estimated 06/29/2022 >60  >60 mL/min Final   Anion gap 06/29/2022 12  5 - 15 Final   Magnesium  06/29/2022 1.7  1.7 - 2.4 mg/dL Final   TSH 87/91/7976 5.813 (H)  0.350 - 4.500 uIU/mL Final  Office Visit on 06/01/2022  Component Date Value Ref Range Status   Preg Test, Ur 06/01/2022 Negative  Negative Final   SARS Coronavirus 2 Ag 06/01/2022 Negative  Negative Final   Influenza A, POC 06/01/2022 Negative  Negative Final   Influenza B, POC 06/01/2022 Negative  Negative Final  There may be more visits with results that are not included.  No image results found. No results found.       ASSESSMENT & PLAN   Assessment & Plan High-risk pregnancy in first trimester Pregnancy - Not at goal. Patient in early first trimester (LMP Feb 2025; quantitative HCG 77,949?mIU/mL) with recent alcohol relapse and documented nutritional deficiencies (folate 2.6?ng/mL, B12 146?pg/mL, vitamin D  9.66?ng/mL). Active risk management initiated with orders for a repeat quantitative HCG to confirm dating, continuation of appropriate prenatal vitamins, and referral to high-risk obstetrics. Monitor for fetal alcohol syndrome and complications with follow-up in 2 weeks.  High risk obstetrics is planned already.  She is in the first trimester of pregnancy with an alcohol use disorder. Concerns about potential alcohol exposure during early pregnancy were discussed, as she relapsed after discovering the pregnancy but was sober prior to conception. The risk of fetal alcohol syndrome was emphasized, particularly the critical period in the first few weeks of pregnancy. Alcohol-induced vitamin deficiencies before pregnancy drive the probability of significant defects. She is committed to maintaining the pregnancy and is under high-risk pregnancy management. Regular  follow-up and prenatal care are crucial to reduce risks. Order HCG quant to estimate the date of conception, continue prenatal vitamins, refer to a high-risk obstetrician, monitor for fetal alcohol syndrome, and encourage regular follow-up appointments. Alcohol use disorder, severe, in early remission, dependence (HCC) Alcohol Use Disorder - Not at goal. History of severe alcohol use disorder in early remission with recent relapse following pregnancy confirmation. Active medication management in place with initiation of acamprosate  333?mg tablets (2 tablets TID, total 666?mg/day) and documented counseling regarding abstinence to reduce fetal risk. Reassess adherence and potential relapse in 2-4 weeks. Per patient report, Currently in remission, she is committed to avoiding alcohol to ensure a healthy pregnancy. Alcohol poses a significant risk to fetal development, being the most dangerous substance for the fetus. Acamprosate  was discussed as a protective factor against alcohol use, safer than alcohol during pregnancy. Prescribe acamprosate  and encourage having it available in case of alcohol cravings. Alcohol use with alcohol-induced mood disorder (HCC) In my medical opinion, it is unsafe to stop Lexapro /seroquel  today, as she is struggling to maintain sobriety from alcohol in the wake of anxiety provoking pregnancy. I.e benefits outweigh risks.   The single most important teratogenic substance to avoid, by far, is alcohol and this was emphasized The risks and benefits of continuing these medications during pregnancy were discussed. Lexapro  and  Auvelity  are category C, and Seroquel  is associated with gestational diabetes and neonatal withdrawal. The benefits of continuing medications outweigh the risks, especially to prevent alcohol relapse. A slow taper of Seroquel  is planned if stability is maintained. Consider switching Lexapro  to sertraline, which is safer during pregnancy. Continue Lexapro , Auvelity , and  Seroquel , plan a slow taper of Seroquel  in the future if stable, and consider switching Lexapro  to sertraline if needed. Moderate episode of recurrent major depressive disorder (HCC) Anxiety/Depression - Active medication management. Patient remains on Lexapro , Auvelity , and Seroquel ; current regimen is maintained but not at optimal safety parameters given pregnancy. Plan to continue Lexapro  with ongoing monitoring for efficacy and adverse effects while considering a future switch to sertraline; Seroquel  taper is planned if stability persists. Follow up in 4 weeks to evaluate mental health and medication tolerability. Nicotine  dependence due to vaping tobacco product Nicotine  Use - Stable; routine follow-up. Patient continues vaping. Recommend transitioning to alternative nicotine  replacement (e.g., Zyn pouches, patches, or gum) to minimize fetal exposure. Continue counseling and monitor usage at routine visits with follow-up in 4 weeks. 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  General Health Maintenance - Stable. Allergies (managed with Xyzal ), GERD (on Prilosec), and episodic headaches (treated with as-needed Zofran  and Maxalt ) remain controlled. Continue current therapies with routine monitoring.  ORDER ASSOCIATIONS  #   DIAGNOSIS / CONDITION ICD-10 ENCOUNTER ORDER     ICD-10-CM   1. Alcohol use disorder, severe, in early remission, dependence (HCC)  F10.21     2. Alcohol use with alcohol-induced mood disorder (HCC)  F10.94 hCG, quantitative, pregnancy    Vitamin B1    Folate    B12 and Folate Panel    Vitamin D  (25 hydroxy)    Iron, TIBC and Ferritin Panel    Magnesium     Comp Met (CMET)    Zinc     Vitamin A     Vitamin E    Homocysteine    Methylmalonic Acid    acamprosate  (CAMPRAL ) 333 MG tablet    3. High-risk pregnancy in first trimester  O09.91 hCG, quantitative, pregnancy     Vitamin B1    Folate    B12 and Folate Panel    Vitamin D  (25 hydroxy)    Iron, TIBC and Ferritin Panel    Magnesium     Comp Met (CMET)    Zinc     Vitamin A     Vitamin E    Homocysteine    Methylmalonic Acid    acamprosate  (CAMPRAL ) 333 MG tablet     Meds ordered this encounter  Medications   acamprosate  (CAMPRAL ) 333 MG tablet    Sig: Take 2 tablets (666 mg total) by mouth 3 (three) times daily.    Dispense:  180 tablet    Refill:  3     This document was synthesized by artificial intelligence (Abridge) using HIPAA-compliant recording of the clinical interaction;   We discussed the use of AI scribe software for clinical note transcription with the patient, who gave verbal consent to proceed. additional Info: This encounter employed state-of-the-art, real-time, collaborative documentation. The patient actively reviewed and assisted in updating their electronic medical record on a shared screen, ensuring transparency and facilitating joint problem-solving for the problem list, overview, and plan. This approach promotes accurate, informed care. The treatment plan was discussed and reviewed in detail, including medication safety, potential side effects, and all patient questions. We confirmed  understanding and comfort with the plan. Follow-up instructions were established, including contacting the office for any concerns, returning if symptoms worsen, persist, or new symptoms develop, and precautions for potential emergency department visits.

## 2024-01-27 NOTE — Assessment & Plan Note (Signed)
 Nicotine  Use - Stable; routine follow-up. Patient continues vaping. Recommend transitioning to alternative nicotine  replacement (e.g., Zyn pouches, patches, or gum) to minimize fetal exposure. Continue counseling and monitor usage at routine visits with follow-up in 4 weeks.

## 2024-01-28 ENCOUNTER — Ambulatory Visit: Payer: Self-pay | Admitting: Internal Medicine

## 2024-01-28 NOTE — Progress Notes (Signed)
 Final results: deficient in potassium, iron, B12, folate,  vitamin D .   Needs to eat double helping cereal and milk daily as its fortified in all this stuff.

## 2024-01-31 LAB — ZINC: Zinc: 43 ug/dL — ABNORMAL LOW (ref 60–130)

## 2024-01-31 LAB — IRON,TIBC AND FERRITIN PANEL
%SAT: 11 % — ABNORMAL LOW (ref 16–45)
Ferritin: 35 ng/mL (ref 16–154)
Iron: 42 ug/dL (ref 40–190)
TIBC: 377 ug/dL (ref 250–450)

## 2024-01-31 LAB — VITAMIN E
Gamma-Tocopherol (Vit E): 3.2 mg/L (ref <4.4)
Vitamin E (Alpha Tocopherol): 8 mg/L (ref 5.7–19.9)

## 2024-01-31 LAB — HOMOCYSTEINE: Homocysteine: 37.2 umol/L — ABNORMAL HIGH (ref <=10.9)

## 2024-01-31 LAB — VITAMIN A: Vitamin A (Retinoic Acid): 26 ug/dL — ABNORMAL LOW (ref 38–98)

## 2024-01-31 LAB — METHYLMALONIC ACID, SERUM: Methylmalonic Acid, Quant: 120 nmol/L (ref 55–335)

## 2024-01-31 LAB — VITAMIN B1: Vitamin B1 (Thiamine): 8 nmol/L (ref 8–30)

## 2024-01-31 NOTE — Progress Notes (Signed)
 Low on vitamin a , zinc - also other minerals; be sure to get multivitamin that includes minerals.  And make sure plenty of vitamin a  in food or vitamin.

## 2024-02-02 ENCOUNTER — Other Ambulatory Visit: Payer: Self-pay | Admitting: Internal Medicine

## 2024-02-02 DIAGNOSIS — F4001 Agoraphobia with panic disorder: Secondary | ICD-10-CM

## 2024-02-03 ENCOUNTER — Other Ambulatory Visit: Payer: Self-pay | Admitting: Internal Medicine

## 2024-02-03 DIAGNOSIS — F5105 Insomnia due to other mental disorder: Secondary | ICD-10-CM

## 2024-02-12 ENCOUNTER — Ambulatory Visit

## 2024-02-12 ENCOUNTER — Other Ambulatory Visit (INDEPENDENT_AMBULATORY_CARE_PROVIDER_SITE_OTHER): Payer: Self-pay

## 2024-02-12 ENCOUNTER — Other Ambulatory Visit (HOSPITAL_COMMUNITY)
Admission: RE | Admit: 2024-02-12 | Discharge: 2024-02-12 | Disposition: A | Discharge status: Home / Self Care | Source: Ambulatory Visit | Attending: Obstetrics and Gynecology | Admitting: Obstetrics and Gynecology

## 2024-02-12 VITALS — BP 130/93 | HR 64 | Wt 192.4 lb

## 2024-02-12 DIAGNOSIS — Z1331 Encounter for screening for depression: Secondary | ICD-10-CM | POA: Diagnosis not present

## 2024-02-12 DIAGNOSIS — O0990 Supervision of high risk pregnancy, unspecified, unspecified trimester: Secondary | ICD-10-CM | POA: Diagnosis not present

## 2024-02-12 DIAGNOSIS — O0991 Supervision of high risk pregnancy, unspecified, first trimester: Secondary | ICD-10-CM | POA: Diagnosis not present

## 2024-02-12 DIAGNOSIS — Z3A10 10 weeks gestation of pregnancy: Secondary | ICD-10-CM | POA: Diagnosis not present

## 2024-02-12 DIAGNOSIS — O3680X0 Pregnancy with inconclusive fetal viability, not applicable or unspecified: Secondary | ICD-10-CM

## 2024-02-12 MED ORDER — BLOOD PRESSURE KIT DEVI
1.0000 | 0 refills | Status: AC
Start: 2024-02-12 — End: ?

## 2024-02-12 MED ORDER — PRENATE PIXIE 10-0.6-0.4-200 MG PO CAPS: 1.0000 | ORAL_CAPSULE | Freq: Every day | ORAL | 11 refills | Status: AC

## 2024-02-12 NOTE — Progress Notes (Cosign Needed Addendum)
 New OB Intake  I connected with Natasha Pope   on 02/12/24 at  1:10 PM EDT by In Person Visit and verified that I am speaking with the correct person using two identifiers. Nurse is located at [facility] and pt is located at [place].  I discussed the limitations, risks, security and privacy concerns of performing an evaluation and management service by telephone and the availability of in person appointments. I also discussed with the patient that there may be a patient responsible charge related to this service. The patient expressed understanding and agreed to proceed.  I explained I am completing New OB Intake today. We discussed EDD of 09/04/24 based on US  at [redacted]w[redacted]d weeks. Pt is G1P0000. I reviewed her allergies, medications and Medical/Surgical/OB history.    Patient Active Problem List   Diagnosis Date Noted   History of alcohol use disorder 12/25/2023   Cannabis use with anxiety disorder (HCC) 12/25/2023   Parasomnia 12/25/2023   Pruritic dermatitis 12/25/2023   Migraine 03/04/2023   Nausea 03/04/2023   Contraceptive management 01/21/2023   GERD (gastroesophageal reflux disease) 01/21/2023   Gastroenteritis 10/22/2022   Financial difficulties 10/04/2022   Snoring 04/17/2022   Obesity (BMI 30-39.9) 04/17/2022   Memory loss 04/03/2022   PTSD (post-traumatic stress disorder) 03/19/2022   Auditory hallucinations 01/24/2021   Visual hallucination 01/24/2021   Chronic abdominal pain 01/24/2021   Delusional disorder (HCC)    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/2019   Alcohol use disorder, severe, in early remission, dependence (HCC) 12/27/2018   Recurrent major depressive disorder (HCC) 12/27/2018   Alcohol use with alcohol-induced mood disorder (HCC)    Substance induced mood disorder (HCC) 12/09/2018   Macrocytosis 05/19/2018   ASCUS of cervix with negative high risk HPV 03/07/2018   Nicotine  dependence due to vaping tobacco product 01/31/2018   Asthma 01/31/2018   GAD  (generalized anxiety disorder) 01/31/2018     Concerns addressed today  Delivery Plans Plans to deliver at [facility]. Discussed the nature of our practice with multiple providers including residents and students. Due to the size of the practice, the delivering provider may not be the same as those providing prenatal care.   Patient is not interested in water  birth.  MyChart/Babyscripts MyChart access verified. I explained pt will have some visits in office and some virtually. Babyscripts instructions given and order placed. Patient verifies receipt of registration text/e-mail. Account successfully created and app downloaded. If patient is a candidate for Optimized scheduling, add to sticky note.   Blood Pressure Cuff/Weight Scale Blood pressure cuff ordered for patient to pick-up from [employer]. Explained after first prenatal appt pt will check weekly and document in Babyscripts. Patient does not have weight scale; patient may purchase if they desire to track weight weekly in Babyscripts.  Anatomy US  Explained first scheduled US  will be around 19 weeks. Anatomy US  scheduled for TBD at TBD.  Is patient a candidate for Babyscripts Optimization? No, due to High Risk   First visit review I reviewed new OB appt with patient. Explained pt will be seen by Jorene Moats, PA at first visit. Discussed [place] genetic screening with patient. Requests Panorama and Horizon.. Routine prenatal labs collected at today's visit.   Last Pap Diagnosis  Date Value Ref Range Status  08/29/2022   Final   - Negative for intraepithelial lesion or malignancy (NILM)    Rocky CHRISTELLA Ober, RN 02/12/2024  1:12 PM

## 2024-02-12 NOTE — Patient Instructions (Signed)
 [facility] [employer] has a partnership with the [employer] to provide prenatal navigation for the most needed resources in our community. In order to see how we can help connect you to these resources we need consent to contact you. Please complete the very short consent using the link below:   English Link: [email or web address].[email or web address]?[email or web address]  Spanish Link: https://[email or web address].tfaforms.[email or web address]?[email or web address]  Our practice his participating in a study that provides no-cost doula care. ACURE4Moms is a study looking at how doula care can reduce birthing disparities for Black and brown birthing people. We like to refer patients as soon as possible, but definitely before 28 weeks so patients can get to know their doula.    A doula is trained to provide support before, during and just after you give birth. While doulas do not provide medical care, they do provide emotional, physical and educational support. Doulas can help reduce your stress and comfort you and your partner. They can help you cope with labor by helping you use breathing techniques, massage, creative labor positioning, essential oils and affirmations.   ACURE4Moms is a research study trying to reduce:   low birthweight babies  emergency department visits & hospitalizations for birthing persons and their babies  depression among birthing people  discrimination in pregnancy-related care ACURE4Moms is trying out 2 programs designed by  people who have given birth. These programs include: 1. Sharing patient data and warning alerts with clinic staff to keep them accountable for their patients' outcomes and providing tools to help them  reduce bias in care. 2. Matching eligible patients with doulas from the  same community as the patients.  If you would like to participate in this study, please visit:   http://carroll-castaneda.info/  Options for Doula Care in the [place] Area  As you review  your birthing options, consider having a birth doula. A doula is trained to provide support before, during and just after you give birth. There are also postpartum doulas that help you adjust to new parenthood.  While doulas do not provide medical care, they do provide emotional, physical and educational support. A few months before your baby arrives, doulas can help answer questions, ease concerns and help you create and support your birthing plan.    Doulas can help reduce your stress and comfort you and your partner. They can help you cope with labor by helping you use breathing techniques, massage, creative labor positioning, essential oils and affirmations.   Studies show that the benefits of having a doula include:   A more positive birth experience  Fewer requests for pain-relief medication  Less likelihood of cesarean section, commonly called a c-section   Doulas are typically hired via a [employer] account planner between you and the doula. We are happy to provide a list of the most active doulas in the area, all of whom are credentialed by [facility] and will not count as a visitor at your birth.  There are several options for no-cost doula care at our hospital, including:  [facility] Volunteer Doula Program Every [employer] Program A Cure 4 Moms Doula Study (available only at [employer] [facility], [place], [place] and [employer] [facility] offices)  For more information on these programs or to receive a list of doulas active in our area, please email [email or web address]

## 2024-02-13 ENCOUNTER — Ambulatory Visit: Payer: Self-pay | Admitting: Obstetrics and Gynecology

## 2024-02-13 LAB — CBC/D/PLT+RPR+RH+ABO+RUBIGG...
Antibody Screen: NEGATIVE
Basophils Absolute: 0 x10E3/uL (ref 0.0–0.2)
Basos: 0 %
EOS (ABSOLUTE): 0.2 x10E3/uL (ref 0.0–0.4)
Eos: 1 %
HCV Ab: NONREACTIVE
HIV Screen 4th Generation wRfx: NONREACTIVE
Hematocrit: 39.3 % (ref 34.0–46.6)
Hemoglobin: 12.7 g/dL (ref 11.1–15.9)
Hepatitis B Surface Ag: NEGATIVE
Immature Grans (Abs): 0 x10E3/uL (ref 0.0–0.1)
Immature Granulocytes: 0 %
Lymphocytes Absolute: 2.5 x10E3/uL (ref 0.7–3.1)
Lymphs: 20 %
MCH: 32.8 pg (ref 26.6–33.0)
MCHC: 32.3 g/dL (ref 31.5–35.7)
MCV: 102 fL — ABNORMAL HIGH (ref 79–97)
Monocytes Absolute: 0.6 x10E3/uL (ref 0.1–0.9)
Monocytes: 5 %
Neutrophils Absolute: 9.2 x10E3/uL — ABNORMAL HIGH (ref 1.4–7.0)
Neutrophils: 74 %
Platelets: 294 x10E3/uL (ref 150–450)
RBC: 3.87 x10E6/uL (ref 3.77–5.28)
RDW: 18.5 % — ABNORMAL HIGH (ref 11.7–15.4)
RPR Ser Ql: NONREACTIVE
Rh Factor: POSITIVE
Rubella Antibodies, IGG: 2.65 {index} (ref >0.99)
WBC: 12.5 x10E3/uL — ABNORMAL HIGH (ref 3.4–10.8)

## 2024-02-13 LAB — CERVICOVAGINAL ANCILLARY ONLY
Chlamydia: NEGATIVE
Comment: NEGATIVE
Comment: NORMAL
Neisseria Gonorrhea: NEGATIVE

## 2024-02-13 LAB — HEMOGLOBIN A1C
Est. average glucose Bld gHb Est-mCnc: 100 mg/dL
Hgb A1c MFr Bld: 5.1 % (ref 4.8–5.6)

## 2024-02-13 LAB — HCV INTERPRETATION

## 2024-02-15 LAB — CULTURE, OB URINE

## 2024-02-15 LAB — URINE CULTURE, OB REFLEX

## 2024-02-18 LAB — PANORAMA PRENATAL TEST FULL PANEL:PANORAMA TEST PLUS 5 ADDITIONAL MICRODELETIONS
FETAL FRACTION: 2.1
REPORT SUMMARY: INCREASED — AB
TRIPLOIDY 13 18 RESULT TEXT: INCREASED — AB

## 2024-02-19 ENCOUNTER — Encounter: Payer: Self-pay | Admitting: Physician Assistant

## 2024-02-19 LAB — HORIZON CUSTOM: REPORT SUMMARY: NEGATIVE

## 2024-02-21 ENCOUNTER — Telehealth: Payer: Self-pay

## 2024-02-21 ENCOUNTER — Encounter: Payer: Self-pay | Admitting: Internal Medicine

## 2024-02-21 ENCOUNTER — Ambulatory Visit: Admitting: Internal Medicine

## 2024-02-21 VITALS — BP 118/78 | HR 97 | Temp 98.0°F | Ht 65.0 in | Wt 188.6 lb

## 2024-02-21 DIAGNOSIS — F411 Generalized anxiety disorder: Secondary | ICD-10-CM

## 2024-02-21 DIAGNOSIS — G4489 Other headache syndrome: Secondary | ICD-10-CM | POA: Diagnosis not present

## 2024-02-21 DIAGNOSIS — Z3A12 12 weeks gestation of pregnancy: Secondary | ICD-10-CM

## 2024-02-21 DIAGNOSIS — F17290 Nicotine dependence, other tobacco product, uncomplicated: Secondary | ICD-10-CM

## 2024-02-21 DIAGNOSIS — O0992 Supervision of high risk pregnancy, unspecified, second trimester: Secondary | ICD-10-CM

## 2024-02-21 MED ORDER — METOCLOPRAMIDE HCL 5 MG PO TABS: 5.0000 mg | ORAL_TABLET | Freq: Four times a day (QID) | ORAL | 1 refills | Status: DC | PRN

## 2024-02-21 MED ORDER — SUMATRIPTAN SUCCINATE 50 MG PO TABS: 50.0000 mg | ORAL_TABLET | Freq: Every day | ORAL | 5 refills | Status: AC | PRN

## 2024-02-21 MED ORDER — DIPHENHYDRAMINE HCL 25 MG PO CAPS: 25.0000 mg | ORAL_CAPSULE | Freq: Four times a day (QID) | ORAL | 0 refills | Status: AC | PRN

## 2024-02-21 NOTE — Telephone Encounter (Signed)
 Copied from CRM [id number]. Topic: Clinical - Medication Question >> Feb 21, 2024 12:59 PM Burnard DEL wrote: Reason for RMF:Ejupzwu called in stating that pharmacy advised her that metoCLOPramide  (REGLAN ) 5 MG tablet medication that was prescribed by provider would interact with another medication that she is taking. The pharmacy did not advise her as to which one it was but they will not fill the medication for her. Patient would like for provider or assistant to call the pharmacy as soon as possible to figure out what's going on.  Called pharmacy and advise to not fill Reglan .

## 2024-02-21 NOTE — Progress Notes (Signed)
 ==============================  [facility]: [phone]   -- Medical Office Visit --  Patient: Natasha Pope       Age: 28 y.o.       Sex:  female  Date:   02/21/2024 Today's Healthcare Provider: Bernardino KANDICE Cone, MD  ==============================   Chief Complaint: Headache (Really bad headache for week now and really tired )   Discussed the use of AI scribe software for clinical note transcription with the patient, who gave verbal consent to proceed.  History of Present Illness  28 year old female with a history of migraines and anxiety who presents with severe headaches and anxiety during pregnancy.  She is experiencing severe headaches primarily located in the temples and forehead, described as 'banging' and persistent over the past week. These headaches are not relieved by over-the-counter medications such as Excedrin or Tylenol . She has a history of migraines, but notes that these headaches are different in intensity and location. She is currently [redacted] weeks pregnant and has been off Auvelity  for about a week, which coincides with the onset of her headaches.  She is experiencing significant anxiety, exacerbated by concerns over her pregnancy. Recent tests indicated a low fetal fraction, raising concerns about potential chromosomal abnormalities, which has heightened her anxiety. She has had two emotional breakdowns since stopping Auvelity . She is currently taking Buspar  daily for anxiety and Lexapro  daily for mood stabilization. She has Seroquel  available but is not currently taking it. She is also taking prenatal vitamins, including two different types, one prescribed and one from [employer].  She reports good sleep, often sleeping for extended periods without realizing she was tired. She mentions feeling too hot during showers and prefers taking baths instead. She is ensuring adequate hydration and nutrition despite not feeling hungry.  In  terms of social history, she vapes but does not smoke cigarettes. She is considering alternatives to vaping. Denies swelling in the legs. Reports increased breathing difficulty, which she attributes to pregnancy and allergies.   Background Reviewed: Problem List: has Nicotine  dependence due to vaping tobacco product; Asthma; GAD (generalized anxiety disorder); ASCUS of cervix with negative high risk HPV; Macrocytosis; Substance induced mood disorder (HCC); Alcohol use with alcohol-induced mood disorder (HCC); Alcohol use disorder, severe, in early remission, dependence (HCC); Recurrent major depressive disorder (HCC); Panic disorder with agoraphobia; Delusional disorder (HCC); Auditory hallucinations; Visual hallucination; Chronic abdominal pain; PTSD (post-traumatic stress disorder); Memory loss; Snoring; Obesity (BMI 30-39.9); Financial difficulties; Gastroenteritis; Contraceptive management; GERD (gastroesophageal reflux disease); Migraine; Nausea; History of alcohol use disorder; Cannabis use with anxiety disorder (HCC); Parasomnia; Pruritic dermatitis; and Supervision of high risk pregnancy, antepartum on their problem list. Past Medical History:  has a past medical history of Anxiety, Asthma, Depression, Gastroenteritis (10/22/2022), History of alcohol withdrawal delirium (07/04/2022), History of seizure due to alcohol withdrawal (02/28/2021), Hypertension, Memory loss (04/03/2022), Nausea vomiting and diarrhea (06/27/2022), Nicotine  dependence due to vaping tobacco product (01/31/2018), 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 (04/17/2022), PTSD (post-traumatic stress disorder) (03/19/2022), Pyelonephritis, Seizure-like activity (HCC), Seizures (HCC), Sepsis (HCC), Snoring (04/17/2022), Thrombocytopenia (HCC) (05/19/2018), Unwanted pregnancy (11/26/2022), and Withdrawal syndrome (HCC) (09/27/2022). Past Surgical History:   has a past surgical history that includes dental procedure. Social History:   reports  that she quit smoking about 5 years ago. Her smoking use included cigarettes and e-cigarettes. She has never used smokeless tobacco. She reports that she does not currently use alcohol. She reports current drug use. Drug: Marijuana. Family History:  family history includes  Alcohol abuse in her father, maternal grandfather, and paternal grandmother; Depression in her mother; Hyperlipidemia in her maternal grandmother; Hypertension in her father and paternal grandfather; Learning disabilities in her sister; Miscarriages / Stillbirths in her mother; Thyroid  disease in her mother. Allergies:  is allergic to lamotrigine  and pineapple.   Medication Reconciliation: Current Outpatient Medications on File Prior to Visit  Medication Sig   Blood Pressure Monitoring (BLOOD PRESSURE KIT) DEVI 1 Device by Does not apply route once a week.   busPIRone  (BUSPAR ) 15 MG tablet TAKE ONE TABLET BY MOUTH THREE TIMES DAILY (MUST BE TAKEN CONSISTENTLY)   escitalopram  (LEXAPRO ) 20 MG tablet TAKE ONE TABLET BY MOUTH ONCE A DAY   levocetirizine (XYZAL ) 5 MG tablet Take 1 tablet (5 mg total) by mouth every evening.   omeprazole  (PRILOSEC) 20 MG capsule Take 1 capsule (20 mg total) by mouth daily.   Prenat-FeAsp-Meth-FA-DHA w/o A (PRENATE PIXIE ) 10-0.6-0.4-200 MG CAPS Take 1 tablet by mouth daily.   Prenatal Vit-Fe Fumarate-FA (PRENATAL VITAMINS PO) Take 1 tablet by mouth daily.   propranolol  ER (INDERAL  LA) 60 MG 24 hr capsule Take 1 capsule (60 mg total) by mouth daily.   QUEtiapine  (SEROQUEL ) 100 MG tablet TAKE ONE-HALF TABLET (50MG ) BY MOUTH ONCE DAILY IN THE MORNING AND ONE AND ONE-HALF TABLET (150MG ) DAILY IN THE EVENING *OK TO TAKE WITH ELEVATE*   Dextromethorphan -buPROPion  ER (AUVELITY ) 45-105 MG TBCR Take 1 tablet by mouth 2 (two) times daily. To start, take just one tablet once daily in the morning. After 3 days, increase to the maximum recommended dosage of one tablet twice daily, given at least 8 hours apart. Do not  exceed two doses within the same day. (Patient not taking: Reported on 02/21/2024)   [DISCONTINUED] Dextromethorphan  HBr 15 MG TABS Take 3 tablets by mouth daily at 6 (six) AM.   No current facility-administered medications on file prior to visit.   Medications Discontinued During This Encounter  Medication Reason   thiamine  (VITAMIN B-1) 100 MG tablet    rizatriptan  (MAXALT ) 5 MG tablet      Physical Exam:    02/21/2024    9:57 AM 02/12/2024    1:32 PM 01/27/2024   10:35 AM  Vitals with BMI  Height 5' 5  5' 5  Weight 188 lbs 10 oz 192 lbs 6 oz 186 lbs 3 oz  BMI 31.38 32.02 30.99  Systolic 118 130 889  Diastolic 78 93 72  Pulse 97 64 77  Vital signs reviewed.  Nursing notes reviewed. Weight trend reviewed. Physical Exam General Appearance:  No acute distress appreciable.   Well-groomed, healthy-appearing female.  Well proportioned with no abnormal fat distribution.  Good muscle tone. Pulmonary:  Normal work of breathing at rest, no respiratory distress apparent. SpO2: 98 %  Musculoskeletal: All extremities are intact.  Neurological:  Awake, alert, oriented, and engaged.  No obvious focal neurological deficits or cognitive impairments.  Sensorium seems unclouded.   Speech is clear and coherent with logical content. Psychiatric:  Appropriate mood, pleasant and cooperative demeanor, thoughtful and engaged during the exam     02/12/2024    1:49 PM 12/25/2023   11:20 AM 09/17/2023    7:38 AM 07/22/2023    1:14 PM  PHQ 2/9 Scores  PHQ - 2 Score 0 0 0 1  PHQ- 9 Score 0 3 0 3   Results LABS   Fetal fraction: 2.1%    No results found for any visits on 02/21/24. Clinical Support on 02/12/2024  Component Date Value Ref Range Status   Hepatitis B Surface Ag 02/12/2024 Negative  Negative Final   HCV Ab 02/12/2024 Non Reactive  Non Reactive Final   RPR Ser Ql 02/12/2024 Non Reactive  Non Reactive Final   Rubella Antibodies, IGG 02/12/2024 2.65  Immune >0.99 index Final   ABO Grouping  02/12/2024 O   Final   Rh Factor 02/12/2024 Positive   Final   Antibody Screen 02/12/2024 Negative  Negative Final   HIV Screen 4th Generation wRfx 02/12/2024 Non Reactive  Non Reactive Final   WBC 02/12/2024 12.5 (H)  3.4 - 10.8 x10E3/uL Final   RBC 02/12/2024 3.87  3.77 - 5.28 x10E6/uL Final   Hemoglobin 02/12/2024 12.7  11.1 - 15.9 g/dL Final   Hematocrit 92/76/7974 39.3  34.0 - 46.6 % Final   MCV 02/12/2024 102 (H)  79 - 97 fL Final   MCH 02/12/2024 32.8  26.6 - 33.0 pg Final   MCHC 02/12/2024 32.3  31.5 - 35.7 g/dL Final   RDW 92/76/7974 18.5 (H)  11.7 - 15.4 % Final   Platelets 02/12/2024 294  150 - 450 x10E3/uL Final   Neutrophils 02/12/2024 74  Not Estab. % Final   Lymphs 02/12/2024 20  Not Estab. % Final   Monocytes 02/12/2024 5  Not Estab. % Final   Eos 02/12/2024 1  Not Estab. % Final   Basos 02/12/2024 0  Not Estab. % Final   Neutrophils Absolute 02/12/2024 9.2 (H)  1.4 - 7.0 x10E3/uL Final   Lymphocytes Absolute 02/12/2024 2.5  0.7 - 3.1 x10E3/uL Final   Monocytes Absolute 02/12/2024 0.6  0.1 - 0.9 x10E3/uL Final   EOS (ABSOLUTE) 02/12/2024 0.2  0.0 - 0.4 x10E3/uL Final   Basophils Absolute 02/12/2024 0.0  0.0 - 0.2 x10E3/uL Final   Immature Granulocytes 02/12/2024 0  Not Estab. % Final   Immature Grans (Abs) 02/12/2024 0.0  0.0 - 0.1 x10E3/uL Final   Urine Culture, OB 02/12/2024 Final report   Final   Neisseria Gonorrhea 02/12/2024 Negative   Final   Chlamydia 02/12/2024 Negative   Final   Comment 02/12/2024 Normal Reference Ranger Chlamydia - Negative   Final   Comment 02/12/2024 Normal Reference Range Neisseria Gonorrhea - Negative   Final   Hgb A1c MFr Bld 02/12/2024 5.1  4.8 - 5.6 % Final   Est. average glucose Bld gHb Est-m* 02/12/2024 100  mg/dL Final   REPORT SUMMARY 02/12/2024 INCREASED RISK due to low fetal fraction (A)   Final   REPORT NOTE 02/12/2024 See Notes (A)   Final   GENDER OF FETUS 02/12/2024 N/A   Final   FETAL FRACTION 02/12/2024 2.1%   Final    TRISOMY 21 RESULT TEXT 02/12/2024 No Result   Final   TRISOMY 21 AGE-BASED RISK TEXT 02/12/2024 1/870 (0.11%)   Final   TRISOMY 21 RISK SCORE TEXT 02/12/2024 N/A   Final   MONOSOMY X RESULT TEXT 02/12/2024 No Result   Final   MONOSOMY X AGE-BASED RISK TEXT 02/12/2024 1/255 (0.39%)   Final   MONOSOMY X RISK SCORE TEXT 02/12/2024 N/A   Final   TRIPLOIDY 13 18 RESULT TEXT 02/12/2024 Increased Risk (A)   Final   TRIPLOIDY 13 18 AGE-BASED RISK TEXT 02/12/2024 1/1,050 (0.1%) (A)   Final   TRIPLOIDY 13 18 RISK SCORE TEXT 02/12/2024 1/17 (5.88%) (A)   Final   22Q11.2 DELETION SYNDROME RESULT T* 02/12/2024 No Result   Final   22Q11.2 DELETION SYNDROME POPULATI* 02/12/2024 1/2,000  Final   22Q11.2 DELETION SYNDROME RISK SCO* 02/12/2024 N/A   Final   FOOTNOTES 02/12/2024 See Notes   Final   REPORT SUMMARY 02/12/2024 Negative   Final   PANEL NAME 02/12/2024 HCS_OTHER   Final   PANEL NOTES 02/12/2024 See Notes   Final   REPORT NOTE 02/12/2024 See Notes   Final   FOOTNOTES 02/12/2024 See Notes   Final   HCV Interp 1: 02/12/2024 Comment   Final   Organism ID, Bacteria 02/12/2024 Comment   Final  Office Visit on 01/27/2024  Component Date Value Ref Range Status   Quantitative HCG 01/27/2024 77,949.00  mIU/ml Final   Vitamin B1 (Thiamine ) 01/27/2024 8  8 - 30 nmol/L Final   Folate 01/27/2024 2.6 (L)  >5.9 ng/mL Final   Vitamin B-12 01/27/2024 146 (L)  211 - 911 pg/mL Final   Folate 01/27/2024 2.6 (L)  >5.9 ng/mL Final   VITD 01/27/2024 9.66 (L)  30.00 - 100.00 ng/mL Final   Iron 01/27/2024 42  40 - 190 mcg/dL Final   TIBC 92/92/7974 377  250 - 450 mcg/dL (calc) Final   %SAT 92/92/7974 11 (L)  16 - 45 % (calc) Final   Ferritin 01/27/2024 35  16 - 154 ng/mL Final   Magnesium  01/27/2024 1.7  1.5 - 2.5 mg/dL Final   Sodium 92/92/7974 138  135 - 145 mEq/L Final   Potassium 01/27/2024 3.0 (L)  3.5 - 5.1 mEq/L Final   Chloride 01/27/2024 103  96 - 112 mEq/L Final   CO2 01/27/2024 26  19 - 32 mEq/L Final    Glucose, Bld 01/27/2024 81  70 - 99 mg/dL Final   BUN 92/92/7974 3 (L)  6 - 23 mg/dL Final   Creatinine, Ser 01/27/2024 0.60  0.40 - 1.20 mg/dL Final   Total Bilirubin 01/27/2024 0.4  0.2 - 1.2 mg/dL Final   Alkaline Phosphatase 01/27/2024 70  39 - 117 U/L Final   AST 01/27/2024 13  0 - 37 U/L Final   ALT 01/27/2024 9  0 - 35 U/L Final   Total Protein 01/27/2024 6.5  6.0 - 8.3 g/dL Final   Albumin 92/92/7974 3.6  3.5 - 5.2 g/dL Final   GFR 92/92/7974 123.23  >60.00 mL/min Final   Calcium  01/27/2024 9.0  8.4 - 10.5 mg/dL Final   Zinc  01/27/2024 43 (L)  60 - 130 mcg/dL Final   Vitamin A  (Retinoic Acid) 01/27/2024 26 (L)  38 - 98 mcg/dL Final   Vitamin E (Alpha Tocopherol) 01/27/2024 8.0  5.7 - 19.9 mg/L Final   Gamma-Tocopherol (Vit E) 01/27/2024 3.2  <4.4 mg/L Final   Homocysteine 01/27/2024 37.2 (H)  < or = 10.9 umol/L Final   Methylmalonic Acid, Quant 01/27/2024 120  55 - 335 nmol/L Final  Office Visit on 01/07/2024  Component Date Value Ref Range Status   Preg Test, Ur 01/07/2024 Positive (A)  Negative Final  Office Visit on 09/27/2023  Component Date Value Ref Range Status   SARS Coronavirus 2 Ag 09/27/2023 Negative  Negative Final   Rapid Strep A Screen 09/27/2023 Negative  Negative Final  Office Visit on 09/17/2023  Component Date Value Ref Range Status   Preg Test, Ur 09/17/2023 Negative  Negative Final   Color, UA 09/17/2023 yellow   Final   Clarity, UA 09/17/2023 cloudy   Final   Glucose, UA 09/17/2023 Negative  Negative Final   Bilirubin, UA 09/17/2023 positive   Final   Ketones, UA 09/17/2023 positive   Final  Spec Grav, UA 09/17/2023 >=1.030 (A)  1.010 - 1.025 Final   Blood, UA 09/17/2023 negative   Final   pH, UA 09/17/2023 6.0  5.0 - 8.0 Final   Protein, UA 09/17/2023 Positive (A)  Negative Final   Urobilinogen, UA 09/17/2023 negative (A)  0.2 or 1.0 E.U./dL Final   Nitrite, UA 97/74/7974 negative   Final   Leukocytes, UA 09/17/2023 Trace (A)  Negative Final    MICRO NUMBER: 09/17/2023 [id number]   Final   SPECIMEN QUALITY: 09/17/2023 Adequate   Final   Sample Source 09/17/2023 URINE   Final   STATUS: 09/17/2023 FINAL   Final   Result: 09/17/2023    Final                   Value:Mixed genital flora isolated. These superficial bacteria are not indicative of a urinary tract infection. No further organism identification is warranted on this specimen. If clinically indicated, recollect clean-catch, mid-stream urine and transfer  immediately to Urine Culture Transport Tube.   Office Visit on 06/17/2023  Component Date Value Ref Range Status   Rapid Strep A Screen 06/17/2023 Negative  Negative Final   Influenza A, POC 06/17/2023 Negative  Negative Final   Influenza B, POC 06/17/2023 Negative  Negative Final   SARS Coronavirus 2 Ag 06/17/2023 Negative  Negative Final  Office Visit on 02/01/2023  Component Date Value Ref Range Status   Rapid Strep A Screen 02/01/2023 Negative  Negative Final   SARS Coronavirus 2 Ag 02/01/2023 Negative  Negative Final   Glucose, Bld 02/01/2023 99  65 - 99 mg/dL Final   BUN 92/87/7975 8  7 - 25 mg/dL Final   Creat 92/87/7975 0.99 (H)  0.50 - 0.96 mg/dL Final   BUN/Creatinine Ratio 02/01/2023 8  6 - 22 (calc) Final   Sodium 02/01/2023 134 (L)  135 - 146 mmol/L Final   Potassium 02/01/2023 3.0 (L)  3.5 - 5.3 mmol/L Final   Chloride 02/01/2023 90 (L)  98 - 110 mmol/L Final   CO2 02/01/2023 24  20 - 32 mmol/L Final   Calcium  02/01/2023 9.6  8.6 - 10.2 mg/dL Final   Total Protein 92/87/7975 8.2 (H)  6.1 - 8.1 g/dL Final   Albumin 92/87/7975 5.0  3.6 - 5.1 g/dL Final   Globulin 92/87/7975 3.2  1.9 - 3.7 g/dL (calc) Final   AG Ratio 02/01/2023 1.6  1.0 - 2.5 (calc) Final   Total Bilirubin 02/01/2023 0.9  0.2 - 1.2 mg/dL Final   Alkaline phosphatase (APISO) 02/01/2023 149 (H)  31 - 125 U/L Final   AST 02/01/2023 107 (H)  10 - 30 U/L Final   ALT 02/01/2023 52 (H)  6 - 29 U/L Final   Hgb A1c MFr Bld 02/01/2023 5.1  <5.7 % of  total Hgb Final   Mean Plasma Glucose 02/01/2023 100  mg/dL Final   eAG (mmol/L) 92/87/7975 5.5  mmol/L Final  Office Visit on 08/29/2022  Component Date Value Ref Range Status   High risk HPV 08/29/2022 Negative   Final   Neisseria Gonorrhea 08/29/2022 Negative   Final   Chlamydia 08/29/2022 Negative   Final   Trichomonas 08/29/2022 Negative   Final   Adequacy 08/29/2022 Satisfactory for evaluation; transformation zone component PRESENT.   Final   Diagnosis 08/29/2022 - Negative for intraepithelial lesion or malignancy (NILM)   Final   Microorganisms 08/29/2022 Shift in flora suggestive of bacterial vaginosis   Final   Comment 08/29/2022 Normal Reference Range HPV -  Negative   Final   Comment 08/29/2022 Normal Reference Range Trichomonas - Negative   Final   Comment 08/29/2022 Normal Reference Ranger Chlamydia - Negative   Final   Comment 08/29/2022 Normal Reference Range Neisseria Gonorrhea - Negative   Final   RPR Ser Ql 08/29/2022 NON-REACTIVE  NON-REACTIVE Final   HIV 1&2 Ab, 4th Generation 08/29/2022 NON-REACTIVE  NON-REACTIVE Final   Hepatitis C Ab 08/29/2022 NON-REACTIVE  NON-REACTIVE Final  Office Visit on 08/03/2022  Component Date Value Ref Range Status   SARS Coronavirus 2 Ag 08/03/2022 Negative  Negative Final   Influenza A, POC 08/03/2022 Negative  Negative Final   Influenza B, POC 08/03/2022 Negative  Negative Final   Rapid Strep A Screen 08/03/2022 Negative  Negative Final   Color, Urine 08/03/2022 ORANGE (A)  Yellow;Lt. Yellow;Straw;Dark Yellow;Amber;Green;Red;Brown Final   APPearance 08/03/2022 SL CLOUDY (A)  Clear;Turbid;Slightly Cloudy;Cloudy Final   Specific Gravity, Urine 08/03/2022 1.015  1.000 - 1.030 Final   pH 08/03/2022 8.0  5.0 - 8.0 Final   Total Protein, Urine 08/03/2022 30 (A)  Negative Final   Urine Glucose 08/03/2022 NEGATIVE  Negative Final   Ketones, ur 08/03/2022 15 (A)  Negative Final   Bilirubin Urine 08/03/2022 SMALL (A)  Negative Final   Hgb  urine dipstick 08/03/2022 NEGATIVE  Negative Final   Urobilinogen, UA 08/03/2022 2.0 (A)  0.0 - 1.0 Final   Leukocytes,Ua 08/03/2022 NEGATIVE  Negative Final   Nitrite 08/03/2022 NEGATIVE  Negative Final   WBC, UA 08/03/2022 0-2/hpf  0-2/hpf Final   RBC / HPF 08/03/2022 0-2/hpf  0-2/hpf Final   Squamous Epithelial / HPF 08/03/2022 Many(>10/hpf) (A)  Rare(0-4/hpf) Final   Bacteria, UA 08/03/2022 Few(10-50/hpf) (A)  None Final   Epithelial Casts, UA 08/03/2022 Presence of (A)  None Final   Lipase 08/03/2022 6 (L)  7 - 60 U/L Final   Glucose, Bld 08/03/2022 114 (H)  65 - 99 mg/dL Final   BUN 98/87/7975 4 (L)  7 - 25 mg/dL Final   Creat 98/87/7975 0.74  0.50 - 0.96 mg/dL Final   BUN/Creatinine Ratio 08/03/2022 5 (L)  6 - 22 (calc) Final   Sodium 08/03/2022 135  135 - 146 mmol/L Final   Potassium 08/03/2022 3.4 (L)  3.5 - 5.3 mmol/L Final   Chloride 08/03/2022 97 (L)  98 - 110 mmol/L Final   CO2 08/03/2022 18 (L)  20 - 32 mmol/L Final   Calcium  08/03/2022 9.1  8.6 - 10.2 mg/dL Final   Total Protein 98/87/7975 7.9  6.1 - 8.1 g/dL Final   Albumin 98/87/7975 4.5  3.6 - 5.1 g/dL Final   Globulin 98/87/7975 3.4  1.9 - 3.7 g/dL (calc) Final   AG Ratio 08/03/2022 1.3  1.0 - 2.5 (calc) Final   Total Bilirubin 08/03/2022 0.7  0.2 - 1.2 mg/dL Final   Alkaline phosphatase (APISO) 08/03/2022 69  31 - 125 U/L Final   AST 08/03/2022 21  10 - 30 U/L Final   ALT 08/03/2022 18  6 - 29 U/L Final   WBC 08/03/2022 12.4 (H)  3.8 - 10.8 Thousand/uL Final   RBC 08/03/2022 4.09  3.80 - 5.10 Million/uL Final   Hemoglobin 08/03/2022 13.7  11.7 - 15.5 g/dL Final   HCT 98/87/7975 39.2  35.0 - 45.0 % Final   MCV 08/03/2022 95.8  80.0 - 100.0 fL Final   MCH 08/03/2022 33.5 (H)  27.0 - 33.0 pg Final   MCHC 08/03/2022 34.9  32.0 - 36.0 g/dL Final  RDW 08/03/2022 15.6 (H)  11.0 - 15.0 % Final   Platelets 08/03/2022 285  140 - 400 Thousand/uL Final   MPV 08/03/2022 10.8  7.5 - 12.5 fL Final   Amylase 08/03/2022 66   21 - 101 U/L Final  Admission on 06/26/2022, Discharged on 06/29/2022  Component Date Value Ref Range Status   WBC 06/26/2022 7.0  4.0 - 10.5 K/uL Final   RBC 06/26/2022 3.53 (L)  3.87 - 5.11 MIL/uL Final   Hemoglobin 06/26/2022 12.8  12.0 - 15.0 g/dL Final   HCT 87/94/7976 36.6  36.0 - 46.0 % Final   MCV 06/26/2022 103.7 (H)  80.0 - 100.0 fL Final   MCH 06/26/2022 36.3 (H)  26.0 - 34.0 pg Final   MCHC 06/26/2022 35.0  30.0 - 36.0 g/dL Final   RDW 87/94/7976 17.6 (H)  11.5 - 15.5 % Final   Platelets 06/26/2022 203  150 - 400 K/uL Final   nRBC 06/26/2022 0.6 (H)  0.0 - 0.2 % Final   Sodium 06/26/2022 136  135 - 145 mmol/L Final   Potassium 06/26/2022 2.9 (L)  3.5 - 5.1 mmol/L Final   Chloride 06/26/2022 94 (L)  98 - 111 mmol/L Final   CO2 06/26/2022 24  22 - 32 mmol/L Final   Glucose, Bld 06/26/2022 164 (H)  70 - 99 mg/dL Final   BUN 87/94/7976 6  6 - 20 mg/dL Final   Creatinine, Ser 06/26/2022 0.77  0.44 - 1.00 mg/dL Final   Calcium  06/26/2022 9.0  8.9 - 10.3 mg/dL Final   Total Protein 87/94/7976 7.3  6.5 - 8.1 g/dL Final   Albumin 87/94/7976 4.3  3.5 - 5.0 g/dL Final   AST 87/94/7976 65 (H)  15 - 41 U/L Final   ALT 06/26/2022 30  0 - 44 U/L Final   Alkaline Phosphatase 06/26/2022 86  38 - 126 U/L Final   Total Bilirubin 06/26/2022 1.5 (H)  0.3 - 1.2 mg/dL Final   GFR, Estimated 06/26/2022 >60  >60 mL/min Final   Anion gap 06/26/2022 18 (H)  5 - 15 Final   Color, Urine 06/27/2022 AMBER (A)  YELLOW Final   APPearance 06/27/2022 HAZY (A)  CLEAR Final   Specific Gravity, Urine 06/27/2022 1.024  1.005 - 1.030 Final   pH 06/27/2022 7.0  5.0 - 8.0 Final   Glucose, UA 06/27/2022 NEGATIVE  NEGATIVE mg/dL Final   Hgb urine dipstick 06/27/2022 NEGATIVE  NEGATIVE Final   Bilirubin Urine 06/27/2022 SMALL (A)  NEGATIVE Final   Ketones, ur 06/27/2022 20 (A)  NEGATIVE mg/dL Final   Protein, ur 87/93/7976 100 (A)  NEGATIVE mg/dL Final   Nitrite 87/93/7976 NEGATIVE  NEGATIVE Final    Leukocytes,Ua 06/27/2022 NEGATIVE  NEGATIVE Final   RBC / HPF 06/27/2022 0-5  0 - 5 RBC/hpf Final   WBC, UA 06/27/2022 0-5  0 - 5 WBC/hpf Final   Bacteria, UA 06/27/2022 NONE SEEN  NONE SEEN Final   Squamous Epithelial / HPF 06/27/2022 6-10  0 - 5 Final   Mucus 06/27/2022 PRESENT   Final   Hyaline Casts, UA 06/27/2022 PRESENT   Final   Preg Test, Ur 06/27/2022 NEGATIVE  NEGATIVE Final   Alcohol, Ethyl (B) 06/26/2022 <10  <10 mg/dL Final   Lactic Acid, Venous 06/26/2022 1.5  0.5 - 1.9 mmol/L Final   Opiates 06/27/2022 NONE DETECTED  NONE DETECTED Final   Cocaine 06/27/2022 NONE DETECTED  NONE DETECTED Final   Benzodiazepines 06/27/2022 POSITIVE (A)  NONE DETECTED Final  Amphetamines 06/27/2022 NONE DETECTED  NONE DETECTED Final   Tetrahydrocannabinol 06/27/2022 POSITIVE (A)  NONE DETECTED Final   Barbiturates 06/27/2022 NONE DETECTED  NONE DETECTED Final   HIV Screen 4th Generation wRfx 06/27/2022 Non Reactive  Non Reactive Final   Magnesium  06/27/2022 1.5 (L)  1.7 - 2.4 mg/dL Final   Phosphorus 87/93/7976 3.2  2.5 - 4.6 mg/dL Final   WBC 87/93/7976 7.2  4.0 - 10.5 K/uL Final   RBC 06/27/2022 3.27 (L)  3.87 - 5.11 MIL/uL Final   Hemoglobin 06/27/2022 11.8 (L)  12.0 - 15.0 g/dL Final   HCT 87/93/7976 34.0 (L)  36.0 - 46.0 % Final   MCV 06/27/2022 104.0 (H)  80.0 - 100.0 fL Final   MCH 06/27/2022 36.1 (H)  26.0 - 34.0 pg Final   MCHC 06/27/2022 34.7  30.0 - 36.0 g/dL Final   RDW 87/93/7976 17.8 (H)  11.5 - 15.5 % Final   Platelets 06/27/2022 186  150 - 400 K/uL Final   nRBC 06/27/2022 0.8 (H)  0.0 - 0.2 % Final   Sodium 06/27/2022 135  135 - 145 mmol/L Final   Potassium 06/27/2022 3.3 (L)  3.5 - 5.1 mmol/L Final   Chloride 06/27/2022 94 (L)  98 - 111 mmol/L Final   CO2 06/27/2022 26  22 - 32 mmol/L Final   Glucose, Bld 06/27/2022 118 (H)  70 - 99 mg/dL Final   BUN 87/93/7976 6  6 - 20 mg/dL Final   Creatinine, Ser 06/27/2022 0.77  0.44 - 1.00 mg/dL Final   Calcium  06/27/2022 8.7 (L)   8.9 - 10.3 mg/dL Final   Total Protein 87/93/7976 6.7  6.5 - 8.1 g/dL Final   Albumin 87/93/7976 4.2  3.5 - 5.0 g/dL Final   AST 87/93/7976 46 (H)  15 - 41 U/L Final   ALT 06/27/2022 25  0 - 44 U/L Final   Alkaline Phosphatase 06/27/2022 76  38 - 126 U/L Final   Total Bilirubin 06/27/2022 1.3 (H)  0.3 - 1.2 mg/dL Final   GFR, Estimated 06/27/2022 >60  >60 mL/min Final   Anion gap 06/27/2022 15  5 - 15 Final   Sodium 06/28/2022 137  135 - 145 mmol/L Final   Potassium 06/28/2022 2.5 (LL)  3.5 - 5.1 mmol/L Final   Chloride 06/28/2022 99  98 - 111 mmol/L Final   CO2 06/28/2022 26  22 - 32 mmol/L Final   Glucose, Bld 06/28/2022 93  70 - 99 mg/dL Final   BUN 87/92/7976 <5 (L)  6 - 20 mg/dL Final   Creatinine, Ser 06/28/2022 0.71  0.44 - 1.00 mg/dL Final   Calcium  06/28/2022 9.2  8.9 - 10.3 mg/dL Final   Total Protein 87/92/7976 6.6  6.5 - 8.1 g/dL Final   Albumin 87/92/7976 4.0  3.5 - 5.0 g/dL Final   AST 87/92/7976 51 (H)  15 - 41 U/L Final   ALT 06/28/2022 26  0 - 44 U/L Final   Alkaline Phosphatase 06/28/2022 72  38 - 126 U/L Final   Total Bilirubin 06/28/2022 1.2  0.3 - 1.2 mg/dL Final   GFR, Estimated 06/28/2022 >60  >60 mL/min Final   Anion gap 06/28/2022 12  5 - 15 Final   WBC 06/28/2022 5.8  4.0 - 10.5 K/uL Final   RBC 06/28/2022 3.47 (L)  3.87 - 5.11 MIL/uL Final   Hemoglobin 06/28/2022 12.6  12.0 - 15.0 g/dL Final   HCT 87/92/7976 36.6  36.0 - 46.0 % Final   MCV  06/28/2022 105.5 (H)  80.0 - 100.0 fL Final   MCH 06/28/2022 36.3 (H)  26.0 - 34.0 pg Final   MCHC 06/28/2022 34.4  30.0 - 36.0 g/dL Final   RDW 87/92/7976 18.1 (H)  11.5 - 15.5 % Final   Platelets 06/28/2022 133 (L)  150 - 400 K/uL Final   nRBC 06/28/2022 0.7 (H)  0.0 - 0.2 % Final   Magnesium  06/28/2022 2.0  1.7 - 2.4 mg/dL Final   Sodium 87/92/7976 138  135 - 145 mmol/L Final   Potassium 06/28/2022 5.8 (H)  3.5 - 5.1 mmol/L Final   Chloride 06/28/2022 107  98 - 111 mmol/L Final   CO2 06/28/2022 19 (L)  22 - 32  mmol/L Final   Glucose, Bld 06/28/2022 99  70 - 99 mg/dL Final   BUN 87/92/7976 <5 (L)  6 - 20 mg/dL Final   Creatinine, Ser 06/28/2022 1.22 (H)  0.44 - 1.00 mg/dL Final   Calcium  06/28/2022 9.5  8.9 - 10.3 mg/dL Final   GFR, Estimated 06/28/2022 >60  >60 mL/min Final   Anion gap 06/28/2022 12  5 - 15 Final   Sodium 06/29/2022 139  135 - 145 mmol/L Final   Potassium 06/29/2022 3.6  3.5 - 5.1 mmol/L Final   Chloride 06/29/2022 105  98 - 111 mmol/L Final   CO2 06/29/2022 22  22 - 32 mmol/L Final   Glucose, Bld 06/29/2022 88  70 - 99 mg/dL Final   BUN 87/91/7976 <5 (L)  6 - 20 mg/dL Final   Creatinine, Ser 06/29/2022 0.69  0.44 - 1.00 mg/dL Final   Calcium  06/29/2022 9.2  8.9 - 10.3 mg/dL Final   GFR, Estimated 06/29/2022 >60  >60 mL/min Final   Anion gap 06/29/2022 12  5 - 15 Final   Magnesium  06/29/2022 1.7  1.7 - 2.4 mg/dL Final   TSH 87/91/7976 5.813 (H)  0.350 - 4.500 uIU/mL Final  There may be more visits with results that are not included.  No image results found. US  OB Limited Result Date: 02/13/2024 ----------------------------------------------------------------------  OBSTETRICS REPORT                       (Signed Final 02/13/2024 10:09 am) ---------------------------------------------------------------------- Patient Info  ID #:       [id number]                          D.O.B.:  [birth year] (28 yrs)(F)  Name:       RICHERD ANN                    Visit Date: 02/12/2024 02:26 pm              [facility]  ---------------------------------------------------------------------- Performed By  Attending:        ROLLO BRING          Ref. Address:     [address]                    [place]                                                             [address]  [place], [place]                                                             [address]  Performed By:     Rocky Ober RN        [facility]         [facility]                                                              [facility]  Referred By:      [facility] ---------------------------------------------------------------------- Orders  #  Description                           Code        Ordered By  1  US  OB LIMITED                         23184.9     ROLLO BRING ----------------------------------------------------------------------  #  Order #                     Accession #                Episode #  1  [id number]                   [id number]                 [id number] ---------------------------------------------------------------------- Indications  [redacted] weeks gestation of pregnancy                Z3A.10 ---------------------------------------------------------------------- Fetal Evaluation  Num Of Fetuses:         1  Preg. Location:         Intrauterine  Gest. Sac:              Intrauterine  Yolk Sac:               Not visualized  Fetal Pole:             Visualized  Fetal Heart Rate(bpm):  157  Cardiac Activity:       Observed ---------------------------------------------------------------------- Biometry  CRL:      39.9  mm     G. Age:  10w 5d  EDD:   09/04/24 ---------------------------------------------------------------------- OB History  Gravidity:    2         Term:   0        Prem:   0        SAB:   0  TOP:          1       Ectopic:  0        Living: 0 ---------------------------------------------------------------------- Gestational Age  Best:          10w 5d     Det. By:  Early Ultrasound         EDD:   09/04/24                                      (02/12/24) ---------------------------------------------------------------------- Comments  Single live IUP at [redacted]w[redacted]d by CRL. LMP unknown (?end of  April 2025). EDD of 09/04/24 to be assigned based on today's  scan. US  technically limited do to maternal body habitus.  ---------------------------------------------------------------------- Impression  Single viable intrauterine pregnancy at [redacted]w[redacted]d with EDD  09/04/2024 ----------------------------------------------------------------------             ROLLO BRING, MD Electronically Signed Final Report   02/13/2024 10:09 am ----------------------------------------------------------------------         ASSESSMENT & PLAN   Assessment & Plan High-risk pregnancy in second trimester Pregnancy, [redacted] weeks gestation   Ultrasound confirms [redacted] weeks gestation. There is a 6% risk of trisomy 13 due to low fetal fraction, causing anxiety. However, there is a 94% chance that everything is fine. Repeat ultrasound on August 7 to reassess fetal fraction and evaluate for trisomy 33. Encourage mindfulness techniques and provide a handout for anxiety management. Other headache syndrome Migraine with withdrawal headache due to Auvelity  discontinuation   She experiences a severe bitemporal and forehead headache for one week, likely from Auvelity  withdrawal. Tylenol  and Excedrin are ineffective, and Maxalt  is not recommended during pregnancy. AI-assisted decision advises against resuming Auvelity  due to pregnancy risks. Prescribe Reglan  with Benadryl  for headache management and Imitrex  as a backup if Reglan  fails. GAD (generalized anxiety disorder) Generalized anxiety disorder   Anxiety has increased due to pregnancy concerns and Auvelity  discontinuation. Seroquel  is recommended as a safer alternative for managing anxiety during pregnancy, while Lexapro  should continue for mental health needs. Encourage Seroquel  use during stress episodes and continue Lexapro , balancing mental health needs against potential risks. Provide a mindfulness-based handout for anxiety management.Major depressive disorder is in remission. Continue Lexapro  to maintain mental health stability. Nicotine  dependence due to vaping tobacco product She continues vaping  despite potential pregnancy risks. Encourage switching to zyn as a safer alternative to reduce exposure to heavy metals.  ORDER ASSOCIATIONS  #   DIAGNOSIS / CONDITION ICD-10 ENCOUNTER ORDER     ICD-10-CM   1. High-risk pregnancy in second trimester  O09.92     2. Other headache syndrome  G44.89 metoCLOPramide  (REGLAN ) 5 MG tablet    diphenhydrAMINE  (BENADRYL ) 25 mg capsule    SUMAtriptan  (IMITREX ) 50 MG tablet    3. GAD (generalized anxiety disorder)  F41.1     4. Nicotine  dependence due to vaping tobacco product  F17.290      Meds ordered this encounter  Medications   metoCLOPramide  (REGLAN ) 5 MG tablet    Sig: Take 1 tablet (5 mg total) by mouth every 6 (six) hours as needed for nausea.  Dispense:  30 tablet    Refill:  1   diphenhydrAMINE  (BENADRYL ) 25 mg capsule    Sig: Take 1 capsule (25 mg total) by mouth every 6 (six) hours as needed.    Dispense:  30 capsule    Refill:  0   SUMAtriptan  (IMITREX ) 50 MG tablet    Sig: Take 1 tablet (50 mg total) by mouth daily as needed for migraine. May repeat in 2 hours if headache persists or recurs.    Dispense:  10 tablet    Refill:  5      This document was synthesized by artificial intelligence (Abridge) using HIPAA-compliant recording of the clinical interaction;   We discussed the use of AI scribe software for clinical note transcription with the patient, who gave verbal consent to proceed. additional Info: This encounter employed state-of-the-art, real-time, collaborative documentation. The patient actively reviewed and assisted in updating their electronic medical record on a shared screen, ensuring transparency and facilitating joint problem-solving for the problem list, overview, and plan. This approach promotes accurate, informed care. The treatment plan was discussed and reviewed in detail, including medication safety, potential side effects, and all patient questions. We confirmed understanding and comfort with the plan.  Follow-up instructions were established, including contacting the office for any concerns, returning if symptoms worsen, persist, or new symptoms develop, and precautions for potential emergency department visits.

## 2024-02-21 NOTE — Patient Instructions (Addendum)
 Auvelity  and rizatriptan  should be held. For acute headache, acetaminophen  is first-line; if ineffective, metoclopramide  with or without diphenhydramine  is recommended. Sumatriptan may be considered if needed. Non-pharmacologic strategies and continuation of propranolol  for prevention are appropriate.  Mindfulness for Pregnancy Anxiety  Feeling Anxious During Pregnancy: How Mindfulness Can Help  Pregnancy can bring many emotions, especially if there are changes in your medications or concerns about your baby's health, like a higher risk for certain conditions. It's normal to feel worried or anxious, but there are healthy ways to cope. Mindfulness is a proven, safe way to help manage anxiety and stress during pregnancy.[1][2][3][4][5][6][7][8][9][10][11][12]  What is Mindfulness?  Mindfulness means paying attention to the present moment, on purpose, and without judging yourself. It helps you notice your thoughts and feelings without getting overwhelmed by them. Practicing mindfulness can help you feel calmer, more in control, and more connected to your baby.[2][3][6][10][11]  How Can Mindfulness Help During Pregnancy?  - Reduces anxiety and stress, even when facing difficult news or changes in your health.[1][2][3][4][5][6][7][8][9][10][11][12]  - Improves your mood and emotional well-being.[3][6][8][11][12]  - Helps you feel more connected to your baby.[3][6][9]  - Can be done safely at home, in groups, or with digital programs.[3][4][5][7][8]  Simple Mindfulness Practices to Try  1. Mindful Breathing  - Sit or lie down comfortably.  - Close your eyes if you like.  - Take a slow, deep breath in through your nose.  - Notice the feeling of the air moving in and out.  - If your mind wanders, gently bring your focus back to your breath.  - Try this for 3-5 minutes, once or twice a day.  2. Body Scan  - Lie down or sit comfortably.  - Slowly bring your attention to each part of  your body, starting at your toes and moving up to your head.  - Notice any sensations, tension, or relaxation.  - If you notice worry or discomfort, try to accept it without judgment, and gently return your focus to your body.[2][9]  3. Mindful Moments with Your Baby  - Place your hands on your belly.  - Notice your breath and any movements from your baby.  - Imagine sending calm and loving thoughts to your baby.  - This can help you feel more connected and reduce anxiety.[3][6][9]  4. Acceptance Practice  - When you notice a difficult thought or feeling, pause and acknowledge it.  - Remind yourself that it's okay to feel this way.  - Try saying to yourself, This is a tough moment, but I can handle it.  - Acceptance is a key part of mindfulness that helps reduce anxiety.[2]  Tips for Getting Started  - Try to practice mindfulness every day, even for a few minutes.  - Use free apps or online programs designed for pregnant women.[3][5][8]  - Join a group or ask your healthcare team about mindfulness classes.  - Be patient with yourself--mindfulness is a skill that gets easier with practice.  When to Seek More Help  If your anxiety feels overwhelming or you have trouble coping, talk to your healthcare provider. Mindfulness can be a helpful tool, but sometimes extra support is needed.  Remember: Mindfulness is safe during pregnancy and can help you manage anxiety, especially during times of uncertainty. Practicing acceptance and being kind to yourself are important parts of this process.[2][6][10][11][12] References The Effectiveness of Mindfulness-Based Interventions in the Perinatal Period: A Systematic Review and Meta-Analysis. Lever Waddell KATHEE Audelia [name] Lorie KYM PloS One.  2016;11(5):e0155720. doi:10.1371/journal.pone.9844279. Emphasizing Mindfulness Training in Acceptance Relieves Anxiety and Depression During Pregnancy. Diona CHRISTELLA Zelphia OLEGARIO Prentiss JAYSON, et al. Psychiatry  Research. 2022;312:114540. doi:10.1016/j.psychres.7977.885459. Development and Evaluation of a Mindfulness-Based Mobile Intervention for Perinatal Mental Health: Randomized Controlled Trial. Park S, Cho HY, [place], [place], Jhung K. Journal of [employer]. 2025;27:e56601. doi:10.2196/56601. Using an Electronic Mindfulness-Based Intervention (eMBI) to Improve Maternal Mental Health During Pregnancy: Results From a Randomized Controlled Trial. Hassdenteufel K, Mller M, Rhae DEL, et al. Psychiatry Research. 7976;669:884400. doi:10.1016/j.psychres.7976.884400. Online Mindfulness-Based Intervention for Women With Pregnancy Distress: A Randomized Controlled Trial. Hulsbosch LP, Potharst ES, Schwabe I, et al. Journal of Affective Disorders. 2023;332:262-272. doi:10.1016/j.jad.2023.04.009. The Effects of the Mindfulness-Based Childbirth and Parenting Program (MBCP) on Prenatal Attachment, Depression, Stress, and Anxiety in Pregnant Women: A Randomized Controlled Trial. F?nd?k E, Y?lmaz Sezer N, Aker MN, Badur D. Journal of Affective Disorders. [phone]. doi:10.1016/j.jad.2025.02.030. Improving Maternal Mental Health and Weight Control With a Mindfulness Blended Care Approach: Insights From a Randomized Controlled Trial. Hassdenteufel K, Mller M, Abele H, et al. Journal of Medical Internet Research. 2025;27:e56230. doi:10.2196/56230. Effectiveness of Digital Guided Self-Help Mindfulness Training During Pregnancy on Maternal Psychological Distress and Infant Neuropsychological Development: Randomized Controlled Trial. Laurita OLEGARIO, Lucillie CINDERELLA Cesario JINNY, et al. Journal of Medical Internet Research. 2023;25:e41298. doi:10.2196/41298. Effects of Mediterranean Diet or Mindfulness-Based Stress Reduction on Prevention of Small-for-Gestational Age Birth Weights in Newborns Born to At-Risk Pregnant Individuals: The IMPACT BCN Randomized Clinical Trial. Crovetto F, Crispi F, Casas R, et al. JAMA.  2021;326(21):2150-2160. doi:10.1001/jama.7978.79821. The Effect of Mindfulness-Based Counselling on the Anxiety Levels and Childbirth Satisfaction Among Primiparous Pregnant Women: A Randomized Controlled Trial. Rainelle R, Heydarpour S, Zina [name] Karie PHEBE [facility] Psychiatry. 2024;24(1):964. doi:10.1186/s12888-024-06442-3. Effects of a Prenatal Mindfulness Program on Longitudinal Changes in Stress, Anxiety, Depression, and Mother-Infant Bonding of Women With a Tendency to Perinatal Mood and Anxiety Disorder: A Randomized Controlled Trial. Pan WL, Melia DEAR, Jonda ALICE, Chiu MJ, Ling PY. [facility] Pregnancy and Childbirth. 2023;23(1):547. doi:10.1186/s12884-023-05873-2. Mindfulness-Based Intervention for Clinical and Subthreshold Perinatal Depression and Anxiety: A Systematic Review and Meta-Analysis of Randomized Controlled Trial. Lucienne GARIBALDI, Erin PUMA, Ng SM. Comprehensive Psychiatry. 2023;122:152375. https://[email or web address].[email or web address].

## 2024-02-21 NOTE — Telephone Encounter (Signed)
 Copied from CRM [id number]. Topic: Clinical - Prescription Issue >> Feb 21, 2024 11:52 AM Eleanor C wrote: Reason for CRM: [facility] pharmacy called calling metoCLOPramide  (REGLAN ) 5 MG tablet, there is a drug interaction with QUEtiapine  (SEROQUEL ) 100 MG tablet phone that patient is currently taking. Pharmacist was wondering if there is a possible alternative prescription or what physician would like to do. If you would like to speak with pharmacy, phone number is [phone] or if needed fax [phone]  [facility] with pharmacy about medication per provider stated to do not fill reglan  and take her Imitrex pharmacy will call pt and let her know

## 2024-02-22 NOTE — Assessment & Plan Note (Signed)
 She continues vaping despite potential pregnancy risks. Encourage switching to zyn as a safer alternative to reduce exposure to heavy metals.

## 2024-02-22 NOTE — Assessment & Plan Note (Signed)
 Generalized anxiety disorder   Anxiety has increased due to pregnancy concerns and Auvelity  discontinuation. Seroquel  is recommended as a safer alternative for managing anxiety during pregnancy, while Lexapro  should continue for mental health needs. Encourage Seroquel  use during stress episodes and continue Lexapro , balancing mental health needs against potential risks. Provide a mindfulness-based handout for anxiety management.Major depressive disorder is in remission. Continue Lexapro  to maintain mental health stability.

## 2024-02-24 ENCOUNTER — Telehealth: Payer: Self-pay

## 2024-02-24 NOTE — Telephone Encounter (Signed)
 Pharmacy Patient Advocate Encounter   Received notification from CoverMyMeds that prior authorization for Auvelity  45-105MG  er tablets is required/requested.   Insurance verification completed.   The patient is insured through [facility] .   Medication has been discontinued. Archived Key: [id number]

## 2024-02-27 ENCOUNTER — Encounter: Payer: Self-pay | Admitting: Physician Assistant

## 2024-02-27 ENCOUNTER — Ambulatory Visit: Admitting: Physician Assistant

## 2024-02-27 VITALS — BP 127/84 | HR 76 | Wt 194.2 lb

## 2024-02-27 DIAGNOSIS — G43809 Other migraine, not intractable, without status migrainosus: Secondary | ICD-10-CM

## 2024-02-27 DIAGNOSIS — J4520 Mild intermittent asthma, uncomplicated: Secondary | ICD-10-CM | POA: Diagnosis not present

## 2024-02-27 DIAGNOSIS — Z3A12 12 weeks gestation of pregnancy: Secondary | ICD-10-CM

## 2024-02-27 DIAGNOSIS — Z87898 Personal history of other specified conditions: Secondary | ICD-10-CM

## 2024-02-27 DIAGNOSIS — F411 Generalized anxiety disorder: Secondary | ICD-10-CM

## 2024-02-27 DIAGNOSIS — O99341 Other mental disorders complicating pregnancy, first trimester: Secondary | ICD-10-CM

## 2024-02-27 DIAGNOSIS — F331 Major depressive disorder, recurrent, moderate: Secondary | ICD-10-CM

## 2024-02-27 DIAGNOSIS — O0990 Supervision of high risk pregnancy, unspecified, unspecified trimester: Secondary | ICD-10-CM

## 2024-02-27 DIAGNOSIS — O0991 Supervision of high risk pregnancy, unspecified, first trimester: Secondary | ICD-10-CM | POA: Diagnosis not present

## 2024-02-27 DIAGNOSIS — F17290 Nicotine dependence, other tobacco product, uncomplicated: Secondary | ICD-10-CM

## 2024-02-27 NOTE — Progress Notes (Signed)
 PRENATAL VISIT NOTE  Subjective:  Natasha Pope  is a 28 y.o. G2P0010 at 108w6d being seen today for ongoing prenatal care.  She is currently monitored for the following issues for this high-risk pregnancy and has Nicotine  dependence due to vaping tobacco product; Asthma; GAD (generalized anxiety disorder); ASCUS of cervix with negative high risk HPV; Macrocytosis; Substance induced mood disorder (HCC); Alcohol use with alcohol-induced mood disorder (HCC); Alcohol use disorder, severe, in early remission, dependence (HCC); Recurrent major depressive disorder (HCC); Panic disorder with agoraphobia; Delusional disorder (HCC); Auditory hallucinations; Visual hallucination; Chronic abdominal pain; PTSD (post-traumatic stress disorder); Memory loss; Snoring; Obesity (BMI 30-39.9); Financial difficulties; Contraceptive management; GERD (gastroesophageal reflux disease); Migraine; Nausea; History of alcohol use disorder; Cannabis use with anxiety disorder (HCC); Parasomnia; Pruritic dermatitis; and Supervision of high risk pregnancy, antepartum on their problem list.  Patient reports no complaints. Last alcohol use two weeks ago.  Contractions: Not present. Vag. Bleeding: None.  Movement: Present. Denies leaking of fluid.   The following portions of the patient's history were reviewed and updated as appropriate: allergies, current medications, past family history, past medical history, past social history, past surgical history and problem list.   Objective:    Vitals:   02/27/24 1130  BP: 127/84  Pulse: 76  Weight: 194 lb 3.2 oz (88.1 kg)    Fetal Status:  Fetal Heart Rate (bpm): 159 Fundal Height: 12 cm Movement: Present    General: Alert, oriented and cooperative. Patient is in no acute distress.  Skin: Skin is warm and dry. No rash noted.   Cardiovascular: Normal heart rate noted  Respiratory: Normal respiratory effort, no problems with respiration noted  Abdomen: Soft, gravid,  appropriate for gestational age.  Pain/Pressure: Absent     Pelvic: Cervical exam deferred        Extremities: Normal range of motion.  Edema: None  Mental Status: Normal mood and affect. Normal behavior. Normal judgment and thought content.   Assessment and Plan:  Pregnancy: G2P0010 at [redacted]w[redacted]d  1. Supervision of high risk pregnancy, antepartum (Primary) Initial labs drawn. Continue prenatal vitamins. Genetic Screening discussed: NIPS, carrier screening and AFP Ultrasound discussed; fetal anatomic survey: ordered 02/11/22 A1c 5.1  Problem list reviewed and updated. Reviewed Brx optimized schedule, patient agreeable The nature of [facility] with multiple MDs and other Advanced Practice Providers was explained to patient; also emphasized that residents, students are part of our team. Routine obstetric precautions reviewed.  - PANORAMA PRENATAL TEST  2. [redacted] weeks gestation of pregnancy Anticipatory guidance about next visits/weeks of pregnancy given.   3. Other migraine without status migrainosus, not intractable Suboptimally controlled on Imitrex  50 mg  - Ambulatory referral to Neurology  4. Moderate episode of recurrent major depressive disorder (HCC) 5. GAD (generalized anxiety disorder) Stable Quetiapine  100 mg, Buspar  15 mg, Lexapro  20 mg managed by PCP  6. History of alcohol use disorder 7. Nicotine  dependence due to vaping tobacco product Would like to continue smoking cessation management with PCP, next appointment tomorrow. Last alcohol use two weeks ago. Placed request for [facility] clinic appointment.   Preterm labor symptoms and general obstetric precautions including but not limited to vaginal bleeding, contractions, leaking of fluid and fetal movement were reviewed in detail with the patient.  Please refer to After Visit Summary for other counseling recommendations.   Return in about 4 weeks (around 03/26/2024) [facility].  Future  Appointments  Date Time Provider Department Center  02/28/2024  4:00  PM Jesus Bernardino MATSU, MD [facility]  03/26/2024 11:15 AM Solene Hereford E, PA-C [facility] None    Celso Granja E Jori Thrall, PA-C

## 2024-02-27 NOTE — Progress Notes (Signed)
 Pt presents for NOB visit. Pt c/o migraines not alleviated with any medications.

## 2024-02-28 ENCOUNTER — Ambulatory Visit: Admitting: Internal Medicine

## 2024-03-09 ENCOUNTER — Ambulatory Visit: Admitting: Internal Medicine

## 2024-03-09 ENCOUNTER — Ambulatory Visit: Payer: Self-pay

## 2024-03-09 NOTE — Telephone Encounter (Signed)
 Copied from CRM #[id number]. Topic: Clinical - Prescription Issue >> Mar 09, 2024  1:09 PM [place] J wrote: Reason for CRM: Patient is saying that her SUMAtriptan  (IMITREX ) 50 MG tablet is not working for her migraines. She is scheduled for an appointment 09/03. Answer Assessment - Initial Assessment Questions Pt is pregnant and was very bothered by triage call. No triage given in regards to complaint of worsening headaches and Imitrex  not working. Pt has soonest appt on 8/20 with Job  Protocols used: [facility]

## 2024-03-09 NOTE — Telephone Encounter (Signed)
 Dr. Jesus, please see message and advise. Pt is scheduled to see Samantha on 8/20.

## 2024-03-10 ENCOUNTER — Ambulatory Visit: Payer: Self-pay | Admitting: Physician Assistant

## 2024-03-10 LAB — PANORAMA PRENATAL TEST FULL PANEL:PANORAMA TEST PLUS 5 ADDITIONAL MICRODELETIONS: FETAL FRACTION: 2.8

## 2024-03-11 ENCOUNTER — Encounter: Payer: Self-pay | Admitting: Physician Assistant

## 2024-03-11 ENCOUNTER — Ambulatory Visit: Admitting: Physician Assistant

## 2024-03-11 DIAGNOSIS — G4489 Other headache syndrome: Secondary | ICD-10-CM

## 2024-03-11 MED ORDER — METOCLOPRAMIDE HCL 5 MG PO TABS: 5.0000 mg | ORAL_TABLET | Freq: Four times a day (QID) | ORAL | 1 refills | Status: DC | PRN

## 2024-03-11 NOTE — Progress Notes (Signed)
 Natasha Pope  is a 28 y.o. female here for a follow up of a pre-existing problem.  History of Present Illness:   Chief Complaint  Patient presents with   Migraine    Medication not working     Discussed the use of AI scribe software for clinical note transcription with the patient, who gave verbal consent to proceed.  History of Present Illness Natasha Pope  is a 28 year old female who presents with persistent migraines. She is currently [redacted] weeks pregnant and reports she is UpToDate on prenatal care.  She experiences severe migraines with a constant, banging sensation across her entire head. Current medications, including Imitrex , are ineffective. There are no associated vision changes, vomiting, numbness, tingling, slurred speech, or balance issues.  She takes propranolol  60 mg extended release daily, along with Buspar  and Lexapro  daily. Quetiapine  is used rarely at night due to concerns about morning grogginess. A prescription for Reglan  5 mg with Benadryl  was not filled due to potential interactions with quetiapine .  Her family history includes migraines in her mother and grandmother. She is pregnant and maintains prenatal care with vitamins, nutrition, and hydration.  She recently started a new job requiring early mornings, impacting her ability to manage her symptoms effectively. She is frustrated with ongoing headache management and prescription issues.    Past Medical History:  Diagnosis Date   Anxiety    Asthma    Depression    Gastroenteritis 10/22/2022   Nausea, vomiting, and diarrhea x 3 days Secondary dehydration-> dizziness, low blood pressure, dry tongue Bad pain bilateral headache(s) started yesterday, likely from dehydration.  No abdomen pain. No fevers Patient denies any other associated symptom(s)    History of alcohol withdrawal delirium 07/04/2022   We discussed her recent relapse and then withdrawal seizures and she felt 90% certain that  the return to alcohol use  was due to manic depression as the main trigger so I sent fyi to [employer] her psychiatrist to consider medication changes based on this, but don't intend to adjust regimen for that. 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 Suspect due to vitamin deficiency Recommended she take daily mvi and continue to abstain from EtOH   Nausea vomiting and diarrhea 06/27/2022   Just for past week or two.   Nicotine  dependence due to vaping tobacco product 01/31/2018   Has been smoking since she was 28 years old.    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   Technique we just overweight by BMI but she puts a lot of the weight on her abdomen so I am documenting this as obesity is at least truncal obesity therefore I am going to try to send in for We[employer]  and see if insurance will cover as not only would help weight loss but actually will reduce her desire to drink   PTSD (post-traumatic stress disorder) 03/19/2022   Pyelonephritis    Seizure-like activity (HCC)    Seizures (HCC)    Sepsis (HCC)    Snoring 04/17/2022   Thrombocytopenia (HCC) 05/19/2018   Lab Results  Component  Value  Date/Time     PLT  285  08/03/2022 02:36 PM     PLT  133 (L)  06/28/2022 06:11 AM     PLT  186  06/27/2022 05:13 AM     PLT  203  06/26/2022 06:52 PM     PLT  323  03/08/2022 12:07 PM     PLT  247  10/03/2020 04:03 PM         Unwanted pregnancy 11/26/2022   Withdrawal syndrome (HCC) 09/27/2022   Severe depression past 3 days out of proportion to triggers, suspicious for withdrawal from Auvelity  that she recently ran out of.  Also recent change of seroquel  considered but we went 50->100 and its helping her sleep and usually helps severe depression       Social History   Tobacco Use    Smoking status: Former    Current packs/day: 0.00    Types: Cigarettes, E-cigarettes    Quit date: 05/10/2018    Years since quitting: 5.8   Smokeless tobacco: Never   Tobacco comments:    VAPES  Vaping Use   Vaping status: Every Day  Substance Use Topics   Alcohol use: Not Currently    Comment: 02/12/24, 1 mo sober   Drug use: Yes    Types: Marijuana    Past Surgical History:  Procedure Laterality Date   dental procedure      Family History  Problem Relation Age of Onset   Depression Mother    Miscarriages / Stillbirths Mother    Thyroid  disease Mother    Hypertension Father    Alcohol abuse Father    Learning disabilities Sister    Hyperlipidemia Maternal Grandmother    Alcohol abuse Maternal Grandfather    Alcohol abuse Paternal Grandmother    Hypertension Paternal Grandfather     Allergies  Allergen Reactions   Lamotrigine  Hives   Pineapple     Throat swelling and itching    Current Medications:   Current Outpatient Medications:    Blood Pressure Monitoring (BLOOD PRESSURE KIT) DEVI, 1 Device by Does not apply route once a week., Disp: 1 each, Rfl: 0   busPIRone  (BUSPAR ) 15 MG tablet, TAKE ONE TABLET BY MOUTH THREE TIMES DAILY (MUST BE TAKEN CONSISTENTLY), Disp: 270 tablet, Rfl: 0   diphenhydrAMINE  (BENADRYL ) 25 mg capsule, Take 1 capsule (25 mg total) by mouth every 6 (six) hours as needed., Disp: 30 capsule, Rfl: 0   escitalopram  (LEXAPRO ) 20 MG tablet, TAKE ONE TABLET BY MOUTH ONCE A DAY, Disp: 90 tablet, Rfl: 0   levocetirizine (XYZAL ) 5 MG tablet, Take 1 tablet (5 mg total) by mouth every evening., Disp: 90 tablet, Rfl: 3   metoCLOPramide  (REGLAN ) 5 MG tablet, Take 1 tablet (5 mg total) by mouth every 6 (six) hours as needed for nausea., Disp: 30 tablet, Rfl: 1   omeprazole  (PRILOSEC) 20 MG capsule, Take 1 capsule (20 mg total) by mouth daily., Disp: 30 capsule, Rfl: 11   Prenat-FeAsp-Meth-FA-DHA w/o A (PRENATE PIXIE ) 10-0.6-0.4-200 MG CAPS, Take 1 tablet  by mouth daily., Disp: 30 capsule, Rfl: 11   Prenatal Vit-Fe Fumarate-FA (PRENATAL VITAMINS PO), Take 1 tablet by mouth daily., Disp: , Rfl:    propranolol  ER (INDERAL  LA) 60 MG 24 hr capsule, Take 1 capsule (60 mg total) by mouth daily., Disp: 90 capsule, Rfl: 3   QUEtiapine  (SEROQUEL ) 100 MG tablet, TAKE ONE-HALF TABLET (50MG ) BY MOUTH ONCE DAILY IN THE MORNING AND ONE AND ONE-HALF TABLET (150MG ) DAILY IN THE EVENING *OK TO TAKE WITH ELEVATE*, Disp: 90 tablet, Rfl: 0   SUMAtriptan  (IMITREX ) 50 MG tablet, Take 1 tablet (50 mg total) by mouth daily as needed for  migraine. May repeat in 2 hours if headache persists or recurs., Disp: 10 tablet, Rfl: 5   Review of Systems:   Negative unless otherwise specified per HPI.  Vitals:   Vitals:   03/11/24 1114  BP: 110/71  Pulse: 77  TempSrc: Temporal  SpO2: 98%  Height: 5' 5 (1.651 m)     Body mass index is 32.32 kg/m.  Physical Exam:   Physical Exam Vitals and nursing note reviewed.  Constitutional:      General: She is not in acute distress.    Appearance: She is well-developed. She is not ill-appearing or toxic-appearing.  Cardiovascular:     Rate and Rhythm: Normal rate and regular rhythm.     Pulses: Normal pulses.     Heart sounds: Normal heart sounds, S1 normal and S2 normal.  Pulmonary:     Effort: Pulmonary effort is normal.     Breath sounds: Normal breath sounds.  Skin:    General: Skin is warm and dry.  Neurological:     General: No focal deficit present.     Mental Status: She is alert.     GCS: GCS eye subscore is 4. GCS verbal subscore is 5. GCS motor subscore is 6.     Cranial Nerves: Cranial nerves 2-12 are intact.     Sensory: Sensation is intact.     Motor: Motor function is intact.     Coordination: Coordination is intact.     Gait: Gait is intact.  Psychiatric:        Speech: Speech normal.        Behavior: Behavior normal. Behavior is cooperative.     Assessment and Plan:   Assessment and  Plan Assessment & Plan Migraine  Chronic migraine. Current treatment with Imitrex  ineffective. Propranolol  used for prevention. Previous recommendation for Reglan  with Benadryl  not fulfilled due to pharmacy concerns about interaction with quetiapine . She wants to try other options as recommended by her doctor. - Vitals normal and neurology exam wnl - She verbalizes understanding of reaction of Seroquel  + Reglan  and is going to hold her Seroquel  while she plans to get her migraine under control with Reglan  - Low threshold to go to the ER if no improvement or if any worsening - Provide a work note for her.  Pregnancy, current Current pregnancy with no reported complications related to migraines. Blood pressure well-controlled. No nausea or vomiting reported. Taking prenatal vitamins and maintaining hydration and nutrition. - Ensure adequate hydration and nutrition. - Continue taking prenatal vitamins.   Lucie Buttner, PA-C

## 2024-03-11 NOTE — Patient Instructions (Signed)
 It was great to see you!  We will refill Reglan  and ask them to fill this prescription  If symptom(s) remain uncontrolled, go to the ER   Take care,  Taegan Standage PA-C

## 2024-03-17 ENCOUNTER — Ambulatory Visit: Admitting: Internal Medicine

## 2024-03-19 ENCOUNTER — Telehealth: Payer: Self-pay

## 2024-03-19 ENCOUNTER — Telehealth: Payer: Self-pay | Admitting: Internal Medicine

## 2024-03-19 ENCOUNTER — Other Ambulatory Visit: Payer: Self-pay | Admitting: Internal Medicine

## 2024-03-19 DIAGNOSIS — F4001 Agoraphobia with panic disorder: Secondary | ICD-10-CM

## 2024-03-19 NOTE — Telephone Encounter (Signed)
 Copied from CRM [id number]. Topic: Clinical - Medication Refill >> Mar 19, 2024 10:16 AM Shereese L wrote: Medication: escitalopram  (LEXAPRO ) 20 MG tablet  Has the patient contacted their pharmacy? Yes (Agent: If no, request that the patient contact the pharmacy for the refill. If patient does not wish to contact the pharmacy document the reason why and proceed with request.) (Agent: If yes, when and what did the pharmacy advise?)  This is the patient's preferred pharmacy:  [facility] [address] [place] - [address] [place] [address] Phone: [phone] Fax: [phone]  Is this the correct pharmacy for this prescription? Yes If no, delete pharmacy and type the correct one.   Has the prescription been filled recently? Yes  Is the patient out of the medication? Yes  Has the patient been seen for an appointment in the last year OR does the patient have an upcoming appointment? Yes  Can we respond through MyChart? Yes  Agent: Please be advised that Rx refills may take up to 3 business days. We ask that you follow-up with your pharmacy.

## 2024-03-19 NOTE — Telephone Encounter (Unsigned)
 Copied from CRM [id number]. Topic: Clinical - Medication Refill >> Mar 19, 2024 10:16 AM Shereese L wrote: Medication: escitalopram  (LEXAPRO ) 20 MG tablet  Has the patient contacted their pharmacy? Yes (Agent: If no, request that the patient contact the pharmacy for the refill. If patient does not wish to contact the pharmacy document the reason why and proceed with request.) (Agent: If yes, when and what did the pharmacy advise?)  This is the patient's preferred pharmacy:  [facility] [address] [place] - [address] [place] [address] Phone: [phone] Fax: [phone]  Is this the correct pharmacy for this prescription? Yes If no, delete pharmacy and type the correct one.   Has the prescription been filled recently? Yes  Is the patient out of the medication? Yes  Has the patient been seen for an appointment in the last year OR does the patient have an upcoming appointment? Yes  Can we respond through MyChart? Yes  Agent: Please be advised that Rx refills may take up to 3 business days. We ask that you follow-up with your pharmacy.

## 2024-03-22 ENCOUNTER — Other Ambulatory Visit: Payer: Self-pay | Admitting: Physician Assistant

## 2024-03-22 DIAGNOSIS — G4489 Other headache syndrome: Secondary | ICD-10-CM

## 2024-03-25 ENCOUNTER — Ambulatory Visit: Admitting: Internal Medicine

## 2024-03-26 ENCOUNTER — Ambulatory Visit (INDEPENDENT_AMBULATORY_CARE_PROVIDER_SITE_OTHER): Admitting: Physician Assistant

## 2024-03-26 VITALS — BP 122/88 | HR 55 | Wt 196.0 lb

## 2024-03-26 DIAGNOSIS — F331 Major depressive disorder, recurrent, moderate: Secondary | ICD-10-CM

## 2024-03-26 DIAGNOSIS — Z87898 Personal history of other specified conditions: Secondary | ICD-10-CM | POA: Diagnosis not present

## 2024-03-26 DIAGNOSIS — O0990 Supervision of high risk pregnancy, unspecified, unspecified trimester: Secondary | ICD-10-CM

## 2024-03-26 DIAGNOSIS — F411 Generalized anxiety disorder: Secondary | ICD-10-CM

## 2024-03-26 DIAGNOSIS — Z3A16 16 weeks gestation of pregnancy: Secondary | ICD-10-CM

## 2024-03-26 DIAGNOSIS — F17290 Nicotine dependence, other tobacco product, uncomplicated: Secondary | ICD-10-CM

## 2024-03-26 DIAGNOSIS — G43809 Other migraine, not intractable, without status migrainosus: Secondary | ICD-10-CM

## 2024-03-26 NOTE — Progress Notes (Signed)
 ROB in office, reports feeling fetal movement and denies pain.

## 2024-03-26 NOTE — Progress Notes (Signed)
   PRENATAL VISIT NOTE  Subjective:  Natasha Pope  is a 28 y.o. G2P0010 at [redacted]w[redacted]d being seen today for ongoing prenatal care.  She is currently monitored for the following issues for this high-risk pregnancy and has Nicotine  dependence due to vaping tobacco product; Asthma; GAD (generalized anxiety disorder); ASCUS of cervix with negative high risk HPV; Macrocytosis; Substance induced mood disorder (HCC); Alcohol use with alcohol-induced mood disorder (HCC); Alcohol use disorder, severe, in early remission, dependence (HCC); Recurrent major depressive disorder (HCC); Panic disorder with agoraphobia; Delusional disorder (HCC); Auditory hallucinations; Visual hallucination; Chronic abdominal pain; PTSD (post-traumatic stress disorder); Memory loss; Snoring; Obesity (BMI 30-39.9); Financial difficulties; Contraceptive management; GERD (gastroesophageal reflux disease); Migraine; Nausea; History of alcohol use disorder; Cannabis use with anxiety disorder (HCC); Parasomnia; Pruritic dermatitis; and Supervision of high risk pregnancy, antepartum on their problem list.  Patient reports no complaints.  Contractions: Not present. Vag. Bleeding: None.  Movement: Present. Denies leaking of fluid.   The following portions of the patient's history were reviewed and updated as appropriate: allergies, current medications, past family history, past medical history, past social history, past surgical history and problem list.   Objective:    Vitals:   03/26/24 1138  BP: 122/88  Pulse: (!) 55  Weight: 196 lb (88.9 kg)    Fetal Status:  Fetal Heart Rate (bpm): 156 Fundal Height: 15 cm Movement: Present    General: Alert, oriented and cooperative. Patient is in no acute distress.  Skin: Skin is warm and dry. No rash noted.   Cardiovascular: Normal heart rate noted  Respiratory: Normal respiratory effort, no problems with respiration noted  Abdomen: Soft, gravid, appropriate for gestational age.   Pain/Pressure: Absent     Pelvic: Cervical exam deferred        Extremities: Normal range of motion.  Edema: None  Mental Status: Normal mood and affect. Normal behavior. Normal judgment and thought content.   Assessment and Plan:  Pregnancy: G2P0010 at [redacted]w[redacted]d  1. Supervision of high risk pregnancy, antepartum (Primary) Patient doing well BP, FHR, FH appropriate  2. [redacted] weeks gestation of pregnancy Anticipatory guidance about next visits/weeks of pregnancy given.   3. Other migraine without status migrainosus, not intractable Stable Imitrex  50 mg, propanolol 60 mg for ppx, managed by PCP  4. Moderate episode of recurrent major depressive disorder (HCC) 5. GAD (generalized anxiety disorder) Stable Quetiapine  100 mg, Buspar  15 mg, Lexapro  20 mg    6. History of alcohol use disorder 7. Nicotine  dependence due to vaping tobacco product Last alcohol use six weeks ago. Requested REACH appointment again today.   Preterm labor symptoms and general obstetric precautions including but not limited to vaginal bleeding, contractions, leaking of fluid and fetal movement were reviewed in detail with the patient.  Please refer to After Visit Summary for other counseling recommendations.   Return in about 4 weeks (around 04/23/2024) for [facility].  Future Appointments  Date Time Provider Department Center  04/23/2024  1:50 PM Dunn, Rollo DASEN, MD [facility] None    Jorene FORBES Moats, PA-C

## 2024-03-28 LAB — AFP, SERUM, OPEN SPINA BIFIDA
AFP MoM: 1.35
AFP Value: 41.5 ng/mL
Gest. Age on Collection Date: 16 wk
Maternal Age At EDD: 27.7 a
OSBR Risk 1 IN: 8303
Test Results:: NEGATIVE
Weight: 196 [lb_av]

## 2024-03-30 ENCOUNTER — Telehealth: Payer: Self-pay | Admitting: Family Medicine

## 2024-03-30 ENCOUNTER — Ambulatory Visit: Payer: Self-pay | Admitting: Physician Assistant

## 2024-03-30 NOTE — Telephone Encounter (Signed)
 Called patient and left VM with call back number for patient to call and get scheduled for REACH clinic. Will try calling again later.

## 2024-03-31 ENCOUNTER — Other Ambulatory Visit: Payer: Self-pay

## 2024-03-31 ENCOUNTER — Ambulatory Visit (INDEPENDENT_AMBULATORY_CARE_PROVIDER_SITE_OTHER): Admitting: Advanced Practice Midwife

## 2024-03-31 ENCOUNTER — Other Ambulatory Visit (HOSPITAL_COMMUNITY)
Admission: RE | Admit: 2024-03-31 | Discharge: 2024-03-31 | Disposition: A | Discharge status: Home / Self Care | Source: Ambulatory Visit | Attending: Advanced Practice Midwife | Admitting: Advanced Practice Midwife

## 2024-03-31 VITALS — BP 120/81 | HR 71 | Wt 192.6 lb

## 2024-03-31 DIAGNOSIS — O99312 Alcohol use complicating pregnancy, second trimester: Secondary | ICD-10-CM

## 2024-03-31 DIAGNOSIS — Z3A17 17 weeks gestation of pregnancy: Secondary | ICD-10-CM

## 2024-03-31 DIAGNOSIS — F1021 Alcohol dependence, in remission: Secondary | ICD-10-CM | POA: Diagnosis not present

## 2024-03-31 DIAGNOSIS — F331 Major depressive disorder, recurrent, moderate: Secondary | ICD-10-CM | POA: Diagnosis not present

## 2024-03-31 DIAGNOSIS — O0992 Supervision of high risk pregnancy, unspecified, second trimester: Secondary | ICD-10-CM | POA: Diagnosis not present

## 2024-03-31 DIAGNOSIS — G4489 Other headache syndrome: Secondary | ICD-10-CM

## 2024-03-31 DIAGNOSIS — O26892 Other specified pregnancy related conditions, second trimester: Secondary | ICD-10-CM

## 2024-03-31 DIAGNOSIS — N898 Other specified noninflammatory disorders of vagina: Secondary | ICD-10-CM | POA: Insufficient documentation

## 2024-03-31 DIAGNOSIS — F17290 Nicotine dependence, other tobacco product, uncomplicated: Secondary | ICD-10-CM

## 2024-03-31 DIAGNOSIS — F4310 Post-traumatic stress disorder, unspecified: Secondary | ICD-10-CM

## 2024-03-31 DIAGNOSIS — O99310 Alcohol use complicating pregnancy, unspecified trimester: Secondary | ICD-10-CM

## 2024-03-31 DIAGNOSIS — O0990 Supervision of high risk pregnancy, unspecified, unspecified trimester: Secondary | ICD-10-CM

## 2024-03-31 MED ORDER — METOCLOPRAMIDE HCL 10 MG PO TABS: 10.0000 mg | ORAL_TABLET | Freq: Four times a day (QID) | ORAL | 2 refills | Status: AC | PRN

## 2024-03-31 NOTE — Progress Notes (Signed)
   Subjective:  Natasha Pope  is a 28 y.o. G2P0010 at [redacted]w[redacted]d being seen today for ongoing prenatal care.  She is currently monitored for the following issues for this high-risk pregnancy and has Nicotine  dependence due to vaping tobacco product; Asthma; GAD (generalized anxiety disorder); ASCUS of cervix with negative high risk HPV; Macrocytosis; Substance induced mood disorder (HCC); Alcohol use with alcohol-induced mood disorder (HCC); Alcohol use disorder, severe, in early remission, dependence (HCC); Recurrent major depressive disorder (HCC); Panic disorder with agoraphobia; Delusional disorder (HCC); Auditory hallucinations; Visual hallucination; Chronic abdominal pain; PTSD (post-traumatic stress disorder); Memory loss; Snoring; Obesity (BMI 30-39.9); Financial difficulties; GERD (gastroesophageal reflux disease); Migraine; Nausea; History of alcohol use disorder; Cannabis use with anxiety disorder (HCC); Parasomnia; Pruritic dermatitis; and Supervision of high risk pregnancy, antepartum on their problem list.  Patient reports {sx:14538}.  Contractions: Not present. Vag. Bleeding: None.  Movement: Present. Denies leaking of fluid.   The following portions of the patient's history were reviewed and updated as appropriate: allergies, current medications, past family history, past medical history, past social history, past surgical history and problem list. Problem list updated.  Objective:   Vitals:   03/31/24 1541  BP: 120/81  Pulse: 71  Weight: 192 lb 9.6 oz (87.4 kg)    Fetal Status: Fetal Heart Rate (bpm): 155   Movement: Present     General:  Alert, oriented and cooperative. Patient is in no acute distress.  Skin: Skin is warm and dry. No rash noted.   Cardiovascular: Normal heart rate noted  Respiratory: Normal respiratory effort, no problems with respiration noted  Abdomen: Soft, gravid, appropriate for gestational age. Pain/Pressure: Absent     Pelvic: Vag. Bleeding: None      Cervical exam performed        Extremities: Normal range of motion.  Edema: None  Mental Status: Normal mood and affect. Normal behavior. Normal judgment and thought content.   Urinalysis:      PDMP not reviewed this encounter.***  Last UDS: No results found for: CREATIUR   Assessment and Plan:  Pregnancy: G2P0010 at [redacted]w[redacted]d  1. Vaginal discharge during pregnancy in second trimester (Primary) - Cervicovaginal ancillary only( Reserve)  2. Other headache syndrome - metoCLOPramide  (REGLAN ) 10 MG tablet; Take 1 tablet (10 mg total) by mouth every 6 (six) hours as needed for nausea (or heasache).  Dispense: 30 tablet; Refill: 2   {Blank single:19197::Term,Preterm} labor symptoms and general obstetric precautions including but not limited to vaginal bleeding, contractions, leaking of fluid and fetal movement were reviewed in detail with the patient. Please refer to After Visit Summary for other counseling recommendations.   Return in about 1 week (around 04/07/2024) for REACH ROB.   Future Appointments  Date Time Provider Department Center  04/23/2024  1:50 PM Abigail Rollo DASEN, MD [facility] None  05/06/2024  7:00 AM [facility] PROVIDER 1 [facility]  05/06/2024  7:30 AM [facility] US2 [facility]    Total face-to-face time with patient: {Blank single:19197::10,15,20,25,30} minutes.  Over 50% of encounter was spent on counseling and coordination of care.   Anna Beaird  Claudene HOWARD 03/31/2024 5:42 PM Center for [employer] [facility], [facility]

## 2024-04-02 LAB — CERVICOVAGINAL ANCILLARY ONLY
Bacterial Vaginitis (gardnerella): POSITIVE — AB
Candida Glabrata: NEGATIVE
Candida Vaginitis: NEGATIVE
Chlamydia: NEGATIVE
Comment: NEGATIVE
Comment: NEGATIVE
Comment: NEGATIVE
Comment: NEGATIVE
Comment: NEGATIVE
Comment: NORMAL
Neisseria Gonorrhea: NEGATIVE
Trichomonas: NEGATIVE

## 2024-04-03 ENCOUNTER — Ambulatory Visit: Payer: Self-pay | Admitting: Advanced Practice Midwife

## 2024-04-03 DIAGNOSIS — B9689 Other specified bacterial agents as the cause of diseases classified elsewhere: Secondary | ICD-10-CM

## 2024-04-03 DIAGNOSIS — O99310 Alcohol use complicating pregnancy, unspecified trimester: Secondary | ICD-10-CM | POA: Insufficient documentation

## 2024-04-03 MED ORDER — METRONIDAZOLE 500 MG PO TABS: 500.0000 mg | ORAL_TABLET | Freq: Two times a day (BID) | ORAL | 0 refills | Status: DC

## 2024-04-06 NOTE — Progress Notes (Unsigned)
   Subjective:  Natasha Pope  is a 28 y.o. G2P0010 at [redacted]w[redacted]d being seen today for ongoing prenatal care.  She is currently monitored for the following issues for this high-risk pregnancy and has Nicotine  dependence due to vaping tobacco product; Asthma; GAD (generalized anxiety disorder); Macrocytosis; Substance induced mood disorder (HCC); Alcohol use with alcohol-induced mood disorder (HCC); Alcohol use disorder, severe, in early remission, dependence (HCC); Recurrent major depressive disorder (HCC); Panic disorder with agoraphobia; Delusional disorder (HCC); Auditory hallucinations; Visual hallucination; Chronic abdominal pain; PTSD (post-traumatic stress disorder); Memory loss; Snoring; Obesity (BMI 30-39.9); Financial difficulties; GERD (gastroesophageal reflux disease); Migraine; Nausea; History of alcohol use disorder; Cannabis use with anxiety disorder (HCC); Parasomnia; Pruritic dermatitis; and Supervision of high risk pregnancy, antepartum on their problem list.  Patient reports {sx:14538}.   .  .   . Denies leaking of fluid.   The following portions of the patient's history were reviewed and updated as appropriate: allergies, current medications, past family history, past medical history, past social history, past surgical history and problem list. Problem list updated.  Objective:  There were no vitals filed for this visit.  Fetal Status:           General:  Alert, oriented and cooperative. Patient is in no acute distress.  Skin: Skin is warm and dry. No rash noted.   Cardiovascular: Normal heart rate noted  Respiratory: Normal respiratory effort, no problems with respiration noted  Abdomen: Soft, gravid, appropriate for gestational age.       Pelvic:       {Blank single:19197::Cervical exam performed,Cervical exam deferred}        Extremities: Normal range of motion.     Mental Status: Normal mood and affect. Normal behavior. Normal judgment and thought content.    Urinalysis:      PDMP not reviewed this encounter.    Last UDS: No results found for: CREATIUR   Assessment and Plan:  Pregnancy: G2P0010 at [redacted]w[redacted]d  1. Supervision of high risk pregnancy, antepartum (Primary) BP and FHR normal*** AFP ***  2. Alcohol use disorder, severe, in early remission, dependence (HCC) Discussed at last visit, at that time denied any use of EtOH at present Continues to deny use currently*** Reviewed few treatments available. Given not using EtOH at present do not feel risk/benefit balance is there for PO naltrexone  or other PO meds Discussed AA, ***  3. Moderate episode of recurrent major depressive disorder (HCC) Per last note she was on lexapro , abilify , seroquel , but not sure about safety/benefit We discussed that there is no healthy baby without a healthy mother, and that if she derives significant benefit to her mental health from these meds then I recommend continuing them and changing/uptitrating as needed***  Preterm labor symptoms and general obstetric precautions including but not limited to vaginal bleeding, contractions, leaking of fluid and fetal movement were reviewed in detail with the patient. Please refer to After Visit Summary for other counseling recommendations.  No follow-ups on file.  Future Appointments  Date Time Provider Department Center  04/07/2024  1:35 PM Lola Donnice HERO, MD [facility]  04/23/2024  1:50 PM Abigail Rollo DASEN, MD [facility] None  05/06/2024  7:00 AM [facility] PROVIDER 1 [facility]  05/06/2024  7:30 AM [facility] US2 [facility]     Lola Donnice HERO, MD

## 2024-04-07 ENCOUNTER — Encounter: Payer: Self-pay | Admitting: Family Medicine

## 2024-04-07 ENCOUNTER — Encounter: Payer: Self-pay | Admitting: Pediatrics

## 2024-04-07 ENCOUNTER — Ambulatory Visit (INDEPENDENT_AMBULATORY_CARE_PROVIDER_SITE_OTHER): Admitting: Family Medicine

## 2024-04-07 ENCOUNTER — Other Ambulatory Visit: Payer: Self-pay

## 2024-04-07 VITALS — BP 112/70 | HR 68 | Wt 194.6 lb

## 2024-04-07 DIAGNOSIS — Z3A18 18 weeks gestation of pregnancy: Secondary | ICD-10-CM | POA: Diagnosis not present

## 2024-04-07 DIAGNOSIS — F411 Generalized anxiety disorder: Secondary | ICD-10-CM

## 2024-04-07 DIAGNOSIS — O0992 Supervision of high risk pregnancy, unspecified, second trimester: Secondary | ICD-10-CM | POA: Diagnosis not present

## 2024-04-07 DIAGNOSIS — F331 Major depressive disorder, recurrent, moderate: Secondary | ICD-10-CM | POA: Diagnosis not present

## 2024-04-07 DIAGNOSIS — F4001 Agoraphobia with panic disorder: Secondary | ICD-10-CM | POA: Diagnosis not present

## 2024-04-07 DIAGNOSIS — O0990 Supervision of high risk pregnancy, unspecified, unspecified trimester: Secondary | ICD-10-CM

## 2024-04-07 DIAGNOSIS — F1021 Alcohol dependence, in remission: Secondary | ICD-10-CM

## 2024-04-07 MED ORDER — ESCITALOPRAM OXALATE 20 MG PO TABS: 20.0000 mg | ORAL_TABLET | Freq: Every day | ORAL | 3 refills | Status: AC

## 2024-04-07 MED ORDER — AUVELITY 45-105 MG PO TBCR
1.0000 | EXTENDED_RELEASE_TABLET | Freq: Every day | ORAL | 3 refills | Status: AC
Start: 2024-04-07 — End: ?

## 2024-04-08 ENCOUNTER — Encounter: Payer: Self-pay | Admitting: Physician Assistant

## 2024-04-08 ENCOUNTER — Telehealth: Payer: Self-pay | Admitting: Internal Medicine

## 2024-04-08 ENCOUNTER — Telehealth: Payer: Self-pay | Admitting: Lactation Services

## 2024-04-08 ENCOUNTER — Telehealth: Payer: Self-pay

## 2024-04-08 ENCOUNTER — Encounter: Payer: Self-pay | Admitting: Family Medicine

## 2024-04-08 NOTE — Telephone Encounter (Signed)
 Awaiting determination   [name] Meckel  (Key: [id number]) Rx #: [id number] Auvelity  45-105MG  er tablets Form [employer] Electronic Prior Authorization Form Created 20 hours ago Sent to Plan 6 minutes ago Plan Response 6 minutes ago Submit Clinical Questions Expires 5 days from now Determination

## 2024-04-08 NOTE — Telephone Encounter (Unsigned)
 Copied from CRM [id number]. Topic: Clinical - Refused Triage >> Apr 08, 2024  2:27 PM Lauren C wrote: Pts grandmother Marval calling in to request PA for pts anxiety meds, she said pt is experiencing extreme anxiety. She said she is going to pick up pt tomorrow and she will suggest that pt reaches out to us  to speak with triage if she would like. Declined transfer to triage.

## 2024-04-08 NOTE — Telephone Encounter (Signed)
-----   Message from Nurse Margy ORN sent at 04/08/2024  2:38 PM EDT ----- Pt's grandmother calling to request a prior auth for the medications that were prescribed at recent visit. Reports that pt is struggling without medication. Thanks!

## 2024-04-08 NOTE — Telephone Encounter (Signed)
 Copied from CRM [id number]. Topic: Clinical - Medication Prior Auth >> Apr 08, 2024  2:25 PM Lauren C wrote: Reason for CRM: PA needed for escitalopram  (LEXAPRO ) 20 MG tablet and Dextromethorphan -buPROPion  ER (AUVELITY ) 45-105 MG TBCR Sent to: [id number] - [place], [place] - [address] [place] [address] Phone: [phone] Fax: [phone]

## 2024-04-09 NOTE — Telephone Encounter (Signed)
 Received notification from Reena, RN that prior shara has been completed and pt notified.   Nanie Dunkleberger,RN

## 2024-04-09 NOTE — Telephone Encounter (Signed)
 Called and spoke with [facility] and they report patient has called and informed them PA approved for Auvelity  and they have filled. They report Lexapro  does not need PA and can be filled after tomorrow.   Will notify patient via Mychart.

## 2024-04-09 NOTE — Telephone Encounter (Signed)
 Natasha Pope  (Key: [id number]) Rx #: [id number] Auvelity  45-105MG  er tablets Form [employer] Electronic Prior Authorization Form Created 1 day ago Sent to Plan 21 hours ago Plan Response 21 hours ago Submit Clinical Questions 20 hours ago Determination Favorable 14 hours ago Message from Plan Approved. AUVELITY  45/105/MG Tablet ER is approved from 04/08/2024 to 04/08/2025.SABRA Authorization Expiration Date: April 08, 2025.

## 2024-04-10 ENCOUNTER — Other Ambulatory Visit (HOSPITAL_COMMUNITY): Payer: Self-pay

## 2024-04-10 ENCOUNTER — Telehealth: Payer: Self-pay

## 2024-04-10 ENCOUNTER — Other Ambulatory Visit: Payer: Self-pay | Admitting: Internal Medicine

## 2024-04-10 DIAGNOSIS — F322 Major depressive disorder, single episode, severe without psychotic features: Secondary | ICD-10-CM

## 2024-04-10 NOTE — Telephone Encounter (Signed)
 Pharmacy Patient Advocate Encounter   Received notification from Onbase that prior authorization for Auvelity  45-105MG  er tablets is required/requested.   Insurance verification completed.   The patient is insured through [facility] MEDICAID .   Per test claim: Refill too soon. PA is not needed at this time. Medication was filled 04/09/24. Next eligible fill date is 04/21/24.

## 2024-04-21 ENCOUNTER — Encounter: Admitting: Advanced Practice Midwife

## 2024-04-21 ENCOUNTER — Other Ambulatory Visit: Payer: Self-pay

## 2024-04-21 NOTE — Progress Notes (Deleted)
   Subjective:  Natasha Pope  is a 28 y.o. G2P0010 at [redacted]w[redacted]d being seen today for ongoing prenatal care.  She is currently monitored for the following issues for this high-risk pregnancy and has Nicotine  dependence due to vaping tobacco product; Asthma; GAD (generalized anxiety disorder); Macrocytosis; Substance induced mood disorder (HCC); Alcohol use with alcohol-induced mood disorder (HCC); Alcohol use disorder, severe, in early remission, dependence (HCC); Recurrent major depressive disorder; Panic disorder with agoraphobia; Delusional disorder (HCC); Auditory hallucinations; Visual hallucination; Chronic abdominal pain; PTSD (post-traumatic stress disorder); Memory loss; Snoring; Obesity (BMI 30-39.9); Financial difficulties; GERD (gastroesophageal reflux disease); Migraine; Nausea; History of alcohol use disorder; Cannabis use with anxiety disorder (HCC); Parasomnia; Pruritic dermatitis; and Supervision of high risk pregnancy, antepartum on their problem list.  Patient reports {sx:14538}.   .  .   . Denies leaking of fluid.   The following portions of the patient's history were reviewed and updated as appropriate: allergies, current medications, past family history, past medical history, past social history, past surgical history and problem list. Problem list updated.  Objective:  There were no vitals filed for this visit.  Fetal Status:           General:  Alert, oriented and cooperative. Patient is in no acute distress.  Skin: Skin is warm and dry. No rash noted.   Cardiovascular: Normal heart rate noted  Respiratory: Normal respiratory effort, no problems with respiration noted  Abdomen: Soft, gravid, appropriate for gestational age.       Pelvic:       {Blank single:19197::Cervical exam performed,Cervical exam deferred}        Extremities: Normal range of motion.     Mental Status: Normal mood and affect. Normal behavior. Normal judgment and thought content.    Urinalysis:      PDMP reviewed during this encounter.   Last UDS: No results found for: CREATIUR   Assessment and Plan:  Pregnancy: G2P0010 at [redacted]w[redacted]d  1. Alcohol use with alcohol-induced mood disorder (HCC) (Primary)  2. GAD (generalized anxiety disorder)  3. Alcohol use disorder, severe, in early remission, dependence (HCC)  4. Supervision of high risk pregnancy, antepartum  5. [redacted] weeks gestation of pregnancy   {Blank single:19197::Term,Preterm} labor symptoms and general obstetric precautions including but not limited to vaginal bleeding, contractions, leaking of fluid and fetal movement were reviewed in detail with the patient. Please refer to After Visit Summary for other counseling recommendations.   No follow-ups on file.   Future Appointments  Date Time Provider Department Center  04/21/2024  2:35 PM Claudene Aures , CNM [facility]  04/23/2024  1:50 PM Abigail Rollo DASEN, MD [facility] None  05/06/2024  7:00 AM [facility] PROVIDER 1 [facility]  05/06/2024  7:30 AM [facility] US2 [facility]    Total face-to-face time with patient: {Blank single:19197::10,15,20,25,30} minutes.  Over 50% of encounter was spent on counseling and coordination of care.   Krissie Merrick  Claudene HOWARD 04/21/2024 11:57 AM Center for [employer], [facility]

## 2024-04-23 ENCOUNTER — Ambulatory Visit: Admitting: Obstetrics and Gynecology

## 2024-04-23 VITALS — BP 122/60 | HR 69 | Wt 192.4 lb

## 2024-04-23 DIAGNOSIS — F1021 Alcohol dependence, in remission: Secondary | ICD-10-CM | POA: Diagnosis not present

## 2024-04-23 DIAGNOSIS — O0990 Supervision of high risk pregnancy, unspecified, unspecified trimester: Secondary | ICD-10-CM

## 2024-04-23 DIAGNOSIS — F331 Major depressive disorder, recurrent, moderate: Secondary | ICD-10-CM | POA: Diagnosis not present

## 2024-04-23 NOTE — Patient Instructions (Signed)
 Second Trimester of Pregnancy  The second trimester of pregnancy is from week 14 through week 27. This is months 4 through 6 of pregnancy. During the second trimester: Morning sickness is less or has stopped. You may have more energy. You may feel hungry more often. At this time, your unborn baby is growing very fast. At the end of the sixth month, the unborn baby may be up to 12 inches long and weigh about 1 pounds. You will likely start to feel the baby move between 16 and 20 weeks of pregnancy. Body changes during your second trimester Your body continues to change during this time. The changes usually go away after your baby is born. Physical changes You will gain more weight. Your belly will get bigger. You may begin to get stretch marks on your hips, belly, and breasts. Your breasts will keep growing and may hurt. You may get dark spots or blotches on your face. A dark line from your belly button to the pubic area may appear. This line is called linea nigra. Your hair may grow faster and get thicker. Health changes You may have headaches. You may have heartburn. You may pee more often. You may have swollen, bulging veins (varicose veins). You may have trouble pooping (constipation), or swollen veins in the butt that can itch or get painful (hemorrhoids). You may have back pain. This is caused by: Weight gain. Pregnancy hormones that are relaxing the joints in your pelvis. Follow these instructions at home: Medicines Talk to your health care provider if you're taking medicines. Ask if the medicines are safe to take during pregnancy. Your provider may change the medicines that you take. Do not take any medicines unless told to by your provider. Take a prenatal vitamin that has at least 600 micrograms (mcg) of folic acid . Do not use herbal medicines, illegal drugs, or medicines that are not approved by your provider. Eating and drinking While you're pregnant your body needs  extra food for your growing baby. Talk with your provider about what to eat while pregnant. Activity Most women are able to exercise during pregnancy. Exercises may need to change as your pregnancy goes on. Talk to your provider about your activities and exercise routines. Relieving pain and discomfort Wear a good, supportive bra if your breasts hurt. Rest with your legs raised if you have leg cramps or low back pain. Take warm sitz baths to soothe pain from hemorrhoids. Use hemorrhoid cream if your provider says it's okay. Do not douche. Do not use tampons or scented pads. Do not use hot tubs, steam rooms, or saunas. Safety Wear your seatbelt at all times when you're in a car. Talk to your provider if someone hits you, hurts you, or yells at you. Talk with your provider if you're feeling sad or have thoughts of hurting yourself. Lifestyle Certain things can be harmful while you're pregnant. It's best to avoid the following: Do not drink alcohol,smoke, vape, or use products with nicotine  or tobacco in them. If you need help quitting, talk with your provider. Avoid cat litter boxes and soil used by cats. These things carry germs that can cause harm to your pregnancy and your baby. General instructions Keep all follow-up visits. It helps you and your unborn baby stay as healthy as possible. Write down your questions. Take them to your prenatal visits. Your provider will: Talk with you about your overall health. Give you advice or refer you to specialists who can help with different needs,  including: Prenatal education classes. Mental health and counseling. Foods and healthy eating. Ask for help if you need help with food. Where to find more information American Pregnancy Association: americanpregnancy.org [employer]: acog.org Office on [employer] Health: [employer] Contact a health care provider if: You have a headache that does not go away  when you take medicine. You have any of these problems: You can't eat or drink. You throw up or feel like you may throw up. You have watery poop (diarrhea) for 2 days or more. You have pain when you pee or your pee smells bad. You have been sick for 2 days or more and are not getting better. Contact your provider right away if: You have any of these coming from your vagina: Abnormal discharge. Bad-smelling fluid. Bleeding. Your baby is moving less than usual. You have contractions, belly cramping, or have pain in your pelvis or lower back. You have symptoms of high blood pressure or preeclampsia. These include: A severe, throbbing headache that does not go away. Sudden or extreme swelling of your face, hands, legs, or feet. Vision problems: You see spots. You have blurry vision. Your eyes are sensitive to light. If you can't reach the provider, go to an urgent care or emergency room. Get help right away if: You faint, become confused, or can't think clearly. You have chest pain or trouble breathing. You have any kind of injury, such as from a fall or a car crash. These symptoms may be an emergency. Call 911 right away. Do not wait to see if the symptoms will go away. Do not drive yourself to the hospital. This information is not intended to replace advice given to you by your health care provider. Make sure you discuss any questions you have with your health care provider. Document Revised: 04/11/2023 Document Reviewed: 11/09/2022 Elsevier Patient Education  2024 [employer] Inc. Round Ligament Pain  The round ligaments are a pair of cord-like tissues that help support the uterus. They can become a source of pain during pregnancy as the ligaments soften and stretch as the baby grows. The pain usually begins in the second trimester (13-28 weeks) of pregnancy, and should only last for a few seconds when it occurs. However, the pain can come and go until the baby is delivered. The pain  does not cause harm to the baby. Round ligament pain is usually a short, sharp, and pinching pain, but it can also be a dull, lingering, and aching pain. The pain is felt in the lower side of the abdomen or in the groin. It usually starts deep in the groin and moves up to the outside of the hip area. The pain may happen when you: Suddenly change position, such as quickly going from a sitting to standing position. Do physical activity. Cough or sneeze. Follow these instructions at home: Managing pain  When the pain starts, relax. Then, try any of these methods to help with the pain: Sit down. Flex your knees up to your abdomen. Lie on your side with one pillow under your abdomen and another pillow between your legs. Sit in a warm bath for 15-20 minutes or until the pain goes away. General instructions Watch your condition for any changes. Move slowly when you sit down or stand up. Stop or reduce your physical activities if they cause pain. Avoid long walks if they cause pain. Take over-the-counter and prescription medicines only as told by your health care provider. Keep all follow-up visits. This  is important. Contact a health care provider if: Your pain does not go away with treatment. You feel pain in your back that you did not have before. Your medicine is not helping. You have a fever or chills. You have nausea or vomiting. You have diarrhea. You have pain when you urinate. Get help right away if: You have pain that is a rhythmic, cramping pain similar to labor pains. Labor pains are usually 2 minutes apart, last for about 1 minute, and involve a bearing down feeling or pressure in your pelvis. You have vaginal bleeding. These symptoms may represent a serious problem that is an emergency. Do not wait to see if the symptoms will go away. Get medical help right away. Call your local emergency services (911 in the [place] Do not drive yourself to the hospital. Summary Round ligament  pain is felt in the lower abdomen or groin. This pain usually begins in the second trimester (13-28 weeks) and should only last for a few seconds when it occurs. You may notice the pain when you suddenly change position, when you cough or sneeze, or during physical activity. Relaxing, flexing your knees to your abdomen, lying on one side, or taking a warm bath may help to get rid of the pain. Contact your health care provider if the pain does not go away. This information is not intended to replace advice given to you by your health care provider. Make sure you discuss any questions you have with your health care provider. Document Revised: 09/21/2020 Document Reviewed: 09/21/2020 Elsevier Patient Education  2024 [employer].

## 2024-04-23 NOTE — Progress Notes (Signed)
   PRENATAL VISIT NOTE  Subjective:  Natasha Pope  is a 28 y.o. G2P0010 at [redacted]w[redacted]d being seen today for ongoing prenatal care.  She is currently monitored for the following issues for this high-risk pregnancy and has Nicotine  dependence due to vaping tobacco product; Asthma; GAD (generalized anxiety disorder); Macrocytosis; Substance induced mood disorder (HCC); Alcohol use with alcohol-induced mood disorder (HCC); Alcohol use disorder, severe, in early remission, dependence (HCC); Recurrent major depressive disorder; Panic disorder with agoraphobia; Delusional disorder (HCC); Auditory hallucinations; Visual hallucination; Chronic abdominal pain; PTSD (post-traumatic stress disorder); Memory loss; Snoring; Obesity (BMI 30-39.9); Financial difficulties; GERD (gastroesophageal reflux disease); Migraine; Nausea; History of alcohol use disorder; Cannabis use with anxiety disorder (HCC); Parasomnia; Pruritic dermatitis; and Supervision of high risk pregnancy, antepartum on their problem list.  Patient reports no complaints.  Continues to do well in recovery. Contractions: Not present. Vag. Bleeding: None.  Movement: Present. Denies leaking of fluid.   The following portions of the patient's history were reviewed and updated as appropriate: allergies, current medications, past family history, past medical history, past social history, past surgical history and problem list.   Objective:   Vitals:   04/23/24 1350  BP: 122/60  Pulse: 69  Weight: 192 lb 6.4 oz (87.3 kg)   Body mass index is 32.02 kg/m. Total weight gain: 11 lb 6.4 oz (5.171 kg)   Fetal Status: Fetal Heart Rate (bpm): 155   Movement: Present     General:  Alert, oriented and cooperative. Patient is in no acute distress.  Skin: Skin is warm and dry. No rash noted.   Cardiovascular: Normal heart rate noted  Respiratory: Normal respiratory effort, no problems with respiration noted  Abdomen: Soft, gravid, appropriate for  gestational age.  Pain/Pressure: Absent     Pelvic: Cervical exam deferred        Extremities: Normal range of motion.  Edema: None  Mental Status: Normal mood and affect. Normal behavior. Normal judgment and thought content.   Assessment and Plan:  Pregnancy: G2P0010 at [redacted]w[redacted]d 1. Supervision of high risk pregnancy, antepartum (Primary) Anticipatory guidance Anatomy ultrasound 10/15 AFP neg Plans PNC with REACH, clarified with patient that she does not have to have appointments at both locations (double appts), however we are happy to see her at either  2. Alcohol use disorder, severe, in early remission, dependence (HCC) Continue current meds. Stable in recovery, declines any relapses. Support given.  3. Moderate episode of recurrent major depressive disorder (HCC) Continue current meds  Preterm labor symptoms and general obstetric precautions including but not limited to vaginal bleeding, contractions, leaking of fluid and fetal movement were reviewed in detail with the patient. Please refer to After Visit Summary for other counseling recommendations.   Return in about 4 weeks (around 05/21/2024).  Future Appointments  Date Time Provider Department Center  04/28/2024  3:35 PM Lola Donnice HERO, MD [facility]  05/06/2024  7:00 AM [facility] PROVIDER 1 [facility]  05/06/2024  7:30 AM [facility] US2 [facility]  05/21/2024 11:15 AM Khaleesi Gruel, Rollo DASEN, MD [facility] None    Rollo DASEN Bring, MD

## 2024-04-28 ENCOUNTER — Ambulatory Visit: Admitting: Advanced Practice Midwife

## 2024-04-28 VITALS — BP 133/73 | HR 68 | Wt 193.2 lb

## 2024-04-28 DIAGNOSIS — F1021 Alcohol dependence, in remission: Secondary | ICD-10-CM | POA: Diagnosis not present

## 2024-04-28 DIAGNOSIS — F331 Major depressive disorder, recurrent, moderate: Secondary | ICD-10-CM

## 2024-04-28 DIAGNOSIS — O0992 Supervision of high risk pregnancy, unspecified, second trimester: Secondary | ICD-10-CM | POA: Diagnosis not present

## 2024-04-28 DIAGNOSIS — Z3A20 20 weeks gestation of pregnancy: Secondary | ICD-10-CM | POA: Diagnosis not present

## 2024-04-28 DIAGNOSIS — F17290 Nicotine dependence, other tobacco product, uncomplicated: Secondary | ICD-10-CM

## 2024-04-28 DIAGNOSIS — O0990 Supervision of high risk pregnancy, unspecified, unspecified trimester: Secondary | ICD-10-CM

## 2024-04-28 DIAGNOSIS — Z3A21 21 weeks gestation of pregnancy: Secondary | ICD-10-CM

## 2024-04-28 NOTE — Patient Instructions (Signed)
[email or web address]  

## 2024-04-28 NOTE — Progress Notes (Signed)
   Subjective:  Natasha Pope  is a 28 y.o. G2P0010 at [redacted]w[redacted]d being seen today for ongoing prenatal care.  She is currently monitored for the following issues for this high-risk pregnancy and has Nicotine  dependence due to vaping tobacco product; Asthma; GAD (generalized anxiety disorder); Macrocytosis; Substance induced mood disorder (HCC); Alcohol use with alcohol-induced mood disorder (HCC); Alcohol use disorder, severe, in early remission, dependence (HCC); Recurrent major depressive disorder; Panic disorder with agoraphobia; Delusional disorder (HCC); Auditory hallucinations; Visual hallucination; Chronic abdominal pain; PTSD (post-traumatic stress disorder); Memory loss; Snoring; Obesity (BMI 30-39.9); Financial difficulties; GERD (gastroesophageal reflux disease); Migraine; Nausea; History of alcohol use disorder; Cannabis use with anxiety disorder (HCC); Parasomnia; Pruritic dermatitis; and Supervision of high risk pregnancy, antepartum on their problem list.  Patient reports no complaints.  Contractions: Not present. Vag. Bleeding: None.  Movement: Present. Denies leaking of fluid. Feeling much better back on BH meds. Dr. Lola Rx's 1 yr supply.   The following portions of the patient's history were reviewed and updated as appropriate: allergies, current medications, past family history, past medical history, past social history, past surgical history and problem list. Problem list updated.  Objective:   Vitals:   04/28/24 1359  BP: 133/73  Pulse: 68  Weight: 193 lb 3.2 oz (87.6 kg)    Fetal Status: Fetal Heart Rate (bpm): 150   Movement: Present     General:  Alert, oriented and cooperative. Patient is in no acute distress.  Skin: Skin is warm and dry. No rash noted.   Cardiovascular: Normal heart rate noted  Respiratory: Normal respiratory effort, no problems with respiration noted  Abdomen: Soft, gravid, appropriate for gestational age. Pain/Pressure: Absent     Pelvic:  Vag. Bleeding: None     Cervical exam deferred        Extremities: Normal range of motion.  Edema: None  Mental Status: Normal mood and affect. Normal behavior. Normal judgment and thought content.   Urinalysis:      PDMP not reviewed this encounter.   Last UDS: No results found for: CREATIUR   Assessment and Plan:  Pregnancy: G2P0010 at [redacted]w[redacted]d  1. Supervision of high risk pregnancy, antepartum (Primary)  2. [redacted] weeks gestation of pregnancy  3. Moderate episode of recurrent major depressive disorder (HCC)  4. Nicotine  dependence due to vaping tobacco product  5. Alcohol use disorder, severe, in early remission, dependence (HCC) - Stable. Plans to continue REACH. Feeling much more stable on BH meds.   Preterm labor symptoms and general obstetric precautions including but not limited to vaginal bleeding, contractions, leaking of fluid and fetal movement were reviewed in detail with the patient. Please refer to After Visit Summary for other counseling recommendations.   Return in about 4 weeks (around 05/26/2024) for REACH ROB.   Future Appointments  Date Time Provider Department Center  04/28/2024  3:35 PM Lola Donnice HERO, MD [facility]  05/06/2024  7:00 AM [facility] PROVIDER 1 [facility]  05/06/2024  7:30 AM [facility] US2 [facility]  05/21/2024 11:15 AM Dunn, Rollo DASEN, MD [facility] None    Total face-to-face time with patient: 20 minutes.  Over 50% of encounter was spent on counseling and coordination of care.   Chirag Krueger  Claudene HOWARD 04/28/2024 2:29 PM Center for [employer], [facility]

## 2024-04-30 ENCOUNTER — Other Ambulatory Visit: Payer: Self-pay | Admitting: Internal Medicine

## 2024-04-30 DIAGNOSIS — F5105 Insomnia due to other mental disorder: Secondary | ICD-10-CM

## 2024-05-06 ENCOUNTER — Ambulatory Visit

## 2024-05-06 ENCOUNTER — Other Ambulatory Visit: Payer: Self-pay | Admitting: *Deleted

## 2024-05-06 ENCOUNTER — Ambulatory Visit: Attending: Obstetrics and Gynecology | Admitting: Maternal & Fetal Medicine

## 2024-05-06 VITALS — BP 140/77

## 2024-05-06 DIAGNOSIS — F419 Anxiety disorder, unspecified: Secondary | ICD-10-CM | POA: Diagnosis not present

## 2024-05-06 DIAGNOSIS — O0990 Supervision of high risk pregnancy, unspecified, unspecified trimester: Secondary | ICD-10-CM

## 2024-05-06 DIAGNOSIS — O358XX0 Maternal care for other (suspected) fetal abnormality and damage, not applicable or unspecified: Secondary | ICD-10-CM | POA: Diagnosis not present

## 2024-05-06 DIAGNOSIS — O99322 Drug use complicating pregnancy, second trimester: Secondary | ICD-10-CM | POA: Insufficient documentation

## 2024-05-06 DIAGNOSIS — O35BXX0 Maternal care for other (suspected) fetal abnormality and damage, fetal cardiac anomalies, not applicable or unspecified: Secondary | ICD-10-CM

## 2024-05-06 DIAGNOSIS — E669 Obesity, unspecified: Secondary | ICD-10-CM | POA: Diagnosis not present

## 2024-05-06 DIAGNOSIS — Z3A22 22 weeks gestation of pregnancy: Secondary | ICD-10-CM

## 2024-05-06 DIAGNOSIS — O99212 Obesity complicating pregnancy, second trimester: Secondary | ICD-10-CM | POA: Diagnosis not present

## 2024-05-06 DIAGNOSIS — O283 Abnormal ultrasonic finding on antenatal screening of mother: Secondary | ICD-10-CM

## 2024-05-06 DIAGNOSIS — Z363 Encounter for antenatal screening for malformations: Secondary | ICD-10-CM | POA: Diagnosis not present

## 2024-05-06 DIAGNOSIS — O99312 Alcohol use complicating pregnancy, second trimester: Secondary | ICD-10-CM | POA: Insufficient documentation

## 2024-05-06 DIAGNOSIS — O99342 Other mental disorders complicating pregnancy, second trimester: Secondary | ICD-10-CM

## 2024-05-06 NOTE — Progress Notes (Signed)
 Patient information  Patient Name: Natasha Pope   Patient MRN:   [id number]  Referring practice: MFM Referring Provider: [facility]  Problem List   Patient Active Problem List   Diagnosis Date Noted   Supervision of high risk pregnancy, antepartum 02/12/2024   History of alcohol use disorder 12/25/2023   Cannabis use with anxiety disorder (HCC) 12/25/2023   Parasomnia 12/25/2023   Pruritic dermatitis 12/25/2023   Migraine 03/04/2023   Nausea 03/04/2023   GERD (gastroesophageal reflux disease) 01/21/2023   Financial difficulties 10/04/2022   Obesity (BMI 30-39.9) 04/17/2022   PTSD (post-traumatic stress disorder) 03/19/2022   Auditory hallucinations 01/24/2021   Visual hallucination 01/24/2021   Delusional disorder (HCC)    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/2019   Alcohol use disorder, severe, in early remission, dependence (HCC) 12/27/2018   Recurrent major depressive disorder 12/27/2018   Alcohol use with alcohol-induced mood disorder (HCC)    Nicotine  dependence due to vaping tobacco product 01/31/2018   Asthma 01/31/2018   GAD (generalized anxiety disorder) 01/31/2018   Maternal Fetal Medicine Consult Natasha Pope  is a 28 y.o. G2P0010 at [redacted]w[redacted]d here for ultrasound and consultation. She had low risk aneuploidy screening of a female fetus. Carrier screening was Negative for the basic screening (SMA, alpha-thal, beta-thal, and cystic fibroisis. Maternal serum AFP was negative. She has no acute concerns.   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  I discussed the clinical implications with the patient and the need for referral to a pediatric cardiologist.  We discussed that this typical heart defect is uncommon and that  typically the left side of the heart is affected but the right side can also be affected.  The current defect represents a single ventricular system and requires a series of surgeries.  This delivery typically occurs at a center with the ability to perform cardiac surgery at the time of birth but sometimes this can be deferred until a few months after birth.  We discussed the role of termination which the patient is not interested in.  I also discussed the possibility of an underlying genetic association and the role of amniocentesis which the patient also declined.  There are no other structural abnormalities visualized and this appears to be an isolated defect.  This makes a syndrome or genetic disorder less likely.  Notably the patient did have some alcohol exposure early in pregnancy but this is unlikely to be the cause of her baby's cardiac defect and she has since quit all alcohol use.  She also was exposed to tobacco and nicotine  products but has largely ceased.  Sonographic findings Single intrauterine pregnancy at 22w 5d  Fetal cardiac activity:  Observed and appears normal. Presentation: Breech. 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  The heart is also globally enlarged with a thick myocardium.  There is no evidence of pericardial or pulmonary effusion.  The remainder the fetal anatomy appears normal that was well-visualized today Fetal biometry shows the estimated fetal weight at the 41 percentile.  Amniotic fluid: Within normal limits.  MVP: 5.76 cm. Placenta: Posterior. Adnexa: No abnormality visualized.. Cervical length: 3.8 cm.  Recommendations - Follow-up ultrasound in 2 weeks to check for the  development of hydrops and to answer any further questions patient has - Referral to pediatric cardiology at [facility] since this is the patient's preferred location of  delivery - Serial growth ultrasounds throughout the pregnancy with antenatal testing starting at 32 weeks or sooner if indicated   Review of Systems: A review of systems was performed and was negative except per HPI   Past Obstetrical History:  OB History  Gravida Para Term Preterm AB Living  2 0 0 0 1 0  SAB IAB Ectopic Multiple Live Births  0 1 0 0 0    # Outcome Date GA Lbr Len/2nd Weight Sex Type Anes PTL Lv  2 Current           1 IAB  [redacted]w[redacted]d            Past Medical History:  Past Medical History:  Diagnosis Date   Anxiety    Asthma    Depression    Gastroenteritis 10/22/2022   Nausea, vomiting, and diarrhea x 3 days Secondary dehydration-> dizziness, low blood pressure, dry tongue Bad pain bilateral headache(s) started yesterday, likely from dehydration.  No abdomen pain. No fevers Patient denies any other associated symptom(s)    History of alcohol withdrawal delirium 07/04/2022   We discussed her recent relapse and then withdrawal seizures and she felt 90% certain that the return to alcohol use  was due to manic depression as the main trigger so I sent fyi to [employer] her psychiatrist to consider medication changes based on this, but don't intend to adjust regimen for that. 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 Suspect due to vitamin deficiency Recommended she take daily mvi and continue to abstain from EtOH   Nausea vomiting and diarrhea 06/27/2022   Just for past week or two.   Nicotine  dependence due to vaping tobacco product 01/31/2018   Has been smoking since she was 28 years old.    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   Technique we just overweight by BMI but she puts a lot of the weight on her abdomen so I am documenting this as obesity  is at least truncal obesity therefore I am going to try to send in for Wegov[employer]  and see if insurance will cover as not only would help weight loss but actually will reduce her desire to drink   PTSD (post-traumatic stress disorder) 03/19/2022   Pyelonephritis    Seizure-like activity (HCC)    Seizures (HCC)    Sepsis (HCC)    Snoring 04/17/2022   Substance induced mood disorder (HCC) 12/09/2018   Thrombocytopenia 05/19/2018   Lab Results  Component  Value  Date/Time     PLT  285  08/03/2022 02:36 PM     PLT  133 (L)  06/28/2022 06:11 AM     PLT  186  06/27/2022 05:13 AM     PLT  203  06/26/2022 06:52 PM     PLT  323  03/08/2022 12:07 PM     PLT  247  10/03/2020 04:03 PM         Unwanted pregnancy 11/26/2022   Withdrawal syndrome (HCC) 09/27/2022   Severe depression past 3 days out of proportion to triggers, suspicious for withdrawal from [place]  that she recently  ran out of.  Also recent change of seroquel  considered but we went 50->100 and its helping her sleep and usually helps severe depression       Past Surgical History:    Past Surgical History:  Procedure Laterality Date   dental procedure       Home Medications:   Current Outpatient Medications on File Prior to Visit  Medication Sig Dispense Refill   Blood Pressure Monitoring (BLOOD PRESSURE KIT) DEVI 1 Device by Does not apply route once a week. 1 each 0   busPIRone  (BUSPAR ) 15 MG tablet TAKE ONE TABLET BY MOUTH THREE TIMES DAILY (MUST BE TAKEN CONSISTENTLY) 270 tablet 0   Dextromethorphan -buPROPion  ER (AUVELITY ) 45-105 MG TBCR Take 1 tablet by mouth at bedtime. 90 tablet 3   diphenhydrAMINE  (BENADRYL ) 25 mg capsule Take 1 capsule (25 mg total) by mouth every 6 (six) hours as needed. 30 capsule 0   escitalopram  (LEXAPRO ) 20 MG tablet Take 1 tablet (20 mg total) by mouth at bedtime. 90 tablet 3   levocetirizine (XYZAL ) 5 MG tablet Take 1 tablet (5 mg total) by mouth every evening. 90 tablet 3   metoCLOPramide  (REGLAN ) 10 MG  tablet Take 1 tablet (10 mg total) by mouth every 6 (six) hours as needed for nausea (or heasache). 30 tablet 2   omeprazole  (PRILOSEC) 20 MG capsule Take 1 capsule (20 mg total) by mouth daily. 30 capsule 11   Prenat-FeAsp-Meth-FA-DHA w/o A (PRENATE PIXIE ) 10-0.6-0.4-200 MG CAPS Take 1 tablet by mouth daily. 30 capsule 11   Prenatal Vit-Fe Fumarate-FA (PRENATAL VITAMINS PO) Take 1 tablet by mouth daily.     propranolol  ER (INDERAL  LA) 60 MG 24 hr capsule Take 1 capsule (60 mg total) by mouth daily. 90 capsule 3   QUEtiapine  (SEROQUEL ) 100 MG tablet TAKE ONE-HALF TABLET (50MG ) BY MOUTH ONCE DAILY IN THE MORNING AND ONE AND ONE-HALF TABLET (150MG ) DAILY IN THE EVENING *OK TO TAKE WITH ELEVATE* 90 tablet 0   SUMAtriptan  (IMITREX ) 50 MG tablet Take 1 tablet (50 mg total) by mouth daily as needed for migraine. May repeat in 2 hours if headache persists or recurs. 10 tablet 5   [DISCONTINUED] Dextromethorphan  HBr 15 MG TABS Take 3 tablets by mouth daily at 6 (six) AM. 90 tablet 5   No current facility-administered medications on file prior to visit.      Allergies:   Allergies  Allergen Reactions   Lamotrigine  Hives   Pineapple     Throat swelling and itching     Physical Exam:   Vitals:   05/06/24 0708  BP: (!) 140/77   Sitting comfortably on the sonogram table Nonlabored breathing Normal rate and rhythm Abdomen is nontender  Thank you for the opportunity to be involved with this patient's care. Please let us  know if we can be of any further assistance.   60 minutes of time was spent reviewing the patient's chart including labs, imaging and documentation.  At least 50% of this time was spent with direct patient care discussing the diagnosis, management and prognosis of her care.  Delora Smaller MFM, [place]   05/06/2024  8:30 AM

## 2024-05-12 ENCOUNTER — Inpatient Hospital Stay (HOSPITAL_COMMUNITY)
Admission: AD | Admit: 2024-05-12 | Discharge: 2024-05-12 | Disposition: A | Discharge status: Home / Self Care | Attending: Obstetrics & Gynecology | Admitting: Obstetrics & Gynecology

## 2024-05-12 ENCOUNTER — Encounter (HOSPITAL_COMMUNITY): Payer: Self-pay | Admitting: Obstetrics & Gynecology

## 2024-05-12 ENCOUNTER — Other Ambulatory Visit: Payer: Self-pay

## 2024-05-12 ENCOUNTER — Inpatient Hospital Stay (HOSPITAL_COMMUNITY)

## 2024-05-12 DIAGNOSIS — Z3A23 23 weeks gestation of pregnancy: Secondary | ICD-10-CM

## 2024-05-12 DIAGNOSIS — X501XXA Overexertion from prolonged static or awkward postures, initial encounter: Secondary | ICD-10-CM | POA: Insufficient documentation

## 2024-05-12 DIAGNOSIS — O9A212 Injury, poisoning and certain other consequences of external causes complicating pregnancy, second trimester: Secondary | ICD-10-CM | POA: Insufficient documentation

## 2024-05-12 DIAGNOSIS — W1839XA Other fall on same level, initial encounter: Secondary | ICD-10-CM | POA: Diagnosis not present

## 2024-05-12 DIAGNOSIS — O26892 Other specified pregnancy related conditions, second trimester: Secondary | ICD-10-CM

## 2024-05-12 DIAGNOSIS — R108A3 Suprapubic tenderness: Secondary | ICD-10-CM | POA: Insufficient documentation

## 2024-05-12 DIAGNOSIS — S92354A Nondisplaced fracture of fifth metatarsal bone, right foot, initial encounter for closed fracture: Secondary | ICD-10-CM | POA: Insufficient documentation

## 2024-05-12 LAB — URINALYSIS, ROUTINE W REFLEX MICROSCOPIC
Bilirubin Urine: NEGATIVE
Glucose, UA: NEGATIVE mg/dL
Hgb urine dipstick: NEGATIVE
Ketones, ur: 20 mg/dL — AB
Nitrite: NEGATIVE
Protein, ur: 30 mg/dL — AB
Specific Gravity, Urine: 1.026 (ref 1.005–1.030)
pH: 6 (ref 5.0–8.0)

## 2024-05-12 MED ORDER — CAPSAICIN 0.075 % EX CREA
TOPICAL_CREAM | Freq: Two times a day (BID) | CUTANEOUS | Status: DC
  Filled 2024-05-12: qty 57

## 2024-05-12 MED ORDER — KETOROLAC TROMETHAMINE 60 MG/2ML IM SOLN: 60.0000 mg | Freq: Once | INTRAMUSCULAR | Status: DC

## 2024-05-12 MED ORDER — CYCLOBENZAPRINE HCL 5 MG PO TABS
10.0000 mg | ORAL_TABLET | Freq: Once | ORAL | Status: AC
  Administered 2024-05-12: 10 mg via ORAL
  Filled 2024-05-12: qty 2

## 2024-05-12 MED ORDER — ACETAMINOPHEN 500 MG PO TABS
1000.0000 mg | ORAL_TABLET | Freq: Once | ORAL | Status: AC
  Administered 2024-05-12: 1000 mg via ORAL
  Filled 2024-05-12: qty 2

## 2024-05-12 MED ORDER — CYCLOBENZAPRINE HCL 10 MG PO TABS: 10.0000 mg | ORAL_TABLET | Freq: Three times a day (TID) | ORAL | 0 refills | Status: AC | PRN

## 2024-05-12 NOTE — Discharge Instructions (Addendum)
 You have been referred to:   [facility]     [email or web address] in [place], [place] [address], [place], [place] [address]  [phone]  Please call them for a follow up appointment if they do not call you by the end of this week.
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
## 2024-05-12 NOTE — Progress Notes (Signed)
 Offered patient pain medication and patient declined medication. This nurse let the patient know that if she required the medication later on for pain she could still get medication. X-ray technician at bedside to perform x-ray on patient's ankle.
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
## 2024-05-12 NOTE — Progress Notes (Addendum)
 PT at bedside to wrap patient's ankle and to place boot on patient.This nurse introduced herself to patient and assessed patient's pain and patient rated her pain a 10/10, this nurse stated that she would ask the midwife if she could have something for pain. Other than that patient is resting in patient bed with grandmom at her side in stable condition.

## 2024-05-12 NOTE — MAU Note (Signed)
 Ice placed on pt right ankle - attempt to call PT for ace wrap. Social services offered.

## 2024-05-12 NOTE — Progress Notes (Signed)
 Orthopedic Tech Progress Note Patient Details:  Natasha Pope  Mar 03, 1996 [id number]  Ortho Devices Type of Ortho Device: CAM walker, Ace wrap Ortho Device/Splint Location: RLE Ortho Device/Splint Interventions: Ordered, Application   Post Interventions Patient Tolerated: Well Instructions Provided: Adjustment of device  Sesar Madewell A Moneisha Vosler 05/12/2024, 7:34 PM
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
## 2024-05-19 ENCOUNTER — Other Ambulatory Visit: Payer: Self-pay | Admitting: *Deleted

## 2024-05-19 ENCOUNTER — Ambulatory Visit (HOSPITAL_BASED_OUTPATIENT_CLINIC_OR_DEPARTMENT_OTHER)

## 2024-05-19 ENCOUNTER — Encounter: Payer: Self-pay | Admitting: Family Medicine

## 2024-05-19 ENCOUNTER — Ambulatory Visit: Attending: Maternal & Fetal Medicine | Admitting: Maternal & Fetal Medicine

## 2024-05-19 VITALS — BP 140/74

## 2024-05-19 DIAGNOSIS — O0990 Supervision of high risk pregnancy, unspecified, unspecified trimester: Secondary | ICD-10-CM | POA: Diagnosis not present

## 2024-05-19 DIAGNOSIS — E669 Obesity, unspecified: Secondary | ICD-10-CM

## 2024-05-19 DIAGNOSIS — O9A213 Injury, poisoning and certain other consequences of external causes complicating pregnancy, third trimester: Secondary | ICD-10-CM | POA: Diagnosis not present

## 2024-05-19 DIAGNOSIS — Y929 Unspecified place or not applicable: Secondary | ICD-10-CM | POA: Insufficient documentation

## 2024-05-19 DIAGNOSIS — F419 Anxiety disorder, unspecified: Secondary | ICD-10-CM

## 2024-05-19 DIAGNOSIS — O358XX0 Maternal care for other (suspected) fetal abnormality and damage, not applicable or unspecified: Secondary | ICD-10-CM | POA: Insufficient documentation

## 2024-05-19 DIAGNOSIS — O35BXX0 Maternal care for other (suspected) fetal abnormality and damage, fetal cardiac anomalies, not applicable or unspecified: Secondary | ICD-10-CM | POA: Diagnosis not present

## 2024-05-19 DIAGNOSIS — W06XXXA Fall from bed, initial encounter: Secondary | ICD-10-CM | POA: Insufficient documentation

## 2024-05-19 DIAGNOSIS — Y939 Activity, unspecified: Secondary | ICD-10-CM | POA: Insufficient documentation

## 2024-05-19 DIAGNOSIS — S82892A Other fracture of left lower leg, initial encounter for closed fracture: Secondary | ICD-10-CM | POA: Diagnosis not present

## 2024-05-19 DIAGNOSIS — O283 Abnormal ultrasonic finding on antenatal screening of mother: Secondary | ICD-10-CM

## 2024-05-19 DIAGNOSIS — O99342 Other mental disorders complicating pregnancy, second trimester: Secondary | ICD-10-CM

## 2024-05-19 DIAGNOSIS — O99312 Alcohol use complicating pregnancy, second trimester: Secondary | ICD-10-CM | POA: Diagnosis not present

## 2024-05-19 DIAGNOSIS — Z3A24 24 weeks gestation of pregnancy: Secondary | ICD-10-CM | POA: Diagnosis not present

## 2024-05-19 DIAGNOSIS — O99212 Obesity complicating pregnancy, second trimester: Secondary | ICD-10-CM | POA: Diagnosis not present

## 2024-05-19 DIAGNOSIS — O09892 Supervision of other high risk pregnancies, second trimester: Secondary | ICD-10-CM
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
## 2024-05-20 ENCOUNTER — Encounter: Payer: Self-pay | Admitting: Obstetrics

## 2024-05-20 DIAGNOSIS — M79671 Pain in right foot: Secondary | ICD-10-CM | POA: Diagnosis not present

## 2024-05-21 ENCOUNTER — Encounter: Admitting: Obstetrics and Gynecology

## 2024-05-25 ENCOUNTER — Encounter: Payer: Self-pay | Admitting: Family Medicine

## 2024-05-25 DIAGNOSIS — Z654 Victim of crime and terrorism: Secondary | ICD-10-CM | POA: Insufficient documentation

## 2024-05-25 DIAGNOSIS — S92353A Displaced fracture of fifth metatarsal bone, unspecified foot, initial encounter for closed fracture: Secondary | ICD-10-CM | POA: Insufficient documentation
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
## 2024-05-26 ENCOUNTER — Ambulatory Visit (INDEPENDENT_AMBULATORY_CARE_PROVIDER_SITE_OTHER): Admitting: Advanced Practice Midwife

## 2024-05-26 ENCOUNTER — Other Ambulatory Visit: Payer: Self-pay

## 2024-05-26 VITALS — BP 121/88 | HR 76 | Wt 195.3 lb

## 2024-05-26 DIAGNOSIS — O35BXX0 Maternal care for other (suspected) fetal abnormality and damage, fetal cardiac anomalies, not applicable or unspecified: Secondary | ICD-10-CM

## 2024-05-26 DIAGNOSIS — F331 Major depressive disorder, recurrent, moderate: Secondary | ICD-10-CM

## 2024-05-26 DIAGNOSIS — O0992 Supervision of high risk pregnancy, unspecified, second trimester: Secondary | ICD-10-CM | POA: Diagnosis not present

## 2024-05-26 DIAGNOSIS — F1021 Alcohol dependence, in remission: Secondary | ICD-10-CM

## 2024-05-26 DIAGNOSIS — S92354A Nondisplaced fracture of fifth metatarsal bone, right foot, initial encounter for closed fracture: Secondary | ICD-10-CM

## 2024-05-26 DIAGNOSIS — F411 Generalized anxiety disorder: Secondary | ICD-10-CM | POA: Diagnosis not present

## 2024-05-26 DIAGNOSIS — Z654 Victim of crime and terrorism: Secondary | ICD-10-CM | POA: Diagnosis not present

## 2024-05-26 DIAGNOSIS — O35BXX1 Maternal care for other (suspected) fetal abnormality and damage, fetal cardiac anomalies, fetus 1: Secondary | ICD-10-CM | POA: Diagnosis not present

## 2024-05-26 DIAGNOSIS — Q2112 Patent foramen ovale: Secondary | ICD-10-CM | POA: Diagnosis not present

## 2024-05-26 DIAGNOSIS — O0990 Supervision of high risk pregnancy, unspecified, unspecified trimester: Secondary | ICD-10-CM

## 2024-05-26 DIAGNOSIS — Z3A25 25 weeks gestation of pregnancy: Secondary | ICD-10-CM | POA: Diagnosis not present
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
## 2024-05-26 NOTE — Patient Instructions (Addendum)
 [facility]  [address] [place], [place] [address] [phone] [email or web address]  [facility]:  [address], 2nd floor, [place], [place] [address] [phone])  Main line [phone]  [place] location **Parking is available in the [address] parking deck.  [place]:  [address] [place], [place] [address] [phone]) Main line [phone]  [place] location  **Located on the backside of the [facility] [place]. Limited parking is available off of [address]  [place] hours: Monday -Friday 8:30am-4:30pm  [email or web address]    If immediate emergency, call 9-1-1, or [facility] [place] 24 hour crisis hotline at: (812)035-7212  [facility] [address], [place], [place] [address] [phone] **temporary housing for victims of domestic violence  [place]:  [facility] [employer]  [address], [place], [place] [address] [phone]  http://helpincorporated.org  Monday-Friday, 8:30am-5:00pm      TDaP Vaccine Pregnancy Get the Whooping Cough Vaccine While You Are Pregnant (CDC)  It is important for women to get the whooping cough vaccine in the third trimester of each pregnancy. Vaccines are the best way to prevent this disease. There are 2 different whooping cough vaccines. Both vaccines combine protection against whooping cough, tetanus and diphtheria, but they are for different age groups: Tdap: for everyone 11 years or older, including pregnant women  DTaP: for children 2 months through 77 years of age  You need the whooping cough vaccine during each of your pregnancies The recommended time to get the shot is during your 27th through 36th week of pregnancy, preferably during the earlier part of this time  period. The Centers for Disease Control and Prevention (CDC) recommends that pregnant women receive the whooping cough vaccine for adolescents and adults (called Tdap vaccine) during the third trimester of each pregnancy. The recommended time to get the shot is during your 27th through 36th week of pregnancy, preferably during the earlier part of this time period. This replaces the original recommendation that pregnant women get the vaccine only if they had not previously received it. The [employer] of Obstetricians and Gynecologists and the [employer] support this recommendation.  You should get the whooping cough vaccine while pregnant to pass protection to your baby frame support disabled and/or not supported in this browser  Learn why Leita decided to get the whooping cough vaccine in her 3rd trimester of pregnancy and how her baby girl was born with some protection against the disease. Also available on YouTube. After receiving the whooping cough vaccine, your body will create protective antibodies (proteins produced by the body to fight off diseases) and pass some of them to your baby before birth. These antibodies provide your baby some short-term protection against whooping cough in early life. These antibodies can also protect your baby from some of the more serious complications that come along with whooping cough. Your protective antibodies are at their highest about 2 weeks after getting the vaccine, but it takes time to pass them to your baby. So the preferred time to get the whooping cough vaccine is early in your third trimester. The amount of whooping cough antibodies in your body decreases over time. That is why CDC recommends you get a whooping cough vaccine during each pregnancy. Doing so allows each of your babies to get the greatest number of protective antibodies from you. This means each of  your babies will get the best protection possible against this  disease.  Getting the whooping cough vaccine while pregnant is better than getting the vaccine after you give birth Whooping cough vaccination during pregnancy is ideal so your baby will have short-term protection as soon as he is born. This early protection is important because your baby will not start getting his whooping cough vaccines until he is 2 months old. These first few months of life are when your baby is at greatest risk for catching whooping cough. This is also when he's at greatest risk for having severe, potentially life-threating complications from the infection. To avoid that gap in protection, it is best to get a whooping cough vaccine during pregnancy. You will then pass protection to your baby before he is born. To continue protecting your baby, he should get whooping cough vaccines starting at 2 months old. You may never have gotten the Tdap vaccine before and did not get it during this pregnancy. If so, you should make sure to get the vaccine immediately after you give birth, before leaving the hospital or birthing center. It will take about 2 weeks before your body develops protection (antibodies) in response to the vaccine. Once you have protection from the vaccine, you are less likely to give whooping cough to your newborn while caring for him. But remember, your baby will still be at risk for catching whooping cough from others. A recent study looked to see how effective Tdap was at preventing whooping cough in babies whose mothers got the vaccine while pregnant or in the hospital after giving birth. The study found that getting Tdap between 27 through 36 weeks of pregnancy is 85% more effective at preventing whooping cough in babies younger than 2 months old. Blood tests cannot tell if you need a whooping cough vaccine There are no blood tests that can tell you if you have enough antibodies in your body to protect yourself or your baby against whooping cough. Even if you have been  sick with whooping cough in the past or previously received the vaccine, you still should get the vaccine during each pregnancy. Breastfeeding may pass some protective antibodies onto your baby By breastfeeding, you may pass some antibodies you have made in response to the vaccine to your baby. When you get a whooping cough vaccine during your pregnancy, you will have antibodies in your breast milk that you can share with your baby as soon as your milk comes in. However, your baby will not get protective antibodies immediately if you wait to get the whooping cough vaccine until after delivering your baby. This is because it takes about 2 weeks for your body to create antibodies. Learn more about the health benefits of breastfeeding.

## 2024-06-03 ENCOUNTER — Other Ambulatory Visit: Payer: Self-pay | Admitting: *Deleted

## 2024-06-03 ENCOUNTER — Ambulatory Visit: Attending: Maternal & Fetal Medicine | Admitting: Maternal & Fetal Medicine

## 2024-06-03 ENCOUNTER — Ambulatory Visit

## 2024-06-03 VITALS — BP 145/92 | HR 79

## 2024-06-03 DIAGNOSIS — O283 Abnormal ultrasonic finding on antenatal screening of mother: Secondary | ICD-10-CM

## 2024-06-03 DIAGNOSIS — O99212 Obesity complicating pregnancy, second trimester: Secondary | ICD-10-CM | POA: Diagnosis not present

## 2024-06-03 DIAGNOSIS — O358XX0 Maternal care for other (suspected) fetal abnormality and damage, not applicable or unspecified: Secondary | ICD-10-CM

## 2024-06-03 DIAGNOSIS — Z3A26 26 weeks gestation of pregnancy: Secondary | ICD-10-CM | POA: Diagnosis not present

## 2024-06-03 DIAGNOSIS — O99322 Drug use complicating pregnancy, second trimester: Secondary | ICD-10-CM | POA: Insufficient documentation

## 2024-06-03 DIAGNOSIS — O99332 Smoking (tobacco) complicating pregnancy, second trimester: Secondary | ICD-10-CM | POA: Diagnosis not present

## 2024-06-03 DIAGNOSIS — O09891 Supervision of other high risk pregnancies, first trimester: Secondary | ICD-10-CM | POA: Diagnosis not present

## 2024-06-03 DIAGNOSIS — O99342 Other mental disorders complicating pregnancy, second trimester: Secondary | ICD-10-CM

## 2024-06-03 DIAGNOSIS — F419 Anxiety disorder, unspecified: Secondary | ICD-10-CM | POA: Diagnosis not present

## 2024-06-03 DIAGNOSIS — O0990 Supervision of high risk pregnancy, unspecified, unspecified trimester: Secondary | ICD-10-CM | POA: Diagnosis not present

## 2024-06-03 DIAGNOSIS — O35BXX0 Maternal care for other (suspected) fetal abnormality and damage, fetal cardiac anomalies, not applicable or unspecified: Secondary | ICD-10-CM | POA: Diagnosis not present

## 2024-06-03 DIAGNOSIS — F1290 Cannabis use, unspecified, uncomplicated: Secondary | ICD-10-CM | POA: Diagnosis not present

## 2024-06-03 DIAGNOSIS — E669 Obesity, unspecified: Secondary | ICD-10-CM

## 2024-06-03 DIAGNOSIS — Z362 Encounter for other antenatal screening follow-up: Secondary | ICD-10-CM | POA: Diagnosis not present
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
## 2024-06-08 ENCOUNTER — Other Ambulatory Visit: Payer: Self-pay

## 2024-06-08 DIAGNOSIS — Z3A27 27 weeks gestation of pregnancy: Secondary | ICD-10-CM

## 2024-06-09 ENCOUNTER — Other Ambulatory Visit

## 2024-06-09 ENCOUNTER — Other Ambulatory Visit: Payer: Self-pay

## 2024-06-09 ENCOUNTER — Ambulatory Visit (INDEPENDENT_AMBULATORY_CARE_PROVIDER_SITE_OTHER): Admitting: Family Medicine

## 2024-06-09 VITALS — BP 138/87 | HR 102 | Wt 194.8 lb

## 2024-06-09 DIAGNOSIS — F1021 Alcohol dependence, in remission: Secondary | ICD-10-CM | POA: Diagnosis not present

## 2024-06-09 DIAGNOSIS — O0990 Supervision of high risk pregnancy, unspecified, unspecified trimester: Secondary | ICD-10-CM

## 2024-06-09 DIAGNOSIS — Z654 Victim of crime and terrorism: Secondary | ICD-10-CM

## 2024-06-09 DIAGNOSIS — F411 Generalized anxiety disorder: Secondary | ICD-10-CM | POA: Diagnosis not present

## 2024-06-09 DIAGNOSIS — E559 Vitamin D deficiency, unspecified: Secondary | ICD-10-CM

## 2024-06-09 DIAGNOSIS — Z23 Encounter for immunization: Secondary | ICD-10-CM | POA: Diagnosis not present

## 2024-06-09 DIAGNOSIS — O0992 Supervision of high risk pregnancy, unspecified, second trimester: Secondary | ICD-10-CM | POA: Diagnosis not present

## 2024-06-09 DIAGNOSIS — Z3A27 27 weeks gestation of pregnancy: Secondary | ICD-10-CM

## 2024-06-09 DIAGNOSIS — O35BXX0 Maternal care for other (suspected) fetal abnormality and damage, fetal cardiac anomalies, not applicable or unspecified: Secondary | ICD-10-CM | POA: Diagnosis not present

## 2024-06-09 DIAGNOSIS — E538 Deficiency of other specified B group vitamins: Secondary | ICD-10-CM

## 2024-06-09 DIAGNOSIS — S92354A Nondisplaced fracture of fifth metatarsal bone, right foot, initial encounter for closed fracture: Secondary | ICD-10-CM

## 2024-06-09 MED ORDER — VITAMIN B-12 1000 MCG PO TABS: 1000.0000 ug | ORAL_TABLET | Freq: Every day | ORAL | 2 refills | Status: AC

## 2024-06-09 NOTE — Addendum Note (Signed)
 Addended by: JOSHUA METH C on: 06/09/2024 11:15 AM   Modules accepted: Orders

## 2024-06-09 NOTE — Patient Instructions (Signed)
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
## 2024-06-10 DIAGNOSIS — S92901A Unspecified fracture of right foot, initial encounter for closed fracture: Secondary | ICD-10-CM | POA: Diagnosis not present

## 2024-06-10 DIAGNOSIS — M79671 Pain in right foot: Secondary | ICD-10-CM | POA: Diagnosis not present

## 2024-06-10 NOTE — Telephone Encounter (Signed)
 [facility].  Responded to MyChart message sent yesterday. Natasha Pope receives OB care in [place] with   Discussed referral from primary OB to [facility] MFM and [facility] will facilitate appts for ultrasound and MFM. She has ROB in 1-2 weeks Offered tour of [facility] when she comes to [facility] in [place]. Describes being very excited about baby Natasha Pope and can't wait to meet him at delivery.   [facility] P[facility]atal [facility] Infant Outpatient Care Coordinator [facility] [phone] [email or web address]

## 2024-06-11 ENCOUNTER — Emergency Department (HOSPITAL_COMMUNITY)

## 2024-06-11 ENCOUNTER — Other Ambulatory Visit

## 2024-06-11 ENCOUNTER — Other Ambulatory Visit: Payer: Self-pay

## 2024-06-11 ENCOUNTER — Emergency Department (HOSPITAL_COMMUNITY): Admission: EM | Admit: 2024-06-11 | Discharge: 2024-06-11 | Disposition: A | Discharge status: Home / Self Care

## 2024-06-11 ENCOUNTER — Encounter (HOSPITAL_COMMUNITY): Payer: Self-pay

## 2024-06-11 DIAGNOSIS — I10 Essential (primary) hypertension: Secondary | ICD-10-CM | POA: Insufficient documentation

## 2024-06-11 DIAGNOSIS — J45909 Unspecified asthma, uncomplicated: Secondary | ICD-10-CM | POA: Insufficient documentation

## 2024-06-11 DIAGNOSIS — O26892 Other specified pregnancy related conditions, second trimester: Secondary | ICD-10-CM | POA: Diagnosis not present

## 2024-06-11 DIAGNOSIS — S42002A Fracture of unspecified part of left clavicle, initial encounter for closed fracture: Secondary | ICD-10-CM | POA: Diagnosis not present

## 2024-06-11 DIAGNOSIS — O9A213 Injury, poisoning and certain other consequences of external causes complicating pregnancy, third trimester: Secondary | ICD-10-CM | POA: Diagnosis not present

## 2024-06-11 DIAGNOSIS — Z3A28 28 weeks gestation of pregnancy: Secondary | ICD-10-CM | POA: Diagnosis not present

## 2024-06-11 DIAGNOSIS — F17290 Nicotine dependence, other tobacco product, uncomplicated: Secondary | ICD-10-CM | POA: Insufficient documentation

## 2024-06-11 DIAGNOSIS — S0993XA Unspecified injury of face, initial encounter: Secondary | ICD-10-CM

## 2024-06-11 DIAGNOSIS — O9A212 Injury, poisoning and certain other consequences of external causes complicating pregnancy, second trimester: Secondary | ICD-10-CM | POA: Insufficient documentation

## 2024-06-11 DIAGNOSIS — S0081XA Abrasion of other part of head, initial encounter: Secondary | ICD-10-CM | POA: Diagnosis not present

## 2024-06-11 DIAGNOSIS — Z79899 Other long term (current) drug therapy: Secondary | ICD-10-CM | POA: Diagnosis not present

## 2024-06-11 DIAGNOSIS — R140 Abdominal distension (gaseous): Secondary | ICD-10-CM | POA: Insufficient documentation

## 2024-06-11 DIAGNOSIS — S59902A Unspecified injury of left elbow, initial encounter: Secondary | ICD-10-CM | POA: Diagnosis not present

## 2024-06-11 DIAGNOSIS — S0083XA Contusion of other part of head, initial encounter: Secondary | ICD-10-CM | POA: Diagnosis not present

## 2024-06-11 LAB — CBC WITH DIFFERENTIAL/PLATELET
Abs Immature Granulocytes: 0.05 K/uL (ref 0.00–0.07)
Basophils Absolute: 0 K/uL (ref 0.0–0.1)
Basophils Relative: 0 %
Eosinophils Absolute: 0.1 K/uL (ref 0.0–0.5)
Eosinophils Relative: 1 %
HCT: 38.9 % (ref 36.0–46.0)
Hemoglobin: 12.6 g/dL (ref 12.0–15.0)
Immature Granulocytes: 1 %
Lymphocytes Relative: 32 %
Lymphs Abs: 2.9 K/uL (ref 0.7–4.0)
MCH: 30.1 pg (ref 26.0–34.0)
MCHC: 32.4 g/dL (ref 30.0–36.0)
MCV: 93.1 fL (ref 80.0–100.0)
Monocytes Absolute: 0.5 K/uL (ref 0.1–1.0)
Monocytes Relative: 5 %
Neutro Abs: 5.4 K/uL (ref 1.7–7.7)
Neutrophils Relative %: 61 %
Platelets: 233 K/uL (ref 150–400)
RBC: 4.18 MIL/uL (ref 3.87–5.11)
RDW: 13.7 % (ref 11.5–15.5)
WBC: 9 K/uL (ref 4.0–10.5)
nRBC: 0 % (ref 0.0–0.2)

## 2024-06-11 LAB — COMPREHENSIVE METABOLIC PANEL WITH GFR
ALT: 13 U/L (ref 0–44)
AST: 19 U/L (ref 15–41)
Albumin: 3.3 g/dL — ABNORMAL LOW (ref 3.5–5.0)
Alkaline Phosphatase: 93 U/L (ref 38–126)
Anion gap: 20 — ABNORMAL HIGH (ref 5–15)
BUN: <5 mg/dL — ABNORMAL LOW (ref 6–20)
CO2: 16 mmol/L — ABNORMAL LOW (ref 22–32)
Calcium: 8.8 mg/dL — ABNORMAL LOW (ref 8.9–10.3)
Chloride: 104 mmol/L (ref 98–111)
Creatinine, Ser: 0.54 mg/dL (ref 0.44–1.00)
GFR, Estimated: >60 mL/min (ref >60)
Glucose, Bld: 88 mg/dL (ref 70–99)
Potassium: 3.7 mmol/L (ref 3.5–5.1)
Sodium: 140 mmol/L (ref 135–145)
Total Bilirubin: 0.2 mg/dL (ref 0.0–1.2)
Total Protein: 6.7 g/dL (ref 6.5–8.1)

## 2024-06-11 LAB — DIC (DISSEMINATED INTRAVASCULAR COAGULATION)PANEL
D-Dimer, Quant: 0.75 ug{FEU}/mL — ABNORMAL HIGH (ref 0.00–0.50)
Fibrinogen: 465 mg/dL (ref 210–475)
INR: 1 (ref 0.8–1.2)
Platelets: 232 K/uL (ref 150–400)
Prothrombin Time: 14 s (ref 11.4–15.2)
Smear Review: NONE SEEN
aPTT: 27 s (ref 24–36)

## 2024-06-11 LAB — MAGNESIUM: Magnesium: 1.7 mg/dL (ref 1.7–2.4)

## 2024-06-11 MED ORDER — METOCLOPRAMIDE HCL 5 MG/ML IJ SOLN
10.0000 mg | Freq: Once | INTRAMUSCULAR | Status: AC
  Administered 2024-06-11: 10 mg via INTRAVENOUS
  Filled 2024-06-11: qty 2

## 2024-06-11 MED ORDER — LACTATED RINGERS IV BOLUS
1000.0000 mL | Freq: Once | INTRAVENOUS | Status: AC
  Administered 2024-06-11: 1000 mL via INTRAVENOUS

## 2024-06-11 MED ORDER — DIPHENHYDRAMINE HCL 50 MG/ML IJ SOLN
25.0000 mg | Freq: Once | INTRAMUSCULAR | Status: AC
  Administered 2024-06-11: 25 mg via INTRAVENOUS
  Filled 2024-06-11: qty 1

## 2024-06-11 MED ORDER — ACETAMINOPHEN 500 MG PO TABS
1000.0000 mg | ORAL_TABLET | Freq: Once | ORAL | Status: AC
  Administered 2024-06-11: 1000 mg via ORAL
  Filled 2024-06-11: qty 2

## 2024-06-11 NOTE — Progress Notes (Signed)
 Attempting to get baby on EFM. Can audibly hear FHR intermittently, however baby is very active at this time, requiring repositioning of EFM to obtain tracing.

## 2024-06-11 NOTE — ED Notes (Signed)
 Patient to CT.
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
## 2024-06-11 NOTE — Progress Notes (Signed)
 Pt requesting to leave now, despite being educated on the need for EFM for 24hrs. Requesting to leave AMA - Dr Erik notifying ED MD.

## 2024-06-11 NOTE — Discharge Instructions (Signed)
 I would recommend following up with OB GYN in outpatient setting for reevaluation as you did not undergo monitoring while here.
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
## 2024-06-12 LAB — KLEIHAUER-BETKE STAIN
Fetal Cells %: 0 %
Quantitation Fetal Hemoglobin: 0 mL

## 2024-06-13 LAB — ABO/RH: ABO/RH(D): O POS

## 2024-06-16 ENCOUNTER — Ambulatory Visit

## 2024-06-16 ENCOUNTER — Encounter: Admitting: Family Medicine

## 2024-06-16 DIAGNOSIS — O0990 Supervision of high risk pregnancy, unspecified, unspecified trimester: Secondary | ICD-10-CM

## 2024-06-17 ENCOUNTER — Other Ambulatory Visit

## 2024-06-23 ENCOUNTER — Encounter: Admitting: Family Medicine

## 2024-06-23 ENCOUNTER — Other Ambulatory Visit

## 2024-06-25 ENCOUNTER — Ambulatory Visit: Attending: Maternal & Fetal Medicine

## 2024-06-25 ENCOUNTER — Encounter: Admitting: Obstetrics and Gynecology

## 2024-06-25 ENCOUNTER — Ambulatory Visit (HOSPITAL_BASED_OUTPATIENT_CLINIC_OR_DEPARTMENT_OTHER): Admitting: Maternal & Fetal Medicine

## 2024-06-25 VITALS — BP 131/90 | HR 101

## 2024-06-25 DIAGNOSIS — O368390 Maternal care for abnormalities of the fetal heart rate or rhythm, unspecified trimester, not applicable or unspecified: Secondary | ICD-10-CM | POA: Insufficient documentation

## 2024-06-25 DIAGNOSIS — O99342 Other mental disorders complicating pregnancy, second trimester: Secondary | ICD-10-CM | POA: Diagnosis not present

## 2024-06-25 DIAGNOSIS — O368330 Maternal care for abnormalities of the fetal heart rate or rhythm, third trimester, not applicable or unspecified: Secondary | ICD-10-CM | POA: Diagnosis not present

## 2024-06-25 DIAGNOSIS — O0990 Supervision of high risk pregnancy, unspecified, unspecified trimester: Secondary | ICD-10-CM | POA: Insufficient documentation

## 2024-06-25 DIAGNOSIS — O35BXX0 Maternal care for other (suspected) fetal abnormality and damage, fetal cardiac anomalies, not applicable or unspecified: Secondary | ICD-10-CM

## 2024-06-25 DIAGNOSIS — Z3A29 29 weeks gestation of pregnancy: Secondary | ICD-10-CM

## 2024-06-25 DIAGNOSIS — E669 Obesity, unspecified: Secondary | ICD-10-CM | POA: Diagnosis not present

## 2024-06-25 DIAGNOSIS — O09892 Supervision of other high risk pregnancies, second trimester: Secondary | ICD-10-CM | POA: Diagnosis not present

## 2024-06-25 DIAGNOSIS — O162 Unspecified maternal hypertension, second trimester: Secondary | ICD-10-CM | POA: Insufficient documentation

## 2024-06-25 DIAGNOSIS — F419 Anxiety disorder, unspecified: Secondary | ICD-10-CM | POA: Diagnosis not present

## 2024-06-25 DIAGNOSIS — O99212 Obesity complicating pregnancy, second trimester: Secondary | ICD-10-CM | POA: Diagnosis not present
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
## 2024-06-29 ENCOUNTER — Other Ambulatory Visit: Payer: Self-pay

## 2024-06-29 DIAGNOSIS — S92901A Unspecified fracture of right foot, initial encounter for closed fracture: Secondary | ICD-10-CM | POA: Diagnosis not present

## 2024-06-29 DIAGNOSIS — O0990 Supervision of high risk pregnancy, unspecified, unspecified trimester: Secondary | ICD-10-CM

## 2024-06-30 ENCOUNTER — Other Ambulatory Visit

## 2024-06-30 ENCOUNTER — Encounter: Admitting: Advanced Practice Midwife

## 2024-07-01 ENCOUNTER — Ambulatory Visit

## 2024-07-01 ENCOUNTER — Encounter: Payer: Self-pay | Admitting: Family Medicine

## 2024-07-01 ENCOUNTER — Telehealth: Payer: Self-pay | Admitting: Family Medicine

## 2024-07-01 ENCOUNTER — Other Ambulatory Visit

## 2024-07-01 DIAGNOSIS — O0990 Supervision of high risk pregnancy, unspecified, unspecified trimester: Secondary | ICD-10-CM

## 2024-07-01 NOTE — Telephone Encounter (Signed)
 Hey I got a call from [facility] MFM [name] Boone her direct number is [phone]. She said that she needs a referral and medical records for this patient to be able to deliver with [facility]. She said that Nathalia's cardio doctor recommended this. The fax number for them is [phone].
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
## 2024-07-02 ENCOUNTER — Encounter: Payer: Self-pay | Admitting: Advanced Practice Midwife

## 2024-07-02 ENCOUNTER — Ambulatory Visit

## 2024-07-02 ENCOUNTER — Other Ambulatory Visit: Payer: Self-pay | Admitting: Maternal & Fetal Medicine

## 2024-07-02 ENCOUNTER — Other Ambulatory Visit: Payer: Self-pay | Admitting: *Deleted

## 2024-07-02 ENCOUNTER — Telehealth: Payer: Self-pay | Admitting: Nurse Practitioner

## 2024-07-02 ENCOUNTER — Telehealth: Payer: Self-pay

## 2024-07-02 ENCOUNTER — Other Ambulatory Visit: Payer: Self-pay | Admitting: Advanced Practice Midwife

## 2024-07-02 ENCOUNTER — Ambulatory Visit: Attending: Obstetrics and Gynecology | Admitting: Obstetrics and Gynecology

## 2024-07-02 VITALS — BP 151/107 | HR 96

## 2024-07-02 VITALS — BP 143/96 | HR 92

## 2024-07-02 DIAGNOSIS — O99212 Obesity complicating pregnancy, second trimester: Secondary | ICD-10-CM | POA: Diagnosis not present

## 2024-07-02 DIAGNOSIS — O368390 Maternal care for abnormalities of the fetal heart rate or rhythm, unspecified trimester, not applicable or unspecified: Secondary | ICD-10-CM | POA: Diagnosis not present

## 2024-07-02 DIAGNOSIS — O365930 Maternal care for other known or suspected poor fetal growth, third trimester, not applicable or unspecified: Secondary | ICD-10-CM

## 2024-07-02 DIAGNOSIS — E669 Obesity, unspecified: Secondary | ICD-10-CM | POA: Diagnosis not present

## 2024-07-02 DIAGNOSIS — R110 Nausea: Secondary | ICD-10-CM | POA: Diagnosis not present

## 2024-07-02 DIAGNOSIS — R030 Elevated blood-pressure reading, without diagnosis of hypertension: Secondary | ICD-10-CM | POA: Diagnosis not present

## 2024-07-02 DIAGNOSIS — O368330 Maternal care for abnormalities of the fetal heart rate or rhythm, third trimester, not applicable or unspecified: Secondary | ICD-10-CM | POA: Diagnosis not present

## 2024-07-02 DIAGNOSIS — O35BXX0 Maternal care for other (suspected) fetal abnormality and damage, fetal cardiac anomalies, not applicable or unspecified: Secondary | ICD-10-CM

## 2024-07-02 DIAGNOSIS — Z3A30 30 weeks gestation of pregnancy: Secondary | ICD-10-CM | POA: Diagnosis not present

## 2024-07-02 DIAGNOSIS — O0990 Supervision of high risk pregnancy, unspecified, unspecified trimester: Secondary | ICD-10-CM

## 2024-07-02 DIAGNOSIS — O26893 Other specified pregnancy related conditions, third trimester: Secondary | ICD-10-CM | POA: Diagnosis not present

## 2024-07-02 DIAGNOSIS — O99342 Other mental disorders complicating pregnancy, second trimester: Secondary | ICD-10-CM | POA: Diagnosis not present

## 2024-07-02 DIAGNOSIS — F419 Anxiety disorder, unspecified: Secondary | ICD-10-CM | POA: Diagnosis not present

## 2024-07-02 NOTE — Progress Notes (Signed)
 Maternal-Fetal Medicine Consultation  Name: Natasha Pope   MRN: [id number]  GA: G2P0010 [redacted]w[redacted]d   Fetal cardiac anomaly.  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  Patient will be delivering at [facility].  Blood pressure today at our office where 151/77 and repeat 143/96 mmHg.  Patient complains of nausea.  No severe headache or visual disturbances or right upper quadrant pain.  Ultrasound Estimated fetal weight is at the 3rd percentile and the abdominal circumference measurement at the 21st percentile.  Head circumference measurement is at between -2 and -1 SD (normal).  Normal amniotic fluid. Umbilical artery Doppler showed normal forward diastolic flow. Right ventricular hypertrophy with no atrioventricular flow is seen.  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  Three-vessel view shows a narrow pulmonary artery with antegrade flow.  LVOT appears normal.  Fetal growth restriction I explained the finding of fetal growth restriction that is difficult to differentiate from a constitutionally small for gestational age fetus.   I discussed the possible causes of fetal growth restriction including placental insufficiency (most common cause), chromosomal anomalies and fetal infections.  Patient did not give history of fever or rashes.  I discussed ultrasound protocol of monitoring fetal growth restriction.  R/o Preeclampsia Patient had 2 increased blood pressures and reports having nausea.  I recommended that she be evaluated at the hospital (MPA).  I discussed the possibility of preeclampsia and dehydration of present that should be corrected. Patient informed that she will be going over to the MAU.  MAU team was informed.  Recommendations -Appointments were made for weekly antenatal testing. - Patient was encouraged to follow-up with [facility] appointments.     Consultation including  face-to-face (more than 50%) counseling 30 minutes.
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
## 2024-07-02 NOTE — Telephone Encounter (Signed)
 PC/  To Patient verified x 2 ( HIPAA Compliant)  Patient stated she didn't feel well and wanted to go home and lay down and statedI didn't feel like going to the hospital If I feel worse, I will come I asked the patient if she was okay or needed any assistance and she stated I'm fine.  I also asked the patient if she was able to keep her scheduled appointments at [facility] on 07/06/24 with MFM and she stated No, I will need to reschedule them. Patient then hung up the phone. ----------------------------------------------------------------------------------- [name] Dalton, MSN, [facility], Center for [facility]

## 2024-07-02 NOTE — Progress Notes (Signed)
 Attempted phone contact with patient x2 to confirm she is planning on going to MAU. VM left for patient.

## 2024-07-03 ENCOUNTER — Other Ambulatory Visit

## 2024-07-03 ENCOUNTER — Ambulatory Visit

## 2024-07-03 ENCOUNTER — Other Ambulatory Visit: Payer: Self-pay

## 2024-07-03 DIAGNOSIS — O0990 Supervision of high risk pregnancy, unspecified, unspecified trimester: Secondary | ICD-10-CM

## 2024-07-03 DIAGNOSIS — O365930 Maternal care for other known or suspected poor fetal growth, third trimester, not applicable or unspecified: Secondary | ICD-10-CM

## 2024-07-03 NOTE — Telephone Encounter (Signed)
Erroneous encounter, duplicate.

## 2024-07-04 ENCOUNTER — Other Ambulatory Visit: Payer: Self-pay | Admitting: Internal Medicine

## 2024-07-04 DIAGNOSIS — F4001 Agoraphobia with panic disorder: Secondary | ICD-10-CM

## 2024-07-04 DIAGNOSIS — F331 Major depressive disorder, recurrent, moderate: Secondary | ICD-10-CM

## 2024-07-04 DIAGNOSIS — F411 Generalized anxiety disorder: Secondary | ICD-10-CM

## 2024-07-06 ENCOUNTER — Other Ambulatory Visit: Payer: Self-pay

## 2024-07-06 ENCOUNTER — Other Ambulatory Visit

## 2024-07-06 DIAGNOSIS — O0990 Supervision of high risk pregnancy, unspecified, unspecified trimester: Secondary | ICD-10-CM

## 2024-07-07 ENCOUNTER — Other Ambulatory Visit: Payer: Self-pay

## 2024-07-07 ENCOUNTER — Encounter: Admitting: Family Medicine

## 2024-07-07 DIAGNOSIS — Z3A31 31 weeks gestation of pregnancy: Secondary | ICD-10-CM

## 2024-07-07 DIAGNOSIS — O0990 Supervision of high risk pregnancy, unspecified, unspecified trimester: Secondary | ICD-10-CM

## 2024-07-08 ENCOUNTER — Ambulatory Visit: Payer: Self-pay | Admitting: Family Medicine

## 2024-07-08 DIAGNOSIS — O0990 Supervision of high risk pregnancy, unspecified, unspecified trimester: Secondary | ICD-10-CM

## 2024-07-08 LAB — CBC
Hematocrit: 35.7 % (ref 34.0–46.6)
Hemoglobin: 11.7 g/dL (ref 11.1–15.9)
MCH: 30.7 pg (ref 26.6–33.0)
MCHC: 32.8 g/dL (ref 31.5–35.7)
MCV: 94 fL (ref 79–97)
Platelets: 200 x10E3/uL (ref 150–450)
RBC: 3.81 x10E6/uL (ref 3.77–5.28)
RDW: 15.2 % (ref 11.7–15.4)
WBC: 7.7 x10E3/uL (ref 3.4–10.8)

## 2024-07-08 LAB — SYPHILIS: RPR W/REFLEX TO RPR TITER AND TREPONEMAL ANTIBODIES, TRADITIONAL SCREENING AND DIAGNOSIS ALGORITHM: RPR Ser Ql: NONREACTIVE

## 2024-07-08 LAB — GLUCOSE TOLERANCE, 2 HOURS W/ 1HR
Glucose, 1 hour: 119 mg/dL (ref 70–179)
Glucose, 2 hour: 81 mg/dL (ref 70–152)
Glucose, Fasting: 87 mg/dL (ref 70–91)

## 2024-07-08 LAB — HIV ANTIBODY (ROUTINE TESTING W REFLEX): HIV Screen 4th Generation wRfx: NONREACTIVE
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
## 2024-07-09 ENCOUNTER — Other Ambulatory Visit: Payer: Self-pay | Admitting: Internal Medicine

## 2024-07-09 DIAGNOSIS — F5105 Insomnia due to other mental disorder: Secondary | ICD-10-CM

## 2024-07-10 DIAGNOSIS — O35BXX0 Maternal care for other (suspected) fetal abnormality and damage, fetal cardiac anomalies, not applicable or unspecified: Secondary | ICD-10-CM | POA: Diagnosis not present

## 2024-07-13 ENCOUNTER — Other Ambulatory Visit

## 2024-07-13 ENCOUNTER — Other Ambulatory Visit (HOSPITAL_COMMUNITY): Payer: Self-pay

## 2024-07-13 ENCOUNTER — Ambulatory Visit

## 2024-07-13 ENCOUNTER — Telehealth: Payer: Self-pay

## 2024-07-13 NOTE — Telephone Encounter (Signed)
 Pharmacy Patient Advocate Encounter   Received notification from Onbase that prior authorization for QUEtiapine  Fumarate 100MG  tablets is required/requested.   Insurance verification completed.   The patient is insured through [facility] MEDICAID.   Per test claim: PA required; PA submitted to above mentioned insurance via Latent Key/confirmation #/EOC [id number] Status is pending

## 2024-07-13 NOTE — Telephone Encounter (Signed)
 Pharmacy Patient Advocate Encounter  Received notification from [facility] MEDICAID that Prior Authorization for QUEtiapine  Fumarate 100MG  tablets  has been APPROVED from 07/13/24 to 07/13/25. Ran test claim, Copay is $4.00. This test claim was processed through [facility]- copay amounts may vary at other pharmacies due to pharmacy/plan contracts, or as the patient moves through the different stages of their insurance plan.   PA #/Case ID/Reference #: [id number]

## 2024-07-20 NOTE — Telephone Encounter (Signed)
------------------------------------------------------------------------------- °  Summary: Appointment -------------------------------------------------------------------------------  Patient Natasha Pope  was contacted today regarding getting her scheduled for a BPP on 1/7 in [place]. Voicemail was left for patient with information to call back.

## 2024-07-29 ENCOUNTER — Other Ambulatory Visit

## 2024-07-29 ENCOUNTER — Ambulatory Visit

## 2024-07-29 LAB — VISTARA: REPORT SUMMARY: NEGATIVE

## 2024-08-07 ENCOUNTER — Other Ambulatory Visit: Payer: Self-pay | Admitting: Internal Medicine

## 2024-08-07 DIAGNOSIS — F99 Mental disorder, not otherwise specified: Secondary | ICD-10-CM

## 2024-08-21 ENCOUNTER — Other Ambulatory Visit: Payer: Self-pay | Admitting: Internal Medicine

## 2024-08-21 DIAGNOSIS — F5105 Insomnia due to other mental disorder: Secondary | ICD-10-CM
